# Patient Record
Sex: Female | Born: 1965 | Race: White | Hispanic: No | Smoking: Never smoker
Health system: Southern US, Community
[De-identification: ages and names within clinical notes are randomized; demographics above are authoritative.]

## PROBLEM LIST (undated history)

## (undated) DIAGNOSIS — J309 Allergic rhinitis, unspecified: Secondary | ICD-10-CM

## (undated) DIAGNOSIS — F419 Anxiety disorder, unspecified: Secondary | ICD-10-CM

## (undated) DIAGNOSIS — L501 Idiopathic urticaria: Secondary | ICD-10-CM

## (undated) DIAGNOSIS — G629 Polyneuropathy, unspecified: Secondary | ICD-10-CM

## (undated) DIAGNOSIS — M1990 Unspecified osteoarthritis, unspecified site: Secondary | ICD-10-CM

## (undated) DIAGNOSIS — E282 Polycystic ovarian syndrome: Secondary | ICD-10-CM

## (undated) DIAGNOSIS — IMO0002 Reserved for concepts with insufficient information to code with codable children: Secondary | ICD-10-CM

## (undated) DIAGNOSIS — T753XXA Motion sickness, initial encounter: Secondary | ICD-10-CM

## (undated) DIAGNOSIS — Z9889 Other specified postprocedural states: Secondary | ICD-10-CM

## (undated) DIAGNOSIS — T4145XA Adverse effect of unspecified anesthetic, initial encounter: Secondary | ICD-10-CM

## (undated) DIAGNOSIS — Z8659 Personal history of other mental and behavioral disorders: Secondary | ICD-10-CM

## (undated) DIAGNOSIS — R42 Dizziness and giddiness: Secondary | ICD-10-CM

## (undated) DIAGNOSIS — R600 Localized edema: Secondary | ICD-10-CM

## (undated) DIAGNOSIS — L4050 Arthropathic psoriasis, unspecified: Secondary | ICD-10-CM

## (undated) DIAGNOSIS — Z8489 Family history of other specified conditions: Secondary | ICD-10-CM

## (undated) DIAGNOSIS — M109 Gout, unspecified: Secondary | ICD-10-CM

## (undated) DIAGNOSIS — R011 Cardiac murmur, unspecified: Secondary | ICD-10-CM

## (undated) DIAGNOSIS — L509 Urticaria, unspecified: Secondary | ICD-10-CM

## (undated) DIAGNOSIS — E785 Hyperlipidemia, unspecified: Secondary | ICD-10-CM

## (undated) DIAGNOSIS — D649 Anemia, unspecified: Secondary | ICD-10-CM

## (undated) DIAGNOSIS — K219 Gastro-esophageal reflux disease without esophagitis: Secondary | ICD-10-CM

## (undated) DIAGNOSIS — R609 Edema, unspecified: Secondary | ICD-10-CM

## (undated) DIAGNOSIS — Z87442 Personal history of urinary calculi: Secondary | ICD-10-CM

## (undated) DIAGNOSIS — R112 Nausea with vomiting, unspecified: Secondary | ICD-10-CM

## (undated) DIAGNOSIS — I059 Rheumatic mitral valve disease, unspecified: Secondary | ICD-10-CM

## (undated) DIAGNOSIS — K259 Gastric ulcer, unspecified as acute or chronic, without hemorrhage or perforation: Secondary | ICD-10-CM

## (undated) DIAGNOSIS — G43909 Migraine, unspecified, not intractable, without status migrainosus: Secondary | ICD-10-CM

## (undated) DIAGNOSIS — F329 Major depressive disorder, single episode, unspecified: Secondary | ICD-10-CM

## (undated) DIAGNOSIS — C801 Malignant (primary) neoplasm, unspecified: Secondary | ICD-10-CM

## (undated) DIAGNOSIS — D039 Melanoma in situ, unspecified: Secondary | ICD-10-CM

## (undated) DIAGNOSIS — E063 Autoimmune thyroiditis: Secondary | ICD-10-CM

## (undated) DIAGNOSIS — E039 Hypothyroidism, unspecified: Secondary | ICD-10-CM

## (undated) DIAGNOSIS — E538 Deficiency of other specified B group vitamins: Secondary | ICD-10-CM

## (undated) DIAGNOSIS — I10 Essential (primary) hypertension: Secondary | ICD-10-CM

## (undated) DIAGNOSIS — T8859XA Other complications of anesthesia, initial encounter: Secondary | ICD-10-CM

## (undated) DIAGNOSIS — E079 Disorder of thyroid, unspecified: Secondary | ICD-10-CM

## (undated) DIAGNOSIS — T7840XA Allergy, unspecified, initial encounter: Secondary | ICD-10-CM

## (undated) DIAGNOSIS — F32A Depression, unspecified: Secondary | ICD-10-CM

## (undated) HISTORY — DX: Disorder of thyroid, unspecified: E07.9

## (undated) HISTORY — PX: KNEE ARTHROSCOPY: SHX127

## (undated) HISTORY — DX: Major depressive disorder, single episode, unspecified: F32.9

## (undated) HISTORY — DX: Gastro-esophageal reflux disease without esophagitis: K21.9

## (undated) HISTORY — DX: Polyneuropathy, unspecified: G62.9

## (undated) HISTORY — DX: Hyperlipidemia, unspecified: E78.5

## (undated) HISTORY — DX: Gastric ulcer, unspecified as acute or chronic, without hemorrhage or perforation: K25.9

## (undated) HISTORY — DX: Edema, unspecified: R60.9

## (undated) HISTORY — DX: Essential (primary) hypertension: I10

## (undated) HISTORY — DX: Cardiac murmur, unspecified: R01.1

## (undated) HISTORY — DX: Melanoma in situ, unspecified: D03.9

## (undated) HISTORY — DX: Migraine, unspecified, not intractable, without status migrainosus: G43.909

## (undated) HISTORY — DX: Unspecified osteoarthritis, unspecified site: M19.90

## (undated) HISTORY — DX: Anxiety disorder, unspecified: F41.9

## (undated) HISTORY — DX: Malignant (primary) neoplasm, unspecified: C80.1

## (undated) HISTORY — DX: Allergy, unspecified, initial encounter: T78.40XA

## (undated) HISTORY — DX: Depression, unspecified: F32.A

## (undated) HISTORY — DX: Hypothyroidism, unspecified: E03.9

## (undated) HISTORY — DX: Deficiency of other specified B group vitamins: E53.8

## (undated) HISTORY — DX: Polycystic ovarian syndrome: E28.2

## (undated) HISTORY — PX: ROTATOR CUFF REPAIR: SHX139

## (undated) HISTORY — DX: Personal history of other mental and behavioral disorders: Z86.59

## (undated) HISTORY — DX: Reserved for concepts with insufficient information to code with codable children: IMO0002

---

## 1992-04-01 HISTORY — PX: FOOT SURGERY: SHX648

## 2001-04-01 HISTORY — PX: CHOLECYSTECTOMY: SHX55

## 2003-04-08 ENCOUNTER — Other Ambulatory Visit: Admission: RE | Admit: 2003-04-08 | Discharge: 2003-04-08 | Payer: Self-pay | Admitting: Obstetrics and Gynecology

## 2003-09-02 ENCOUNTER — Other Ambulatory Visit: Admission: RE | Admit: 2003-09-02 | Discharge: 2003-09-02 | Payer: Self-pay | Admitting: Obstetrics and Gynecology

## 2004-08-22 ENCOUNTER — Ambulatory Visit: Payer: Self-pay

## 2005-01-16 ENCOUNTER — Ambulatory Visit: Payer: Self-pay

## 2005-04-01 HISTORY — PX: BREAST BIOPSY: SHX20

## 2006-07-23 ENCOUNTER — Ambulatory Visit: Payer: Self-pay | Admitting: Internal Medicine

## 2006-11-02 ENCOUNTER — Ambulatory Visit: Payer: Self-pay | Admitting: Emergency Medicine

## 2006-11-06 ENCOUNTER — Ambulatory Visit: Payer: Self-pay | Admitting: Obstetrics and Gynecology

## 2006-11-17 ENCOUNTER — Ambulatory Visit: Payer: Self-pay | Admitting: Obstetrics and Gynecology

## 2007-05-12 ENCOUNTER — Ambulatory Visit: Payer: Self-pay | Admitting: General Surgery

## 2007-08-17 ENCOUNTER — Ambulatory Visit: Payer: Self-pay | Admitting: Family Medicine

## 2007-11-19 ENCOUNTER — Ambulatory Visit: Payer: Self-pay | Admitting: Obstetrics and Gynecology

## 2008-04-01 HISTORY — PX: LAPAROSCOPIC GASTRIC BANDING: SHX1100

## 2008-04-14 ENCOUNTER — Ambulatory Visit: Payer: Self-pay | Admitting: General Practice

## 2008-05-20 ENCOUNTER — Ambulatory Visit: Payer: Self-pay | Admitting: Orthopedic Surgery

## 2008-05-20 ENCOUNTER — Ambulatory Visit: Payer: Self-pay | Admitting: Cardiology

## 2008-05-26 ENCOUNTER — Ambulatory Visit: Payer: Self-pay | Admitting: Orthopedic Surgery

## 2008-05-31 ENCOUNTER — Encounter: Payer: Self-pay | Admitting: Orthopedic Surgery

## 2008-08-24 ENCOUNTER — Ambulatory Visit: Payer: Self-pay | Admitting: Family Medicine

## 2008-12-14 ENCOUNTER — Ambulatory Visit: Payer: Self-pay | Admitting: Obstetrics and Gynecology

## 2009-04-01 HISTORY — PX: GASTRIC BYPASS: SHX52

## 2009-10-03 ENCOUNTER — Ambulatory Visit: Payer: Self-pay | Admitting: Nurse Practitioner

## 2010-02-14 ENCOUNTER — Ambulatory Visit: Payer: Self-pay | Admitting: Obstetrics and Gynecology

## 2010-04-19 LAB — BASIC METABOLIC PANEL
Potassium: 3.9 mmol/L (ref 3.4–5.3)
Sodium: 137 mmol/L (ref 137–147)

## 2011-02-27 ENCOUNTER — Ambulatory Visit: Payer: Self-pay | Admitting: Obstetrics and Gynecology

## 2011-03-11 ENCOUNTER — Ambulatory Visit: Payer: Self-pay | Admitting: Obstetrics and Gynecology

## 2011-07-19 DIAGNOSIS — Z91018 Allergy to other foods: Secondary | ICD-10-CM | POA: Insufficient documentation

## 2012-03-03 ENCOUNTER — Ambulatory Visit: Payer: Self-pay | Admitting: Obstetrics and Gynecology

## 2012-07-28 ENCOUNTER — Encounter: Payer: Self-pay | Admitting: Internal Medicine

## 2012-07-31 ENCOUNTER — Ambulatory Visit (INDEPENDENT_AMBULATORY_CARE_PROVIDER_SITE_OTHER): Payer: 59 | Admitting: Internal Medicine

## 2012-07-31 ENCOUNTER — Encounter: Payer: Self-pay | Admitting: Internal Medicine

## 2012-07-31 VITALS — BP 110/70 | HR 95 | Temp 98.5°F | Ht 67.0 in | Wt 219.8 lb

## 2012-07-31 DIAGNOSIS — Z9109 Other allergy status, other than to drugs and biological substances: Secondary | ICD-10-CM

## 2012-07-31 DIAGNOSIS — I10 Essential (primary) hypertension: Secondary | ICD-10-CM

## 2012-07-31 DIAGNOSIS — E790 Hyperuricemia without signs of inflammatory arthritis and tophaceous disease: Secondary | ICD-10-CM

## 2012-07-31 DIAGNOSIS — R7989 Other specified abnormal findings of blood chemistry: Secondary | ICD-10-CM

## 2012-07-31 DIAGNOSIS — E039 Hypothyroidism, unspecified: Secondary | ICD-10-CM

## 2012-07-31 DIAGNOSIS — D039 Melanoma in situ, unspecified: Secondary | ICD-10-CM

## 2012-07-31 DIAGNOSIS — E7800 Pure hypercholesterolemia, unspecified: Secondary | ICD-10-CM

## 2012-07-31 DIAGNOSIS — E282 Polycystic ovarian syndrome: Secondary | ICD-10-CM

## 2012-07-31 DIAGNOSIS — F329 Major depressive disorder, single episode, unspecified: Secondary | ICD-10-CM

## 2012-07-31 DIAGNOSIS — G43909 Migraine, unspecified, not intractable, without status migrainosus: Secondary | ICD-10-CM

## 2012-07-31 DIAGNOSIS — R5381 Other malaise: Secondary | ICD-10-CM

## 2012-07-31 DIAGNOSIS — Z9884 Bariatric surgery status: Secondary | ICD-10-CM

## 2012-07-31 DIAGNOSIS — R5383 Other fatigue: Secondary | ICD-10-CM

## 2012-07-31 DIAGNOSIS — K219 Gastro-esophageal reflux disease without esophagitis: Secondary | ICD-10-CM

## 2012-07-31 DIAGNOSIS — C439 Malignant melanoma of skin, unspecified: Secondary | ICD-10-CM

## 2012-08-02 ENCOUNTER — Encounter: Payer: Self-pay | Admitting: Internal Medicine

## 2012-08-02 DIAGNOSIS — E039 Hypothyroidism, unspecified: Secondary | ICD-10-CM | POA: Insufficient documentation

## 2012-08-02 DIAGNOSIS — E7800 Pure hypercholesterolemia, unspecified: Secondary | ICD-10-CM | POA: Insufficient documentation

## 2012-08-02 DIAGNOSIS — G43909 Migraine, unspecified, not intractable, without status migrainosus: Secondary | ICD-10-CM | POA: Insufficient documentation

## 2012-08-02 DIAGNOSIS — K219 Gastro-esophageal reflux disease without esophagitis: Secondary | ICD-10-CM | POA: Insufficient documentation

## 2012-08-02 DIAGNOSIS — F320 Major depressive disorder, single episode, mild: Secondary | ICD-10-CM | POA: Insufficient documentation

## 2012-08-02 DIAGNOSIS — Z980 Intestinal bypass and anastomosis status: Secondary | ICD-10-CM | POA: Insufficient documentation

## 2012-08-02 DIAGNOSIS — D039 Melanoma in situ, unspecified: Secondary | ICD-10-CM | POA: Insufficient documentation

## 2012-08-02 DIAGNOSIS — Z9109 Other allergy status, other than to drugs and biological substances: Secondary | ICD-10-CM | POA: Insufficient documentation

## 2012-08-02 DIAGNOSIS — E282 Polycystic ovarian syndrome: Secondary | ICD-10-CM | POA: Insufficient documentation

## 2012-08-02 DIAGNOSIS — I10 Essential (primary) hypertension: Secondary | ICD-10-CM | POA: Insufficient documentation

## 2012-08-02 NOTE — Assessment & Plan Note (Signed)
On omeprazole.  

## 2012-08-02 NOTE — Assessment & Plan Note (Signed)
Top weight was 307.  Continue diet adjustment and exercise.  Follow.    

## 2012-08-02 NOTE — Progress Notes (Signed)
Subjective:    Patient ID: Lori Le, female    DOB: May 01, 1965, 47 y.o.   MRN: [id number]  HPI 47 year old female with past history of hypercholesterolemia, hypothyroidism and depression.  She has a documented history of hypertension, but states her blood pressure has been doing well lately.  Is on two fluid pills for "fluid accumulation".  States she has tried to get off of these and has issues with swelling - when off these medications.  She comes in today to follow up on these issues as well as to establish care.  Previously has seen Dr Mickey Farber.  Sees Dr Logan Bores for her GYN exams.  Last pelvic/pap 12/2011.  Previously had an abnormal pap.  Is s/p colposcopy.  States everything checked out fine.  Pap smears since have been normal.  Sees Dr Renae Fickle for her thyroid issues.  Has issues with allergies.  Has seen Dr Jenne Campus.  Takes zyrtec prn.  Has had allergy testing.  She has a history of migraines.  Has visual auras (tunnel vision and bright lights).  Will then notice headache about 20 minutes later.  These are doing better.  Not a significant issue for her now.  She has had gastric bypass.  Initially had lap band.  Had persistent emesis, so band removed and she had bypass.  Top weight was 307 pounds.  She does exercise.  Walks.  Her weight fluctuates.  A history of depression.  Doing well now.  Overall she feels she is doing relatively well.     Past Medical History  Diagnosis Date  . Hypertension   . Hyperlipidemia   . Allergy   . GERD (gastroesophageal reflux disease)   . Arthritis   . Melanoma in situ     left shoulder  . Depression   . History of eating disorder   . Migraine   . Hypothyroidism   . PCOS (polycystic ovarian syndrome)     Outpatient Encounter Prescriptions as of 07/31/2012  Medication Sig Dispense Refill  . furosemide (LASIX) 40 MG tablet Take 40 mg by mouth daily.      [name] levothyroxine (SYNTHROID, LEVOTHROID) 150 MCG tablet Take 150 mcg by mouth daily. Take one a day,  & two on Sunday      . norgestimate-ethinyl estradiol (ORTHO-CYCLEN,SPRINTEC,PREVIFEM) 0.25-35 MG-MCG tablet Take 1 tablet by mouth daily.      [name] omeprazole (PRILOSEC) 20 MG capsule Take 20 mg by mouth daily.      [name] triamterene-hydrochlorothiazide (MAXZIDE-25) 37.5-25 MG per tablet Take 1 tablet by mouth daily.      . Vitamin D, Ergocalciferol, (DRISDOL) 50000 UNITS CAPS Take 50,000 Units by mouth every 30 (thirty) days.       No facility-administered encounter medications on file as of 07/31/2012.    Review of Systems Patient denies any headache, lightheadedness or dizziness now.  Does have a history of migraines as outlined.  No chest pain, tightness or palpitations.  No cardiac symptoms with increased activity or exertion.  No increased shortness of breath, cough or congestion.  Allergy symptoms relatively well controlled currently.   No nausea or vomiting.  Takes omeprazole.  No increased acid reflux reported.  No abdominal pain or cramping.  No bowel change, such as diarrhea, constipation, BRBPR or melana.  No urine change.  No significant depression.        Objective:   Physical Exam Filed Vitals:   07/31/12 1428  BP: 110/70  Pulse: 95  Temp: 98.5 F (36.9 C)  Pulse recheck 68, 23  47 year old female in no acute distress.   HEENT:  Nares- clear.  Oropharynx - without lesions. NECK:  Supple.  Nontender.  No audible bruit.  HEART:  Appears to be regular. LUNGS:  No crackles or wheezing audible.  Respirations even and unlabored.  RADIAL PULSE:  Equal bilaterally.  BREASTS:  Performed by GYN.  ABDOMEN:  Soft, nontender.  Bowel sounds present and normal.  No audible abdominal bruit.  GU:  Performed by Dr Logan Bores.  EXTREMITIES:  No increased edema present.  DP pulses palpable and equal bilaterally.      NEURO:  No focal neuro deficits noted.      Assessment & Plan:  ELEVATED URIC ACID LEVEL.  Noted on recent labs.  She is on triam/hctz.  Never had gout.  Recheck.    HEALTH  MAINTENANCE.  Had her breast, pelvic and pap smear through GYN (Dr Logan Bores).  Last pap 12/11/11.  States was ok.  Had her mammogram 12/13 - ok (per her report).    I spent 45 minutes with this patient and more than 50% of the time was spent in consultation regarding the above.

## 2012-08-02 NOTE — Assessment & Plan Note (Signed)
On synthroid.  Sees Dr Paul.  Follow.   

## 2012-08-02 NOTE — Assessment & Plan Note (Signed)
Low cholesterol diet and exercise.  Check lipid panel with next labs.  

## 2012-08-02 NOTE — Assessment & Plan Note (Signed)
Doing well on no medication.  Follow.  

## 2012-08-02 NOTE — Assessment & Plan Note (Signed)
States her blood pressure has improved since her weight loss.  On Triam/HCTZ and lasix.  Check metabolic panel.  Follow.

## 2012-08-02 NOTE — Assessment & Plan Note (Signed)
Has migraines as outlined.  Not a significant issue for her now.  Follow.   

## 2012-08-02 NOTE — Assessment & Plan Note (Signed)
Followed by Dr Cheree Ditto.  Up to date with skin checks.

## 2012-08-02 NOTE — Assessment & Plan Note (Signed)
Doing well.  Takes zyrtec prn.  Has seen Dr McQueen.   

## 2012-08-02 NOTE — Assessment & Plan Note (Signed)
On Sprintec.  Followed by GYN.

## 2012-08-13 ENCOUNTER — Other Ambulatory Visit (INDEPENDENT_AMBULATORY_CARE_PROVIDER_SITE_OTHER): Payer: 59

## 2012-08-13 DIAGNOSIS — E790 Hyperuricemia without signs of inflammatory arthritis and tophaceous disease: Secondary | ICD-10-CM

## 2012-08-13 DIAGNOSIS — E7800 Pure hypercholesterolemia, unspecified: Secondary | ICD-10-CM

## 2012-08-13 DIAGNOSIS — R7989 Other specified abnormal findings of blood chemistry: Secondary | ICD-10-CM

## 2012-08-13 DIAGNOSIS — R5383 Other fatigue: Secondary | ICD-10-CM

## 2012-08-13 LAB — CBC WITH DIFFERENTIAL/PLATELET
Basophils Relative: 0.5 % (ref 0.0–3.0)
Eosinophils Relative: 1.1 % (ref 0.0–5.0)
HCT: 36.4 % (ref 36.0–46.0)
Lymphs Abs: 2.2 10*3/uL (ref 0.7–4.0)
Monocytes Relative: 6.2 % (ref 3.0–12.0)
Neutrophils Relative %: 63.8 % (ref 43.0–77.0)
Platelets: 235 10*3/uL (ref 150.0–400.0)
RBC: 4.37 Mil/uL (ref 3.87–5.11)
WBC: 7.7 10*3/uL (ref 4.5–10.5)

## 2012-08-13 LAB — COMPREHENSIVE METABOLIC PANEL
Albumin: 3.5 g/dL (ref 3.5–5.2)
CO2: 32 mEq/L (ref 19–32)
Calcium: 8.8 mg/dL (ref 8.4–10.5)
GFR: 114.11 mL/min (ref >60.00)
Glucose, Bld: 83 mg/dL (ref 70–99)
Potassium: 3.9 mEq/L (ref 3.5–5.1)
Sodium: 139 mEq/L (ref 135–145)
Total Protein: 6.4 g/dL (ref 6.0–8.3)

## 2012-08-13 LAB — LIPID PANEL: Cholesterol: 184 mg/dL (ref 0–200)

## 2012-08-16 ENCOUNTER — Other Ambulatory Visit: Payer: Self-pay | Admitting: Internal Medicine

## 2012-08-16 NOTE — Progress Notes (Signed)
Opened in error

## 2012-08-18 ENCOUNTER — Telehealth: Payer: Self-pay | Admitting: Internal Medicine

## 2012-08-18 NOTE — Telephone Encounter (Signed)
Pt informed to increase HCTZ to a whole tablet & that she will need to be evaluated if swelling persist or if she has other sx's

## 2012-08-18 NOTE — Telephone Encounter (Signed)
The note to decrease her triam/hctz is on her lab result note.  I am ok if she increases her triam/hctz back to one whole tablet per day, but if leg swelling - needs eval.  If any acute sx, redness, tenderness, sob - needs eval.  Will also need f/u with me to discuss changing her meds.

## 2012-08-18 NOTE — Telephone Encounter (Signed)
Patient Information:  Caller Name: Mystie  Phone: [phone]  Patient: Lori Le  Gender: Female  DOB: [birth year]  Age: 47 Years  PCP: Dale Duncan  Pregnant: No  Office Follow Up:  Does the office need to follow up with this patient?: Yes  Instructions For The Office: PLEASE CALL PT TODAY AND LET HER KNOW IF SHE SHOULD RESUME 1 WHOLE TRIAM/Hctz A DAY BECAUSE OF SWELLING. COULD NOT VERIFY SWITCH TO LOWER DOSE IN EPIC.  RN Note:  PLEASE CALL PT TODAY AND LET HER KNOW IF SHE SHOULD RESUME 1 WHOLE TRIAM/Hctz A DAY BECAUSE OF SWELLING. COULD NOT VERIFY SWITCH TO LOWER DOSE IN EPIC.  Symptoms  Reason For Call & Symptoms: Pt calling to ask if she can continue whole HCTZ , instead of half that MD changed her to on 08/17/12, because of the excess fluid in lower extremities. No note in Epic of this change. Pt states she had a 6-7 pt wt gain 5/19 pm from that am and pitting edema was "Bad". Much better this am (5/20) after elevating, but wt is still 1 lb more than 5/19 am. Pt denies any symptoms: chest pain, SOB, or pain in extremities.  Reviewed Health History In EMR: Yes  Reviewed Medications In EMR: Yes  Reviewed Allergies In EMR: Yes  Reviewed Surgeries / Procedures: Yes  Date of Onset of Symptoms: 08/17/2012  Treatments Tried: elevated feet  Treatments Tried Worked: Yes OB / GYN:  LMP: 07/19/2012  Guideline(s) Used:  Leg Swelling and Edema  Disposition Per Guideline:   See Today in Office  Reason For Disposition Reached:   Moderate swelling of both ankles (e.g., swelling extends up to the knees) AND new onset or worsening  Advice Given:  Call Back If:  Swelling becomes worse  Swelling becomes red or painful to the touch  Calf pain occurs and becomes constant  You become worse.  Patient Will Follow Care Advice:  YES

## 2012-08-28 ENCOUNTER — Encounter: Payer: Self-pay | Admitting: Internal Medicine

## 2012-08-28 ENCOUNTER — Ambulatory Visit (INDEPENDENT_AMBULATORY_CARE_PROVIDER_SITE_OTHER): Payer: 59 | Admitting: Internal Medicine

## 2012-08-28 VITALS — BP 120/70 | HR 76 | Temp 98.8°F | Ht 67.0 in | Wt 219.2 lb

## 2012-08-28 DIAGNOSIS — D039 Melanoma in situ, unspecified: Secondary | ICD-10-CM

## 2012-08-28 DIAGNOSIS — G43909 Migraine, unspecified, not intractable, without status migrainosus: Secondary | ICD-10-CM

## 2012-08-28 DIAGNOSIS — Z9884 Bariatric surgery status: Secondary | ICD-10-CM

## 2012-08-28 DIAGNOSIS — I10 Essential (primary) hypertension: Secondary | ICD-10-CM

## 2012-08-28 DIAGNOSIS — E039 Hypothyroidism, unspecified: Secondary | ICD-10-CM

## 2012-08-28 DIAGNOSIS — C439 Malignant melanoma of skin, unspecified: Secondary | ICD-10-CM

## 2012-08-28 DIAGNOSIS — F329 Major depressive disorder, single episode, unspecified: Secondary | ICD-10-CM

## 2012-08-28 MED ORDER — FUROSEMIDE 40 MG PO TABS: 40.0000 mg | ORAL_TABLET | Freq: Every day | ORAL | Status: DC

## 2012-08-28 MED ORDER — TRIAMTERENE-HCTZ 37.5-25 MG PO TABS: 1.0000 | ORAL_TABLET | Freq: Every day | ORAL | Status: DC

## 2012-08-28 MED ORDER — OMEPRAZOLE 20 MG PO CPDR: 20.0000 mg | DELAYED_RELEASE_CAPSULE | Freq: Every day | ORAL | Status: DC

## 2012-08-30 ENCOUNTER — Encounter: Payer: Self-pay | Admitting: Internal Medicine

## 2012-08-30 NOTE — Progress Notes (Signed)
Subjective:    Patient ID: Lori Le, female    DOB: Jun 14, 1965, 47 y.o.   MRN: [id number]  HPI 47 year old female with past history of hypercholesterolemia, hypothyroidism and depression.  She has a documented history of hypertension, but states her blood pressure has been doing well lately.  Is on two fluid pills for "fluid accumulation".  States she has tried to get off of these and has issues with swelling - when off these medications.  She comes in today for a scheduled follow up.  Last visit, I tried to cut back her triam/hctz secondary to an elevated uric acid level.  She reported increased swelling only after a few days of being off the medication.  Back on now.  She went to [employer] with [employer] and had a massage for lymphedema.  She states that she noticed decreased swelling and increased urine output after the massage.  Feels better.  Planning to go for another massage today.   Sees Dr Logan Bores for her GYN exams.  Last pelvic/pap 12/2011.  Previously had an abnormal pap.  Is s/p colposcopy.  States everything checked out fine.  Pap smears since have been normal.  Sees Dr Renae Fickle for her thyroid issues.  Has issues with allergies.  Has seen Dr Jenne Campus.  Takes zyrtec prn.  Has had allergy testing.  She has a history of migraines.  Has visual auras (tunnel vision and bright lights).  Will then notice headache about 20 minutes later.  These are doing better.  Not a significant issue for her now.  She has had gastric bypass.  Initially had lap band.  Had persistent emesis, so band removed and she had bypass.  Top weight was 307 pounds.  She does exercise.  Walks.   A history of depression.  Doing well now.  Overall she feels she is doing relatively well.     Past Medical History  Diagnosis Date  . Hypertension   . Hyperlipidemia   . Allergy   . GERD (gastroesophageal reflux disease)   . Arthritis   . Melanoma in situ     left shoulder  . Depression   . History of eating  disorder   . Migraine   . Hypothyroidism   . PCOS (polycystic ovarian syndrome)     Outpatient Encounter Prescriptions as of 08/28/2012  Medication Sig Dispense Refill  . furosemide (LASIX) 40 MG tablet Take 1 tablet (40 mg total) by mouth daily.  90 tablet  1  . levothyroxine (SYNTHROID, LEVOTHROID) 150 MCG tablet Take 150 mcg by mouth daily. Take one a day, & two on Sunday      . norgestimate-ethinyl estradiol (ORTHO-CYCLEN,SPRINTEC,PREVIFEM) 0.25-35 MG-MCG tablet Take 1 tablet by mouth daily.      [name] omeprazole (PRILOSEC) 20 MG capsule Take 1 capsule (20 mg total) by mouth daily.  90 capsule  1  . triamterene-hydrochlorothiazide (MAXZIDE-25) 37.5-25 MG per tablet Take 1 tablet by mouth daily.  90 tablet  1  . Vitamin D, Ergocalciferol, (DRISDOL) 50000 UNITS CAPS Take 50,000 Units by mouth every 30 (thirty) days.      . [DISCONTINUED] furosemide (LASIX) 40 MG tablet Take 40 mg by mouth daily.      . [DISCONTINUED] omeprazole (PRILOSEC) 20 MG capsule Take 20 mg by mouth daily.      . [DISCONTINUED] triamterene-hydrochlorothiazide (MAXZIDE-25) 37.5-25 MG per tablet Take 1 tablet by mouth daily.       No facility-administered encounter medications on file as  of 08/28/2012.    Review of Systems Patient denies any headache, lightheadedness or dizziness now.  Does have a history of migraines as outlined.  No chest pain, tightness or palpitations.  No cardiac symptoms with increased activity or exertion.  No increased shortness of breath, cough or congestion.  Allergy symptoms relatively well controlled currently.   No nausea or vomiting.  Takes omeprazole.  No increased acid reflux reported.  No abdominal pain or cramping.  No bowel change, such as diarrhea, constipation, BRBPR or melana.  No urine change.  No significant depression.  Swelling better now as outlined.        Objective:   Physical Exam  Filed Vitals:   08/28/12 1619  BP: 120/70  Pulse: 76  Temp: 98.8 F (37.1 C)   47 year  old female in no acute distress.   HEENT:  Nares- clear.  Oropharynx - without lesions. NECK:  Supple.  Nontender.  No audible bruit.  HEART:  Appears to be regular. LUNGS:  No crackles or wheezing audible.  Respirations even and unlabored.  RADIAL PULSE:  Equal bilaterally.  ABDOMEN:  Soft, nontender.  Bowel sounds present and normal.  No audible abdominal bruit.  EXTREMITIES:  No increased edema present.  DP pulses palpable and equal bilaterally.          Assessment & Plan:  ELEVATED URIC ACID LEVEL.  Noted on recent labs.  She is on triam/hctz.  Never had gout.  Given that she had significant improvement with the massage, will try to decrease the triam/hctz to 1/2 tablet again.  Follow.  If problems, may need to try and use lasix instead and adjust dosing.     HEALTH MAINTENANCE.  Had her breast, pelvic and pap smear through GYN (Dr Logan Bores).  Last pap 12/11/11.  States was ok.  Had her mammogram 12/13 - ok (per her report).

## 2012-08-30 NOTE — Assessment & Plan Note (Signed)
Top weight was 307.  Continue diet adjustment and exercise.  Follow.

## 2012-08-30 NOTE — Assessment & Plan Note (Signed)
On synthroid.  Sees Dr Paul.  Follow.   

## 2012-08-30 NOTE — Assessment & Plan Note (Signed)
Has migraines as outlined.  Not a significant issue for her now.  Follow.   

## 2012-08-30 NOTE — Assessment & Plan Note (Signed)
States her blood pressure has improved since her weight loss.  On Triam/HCTZ and lasix.  Adjust medication as outlined.  Follow.

## 2012-08-30 NOTE — Assessment & Plan Note (Addendum)
Followed by Dr Cheree Ditto.  Up to date with skin checks.  Will discuss with her at her next visit if cxr,etc warranted.

## 2012-08-30 NOTE — Assessment & Plan Note (Signed)
Doing well on no medication.  Follow.  

## 2012-12-16 ENCOUNTER — Encounter: Payer: Self-pay | Admitting: Adult Health

## 2012-12-16 ENCOUNTER — Ambulatory Visit (INDEPENDENT_AMBULATORY_CARE_PROVIDER_SITE_OTHER): Payer: 59 | Admitting: Adult Health

## 2012-12-16 VITALS — BP 120/78 | HR 88 | Temp 98.2°F | Resp 12 | Ht 67.0 in | Wt 224.0 lb

## 2012-12-16 DIAGNOSIS — J329 Chronic sinusitis, unspecified: Secondary | ICD-10-CM

## 2012-12-16 MED ORDER — PSEUDOEPHEDRINE-GUAIFENESIN ER 60-600 MG PO TB12: 1.0000 | ORAL_TABLET | Freq: Two times a day (BID) | ORAL | Status: DC

## 2012-12-16 MED ORDER — AMOXICILLIN-POT CLAVULANATE 875-125 MG PO TABS: 1.0000 | ORAL_TABLET | Freq: Two times a day (BID) | ORAL | Status: DC

## 2012-12-16 NOTE — Assessment & Plan Note (Signed)
Start Augmentin one tablet twice a day for 10 days. Mucinex D. as directed. RTC if symptoms are not improved within 3-4 days.

## 2012-12-16 NOTE — Patient Instructions (Addendum)
Start Augmenting twice daily for 10 days.  Mucinex D as directed.   Sinusitis is a condition that can cause a stuffy nose, pain in the face, and yellow or green discharge (mucus) from the nose. The sinuses are hollow areas in the bones of the face. They have a thin lining that normally makes a small amount of mucus. When this lining gets infected, it swells and makes extra mucus. This causes symptoms.   Sinusitis can occur when a person gets sick with a cold. The germs causing the cold can also infect the sinuses. Many times, a person feels like his or her cold is getting better. But then he or she gets sinusitis and begins to feel sick again.  What are the symptoms of sinusitis? - Common symptoms of sinusitis include:  Stuffy or blocked nose  Thick yellow or green discharge from the nose  Pain in the teeth  Pain or pressure in the face - This often feels worse when a person bends forward.   People with sinusitis can also have other symptoms that include:  Fever  Cough  Trouble smelling  Ear pressure or fullness  Headache  Bad breath  Feeling tired   Most of the time, symptoms start to improve in 7 to 10 days.  See your doctor or nurse if your symptoms last more than 7 days, or if your symptoms get better at first but then get worse.  Sometimes, sinusitis can lead to serious problems. See your doctor or nurse right away (do not wait 7 days) if you have:  Fever higher than 102.75F (39.2C)  Sudden and severe pain in the face and head  Trouble seeing or seeing double  Trouble thinking clearly  Swelling or redness around 1 or both eyes  Trouble breathing or a stiff neck   Is there anything I can do on my own to feel better? - Yes. To reduce your symptoms, you can: Take an over-the-counter pain reliever to reduce the pain  Rinse your nose and sinuses with salt water a few times a day - Ask your doctor or nurse about the best way to do this.  Use a decongestant nose spray -  These sprays are sold in a pharmacy. But do not use decongestant nose sprays for more than 2 to 3 days in a row. Using them more than 3 days in a row can make symptoms worse.   You should NOT take an antihistamine for sinusitis. Common antihistamines include diphenhydramine (sample brand name: Benadryl), chlorpheniramine (sample brand name: Chlor-Trimeton), loratadine (sample brand name: Claritin), and cetirizine (sample brand name: Zyrtec). They can treat allergies, but not sinus infections, and could increase your discomfort by drying the lining of your nose and sinuses, or making you tired.   Your doctor might also prescribe a steroid nose spray to reduce the swelling in your nose. (Steroid nose sprays do not contain the same steroids that athletes take to build muscle.)  How is sinusitis treated? - Most of the time, sinusitis does not need to be treated with antibiotic medicines. This is because most sinusitis is caused by viruses - not bacteria - and antibiotics do not kill viruses. Many people get over sinus infections without antibiotics.  Some people with sinusitis do need treatment with antibiotics. If your symptoms have not improved after 7 to 10 days, ask your doctor if you should take antibiotics. Your doctor might recommend that you wait 1 more week to see if your symptoms improve. But  if you have symptoms such as a fever or a lot of pain, he or she might prescribe antibiotics. It is important to follow your doctor's instructions about taking your antibiotics.

## 2012-12-16 NOTE — Progress Notes (Signed)
  Subjective:    Patient ID: Lori Le, female    DOB: 1965/05/17, 47 y.o.   MRN: [id number]  HPI  Patient presents with sinus congestion, green drainage, sinus pressure, cough, teeth pain. Denies fever or chills. Her husband has also been sick. She has been taking Mucinex and was also taking a antihistamine. Symptoms are persisting and she feels they are worsening.   Current Outpatient Prescriptions on File Prior to Visit  Medication Sig Dispense Refill  . furosemide (LASIX) 40 MG tablet Take 1 tablet (40 mg total) by mouth daily.  90 tablet  1  . levothyroxine (SYNTHROID, LEVOTHROID) 150 MCG tablet Take 150 mcg by mouth daily. Take one a day, & two on Sunday      . norgestimate-ethinyl estradiol (ORTHO-CYCLEN,SPRINTEC,PREVIFEM) 0.25-35 MG-MCG tablet Take 1 tablet by mouth daily.      [name] omeprazole (PRILOSEC) 20 MG capsule Take 1 capsule (20 mg total) by mouth daily.  90 capsule  1  . triamterene-hydrochlorothiazide (MAXZIDE-25) 37.5-25 MG per tablet Take 1 tablet by mouth daily.  90 tablet  1   No current facility-administered medications on file prior to visit.    Review of Systems  Constitutional: Negative for fever and chills.  HENT: Positive for congestion, rhinorrhea, postnasal drip and sinus pressure.   Respiratory: Negative.   Cardiovascular: Negative.   Gastrointestinal: Negative.       BP 120/78  Pulse 88  Temp(Src) 98.2 F (36.8 C) (Oral)  Resp 12  Ht 5\' 7"  (1.702 m)  Wt 224 lb (101.606 kg)  BMI 35.08 kg/m2  SpO2 98%    Objective:   Physical Exam  Constitutional: She is oriented to person, place, and time.  Appears acutely ill with URI  HENT:  Head: Normocephalic and atraumatic.  Pharyngeal erythema. Notable craters on tonsils without exudate. Nasal mucosa injected.  Cardiovascular: Normal rate, regular rhythm, normal heart sounds and intact distal pulses.  Exam reveals no gallop and no friction rub.   No murmur heard. Pulmonary/Chest: Effort normal and  breath sounds normal.  Neurological: She is alert and oriented to person, place, and time.  Psychiatric: She has a normal mood and affect. Her behavior is normal. Judgment and thought content normal.      Assessment & Plan:

## 2012-12-29 ENCOUNTER — Ambulatory Visit (INDEPENDENT_AMBULATORY_CARE_PROVIDER_SITE_OTHER): Payer: 59 | Admitting: Internal Medicine

## 2012-12-29 ENCOUNTER — Encounter: Payer: Self-pay | Admitting: Internal Medicine

## 2012-12-29 VITALS — BP 108/72 | HR 76 | Temp 98.0°F | Resp 12 | Ht 67.0 in | Wt 225.0 lb

## 2012-12-29 DIAGNOSIS — R5383 Other fatigue: Secondary | ICD-10-CM

## 2012-12-29 DIAGNOSIS — E039 Hypothyroidism, unspecified: Secondary | ICD-10-CM

## 2012-12-29 DIAGNOSIS — Z9884 Bariatric surgery status: Secondary | ICD-10-CM

## 2012-12-29 DIAGNOSIS — E282 Polycystic ovarian syndrome: Secondary | ICD-10-CM

## 2012-12-29 DIAGNOSIS — G609 Hereditary and idiopathic neuropathy, unspecified: Secondary | ICD-10-CM

## 2012-12-29 DIAGNOSIS — I10 Essential (primary) hypertension: Secondary | ICD-10-CM

## 2012-12-29 DIAGNOSIS — E7800 Pure hypercholesterolemia, unspecified: Secondary | ICD-10-CM

## 2012-12-29 DIAGNOSIS — K219 Gastro-esophageal reflux disease without esophagitis: Secondary | ICD-10-CM

## 2012-12-29 DIAGNOSIS — G629 Polyneuropathy, unspecified: Secondary | ICD-10-CM

## 2012-12-29 DIAGNOSIS — R609 Edema, unspecified: Secondary | ICD-10-CM

## 2012-12-29 DIAGNOSIS — R5381 Other malaise: Secondary | ICD-10-CM

## 2012-12-29 DIAGNOSIS — R252 Cramp and spasm: Secondary | ICD-10-CM

## 2012-12-29 DIAGNOSIS — G43909 Migraine, unspecified, not intractable, without status migrainosus: Secondary | ICD-10-CM

## 2012-12-30 ENCOUNTER — Encounter: Payer: Self-pay | Admitting: Internal Medicine

## 2012-12-30 DIAGNOSIS — G629 Polyneuropathy, unspecified: Secondary | ICD-10-CM | POA: Insufficient documentation

## 2012-12-30 DIAGNOSIS — R609 Edema, unspecified: Secondary | ICD-10-CM | POA: Insufficient documentation

## 2012-12-30 DIAGNOSIS — R5383 Other fatigue: Secondary | ICD-10-CM | POA: Insufficient documentation

## 2012-12-30 LAB — COMPREHENSIVE METABOLIC PANEL
Albumin: 3.4 g/dL — ABNORMAL LOW (ref 3.5–5.2)
CO2: 28 mEq/L (ref 19–32)
GFR: 116.15 mL/min (ref >60.00)
Glucose, Bld: 92 mg/dL (ref 70–99)
Potassium: 3.4 mEq/L — ABNORMAL LOW (ref 3.5–5.1)
Sodium: 136 mEq/L (ref 135–145)
Total Bilirubin: 0.5 mg/dL (ref 0.3–1.2)
Total Protein: 6.8 g/dL (ref 6.0–8.3)

## 2012-12-30 LAB — VITAMIN B12: Vitamin B-12: 168 pg/mL — ABNORMAL LOW (ref 211–911)

## 2012-12-30 NOTE — Assessment & Plan Note (Signed)
On omeprazole.  

## 2012-12-30 NOTE — Assessment & Plan Note (Signed)
Check routine labs.  Given the increased fatigue, daytime somnolence and feeling as if she has not slept when she awakens - will schedule an in home sleep study.   Further w/up pending.

## 2012-12-30 NOTE — Progress Notes (Signed)
Subjective:    Patient ID: Lori Le, female    DOB: 10/25/1965, 47 y.o.   MRN: [id number]  HPI 47 year old female with past history of hypercholesterolemia, hypothyroidism and depression.  She has a documented history of hypertension, but states her blood pressure has been doing well lately.  Is on two fluid pills for "fluid accumulation".  States she has tried to get off of these and has issues with swelling - when off these medications.  She comes in today for a scheduled follow up.  I tried to cut back her triam/hctz secondary to an elevated uric acid level.  She reported increased swelling only after a few days of being off the medication.  Back on now.  Sees Dr Logan Bores for her GYN exams.  Last pelvic/pap 12/2011.  Previously had an abnormal pap.  Is s/p colposcopy.  States everything checked out fine.  Pap smears since have been normal.  Sees Dr Renae Fickle for her thyroid issues.  Has issues with allergies.  Has seen Dr Jenne Campus.  Takes zyrtec prn.  Has had allergy testing.  She has a history of migraines.  Has visual auras (tunnel vision and bright lights).  Will then notice headache about 20 minutes later.  These are doing better.  Not a significant issue for her now.  She has had gastric bypass.  Initially had lap band.  Had persistent emesis, so band removed and she had bypass.  Top weight was 307 pounds.  She does exercise.  Walks.   A history of depression.  Doing well now.  She reports decreased energy.  Also reports some daytime somnolence and wakes up feeling like she has not slept.  Describes tingling and cramping in her feet and legs.  Tingling involves the bottom of her feet.  Persistent problem for her.     Past Medical History  Diagnosis Date  . Hypertension   . Hyperlipidemia   . Allergy   . GERD (gastroesophageal reflux disease)   . Arthritis   . Melanoma in situ     left shoulder  . Depression   . History of eating disorder   . Migraine   . Hypothyroidism   . PCOS (polycystic  ovarian syndrome)     Outpatient Encounter Prescriptions as of 12/29/2012  Medication Sig Dispense Refill  . furosemide (LASIX) 40 MG tablet Take 1 tablet (40 mg total) by mouth daily.  90 tablet  1  . levothyroxine (SYNTHROID, LEVOTHROID) 150 MCG tablet Take 150 mcg by mouth daily. Take one a day, & two on Sunday      . norgestimate-ethinyl estradiol (ORTHO-CYCLEN,SPRINTEC,PREVIFEM) 0.25-35 MG-MCG tablet Take 1 tablet by mouth daily.      [name] omeprazole (PRILOSEC) 20 MG capsule Take 1 capsule (20 mg total) by mouth daily.  90 capsule  1  . triamterene-hydrochlorothiazide (MAXZIDE-25) 37.5-25 MG per tablet Take 1 tablet by mouth daily.  90 tablet  1  . [DISCONTINUED] amoxicillin-clavulanate (AUGMENTIN) 875-125 MG per tablet Take 1 tablet by mouth 2 (two) times daily.  20 tablet  0  . [DISCONTINUED] pseudoephedrine-guaifenesin (MUCINEX D) 60-600 MG per tablet Take 1 tablet by mouth every 12 (twelve) hours.  30 tablet  0   No facility-administered encounter medications on file as of 12/29/2012.    Review of Systems Patient denies any headache, lightheadedness or dizziness now.  Does have a history of migraines as outlined. Not a significant issues currently.  Increased fatigue.  Increased daytime somnolence.  No chest pain, tightness  or palpitations.  No cardiac symptoms with increased activity or exertion.  No increased shortness of breath, cough or congestion.  Allergy symptoms relatively well controlled currently.   No nausea or vomiting.  Takes omeprazole.  No increased acid reflux reported.  No abdominal pain or cramping.  No bowel change, such as diarrhea, constipation, BRBPR or melana.  No urine change.  No significant depression.  Continued swelling.  States she notices increased lower extremity swelling if she sits for a long period.  Also, leg and foot swelling - better when she wakes up.      Objective:   Physical Exam  Filed Vitals:   12/29/12 1552  BP: 108/72  Pulse: 76  Temp: 98 F  (36.7 C)  Resp: 12   Blood pressure recheck  19/54 48 year old female in no acute distress.   HEENT:  Nares- clear.  Oropharynx - without lesions. NECK:  Supple.  Nontender.  No audible bruit.  HEART:  Appears to be regular. LUNGS:  No crackles or wheezing audible.  Respirations even and unlabored.  RADIAL PULSE:  Equal bilaterally.  ABDOMEN:  Soft, nontender.  Bowel sounds present and normal.  No audible abdominal bruit.  EXTREMITIES:  No increased edema present.  DP pulses palpable and equal bilaterally.          Assessment & Plan:  ELEVATED URIC ACID LEVEL.  Noted on recent labs.  She is on triam/hctz.  Never had gout.  Tried to decrease triam/hctz.  Did not tolerate.  Follow.     HEALTH MAINTENANCE.  Had her breast, pelvic and pap smear through GYN (Dr Logan Bores).  Last pap 12/11/11.  States was ok.  Had her mammogram 12/13 - ok (per her report).

## 2012-12-30 NOTE — Assessment & Plan Note (Signed)
Blood pressure doing well.  Follow.  

## 2012-12-30 NOTE — Assessment & Plan Note (Signed)
Symptoms and exam consistent with peripheral neuropathy.  Check b12 and cbc.  Follow.  Further w/up pending results.

## 2012-12-30 NOTE — Assessment & Plan Note (Signed)
Top weight was 307.  Continue diet adjustment and exercise.  Follow.  Check B12 and cbc.

## 2012-12-30 NOTE — Assessment & Plan Note (Signed)
Has migraines as outlined.  Not a significant issue for her now.  Follow.   

## 2012-12-30 NOTE — Assessment & Plan Note (Signed)
On Sprintec.  Followed by GYN.

## 2012-12-30 NOTE — Assessment & Plan Note (Signed)
Swelling improved some today.  Describes worsening swelling at times.  Worse at the end of the day.  Discussed support hose.  Will refer back to vascular surgery for evaluation.

## 2012-12-30 NOTE — Assessment & Plan Note (Signed)
On synthroid.  Sees Dr Paul.  Follow.   

## 2012-12-30 NOTE — Assessment & Plan Note (Signed)
Low cholesterol diet and exercise.  Follow lipid panel.   

## 2012-12-31 ENCOUNTER — Other Ambulatory Visit: Payer: Self-pay | Admitting: Internal Medicine

## 2012-12-31 ENCOUNTER — Other Ambulatory Visit: Payer: Self-pay | Admitting: *Deleted

## 2012-12-31 DIAGNOSIS — E876 Hypokalemia: Secondary | ICD-10-CM

## 2012-12-31 MED ORDER — POTASSIUM CHLORIDE ER 10 MEQ PO TBCR: 10.0000 meq | EXTENDED_RELEASE_TABLET | Freq: Every day | ORAL | Status: DC

## 2012-12-31 NOTE — Progress Notes (Signed)
Order placed for f/u potassium.  

## 2013-01-01 ENCOUNTER — Ambulatory Visit (INDEPENDENT_AMBULATORY_CARE_PROVIDER_SITE_OTHER): Payer: 59 | Admitting: *Deleted

## 2013-01-01 DIAGNOSIS — E538 Deficiency of other specified B group vitamins: Secondary | ICD-10-CM

## 2013-01-01 MED ORDER — CYANOCOBALAMIN 1000 MCG/ML IJ SOLN
1000.0000 ug | Freq: Once | INTRAMUSCULAR | Status: AC
  Administered 2013-01-01: 1000 ug via INTRAMUSCULAR

## 2013-01-11 ENCOUNTER — Other Ambulatory Visit (INDEPENDENT_AMBULATORY_CARE_PROVIDER_SITE_OTHER): Payer: 59 | Admitting: *Deleted

## 2013-01-11 ENCOUNTER — Other Ambulatory Visit (INDEPENDENT_AMBULATORY_CARE_PROVIDER_SITE_OTHER): Payer: 59

## 2013-01-11 DIAGNOSIS — E538 Deficiency of other specified B group vitamins: Secondary | ICD-10-CM

## 2013-01-11 DIAGNOSIS — E876 Hypokalemia: Secondary | ICD-10-CM

## 2013-01-11 LAB — POTASSIUM: Potassium: 3.7 mEq/L (ref 3.5–5.1)

## 2013-01-11 MED ORDER — CYANOCOBALAMIN 1000 MCG/ML IJ SOLN
1000.0000 ug | Freq: Once | INTRAMUSCULAR | Status: AC
  Administered 2013-01-11: 1000 ug via INTRAMUSCULAR

## 2013-01-12 ENCOUNTER — Other Ambulatory Visit: Payer: Self-pay | Admitting: Internal Medicine

## 2013-01-12 DIAGNOSIS — E876 Hypokalemia: Secondary | ICD-10-CM

## 2013-01-12 NOTE — Progress Notes (Signed)
Order placed for f/u potassium.  

## 2013-01-13 ENCOUNTER — Other Ambulatory Visit: Payer: Self-pay | Admitting: *Deleted

## 2013-01-20 ENCOUNTER — Other Ambulatory Visit: Payer: Self-pay | Admitting: Oncology

## 2013-01-20 ENCOUNTER — Ambulatory Visit (INDEPENDENT_AMBULATORY_CARE_PROVIDER_SITE_OTHER): Payer: 59 | Admitting: *Deleted

## 2013-01-20 DIAGNOSIS — E538 Deficiency of other specified B group vitamins: Secondary | ICD-10-CM

## 2013-01-20 MED ORDER — CYANOCOBALAMIN 1000 MCG/ML IJ SOLN
1000.0000 ug | Freq: Once | INTRAMUSCULAR | Status: AC
  Administered 2013-01-20: 1000 ug via INTRAMUSCULAR

## 2013-01-27 ENCOUNTER — Ambulatory Visit (INDEPENDENT_AMBULATORY_CARE_PROVIDER_SITE_OTHER): Payer: 59 | Admitting: *Deleted

## 2013-01-27 ENCOUNTER — Other Ambulatory Visit (INDEPENDENT_AMBULATORY_CARE_PROVIDER_SITE_OTHER): Payer: 59

## 2013-01-27 DIAGNOSIS — E876 Hypokalemia: Secondary | ICD-10-CM

## 2013-01-27 DIAGNOSIS — E538 Deficiency of other specified B group vitamins: Secondary | ICD-10-CM

## 2013-01-27 MED ORDER — CYANOCOBALAMIN 1000 MCG/ML IJ SOLN
1000.0000 ug | Freq: Once | INTRAMUSCULAR | Status: AC
  Administered 2013-01-27: 1000 ug via INTRAMUSCULAR

## 2013-01-29 ENCOUNTER — Other Ambulatory Visit: Payer: Self-pay | Admitting: *Deleted

## 2013-01-29 ENCOUNTER — Other Ambulatory Visit: Payer: Self-pay | Admitting: Internal Medicine

## 2013-01-29 DIAGNOSIS — E876 Hypokalemia: Secondary | ICD-10-CM

## 2013-01-29 MED ORDER — POTASSIUM CHLORIDE ER 10 MEQ PO TBCR: 10.0000 meq | EXTENDED_RELEASE_TABLET | Freq: Two times a day (BID) | ORAL | Status: DC

## 2013-01-29 NOTE — Progress Notes (Signed)
Order placed for f/u potassium.  

## 2013-02-11 ENCOUNTER — Other Ambulatory Visit (INDEPENDENT_AMBULATORY_CARE_PROVIDER_SITE_OTHER): Payer: 59

## 2013-02-11 DIAGNOSIS — E876 Hypokalemia: Secondary | ICD-10-CM

## 2013-02-12 LAB — POTASSIUM: Potassium: 3.6 mEq/L (ref 3.5–5.1)

## 2013-02-15 ENCOUNTER — Encounter: Payer: Self-pay | Admitting: *Deleted

## 2013-02-23 ENCOUNTER — Encounter: Payer: Self-pay | Admitting: Internal Medicine

## 2013-03-01 ENCOUNTER — Encounter: Payer: Self-pay | Admitting: Internal Medicine

## 2013-03-01 ENCOUNTER — Ambulatory Visit (INDEPENDENT_AMBULATORY_CARE_PROVIDER_SITE_OTHER): Payer: 59 | Admitting: Internal Medicine

## 2013-03-01 VITALS — BP 120/80 | HR 71 | Temp 98.0°F | Ht 67.0 in | Wt 229.2 lb

## 2013-03-01 DIAGNOSIS — K219 Gastro-esophageal reflux disease without esophagitis: Secondary | ICD-10-CM

## 2013-03-01 DIAGNOSIS — F329 Major depressive disorder, single episode, unspecified: Secondary | ICD-10-CM

## 2013-03-01 DIAGNOSIS — G629 Polyneuropathy, unspecified: Secondary | ICD-10-CM

## 2013-03-01 DIAGNOSIS — G43909 Migraine, unspecified, not intractable, without status migrainosus: Secondary | ICD-10-CM

## 2013-03-01 DIAGNOSIS — G609 Hereditary and idiopathic neuropathy, unspecified: Secondary | ICD-10-CM

## 2013-03-01 DIAGNOSIS — E7800 Pure hypercholesterolemia, unspecified: Secondary | ICD-10-CM

## 2013-03-01 DIAGNOSIS — R5383 Other fatigue: Secondary | ICD-10-CM

## 2013-03-01 DIAGNOSIS — Z9884 Bariatric surgery status: Secondary | ICD-10-CM

## 2013-03-01 DIAGNOSIS — R0981 Nasal congestion: Secondary | ICD-10-CM

## 2013-03-01 DIAGNOSIS — J3489 Other specified disorders of nose and nasal sinuses: Secondary | ICD-10-CM

## 2013-03-01 DIAGNOSIS — E538 Deficiency of other specified B group vitamins: Secondary | ICD-10-CM

## 2013-03-01 DIAGNOSIS — R5381 Other malaise: Secondary | ICD-10-CM

## 2013-03-01 DIAGNOSIS — I10 Essential (primary) hypertension: Secondary | ICD-10-CM

## 2013-03-01 DIAGNOSIS — R609 Edema, unspecified: Secondary | ICD-10-CM

## 2013-03-01 DIAGNOSIS — E039 Hypothyroidism, unspecified: Secondary | ICD-10-CM

## 2013-03-01 MED ORDER — TRIAMTERENE-HCTZ 37.5-25 MG PO TABS: 1.0000 | ORAL_TABLET | Freq: Every day | ORAL | Status: DC

## 2013-03-01 MED ORDER — OMEPRAZOLE 20 MG PO CPDR: 20.0000 mg | DELAYED_RELEASE_CAPSULE | Freq: Every day | ORAL | Status: DC

## 2013-03-01 MED ORDER — FUROSEMIDE 40 MG PO TABS: 40.0000 mg | ORAL_TABLET | Freq: Every day | ORAL | Status: DC

## 2013-03-01 MED ORDER — LEVOTHYROXINE SODIUM 150 MCG PO TABS: 150.0000 ug | ORAL_TABLET | Freq: Every day | ORAL | Status: DC

## 2013-03-01 MED ORDER — CYANOCOBALAMIN 1000 MCG/ML IJ SOLN
1000.0000 ug | Freq: Once | INTRAMUSCULAR | Status: AC
  Administered 2013-03-01: 1000 ug via INTRAMUSCULAR

## 2013-03-01 MED ORDER — POTASSIUM CHLORIDE ER 10 MEQ PO TBCR: 10.0000 meq | EXTENDED_RELEASE_TABLET | Freq: Two times a day (BID) | ORAL | Status: DC

## 2013-03-01 NOTE — Progress Notes (Signed)
Pre-visit discussion using our clinic review tool. No additional management support is needed unless otherwise documented below in the visit note.  

## 2013-03-01 NOTE — Progress Notes (Signed)
Subjective:    Patient ID: Lori Le, female    DOB: 10/18/1965, 47 y.o.   MRN: [id number]  HPI 47 year old female with past history of hypercholesterolemia, hypothyroidism and depression.  She has a documented history of hypertension, but states her blood pressure has been doing well lately.  Is on two fluid pills for "fluid accumulation".  States she has tried to get off of these and has issues with swelling - when off these medications.  She comes in today for a scheduled follow up.  I tried to cut back her triam/hctz secondary to an elevated uric acid level.  She reported increased swelling only after a few days of being off the medication.  Back on now.  Sees Dr Logan Bores for her GYN exams.  Previously had an abnormal pap.  Is s/p colposcopy.  States everything checked out fine. Pap smears since have been normal.  Sees Dr Renae Fickle for her thyroid issues.  Has issues with allergies.  Has seen Dr Jenne Campus.  Takes zyrtec prn.  Has had allergy testing.  She has a history of migraines.  Has visual auras (tunnel vision and bright lights).  Will then notice headache about 20 minutes later.  These are doing better.  Not a significant issue for her now.  She has had gastric bypass.  Initially had lap band.  Had persistent emesis, so band removed and she had bypass.  Top weight was 307 pounds.  She does exercise.  Walks.   A history of depression.  Doing well now.  She reported decreased energy.  See previous note.  Home sleep test negative for sleep apnea.  She was having some tingling in her feet.  B12 low.  Started injections.  Tingling better.      Past Medical History  Diagnosis Date  . Hypertension   . Hyperlipidemia   . Allergy   . GERD (gastroesophageal reflux disease)   . Arthritis   . Melanoma in situ     left shoulder  . Depression   . History of eating disorder   . Migraine   . Hypothyroidism   . PCOS (polycystic ovarian syndrome)     Outpatient Encounter Prescriptions as of 03/01/2013   Medication Sig  . furosemide (LASIX) 40 MG tablet Take 1 tablet (40 mg total) by mouth daily.  [name] levothyroxine (SYNTHROID, LEVOTHROID) 150 MCG tablet Take 150 mcg by mouth daily. Take one a day, & two on Sunday  . norgestimate-ethinyl estradiol (ORTHO-CYCLEN,SPRINTEC,PREVIFEM) 0.25-35 MG-MCG tablet Take 1 tablet by mouth daily.  [name] omeprazole (PRILOSEC) 20 MG capsule Take 1 capsule (20 mg total) by mouth daily.  . potassium chloride (K-DUR) 10 MEQ tablet Take 1 tablet (10 mEq total) by mouth 2 (two) times daily.  [name] triamterene-hydrochlorothiazide (MAXZIDE-25) 37.5-25 MG per tablet Take 1 tablet by mouth daily.  . [EXPIRED] cyanocobalamin ((VITAMIN B-12)) injection 1,000 mcg     Review of Systems Patient denies any headache, lightheadedness or dizziness now.  Does have a history of migraines as outlined. Not a significant issue currently.  Increased fatigue.  Increased daytime somnolence. Difficulty sleeping.  Home sleep test negative for apnea.  No chest pain, tightness or palpitations.  No cardiac symptoms with increased activity or exertion.  No increased shortness of breath, cough or congestion.  Allergy symptoms relatively well controlled currently.   No nausea or vomiting.  Takes omeprazole.  No increased acid reflux reported.  No abdominal pain or cramping.  No bowel change, such as diarrhea, constipation,  BRBPR or melana.  No urine change.  No significant depression.      Objective:   Physical Exam  Filed Vitals:   03/01/13 1600  BP: 120/80  Pulse: 71  Temp: 98 F (49.28 C)   47 year old female in no acute distress.   HEENT:  Nares- clear.  Oropharynx - without lesions. NECK:  Supple.  Nontender.  No audible bruit.  HEART:  Appears to be regular. LUNGS:  No crackles or wheezing audible.  Respirations even and unlabored.  RADIAL PULSE:  Equal bilaterally.  ABDOMEN:  Soft, nontender.  Bowel sounds present and normal.  No audible abdominal bruit.  EXTREMITIES:  No increased edema  present.  DP pulses palpable and equal bilaterally.          Assessment & Plan:  ELEVATED URIC ACID LEVEL.  Noted on recent labs.  She is on triam/hctz.  Never had gout.  Tried to decrease triam/hctz.  Did not tolerate.  Follow.     HEALTH MAINTENANCE.  Had her breast, pelvic and pap smear through GYN (Dr Logan Bores).   States was ok.  Had her mammogram 12/13 - ok (per her report).

## 2013-03-03 ENCOUNTER — Ambulatory Visit: Payer: 59

## 2013-03-04 ENCOUNTER — Encounter: Payer: Self-pay | Admitting: Internal Medicine

## 2013-03-04 NOTE — Assessment & Plan Note (Signed)
Doing well on no medication.  Follow.  

## 2013-03-04 NOTE — Assessment & Plan Note (Signed)
Symptoms and exam last visit were consistent with peripheral neuropathy.  B12 low.  Started on B12 injections.  Better.  Follow.   

## 2013-03-04 NOTE — Assessment & Plan Note (Signed)
Swelling improved some today.  Describes worsening swelling at times.  Worse at the end of the day.  Support hose.

## 2013-03-04 NOTE — Assessment & Plan Note (Signed)
Has migraines as outlined.  Not a significant issue for her now.  Follow.   

## 2013-03-04 NOTE — Assessment & Plan Note (Signed)
Low cholesterol diet and exercise.  Follow lipid panel.   

## 2013-03-04 NOTE — Assessment & Plan Note (Signed)
On omeprazole.  

## 2013-03-04 NOTE — Assessment & Plan Note (Signed)
Blood pressure doing well.  Follow.  

## 2013-03-04 NOTE — Assessment & Plan Note (Signed)
On synthroid.  Sees Dr Paul.  Follow.   

## 2013-03-04 NOTE — Assessment & Plan Note (Signed)
Persistent.   Interrupted sleep.  Home sleep test negative for sleep apnea.  With the persistent symptoms and difficulty breathing, feel she needs ENT evaluation to evaluate for structural abnormalities contributing to her symptoms.

## 2013-03-04 NOTE — Assessment & Plan Note (Signed)
Top weight was 307.  Continue diet adjustment and exercise.  B12 low.  Receiving B12 injections.     

## 2013-03-15 ENCOUNTER — Other Ambulatory Visit (INDEPENDENT_AMBULATORY_CARE_PROVIDER_SITE_OTHER): Payer: 59

## 2013-03-15 DIAGNOSIS — E7800 Pure hypercholesterolemia, unspecified: Secondary | ICD-10-CM

## 2013-03-15 DIAGNOSIS — R609 Edema, unspecified: Secondary | ICD-10-CM

## 2013-03-15 LAB — COMPREHENSIVE METABOLIC PANEL
ALT: 13 U/L (ref 0–35)
Alkaline Phosphatase: 45 U/L (ref 39–117)
BUN: 16 mg/dL (ref 6–23)
Chloride: 99 mEq/L (ref 96–112)
Creatinine, Ser: 0.6 mg/dL (ref 0.4–1.2)
Glucose, Bld: 84 mg/dL (ref 70–99)
Sodium: 138 mEq/L (ref 135–145)
Total Bilirubin: 0.5 mg/dL (ref 0.3–1.2)
Total Protein: 6.7 g/dL (ref 6.0–8.3)

## 2013-03-15 LAB — LIPID PANEL
Cholesterol: 178 mg/dL (ref 0–200)
HDL: 61.7 mg/dL (ref >39.00)
Total CHOL/HDL Ratio: 3
VLDL: 43.4 mg/dL — ABNORMAL HIGH (ref 0.0–40.0)

## 2013-03-16 ENCOUNTER — Encounter: Payer: Self-pay | Admitting: *Deleted

## 2013-03-26 ENCOUNTER — Ambulatory Visit (INDEPENDENT_AMBULATORY_CARE_PROVIDER_SITE_OTHER): Payer: 59 | Admitting: Adult Health

## 2013-03-26 ENCOUNTER — Encounter: Payer: Self-pay | Admitting: Adult Health

## 2013-03-26 VITALS — BP 120/64 | HR 93 | Temp 99.7°F | Resp 12 | Wt 228.0 lb

## 2013-03-26 DIAGNOSIS — J069 Acute upper respiratory infection, unspecified: Secondary | ICD-10-CM

## 2013-03-26 DIAGNOSIS — J029 Acute pharyngitis, unspecified: Secondary | ICD-10-CM

## 2013-03-26 DIAGNOSIS — M2560 Stiffness of unspecified joint, not elsewhere classified: Secondary | ICD-10-CM | POA: Insufficient documentation

## 2013-03-26 LAB — POCT RAPID STREP A (OFFICE): Rapid Strep A Screen: NEGATIVE

## 2013-03-26 MED ORDER — AMOXICILLIN-POT CLAVULANATE 875-125 MG PO TABS: 1.0000 | ORAL_TABLET | Freq: Two times a day (BID) | ORAL | Status: DC

## 2013-03-26 MED ORDER — GUAIFENESIN-CODEINE 100-10 MG/5ML PO SOLN: 5.0000 mL | Freq: Three times a day (TID) | ORAL | Status: DC | PRN

## 2013-03-26 MED ORDER — FLUCONAZOLE 150 MG PO TABS: 150.0000 mg | ORAL_TABLET | Freq: Once | ORAL | Status: DC

## 2013-03-26 NOTE — Patient Instructions (Addendum)
Start your antibiotic today and take until completed even if feeling better. Augmentin twice a day for 10 days. I prescribed Robitussin AC for severe cough. This medication has codeine and will cause sedation. Avoid driving while taking this medication.  A cool mist humidifier will help with sinus symptoms. Also, getting in the bathroom and opening up the hot shower may help soothe symptoms of congestion.  Use Afrin nasal spray for severe congestion. Use only for 3 days. Tylenol or Advil for fever or general discomfort.    Sinusitis is a condition that can cause a stuffy nose, pain in the face, and yellow or green discharge (mucus) from the nose. The sinuses are hollow areas in the bones of the face. They have a thin lining that normally makes a small amount of mucus. When this lining gets infected, it swells and makes extra mucus. This causes symptoms.   Sinusitis can occur when a person gets sick with a cold. The germs causing the cold can also infect the sinuses. Many times, a person feels like his or her cold is getting better. But then he or she gets sinusitis and begins to feel sick again.  What are the symptoms of sinusitis? - Common symptoms of sinusitis include:  Stuffy or blocked nose  Thick yellow or green discharge from the nose  Pain in the teeth  Pain or pressure in the face - This often feels worse when a person bends forward.   People with sinusitis can also have other symptoms that include:  Fever  Cough  Trouble smelling  Ear pressure or fullness  Headache  Bad breath  Feeling tired   Most of the time, symptoms start to improve in 7 to 10 days.  See your doctor or nurse if your symptoms last more than 7 days, or if your symptoms get better at first but then get worse.  Sometimes, sinusitis can lead to serious problems. See your doctor or nurse right away (do not wait 7 days) if you have:  Fever higher than 102.92F (39.2C)  Sudden and severe pain in the face  and head  Trouble seeing or seeing double  Trouble thinking clearly  Swelling or redness around 1 or both eyes  Trouble breathing or a stiff neck   Is there anything I can do on my own to feel better? - Yes. To reduce your symptoms, you can: Take an over-the-counter pain reliever to reduce the pain  Rinse your nose and sinuses with salt water a few times a day - Ask your doctor or nurse about the best way to do this.  Use a decongestant nose spray - These sprays are sold in a pharmacy. But do not use decongestant nose sprays for more than 2 to 3 days in a row. Using them more than 3 days in a row can make symptoms worse.   You should NOT take an antihistamine for sinusitis. Common antihistamines include diphenhydramine (sample brand name: Benadryl), chlorpheniramine (sample brand name: Chlor-Trimeton), loratadine (sample brand name: Claritin), and cetirizine (sample brand name: Zyrtec). They can treat allergies, but not sinus infections, and could increase your discomfort by drying the lining of your nose and sinuses, or making you tired.   Your doctor might also prescribe a steroid nose spray to reduce the swelling in your nose. (Steroid nose sprays do not contain the same steroids that athletes take to build muscle.)  How is sinusitis treated? - Most of the time, sinusitis does not need to be  treated with antibiotic medicines. This is because most sinusitis is caused by viruses - not bacteria - and antibiotics do not kill viruses. Many people get over sinus infections without antibiotics.  Some people with sinusitis do need treatment with antibiotics. If your symptoms have not improved after 7 to 10 days, ask your doctor if you should take antibiotics. Your doctor might recommend that you wait 1 more week to see if your symptoms improve. But if you have symptoms such as a fever or a lot of pain, he or she might prescribe antibiotics. It is important to follow your doctor's instructions about  taking your antibiotics.

## 2013-03-26 NOTE — Progress Notes (Signed)
   Subjective:    Patient ID: Lori Le, female    DOB: [birth year], 47 y.o.   MRN: [id number]  HPI Patient is a pleasant 47 year old female who presents to clinic with upper respiratory infection symptoms. These include sinus congestion, severe nasal stuffiness, yellow drainage, cough and low-grade fever. Symptoms began approximately 2 days ago. She has been taking Mucinex D but presents to clinic because she is "just feeling terrible"  Current Outpatient Prescriptions on File Prior to Visit  Medication Sig Dispense Refill  . furosemide (LASIX) 40 MG tablet Take 1 tablet (40 mg total) by mouth daily.  90 tablet  1  . levothyroxine (SYNTHROID, LEVOTHROID) 150 MCG tablet Take 1 tablet (150 mcg total) by mouth daily. Take one a day, & two on Sunday  102 tablet  1  . norgestimate-ethinyl estradiol (ORTHO-CYCLEN,SPRINTEC,PREVIFEM) 0.25-35 MG-MCG tablet Take 1 tablet by mouth daily.      [name] omeprazole (PRILOSEC) 20 MG capsule Take 1 capsule (20 mg total) by mouth daily.  90 capsule  1  . potassium chloride (K-DUR) 10 MEQ tablet Take 1 tablet (10 mEq total) by mouth 2 (two) times daily.  180 tablet  1  . triamterene-hydrochlorothiazide (MAXZIDE-25) 37.5-25 MG per tablet Take 1 tablet by mouth daily.  90 tablet  1   No current facility-administered medications on file prior to visit.       Review of Systems  Constitutional: Positive for fever and chills.  HENT: Positive for congestion, ear pain, postnasal drip, rhinorrhea, sinus pressure, sneezing and sore throat.   Respiratory: Positive for cough. Negative for shortness of breath and wheezing.   Cardiovascular: Negative.        Objective:   Physical Exam  Constitutional: She is oriented to person, place, and time.  Appears acutely ill  HENT:  Head: Normocephalic and atraumatic.  Right Ear: External ear normal.  Left Ear: External ear normal.  Right tonsil swollen. Pharyngeal erythema.  Cardiovascular: Normal rate and regular rhythm.    Pulmonary/Chest: Effort normal and breath sounds normal. No respiratory distress. She has no wheezes. She has no rales.  Lymphadenopathy:    She has cervical adenopathy.  Neurological: She is alert and oriented to person, place, and time.  Skin: Skin is warm and dry.  Psychiatric: She has a normal mood and affect. Her behavior is normal. Judgment and thought content normal.          Assessment & Plan:

## 2013-03-26 NOTE — Assessment & Plan Note (Signed)
Yellow-greening drainage. Fever. Start Augmentin x 10 days. Strep negative. Robitussin AC for severe cough.

## 2013-03-26 NOTE — Progress Notes (Signed)
Pre visit review using our clinic review tool, if applicable. No additional management support is needed unless otherwise documented below in the visit note. 

## 2013-03-29 ENCOUNTER — Telehealth: Payer: Self-pay | Admitting: Internal Medicine

## 2013-03-29 NOTE — Telephone Encounter (Signed)
Continue medication prescribed. No fever is an improvement. What is she taking for cough?

## 2013-03-29 NOTE — Telephone Encounter (Signed)
Patient left voice mail stating she saw Raquel on Friday and is still no better she no longer has fever however she is still coughing up yellow stuff and still has cough. She would like to know if she should try something different or continue on the medication she was given on Friday.

## 2013-03-30 NOTE — Telephone Encounter (Signed)
Patient may try an OTC cough medication such as Delsym. If she takes the Delsym she should not take the prescription cough medication

## 2013-03-30 NOTE — Telephone Encounter (Signed)
States she still has productive cough with yellow sputum. Taking Guaifenesin-codeine cough syrup given at appointment. Takes it in the evening, works well for an hour. Does not like taking it because it "makes me feel bad". Advised to continue antibiotic as directed.

## 2013-03-30 NOTE — Telephone Encounter (Signed)
Left message, notifying patient. 

## 2013-03-31 ENCOUNTER — Telehealth: Payer: Self-pay | Admitting: *Deleted

## 2013-04-02 NOTE — Telephone Encounter (Signed)
error 

## 2013-04-06 ENCOUNTER — Encounter (INDEPENDENT_AMBULATORY_CARE_PROVIDER_SITE_OTHER): Payer: Self-pay

## 2013-04-06 ENCOUNTER — Ambulatory Visit (INDEPENDENT_AMBULATORY_CARE_PROVIDER_SITE_OTHER): Payer: 59 | Admitting: *Deleted

## 2013-04-06 DIAGNOSIS — E538 Deficiency of other specified B group vitamins: Secondary | ICD-10-CM

## 2013-04-06 MED ORDER — CYANOCOBALAMIN 1000 MCG/ML IJ SOLN
1000.0000 ug | Freq: Once | INTRAMUSCULAR | Status: AC
  Administered 2013-04-06: 1000 ug via INTRAMUSCULAR

## 2013-04-09 ENCOUNTER — Telehealth: Payer: Self-pay | Admitting: Internal Medicine

## 2013-04-09 ENCOUNTER — Other Ambulatory Visit: Payer: Self-pay | Admitting: Adult Health

## 2013-04-09 MED ORDER — AMOXICILLIN-POT CLAVULANATE 875-125 MG PO TABS: 1.0000 | ORAL_TABLET | Freq: Two times a day (BID) | ORAL | Status: DC

## 2013-04-09 NOTE — Telephone Encounter (Signed)
Pt was seen by R. Rey 12/26 for URI.  States she is still blowing yellow and coughing it up, sinus pressure/headache.  Asking for extended antibiotic or to try something different.  Did not want to schedule appt.

## 2013-05-07 ENCOUNTER — Ambulatory Visit: Payer: 59

## 2013-05-10 ENCOUNTER — Ambulatory Visit (INDEPENDENT_AMBULATORY_CARE_PROVIDER_SITE_OTHER): Payer: 59 | Admitting: *Deleted

## 2013-05-10 ENCOUNTER — Encounter (INDEPENDENT_AMBULATORY_CARE_PROVIDER_SITE_OTHER): Payer: Self-pay

## 2013-05-10 DIAGNOSIS — E538 Deficiency of other specified B group vitamins: Secondary | ICD-10-CM

## 2013-05-10 MED ORDER — CYANOCOBALAMIN 1000 MCG/ML IJ SOLN
1000.0000 ug | Freq: Once | INTRAMUSCULAR | Status: AC
  Administered 2013-05-10: 1000 ug via INTRAMUSCULAR

## 2013-05-31 ENCOUNTER — Ambulatory Visit (INDEPENDENT_AMBULATORY_CARE_PROVIDER_SITE_OTHER): Payer: 59 | Admitting: Internal Medicine

## 2013-05-31 ENCOUNTER — Encounter: Payer: Self-pay | Admitting: Internal Medicine

## 2013-05-31 VITALS — BP 120/70 | HR 72 | Temp 98.8°F | Ht 67.0 in | Wt 226.0 lb

## 2013-05-31 DIAGNOSIS — G629 Polyneuropathy, unspecified: Secondary | ICD-10-CM

## 2013-05-31 DIAGNOSIS — D039 Melanoma in situ, unspecified: Secondary | ICD-10-CM

## 2013-05-31 DIAGNOSIS — I10 Essential (primary) hypertension: Secondary | ICD-10-CM

## 2013-05-31 DIAGNOSIS — F3289 Other specified depressive episodes: Secondary | ICD-10-CM

## 2013-05-31 DIAGNOSIS — G43909 Migraine, unspecified, not intractable, without status migrainosus: Secondary | ICD-10-CM

## 2013-05-31 DIAGNOSIS — E7800 Pure hypercholesterolemia, unspecified: Secondary | ICD-10-CM

## 2013-05-31 DIAGNOSIS — K219 Gastro-esophageal reflux disease without esophagitis: Secondary | ICD-10-CM

## 2013-05-31 DIAGNOSIS — F329 Major depressive disorder, single episode, unspecified: Secondary | ICD-10-CM

## 2013-05-31 DIAGNOSIS — F32A Depression, unspecified: Secondary | ICD-10-CM

## 2013-05-31 DIAGNOSIS — E282 Polycystic ovarian syndrome: Secondary | ICD-10-CM

## 2013-05-31 DIAGNOSIS — G609 Hereditary and idiopathic neuropathy, unspecified: Secondary | ICD-10-CM

## 2013-05-31 DIAGNOSIS — R609 Edema, unspecified: Secondary | ICD-10-CM

## 2013-05-31 DIAGNOSIS — C439 Malignant melanoma of skin, unspecified: Secondary | ICD-10-CM

## 2013-05-31 DIAGNOSIS — Z9884 Bariatric surgery status: Secondary | ICD-10-CM

## 2013-05-31 DIAGNOSIS — M2560 Stiffness of unspecified joint, not elsewhere classified: Secondary | ICD-10-CM

## 2013-05-31 DIAGNOSIS — Z9109 Other allergy status, other than to drugs and biological substances: Secondary | ICD-10-CM

## 2013-05-31 DIAGNOSIS — E039 Hypothyroidism, unspecified: Secondary | ICD-10-CM

## 2013-05-31 NOTE — Assessment & Plan Note (Addendum)
Followed by Dr Graham.  Up to date with skin checks.  Will discuss with her at her next visit if cxr,etc warranted.  Last evaluation she had basal cell removed.  Follow.   

## 2013-05-31 NOTE — Assessment & Plan Note (Addendum)
Describes worsening swelling at times.  Worse at the end of the day.  Support hose.  Increased fluid retention with her periods.  Follow.   

## 2013-05-31 NOTE — Assessment & Plan Note (Addendum)
Doing well on no medication.  Follow.  

## 2013-05-31 NOTE — Assessment & Plan Note (Addendum)
Symptoms and exam last visit were consistent with peripheral neuropathy.  B12 low.  Started on B12 injections.  Better.  Follow.

## 2013-05-31 NOTE — Progress Notes (Signed)
Subjective:    Patient ID: Lori Le, female    DOB: Oct 23, 1965, 48 y.o.   MRN: [id number]  HPI 48 year old female with past history of hypercholesterolemia, hypothyroidism and depression.  She has a documented history of hypertension, but states her blood pressure has been doing well lately.  Is on two fluid pills for "fluid accumulation".  States she has tried to get off of these and has issues with swelling - when off these medications.  She comes in today for a scheduled follow up.  I tried to cut back her triam/hctz secondary to an elevated uric acid level.  She reported increased swelling only after a few days of being off the medication.  Back on now.  Sees Dr Amalia Hailey for her GYN exams.  Previously had an abnormal pap.  Is s/p colposcopy.  States everything checked out fine. Pap smears since have been normal.  Sees Dr Eddie Dibbles for her thyroid issues.  Has issues with allergies.  Has seen Dr Tami Ribas.  Takes zyrtec prn.  Has had allergy testing.  She has a history of migraines.  Has visual auras (tunnel vision and bright lights).  Will then notice headache about 20 minutes later.  These are doing better.  Not a significant issue for her now.  She has had gastric bypass.  Initially had lap band.  Had persistent emesis, so band removed and she had bypass.  Top weight was 307 pounds.  She does exercise.  Walks.   A history of depression.  Doing well now.  She reported decreased energy.  See previous note.  Home sleep test negative for sleep apnea.  She was having some tingling in her feet.  B12 low.  Started injections.  Tingling some better.  She does report some stiffness and soreness in her legs and shoulders.  Hurts to reach around to her back.  Feels her stiffness is getting worse.  She also reports that her period is heavier and lasting longer.  Feels she is retaining more fluid.  Had questions about her periods and seeing gyn about her periods.  On ocp's.  Would like to get her off, especially with her  age and history of elevated blood pressure, etc.      Past Medical History  Diagnosis Date  . Hypertension   . Hyperlipidemia   . Allergy   . GERD (gastroesophageal reflux disease)   . Arthritis   . Melanoma in situ     left shoulder  . Depression   . History of eating disorder   . Migraine   . Hypothyroidism   . PCOS (polycystic ovarian syndrome)     Outpatient Encounter Prescriptions as of 05/31/2013  Medication Sig  . furosemide (LASIX) 40 MG tablet Take 1 tablet (40 mg total) by mouth daily.  [name] levothyroxine (SYNTHROID, LEVOTHROID) 150 MCG tablet Take 1 tablet (150 mcg total) by mouth daily. Take one a day, & two on Sunday  . norgestimate-ethinyl estradiol (ORTHO-CYCLEN,SPRINTEC,PREVIFEM) 0.25-35 MG-MCG tablet Take 1 tablet by mouth daily.  [name] omeprazole (PRILOSEC) 20 MG capsule Take 1 capsule (20 mg total) by mouth daily.  . potassium chloride (K-DUR) 10 MEQ tablet Take 1 tablet (10 mEq total) by mouth 2 (two) times daily.  [name] triamterene-hydrochlorothiazide (MAXZIDE-25) 37.5-25 MG per tablet Take 1 tablet by mouth daily.  . [DISCONTINUED] amoxicillin-clavulanate (AUGMENTIN) 875-125 MG per tablet Take 1 tablet by mouth 2 (two) times daily.  . [DISCONTINUED] fluconazole (DIFLUCAN) 150 MG tablet Take 1 tablet (150 mg  total) by mouth once.  . [DISCONTINUED] guaiFENesin-codeine 100-10 MG/5ML syrup Take 5 mLs by mouth 3 (three) times daily as needed.    Review of Systems Patient denies any headache, lightheadedness or dizziness now.  Does have a history of migraines as outlined.  Not a significant issue currently.  Increased fatigue.  Home sleep test negative for apnea.  No chest pain, tightness or palpitations.  No cardiac symptoms with increased activity or exertion.  No increased shortness of breath, cough or congestion.  Allergy symptoms relatively well controlled currently.   No nausea or vomiting.  Takes omeprazole.  No increased acid reflux reported.  No abdominal pain or  cramping.  No bowel change, such as diarrhea, constipation, BRBPR or melana.  No urine change.  No significant depression.  Periods as outlined.  Increased joint and leg pain and discomfort as outlined.        Objective:   Physical Exam  Filed Vitals:   05/31/13 1605  BP: 120/70  Pulse: 72  Temp: 98.8 F (37.1 C)   Blood pressure recheck:  1/33  48 year old female in no acute distress.   HEENT:  Nares- clear.  Oropharynx - without lesions. NECK:  Supple.  Nontender.  No audible bruit.  HEART:  Appears to be regular. LUNGS:  No crackles or wheezing audible.  Respirations even and unlabored.  RADIAL PULSE:  Equal bilaterally.  ABDOMEN:  Soft, nontender.  Bowel sounds present and normal.  No audible abdominal bruit.  EXTREMITIES:  Sine increased edema present. Overall appears to be stable.   DP pulses palpable and equal bilaterally.         Assessment & Plan:  ELEVATED URIC ACID LEVEL.  Noted on recent labs.  She is on triam/hctz.  Never had gout.  Tried to decrease triam/hctz.  Did not tolerate.  Follow.     HEALTH MAINTENANCE.  Had her breast, pelvic and pap smear through GYN (Dr Amalia Hailey).   States was ok.  Had her mammogram 12/13 - ok (per her report).  Need results of 2014 mammogram.

## 2013-06-05 ENCOUNTER — Telehealth: Payer: Self-pay | Admitting: Internal Medicine

## 2013-06-05 ENCOUNTER — Encounter: Payer: Self-pay | Admitting: Internal Medicine

## 2013-06-05 DIAGNOSIS — N926 Irregular menstruation, unspecified: Secondary | ICD-10-CM

## 2013-06-05 NOTE — Assessment & Plan Note (Signed)
On omeprazole.  

## 2013-06-05 NOTE — Assessment & Plan Note (Signed)
Has migraines as outlined.  Not a significant issue for her now.  Follow.   

## 2013-06-05 NOTE — Assessment & Plan Note (Addendum)
Increased stiffness and swelling.  Consider checking rheum panel.  Further w/up pending results.

## 2013-06-05 NOTE — Assessment & Plan Note (Signed)
On synthroid.  Sees Dr Paul.  Follow.   

## 2013-06-05 NOTE — Assessment & Plan Note (Signed)
Doing well.  Takes zyrtec prn.  Has seen Dr Tami Ribas.

## 2013-06-05 NOTE — Assessment & Plan Note (Signed)
Blood pressure doing well.  Follow.  

## 2013-06-05 NOTE — Assessment & Plan Note (Signed)
On Sprintec.  Followed by GYN.  Her gyn is leaving.  Needs a new gyn.  Having increased bleeding.  Increased fluid retention.  On ocp's.  Would like to get her off the ocp's given her age, hypertension, etc.  Will refer to gyn for further evaluation and treatment recs.

## 2013-06-05 NOTE — Telephone Encounter (Signed)
Pt to let me know what gyn she prefers.

## 2013-06-05 NOTE — Assessment & Plan Note (Signed)
Low cholesterol diet and exercise.  Follow lipid panel.   

## 2013-06-05 NOTE — Assessment & Plan Note (Signed)
Top weight was 307.  Continue diet adjustment and exercise.  B12 low.  Receiving B12 injections.

## 2013-06-07 NOTE — Addendum Note (Signed)
Addended by: Alisa Graff on: 06/07/2013 12:20 AM   Modules accepted: Orders

## 2013-06-07 NOTE — Telephone Encounter (Signed)
Order placed for gyn referral.  

## 2013-06-10 ENCOUNTER — Ambulatory Visit (INDEPENDENT_AMBULATORY_CARE_PROVIDER_SITE_OTHER): Payer: 59 | Admitting: *Deleted

## 2013-06-10 DIAGNOSIS — E538 Deficiency of other specified B group vitamins: Secondary | ICD-10-CM

## 2013-06-10 MED ORDER — CYANOCOBALAMIN 1000 MCG/ML IJ SOLN
1000.0000 ug | Freq: Once | INTRAMUSCULAR | Status: AC
  Administered 2013-06-10: 1000 ug via INTRAMUSCULAR

## 2013-08-03 ENCOUNTER — Ambulatory Visit: Payer: 59 | Admitting: Internal Medicine

## 2013-08-03 ENCOUNTER — Encounter: Payer: Self-pay | Admitting: Internal Medicine

## 2013-08-03 ENCOUNTER — Ambulatory Visit (INDEPENDENT_AMBULATORY_CARE_PROVIDER_SITE_OTHER): Payer: 59 | Admitting: Internal Medicine

## 2013-08-03 VITALS — BP 110/70 | HR 82 | Temp 97.9°F | Ht 67.0 in | Wt 225.2 lb

## 2013-08-03 DIAGNOSIS — K219 Gastro-esophageal reflux disease without esophagitis: Secondary | ICD-10-CM

## 2013-08-03 DIAGNOSIS — F329 Major depressive disorder, single episode, unspecified: Secondary | ICD-10-CM

## 2013-08-03 DIAGNOSIS — R609 Edema, unspecified: Secondary | ICD-10-CM

## 2013-08-03 DIAGNOSIS — M2560 Stiffness of unspecified joint, not elsewhere classified: Secondary | ICD-10-CM

## 2013-08-03 DIAGNOSIS — E7800 Pure hypercholesterolemia, unspecified: Secondary | ICD-10-CM

## 2013-08-03 DIAGNOSIS — G609 Hereditary and idiopathic neuropathy, unspecified: Secondary | ICD-10-CM

## 2013-08-03 DIAGNOSIS — F3289 Other specified depressive episodes: Secondary | ICD-10-CM

## 2013-08-03 DIAGNOSIS — E538 Deficiency of other specified B group vitamins: Secondary | ICD-10-CM

## 2013-08-03 DIAGNOSIS — E039 Hypothyroidism, unspecified: Secondary | ICD-10-CM

## 2013-08-03 DIAGNOSIS — G629 Polyneuropathy, unspecified: Secondary | ICD-10-CM

## 2013-08-03 DIAGNOSIS — F32A Depression, unspecified: Secondary | ICD-10-CM

## 2013-08-03 DIAGNOSIS — I10 Essential (primary) hypertension: Secondary | ICD-10-CM

## 2013-08-03 DIAGNOSIS — Z9109 Other allergy status, other than to drugs and biological substances: Secondary | ICD-10-CM

## 2013-08-03 DIAGNOSIS — Z9884 Bariatric surgery status: Secondary | ICD-10-CM

## 2013-08-03 DIAGNOSIS — G43909 Migraine, unspecified, not intractable, without status migrainosus: Secondary | ICD-10-CM

## 2013-08-03 MED ORDER — CYANOCOBALAMIN 1000 MCG/ML IJ SOLN
1000.0000 ug | Freq: Once | INTRAMUSCULAR | Status: AC
  Administered 2013-08-03: 1000 ug via INTRAMUSCULAR

## 2013-08-04 ENCOUNTER — Encounter: Payer: Self-pay | Admitting: Internal Medicine

## 2013-08-04 NOTE — Assessment & Plan Note (Signed)
Doing well.  Takes zyrtec prn.  Has seen Dr Tami Ribas.

## 2013-08-04 NOTE — Assessment & Plan Note (Signed)
Appears to be better.  Recent ESR wnl.  CRP slightly elevated.  Recheck.  RF and ANA negative.  Follow.

## 2013-08-04 NOTE — Assessment & Plan Note (Signed)
On omeprazole.  

## 2013-08-04 NOTE — Assessment & Plan Note (Signed)
Doing well on no medication.  Follow.  

## 2013-08-04 NOTE — Assessment & Plan Note (Signed)
Top weight was 307.  Continue diet adjustment and exercise.  B12 low.  Receiving B12 injections.

## 2013-08-04 NOTE — Progress Notes (Signed)
Subjective:    Patient ID: Lori Le, female    DOB: [birth year], 48 y.o.   MRN: [id number]  HPI 48 year old female with past history of hypercholesterolemia, hypothyroidism and depression.  She has a documented history of hypertension, but states her blood pressure has been doing well lately.  Is on two fluid pills for "fluid accumulation".  States she has tried to get off of these and has issues with swelling - when off these medications.  She comes in today for a scheduled follow up.  I tried to cut back her triam/hctz secondary to an elevated uric acid level.  She reported increased swelling only after a few days of being off the medication.  Back on now.  Has been seeing Dr Amalia Hailey for her GYN exams.  Previously had an abnormal pap.  Is s/p colposcopy.  States everything checked out fine.  Pap smears since have been normal.  Sees Dr Eddie Dibbles for her thyroid issues.  Has issues with allergies.  Has seen Dr Tami Ribas. Takes zyrtec prn.  Has had allergy testing.  She has a history of migraines.  Has visual auras (tunnel vision and bright lights).  Will then notice headache about 20 minutes later.  These are doing better.  Not a significant issue for her now.  She has had gastric bypass.  Initially had lap band.  Had persistent emesis, so band removed and she had bypass.  Top weight was 307 pounds.  She does exercise.  Walks.   A history of depression.  Doing well now.  She reported decreased energy.  See previous note.  Home sleep test negative for sleep apnea.  Overall appears to be doing better.  Still some joint discomfort, but this appears to be better as well.  Off ocp's.  Say gyn.  Had IUD now.  Doing well.  Has f/u with Dr Marcelline Mates 08/26/13.       Past Medical History  Diagnosis Date  . Hypertension   . Hyperlipidemia   . Allergy   . GERD (gastroesophageal reflux disease)   . Arthritis   . Melanoma in situ     left shoulder  . Depression   . History of eating disorder   . Migraine   .  Hypothyroidism   . PCOS (polycystic ovarian syndrome)     Outpatient Encounter Prescriptions as of 08/03/2013  Medication Sig  . furosemide (LASIX) 40 MG tablet Take 1 tablet (40 mg total) by mouth daily.  [name] levothyroxine (SYNTHROID, LEVOTHROID) 150 MCG tablet Take 1 tablet (150 mcg total) by mouth daily. Take one a day, & two on Sunday  . omeprazole (PRILOSEC) 20 MG capsule Take 1 capsule (20 mg total) by mouth daily.  . potassium chloride (K-DUR) 10 MEQ tablet Take 1 tablet (10 mEq total) by mouth 2 (two) times daily.  [name] triamterene-hydrochlorothiazide (MAXZIDE-25) 37.5-25 MG per tablet Take 1 tablet by mouth daily.  . [DISCONTINUED] norgestimate-ethinyl estradiol (ORTHO-CYCLEN,SPRINTEC,PREVIFEM) 0.25-35 MG-MCG tablet Take 1 tablet by mouth daily.  . [EXPIRED] cyanocobalamin ((VITAMIN B-12)) injection 1,000 mcg     Review of Systems Patient denies any headache, lightheadedness or dizziness now.  Does have a history of migraines as outlined.  Not a significant issue currently.   No chest pain, tightness or palpitations.  No cardiac symptoms with increased activity or exertion.  No increased shortness of breath, cough or congestion.  Allergy symptoms relatively well controlled currently.   No nausea or vomiting.  Takes omeprazole.  No increased acid reflux  reported.  No abdominal pain or cramping.  No bowel change, such as diarrhea, constipation, BRBPR or melana.  No urine change.  No significant depression.  Has an IUD in place now.  Off ocp's.  Still some joint flares.         Objective:   Physical Exam  Filed Vitals:   08/03/13 0816  BP: 110/70  Pulse: 82  Temp: 97.9 F (47.51 C)   48 year old female in no acute distress.   HEENT:  Nares- clear.  Oropharynx - without lesions. NECK:  Supple.  Nontender.  No audible bruit.  HEART:  Appears to be regular. LUNGS:  No crackles or wheezing audible.  Respirations even and unlabored.  RADIAL PULSE:  Equal bilaterally.  ABDOMEN:  Soft,  nontender.  Bowel sounds present and normal.  No audible abdominal bruit.  EXTREMITIES:  Sine increased edema present. Overall appears to be stable.   DP pulses palpable and equal bilaterally.         Assessment & Plan:  ELEVATED URIC ACID LEVEL.  Noted on recent labs.  She is on triam/hctz.  Never had gout.  Tried to decrease triam/hctz.  Did not tolerate.  Follow.     HEALTH MAINTENANCE.  Had her breast, pelvic and pap smear through GYN (Dr Amalia Hailey).   States was ok.  Had her mammogram 12/13 - ok (per her report).  Need results of 2014 mammogram.

## 2013-08-04 NOTE — Assessment & Plan Note (Signed)
Low cholesterol diet and exercise.  Follow lipid panel.   

## 2013-08-04 NOTE — Assessment & Plan Note (Signed)
Has migraines as outlined.  Not a significant issue for her now.  Follow.   

## 2013-08-04 NOTE — Assessment & Plan Note (Signed)
Symptoms and exam last visit were consistent with peripheral neuropathy.  B12 low.  Started on B12 injections.  Better.  Follow.

## 2013-08-04 NOTE — Assessment & Plan Note (Signed)
Describes worsening swelling at times.  Worse at the end of the day.  Support hose.  Increased fluid retention with her periods.  Follow.   

## 2013-08-04 NOTE — Assessment & Plan Note (Signed)
On synthroid.  Sees Dr Paul.  Follow.   

## 2013-08-04 NOTE — Assessment & Plan Note (Signed)
Blood pressure doing well.  Follow.  

## 2013-08-10 DIAGNOSIS — M23329 Other meniscus derangements, posterior horn of medial meniscus, unspecified knee: Secondary | ICD-10-CM | POA: Insufficient documentation

## 2013-08-10 DIAGNOSIS — E038 Other specified hypothyroidism: Secondary | ICD-10-CM | POA: Insufficient documentation

## 2013-08-10 DIAGNOSIS — I059 Rheumatic mitral valve disease, unspecified: Secondary | ICD-10-CM | POA: Insufficient documentation

## 2013-08-10 DIAGNOSIS — N920 Excessive and frequent menstruation with regular cycle: Secondary | ICD-10-CM | POA: Insufficient documentation

## 2013-08-11 ENCOUNTER — Telehealth: Payer: Self-pay | Admitting: Internal Medicine

## 2013-08-11 DIAGNOSIS — D649 Anemia, unspecified: Secondary | ICD-10-CM

## 2013-08-11 MED ORDER — LEVOTHYROXINE SODIUM 137 MCG PO TABS: 137.0000 ug | ORAL_TABLET | Freq: Every day | ORAL | Status: DC

## 2013-08-11 NOTE — Telephone Encounter (Signed)
Pt notified of labs via my chart.  Will change tsh to 153mcg q day.  Recheck tsh in 4-6 weeks.  Recheck cbc, rbc folate, b12 and iron studies within one week.

## 2013-08-13 NOTE — Telephone Encounter (Signed)
Called pt and left message, notified of results. Lab slips left up front for pickup.

## 2013-08-30 ENCOUNTER — Encounter: Payer: Self-pay | Admitting: Internal Medicine

## 2013-08-31 ENCOUNTER — Encounter: Payer: Self-pay | Admitting: Internal Medicine

## 2013-08-31 NOTE — Telephone Encounter (Signed)
I am ok with her having her labs drawn when she comes in for B12.  I have placed the order.  Do you need to put her on the schedule.  This will be a non fasting lab.

## 2013-08-31 NOTE — Addendum Note (Signed)
Addended by: Alisa Graff on: 08/31/2013 01:45 PM   Modules accepted: Orders

## 2013-08-31 NOTE — Telephone Encounter (Signed)
Lab scheduled °

## 2013-09-06 ENCOUNTER — Other Ambulatory Visit (INDEPENDENT_AMBULATORY_CARE_PROVIDER_SITE_OTHER): Payer: 59

## 2013-09-06 ENCOUNTER — Ambulatory Visit (INDEPENDENT_AMBULATORY_CARE_PROVIDER_SITE_OTHER): Payer: 59 | Admitting: *Deleted

## 2013-09-06 DIAGNOSIS — D649 Anemia, unspecified: Secondary | ICD-10-CM

## 2013-09-06 DIAGNOSIS — R609 Edema, unspecified: Secondary | ICD-10-CM

## 2013-09-06 DIAGNOSIS — G629 Polyneuropathy, unspecified: Secondary | ICD-10-CM

## 2013-09-06 DIAGNOSIS — R5383 Other fatigue: Secondary | ICD-10-CM

## 2013-09-06 DIAGNOSIS — E538 Deficiency of other specified B group vitamins: Secondary | ICD-10-CM

## 2013-09-06 DIAGNOSIS — R252 Cramp and spasm: Secondary | ICD-10-CM

## 2013-09-06 MED ORDER — CYANOCOBALAMIN 1000 MCG/ML IJ SOLN
1000.0000 ug | Freq: Once | INTRAMUSCULAR | Status: AC
  Administered 2013-09-06: 1000 ug via INTRAMUSCULAR

## 2013-09-07 ENCOUNTER — Encounter: Payer: Self-pay | Admitting: Internal Medicine

## 2013-09-07 DIAGNOSIS — D649 Anemia, unspecified: Secondary | ICD-10-CM

## 2013-09-07 LAB — CBC WITH DIFFERENTIAL/PLATELET
Basophils Absolute: 0.1 10*3/uL (ref 0.0–0.1)
Basophils Relative: 0.9 % (ref 0.0–3.0)
Eosinophils Absolute: 0.1 10*3/uL (ref 0.0–0.7)
Eosinophils Relative: 1.4 % (ref 0.0–5.0)
HEMATOCRIT: 33.5 % — AB (ref 36.0–46.0)
HEMOGLOBIN: 10.7 g/dL — AB (ref 12.0–15.0)
LYMPHS PCT: 28.6 % (ref 12.0–46.0)
Lymphs Abs: 2.5 10*3/uL (ref 0.7–4.0)
MCHC: 31.9 g/dL (ref 30.0–36.0)
MCV: 76.4 fl — ABNORMAL LOW (ref 78.0–100.0)
MONOS PCT: 6.5 % (ref 3.0–12.0)
Monocytes Absolute: 0.6 10*3/uL (ref 0.1–1.0)
NEUTROS ABS: 5.4 10*3/uL (ref 1.4–7.7)
Neutrophils Relative %: 62.6 % (ref 43.0–77.0)
Platelets: 303 10*3/uL (ref 150.0–400.0)
RBC: 4.38 Mil/uL (ref 3.87–5.11)
RDW: 15.3 % (ref 11.5–15.5)
WBC: 8.7 10*3/uL (ref 4.0–10.5)

## 2013-09-07 LAB — IBC PANEL
Iron: 21 ug/dL — ABNORMAL LOW (ref 42–145)
Saturation Ratios: 4.6 % — ABNORMAL LOW (ref 20.0–50.0)
Transferrin: 325.6 mg/dL (ref 212.0–360.0)

## 2013-09-07 LAB — FERRITIN: FERRITIN: 5.2 ng/mL — AB (ref 10.0–291.0)

## 2013-09-07 LAB — FOLATE RBC: RBC Folate: 496 ng/mL (ref >280)

## 2013-09-09 NOTE — Telephone Encounter (Signed)
Unread mychart message mailed to patient 

## 2013-09-10 NOTE — Addendum Note (Signed)
Addended by: Alisa Graff on: 09/10/2013 10:31 AM   Modules accepted: Orders

## 2013-09-10 NOTE — Telephone Encounter (Signed)
Order placed for f/u labs.  

## 2013-09-13 ENCOUNTER — Encounter: Payer: Self-pay | Admitting: *Deleted

## 2013-09-16 ENCOUNTER — Other Ambulatory Visit (INDEPENDENT_AMBULATORY_CARE_PROVIDER_SITE_OTHER): Payer: 59

## 2013-09-16 DIAGNOSIS — D649 Anemia, unspecified: Secondary | ICD-10-CM

## 2013-09-17 LAB — CBC WITH DIFFERENTIAL/PLATELET
Basophils Absolute: 0.5 10*3/uL — ABNORMAL HIGH (ref 0.0–0.1)
Basophils Relative: 6.4 % — ABNORMAL HIGH (ref 0.0–3.0)
EOS ABS: 0.1 10*3/uL (ref 0.0–0.7)
Eosinophils Relative: 1.7 % (ref 0.0–5.0)
HCT: 33.5 % — ABNORMAL LOW (ref 36.0–46.0)
Hemoglobin: 10.6 g/dL — ABNORMAL LOW (ref 12.0–15.0)
Lymphocytes Relative: 33.2 % (ref 12.0–46.0)
Lymphs Abs: 2.8 10*3/uL (ref 0.7–4.0)
MCHC: 31.8 g/dL (ref 30.0–36.0)
MCV: 77.4 fl — AB (ref 78.0–100.0)
MONO ABS: 0.6 10*3/uL (ref 0.1–1.0)
Monocytes Relative: 7.3 % (ref 3.0–12.0)
NEUTROS ABS: 4.4 10*3/uL (ref 1.4–7.7)
Neutrophils Relative %: 51.4 % (ref 43.0–77.0)
Platelets: 301 10*3/uL (ref 150.0–400.0)
RBC: 4.33 Mil/uL (ref 3.87–5.11)
RDW: 15 % (ref 11.5–15.5)
WBC: 8.5 10*3/uL (ref 4.0–10.5)

## 2013-09-17 LAB — FERRITIN: Ferritin: 12.5 ng/mL (ref 10.0–291.0)

## 2013-09-19 ENCOUNTER — Encounter: Payer: Self-pay | Admitting: Internal Medicine

## 2013-09-19 ENCOUNTER — Other Ambulatory Visit: Payer: Self-pay | Admitting: Internal Medicine

## 2013-09-19 DIAGNOSIS — D509 Iron deficiency anemia, unspecified: Secondary | ICD-10-CM

## 2013-09-19 NOTE — Progress Notes (Signed)
Order placed for f/u cbc and ferritin.   

## 2013-09-20 ENCOUNTER — Encounter: Payer: Self-pay | Admitting: *Deleted

## 2013-09-20 ENCOUNTER — Other Ambulatory Visit: Payer: Self-pay | Admitting: Internal Medicine

## 2013-09-23 NOTE — Telephone Encounter (Signed)
Unread mychart message mailed to patient 

## 2013-09-27 ENCOUNTER — Telehealth: Payer: Self-pay | Admitting: Internal Medicine

## 2013-09-27 NOTE — Telephone Encounter (Signed)
I do agree with evaluation before treating to confirm diagnosis.  Agree with going ahead and being seen.

## 2013-09-27 NOTE — Telephone Encounter (Signed)
Patient going to UC at [facility] for evaluation.

## 2013-09-27 NOTE — Telephone Encounter (Signed)
Patient Information:  Caller Name: Kahlee  Phone: [phone]  Patient: Lori Le, Lori Le  Gender: Female  DOB: [birth year]  Age: 48 Years  PCP: Einar Pheasant  Pregnant: No  Office Follow Up:  Does the office need to follow up with this patient?: Yes  Instructions For The Office: OOT on vacation  RN Note:  She is on vacation in [place].   [place] [facility] [place]  Just wanted to check to see if you want her seen? or willing to call in [facility] to the [facility]?  Thanks  Symptoms  Reason For Call & Symptoms: Patient calling, has had high uric acid levels for years but never dx with gout.  Had a dull ache in her left great toe 6/26 and then progressed to pain/redness with swelling on the toe.  She has a limp when she walks.  Reviewed Health History In EMR: Yes  Reviewed Medications In EMR: Yes  Reviewed Allergies In EMR: Yes  Reviewed Surgeries / Procedures: Yes  Date of Onset of Symptoms: 09/24/2013 OB / GYN:  LMP: 09/13/2013  Guideline(s) Used:  Foot Pain  Disposition Per Guideline:   See Today in Office  Reason For Disposition Reached:   Severe pain (e.g., excruciating, unable to do any normal activities)  Advice Given:  N/A  RN Overrode Recommendation:  Patient Requests Prescription  She is OOT on vacation

## 2013-09-27 NOTE — Telephone Encounter (Signed)
Please advise 

## 2013-10-07 ENCOUNTER — Ambulatory Visit (INDEPENDENT_AMBULATORY_CARE_PROVIDER_SITE_OTHER): Payer: 59 | Admitting: *Deleted

## 2013-10-07 DIAGNOSIS — E538 Deficiency of other specified B group vitamins: Secondary | ICD-10-CM

## 2013-10-07 MED ORDER — CYANOCOBALAMIN 1000 MCG/ML IJ SOLN
1000.0000 ug | Freq: Once | INTRAMUSCULAR | Status: AC
  Administered 2013-10-07: 1000 ug via INTRAMUSCULAR

## 2013-10-12 ENCOUNTER — Other Ambulatory Visit: Payer: 59

## 2013-10-13 ENCOUNTER — Other Ambulatory Visit (INDEPENDENT_AMBULATORY_CARE_PROVIDER_SITE_OTHER): Payer: 59

## 2013-10-13 DIAGNOSIS — D509 Iron deficiency anemia, unspecified: Secondary | ICD-10-CM

## 2013-10-14 LAB — CBC WITH DIFFERENTIAL/PLATELET
BASOS PCT: 0.6 % (ref 0.0–3.0)
Basophils Absolute: 0.1 10*3/uL (ref 0.0–0.1)
EOS ABS: 0.1 10*3/uL (ref 0.0–0.7)
EOS PCT: 1 % (ref 0.0–5.0)
HCT: 40.1 % (ref 36.0–46.0)
Hemoglobin: 12.7 g/dL (ref 12.0–15.0)
LYMPHS PCT: 24.1 % (ref 12.0–46.0)
Lymphs Abs: 2.2 10*3/uL (ref 0.7–4.0)
MCHC: 31.7 g/dL (ref 30.0–36.0)
MCV: 83.3 fl (ref 78.0–100.0)
Monocytes Absolute: 0.5 10*3/uL (ref 0.1–1.0)
Monocytes Relative: 5.8 % (ref 3.0–12.0)
NEUTROS PCT: 68.5 % (ref 43.0–77.0)
Neutro Abs: 6.4 10*3/uL (ref 1.4–7.7)
PLATELETS: 246 10*3/uL (ref 150.0–400.0)
RBC: 4.81 Mil/uL (ref 3.87–5.11)
RDW: 20.8 % — ABNORMAL HIGH (ref 11.5–15.5)
WBC: 9.3 10*3/uL (ref 4.0–10.5)

## 2013-10-14 LAB — FERRITIN: FERRITIN: 20.3 ng/mL (ref 10.0–291.0)

## 2013-10-15 ENCOUNTER — Encounter: Payer: Self-pay | Admitting: Internal Medicine

## 2013-10-18 NOTE — Telephone Encounter (Signed)
Mailed unread message to pt  

## 2013-11-04 ENCOUNTER — Encounter: Payer: Self-pay | Admitting: Internal Medicine

## 2013-11-04 ENCOUNTER — Ambulatory Visit (INDEPENDENT_AMBULATORY_CARE_PROVIDER_SITE_OTHER): Payer: 59 | Admitting: Internal Medicine

## 2013-11-04 VITALS — BP 110/70 | HR 77 | Temp 98.2°F | Ht 67.0 in | Wt 224.2 lb

## 2013-11-04 DIAGNOSIS — E7800 Pure hypercholesterolemia, unspecified: Secondary | ICD-10-CM

## 2013-11-04 DIAGNOSIS — D039 Melanoma in situ, unspecified: Secondary | ICD-10-CM

## 2013-11-04 DIAGNOSIS — C439 Malignant melanoma of skin, unspecified: Secondary | ICD-10-CM

## 2013-11-04 DIAGNOSIS — E039 Hypothyroidism, unspecified: Secondary | ICD-10-CM

## 2013-11-04 DIAGNOSIS — F32A Depression, unspecified: Secondary | ICD-10-CM

## 2013-11-04 DIAGNOSIS — G609 Hereditary and idiopathic neuropathy, unspecified: Secondary | ICD-10-CM

## 2013-11-04 DIAGNOSIS — R609 Edema, unspecified: Secondary | ICD-10-CM

## 2013-11-04 DIAGNOSIS — G629 Polyneuropathy, unspecified: Secondary | ICD-10-CM

## 2013-11-04 DIAGNOSIS — I10 Essential (primary) hypertension: Secondary | ICD-10-CM

## 2013-11-04 DIAGNOSIS — Z9884 Bariatric surgery status: Secondary | ICD-10-CM

## 2013-11-04 DIAGNOSIS — K219 Gastro-esophageal reflux disease without esophagitis: Secondary | ICD-10-CM

## 2013-11-04 DIAGNOSIS — G43809 Other migraine, not intractable, without status migrainosus: Secondary | ICD-10-CM

## 2013-11-04 DIAGNOSIS — F329 Major depressive disorder, single episode, unspecified: Secondary | ICD-10-CM

## 2013-11-04 DIAGNOSIS — F3289 Other specified depressive episodes: Secondary | ICD-10-CM

## 2013-11-04 DIAGNOSIS — D649 Anemia, unspecified: Secondary | ICD-10-CM

## 2013-11-04 DIAGNOSIS — Z9109 Other allergy status, other than to drugs and biological substances: Secondary | ICD-10-CM

## 2013-11-04 MED ORDER — FUROSEMIDE 40 MG PO TABS: 40.0000 mg | ORAL_TABLET | Freq: Every day | ORAL | Status: DC

## 2013-11-04 MED ORDER — TRIAMTERENE-HCTZ 37.5-25 MG PO TABS: 1.0000 | ORAL_TABLET | Freq: Every day | ORAL | Status: DC

## 2013-11-04 NOTE — Progress Notes (Signed)
Pre visit review using our clinic review tool, if applicable. No additional management support is needed unless otherwise documented below in the visit note. 

## 2013-11-07 ENCOUNTER — Encounter: Payer: Self-pay | Admitting: Internal Medicine

## 2013-11-07 NOTE — Assessment & Plan Note (Signed)
Doing well on no medication.  Follow.  Some increased stress as outlined.  Feels she is handling things relatively well.  Does not desire any intervention at this time.  Follow.

## 2013-11-07 NOTE — Assessment & Plan Note (Signed)
Describes worsening swelling at times.  Worse at the end of the day.  Support hose.  Increased fluid retention with her periods.  Follow.

## 2013-11-07 NOTE — Assessment & Plan Note (Signed)
On omeprazole.  

## 2013-11-07 NOTE — Assessment & Plan Note (Signed)
Followed by Dr Phillip Heal.  Up to date with skin checks.  Will discuss with her at her next visit if cxr,etc warranted.  Last evaluation she had basal cell removed.  Follow.

## 2013-11-07 NOTE — Assessment & Plan Note (Signed)
B12 low.  Started on B12 injections.  Better.  Follow.

## 2013-11-07 NOTE — Assessment & Plan Note (Signed)
Low cholesterol diet and exercise.  Follow lipid panel.   

## 2013-11-07 NOTE — Assessment & Plan Note (Signed)
Doing well.  Takes zyrtec prn.  Has seen Dr Tami Ribas.  Taking zyrtec daily lately.  Drying her out.  Will hold temporarily.  Saline nasal flushes.  Follow closely.  Notify me if pain persists.

## 2013-11-07 NOTE — Progress Notes (Signed)
Subjective:    Patient ID: Lori Le, female    DOB: 1966/02/20, 48 y.o.   MRN: [id number]  HPI 48 year old female with past history of hypercholesterolemia, hypothyroidism and depression.  She has a documented history of hypertension, but states her blood pressure has been doing well lately.  Is on two fluid pills for "fluid accumulation".  States she has tried to get off of these and has issues with swelling - when off these medications.  She comes in today for a scheduled follow up.  I tried to cut back her triam/hctz secondary to an elevated uric acid level.  She reported increased swelling only after a few days of being off the medication.  Back on now. Had a recent gout attack.  Treated with prednisone.  Better.  We again discussed her medications.  She wants to remain on her current regimen. Has been seeing Dr Amalia Hailey for her GYN exams.  Previously had an abnormal pap.  Is s/p colposcopy.  States everything checked out fine.  Pap smears since have been normal.  Sees Dr Eddie Dibbles for her thyroid issues.  Has issues with allergies.  Has seen Dr Tami Ribas. Takes zyrtec.  Has been taking the zyrtec daily lately.  Feels it has dried her out too much.  Nose hurts.  Denies any injury or trauma.  Has had allergy testing.  She has a history of migraines.  Has visual auras (tunnel vision and bright lights).  Will then notice headache about 20 minutes later.  These are doing better.  Not a significant issue for her now.  She has had gastric bypass.  Initially had lap band.  Had persistent emesis, so band removed and she had bypass.  Top weight was 307 pounds.  She does exercise.  Walks.  Is frustrated because her weight has stabilized.  We discussed diet and exercise today.   Has a history of depression.  Doing relatively well now.  Still with increased stress at work and now with increased stress at home (in her relationship).  Feels she is handling things relatively well.  She reported decreased energy.  See previous  note.  Home sleep test negative for sleep apnea.  Overall appears to be doing better.  hgb has improved.  On iron.  Has IUD now.  Doing well.  Sees Dr Marcelline Mates 08/26/13.       Past Medical History  Diagnosis Date  . Hypertension   . Hyperlipidemia   . Allergy   . GERD (gastroesophageal reflux disease)   . Arthritis   . Melanoma in situ     left shoulder  . Depression   . History of eating disorder   . Migraine   . Hypothyroidism   . PCOS (polycystic ovarian syndrome)     Outpatient Encounter Prescriptions as of 11/04/2013  Medication Sig  . furosemide (LASIX) 40 MG tablet Take 1 tablet (40 mg total) by mouth daily.  [name] levothyroxine (SYNTHROID) 137 MCG tablet Take 1 tablet (137 mcg total) by mouth daily before breakfast.  . omeprazole (PRILOSEC) 20 MG capsule Take 1 capsule (20 mg total) by mouth daily.  . potassium chloride (K-DUR) 10 MEQ tablet Take 1 tablet (10 mEq total) by mouth 2 (two) times daily.  [name] triamterene-hydrochlorothiazide (MAXZIDE-25) 37.5-25 MG per tablet Take 1 tablet by mouth daily.  . [DISCONTINUED] furosemide (LASIX) 40 MG tablet TAKE ONE TABLET BY MOUTH ONCE DAILY  . [DISCONTINUED] triamterene-hydrochlorothiazide (MAXZIDE-25) 37.5-25 MG per tablet Take 1 tablet by mouth daily.  Review of Systems Patient denies any headache, lightheadedness or dizziness now.  Does have a history of migraines as outlined.  Not a significant issue currently.   No chest pain, tightness or palpitations.  No cardiac symptoms with increased activity or exertion.  No increased shortness of breath, cough or congestion.  Allergy symptoms have been relatively well controlled.  Increased dryness now.  Nose hurts as outlined.   No nausea or vomiting.  Takes omeprazole.  No increased acid reflux reported.  No abdominal pain or cramping.  No bowel change, such as diarrhea, constipation, BRBPR or melana.  No urine change.  No significant depression.  Has an IUD in place now.  Off ocp's.  Increased  stress.  Recent gout flare.       Objective:   Physical Exam  Filed Vitals:   11/04/13 1613  BP: 110/70  Pulse: 77  Temp: 98.2 F (36.8 C)   Blood pressure recheck:  52/31  48 year old female in no acute distress.   HEENT:  Nares- clear.  Oropharynx - without lesions. NECK:  Supple.  Nontender.  No audible bruit.  HEART:  Appears to be regular. LUNGS:  No crackles or wheezing audible.  Respirations even and unlabored.  RADIAL PULSE:  Equal bilaterally.  ABDOMEN:  Soft, nontender.  Bowel sounds present and normal.  No audible abdominal bruit.  EXTREMITIES:  Sine increased edema present. Overall appears to be stable.   DP pulses palpable and equal bilaterally.         Assessment & Plan:  ELEVATED URIC ACID LEVEL.  Noted on recent labs.  She is on triam/hctz.  Had gout flare recently.  Treated with prednisone.  Resolved.  Tried to decrease triam/hctz.  Did not tolerate.  Follow.  Does not want to adjust her medication.   HEALTH MAINTENANCE.  Had her breast, pelvic and pap smear through GYN (Dr Amalia Hailey).   States was ok.  Had her mammogram 12/13 - ok (per her report).  Still need results of 2014 mammogram.

## 2013-11-07 NOTE — Assessment & Plan Note (Signed)
Top weight was 307.  Continue diet adjustment and exercise.  B12 low.  Receiving B12 injections.   Discussed diet and exercise today.  Gave her a copy of Dr Intel.  She will start working on this as well.  Exercise.  Follow.

## 2013-11-07 NOTE — Assessment & Plan Note (Signed)
Blood pressure doing well.  Follow.  

## 2013-11-07 NOTE — Assessment & Plan Note (Signed)
Has migraines as outlined.  Not a significant issue for her now.  Follow.

## 2013-11-07 NOTE — Assessment & Plan Note (Signed)
On synthroid.  Sees Dr Eddie Dibbles.  Follow.

## 2013-11-09 ENCOUNTER — Ambulatory Visit (INDEPENDENT_AMBULATORY_CARE_PROVIDER_SITE_OTHER): Payer: 59 | Admitting: *Deleted

## 2013-11-09 DIAGNOSIS — E538 Deficiency of other specified B group vitamins: Secondary | ICD-10-CM

## 2013-11-09 MED ORDER — CYANOCOBALAMIN 1000 MCG/ML IJ SOLN
1000.0000 ug | Freq: Once | INTRAMUSCULAR | Status: AC
  Administered 2013-11-09: 1000 ug via INTRAMUSCULAR

## 2013-12-09 ENCOUNTER — Encounter: Payer: Self-pay | Admitting: Internal Medicine

## 2013-12-09 ENCOUNTER — Ambulatory Visit (INDEPENDENT_AMBULATORY_CARE_PROVIDER_SITE_OTHER): Payer: 59 | Admitting: Internal Medicine

## 2013-12-09 VITALS — BP 120/80 | HR 96 | Temp 98.4°F | Ht 67.0 in | Wt 221.5 lb

## 2013-12-09 DIAGNOSIS — E039 Hypothyroidism, unspecified: Secondary | ICD-10-CM

## 2013-12-09 DIAGNOSIS — F3289 Other specified depressive episodes: Secondary | ICD-10-CM

## 2013-12-09 DIAGNOSIS — Z9884 Bariatric surgery status: Secondary | ICD-10-CM

## 2013-12-09 DIAGNOSIS — I10 Essential (primary) hypertension: Secondary | ICD-10-CM

## 2013-12-09 DIAGNOSIS — F329 Major depressive disorder, single episode, unspecified: Secondary | ICD-10-CM

## 2013-12-09 DIAGNOSIS — E7800 Pure hypercholesterolemia, unspecified: Secondary | ICD-10-CM

## 2013-12-09 DIAGNOSIS — F32A Depression, unspecified: Secondary | ICD-10-CM

## 2013-12-09 DIAGNOSIS — R609 Edema, unspecified: Secondary | ICD-10-CM

## 2013-12-09 MED ORDER — BUPROPION HCL ER (XL) 150 MG PO TB24: 150.0000 mg | ORAL_TABLET | Freq: Every day | ORAL | Status: DC

## 2013-12-09 NOTE — Progress Notes (Signed)
Pre visit review using our clinic review tool, if applicable. No additional management support is needed unless otherwise documented below in the visit note. 

## 2013-12-09 NOTE — Progress Notes (Signed)
Subjective:    Patient ID: Lori Le, female    DOB: 1965/09/10, 48 y.o.   MRN: [id number]  HPI 48 year old female with past history of hypercholesterolemia, hypothyroidism and depression.  She has a documented history of hypertension, but states her blood pressure has been doing well lately.  Is on two fluid pills for "fluid accumulation".  Has tried to get off of these and has issues with swelling - when off these medications.  She comes in today to discuss her weight and appeal for her work.  Her insurance premiums will be higher if her BMI is not under 30.  She has had problems with her weight. She has had gastric bypass.  Initially had lap band.  Had persistent emesis, so band removed and she had bypass.  Top weight was 307 pounds. She has lost (from her top weight) almost 85 pounds. She does exercise.  Walks. She has joined a gym.  Planning to attend an exercise program.  We discussed diet adjustment.  Is agreeable to referral to [facility].  Has a history of depression.  Still with increased stress at work and now with increased stress at home (in her relationship).  Feels she needs something to help.  Discussed medication options.  She would like to try wellbutrin.  Home sleep test negative for sleep apnea.  On iron.  Has IUD now.  Doing well.         Past Medical History  Diagnosis Date  . Hypertension   . Hyperlipidemia   . Allergy   . GERD (gastroesophageal reflux disease)   . Arthritis   . Melanoma in situ     left shoulder  . Depression   . History of eating disorder   . Migraine   . Hypothyroidism   . PCOS (polycystic ovarian syndrome)     Outpatient Encounter Prescriptions as of 12/09/2013  Medication Sig  . furosemide (LASIX) 40 MG tablet Take 1 tablet (40 mg total) by mouth daily.  [name] levothyroxine (SYNTHROID) 137 MCG tablet Take 1 tablet (137 mcg total) by mouth daily before breakfast.  . omeprazole (PRILOSEC) 20 MG capsule Take 1 capsule (20 mg total) by mouth daily.   . potassium chloride (K-DUR) 10 MEQ tablet Take 1 tablet (10 mEq total) by mouth 2 (two) times daily.  . predniSONE (DELTASONE) 10 MG tablet Take 10 mg by mouth. Take a 3 day taper once a month  . triamterene-hydrochlorothiazide (MAXZIDE-25) 37.5-25 MG per tablet Take 1 tablet by mouth daily.    Review of Systems Patient denies any headache, lightheadedness or dizziness now.  Does have a history of migraines as outlined.  Not a significant issue currently.   No chest pain, tightness or palpitations.  No cardiac symptoms with increased activity or exertion.  No increased shortness of breath, cough or congestion.  Allergy symptoms have been relatively well controlled.  No nausea or vomiting.  Takes omeprazole.  No increased acid reflux reported.  No abdominal pain or cramping.  No bowel change, such as diarrhea, constipation, BRBPR or melana.  No urine change.   Has an IUD in place now.  Off ocp's.  Increased stress.  She feels she needs to be on something to help.  Discussed weight loss.  She has joined a gym.  Agreeable to [facility] referral.        Objective:   Physical Exam  Filed Vitals:   12/09/13 1402  BP: 120/80  Pulse: 96  Temp: 98.4 F (36.9 C)  48 year old female in no acute distress.   HEENT:  Nares- clear.  Oropharynx - without lesions. NECK:  Supple.  Nontender.  No audible bruit.  HEART:  Appears to be regular. LUNGS:  No crackles or wheezing audible.  Respirations even and unlabored.  RADIAL PULSE:  Equal bilaterally.  ABDOMEN:  Soft, nontender.  Bowel sounds present and normal.  No audible abdominal bruit.  EXTREMITIES:  No increased edema present. Overall appears to be stable.   DP pulses palpable and equal bilaterally.         Assessment & Plan:  ELEVATED URIC ACID LEVEL.  Noted on recent labs.  She is on triam/hctz.  Had gout flare recently.  Treated with prednisone.  Resolved.  Tried to decrease triam/hctz.  Did not tolerate.  Follow.  Does not want to adjust her  medication.   HEALTH MAINTENANCE.  Had her breast, pelvic and pap smear through GYN (Dr Amalia Hailey).   States was ok.  Had her mammogram 12/13 - ok (per her report).  Still need results of 2014 mammogram.

## 2013-12-10 ENCOUNTER — Ambulatory Visit (INDEPENDENT_AMBULATORY_CARE_PROVIDER_SITE_OTHER): Payer: 59 | Admitting: *Deleted

## 2013-12-10 DIAGNOSIS — E538 Deficiency of other specified B group vitamins: Secondary | ICD-10-CM

## 2013-12-10 MED ORDER — CYANOCOBALAMIN 1000 MCG/ML IJ SOLN
1000.0000 ug | Freq: Once | INTRAMUSCULAR | Status: AC
  Administered 2013-12-10: 1000 ug via INTRAMUSCULAR

## 2013-12-13 ENCOUNTER — Encounter: Payer: Self-pay | Admitting: Internal Medicine

## 2013-12-13 NOTE — Assessment & Plan Note (Signed)
Low cholesterol diet and exercise.  Follow lipid panel.   

## 2013-12-13 NOTE — Assessment & Plan Note (Signed)
Describes worsening swelling at times.  Worse at the end of the day.  Support hose.  Follow.  Appears to be stable.

## 2013-12-13 NOTE — Assessment & Plan Note (Signed)
Blood pressure doing well.  Follow.  

## 2013-12-13 NOTE — Assessment & Plan Note (Signed)
Increased stress as outlined.  Increased issues at home.  She would like to start medication.  Start wellbutrin XL 150mg  q day.  Follow closely.  No suicidal ideations.  Will notify me if desires counseling or psych referral.

## 2013-12-13 NOTE — Assessment & Plan Note (Signed)
Top weight was 307.  Continue diet adjustment and exercise.  B12 low.  Receiving B12 injections.   Discussed diet and exercise again today.  She has been given a copy of Dr Intel.  She joined a gym.  Agreed to referral to [facility].

## 2013-12-13 NOTE — Assessment & Plan Note (Signed)
On synthroid.  Sees Dr Eddie Dibbles.  Follow.

## 2013-12-16 ENCOUNTER — Other Ambulatory Visit: Payer: 59

## 2013-12-20 ENCOUNTER — Other Ambulatory Visit (INDEPENDENT_AMBULATORY_CARE_PROVIDER_SITE_OTHER): Payer: 59

## 2013-12-20 DIAGNOSIS — D649 Anemia, unspecified: Secondary | ICD-10-CM

## 2013-12-20 DIAGNOSIS — E039 Hypothyroidism, unspecified: Secondary | ICD-10-CM

## 2013-12-20 DIAGNOSIS — E7800 Pure hypercholesterolemia, unspecified: Secondary | ICD-10-CM

## 2013-12-20 DIAGNOSIS — R609 Edema, unspecified: Secondary | ICD-10-CM

## 2013-12-20 DIAGNOSIS — I10 Essential (primary) hypertension: Secondary | ICD-10-CM

## 2013-12-20 LAB — COMPREHENSIVE METABOLIC PANEL
ALBUMIN: 3.8 g/dL (ref 3.5–5.2)
ALT: 17 U/L (ref 0–35)
AST: 11 U/L (ref 0–37)
Alkaline Phosphatase: 45 U/L (ref 39–117)
BUN: 18 mg/dL (ref 6–23)
CALCIUM: 9.1 mg/dL (ref 8.4–10.5)
CHLORIDE: 102 meq/L (ref 96–112)
CO2: 30 meq/L (ref 19–32)
Creatinine, Ser: 0.7 mg/dL (ref 0.4–1.2)
GFR: 96.55 mL/min (ref >60.00)
Glucose, Bld: 100 mg/dL — ABNORMAL HIGH (ref 70–99)
POTASSIUM: 3.5 meq/L (ref 3.5–5.1)
Sodium: 140 mEq/L (ref 135–145)
Total Bilirubin: 0.8 mg/dL (ref 0.2–1.2)
Total Protein: 6.8 g/dL (ref 6.0–8.3)

## 2013-12-20 LAB — CBC WITH DIFFERENTIAL/PLATELET
BASOS ABS: 0 10*3/uL (ref 0.0–0.1)
Basophils Relative: 0.4 % (ref 0.0–3.0)
Eosinophils Absolute: 0.1 10*3/uL (ref 0.0–0.7)
Eosinophils Relative: 2 % (ref 0.0–5.0)
HCT: 42.3 % (ref 36.0–46.0)
Hemoglobin: 14.1 g/dL (ref 12.0–15.0)
LYMPHS PCT: 26.5 % (ref 12.0–46.0)
Lymphs Abs: 1.9 10*3/uL (ref 0.7–4.0)
MCHC: 33.3 g/dL (ref 30.0–36.0)
MCV: 89.1 fl (ref 78.0–100.0)
MONOS PCT: 6.3 % (ref 3.0–12.0)
Monocytes Absolute: 0.4 10*3/uL (ref 0.1–1.0)
Neutro Abs: 4.5 10*3/uL (ref 1.4–7.7)
Neutrophils Relative %: 64.8 % (ref 43.0–77.0)
Platelets: 219 10*3/uL (ref 150.0–400.0)
RBC: 4.75 Mil/uL (ref 3.87–5.11)
RDW: 14 % (ref 11.5–15.5)
WBC: 7 10*3/uL (ref 4.0–10.5)

## 2013-12-20 LAB — LIPID PANEL
Cholesterol: 181 mg/dL (ref 0–200)
HDL: 43.1 mg/dL (ref >39.00)
LDL Cholesterol: 100 mg/dL — ABNORMAL HIGH (ref 0–99)
NONHDL: 137.9
TRIGLYCERIDES: 191 mg/dL — AB (ref 0.0–149.0)
Total CHOL/HDL Ratio: 4
VLDL: 38.2 mg/dL (ref 0.0–40.0)

## 2013-12-21 LAB — FERRITIN: Ferritin: 30.6 ng/mL (ref 10.0–291.0)

## 2013-12-21 LAB — TSH: TSH: 0.8 u[IU]/mL (ref 0.35–4.50)

## 2013-12-22 ENCOUNTER — Encounter: Payer: Self-pay | Admitting: Internal Medicine

## 2013-12-22 ENCOUNTER — Ambulatory Visit: Payer: Self-pay | Admitting: Internal Medicine

## 2013-12-30 ENCOUNTER — Ambulatory Visit: Payer: Self-pay | Admitting: Internal Medicine

## 2014-01-11 ENCOUNTER — Ambulatory Visit: Payer: 59

## 2014-01-18 ENCOUNTER — Ambulatory Visit: Payer: 59

## 2014-01-20 ENCOUNTER — Ambulatory Visit (INDEPENDENT_AMBULATORY_CARE_PROVIDER_SITE_OTHER): Payer: 59

## 2014-01-20 DIAGNOSIS — E538 Deficiency of other specified B group vitamins: Secondary | ICD-10-CM

## 2014-01-20 MED ORDER — CYANOCOBALAMIN 1000 MCG/ML IJ SOLN: 1000.0000 ug | Freq: Once | INTRAMUSCULAR | Status: DC

## 2014-01-20 MED ORDER — CYANOCOBALAMIN 1000 MCG/ML IJ SOLN
1000.0000 ug | Freq: Once | INTRAMUSCULAR | Status: AC
  Administered 2014-01-20: 1000 ug via INTRAMUSCULAR

## 2014-01-30 ENCOUNTER — Ambulatory Visit: Payer: Self-pay | Admitting: Internal Medicine

## 2014-02-07 ENCOUNTER — Other Ambulatory Visit: Payer: Self-pay | Admitting: *Deleted

## 2014-02-07 MED ORDER — TRIAMTERENE-HCTZ 37.5-25 MG PO TABS: 1.0000 | ORAL_TABLET | Freq: Every day | ORAL | Status: DC

## 2014-02-07 MED ORDER — BUPROPION HCL ER (XL) 150 MG PO TB24: 150.0000 mg | ORAL_TABLET | Freq: Every day | ORAL | Status: DC

## 2014-02-07 MED ORDER — LEVOTHYROXINE SODIUM 137 MCG PO TABS: 137.0000 ug | ORAL_TABLET | Freq: Every day | ORAL | Status: DC

## 2014-02-08 ENCOUNTER — Encounter: Payer: Self-pay | Admitting: Internal Medicine

## 2014-02-09 ENCOUNTER — Telehealth: Payer: Self-pay | Admitting: Internal Medicine

## 2014-02-09 NOTE — Telephone Encounter (Signed)
Dr. Nicki Reaper can this patient have a tetanus vaccine with her B-12 on 11.24.15. Please advise.

## 2014-02-09 NOTE — Telephone Encounter (Signed)
Yes.  If she is due, ok.  It may be cheaper for her to get at drug store.  I don't know.

## 2014-02-09 NOTE — Telephone Encounter (Signed)
Please advise 

## 2014-02-22 ENCOUNTER — Ambulatory Visit (INDEPENDENT_AMBULATORY_CARE_PROVIDER_SITE_OTHER): Payer: 59

## 2014-02-22 DIAGNOSIS — E538 Deficiency of other specified B group vitamins: Secondary | ICD-10-CM

## 2014-02-22 DIAGNOSIS — Z23 Encounter for immunization: Secondary | ICD-10-CM

## 2014-02-22 MED ORDER — CYANOCOBALAMIN 1000 MCG/ML IJ SOLN
1000.0000 ug | Freq: Once | INTRAMUSCULAR | Status: AC
  Administered 2014-02-22: 1000 ug via INTRAMUSCULAR

## 2014-03-07 ENCOUNTER — Ambulatory Visit: Payer: 59 | Admitting: Internal Medicine

## 2014-03-10 ENCOUNTER — Ambulatory Visit (INDEPENDENT_AMBULATORY_CARE_PROVIDER_SITE_OTHER): Payer: 59 | Admitting: Internal Medicine

## 2014-03-10 ENCOUNTER — Encounter: Payer: Self-pay | Admitting: Internal Medicine

## 2014-03-10 VITALS — BP 112/70 | HR 80 | Temp 98.5°F | Ht 67.0 in | Wt 221.5 lb

## 2014-03-10 DIAGNOSIS — M109 Gout, unspecified: Secondary | ICD-10-CM

## 2014-03-10 DIAGNOSIS — I10 Essential (primary) hypertension: Secondary | ICD-10-CM

## 2014-03-10 DIAGNOSIS — E039 Hypothyroidism, unspecified: Secondary | ICD-10-CM

## 2014-03-10 DIAGNOSIS — M10072 Idiopathic gout, left ankle and foot: Secondary | ICD-10-CM

## 2014-03-10 DIAGNOSIS — F32A Depression, unspecified: Secondary | ICD-10-CM

## 2014-03-10 DIAGNOSIS — E7800 Pure hypercholesterolemia, unspecified: Secondary | ICD-10-CM

## 2014-03-10 DIAGNOSIS — M10079 Idiopathic gout, unspecified ankle and foot: Secondary | ICD-10-CM

## 2014-03-10 DIAGNOSIS — K219 Gastro-esophageal reflux disease without esophagitis: Secondary | ICD-10-CM

## 2014-03-10 DIAGNOSIS — E669 Obesity, unspecified: Secondary | ICD-10-CM

## 2014-03-10 DIAGNOSIS — F329 Major depressive disorder, single episode, unspecified: Secondary | ICD-10-CM

## 2014-03-10 DIAGNOSIS — Z9884 Bariatric surgery status: Secondary | ICD-10-CM

## 2014-03-10 NOTE — Progress Notes (Signed)
Pre visit review using our clinic review tool, if applicable. No additional management support is needed unless otherwise documented below in the visit note. 

## 2014-03-11 LAB — TSH: TSH: 1.23 u[IU]/mL (ref 0.35–4.50)

## 2014-03-11 LAB — URIC ACID: URIC ACID, SERUM: 10.4 mg/dL — AB (ref 2.4–7.0)

## 2014-03-12 ENCOUNTER — Encounter: Payer: Self-pay | Admitting: Internal Medicine

## 2014-03-12 MED ORDER — PREDNISONE 10 MG PO TABS: ORAL_TABLET | ORAL | Status: DC

## 2014-03-14 ENCOUNTER — Other Ambulatory Visit: Payer: Self-pay | Admitting: *Deleted

## 2014-03-14 MED ORDER — PREDNISONE 10 MG PO TABS
ORAL_TABLET | ORAL | Status: DC
Start: 2014-03-14 — End: 2014-06-09

## 2014-03-17 MED ORDER — ALLOPURINOL 300 MG PO TABS: 300.0000 mg | ORAL_TABLET | Freq: Every day | ORAL | Status: DC

## 2014-03-17 NOTE — Telephone Encounter (Signed)
rx sent in for allopurinol 300mg  q day #30 with 2 refills.

## 2014-03-20 ENCOUNTER — Encounter: Payer: Self-pay | Admitting: Internal Medicine

## 2014-03-20 DIAGNOSIS — Z6837 Body mass index (BMI) 37.0-37.9, adult: Secondary | ICD-10-CM | POA: Insufficient documentation

## 2014-03-20 DIAGNOSIS — M109 Gout, unspecified: Secondary | ICD-10-CM | POA: Insufficient documentation

## 2014-03-20 NOTE — Progress Notes (Signed)
Subjective:    Patient ID: Lori Le, female    DOB: 08/04/1965, 48 y.o.   MRN: [id number]  HPI 48 year old female with past history of hypercholesterolemia, hypothyroidism and depression.  She has a documented history of hypertension, but states her blood pressure has been doing well lately.  Is on two fluid pills for "fluid accumulation".  Has tried to get off of these and has issues with swelling - when off these medications.  She comes in today for a scheduled follow up.   She has had problems with her weight. She has had gastric bypass.  Initially had lap band.  Had persistent emesis, so band removed and she had bypass.  Top weight was 307 pounds. She has lost (from her top weight) almost 85 pounds. She does exercise.  Walks. She has joined a gym.   We have discussed diet adjustment.   Has a history of depression.  Still with increased stress at work and now with increased stress at home (in her relationship).  See last note for details.  Was started on wellbutrin.  Home sleep test negative for sleep apnea.  On iron.  Has IUD now.  She reports a recent gout flare.  Involves her great toe.  She started on prednisone.  Is better.  Unable to get her off her hctz (fluid pills).  We discussed treatment with allopurinol.  She is concerned about her thyroid.  States her hair is falling out.  Voice different at times.        Past Medical History  Diagnosis Date  . Hypertension   . Hyperlipidemia   . Allergy   . GERD (gastroesophageal reflux disease)   . Arthritis   . Melanoma in situ     left shoulder  . Depression   . History of eating disorder   . Migraine   . Hypothyroidism   . PCOS (polycystic ovarian syndrome)     Outpatient Encounter Prescriptions as of 03/10/2014  Medication Sig  . buPROPion (WELLBUTRIN XL) 150 MG 24 hr tablet Take 1 tablet (150 mg total) by mouth daily.  . furosemide (LASIX) 40 MG tablet Take 1 tablet (40 mg total) by mouth daily.  [name] levothyroxine (SYNTHROID)  137 MCG tablet Take 1 tablet (137 mcg total) by mouth daily before breakfast.  . omeprazole (PRILOSEC) 20 MG capsule Take 1 capsule (20 mg total) by mouth daily.  . potassium chloride (K-DUR) 10 MEQ tablet Take 1 tablet (10 mEq total) by mouth 2 (two) times daily.  [name] triamterene-hydrochlorothiazide (MAXZIDE-25) 37.5-25 MG per tablet Take 1 tablet by mouth daily.  . [DISCONTINUED] predniSONE (DELTASONE) 10 MG tablet Take 10 mg by mouth. Take a 3 day taper once a month  . [DISCONTINUED] predniSONE (DELTASONE) 10 MG tablet Take 6 tablets x 1 day and then decrease by one tablet per day until down to zero mg.    Review of Systems Patient denies any headache, lightheadedness or dizziness. Does have a history of migraines as outlined.  Not a significant issue currently.   No chest pain, tightness or palpitations.  No cardiac symptoms with increased activity or exertion.  No increased shortness of breath, cough or congestion.  Allergy symptoms have been relatively well controlled.  No nausea or vomiting.  Takes omeprazole.  No increased acid reflux reported.  No abdominal pain or cramping.  No bowel change, such as diarrhea, constipation, BRBPR or melana.  No urine change.   Has an IUD in place now.  Off ocp's.  Increased stress.  On wellbutrin.  Overall doing relatively well.   Discussed weight loss.  She has joined a gym.  Recent gout flare as outlined.        Objective:   Physical Exam  Filed Vitals:   03/10/14 1533  BP: 112/70  Pulse: 80  Temp: 98.5 F (36.9 C)   Blood pressure recheck:  23/74  48 year old female in no acute distress.   HEENT:  Nares- clear.  Oropharynx - without lesions. NECK:  Supple.  Nontender.  No audible bruit.  HEART:  Appears to be regular. LUNGS:  No crackles or wheezing audible.  Respirations even and unlabored.  RADIAL PULSE:  Equal bilaterally.  ABDOMEN:  Soft, nontender.  Bowel sounds present and normal.  No audible abdominal bruit.  EXTREMITIES:  No  increased edema present. Overall appears to be stable.   DP pulses palpable and equal bilaterally.          Assessment & Plan:  1. Hypothyroidism, unspecified hypothyroidism type Concern over level.  - TSH  2. Acute gout of left foot, unspecified cause See below.   - Uric acid  3. Obesity (BMI 30-39.9) Diet and exercise.    4. Essential hypertension, benign Blood pressure doing well.    5. Gastroesophageal reflux disease, esophagitis presence not specified Controlled on omeprazole.    6. Depression On wellbutrin.  Stable.  Follow.    7. Hypercholesterolemia Low cholesterol diet and exercise.  Follow.  Lab Results  Component Value Date   CHOL 181 12/20/2013   HDL 43.10 12/20/2013   LDLCALC 100* 12/20/2013   LDLDIRECT 95.1 03/15/2013   TRIG 191.0* 12/20/2013   CHOLHDL 4 12/20/2013    8. H/O gastric bypass Has lost approximately 85 pounds.  Follow.    9. Gout of foot, unspecified cause, unspecified chronicity, unspecified laterality On prednisone.  See below.  Check uric acid level.  Desires not to change her fluid pills.    ELEVATED URIC ACID LEVEL.  Noted on recent labs.  She is on triam/hctz.  Had gout flare recently.  Treated with prednisone. Resolved.  Tried to decrease triam/hctz.  Did not tolerate.  Now with another flare.  Discussed changing bp meds.  She declines.  On prednisone now.  Better.  Recheck uric acid.  Consider allopurinol after flare.    LEFT LEG LESION.  Dermatology.  She will schedule.    HEALTH MAINTENANCE.  Had her breast, pelvic and pap smear through GYN (Dr Amalia Hailey).   States was ok.  States up to date with her mammogram.  Need results.

## 2014-03-29 ENCOUNTER — Ambulatory Visit: Payer: 59

## 2014-04-04 ENCOUNTER — Ambulatory Visit (INDEPENDENT_AMBULATORY_CARE_PROVIDER_SITE_OTHER): Payer: 59 | Admitting: Internal Medicine

## 2014-04-04 ENCOUNTER — Encounter: Payer: Self-pay | Admitting: Internal Medicine

## 2014-04-04 VITALS — BP 110/72 | HR 71 | Temp 97.9°F | Wt 228.0 lb

## 2014-04-04 DIAGNOSIS — J029 Acute pharyngitis, unspecified: Secondary | ICD-10-CM

## 2014-04-04 LAB — POCT RAPID STREP A (OFFICE): Rapid Strep A Screen: NEGATIVE

## 2014-04-04 LAB — MONONUCLEOSIS SCREEN: Mono Screen: NEGATIVE

## 2014-04-04 MED ORDER — AMOXICILLIN 875 MG PO TABS: 875.0000 mg | ORAL_TABLET | Freq: Two times a day (BID) | ORAL | Status: DC

## 2014-04-04 NOTE — Addendum Note (Signed)
Addended by: Lurlean Nanny on: 04/04/2014 09:39 AM   Modules accepted: Orders

## 2014-04-04 NOTE — Patient Instructions (Signed)

## 2014-04-04 NOTE — Progress Notes (Signed)
Pre visit review using our clinic review tool, if applicable. No additional management support is needed unless otherwise documented below in the visit note. 

## 2014-04-04 NOTE — Progress Notes (Signed)
Subjective:    Patient ID: Lori Le, female    DOB: May 11, 1965, 49 y.o.   MRN: [id number]  HPI  Pt presents to the clinic today with c/o a sore throat. She noticed this 1 week ago. She reports she also noted an abscess on the left side of her throat. Pus has drained from the area. She has had some associated left ear pain and fever as well. She has pain with swallowing with eating or drinking She has tried salt water gargles without much relief. She has not had sick contacts that she is aware of. She does have seasonal allergies but denies breathing problems. She does report history of Mono 12 years ago.  Review of Systems      Past Medical History  Diagnosis Date  . Hypertension   . Hyperlipidemia   . Allergy   . GERD (gastroesophageal reflux disease)   . Arthritis   . Melanoma in situ     left shoulder  . Depression   . History of eating disorder   . Migraine   . Hypothyroidism   . PCOS (polycystic ovarian syndrome)     Current Outpatient Prescriptions  Medication Sig Dispense Refill  . allopurinol (ZYLOPRIM) 300 MG tablet Take 1 tablet (300 mg total) by mouth daily. 30 tablet 2  . buPROPion (WELLBUTRIN XL) 150 MG 24 hr tablet Take 1 tablet (150 mg total) by mouth daily. 30 tablet 2  . furosemide (LASIX) 40 MG tablet Take 1 tablet (40 mg total) by mouth daily. 90 tablet 1  . levothyroxine (SYNTHROID) 137 MCG tablet Take 1 tablet (137 mcg total) by mouth daily before breakfast. 30 tablet 11  . omeprazole (PRILOSEC) 20 MG capsule Take 1 capsule (20 mg total) by mouth daily. 90 capsule 1  . potassium chloride (K-DUR) 10 MEQ tablet Take 1 tablet (10 mEq total) by mouth 2 (two) times daily. 180 tablet 1  . predniSONE (DELTASONE) 10 MG tablet Take 6 tablets x 1 day and then decrease by one tablet per day until down to zero mg. 21 tablet 0  . triamterene-hydrochlorothiazide (MAXZIDE-25) 37.5-25 MG per tablet Take 1 tablet by mouth daily. 90 tablet 1   No current  facility-administered medications for this visit.    Allergies  Allergen Reactions  . Influenza Vaccines Hives  . Neosporin + Pain Relief Max St [Neomy-Bacit-Polymyx-Pramoxine] Rash    Family History  Problem Relation Age of Onset  . Arthritis Mother   . Hyperlipidemia Mother   . Hypertension Mother   . Diabetes Mother   . Arthritis Father   . Hyperlipidemia Father   . Hypertension Father   . Mental illness Father   . Diabetes Father   . Arthritis Maternal Grandmother   . Cancer Maternal Grandmother     breast cancer  . Hyperlipidemia Maternal Grandmother   . Hypertension Maternal Grandmother   . Arthritis Maternal Grandfather   . Hyperlipidemia Maternal Grandfather   . Hypertension Maternal Grandfather   . Heart disease Maternal Grandfather     heart attack  . Breast cancer      maternal and paternal grandmother    History   Social History  . Marital Status: Married    Spouse Name: N/A    Number of Children: N/A  . Years of Education: N/A   Occupational History  . Not on file.   Social History Main Topics  . Smoking status: Never Smoker   . Smokeless tobacco: Never Used  . Alcohol  Use: 0.0 oz/week    0 Not specified per week  . Drug Use: No  . Sexual Activity: Not on file   Other Topics Concern  . Not on file   Social History Narrative     Constitutional: Pt reports fever and malaise. Denies fatigue, headache or abrupt weight changes.  HEENT: Pt reports sore throat. Denies eye pain, eye redness, ear pain, ringing in the ears, wax buildup, runny nose or nasal congestion. Respiratory: Denies difficulty breathing, shortness of breath, cough or sputum production.    No other specific complaints in a complete review of systems (except as listed in HPI above).  Objective:   Physical Exam  BP 110/72 mmHg  Pulse 71  Temp(Src) 97.9 F (36.6 C) (Oral)  Wt 228 lb (103.42 kg)  SpO2 99% Wt Readings from Last 3 Encounters:  04/04/14 228 lb (103.42 kg)    03/10/14 221 lb 8 oz (100.472 kg)  12/09/13 221 lb 8 oz (100.472 kg)    General: Appears her stated age, in NAD. Skin: Warm, dry and intact. No rashes, lesions or ulcerations noted. HEENT: Head: normal shape and size; Eyes: sclera white, no icterus, conjunctiva pink; Ears: Tm's pink but intact, normal light reflex, + effusion on the left; Nose: mucosa pink and moist, septum midline; Throat/Mouth: Teeth present, mucosa erythematous and moist, white patchy exudate noted on bilateral tonsillar pillars, no lesions or ulcerations noted.  Neck: No adenopathy noted.  Cardiovascular: Normal rate and rhythm. S1,S2 noted.  No murmur, rubs or gallops noted. Pulmonary/Chest: Normal effort and positive vesicular breath sounds. No respiratory distress. No wheezes, rales or ronchi noted.   BMET    Component Value Date/Time   NA 140 12/20/2013 0801   NA 137 04/19/2010   K 3.5 12/20/2013 0801   CL 102 12/20/2013 0801   CO2 30 12/20/2013 0801   GLUCOSE 100* 12/20/2013 0801   BUN 18 12/20/2013 0801   BUN 17 04/19/2010   CREATININE 0.7 12/20/2013 0801   CREATININE 0.7 04/19/2010   CALCIUM 9.1 12/20/2013 0801    Lipid Panel     Component Value Date/Time   CHOL 181 12/20/2013 0801   TRIG 191.0* 12/20/2013 0801   HDL 43.10 12/20/2013 0801   CHOLHDL 4 12/20/2013 0801   VLDL 38.2 12/20/2013 0801   LDLCALC 100* 12/20/2013 0801    CBC    Component Value Date/Time   WBC 7.0 12/20/2013 0801   RBC 4.75 12/20/2013 0801   HGB 14.1 12/20/2013 0801   HCT 42.3 12/20/2013 0801   PLT 219.0 12/20/2013 0801   MCV 89.1 12/20/2013 0801   MCHC 33.3 12/20/2013 0801   RDW 14.0 12/20/2013 0801   LYMPHSABS 1.9 12/20/2013 0801   MONOABS 0.4 12/20/2013 0801   EOSABS 0.1 12/20/2013 0801   BASOSABS 0.0 12/20/2013 0801    Hgb A1C No results found for: HGBA1C       Assessment & Plan:   Sore throat:  I did not see any evidence of a peritonsillar abscess RST: negative Given duration of symptoms, will  treat with Amoxil 875 mg BID x 10 days Will check mono spot as well Ibuprofen for pain Work note provided  RTC as needed or if symptoms persist or worsen

## 2014-04-05 ENCOUNTER — Ambulatory Visit: Payer: 59

## 2014-04-12 ENCOUNTER — Ambulatory Visit (INDEPENDENT_AMBULATORY_CARE_PROVIDER_SITE_OTHER): Payer: 59 | Admitting: *Deleted

## 2014-04-12 DIAGNOSIS — E538 Deficiency of other specified B group vitamins: Secondary | ICD-10-CM

## 2014-04-12 MED ORDER — CYANOCOBALAMIN 1000 MCG/ML IJ SOLN
1000.0000 ug | Freq: Once | INTRAMUSCULAR | Status: AC
  Administered 2014-04-12: 1000 ug via INTRAMUSCULAR

## 2014-04-20 ENCOUNTER — Encounter: Payer: Self-pay | Admitting: Nurse Practitioner

## 2014-04-20 ENCOUNTER — Ambulatory Visit (INDEPENDENT_AMBULATORY_CARE_PROVIDER_SITE_OTHER): Payer: 59 | Admitting: Nurse Practitioner

## 2014-04-20 VITALS — BP 108/64 | HR 89 | Temp 98.2°F | Resp 12 | Ht 67.5 in | Wt 223.8 lb

## 2014-04-20 DIAGNOSIS — M10072 Idiopathic gout, left ankle and foot: Secondary | ICD-10-CM

## 2014-04-20 DIAGNOSIS — M109 Gout, unspecified: Secondary | ICD-10-CM

## 2014-04-20 MED ORDER — METHYLPREDNISOLONE (PAK) 4 MG PO TABS: ORAL_TABLET | ORAL | Status: DC

## 2014-04-20 NOTE — Progress Notes (Signed)
Subjective:    Patient ID: Lori Le, female    DOB: 03/15/1966, 49 y.o.   MRN: [id number]  HPI  Lori Le is a 49 yo female with a CC of left foot gout flare up.   1) Pt has had a swollen left great toe with redness, tenderness, and pain x 1 day. Worse today more so than yesterday.   Pt is on allopurinol daily and is still having flare ups. She has a history of gastric bypass and can not use NSAIDs. The pt states she has not previously been made aware of foods that exacerbate gout.    Review of Systems  Constitutional: Negative for fever, chills, diaphoresis and fatigue.  Respiratory: Negative for chest tightness, shortness of breath and wheezing.   Cardiovascular: Negative for chest pain, palpitations and leg swelling.  Gastrointestinal: Negative for nausea, vomiting and diarrhea.  Musculoskeletal: Positive for joint swelling and gait problem. Negative for myalgias, back pain and arthralgias.       Left great toe is very swollen and painful   Skin: Positive for color change. Negative for rash.       Erythema of great left toe   Neurological: Negative for dizziness, weakness, numbness and headaches.  Psychiatric/Behavioral: The patient is not nervous/anxious.    Past Medical History  Diagnosis Date  . Hypertension   . Hyperlipidemia   . Allergy   . GERD (gastroesophageal reflux disease)   . Arthritis   . Melanoma in situ     left shoulder  . Depression   . History of eating disorder   . Migraine   . Hypothyroidism   . PCOS (polycystic ovarian syndrome)     History   Social History  . Marital Status: Married    Spouse Name: N/A    Number of Children: N/A  . Years of Education: N/A   Occupational History  . Not on file.   Social History Main Topics  . Smoking status: Never Smoker   . Smokeless tobacco: Never Used  . Alcohol Use: 0.0 oz/week    0 Not specified per week  . Drug Use: No  . Sexual Activity: Not on file   Other Topics Concern  . Not on  file   Social History Narrative    Past Surgical History  Procedure Laterality Date  . Cholecystectomy  2003  . Foot surgery  1994  . Gastric bypass  2011  . Laparoscopic gastric banding  2010    Family History  Problem Relation Age of Onset  . Arthritis Mother   . Hyperlipidemia Mother   . Hypertension Mother   . Diabetes Mother   . Arthritis Father   . Hyperlipidemia Father   . Hypertension Father   . Mental illness Father   . Diabetes Father   . Arthritis Maternal Grandmother   . Cancer Maternal Grandmother     breast cancer  . Hyperlipidemia Maternal Grandmother   . Hypertension Maternal Grandmother   . Arthritis Maternal Grandfather   . Hyperlipidemia Maternal Grandfather   . Hypertension Maternal Grandfather   . Heart disease Maternal Grandfather     heart attack  . Breast cancer      maternal and paternal grandmother    Allergies  Allergen Reactions  . Influenza Vaccines Hives  . Neosporin + Pain Relief Max St [Neomy-Bacit-Polymyx-Pramoxine] Rash    Current Outpatient Prescriptions on File Prior to Visit  Medication Sig Dispense Refill  . allopurinol (ZYLOPRIM) 300 MG tablet Take 1  tablet (300 mg total) by mouth daily. 30 tablet 2  . buPROPion (WELLBUTRIN XL) 150 MG 24 hr tablet Take 1 tablet (150 mg total) by mouth daily. 30 tablet 2  . furosemide (LASIX) 40 MG tablet Take 1 tablet (40 mg total) by mouth daily. 90 tablet 1  . levothyroxine (SYNTHROID) 137 MCG tablet Take 1 tablet (137 mcg total) by mouth daily before breakfast. 30 tablet 11  . omeprazole (PRILOSEC) 20 MG capsule Take 1 capsule (20 mg total) by mouth daily. 90 capsule 1  . potassium chloride (K-DUR) 10 MEQ tablet Take 1 tablet (10 mEq total) by mouth 2 (two) times daily. 180 tablet 1  . triamterene-hydrochlorothiazide (MAXZIDE-25) 37.5-25 MG per tablet Take 1 tablet by mouth daily. 90 tablet 1  . predniSONE (DELTASONE) 10 MG tablet Take 6 tablets x 1 day and then decrease by one tablet per  day until down to zero mg. (Patient not taking: Reported on 04/20/2014) 21 tablet 0   No current facility-administered medications on file prior to visit.      Objective:   Physical Exam  Constitutional: She is oriented to person, place, and time. She appears well-developed and well-nourished. No distress.  BP 108/64 mmHg  Pulse 89  Temp(Src) 98.2 F (36.8 C) (Oral)  Resp 12  Ht 5' 7.5" (1.715 m)  Wt 223 lb 12.8 oz (101.515 kg)  BMI 34.51 kg/m2  SpO2 97%  LMP  (Approximate)   HENT:  Head: Normocephalic and atraumatic.  Right Ear: External ear normal.  Left Ear: External ear normal.  Cardiovascular: Normal rate, regular rhythm, normal heart sounds and intact distal pulses.  Exam reveals no gallop and no friction rub.   No murmur heard. Pulmonary/Chest: Effort normal and breath sounds normal. No respiratory distress. She has no wheezes. She has no rales. She exhibits no tenderness.  Musculoskeletal: She exhibits edema and tenderness.       Left foot: There is decreased range of motion, tenderness, bony tenderness and swelling. There is no crepitus, no deformity and no laceration.       Feet:  Neurological: She is alert and oriented to person, place, and time. No cranial nerve deficit. She exhibits normal muscle tone. Coordination normal.  Skin: Skin is warm and dry. No rash noted. She is not diaphoretic.  Psychiatric: She has a normal mood and affect. Her behavior is normal. Judgment and thought content normal.      Assessment & Plan:

## 2014-04-20 NOTE — Progress Notes (Signed)
Pre visit review using our clinic review tool, if applicable. No additional management support is needed unless otherwise documented below in the visit note. 

## 2014-04-20 NOTE — Patient Instructions (Signed)

## 2014-04-23 NOTE — Assessment & Plan Note (Signed)
Worsening. Pt having acute flare up. Continue allopurinol. Add a prednisone taper to help with immediate pain/swelling. Pt given handout on gout and foods to avoid. If pt continues to have flare ups could switch to another urate-lowering medication. NSAIDs are contraindicated due to s/p gastric bypass. Unsure if colchicine is contraindicated, but pt has had good luck in past with prednisone taper. FU prn worsening/failure to improve.

## 2014-05-14 ENCOUNTER — Other Ambulatory Visit: Payer: Self-pay | Admitting: Internal Medicine

## 2014-05-16 ENCOUNTER — Telehealth: Payer: Self-pay

## 2014-05-16 NOTE — Telephone Encounter (Signed)
Please advise 

## 2014-05-16 NOTE — Telephone Encounter (Signed)
I can see her tomorrow at 12:00 for this problem.

## 2014-05-16 NOTE — Telephone Encounter (Signed)
The patient called stating she was having pain due to gout and is hoping a medication can be called in.  Callback - [employer] in [place]

## 2014-05-17 ENCOUNTER — Encounter: Payer: Self-pay | Admitting: Nurse Practitioner

## 2014-05-17 ENCOUNTER — Ambulatory Visit (INDEPENDENT_AMBULATORY_CARE_PROVIDER_SITE_OTHER): Payer: 59 | Admitting: Nurse Practitioner

## 2014-05-17 ENCOUNTER — Ambulatory Visit (INDEPENDENT_AMBULATORY_CARE_PROVIDER_SITE_OTHER): Payer: 59

## 2014-05-17 VITALS — BP 128/78 | HR 74 | Temp 98.3°F | Resp 14 | Ht 67.0 in | Wt 231.4 lb

## 2014-05-17 DIAGNOSIS — E538 Deficiency of other specified B group vitamins: Secondary | ICD-10-CM

## 2014-05-17 DIAGNOSIS — Z79899 Other long term (current) drug therapy: Secondary | ICD-10-CM

## 2014-05-17 DIAGNOSIS — M109 Gout, unspecified: Secondary | ICD-10-CM

## 2014-05-17 DIAGNOSIS — M10072 Idiopathic gout, left ankle and foot: Secondary | ICD-10-CM

## 2014-05-17 LAB — CBC WITH DIFFERENTIAL/PLATELET
BASOS PCT: 0.4 % (ref 0.0–3.0)
Basophils Absolute: 0 10*3/uL (ref 0.0–0.1)
EOS PCT: 1.6 % (ref 0.0–5.0)
Eosinophils Absolute: 0.1 10*3/uL (ref 0.0–0.7)
HCT: 40.9 % (ref 36.0–46.0)
Hemoglobin: 13.6 g/dL (ref 12.0–15.0)
LYMPHS ABS: 1.7 10*3/uL (ref 0.7–4.0)
Lymphocytes Relative: 19 % (ref 12.0–46.0)
MCHC: 33.3 g/dL (ref 30.0–36.0)
MCV: 89.3 fl (ref 78.0–100.0)
MONO ABS: 0.6 10*3/uL (ref 0.1–1.0)
MONOS PCT: 6.1 % (ref 3.0–12.0)
NEUTROS ABS: 6.7 10*3/uL (ref 1.4–7.7)
NEUTROS PCT: 72.9 % (ref 43.0–77.0)
PLATELETS: 265 10*3/uL (ref 150.0–400.0)
RBC: 4.58 Mil/uL (ref 3.87–5.11)
RDW: 13.4 % (ref 11.5–15.5)
WBC: 9.2 10*3/uL (ref 4.0–10.5)

## 2014-05-17 LAB — COMPREHENSIVE METABOLIC PANEL
ALBUMIN: 3.9 g/dL (ref 3.5–5.2)
ALT: 14 U/L (ref 0–35)
AST: 9 U/L (ref 0–37)
Alkaline Phosphatase: 53 U/L (ref 39–117)
BUN: 14 mg/dL (ref 6–23)
CALCIUM: 9.2 mg/dL (ref 8.4–10.5)
CO2: 33 mEq/L — ABNORMAL HIGH (ref 19–32)
Chloride: 99 mEq/L (ref 96–112)
Creatinine, Ser: 0.72 mg/dL (ref 0.40–1.20)
GFR: 91.76 mL/min (ref >60.00)
Glucose, Bld: 81 mg/dL (ref 70–99)
POTASSIUM: 3.8 meq/L (ref 3.5–5.1)
SODIUM: 138 meq/L (ref 135–145)
Total Bilirubin: 0.4 mg/dL (ref 0.2–1.2)
Total Protein: 6.5 g/dL (ref 6.0–8.3)

## 2014-05-17 MED ORDER — CYANOCOBALAMIN 1000 MCG/ML IJ SOLN
1000.0000 ug | Freq: Once | INTRAMUSCULAR | Status: AC
  Administered 2014-05-17: 1000 ug via INTRAMUSCULAR

## 2014-05-17 MED ORDER — COLCHICINE 0.6 MG PO TABS: 0.6000 mg | ORAL_TABLET | Freq: Two times a day (BID) | ORAL | Status: DC

## 2014-05-17 NOTE — Progress Notes (Signed)
Pre visit review using our clinic review tool, if applicable. No additional management support is needed unless otherwise documented below in the visit note. 

## 2014-05-17 NOTE — Assessment & Plan Note (Signed)
Worsening. After speaking with Dr. Nicki Reaper we will obtain CMET and CBC w/ diff. Also, trying Colchicine 0.6 mg twice daily (1 tab in am and 1 tab in pm) x 15 days. Asked pt to call if she gets diarrhea or other side effects. Pt still taking HCTZ and lasix daily for fluid retention. She is to follow up in April with Dr. Nicki Reaper and we may change meds after CMET and CBC w/ diff results come back. Will follow.

## 2014-05-17 NOTE — Progress Notes (Signed)
Subjective:    Patient ID: Lori Le, female    DOB: Feb 22, 1966, 49 y.o.   MRN: [id number]  HPI  Lori Le is a 49 yo female with a CC of gout of left big toe, fluid retention and mouth ulcers x 3 days. She will also receive her B12 injection since she is here (originally scheduled for 4 pm Nurse visit).   1) Gout- big flare of left toe, spasming feeling, painful, sensitive, swelling. Taking allopurinol 300 mg tablet once daily  Prednisone did not help well last time.  Not sleeping due to pain Drinks occasional beer this weekend, had clams and scallops 1 week or more ago, and no asparagus recently.   2) Fluid retention and mouth ulcers- gained several lbs in just a week   On Lasix and HCTZ- urinated mostly in the morning and then less during the day. Not eating anything different.  Sore spots on tongue and one on the left inner cheek side- all popped up on Saturday.    Wt Readings from Last 3 Encounters:  05/17/14 231 lb 6.4 oz (104.962 kg)  04/20/14 223 lb 12.8 oz (101.515 kg)  04/04/14 228 lb (103.42 kg)     Review of Systems  Constitutional: Positive for unexpected weight change. Negative for fever, chills, diaphoresis and fatigue.       Fluid retention  HENT: Positive for mouth sores.   Respiratory: Negative for chest tightness, shortness of breath and wheezing.   Cardiovascular: Negative for chest pain, palpitations and leg swelling.  Gastrointestinal: Negative for nausea, vomiting and diarrhea.  Musculoskeletal: Positive for arthralgias.       Left distal metatarsal medial side is very red, left foot swollen.   Skin: Negative for rash.  Neurological: Negative for dizziness, weakness, numbness and headaches.  Psychiatric/Behavioral: The patient is not nervous/anxious.    Past Medical History  Diagnosis Date  . Hypertension   . Hyperlipidemia   . Allergy   . GERD (gastroesophageal reflux disease)   . Arthritis   . Melanoma in situ     left shoulder  .  Depression   . History of eating disorder   . Migraine   . Hypothyroidism   . PCOS (polycystic ovarian syndrome)     History   Social History  . Marital Status: Married    Spouse Name: N/A  . Number of Children: N/A  . Years of Education: N/A   Occupational History  . Not on file.   Social History Main Topics  . Smoking status: Never Smoker   . Smokeless tobacco: Never Used  . Alcohol Use: 0.0 oz/week    0 Standard drinks or equivalent per week  . Drug Use: No  . Sexual Activity: Not on file   Other Topics Concern  . Not on file   Social History Narrative    Past Surgical History  Procedure Laterality Date  . Cholecystectomy  2003  . Foot surgery  1994  . Gastric bypass  2011  . Laparoscopic gastric banding  2010    Family History  Problem Relation Age of Onset  . Arthritis Mother   . Hyperlipidemia Mother   . Hypertension Mother   . Diabetes Mother   . Arthritis Father   . Hyperlipidemia Father   . Hypertension Father   . Mental illness Father   . Diabetes Father   . Arthritis Maternal Grandmother   . Cancer Maternal Grandmother     breast cancer  . Hyperlipidemia Maternal Grandmother   .  Hypertension Maternal Grandmother   . Arthritis Maternal Grandfather   . Hyperlipidemia Maternal Grandfather   . Hypertension Maternal Grandfather   . Heart disease Maternal Grandfather     heart attack  . Breast cancer      maternal and paternal grandmother    Allergies  Allergen Reactions  . Influenza Vaccines Hives  . Neosporin + Pain Relief Max St [Neomy-Bacit-Polymyx-Pramoxine] Rash    Current Outpatient Prescriptions on File Prior to Visit  Medication Sig Dispense Refill  . allopurinol (ZYLOPRIM) 300 MG tablet Take 1 tablet (300 mg total) by mouth daily. 30 tablet 2  . buPROPion (WELLBUTRIN XL) 150 MG 24 hr tablet TAKE ONE TABLET BY MOUTH ONE TIME DAILY  30 tablet 1  . furosemide (LASIX) 40 MG tablet Take 1 tablet (40 mg total) by mouth daily. 90  tablet 1  . levothyroxine (SYNTHROID) 137 MCG tablet Take 1 tablet (137 mcg total) by mouth daily before breakfast. 30 tablet 11  . methylPREDNIsolone (MEDROL DOSPACK) 4 MG tablet follow package directions 21 tablet 0  . omeprazole (PRILOSEC) 20 MG capsule Take 1 capsule (20 mg total) by mouth daily. 90 capsule 1  . potassium chloride (K-DUR) 10 MEQ tablet Take 1 tablet (10 mEq total) by mouth 2 (two) times daily. 180 tablet 1  . predniSONE (DELTASONE) 10 MG tablet Take 6 tablets x 1 day and then decrease by one tablet per day until down to zero mg. 21 tablet 0  . triamterene-hydrochlorothiazide (MAXZIDE-25) 37.5-25 MG per tablet Take 1 tablet by mouth daily. 90 tablet 1   No current facility-administered medications on file prior to visit.      Objective:   Physical Exam  Constitutional: She is oriented to person, place, and time. She appears well-developed and well-nourished. No distress.  BP 128/78 mmHg  Pulse 74  Temp(Src) 98.3 F (36.8 C) (Oral)  Resp 14  Ht 5\' 7"  (1.702 m)  Wt 231 lb 6.4 oz (104.962 kg)  BMI 36.23 kg/m2  SpO2 98%  LMP  (Approximate)   HENT:  Head: Normocephalic and atraumatic.  Right Ear: External ear normal.  Left Ear: External ear normal.  Eyes: Right eye exhibits no discharge. Left eye exhibits no discharge. No scleral icterus.  Cardiovascular: Normal rate, regular rhythm, normal heart sounds and intact distal pulses.  Exam reveals no gallop and no friction rub.   No murmur heard. Pulmonary/Chest: Effort normal and breath sounds normal. No respiratory distress. She has no wheezes. She has no rales. She exhibits no tenderness.  Musculoskeletal: Normal range of motion. She exhibits edema and tenderness.  Left distal metatarsal swollen, red, tender, other toes and left foot are swollen also.   Neurological: She is alert and oriented to person, place, and time. No cranial nerve deficit. She exhibits normal muscle tone. Coordination normal.  Skin: Skin is warm  and dry. No rash noted. She is not diaphoretic. There is erythema.  Of foot  Psychiatric: She has a normal mood and affect. Her behavior is normal. Judgment and thought content normal.       Assessment & Plan:

## 2014-05-17 NOTE — Patient Instructions (Addendum)
Try the Colchicine 1 tablet in the morning and 1 tablet in the evening for 15 days.   Call us if you have any problems with it.   Visit the lab before leaving today.   Follow up with Dr. Nicki Reaper in April.

## 2014-05-18 ENCOUNTER — Other Ambulatory Visit: Payer: Self-pay | Admitting: Nurse Practitioner

## 2014-05-18 ENCOUNTER — Encounter: Payer: Self-pay | Admitting: Nurse Practitioner

## 2014-05-18 MED ORDER — MAGIC MOUTHWASH W/LIDOCAINE: 5.0000 mL | Freq: Three times a day (TID) | ORAL | Status: DC

## 2014-06-04 ENCOUNTER — Other Ambulatory Visit: Payer: Self-pay | Admitting: Internal Medicine

## 2014-06-09 ENCOUNTER — Ambulatory Visit (INDEPENDENT_AMBULATORY_CARE_PROVIDER_SITE_OTHER): Payer: 59 | Admitting: Nurse Practitioner

## 2014-06-09 ENCOUNTER — Encounter: Payer: Self-pay | Admitting: Nurse Practitioner

## 2014-06-09 VITALS — BP 130/72 | HR 97 | Temp 98.3°F | Resp 16 | Ht 67.0 in | Wt 230.0 lb

## 2014-06-09 DIAGNOSIS — M10072 Idiopathic gout, left ankle and foot: Secondary | ICD-10-CM

## 2014-06-09 DIAGNOSIS — M109 Gout, unspecified: Secondary | ICD-10-CM

## 2014-06-09 MED ORDER — PREDNISONE 10 MG PO TABS: ORAL_TABLET | ORAL | Status: DC

## 2014-06-09 NOTE — Patient Instructions (Signed)
Try the prednisone. We have put in a urgent referral to Dr. Jefm Bryant for further evaluation and treatment.

## 2014-06-09 NOTE — Progress Notes (Signed)
   Subjective:    Patient ID: Lori Le, female    DOB: 1965/07/18, 49 y.o.   MRN: [id number]  HPI  Lori Le is a 49 yo female with a CC of gout flare.   1) Tried changing her diet to being Vegetarian until dinner time.   Vitamin C and cherry juice- black cherry concentrate   Started this a few months again  No changes, Colchicine was slow to help.   Review of Systems  Constitutional: Negative for fever, chills, diaphoresis and fatigue.  Respiratory: Negative for chest tightness, shortness of breath and wheezing.   Cardiovascular: Negative for chest pain, palpitations and leg swelling.  Gastrointestinal: Negative for nausea, vomiting and diarrhea.  Musculoskeletal: Positive for arthralgias and gait problem.       Left foot gout, walking with crutch today  Skin: Negative for rash.  Neurological: Negative for dizziness, weakness, numbness and headaches.  Psychiatric/Behavioral: The patient is not nervous/anxious.        Objective:   Physical Exam  Constitutional: She is oriented to person, place, and time. She appears well-developed and well-nourished. No distress.  BP 130/72 mmHg  Pulse 97  Temp(Src) 98.3 F (36.8 C) (Oral)  Resp 16  Ht 5\' 7"  (1.702 m)  Wt 230 lb (104.327 kg)  BMI 36.01 kg/m2  SpO2 99%  LMP 05/19/2014   HENT:  Head: Normocephalic and atraumatic.  Right Ear: External ear normal.  Left Ear: External ear normal.  Cardiovascular: Normal rate, regular rhythm, normal heart sounds and intact distal pulses.  Exam reveals no gallop and no friction rub.   No murmur heard. Pulmonary/Chest: Effort normal and breath sounds normal. No respiratory distress. She has no wheezes. She has no rales. She exhibits no tenderness.  Musculoskeletal: She exhibits edema and tenderness.  Left foot erythema and pain Ambulating with a crutch  Neurological: She is alert and oriented to person, place, and time. No cranial nerve deficit. She exhibits normal muscle tone.  Coordination normal.  Skin: Skin is warm and dry. No rash noted. She is not diaphoretic.  Psychiatric: She has a normal mood and affect. Her behavior is normal. Judgment and thought content normal.      Assessment & Plan:

## 2014-06-09 NOTE — Assessment & Plan Note (Signed)
Prednisone taper sent to pharmacy and urgent referral to Dr. Jefm Bryant. Will follow.

## 2014-06-09 NOTE — Progress Notes (Signed)
Pre visit review using our clinic review tool, if applicable. No additional management support is needed unless otherwise documented below in the visit note. 

## 2014-06-10 ENCOUNTER — Encounter: Payer: Self-pay | Admitting: Nurse Practitioner

## 2014-06-12 ENCOUNTER — Other Ambulatory Visit: Payer: Self-pay | Admitting: Internal Medicine

## 2014-06-13 ENCOUNTER — Other Ambulatory Visit: Payer: Self-pay | Admitting: Nurse Practitioner

## 2014-06-13 DIAGNOSIS — M1A0720 Idiopathic chronic gout, left ankle and foot, without tophus (tophi): Secondary | ICD-10-CM

## 2014-06-13 DIAGNOSIS — M109 Gout, unspecified: Secondary | ICD-10-CM

## 2014-06-13 MED ORDER — ALLOPURINOL 100 MG PO TABS: 100.0000 mg | ORAL_TABLET | Freq: Every day | ORAL | Status: DC

## 2014-06-16 ENCOUNTER — Ambulatory Visit (INDEPENDENT_AMBULATORY_CARE_PROVIDER_SITE_OTHER): Payer: 59 | Admitting: *Deleted

## 2014-06-16 ENCOUNTER — Other Ambulatory Visit (INDEPENDENT_AMBULATORY_CARE_PROVIDER_SITE_OTHER): Payer: 59

## 2014-06-16 DIAGNOSIS — E538 Deficiency of other specified B group vitamins: Secondary | ICD-10-CM

## 2014-06-16 DIAGNOSIS — M1A0720 Idiopathic chronic gout, left ankle and foot, without tophus (tophi): Secondary | ICD-10-CM

## 2014-06-16 MED ORDER — CYANOCOBALAMIN 1000 MCG/ML IJ SOLN
1000.0000 ug | Freq: Once | INTRAMUSCULAR | Status: AC
  Administered 2014-06-16: 1000 ug via INTRAMUSCULAR

## 2014-06-17 LAB — URIC ACID: URIC ACID, SERUM: 5.1 mg/dL (ref 2.4–7.0)

## 2014-06-21 ENCOUNTER — Encounter: Payer: Self-pay | Admitting: Nurse Practitioner

## 2014-06-23 ENCOUNTER — Other Ambulatory Visit: Payer: Self-pay | Admitting: Nurse Practitioner

## 2014-06-23 ENCOUNTER — Encounter: Payer: Self-pay | Admitting: Nurse Practitioner

## 2014-06-23 MED ORDER — PREDNISONE 10 MG PO TABS: ORAL_TABLET | ORAL | Status: DC

## 2014-07-14 ENCOUNTER — Ambulatory Visit (INDEPENDENT_AMBULATORY_CARE_PROVIDER_SITE_OTHER): Payer: 59 | Admitting: Internal Medicine

## 2014-07-14 ENCOUNTER — Encounter: Payer: Self-pay | Admitting: Internal Medicine

## 2014-07-14 VITALS — BP 112/72 | HR 73 | Temp 97.8°F | Ht 67.0 in | Wt 231.5 lb

## 2014-07-14 DIAGNOSIS — K219 Gastro-esophageal reflux disease without esophagitis: Secondary | ICD-10-CM

## 2014-07-14 DIAGNOSIS — E039 Hypothyroidism, unspecified: Secondary | ICD-10-CM

## 2014-07-14 DIAGNOSIS — M109 Gout, unspecified: Secondary | ICD-10-CM

## 2014-07-14 DIAGNOSIS — I10 Essential (primary) hypertension: Secondary | ICD-10-CM

## 2014-07-14 DIAGNOSIS — F329 Major depressive disorder, single episode, unspecified: Secondary | ICD-10-CM | POA: Diagnosis not present

## 2014-07-14 DIAGNOSIS — R609 Edema, unspecified: Secondary | ICD-10-CM

## 2014-07-14 DIAGNOSIS — M10072 Idiopathic gout, left ankle and foot: Secondary | ICD-10-CM

## 2014-07-14 DIAGNOSIS — F32A Depression, unspecified: Secondary | ICD-10-CM

## 2014-07-14 DIAGNOSIS — Z9884 Bariatric surgery status: Secondary | ICD-10-CM

## 2014-07-14 NOTE — Progress Notes (Signed)
Pre visit review using our clinic review tool, if applicable. No additional management support is needed unless otherwise documented below in the visit note. 

## 2014-07-18 ENCOUNTER — Encounter: Payer: Self-pay | Admitting: Internal Medicine

## 2014-07-18 NOTE — Assessment & Plan Note (Signed)
Persistent intermittent flares.  Due to f/u in [place] as outlined.

## 2014-07-18 NOTE — Assessment & Plan Note (Signed)
On thyroid replacement.  Follow tsh.  

## 2014-07-18 NOTE — Progress Notes (Signed)
Patient ID: Lori Le, female   DOB: 05-13-1965, 49 y.o.   MRN: [id number]   Subjective:    Patient ID: Lori Le, female    DOB: Jul 13, 1965, 49 y.o.   MRN: [id number]  HPI  Patient here for a scheduled follow up.  Reports decreased energy.  Does not feel rested when awakens.  Discussed a f/u sleep study.  Had some home testing recently.  Only mild changes.  Declines any further testing.  Feels retaining fluid.  We tried getting her off HCTZ.  Had increased fluid retention.  Unable to stop.  Recent problems with increased gout flares.  Better now.  She has an appoint in [place] with "gout clinic".  Breathing stable.  Trying to watch her diet.     Past Medical History  Diagnosis Date  . Hypertension   . Hyperlipidemia   . Allergy   . GERD (gastroesophageal reflux disease)   . Arthritis   . Melanoma in situ     left shoulder  . Depression   . History of eating disorder   . Migraine   . Hypothyroidism   . PCOS (polycystic ovarian syndrome)     Current Outpatient Prescriptions on File Prior to Visit  Medication Sig Dispense Refill  . allopurinol (ZYLOPRIM) 100 MG tablet Take 1 tablet (100 mg total) by mouth daily. 30 tablet 0  . allopurinol (ZYLOPRIM) 300 MG tablet TAKE ONE TABLET BY MOUTH ONE TIME DAILY  30 tablet 5  . Alum & Mag Hydroxide-Simeth (MAGIC MOUTHWASH W/LIDOCAINE) SOLN Take 5 mLs by mouth 3 (three) times daily. 115 mL 0  . buPROPion (WELLBUTRIN XL) 150 MG 24 hr tablet TAKE ONE TABLET BY MOUTH ONE TIME DAILY  30 tablet 1  . colchicine 0.6 MG tablet Take 1 tablet (0.6 mg total) by mouth 2 (two) times daily. 30 tablet 0  . furosemide (LASIX) 40 MG tablet TAKE ONE TABLET BY MOUTH ONE TIME DAILY  90 tablet 2  . levothyroxine (SYNTHROID) 137 MCG tablet Take 1 tablet (137 mcg total) by mouth daily before breakfast. 30 tablet 11  . omeprazole (PRILOSEC) 20 MG capsule Take 1 capsule (20 mg total) by mouth daily. 90 capsule 1  . potassium chloride (K-DUR) 10 MEQ tablet Take  1 tablet (10 mEq total) by mouth 2 (two) times daily. 180 tablet 1  . predniSONE (DELTASONE) 10 MG tablet Take 6 tablets x 1 day and then decrease by one tablet per day until down to zero mg. 21 tablet 0  . triamterene-hydrochlorothiazide (MAXZIDE-25) 37.5-25 MG per tablet Take 1 tablet by mouth daily. 90 tablet 1   No current facility-administered medications on file prior to visit.    Review of Systems  Constitutional: Negative for appetite change and unexpected weight change.  HENT: Negative for congestion and sinus pressure.   Respiratory: Negative for cough, chest tightness and shortness of breath.   Cardiovascular: Positive for leg swelling (reports increased swelling. ). Negative for chest pain and palpitations.  Gastrointestinal: Negative for nausea, vomiting, abdominal pain and diarrhea.  Genitourinary: Negative for dysuria and difficulty urinating.  Neurological: Negative for dizziness, light-headedness and headaches.  Psychiatric/Behavioral: Negative for dysphoric mood and agitation.       Objective:     Blood pressure recheck:  114/72  Physical Exam  Constitutional: She appears well-developed and well-nourished. No distress.  HENT:  Nose: Nose normal.  Mouth/Throat: Oropharynx is clear and moist.  Neck: Neck supple. No thyromegaly present.  Cardiovascular: Normal rate and regular  rhythm.   Pulmonary/Chest: Breath sounds normal. No respiratory distress. She has no wheezes.  Abdominal: Soft. Bowel sounds are normal. There is no tenderness.  Musculoskeletal: She exhibits no edema or tenderness.  Lymphadenopathy:    She has no cervical adenopathy.  Skin: No rash noted. No erythema.    BP 112/72 mmHg  Pulse 73  Temp(Src) 97.8 F (36.6 C) (Oral)  Ht 5\' 7"  (1.702 m)  Wt 231 lb 8 oz (105.008 kg)  BMI 36.25 kg/m2  SpO2 97% Wt Readings from Last 3 Encounters:  07/14/14 231 lb 8 oz (105.008 kg)  06/09/14 230 lb (104.327 kg)  05/17/14 231 lb 6.4 oz (104.962 kg)      Lab Results  Component Value Date   WBC 9.2 05/17/2014   HGB 13.6 05/17/2014   HCT 40.9 05/17/2014   PLT 265.0 05/17/2014   GLUCOSE 81 05/17/2014   CHOL 181 12/20/2013   TRIG 191.0* 12/20/2013   HDL 43.10 12/20/2013   LDLDIRECT 95.1 03/15/2013   LDLCALC 100* 12/20/2013   ALT 14 05/17/2014   AST 9 05/17/2014   NA 138 05/17/2014   K 3.8 05/17/2014   CL 99 05/17/2014   CREATININE 0.72 05/17/2014   BUN 14 05/17/2014   CO2 33* 05/17/2014   TSH 1.23 03/10/2014       Assessment & Plan:   Problem List Items Addressed This Visit    Depression    On wellbutrin.  Overall appears to be stable.  Still with the increased stress.  Follow.       Edema    Unable to stop the hctz.  Increased fluid retention when stopped.        Essential hypertension, benign - Primary    Blood pressure as outlined.  Doing well.  Follow pressures.  Follow metabolic panel.       Relevant Orders   Lipid panel   Comprehensive metabolic panel   GERD (gastroesophageal reflux disease)    Controlled on prilosec.  Follow.        Gout    Persistent intermittent flares.  Due to f/u in [place] as outlined.        H/O gastric bypass    Stable.  Follow.        Hypothyroidism    On thyroid replacement.  Follow tsh.           Einar Pheasant, MD

## 2014-07-18 NOTE — Assessment & Plan Note (Signed)
Blood pressure as outlined.  Doing well.  Follow pressures.  Follow metabolic panel.  

## 2014-07-18 NOTE — Assessment & Plan Note (Signed)
Unable to stop the hctz.  Increased fluid retention when stopped.

## 2014-07-18 NOTE — Assessment & Plan Note (Signed)
Controlled on prilosec.  Follow.  

## 2014-07-18 NOTE — Assessment & Plan Note (Signed)
Stable.  Follow.   

## 2014-07-18 NOTE — Assessment & Plan Note (Signed)
On wellbutrin.  Overall appears to be stable.  Still with the increased stress.  Follow.

## 2014-07-19 ENCOUNTER — Ambulatory Visit (INDEPENDENT_AMBULATORY_CARE_PROVIDER_SITE_OTHER): Payer: 59 | Admitting: *Deleted

## 2014-07-19 DIAGNOSIS — E538 Deficiency of other specified B group vitamins: Secondary | ICD-10-CM

## 2014-07-19 MED ORDER — CYANOCOBALAMIN 1000 MCG/ML IJ SOLN
1000.0000 ug | Freq: Once | INTRAMUSCULAR | Status: AC
  Administered 2014-07-19: 1000 ug via INTRAMUSCULAR

## 2014-07-25 ENCOUNTER — Encounter: Payer: Self-pay | Admitting: Nurse Practitioner

## 2014-07-25 ENCOUNTER — Ambulatory Visit (INDEPENDENT_AMBULATORY_CARE_PROVIDER_SITE_OTHER): Payer: 59 | Admitting: Nurse Practitioner

## 2014-07-25 VITALS — BP 112/66 | HR 75 | Temp 98.1°F | Resp 14 | Ht 67.0 in | Wt 230.8 lb

## 2014-07-25 DIAGNOSIS — J302 Other seasonal allergic rhinitis: Secondary | ICD-10-CM | POA: Diagnosis not present

## 2014-07-25 DIAGNOSIS — B379 Candidiasis, unspecified: Secondary | ICD-10-CM

## 2014-07-25 MED ORDER — FLUCONAZOLE 150 MG PO TABS: 150.0000 mg | ORAL_TABLET | Freq: Once | ORAL | Status: DC

## 2014-07-25 MED ORDER — TRIAMCINOLONE ACETONIDE 55 MCG/ACT NA AERO: 2.0000 | INHALATION_SPRAY | Freq: Every day | NASAL | Status: DC

## 2014-07-25 NOTE — Patient Instructions (Signed)
Minimize touching your right ear.   Nasacort 2 sprays in each nostril daily.   Zyrtec!   Call us if worse or not improving.

## 2014-07-25 NOTE — Progress Notes (Signed)
Pre visit review using our clinic review tool, if applicable. No additional management support is needed unless otherwise documented below in the visit note. 

## 2014-07-25 NOTE — Progress Notes (Signed)
   Subjective:    Patient ID: Lori Le, female    DOB: 1965-10-10, 49 y.o.   MRN: [id number]  HPI  Ms. [name] is a 49 yo with a CC of right ear pain, sore throat, and yeast infection.   1) Ear pain x 2 days, began after a shower, right side and sore throat x 1 week. Washed ear with peroxide, ear kit from pharmacy, and alcohol, hurts when swallowing.  Zyrtec- Saturday last dose   2) Thick white discharge and itching x 2 days.  Not tried anything; same symptoms as with previous   Review of Systems  Constitutional: Positive for fatigue. Negative for fever, chills and diaphoresis.  HENT: Positive for ear pain, hearing loss, postnasal drip, rhinorrhea, sneezing and sore throat. Negative for congestion, ear discharge and sinus pressure.        Popped on right, felt stopped up for a while  Eyes: Negative for visual disturbance.  Respiratory: Negative for chest tightness, shortness of breath and wheezing.   Cardiovascular: Negative for chest pain, palpitations and leg swelling.  Gastrointestinal: Negative for nausea, vomiting and diarrhea.  Genitourinary: Positive for vaginal discharge.      Objective:   Physical Exam  Constitutional: She is oriented to person, place, and time. She appears well-developed and well-nourished. No distress.  BP 112/66 mmHg  Pulse 75  Temp(Src) 98.1 F (36.7 C) (Oral)  Resp 14  Ht $R'5\' 7"'Ee$  (1.702 m)  Wt 230 lb 12.8 oz (104.69 kg)  BMI 36.14 kg/m2  SpO2 99%   HENT:  Head: Normocephalic and atraumatic.  Right Ear: External ear normal.  Left Ear: External ear normal.  Mouth/Throat: No oropharyngeal exudate.  TM's clear bilaterally  Eyes: EOM are normal. Pupils are equal, round, and reactive to light. Right eye exhibits no discharge. Left eye exhibits no discharge. No scleral icterus.  Cardiovascular: Normal rate, regular rhythm, normal heart sounds and intact distal pulses.  Exam reveals no gallop and no friction rub.   No murmur  heard. Pulmonary/Chest: Effort normal and breath sounds normal. No respiratory distress. She has no wheezes. She has no rales. She exhibits no tenderness.  Genitourinary:  Deferred  Neurological: She is alert and oriented to person, place, and time. No cranial nerve deficit. She exhibits normal muscle tone. Coordination normal.  Skin: Skin is warm and dry. No rash noted. She is not diaphoretic.  Psychiatric: She has a normal mood and affect. Her behavior is normal. Judgment and thought content normal.      Assessment & Plan:

## 2014-07-31 DIAGNOSIS — B379 Candidiasis, unspecified: Secondary | ICD-10-CM | POA: Insufficient documentation

## 2014-07-31 DIAGNOSIS — J309 Allergic rhinitis, unspecified: Secondary | ICD-10-CM | POA: Insufficient documentation

## 2014-07-31 NOTE — Assessment & Plan Note (Signed)
Diflucan sent to pharmacy. RTC for eval if not improved

## 2014-07-31 NOTE — Assessment & Plan Note (Signed)
Uncontrolled. Zyrtec and nasacort. FU prn worsening/failure to improve.

## 2014-08-10 ENCOUNTER — Other Ambulatory Visit: Payer: Self-pay | Admitting: Orthopedic Surgery

## 2014-08-10 DIAGNOSIS — M25562 Pain in left knee: Secondary | ICD-10-CM

## 2014-08-12 ENCOUNTER — Ambulatory Visit
Admission: RE | Admit: 2014-08-12 | Discharge: 2014-08-12 | Disposition: A | Discharge status: Home / Self Care | Payer: 59 | Source: Ambulatory Visit | Attending: Orthopedic Surgery | Admitting: Orthopedic Surgery

## 2014-08-12 DIAGNOSIS — M1712 Unilateral primary osteoarthritis, left knee: Secondary | ICD-10-CM | POA: Diagnosis not present

## 2014-08-12 DIAGNOSIS — M25462 Effusion, left knee: Secondary | ICD-10-CM | POA: Insufficient documentation

## 2014-08-12 DIAGNOSIS — M25562 Pain in left knee: Secondary | ICD-10-CM

## 2014-08-23 ENCOUNTER — Ambulatory Visit: Payer: 59

## 2014-08-24 ENCOUNTER — Encounter: Payer: Self-pay | Admitting: Nurse Practitioner

## 2014-08-24 ENCOUNTER — Other Ambulatory Visit: Payer: Self-pay | Admitting: Nurse Practitioner

## 2014-08-24 MED ORDER — PREDNISONE 10 MG PO TABS: ORAL_TABLET | ORAL | Status: DC

## 2014-08-24 NOTE — Telephone Encounter (Signed)
Patient telephoned again for her meds.

## 2014-08-25 ENCOUNTER — Ambulatory Visit (INDEPENDENT_AMBULATORY_CARE_PROVIDER_SITE_OTHER): Payer: 59 | Admitting: *Deleted

## 2014-08-25 DIAGNOSIS — E538 Deficiency of other specified B group vitamins: Secondary | ICD-10-CM

## 2014-08-25 MED ORDER — CYANOCOBALAMIN 1000 MCG/ML IJ SOLN
1000.0000 ug | Freq: Once | INTRAMUSCULAR | Status: AC
  Administered 2014-08-25: 1000 ug via INTRAMUSCULAR

## 2014-09-14 ENCOUNTER — Encounter
Admission: RE | Admit: 2014-09-14 | Discharge: 2014-09-14 | Disposition: A | Discharge status: Home / Self Care | Payer: 59 | Source: Ambulatory Visit | Attending: Orthopedic Surgery | Admitting: Orthopedic Surgery

## 2014-09-14 ENCOUNTER — Other Ambulatory Visit: Payer: Self-pay

## 2014-09-14 DIAGNOSIS — Z01812 Encounter for preprocedural laboratory examination: Secondary | ICD-10-CM | POA: Diagnosis present

## 2014-09-14 DIAGNOSIS — M1612 Unilateral primary osteoarthritis, left hip: Secondary | ICD-10-CM | POA: Diagnosis not present

## 2014-09-14 DIAGNOSIS — Z01810 Encounter for preprocedural cardiovascular examination: Secondary | ICD-10-CM | POA: Diagnosis present

## 2014-09-14 DIAGNOSIS — R609 Edema, unspecified: Secondary | ICD-10-CM | POA: Diagnosis not present

## 2014-09-14 HISTORY — DX: Gout, unspecified: M10.9

## 2014-09-14 HISTORY — DX: Other complications of anesthesia, initial encounter: T88.59XA

## 2014-09-14 HISTORY — DX: Localized edema: R60.0

## 2014-09-14 HISTORY — DX: Adverse effect of unspecified anesthetic, initial encounter: T41.45XA

## 2014-09-14 LAB — BASIC METABOLIC PANEL
Anion gap: 10 (ref 5–15)
BUN: 20 mg/dL (ref 6–20)
CHLORIDE: 98 mmol/L — AB (ref 101–111)
CO2: 32 mmol/L (ref 22–32)
Calcium: 9 mg/dL (ref 8.9–10.3)
Creatinine, Ser: 0.67 mg/dL (ref 0.44–1.00)
GFR calc Af Amer: >60 mL/min (ref >60)
GFR calc non Af Amer: >60 mL/min (ref >60)
GLUCOSE: 75 mg/dL (ref 65–99)
Potassium: 2.9 mmol/L — CL (ref 3.5–5.1)
SODIUM: 140 mmol/L (ref 135–145)

## 2014-09-14 LAB — URINALYSIS COMPLETE WITH MICROSCOPIC (ARMC ONLY)
BILIRUBIN URINE: NEGATIVE
Glucose, UA: NEGATIVE mg/dL
Hgb urine dipstick: NEGATIVE
Ketones, ur: NEGATIVE mg/dL
Leukocytes, UA: NEGATIVE
Nitrite: NEGATIVE
PH: 7 (ref 5.0–8.0)
Protein, ur: NEGATIVE mg/dL
Specific Gravity, Urine: 1.003 — ABNORMAL LOW (ref 1.005–1.030)
WBC, UA: NONE SEEN WBC/hpf (ref 0–5)

## 2014-09-14 LAB — SURGICAL PCR SCREEN
MRSA, PCR: NEGATIVE
Staphylococcus aureus: NEGATIVE

## 2014-09-14 LAB — CBC
HEMATOCRIT: 42.5 % (ref 35.0–47.0)
Hemoglobin: 13.7 g/dL (ref 12.0–16.0)
MCH: 28.9 pg (ref 26.0–34.0)
MCHC: 32.2 g/dL (ref 32.0–36.0)
MCV: 89.7 fL (ref 80.0–100.0)
Platelets: 276 10*3/uL (ref 150–440)
RBC: 4.74 MIL/uL (ref 3.80–5.20)
RDW: 12.6 % (ref 11.5–14.5)
WBC: 7.5 10*3/uL (ref 3.6–11.0)

## 2014-09-14 LAB — TYPE AND SCREEN
ABO/RH(D): B POS
Antibody Screen: NEGATIVE

## 2014-09-14 LAB — PROTIME-INR
INR: 0.91
PROTHROMBIN TIME: 12.5 s (ref 11.4–15.0)

## 2014-09-14 LAB — APTT: aPTT: 28 seconds (ref 24–36)

## 2014-09-14 LAB — ABO/RH: ABO/RH(D): B POS

## 2014-09-14 LAB — SEDIMENTATION RATE: SED RATE: 6 mm/h (ref 0–20)

## 2014-09-14 NOTE — Patient Instructions (Signed)
  Your procedure is scheduled on: 09/27/14 Tues Report to Day Surgery. To find out your arrival time please call [phone] between 1PM - 3PM on 09/26/14 Monday.  Remember: Instructions that are not followed completely may result in serious medical risk, up to and including death, or upon the discretion of your surgeon and anesthesiologist your surgery may need to be rescheduled.    ___x_ 1. Do not eat food or drink liquids after midnight. No gum chewing or hard candies.     ___x_ 2. No Alcohol for 24 hours before or after surgery.   ____ 3. Bring all medications with you on the day of surgery if instructed.    __x__ 4. Notify your doctor if there is any change in your medical condition     (cold, fever, infections).     Do not wear jewelry, make-up, hairpins, clips or nail polish.  Do not wear lotions, powders, or perfumes. You may wear deodorant.  Do not shave 48 hours prior to surgery. Men may shave face and neck.  Do not bring valuables to the hospital.    [facility] is not responsible for any belongings or valuables.               Contacts, dentures or bridgework may not be worn into surgery.  Leave your suitcase in the car. After surgery it may be brought to your room.  For patients admitted to the hospital, discharge time is determined by your                treatment team.   Patients discharged the day of surgery will not be allowed to drive home.   Please read over the following fact sheets that you were given:   MRSA Information   ____ Take these medicines the morning of surgery with A SIP OF WATER:    1.omeprazole  2. levothyroxine  3.   4.  5.  6.  ____ Fleet Enema (as directed)   __x__ Use CHG Soap as directed  ____ Use inhalers on the day of surgery  ____ Stop metformin 2 days prior to surgery    ____ Take 1/2 of usual insulin dose the night before surgery and none on the morning of surgery.   ____ Stop Coumadin/Plavix/aspirin on   ____ Stop  Anti-inflammatories on    ____ Stop supplements until after surgery.    ____ Bring C-Pap to the hospital.

## 2014-09-14 NOTE — OR Nursing (Signed)
Patient potassium level 2.9 [facility] with Dr Rudene Christians called and faxed result to office.  Will recheck potassium level on day of surgery.

## 2014-09-22 ENCOUNTER — Telehealth: Payer: Self-pay | Admitting: Obstetrics and Gynecology

## 2014-09-22 ENCOUNTER — Ambulatory Visit (HOSPITAL_COMMUNITY): Payer: 59 | Admitting: Psychiatry

## 2014-09-22 DIAGNOSIS — N912 Amenorrhea, unspecified: Secondary | ICD-10-CM

## 2014-09-22 NOTE — Telephone Encounter (Signed)
Pt called and she is having total hip surgery followed by a knee replacement on next Tuesday, she has not started her period like she is suppose to, and she thinks it may have something to do with stressing about the surgery, she does have an IUD, she wanted to know if there is something she can do to make it come on now or hold off so that she does not have to worry about her period coming on when she has the surgery. Pt would like a call back with advice.

## 2014-09-22 NOTE — Telephone Encounter (Signed)
Please let patient know that she has 2 options: 1) Provera tablets x 5 days, which will bring on her period within the next 7 days, or 2) progesterone OCP (i.e. Ortho Micronor) that she could take x 1 month which could potentially postpone menses, however she may still have some irregular spotting or bleeding.  Unfortunately nothing is going to make it come on within the next day or 2 outside of decreasing stress levels.

## 2014-09-23 ENCOUNTER — Other Ambulatory Visit: Payer: Self-pay

## 2014-09-23 MED ORDER — MEDROXYPROGESTERONE ACETATE 10 MG PO TABS: 10.0000 mg | ORAL_TABLET | Freq: Every day | ORAL | Status: DC

## 2014-09-23 NOTE — Telephone Encounter (Signed)
Called pt and left message informing her of the information below, and to cal back with which ever option she chooses to proceed with.

## 2014-09-23 NOTE — Telephone Encounter (Signed)
Pt calls back and states she would like to do provera trial to initiate period. Will send in RX for Provera to [facility] [place].

## 2014-09-27 ENCOUNTER — Inpatient Hospital Stay: Payer: 59

## 2014-09-27 ENCOUNTER — Inpatient Hospital Stay: Payer: 59 | Admitting: Anesthesiology

## 2014-09-27 ENCOUNTER — Ambulatory Visit: Payer: 59

## 2014-09-27 ENCOUNTER — Encounter
Admission: RE | Disposition: A | Discharge status: Skilled Nursing Facility | Payer: Self-pay | Source: Ambulatory Visit | Attending: Orthopedic Surgery

## 2014-09-27 ENCOUNTER — Encounter: Payer: Self-pay | Admitting: *Deleted

## 2014-09-27 ENCOUNTER — Inpatient Hospital Stay
Admission: RE | Admit: 2014-09-27 | Discharge: 2014-09-30 | DRG: 470 | Disposition: A | Discharge status: Skilled Nursing Facility | Payer: 59 | Source: Ambulatory Visit | Attending: Orthopedic Surgery | Admitting: Orthopedic Surgery

## 2014-09-27 DIAGNOSIS — M1612 Unilateral primary osteoarthritis, left hip: Secondary | ICD-10-CM | POA: Diagnosis present

## 2014-09-27 DIAGNOSIS — I10 Essential (primary) hypertension: Secondary | ICD-10-CM | POA: Diagnosis present

## 2014-09-27 DIAGNOSIS — E669 Obesity, unspecified: Secondary | ICD-10-CM | POA: Diagnosis present

## 2014-09-27 DIAGNOSIS — G8918 Other acute postprocedural pain: Secondary | ICD-10-CM

## 2014-09-27 DIAGNOSIS — E039 Hypothyroidism, unspecified: Secondary | ICD-10-CM | POA: Diagnosis present

## 2014-09-27 DIAGNOSIS — Z85820 Personal history of malignant melanoma of skin: Secondary | ICD-10-CM | POA: Diagnosis not present

## 2014-09-27 DIAGNOSIS — E785 Hyperlipidemia, unspecified: Secondary | ICD-10-CM | POA: Diagnosis present

## 2014-09-27 DIAGNOSIS — Z6839 Body mass index (BMI) 39.0-39.9, adult: Secondary | ICD-10-CM

## 2014-09-27 DIAGNOSIS — M1990 Unspecified osteoarthritis, unspecified site: Secondary | ICD-10-CM

## 2014-09-27 DIAGNOSIS — K219 Gastro-esophageal reflux disease without esophagitis: Secondary | ICD-10-CM | POA: Diagnosis present

## 2014-09-27 HISTORY — PX: TOTAL HIP ARTHROPLASTY: SHX124

## 2014-09-27 HISTORY — PX: JOINT REPLACEMENT: SHX530

## 2014-09-27 LAB — CBC
HEMATOCRIT: 33.2 % — AB (ref 35.0–47.0)
Hemoglobin: 10.8 g/dL — ABNORMAL LOW (ref 12.0–16.0)
MCH: 29.2 pg (ref 26.0–34.0)
MCHC: 32.4 g/dL (ref 32.0–36.0)
MCV: 90.3 fL (ref 80.0–100.0)
Platelets: 199 10*3/uL (ref 150–440)
RBC: 3.68 MIL/uL — ABNORMAL LOW (ref 3.80–5.20)
RDW: 12.8 % (ref 11.5–14.5)
WBC: 12.8 10*3/uL — ABNORMAL HIGH (ref 3.6–11.0)

## 2014-09-27 LAB — CREATININE, SERUM
Creatinine, Ser: 0.67 mg/dL (ref 0.44–1.00)
GFR calc Af Amer: >60 mL/min (ref >60)
GFR calc non Af Amer: >60 mL/min (ref >60)

## 2014-09-27 LAB — POCT PREGNANCY, URINE: Preg Test, Ur: NEGATIVE

## 2014-09-27 LAB — POTASSIUM: POTASSIUM: 4 mmol/L

## 2014-09-27 SURGERY — ARTHROPLASTY, HIP, TOTAL, ANTERIOR APPROACH
Anesthesia: Spinal | Site: Hip | Laterality: Left | Wound class: Clean

## 2014-09-27 MED ORDER — CEFAZOLIN SODIUM-DEXTROSE 2-3 GM-% IV SOLR
2.0000 g | Freq: Four times a day (QID) | INTRAVENOUS | Status: AC
  Administered 2014-09-27 (×2): 2 g via INTRAVENOUS
  Filled 2014-09-27 (×2): qty 50

## 2014-09-27 MED ORDER — CEFAZOLIN SODIUM-DEXTROSE 2-3 GM-% IV SOLR
2.0000 g | Freq: Once | INTRAVENOUS | Status: AC
  Administered 2014-09-27: 2 g via INTRAVENOUS

## 2014-09-27 MED ORDER — MIDAZOLAM HCL 5 MG/5ML IJ SOLN
INTRAMUSCULAR | Status: DC | PRN
  Administered 2014-09-27: 2 mg via INTRAVENOUS
  Administered 2014-09-27: 1 mg via INTRAVENOUS

## 2014-09-27 MED ORDER — POTASSIUM 99 MG PO TABS: 1.0000 | ORAL_TABLET | Freq: Every day | ORAL | Status: DC | PRN

## 2014-09-27 MED ORDER — METHOCARBAMOL 500 MG PO TABS
500.0000 mg | ORAL_TABLET | Freq: Four times a day (QID) | ORAL | Status: DC | PRN
  Administered 2014-09-27 – 2014-09-30 (×6): 500 mg via ORAL
  Filled 2014-09-27 (×6): qty 1

## 2014-09-27 MED ORDER — MEDROXYPROGESTERONE ACETATE 10 MG PO TABS
10.0000 mg | ORAL_TABLET | Freq: Every day | ORAL | Status: DC
  Administered 2014-09-28: 10 mg via ORAL
  Filled 2014-09-27 (×4): qty 1

## 2014-09-27 MED ORDER — ALLOPURINOL 100 MG PO TABS: 100.0000 mg | ORAL_TABLET | Freq: Every day | ORAL | Status: DC

## 2014-09-27 MED ORDER — OXYCODONE HCL 5 MG PO TABS
5.0000 mg | ORAL_TABLET | ORAL | Status: DC | PRN
  Administered 2014-09-27 – 2014-09-28 (×4): 5 mg via ORAL
  Administered 2014-09-28: 10 mg via ORAL
  Administered 2014-09-28: 5 mg via ORAL
  Administered 2014-09-28: 10 mg via ORAL
  Administered 2014-09-28: 5 mg via ORAL
  Administered 2014-09-29 – 2014-09-30 (×5): 10 mg via ORAL
  Administered 2014-09-30: 5 mg via ORAL
  Filled 2014-09-27: qty 1
  Filled 2014-09-27: qty 2
  Filled 2014-09-27 (×5): qty 1
  Filled 2014-09-27 (×2): qty 2
  Filled 2014-09-27: qty 1
  Filled 2014-09-27 (×4): qty 2
  Filled 2014-09-27: qty 1

## 2014-09-27 MED ORDER — ACETAMINOPHEN 325 MG PO TABS
650.0000 mg | ORAL_TABLET | Freq: Four times a day (QID) | ORAL | Status: DC | PRN
  Administered 2014-09-29: 650 mg via ORAL
  Filled 2014-09-27: qty 2

## 2014-09-27 MED ORDER — KETAMINE HCL 50 MG/ML IJ SOLN
INTRAMUSCULAR | Status: DC | PRN
  Administered 2014-09-27: 1 mg via INTRAMUSCULAR
  Administered 2014-09-27 (×2): 2 mg via INTRAMUSCULAR

## 2014-09-27 MED ORDER — GENTAMICIN SULFATE 40 MG/ML IJ SOLN
INTRAMUSCULAR | Status: AC
  Filled 2014-09-27: qty 2

## 2014-09-27 MED ORDER — POTASSIUM CHLORIDE ER 10 MEQ PO TBCR
10.0000 meq | EXTENDED_RELEASE_TABLET | Freq: Two times a day (BID) | ORAL | Status: DC
  Administered 2014-09-27: 10 meq via ORAL
  Filled 2014-09-27 (×5): qty 1

## 2014-09-27 MED ORDER — ZOLPIDEM TARTRATE 5 MG PO TABS: 5.0000 mg | ORAL_TABLET | Freq: Every evening | ORAL | Status: DC | PRN

## 2014-09-27 MED ORDER — TRANEXAMIC ACID 1000 MG/10ML IV SOLN
1000.0000 mg | Freq: Once | INTRAVENOUS | Status: AC
  Administered 2014-09-27: 1000 mg via INTRAVENOUS
  Filled 2014-09-27: qty 10

## 2014-09-27 MED ORDER — MORPHINE SULFATE 2 MG/ML IJ SOLN: 2.0000 mg | INTRAMUSCULAR | Status: DC | PRN

## 2014-09-27 MED ORDER — PROPOFOL 10 MG/ML IV BOLUS
INTRAVENOUS | Status: DC | PRN
  Administered 2014-09-27: 10 mg via INTRAVENOUS
  Administered 2014-09-27 (×2): 20 mg via INTRAVENOUS

## 2014-09-27 MED ORDER — TRIAMTERENE-HCTZ 37.5-25 MG PO TABS
1.0000 | ORAL_TABLET | Freq: Every day | ORAL | Status: DC
  Administered 2014-09-27 – 2014-09-30 (×4): 1 via ORAL
  Filled 2014-09-27 (×4): qty 1

## 2014-09-27 MED ORDER — FENTANYL CITRATE (PF) 100 MCG/2ML IJ SOLN: 25.0000 ug | INTRAMUSCULAR | Status: DC | PRN

## 2014-09-27 MED ORDER — LEVOTHYROXINE SODIUM 25 MCG PO TABS
137.0000 ug | ORAL_TABLET | Freq: Every day | ORAL | Status: DC
  Administered 2014-09-28 – 2014-09-30 (×3): 137 ug via ORAL
  Filled 2014-09-27 (×3): qty 1

## 2014-09-27 MED ORDER — DOCUSATE SODIUM 100 MG PO CAPS
100.0000 mg | ORAL_CAPSULE | Freq: Two times a day (BID) | ORAL | Status: DC
  Administered 2014-09-27 – 2014-09-30 (×6): 100 mg via ORAL
  Filled 2014-09-27 (×6): qty 1

## 2014-09-27 MED ORDER — PROPOFOL INFUSION 10 MG/ML OPTIME
INTRAVENOUS | Status: DC | PRN
  Administered 2014-09-27: 50 ug/kg/min via INTRAVENOUS

## 2014-09-27 MED ORDER — ONDANSETRON HCL 4 MG/2ML IJ SOLN: 4.0000 mg | Freq: Once | INTRAMUSCULAR | Status: DC | PRN

## 2014-09-27 MED ORDER — COLCHICINE 0.6 MG PO TABS: 0.6000 mg | ORAL_TABLET | Freq: Two times a day (BID) | ORAL | Status: DC

## 2014-09-27 MED ORDER — ACETAMINOPHEN 500 MG PO TABS
1000.0000 mg | ORAL_TABLET | Freq: Four times a day (QID) | ORAL | Status: AC
  Administered 2014-09-27 – 2014-09-28 (×4): 1000 mg via ORAL
  Filled 2014-09-27 (×4): qty 2

## 2014-09-27 MED ORDER — ALUM & MAG HYDROXIDE-SIMETH 200-200-20 MG/5ML PO SUSP: 30.0000 mL | ORAL | Status: DC | PRN

## 2014-09-27 MED ORDER — LACTATED RINGERS IV SOLN
INTRAVENOUS | Status: DC
  Administered 2014-09-27: 09:00:00 via INTRAVENOUS
  Administered 2014-09-27: 50 mL/h via INTRAVENOUS

## 2014-09-27 MED ORDER — ONDANSETRON HCL 4 MG/2ML IJ SOLN
INTRAMUSCULAR | Status: DC | PRN
  Administered 2014-09-27: 4 mg via INTRAVENOUS

## 2014-09-27 MED ORDER — BUPIVACAINE-EPINEPHRINE (PF) 0.25% -1:200000 IJ SOLN
INTRAMUSCULAR | Status: AC
  Filled 2014-09-27: qty 30

## 2014-09-27 MED ORDER — CEFAZOLIN SODIUM-DEXTROSE 2-3 GM-% IV SOLR
INTRAVENOUS | Status: AC
Start: 2014-09-27 — End: 2014-09-27
  Filled 2014-09-27: qty 50

## 2014-09-27 MED ORDER — SODIUM CHLORIDE 0.9 % IV SOLN
INTRAVENOUS | Status: DC
  Administered 2014-09-27 – 2014-09-28 (×3): via INTRAVENOUS

## 2014-09-27 MED ORDER — FENTANYL CITRATE (PF) 100 MCG/2ML IJ SOLN
INTRAMUSCULAR | Status: DC | PRN
  Administered 2014-09-27: 50 ug via INTRAVENOUS

## 2014-09-27 MED ORDER — LORATADINE 10 MG PO TABS
10.0000 mg | ORAL_TABLET | Freq: Every day | ORAL | Status: DC
  Administered 2014-09-28: 10 mg via ORAL
  Filled 2014-09-27 (×3): qty 1

## 2014-09-27 MED ORDER — BISACODYL 10 MG RE SUPP
10.0000 mg | Freq: Every day | RECTAL | Status: DC | PRN
  Administered 2014-09-30: 10 mg via RECTAL
  Filled 2014-09-27: qty 1

## 2014-09-27 MED ORDER — FLUTICASONE PROPIONATE 50 MCG/ACT NA SUSP: 2.0000 | Freq: Every day | NASAL | Status: DC

## 2014-09-27 MED ORDER — DIPHENHYDRAMINE HCL 12.5 MG/5ML PO ELIX: 12.5000 mg | ORAL_SOLUTION | ORAL | Status: DC | PRN

## 2014-09-27 MED ORDER — BUPROPION HCL ER (XL) 150 MG PO TB24: 150.0000 mg | ORAL_TABLET | Freq: Every day | ORAL | Status: DC

## 2014-09-27 MED ORDER — SODIUM CHLORIDE 0.9 % IV SOLN
250.0000 mg | INTRAVENOUS | Status: DC | PRN
  Administered 2014-09-27: 5 ug/kg/min via INTRAVENOUS

## 2014-09-27 MED ORDER — METHOCARBAMOL 1000 MG/10ML IJ SOLN
500.0000 mg | Freq: Four times a day (QID) | INTRAVENOUS | Status: DC | PRN
  Filled 2014-09-27: qty 5

## 2014-09-27 MED ORDER — FUROSEMIDE 40 MG PO TABS
40.0000 mg | ORAL_TABLET | Freq: Every day | ORAL | Status: DC
  Administered 2014-09-27 – 2014-09-30 (×4): 40 mg via ORAL
  Filled 2014-09-27 (×4): qty 1

## 2014-09-27 MED ORDER — ENOXAPARIN SODIUM 40 MG/0.4ML ~~LOC~~ SOLN
40.0000 mg | SUBCUTANEOUS | Status: DC
  Administered 2014-09-28 – 2014-09-30 (×3): 40 mg via SUBCUTANEOUS
  Filled 2014-09-27 (×3): qty 0.4

## 2014-09-27 MED ORDER — PHENOL 1.4 % MT LIQD: 1.0000 | OROMUCOSAL | Status: DC | PRN

## 2014-09-27 MED ORDER — ACETAMINOPHEN 650 MG RE SUPP: 650.0000 mg | Freq: Four times a day (QID) | RECTAL | Status: DC | PRN

## 2014-09-27 MED ORDER — MENTHOL 3 MG MT LOZG: 1.0000 | LOZENGE | OROMUCOSAL | Status: DC | PRN

## 2014-09-27 MED ORDER — PANTOPRAZOLE SODIUM 40 MG PO TBEC
40.0000 mg | DELAYED_RELEASE_TABLET | Freq: Every day | ORAL | Status: DC
  Administered 2014-09-28 – 2014-09-30 (×3): 40 mg via ORAL
  Filled 2014-09-27 (×3): qty 1

## 2014-09-27 MED ORDER — BUPIVACAINE HCL (PF) 0.5 % IJ SOLN
INTRAMUSCULAR | Status: DC | PRN
  Administered 2014-09-27: 3 mL

## 2014-09-27 MED ORDER — MAGNESIUM CITRATE PO SOLN: 1.0000 | Freq: Once | ORAL | Status: AC | PRN

## 2014-09-27 MED ORDER — ONDANSETRON HCL 4 MG PO TABS
4.0000 mg | ORAL_TABLET | Freq: Four times a day (QID) | ORAL | Status: DC | PRN
  Administered 2014-09-28 – 2014-09-30 (×5): 4 mg via ORAL
  Filled 2014-09-27 (×5): qty 1

## 2014-09-27 MED ORDER — MAGNESIUM HYDROXIDE 400 MG/5ML PO SUSP
30.0000 mL | Freq: Every day | ORAL | Status: DC | PRN
  Administered 2014-09-29: 30 mL via ORAL
  Filled 2014-09-27: qty 30

## 2014-09-27 MED ORDER — GENTAMICIN SULFATE 40 MG/ML IJ SOLN
INTRAMUSCULAR | Status: DC | PRN
Start: 2014-09-27 — End: 2014-09-27
  Administered 2014-09-27: 2 mL

## 2014-09-27 MED ORDER — ONDANSETRON HCL 4 MG/2ML IJ SOLN
4.0000 mg | Freq: Four times a day (QID) | INTRAMUSCULAR | Status: DC | PRN
  Administered 2014-09-27 – 2014-09-28 (×3): 4 mg via INTRAVENOUS
  Filled 2014-09-27 (×4): qty 2

## 2014-09-27 MED ORDER — HYDROMORPHONE HCL 1 MG/ML IJ SOLN: 0.2500 mg | INTRAMUSCULAR | Status: DC | PRN

## 2014-09-27 SURGICAL SUPPLY — 44 items
BLADE SAW 1/2 (BLADE) ×3 IMPLANT
BNDG COHESIVE 6X5 TAN STRL LF (GAUZE/BANDAGES/DRESSINGS) ×6 IMPLANT
CANISTER SUCT 1200ML W/VALVE (MISCELLANEOUS) ×3 IMPLANT
CAPT HIP TOTAL 3 ×2 IMPLANT
CATH FOL LEG HOLDER (MISCELLANEOUS) ×3 IMPLANT
CATH TRAY 16F METER LATEX (MISCELLANEOUS) ×3 IMPLANT
CHLORAPREP W/TINT 26ML (MISCELLANEOUS) ×3 IMPLANT
DRAPE C-ARM XRAY 36X54 (DRAPES) ×3 IMPLANT
DRAPE INCISE IOBAN 66X60 STRL (DRAPES) IMPLANT
DRAPE POUCH INSTRU U-SHP 10X18 (DRAPES) ×3 IMPLANT
DRAPE SHEET LG 3/4 BI-LAMINATE (DRAPES) ×9 IMPLANT
DRAPE TABLE BACK 80X90 (DRAPES) ×3 IMPLANT
ELECT BLADE 6.5 EXT (BLADE) ×3 IMPLANT
GAUZE SPONGE 4X4 12PLY STRL (GAUZE/BANDAGES/DRESSINGS) ×3 IMPLANT
GLOVE BIOGEL PI IND STRL 9 (GLOVE) ×1 IMPLANT
GLOVE BIOGEL PI INDICATOR 9 (GLOVE) ×2
GLOVE SURG ORTHO 9.0 STRL STRW (GLOVE) ×3 IMPLANT
GOWN SPECIALTY ULTRA XL (MISCELLANEOUS) ×3 IMPLANT
GOWN STRL REUS W/ TWL LRG LVL3 (GOWN DISPOSABLE) ×1 IMPLANT
GOWN STRL REUS W/TWL LRG LVL3 (GOWN DISPOSABLE) ×3
HEMOVAC 400CC 10FR (MISCELLANEOUS) ×3 IMPLANT
HOOD PEEL AWAY FACE SHEILD DIS (HOOD) ×3 IMPLANT
MAT BLUE FLOOR 46X72 FLO (MISCELLANEOUS) ×3 IMPLANT
NDL SAFETY 18GX1.5 (NEEDLE) ×3 IMPLANT
NDL SPNL 18GX3.5 QUINCKE PK (NEEDLE) ×1 IMPLANT
NEEDLE SPNL 18GX3.5 QUINCKE PK (NEEDLE) ×3 IMPLANT
NS IRRIG 1000ML POUR BTL (IV SOLUTION) ×3 IMPLANT
PACK HIP COMPR (MISCELLANEOUS) ×3 IMPLANT
SOL PREP PVP 2OZ (MISCELLANEOUS) ×3
SOLUTION PREP PVP 2OZ (MISCELLANEOUS) ×1 IMPLANT
STAPLER SKIN PROX 35W (STAPLE) ×3 IMPLANT
STRAP SAFETY BODY (MISCELLANEOUS) ×3 IMPLANT
SUT DVC 2 QUILL PDO  T11 36X36 (SUTURE) ×2
SUT DVC 2 QUILL PDO T11 36X36 (SUTURE) ×1 IMPLANT
SUT DVC QUILL MONODERM 30X30 (SUTURE) ×3 IMPLANT
SUT ETHIBOND NAB CT1 #1 30IN (SUTURE) ×3 IMPLANT
SUT SILK 0 (SUTURE) ×3
SUT SILK 0 30XBRD TIE 6 (SUTURE) ×1 IMPLANT
SUT VIC AB 1 CT1 36 (SUTURE) ×3 IMPLANT
SYR 20CC LL (SYRINGE) ×3 IMPLANT
SYR 30ML LL (SYRINGE) ×3 IMPLANT
TAPE MICROFOAM 4IN (TAPE) ×3 IMPLANT
TUBE KAMVAC SUCTION (TUBING) ×3 IMPLANT
WATER STERILE IRR 1000ML POUR (IV SOLUTION) ×3 IMPLANT

## 2014-09-27 NOTE — Care Management Note (Addendum)
Case Management Note  Patient Details  Name: RAFEEF LAU MRN: [id number] Date of Birth: [birth year]  Subjective/Objective:                  Met with patient and her husband to discuss discharge planning. Patient just received from PACU. She agrees to SNF or home health pending PT evaluation tomorrow. She has a rolling walker that she will borrow; her husband will bring into the hospital for evaluation. If she is able to go home she wishes to go to her moms house in [address] [place]. She will provide address to her mothers. She uses [facility] [phone]. She has no preference for home health agency.  09/27/14 0835: Met again with patient and she would like to use Lovenox at discharge. Lovenox 71m #14 called in to [facility] [place] for price.  Address to her mom's shared with [facility]: [id number] [place] [id number]   Action/Plan: RNCM will continue to follow. [facility]  Expected Discharge Date:                  Expected Discharge Plan:     In-House Referral:     Discharge planning Services  CM Consult  Post Acute Care Choice:    Choice offered to:  Patient, Spouse  DME Arranged:    DME Agency:     HH Arr[place]:    [place]     Status of Service:     Medicare Important Message Given:    Date Medicare IM Given:    Medicare IM give by:    Date Additional Medicare IM Given:    Additional Medicare Important Message give by:     If discussed [place], dates discussed:    Additional Comments: 09/28/14: PT is recommending SNF at discharge. RNCM to cancel Lovenox if patient goes to SNF and update [facility] on SNF bed.  09/30/14 0837: [facility]   AMarshell Garfinkel RN 09/27/2014, 1:26 PM

## 2014-09-27 NOTE — Anesthesia Preprocedure Evaluation (Signed)
Anesthesia Evaluation  Patient identified by MRN, date of birth, ID band Patient awake    Reviewed: Allergy & Precautions, NPO status , Patient's Chart, lab work & pertinent test results  History of Anesthesia Complications (+) PONV  Airway Mallampati: II  TM Distance: >3 FB Neck ROM: Full    Dental no notable dental hx.    Pulmonary neg pulmonary ROS,  breath sounds clear to auscultation  Pulmonary exam normal       Cardiovascular Exercise Tolerance: Good hypertension, Pt. on medications Normal cardiovascular examRhythm:Regular Rate:Normal     Neuro/Psych  Headaches, Depression    GI/Hepatic Neg liver ROS, GERD-  Medicated and Controlled,  Endo/Other  Hypothyroidism   Renal/GU negative Renal ROS  negative genitourinary   Musculoskeletal  (+) Arthritis -, Osteoarthritis,    Abdominal   Peds negative pediatric ROS (+)  Hematology negative hematology ROS (+)   Anesthesia Other Findings   Reproductive/Obstetrics negative OB ROS                             Anesthesia Physical Anesthesia Plan  ASA: III  Anesthesia Plan: Spinal   Post-op Pain Management:    Induction: Intravenous  Airway Management Planned: Nasal Cannula  Additional Equipment:   Intra-op Plan:   Post-operative Plan:   Informed Consent: I have reviewed the patients History and Physical, chart, labs and discussed the procedure including the risks, benefits and alternatives for the proposed anesthesia with the patient or authorized representative who has indicated his/her understanding and acceptance.     Plan Discussed with: CRNA and Surgeon  Anesthesia Plan Comments:         Anesthesia Quick Evaluation

## 2014-09-27 NOTE — Progress Notes (Signed)
s/p left hip replacement, left hip foam dressing dry/clean/intact- with hemovac drain, maintain hip precautions, pain meds given as needed (see MAR). Tolerated clear liq diet well. Foley patent- draining clear yellow urine.  IV site left forearm intact- NS infusing 100 ml/hr.

## 2014-09-27 NOTE — Anesthesia Procedure Notes (Signed)
Spinal Patient location during procedure: OR Start time: 09/27/2014 7:16 AM End time: 09/27/2014 7:18 AM Staffing Anesthesiologist: Lorane Gell Performed by: anesthesiologist  Preanesthetic Checklist Completed: patient identified, site marked, surgical consent, pre-op evaluation, timeout performed, IV checked, risks and benefits discussed and monitors and equipment checked Spinal Block Patient position: sitting Prep: ChloraPrep Patient monitoring: heart rate, continuous pulse ox and blood pressure Approach: midline Location: L3-4 Injection technique: single-shot Needle Needle type: Quincke  Needle gauge: 25 G Needle length: 9 cm Assessment Sensory level: T8 Additional Notes Pt tolerated procedure well.  No immediate complications

## 2014-09-27 NOTE — Progress Notes (Signed)
Dr.Menz here to see patient- discussing how the surgery went with patient.

## 2014-09-27 NOTE — Op Note (Signed)
09/27/2014  9:39 AM  PATIENT:  Lori Le  49 y.o. female  PRE-OPERATIVE DIAGNOSIS:  OSTEOARTHRITIS  POST-OPERATIVE DIAGNOSIS:  OSTEOARTHRITIS  PROCEDURE:  Procedure(s): TOTAL HIP ARTHROPLASTY ANTERIOR APPROACH (Left)  SURGEON: Laurene Footman, MD  ASSISTANTS: None  ANESTHESIA:   spinal  EBL:  Total I/O In: 1200 [I.V.:1200] Out: 850 [Urine:600; Blood:250]  BLOOD ADMINISTERED:none  DRAINS: (2) Hemovact drain(s) in the Subcutaneous layer with  Suction Open   LOCAL MEDICATIONS USED:  MARCAINE     SPECIMEN:  Source of Specimen:  Femoral head  DISPOSITION OF SPECIMEN:  PATHOLOGY  COUNTS:  YES  TOURNIQUET:  * No tourniquets in log *  IMPLANTS: Medacta Mpact 56 mm DM cup with liner, 4 AMIS stem And M 28 mm head  DICTATION: .Dragon Dictation  The patient was brought to the operating room and after spinal anesthesia was obtained the patient  was placed on the operative table with the ipsilateral foot into the Medacta attachment, contralateral leg on a well-padded table. C-arm was brought in and preop template x-ray taken. After prepping and draping in usual sterile fashion appropriate patient identification and timeout procedures were completed. Anterior approach to the hip was obtained and centered over the greater trochanter and TFL muscle. The subcutaneous tissue was incised hemostasis being achieved by electrocautery. TFL fascia was incised and the muscle retracted laterally deep retractor placed. The lateral femoral circumflex vessels were identified and ligated. The anterior capsule was exposed and a capsulotomy performed. The neck was identified and a femoral neck cut carried out with a saw. The head was removed without difficulty and showed sclerotic femoral head and acetabulum. Reaming was carried out to 56  mm and a 56 mm cup trial gave appropriate tightness to the acetabular component a 56  cup was impacted into position. The leg was then externally rotated and  ischiofemoral and patellofemoral releases carried out. The femur was sequentially broached to a size 4, size 4 stem and S 28 trials were placed and the final components chosen. The 4  stem was inserted along with a MM  28 mm head and 56  mm liner. The hip was reduced and was stable the wound was thoroughly irrigated with a dilute Betadine solution. The deep fascia view. Using a heavy Quill after infiltration of 30 cc of quarter percent Sensorcaine with epinephrine. Subcutaneous drains were then inserted to a Quill to close the skin with skin staples Xeroform 4 x 4's ABDs and tape patient was sent to recovery in stable condition.   PLAN OF CARE: Admit to inpatient

## 2014-09-27 NOTE — H&P (Signed)
Reviewed paper H+P, will be scanned into chart. No changes noted.  

## 2014-09-27 NOTE — Transfer of Care (Signed)
Immediate Anesthesia Transfer of Care Note  Patient: Lori Le  Procedure(s) Performed: Procedure(s): TOTAL HIP ARTHROPLASTY ANTERIOR APPROACH (Left)  Patient Location: PACU  Anesthesia Type:Spinal  Level of Consciousness: awake, alert , oriented and patient cooperative  Airway & Oxygen Therapy: Patient Spontanous Breathing and Patient connected to nasal cannula oxygen  Post-op Assessment: Report given to RN and Post -op Vital signs reviewed and stable  Post vital signs: Reviewed and stable  Last Vitals:  Filed Vitals:   09/27/14 0938  BP: 111/63  Pulse: 74  Temp: 36.2 C  Resp: 16    Complications: No apparent anesthesia complications

## 2014-09-28 LAB — BASIC METABOLIC PANEL
Anion gap: 5 (ref 5–15)
BUN: 8 mg/dL (ref 6–20)
CO2: 30 mmol/L (ref 22–32)
Calcium: 7.9 mg/dL — ABNORMAL LOW (ref 8.9–10.3)
Chloride: 103 mmol/L (ref 101–111)
Creatinine, Ser: 0.6 mg/dL (ref 0.44–1.00)
GFR calc Af Amer: >60 mL/min (ref >60)
GFR calc non Af Amer: >60 mL/min (ref >60)
GLUCOSE: 95 mg/dL (ref 65–99)
Potassium: 3.3 mmol/L — ABNORMAL LOW (ref 3.5–5.1)
Sodium: 138 mmol/L (ref 135–145)

## 2014-09-28 MED ORDER — POTASSIUM CHLORIDE 20 MEQ PO PACK
20.0000 meq | PACK | Freq: Two times a day (BID) | ORAL | Status: AC
  Administered 2014-09-28 (×2): 20 meq via ORAL
  Filled 2014-09-28 (×2): qty 1

## 2014-09-28 NOTE — Clinical Social Work Note (Signed)
Clinical Social Work Assessment  Patient Details  Name: Lori Le MRN: [id number] Date of Birth: Mar 21, 1966  Date of referral:  09/28/14               Reason for consult:  Facility Placement                Permission sought to share information with:  Chartered certified accountant granted to share information::  Yes, Verbal Permission Granted  Name::      [place]::   [place]  Relationship::     Contact Information:     Housing/Transportation Living arrangements for the past 2 months:  [place] of Information:  Patient Patient Interpreter Needed:  None Criminal Activity/Legal Involvement Pertinent to Current Situation/Hospitalization:  No - Comment as needed Significant Relationships:  Spouse Lives with:  Spouse Do you feel safe going back to the place where you live?  Yes Need for family participation in patient care:  No (Coment)  Care giving concerns:  Patient lives with her husband in [place].    Social Worker assessment / plan: Holiday representative (CSW) met with patient to discuss SNF. CSW introduced self and explained role of CSW department. Patient was alert and oriented. Patient was sitting up in the bed and was pleasant throughout assessment. Patient reported that she lives in [place] with her husband Lori Le and works in [place]. CSW explained that PT is recommending SNF. Per PT patient was debilated by pain today and only walked 6 feet this afternoon. CSW made Lori Le aware of above. Patient is agreeable to SNF search in [facility] and prefers [facility] CSW encourgaed patient to choose a second SNF choice in case Lori Le was full.   CSW presented bed offers to patient. Patient reported that she would review offers with her family and give CSW choice tomorrow. CSW will continue to follow and assist as needed.   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   Patient/Family's Understanding of and Emotional Response to Diagnosis, Current Treatment, and Prognosis: Patient was pleasant and sitting up in the bed. Patient thanked CSW for visit and assisting with placement.   Emotional Assessment Appearance:  Appears stated age Attitude/Demeanor/Rapport:    Affect (typically observed):  Pleasant, Accepting Orientation:  Oriented to Self, Oriented to Place, Oriented to  Time, Oriented to Situation Alcohol / Substance use:  Not Applicable Psych involvement (Current and /or in the community):  No (Comment)  Discharge Needs  Concerns to be addressed:  Discharge Planning Concerns Readmission within the last 30 days:  No Current discharge risk:  None Barriers to Discharge:  Continued Medical Work up   Lori Freshwater, LCSW 09/28/2014, 3:31 PM

## 2014-09-28 NOTE — Evaluation (Signed)
Occupational Therapy Evaluation Patient Details Name: KRISTEL DURKEE MRN: [id number] DOB: 04/28/1965 Today's Date: 09/28/2014    History of Present Illness This patient is a 49 year old female who came to [facility] for a L THR anterior approach.   Clinical Impression   This patient is a 49 year old female who came to [facility] for a L total hip replacement (anterior approach) .  Patient lives in a one story home with 2 steps to enter.  She had been independent with ADL and functional mobility. She now requires moderate assistance for lower body dressing practicing Donned/doffed socks and pants to knees (drain still in place).  She would benefit from Occupational Therapy for ADL/functional mobility training while .staying within hip precautoins (anterior approach)       Follow Up Recommendations       Equipment Recommendations       Recommendations for Other Services       Precautions / Restrictions Precautions Precautions: Anterior Hip Restrictions Weight Bearing Restrictions: Yes LLE Weight Bearing: Weight bearing as tolerated      Mobility Bed Mobility          Transfers            Balance                                ADL                                         General ADL Comments: Patient had been independent prior but now needs moderate assist to donn/doff socks and practice pants to knees using hip kit.     Vision     Perception     Praxis      Pertinent Vitals/Pain      Hand Dominance     Extremity/Trunk Assessment Upper Extremity Assessment Upper Extremity Assessment: Overall WFL for tasks assessed         Communication Communication Communication: No difficulties   Cognition Arousal/Alertness: Awake/alert Behavior During Therapy: WFL for tasks assessed/performed Overall Cognitive Status: Within Functional Limits for tasks assessed                     General Comments        Exercises       Shoulder Instructions      Home Living Family/patient expects to be discharged to:: Private residence Living Arrangements: Parent (If patient is dishcarged from hospital to home she will stay with her parents) Available Help at Discharge: Family Type of Home: House Home Access: Stairs to enter Technical brewer of Steps: 2 Entrance Stairs-Rails: None Home Layout: One level     Bathroom Shower/Tub: Chief Strategy Officer:  (Has a Secondary school teacher)          Prior Functioning/Environment Level of Independence: Independent        Comments:     OT Diagnosis: Generalized weakness   OT Problem List: Decreased knowledge of use of DME or AE;Decreased knowledge of precautions   OT Treatment/Interventions:      OT Goals(Current goals can be found in the care plan section) Acute Rehab OT Goals Patient Stated Goal: To have less pain. rehab if needed OT Goal Formulation: With patient Time For Goal Achievement: 10/12/14  OT Frequency:     Barriers to D/C:            Co-evaluation              End of Session Equipment Utilized During Treatment:  (Hip kit)  Activity Tolerance: Patient limited by pain Patient left: in chair;with chair alarm set;with call bell/phone within reach   Time: 1155-1216 OT Time Calculation (min): 21 min Charges:  OT General Charges $OT Visit: 1 Procedure OT Evaluation $Initial OT Evaluation Tier I: 1 Procedure OT Treatments $Self Care/Home Management : 8-22 mins G-Codes:    Myrene Galas, MS/OTR/L  09/28/2014, 1:02 PM

## 2014-09-28 NOTE — Anesthesia Post-op Follow-up Note (Signed)
  Anesthesia Pain Follow-up Note  Patient: Lori Le  Day #: 1  Date of Follow-up: 09/28/2014 Time: 7:31 AM  Last Vitals:  Filed Vitals:   09/28/14 0455  BP: 123/57  Pulse: 76  Temp: 36.6 C  Resp: 18    Level of Consciousness: alert  Pain: mild   Side Effects:None  Catheter Site Exam:  Plan: D/C from anesthesia care  Blima Singer

## 2014-09-28 NOTE — Evaluation (Signed)
Physical Therapy Evaluation Patient Details Name: Lori Le MRN: [id number] DOB: 1965/05/17 Today's Date: 09/28/2014   History of Present Illness  Pt is a 49 y.o. female s/p L THA anterior approach 09/27/14.  Clinical Impression  Currently pt demonstrates impairments with L hip and L knee pain, L LE strength, balance, and limitations with functional mobility.  Prior to admission, pt was independent without AD.  Pt lives with her husband but reports that he works full time and she will go to her parents home when she is ready to discharge to home.  Currently pt is mod assist supine to sit, mod to max assist to stand with RW, and mod assist to ambulate a few feet with RW bed to recliner.  Pt required multiple repetitions to perform bed mobility and sit to/from stand transfers with RW d/t c/o significant L hip and L knee pain and also requiring significant extra time for mobility d/t this pain.  Pt reporting her L knee also hurting post surgery (nursing aware and reports MD Rudene Christians also aware).  Pt would benefit from skilled PT to address above noted impairments and functional limitations.  Recommend pt discharge to STR when medically appropriate.     Follow Up Recommendations SNF    Equipment Recommendations   (pt has borrowed RW; need to assess for appropriate fit)    Recommendations for Other Services       Precautions / Restrictions Precautions Precautions: Anterior Hip Restrictions Weight Bearing Restrictions: Yes LLE Weight Bearing: Weight bearing as tolerated      Mobility  Bed Mobility Overal bed mobility: Needs Assistance Bed Mobility: Supine to Sit     Supine to sit: Mod assist;HOB elevated     General bed mobility comments: assist for trunk, L LE, and assist scooting to edge of bed; increased time to perform and multiple trials required to get OOB d/t significant L hip and L knee pain  Transfers Overall transfer level: Needs assistance Equipment used: Rolling walker  (2 wheeled) Transfers: Sit to/from Stand Sit to Stand: Mod assist;Max assist         General transfer comment: pt required vc's for hand and feet placement; multiple trials required to stand d/t significant L hip and L knee pain  Ambulation/Gait Ambulation/Gait assistance: Min assist;Mod assist Ambulation Distance (Feet): 3 Feet Assistive device: Rolling walker (2 wheeled)       General Gait Details: antalgic, decreased stance time L LE, decreased cadence d/t L hip and knee pain; pt required vc's for stepping technique and to increase UE support through [employer]    Modified Rankin (Stroke Patients Only)       Balance Overall balance assessment: Needs assistance Sitting-balance support: No upper extremity supported Sitting balance-Leahy Scale: Fair (d/t L hip pain)     Standing balance support: Bilateral upper extremity supported Standing balance-Leahy Scale: Fair                               Pertinent Vitals/Pain Pain Assessment: 0-10 Pain Score: 10-Worst pain ever (2/10 beginning of session; 12/10 with mobility; 5/10 end of session) Pain Location: L hip and L knee Pain Descriptors / Indicators: Cramping;Sharp;Shooting;Spasm Pain Intervention(s): Limited activity within patient's tolerance;Monitored during session;Repositioned (Nursing notified regarding pt's pain)  HR 76 bpm and O2 99% on room air end of session.    Home Living Family/patient  expects to be discharged to:: Private residence Living Arrangements: Parent (pt plans to discharge to her parents home) Available Help at Discharge: Family (pt's parents (pt's husband will be working)) Type of Home: House Home Access: Stairs to enter Entrance Stairs-Rails: None Technical brewer of Steps: 2 Home Layout: One level Home Equipment: None      Prior Function Level of Independence: Independent               Hand Dominance         Extremity/Trunk Assessment   Upper Extremity Assessment: Overall WFL for tasks assessed           Lower Extremity Assessment: LLE deficits/detail         Communication   Communication: No difficulties  Cognition Arousal/Alertness: Awake/alert Behavior During Therapy: WFL for tasks assessed/performed Overall Cognitive Status: Within Functional Limits for tasks assessed                      General Comments General comments (skin integrity, edema, etc.): L>R knee valgus  Nursing cleared pt for participation in physical therapy but noting pt with nausea this AM.  Pt reports pain medications give her nausea issues.    Exercises  Treatment:  Performed x10 B LE semi-supine therapeutic exercise:  Quad sets with 3 second holds (AROM R; AROM L); hip Adduction isometrics x3 seconds (pillow between thighs) (AROM B LE's); ankle pumps (AROM B LE's); gluteal sets x3 second holds (AROM B); heelslides (AROM R; AAROM L); SAQ's (AROM R; AAROM L); hip abduction/adduction (AROM R; AAROM L); and SLR (AROM R; AAROM L).  Pt required vc's and tactile cues for correct technique with ex's.  Extra time to perform d/t L hip and L knee pain.      Assessment/Plan    PT Assessment Patient needs continued PT services  PT Diagnosis Difficulty walking;Acute pain   PT Problem List Decreased strength;Decreased activity tolerance;Decreased balance;Decreased mobility;Decreased knowledge of use of DME;Decreased knowledge of precautions;Pain  PT Treatment Interventions DME instruction;Gait training;Stair training;Functional mobility training;Therapeutic activities;Therapeutic exercise;Balance training;Patient/family education   PT Goals (Current goals can be found in the Care Plan section) Acute Rehab PT Goals Patient Stated Goal: To have less pain PT Goal Formulation: With patient Time For Goal Achievement: 10/12/14 Potential to Achieve Goals: Good    Frequency BID   Barriers to discharge Other  (comment) (Level of assist)      Co-evaluation               End of Session Equipment Utilized During Treatment: Gait belt Activity Tolerance: Patient limited by pain Patient left: in chair;with call bell/phone within reach;with chair alarm set;with SCD's reapplied (B heels elevated via pillow) Nurse Communication: Mobility status (Pt's pain (plan to give pain medications with nausea meds later this AM))         Time: 8185-6314 PT Time Calculation (min) (ACUTE ONLY): 60 min   Charges:   PT Evaluation $Initial PT Evaluation Tier I: 1 Procedure PT Treatments $Therapeutic Exercise: 8-22 mins $Therapeutic Activity: 8-22 mins   PT G CodesLeitha Bleak 2014-10-11, 10:21 AM [name], [place]

## 2014-09-28 NOTE — Anesthesia Postprocedure Evaluation (Cosign Needed)
  Anesthesia Post-op Note  Patient: Lori Le  Procedure(s) Performed: Procedure(s): TOTAL HIP ARTHROPLASTY ANTERIOR APPROACH (Left)  Anesthesia type:Spinal  Patient location: Floor  Post pain: Pain level controlled  Post assessment: Post-op Vital signs reviewed, Patient's Cardiovascular Status Stable, Respiratory Function Stable, Patent Airway and No signs of Nausea or vomiting  Post vital signs: Reviewed and stable  Last Vitals:  Filed Vitals:   09/28/14 0455  BP: 123/57  Pulse: 76  Temp: 36.6 C  Resp: 18    Level of consciousness: awake, alert  and patient cooperative  Complications: No apparent anesthesia complications

## 2014-09-28 NOTE — Progress Notes (Signed)
   Subjective: 1 Day Post-Op Procedure(s) (LRB): TOTAL HIP ARTHROPLASTY ANTERIOR APPROACH (Left) Patient reports pain as mild.   Patient is well, and has had no acute complaints or problems We will start therapy today.  Plan is to go Home after hospital stay.  Objective: Vital signs in last 24 hours: Temp:  [97.2 F (36.2 C)-99.3 F (37.4 C)] 98.7 F (37.1 C) (06/29 0758) Pulse Rate:  [61-84] 80 (06/29 0758) Resp:  [12-18] 16 (06/29 0758) BP: (104-136)/(45-81) 120/50 mmHg (06/29 0758) SpO2:  [95 %-100 %] 100 % (06/29 0758) FiO2 (%):  [21 %] 21 % (06/28 1113) Weight:  [117.799 kg (259 lb 11.2 oz)] 117.799 kg (259 lb 11.2 oz) (06/28 1532)  Intake/Output from previous day: 06/28 0701 - 06/29 0700 In: 3721.7 [P.O.:720; I.V.:3001.7] Out: 4158 [Urine:4550; Drains:290; Blood:250] Intake/Output this shift: Total I/O In: 120 [P.O.:120] Out: -    Recent Labs  09/27/14 1149  HGB 10.8*    Recent Labs  09/27/14 1149  WBC 12.8*  RBC 3.68*  HCT 33.2*  PLT 199    Recent Labs  09/27/14 09/27/14 1149 09/28/14 0433  NA  --   --  138  K 4.0  --  3.3*  CL  --   --  103  CO2  --   --  30  BUN  --   --  8  CREATININE  --  0.67 0.60  GLUCOSE  --   --  95  CALCIUM  --   --  7.9*   No results for input(s): LABPT, INR in the last 72 hours.  EXAM General - Patient is Alert, Appropriate and Oriented Extremity - Neurovascular intact Sensation intact distally Intact pulses distally Dorsiflexion/Plantar flexion intact Dressing - dressing C/D/I and hemovac intact Motor Function - intact, moving foot and toes well on exam.   Past Medical History  Diagnosis Date  . Hypertension   . Hyperlipidemia   . Allergy   . GERD (gastroesophageal reflux disease)   . Arthritis   . Melanoma in situ     left shoulder  . Depression   . History of eating disorder   . Migraine   . Hypothyroidism   . PCOS (polycystic ovarian syndrome)   . Gout   . Lower extremity edema   .  Complication of anesthesia     nausea, slow to wake up    Assessment/Plan:   1 Day Post-Op Procedure(s) (LRB): TOTAL HIP ARTHROPLASTY ANTERIOR APPROACH (Left) Active Problems:   Primary localized osteoarthritis of left hip hypokalemia Estimated body mass index is 39.5 kg/(m^2) as calculated from the following:   Height as of this encounter: 5\' 8"  (1.727 m).   Weight as of this encounter: 117.799 kg (259 lb 11.2 oz). Advance diet Up with therapy  klor con 20 meq bid x 2 doses  DVT Prophylaxis - Lovenox, Foot Pumps and TED hose Weight-Bearing as tolerated to left leg D/C O2 and Pulse OX and try on Room Air  T. Rachelle Hora, PA-C [place] 09/28/2014, 8:13 AM

## 2014-09-28 NOTE — Progress Notes (Signed)
Physical Therapy Treatment Patient Details Name: Lori Le MRN: [id number] DOB: 1965-09-29 Today's Date: 09/28/2014    History of Present Illness This patient is a 49 year old female who came to [facility] for a L THR anterior approach.    PT Comments    Pt continues to be significantly limited with mobility due to significant L hip and L knee pain with movement/mobility.  Pt requiring increased time, rest breaks, and multiple attempts for mobility d/t this pain.  Ambulation very antalgic and limited to 6 feet with RW d/t c/o significant L hip and L knee pain (nursing notified).  Pt does not appear safe to discharge home.  Recommend pt discharge to STR when medically appropriate.  Follow Up Recommendations  SNF     Equipment Recommendations   (pt has borrowed RW; need to assess for appropriate fit)    Recommendations for Other Services       Precautions / Restrictions Precautions Precautions: Anterior Hip;Fall Precaution Booklet Issued: Yes (comment) Restrictions Weight Bearing Restrictions: Yes LLE Weight Bearing: Weight bearing as tolerated    Mobility  Bed Mobility Overal bed mobility: Needs Assistance Bed Mobility: Sit to Supine       Sit to supine: Min assist (assist for L LE)   General bed mobility comments: increased time to perform d/t L hip and knee pain; vc's required for technique  Transfers Overall transfer level: Needs assistance Equipment used: Rolling walker (2 wheeled) Transfers: Sit to/from [employer] Sit to Stand: Mod assist;Max assist Stand pivot transfers: Min assist;Mod assist (transfer recliner to commode)       General transfer comment: pt required vc's for hand and feet placement; multiple trials required to stand d/t significant L hip and L knee pain  Ambulation/Gait Ambulation/Gait assistance: Min assist;Mod assist Ambulation Distance (Feet): 6 Feet Assistive device: Rolling walker (2 wheeled)       General Gait  Details: antalgic, decreased stance time L LE, decreased cadence d/t L hip and knee pain; pt required vc's for stepping technique and to increase UE support through [employer]    Modified Rankin (Stroke Patients Only)       Balance                                    Cognition Arousal/Alertness: Awake/alert Behavior During Therapy: WFL for tasks assessed/performed Overall Cognitive Status: Within Functional Limits for tasks assessed                      Exercises      General Comments  Pt agreeable to PT session.      Pertinent Vitals/Pain Pain Assessment: 0-10 Pain Score: 10-Worst pain ever (5/10 end of session (nursing notified)) Pain Location: L hip and L knee Pain Descriptors / Indicators: Cramping;Sharp;Shooting;Spasm Pain Intervention(s): Limited activity within patient's tolerance;Monitored during session;Repositioned;Patient requesting pain meds-RN notified  Vitals stable and WFL throughout treatment session.     Home Living                      Prior Function            PT Goals (current goals can now be found in the care plan section) Acute Rehab PT Goals Patient Stated Goal: To have less pain PT Goal Formulation: With patient Time  For Goal Achievement: 10/12/14 Potential to Achieve Goals: Good Progress towards PT goals: Progressing toward goals    Frequency  BID    PT Plan Current plan remains appropriate    Co-evaluation             End of Session Equipment Utilized During Treatment: Gait belt Activity Tolerance: Patient limited by pain Patient left: in bed;with call bell/phone within reach;with bed alarm set;with SCD's reapplied (B heels elevated via pillows)     Time: 1332-1410 PT Time Calculation (min) (ACUTE ONLY): 38 min  Charges:  $Gait Training: 8-22 mins $Therapeutic Activity: 23-37 mins                    G CodesLeitha Bleak October 13, 2014, 4:56  PM [name], [place]

## 2014-09-28 NOTE — Clinical Social Work Placement (Signed)
   CLINICAL SOCIAL WORK PLACEMENT  NOTE  Date:  09/28/2014  Patient Details  Name: Lori Le MRN: [id number] Date of Birth: 1966/03/29  Clinical Social Work is seeking post-discharge placement for this patient at the [place] level of care (*CSW will initial, date and re-position this form in  chart as items are completed):  Yes   Patient/family provided with [facility] Work Department's list of facilities offering this level of care within the geographic area requested by the patient (or if unable, by the patient's family).  Yes   Patient/family informed of their freedom to choose among providers that offer the needed level of care, that participate in Medicare, Medicaid or managed care program needed by the patient, have an available bed and are willing to accept the patient.  Yes   Patient/family informed of [facility]'s ownership interest in [facility] and [facility] as well as of the fact that they are under no obligation to receive care at these facilities.  PASRR submitted to EDS on 09/28/14     PASRR number received on 09/28/14     Existing PASRR number confirmed on       FL2 transmitted to all facilities in geographic area requested by pt/family on 09/28/14     FL2 transmitted to all facilities within larger geographic area on       Patient informed that his/her managed care company has contracts with or will negotiate with certain facilities, including the following:            Patient/family informed of bed offers received.  Patient chooses bed at       Physician recommends and patient chooses bed at      Patient to be transferred to   on  .  Patient to be transferred to facility by       Patient family notified on   of transfer.  Name of family member notified:        PHYSICIAN Please sign FL2     Additional Comment:    _______________________________________________ Loralyn Freshwater, LCSW 09/28/2014,  3:29 PM

## 2014-09-29 LAB — BASIC METABOLIC PANEL
Anion gap: 12 (ref 5–15)
BUN: 7 mg/dL (ref 6–20)
CALCIUM: 7.9 mg/dL — AB (ref 8.9–10.3)
CHLORIDE: 96 mmol/L — AB (ref 101–111)
CO2: 30 mmol/L (ref 22–32)
CREATININE: 0.72 mg/dL (ref 0.44–1.00)
GFR calc Af Amer: >60 mL/min (ref >60)
GFR calc non Af Amer: >60 mL/min (ref >60)
Glucose, Bld: 176 mg/dL — ABNORMAL HIGH (ref 65–99)
POTASSIUM: 2.9 mmol/L — AB (ref 3.5–5.1)
Sodium: 138 mmol/L (ref 135–145)

## 2014-09-29 LAB — SURGICAL PATHOLOGY

## 2014-09-29 MED ORDER — POTASSIUM CHLORIDE CRYS ER 20 MEQ PO TBCR: 20.0000 meq | EXTENDED_RELEASE_TABLET | Freq: Four times a day (QID) | ORAL | Status: DC

## 2014-09-29 MED ORDER — POTASSIUM CHLORIDE CRYS ER 20 MEQ PO TBCR
20.0000 meq | EXTENDED_RELEASE_TABLET | Freq: Four times a day (QID) | ORAL | Status: AC
  Administered 2014-09-29 (×4): 20 meq via ORAL
  Filled 2014-09-29 (×4): qty 1

## 2014-09-29 NOTE — Progress Notes (Signed)
Physical Therapy Treatment Patient Details Name: Lori Le MRN: [id number] DOB: 1965/10/08 Today's Date: 09/29/2014    History of Present Illness This patient is a 49 year old female who came to [facility] for a L THR anterior approach.    PT Comments    Mobility deferred this afternoon d/t pt reporting increased soreness/pain after ambulating this morning and also feeling woozy and nauseas from recent pain medications.  Pt agreeable to LE ex's reclined in recliner and tolerated fairly well with assist for L LE and pacing.  Continue to recommend pt discharge to STR when medically appropriate.  Follow Up Recommendations  SNF     Equipment Recommendations       Recommendations for Other Services       Precautions / Restrictions Precautions Precautions: Anterior Hip;Fall Restrictions Weight Bearing Restrictions: Yes LLE Weight Bearing: Weight bearing as tolerated    Mobility  Bed Mobility               General bed mobility comments: Pt reporting being sore from this morning's PT session and also getting woozy/nauseas from recent pain medication and requesting ex's in the recliner (so mobility deferred).  Transfers                    Ambulation/Gait                 Stairs            Wheelchair Mobility    Modified Rankin (Stroke Patients Only)       Balance                                    Cognition Arousal/Alertness: Awake/alert Behavior During Therapy: WFL for tasks assessed/performed Overall Cognitive Status: Within Functional Limits for tasks assessed                      Exercises   Performed semi-supine (reclined in recliner) B LE therapeutic exercise x 20 reps:  Ankle pumps (AROM B LE's); quad sets x3 second holds (AROM B LE's); glute squeezes x3 second holds (AROM B); hip aDduction isometrics (pillow between pt's knees) x3 second holds; SAQ's (AROM R; AAROM L); heelslides (AROM R; AAROM L), hip  abd/adduction (AROM R; AAROM L), and SLR (AROM R; AAROM L).  Pt required vc's and tactile cues for correct technique with exercises.     General Comments  Nursing cleared pt for participation in PT and pt agreeable to ex's reclined in recliner.      Pertinent Vitals/Pain Pain Assessment: 0-10 Pain Score: 6  Pain Location: L hip and L knee Pain Descriptors / Indicators: Sharp;Shooting;Sore Pain Intervention(s): Limited activity within patient's tolerance;Monitored during session;Premedicated before session;Repositioned  Vitals stable and WFL throughout treatment session.     Home Living                      Prior Function            PT Goals (current goals can now be found in the care plan section) Acute Rehab PT Goals Patient Stated Goal: To have less pain PT Goal Formulation: With patient Time For Goal Achievement: 10/12/14 Potential to Achieve Goals: Good Progress towards PT goals: Progressing toward goals    Frequency  BID    PT Plan Current plan remains appropriate    Co-evaluation  End of Session   Activity Tolerance: Patient limited by pain;Patient limited by fatigue Patient left: in chair;with call bell/phone within reach;with chair alarm set;with SCD's reapplied (B heels elevated via pillows)     Time: 1455-1530 PT Time Calculation (min) (ACUTE ONLY): 35 min  Charges:  $Therapeutic Exercise: 23-37 mins                    G CodesLeitha Bleak 10-18-14, 4:46 PM [name], [place]

## 2014-09-29 NOTE — Progress Notes (Signed)
Lab called with a critical value of potasium 2.9. Dr Rudene Christians notified, and ordered 20 meq QID. Recheck potasium in the am.

## 2014-09-29 NOTE — Progress Notes (Signed)
Physical Therapy Treatment Patient Details Name: Lori Le MRN: [id number] DOB: March 19, 1966 Today's Date: 09/29/2014    History of Present Illness This patient is a 49 year old female who came to [facility] for a L THR anterior approach.    PT Comments    Pt slowly progressing with functional mobility d/t c/o significant L hip and L knee pain with activity and ambulated 16 feet with RW today.  Pt with L knee flexed and on L forefoot in order to decrease L knee pain with ambulation.  Pt reporting taking pain medications pre-therapy but was nauseas post therapy requiring emesis bag and cool wash-cloth to forehead (nursing notified immediately).  Continue to recommend pt discharge to STR when medically appropriate.  Follow Up Recommendations  SNF     Equipment Recommendations       Recommendations for Other Services       Precautions / Restrictions Precautions Precautions: Anterior Hip;Fall Restrictions Weight Bearing Restrictions: Yes LLE Weight Bearing: Weight bearing as tolerated    Mobility  Bed Mobility               General bed mobility comments: Deferred d/t pt already sitting up in bedside chair  Transfers Overall transfer level: Needs assistance Equipment used: Rolling walker (2 wheeled) Transfers: Sit to/from Stand Sit to Stand: Mod assist         General transfer comment: pt required vc's for hand and feet placement; increased effort and c/o pain d/t L hip and knee pain  Ambulation/Gait Ambulation/Gait assistance: Min guard;Min assist Ambulation Distance (Feet): 16 Feet Assistive device: Rolling walker (2 wheeled)       General Gait Details: antalgic; pt requiring assist to advance L LE for first 10 feet (then pt able to perform on own with increased effort); significant decreased cadence d/t L hip and knee pain and required multiple standing rest breaks; initial vc's required for stepping technique and to increase UE support through [employer] Mobility    Modified Rankin (Stroke Patients Only)       Balance                                    Cognition Arousal/Alertness: Awake/alert Behavior During Therapy: WFL for tasks assessed/performed Overall Cognitive Status: Within Functional Limits for tasks assessed                      Exercises   Performed sitting exercises x 10 reps B LE's:  LAQ's (AROM R; AAROM L); marching/hip flexion (AROM R; AAROM L).  Pt required vc's and tactile cues for correct technique with exercises.     General Comments  Nursing cleared pt for participation in physical therapy and pt agreeable to PT session.  After PT session, nursing notified immediately regarding pt's nausea and nursing reporting she just received phone call regarding pt's critical potassium of 2.9 and was going in to check on pt.      Pertinent Vitals/Pain Pain Assessment: 0-10 Pain Score: 10-Worst pain ever (4/10 end of session) Pain Location: L hip and L knee Pain Descriptors / Indicators: Sharp;Shooting Pain Intervention(s): Limited activity within patient's tolerance;Monitored during session;Premedicated before session;Repositioned  HR 83-89 bpm and O2 >91% on room air during session.    Home Living  Prior Function            PT Goals (current goals can now be found in the care plan section) Acute Rehab PT Goals Patient Stated Goal: To have less pain PT Goal Formulation: With patient Time For Goal Achievement: 10/12/14 Potential to Achieve Goals: Good Progress towards PT goals: Progressing toward goals    Frequency  BID    PT Plan Current plan remains appropriate    Co-evaluation             End of Session Equipment Utilized During Treatment: Gait belt Activity Tolerance: Patient limited by pain Patient left: in chair;with call bell/phone within reach;with chair alarm set;with family/visitor present;with SCD's reapplied  (B heels elevated via pillows; pt's husband in room)     Time: 0131-4388 PT Time Calculation (min) (ACUTE ONLY): 33 min  Charges:  $Gait Training: 8-22 mins $Therapeutic Exercise: 8-22 mins                    G CodesLeitha Le 2014/10/19, 12:13 PM Lori Le, Lori Le

## 2014-09-29 NOTE — Progress Notes (Signed)
OT Cancellation Note  Patient Details Name: Lori Le MRN: [id number] DOB: 05-16-1965   Cancelled Treatment:     Attempted Occupational Therapy however, patient's potassium is 2.9. Physical Therapy did only light exercise in recliner. Will hold Occupational Therapy   Myrene Galas, MS/OTR/L  09/29/2014, 3:38 PM

## 2014-09-29 NOTE — Progress Notes (Signed)
  Subjective: 2 Days Post-Op Procedure(s) (LRB): TOTAL HIP ARTHROPLASTY ANTERIOR APPROACH (Left) Patient reports pain as mild.   Patient is well, but has had some minor complaints of pain and cramping. Plan is to go Skilled nursing facility after hospital stay. Negative for chest pain and shortness of breath Fever: no Gastrointestinal:Negative for nausea and vomiting  Objective: Vital signs in last 24 hours: Temp:  [98 F (36.7 C)-99.2 F (37.3 C)] 98 F (36.7 C) (06/30 0441) Pulse Rate:  [72-94] 94 (06/30 0441) Resp:  [16-18] 18 (06/30 0026) BP: (117-136)/(43-69) 128/54 mmHg (06/30 0441) SpO2:  [98 %-100 %] 98 % (06/30 0441)  Intake/Output from previous day:  Intake/Output Summary (Last 24 hours) at 09/29/14 0718 Last data filed at 09/29/14 0529  Gross per 24 hour  Intake 1598.33 ml  Output   5675 ml  Net -4076.67 ml    Intake/Output this shift:    Labs:  Recent Labs  09/27/14 1149  HGB 10.8*    Recent Labs  09/27/14 1149  WBC 12.8*  RBC 3.68*  HCT 33.2*  PLT 199    Recent Labs  09/27/14 09/27/14 1149 09/28/14 0433  NA  --   --  138  K 4.0  --  3.3*  CL  --   --  103  CO2  --   --  30  BUN  --   --  8  CREATININE  --  0.67 0.60  GLUCOSE  --   --  95  CALCIUM  --   --  7.9*   No results for input(s): LABPT, INR in the last 72 hours.   EXAM General - Patient is Alert, Appropriate and Oriented Extremity - Neurologically intact ABD soft Neurovascular intact Sensation intact distally Dorsiflexion/Plantar flexion intact Incision: scant drainage Dressing/Incision - blood tinged drainage.  Dressing changed this AM to [place]. Motor Function - intact, moving foot and toes well on exam.  Past Medical History  Diagnosis Date  . Hypertension   . Hyperlipidemia   . Allergy   . GERD (gastroesophageal reflux disease)   . Arthritis   . Melanoma in situ     left shoulder  . Depression   . History of eating disorder   . Migraine   .  Hypothyroidism   . PCOS (polycystic ovarian syndrome)   . Gout   . Lower extremity edema   . Complication of anesthesia     nausea, slow to wake up    Assessment/Plan: 2 Days Post-Op Procedure(s) (LRB): TOTAL HIP ARTHROPLASTY ANTERIOR APPROACH (Left) Active Problems:   Primary localized osteoarthritis of left hip  Estimated body mass index is 39.5 kg/(m^2) as calculated from the following:   Height as of this encounter: 5\' 8"  (1.727 m).   Weight as of this encounter: 117.799 kg (259 lb 11.2 oz). Advance diet Up with therapy Plan for discharge tomorrow  Continues to have pain with PT, was only able to walk 6 steps yesterday with multiple brakes.  PT recommends SNF at this point in time. Foley was D/C on POD 1 Hemovac D/C POD2 and dressing changed today. She has not had a BM yet.  K+ 3.3 yesterday.  Given Klor-con, will re-check BMP today.  DVT Prophylaxis - Lovenox, Foot Pumps and TED hose Weight-Bearing as tolerated to left leg  J. Cameron Proud, PA-C [facility] 09/29/2014, 7:18 AM

## 2014-09-29 NOTE — Progress Notes (Signed)
Clinical Education officer, museum (CSW) received call back late yesterday from patient. Patient chose [facility] CSW contacted [facility] admissions coordinator at [place] and made her aware of above. PT is continuing to recommend SNF today. Per PT patient walked 16 feet. Plan is for patient to D/C to [facility] Friday. CSW will continue to follow and assist as needed.   Blima Rich, [place] [phone]

## 2014-09-30 ENCOUNTER — Other Ambulatory Visit: Payer: Self-pay

## 2014-09-30 LAB — POTASSIUM: POTASSIUM: 3.6 mmol/L (ref 3.5–5.1)

## 2014-09-30 MED ORDER — ENOXAPARIN SODIUM 40 MG/0.4ML ~~LOC~~ SOLN: 40.0000 mg | SUBCUTANEOUS | Status: DC

## 2014-09-30 MED ORDER — METHOCARBAMOL 500 MG PO TABS: 500.0000 mg | ORAL_TABLET | Freq: Four times a day (QID) | ORAL | Status: DC

## 2014-09-30 MED ORDER — OXYCODONE HCL 5 MG PO TABS: ORAL_TABLET | ORAL | Status: DC

## 2014-09-30 MED ORDER — OXYCODONE HCL 5 MG PO TABS: 5.0000 mg | ORAL_TABLET | ORAL | Status: DC | PRN

## 2014-09-30 NOTE — Clinical Social Work Placement (Signed)
   CLINICAL SOCIAL WORK PLACEMENT  NOTE  Date:  09/30/2014  Patient Details  Name: Lori Le MRN: [id number] Date of Birth: Dec 09, 1965  Clinical Social Work is seeking post-discharge placement for this patient at the [place] level of care (*CSW will initial, date and re-position this form in  chart as items are completed):  Yes   Patient/family provided with [place] Work Department's list of facilities offering this level of care within the geographic area requested by the patient (or if unable, by the patient's family).  Yes   Patient/family informed of their freedom to choose among providers that offer the needed level of care, that participate in Medicare, Medicaid or managed care program needed by the patient, have an available bed and are willing to accept the patient.  Yes   Patient/family informed of [facility]'s ownership interest in [facility] and [facility], as well as of the fact that they are under no obligation to receive care at these facilities.  PASRR submitted to EDS on 09/28/14     PASRR number received on 09/28/14     Existing PASRR number confirmed on       FL2 transmitted to all facilities in geographic area requested by pt/family on 09/28/14     FL2 transmitted to all facilities within larger geographic area on       Patient informed that his/her managed care company has contracts with or will negotiate with certain facilities, including the following:        Yes   Patient/family informed of bed offers received.  Patient chooses bed at  [facility] )     Physician recommends and patient chooses bed at      Patient to be transferred to  [facility] ) on 09/30/14.  Patient to be transferred to facility by  [facility] )     Patient family notified on 09/30/14 of transfer.  Name of family member notified:   (Patient's husband is at bedside. )     PHYSICIAN       Additional Comment:     _______________________________________________ Loralyn Freshwater, LCSW 09/30/2014, 10:23 AM

## 2014-09-30 NOTE — Progress Notes (Signed)
Physical Therapy Treatment Patient Details Name: Lori Le MRN: [id number] DOB: Nov 04, 1965 Today's Date: 09/30/2014    History of Present Illness This patient is a 49 year old female who came to [facility] for a L THR anterior approach.    PT Comments    Pt progressing with functional mobility today and was a min to mod assist to stand with RW and ambulated 35 feet with RW CGA (limited distance d/t L hip/knee pain and feeling "woozy" from pain medication).  Plan to discharge to STR today.  Follow Up Recommendations  SNF     Equipment Recommendations       Recommendations for Other Services       Precautions / Restrictions Precautions Precautions: Anterior Hip;Fall Restrictions Weight Bearing Restrictions: Yes LLE Weight Bearing: Weight bearing as tolerated    Mobility  Bed Mobility Overal bed mobility: Needs Assistance Bed Mobility: Supine to Sit     Supine to sit: Min assist;HOB elevated     General bed mobility comments: assist for L LE; increased time to perform d/t L LE pain  Transfers Overall transfer level: Needs assistance Equipment used: Rolling walker (2 wheeled) Transfers: Sit to/from Stand Sit to Stand: Min assist;Mod assist         General transfer comment: pt required vc's for hand and feet placement; increased effort and c/o pain d/t L hip and knee pain  Ambulation/Gait Ambulation/Gait assistance: Min guard Ambulation Distance (Feet): 35 Feet Assistive device: Rolling walker (2 wheeled)       General Gait Details: antalgic; pt able to advance L LE on own (although short stepping distance and with increased effort); significant decreased cadence d/t L hip and knee pain and required multiple standing rest breaks; L knee flexed and pt WB'ing on L forefoot to decreased L knee pain   Stairs            Wheelchair Mobility    Modified Rankin (Stroke Patients Only)       Balance                                     Cognition Arousal/Alertness: Awake/alert Behavior During Therapy: WFL for tasks assessed/performed Overall Cognitive Status: Within Functional Limits for tasks assessed                      Exercises      General Comments  Nursing cleared pt for participation in PT and pre-medicated pt for therapy session.  Pt agreeable to PT session.      Pertinent Vitals/Pain Pain Assessment: 0-10 Pain Score: 6  Pain Location: L hip and L knee Pain Descriptors / Indicators: Sharp;Shooting;Sore Pain Intervention(s): Limited activity within patient's tolerance;Monitored during session;Premedicated before session;Repositioned (Nursing aide came to give ice pack to pt)  HR 83 bpm at rest but increased to 117 bpm after ambulation (HR decreased back to 80's with sitting rest break). O2 on room air >97% during session.    Home Living                      Prior Function            PT Goals (current goals can now be found in the care plan section) Acute Rehab PT Goals Patient Stated Goal: To have less pain PT Goal Formulation: With patient Time For Goal Achievement: 10/12/14 Potential to Achieve Goals: Good Progress  towards PT goals: Progressing toward goals    Frequency  BID    PT Plan Current plan remains appropriate    Co-evaluation             End of Session Equipment Utilized During Treatment: Gait belt Activity Tolerance: Patient limited by pain Patient left: in chair;with call bell/phone within reach;with chair alarm set;with family/visitor present;with SCD's reapplied (B heels elevated via pillows)     Time: 4888-9169 PT Time Calculation (min) (ACUTE ONLY): 25 min  Charges:  $Gait Training: 8-22 mins $Therapeutic Activity: 8-22 mins                    G CodesLeitha Bleak Oct 14, 2014, 11:37 AM [name], [place]

## 2014-09-30 NOTE — Progress Notes (Signed)
   Subjective: 3 Days Post-Op Procedure(s) (LRB): TOTAL HIP ARTHROPLASTY ANTERIOR APPROACH (Left) Patient reports pain as mild.   Patient is well, but has had some minor complaints of muscle spasms left anterior thigh We will start therapy today.  Plan is to go Rehab after hospital stay.  Objective: Vital signs in last 24 hours: Temp:  [97.9 F (36.6 C)-99.1 F (37.3 C)] 97.9 F (36.6 C) (07/01 0743) Pulse Rate:  [73-86] 77 (07/01 0743) Resp:  [16-18] 17 (07/01 0743) BP: (116-127)/(49-62) 127/55 mmHg (07/01 0743) SpO2:  [99 %-100 %] 99 % (07/01 0743)  Intake/Output from previous day: 06/30 0701 - 07/01 0700 In: 720 [P.O.:720] Out: 2600 [Urine:2600] Intake/Output this shift:     Recent Labs  09/27/14 1149  HGB 10.8*    Recent Labs  09/27/14 1149  WBC 12.8*  RBC 3.68*  HCT 33.2*  PLT 199    Recent Labs  09/28/14 0433 09/29/14 0915 09/30/14 0617  NA 138 138  --   K 3.3* 2.9* 3.6  CL 103 96*  --   CO2 30 30  --   BUN 8 7  --   CREATININE 0.60 0.72  --   GLUCOSE 95 176*  --   CALCIUM 7.9* 7.9*  --    No results for input(s): LABPT, INR in the last 72 hours.  EXAM General - Patient is Alert, Appropriate and Oriented Extremity - Neurologically intact Neurovascular intact Sensation intact distally Intact pulses distally Dorsiflexion/Plantar flexion intact No cellulitis present Dressing - dressing C/D/I and scant drainage Motor Function - intact, moving foot and toes well on exam.   Past Medical History  Diagnosis Date  . Hypertension   . Hyperlipidemia   . Allergy   . GERD (gastroesophageal reflux disease)   . Arthritis   . Melanoma in situ     left shoulder  . Depression   . History of eating disorder   . Migraine   . Hypothyroidism   . PCOS (polycystic ovarian syndrome)   . Gout   . Lower extremity edema   . Complication of anesthesia     nausea, slow to wake up    Assessment/Plan:   3 Days Post-Op Procedure(s) (LRB): TOTAL HIP  ARTHROPLASTY ANTERIOR APPROACH (Left) Active Problems:   Primary localized osteoarthritis of left hip  Estimated body mass index is 39.5 kg/(m^2) as calculated from the following:   Height as of this encounter: 5\' 8"  (1.727 m).   Weight as of this encounter: 117.799 kg (259 lb 11.2 oz). Up with therapy Discharge to SNF  Follow up with [facility] ortho in 2 weeks  DVT Prophylaxis - Lovenox, TEDS, Foota pumps Weight-Bearing as tolerated to left leg D/C O2 and Pulse OX and try on Room Air  T. Rachelle Hora, PA-C [place] 09/30/2014, 7:57 AM

## 2014-09-30 NOTE — Telephone Encounter (Signed)
New patient at [facility] request hard script for patient. Fax back to [employer] [phone]

## 2014-09-30 NOTE — Discharge Summary (Signed)
Physician Discharge Summary  Patient ID: Lori Le MRN: [id number] DOB/AGE: 49-Feb-1967 49 y.o.  Admit date: 09/27/2014 Discharge date: 09/30/2014  Admission Diagnoses:  OSTEOARTHRITIS left hip   Discharge Diagnoses: Patient Active Problem List   Diagnosis Date Noted  . Primary localized osteoarthritis of left hip 09/27/2014  . Yeast infection 07/31/2014  . Allergic rhinitis 07/31/2014  . Obesity (BMI 30-39.9) 03/20/2014  . Gout 03/20/2014  . Joint stiffness 03/26/2013  . Fatigue 12/30/2012  . Edema 12/30/2012  . Essential hypertension, benign 08/02/2012  . Melanoma in situ 08/02/2012  . Depression 08/02/2012  . Environmental allergies 08/02/2012  . Hypercholesterolemia 08/02/2012  . Migraines 08/02/2012  . Hypothyroidism 08/02/2012  . PCOS (polycystic ovarian syndrome) 08/02/2012  . GERD (gastroesophageal reflux disease) 08/02/2012  . H/O gastric bypass 08/02/2012    Past Medical History  Diagnosis Date  . Hypertension   . Hyperlipidemia   . Allergy   . GERD (gastroesophageal reflux disease)   . Arthritis   . Melanoma in situ     left shoulder  . Depression   . History of eating disorder   . Migraine   . Hypothyroidism   . PCOS (polycystic ovarian syndrome)   . Gout   . Lower extremity edema   . Complication of anesthesia     nausea, slow to wake up     Transfusion: none   Consultants (if any):  none  Discharged Condition: Improved  Hospital Course: Lori Le is an 49 y.o. female who was admitted 09/27/2014 with a diagnosis of left hip OA and went to the operating room on 09/27/2014 and underwent the above named procedures.    Surgeries: Procedure(s): TOTAL HIP ARTHROPLASTY ANTERIOR APPROACH on 09/27/2014 Patient tolerated the surgery well. Taken to PACU where she was stabilized and then transferred to the orthopedic floor.  Started on Lovenox 30 q 12 hrs. Foot pumps applied bilaterally at 80 mm. Heels elevated on bed with rolled towels. No  evidence of DVT. Negative Homan. Physical therapy started on day #1 for gait training and transfer. OT started day #1 for ADL and assisted devices.  Patient's foley was d/c on day #1. Patient's IV and hemovac was d/c on day #2.  On post op day #3 patient was stable and ready for discharge to SNF.  Implants: Medacta Mpact 56 mm DM cup with liner, 4 AMIS stem And M 28 mm head  She was given perioperative antibiotics:  Anti-infectives    Start     Dose/Rate Route Frequency Ordered Stop   09/27/14 1115  ceFAZolin (ANCEF) IVPB 2 g/50 mL premix     2 g 100 mL/hr over 30 Minutes Intravenous Every 6 hours 09/27/14 1112 09/27/14 1803   09/27/14 0802  gentamicin (GARAMYCIN) 80 mg in sodium chloride irrigation 0.9 % 500 mL irrigation  Status:  Discontinued       As needed 09/27/14 0802 09/27/14 0935   09/27/14 0615  ceFAZolin (ANCEF) IVPB 2 g/50 mL premix     2 g 100 mL/hr over 30 Minutes Intravenous  Once 09/27/14 0605 09/27/14 0751   09/27/14 0559  ceFAZolin (ANCEF) 2-3 GM-% IVPB SOLR    Comments:  Veda Canning: cabinet override      09/27/14 0559 09/27/14 1759    .  She was given sequential compression devices, early ambulation, and lovenox for DVT prophylaxis.  She benefited maximally from the hospital stay and there were no complications.    Recent vital signs:  Filed Vitals:  09/30/14 0743  BP: 127/55  Pulse: 77  Temp: 97.9 F (36.6 C)  Resp: 17    Recent laboratory studies:  Lab Results  Component Value Date   HGB 10.8* 09/27/2014   HGB 13.7 09/14/2014   HGB 13.6 05/17/2014   Lab Results  Component Value Date   WBC 12.8* 09/27/2014   PLT 199 09/27/2014   Lab Results  Component Value Date   INR 0.91 09/14/2014   Lab Results  Component Value Date   NA 138 09/29/2014   K 3.6 09/30/2014   CL 96* 09/29/2014   CO2 30 09/29/2014   BUN 7 09/29/2014   CREATININE 0.72 09/29/2014   GLUCOSE 176* 09/29/2014    Discharge Medications:     Medication List     TAKE these medications        allopurinol 300 MG tablet  Commonly known as:  ZYLOPRIM  TAKE ONE TABLET BY MOUTH ONE TIME DAILY     cetirizine 10 MG tablet  Commonly known as:  ZYRTEC  Take 10 mg by mouth daily.     colchicine 0.6 MG tablet  Take 1 tablet (0.6 mg total) by mouth 2 (two) times daily.     enoxaparin 40 MG/0.4ML injection  Commonly known as:  LOVENOX  Inject 0.4 mLs (40 mg total) into the skin daily.     furosemide 40 MG tablet  Commonly known as:  LASIX  TAKE ONE TABLET BY MOUTH ONE TIME DAILY     levothyroxine 137 MCG tablet  Commonly known as:  SYNTHROID  Take 1 tablet (137 mcg total) by mouth daily before breakfast.     medroxyPROGESTERone 10 MG tablet  Commonly known as:  PROVERA  Take 1 tablet (10 mg total) by mouth daily.     methocarbamol 500 MG tablet  Commonly known as:  ROBAXIN  Take 1 tablet (500 mg total) by mouth every 6 (six) hours.     omeprazole 20 MG capsule  Commonly known as:  PRILOSEC  Take 1 capsule (20 mg total) by mouth daily.     oxyCODONE 5 MG immediate release tablet  Commonly known as:  Oxy IR/ROXICODONE  Take 1-2 tablets (5-10 mg total) by mouth every 3 (three) hours as needed for breakthrough pain.     Potassium 99 MG Tabs  Take 1 tablet by mouth daily. This is OTC medication.     triamcinolone 55 MCG/ACT Aero nasal inhaler  Commonly known as:  NASACORT  Place 2 sprays into the nose daily.     triamterene-hydrochlorothiazide 37.5-25 MG per tablet  Commonly known as:  MAXZIDE-25  Take 1 tablet by mouth daily.        Diagnostic Studies: Dg Hip Operative Unilat With Pelvis Left  09/27/2014   CLINICAL DATA:  Left hip arthritis.  EXAM: OPERATIVE left HIP (WITH PELVIS IF PERFORMED) 1 VIEWS  TECHNIQUE: Fluoroscopic spot image(s) were submitted for interpretation post-operatively.  FLUOROSCOPY TIME:  Fluoroscopy Time:  28 seconds.  Number of Acquired Images:  1.  COMPARISON:  None.  FINDINGS: The patient is status post left  total hip arthroplasty. The visualized portions of the acetabular and femoral components appear to be well situated. No dislocation is noted.  IMPRESSION: Status post left total hip arthroplasty.   Electronically Signed   By: Marijo Conception, M.D.   On: 09/27/2014 09:31   Dg Hip Unilat With Pelvis 2-3 Views Left  09/27/2014   CLINICAL DATA:  Status post left hip replacement  EXAM:  LEFT HIP (WITH PELVIS) 2-3 VIEWS  COMPARISON:  None.  FINDINGS: A left hip arthroplasty is noted in satisfactory position. No acute bony or soft tissue abnormality is seen.  IMPRESSION: Status post left hip arthroplasty.   Electronically Signed   By: Inez Catalina M.D.   On: 09/27/2014 10:32    Disposition: Discharge to SNF facility today. Patient medically stable.        Follow-up Information    Follow up with MENZ,MICHAEL, MD In 2 weeks.   Specialty:  Orthopedic Surgery   Why:  For staple removal and skin check   Contact information:   [place] [address] [phone]        Signed: Feliberto Gottron 09/30/2014, 8:14 AM

## 2014-09-30 NOTE — Progress Notes (Signed)
Patient is medically stable for D/C to [facility] today. Per [facility] admissions coordinator at [facility] patient is going to room 702. RN will call report at [facility] and arrange EMS for transport. Clinical Education officer, museum (CSW) prepared D/C packet and sent D/C Summary to [facility] [employer] [facility] via carefinder. [facility] employee came to bedside and completed paper work with patient. Patient's husband is at bedside and aware of above. Please reconsult if future social work needs arise. CSW signing off.   Blima Rich, [place] [phone]

## 2014-09-30 NOTE — Discharge Instructions (Signed)

## 2014-10-04 ENCOUNTER — Other Ambulatory Visit: Payer: Self-pay

## 2014-10-04 MED ORDER — OXYCODONE HCL 5 MG PO TABS: ORAL_TABLET | ORAL | Status: DC

## 2014-10-04 NOTE — Telephone Encounter (Signed)
Rx faxed to [facility] @ [phone], phone number [phone]  

## 2014-10-06 ENCOUNTER — Non-Acute Institutional Stay (SKILLED_NURSING_FACILITY): Payer: 59 | Admitting: Internal Medicine

## 2014-10-06 DIAGNOSIS — J302 Other seasonal allergic rhinitis: Secondary | ICD-10-CM | POA: Diagnosis not present

## 2014-10-06 DIAGNOSIS — D62 Acute posthemorrhagic anemia: Secondary | ICD-10-CM

## 2014-10-06 DIAGNOSIS — I10 Essential (primary) hypertension: Secondary | ICD-10-CM

## 2014-10-06 DIAGNOSIS — K219 Gastro-esophageal reflux disease without esophagitis: Secondary | ICD-10-CM | POA: Diagnosis not present

## 2014-10-06 DIAGNOSIS — D72829 Elevated white blood cell count, unspecified: Secondary | ICD-10-CM

## 2014-10-06 DIAGNOSIS — E039 Hypothyroidism, unspecified: Secondary | ICD-10-CM | POA: Diagnosis not present

## 2014-10-06 DIAGNOSIS — M1A9XX0 Chronic gout, unspecified, without tophus (tophi): Secondary | ICD-10-CM | POA: Diagnosis not present

## 2014-10-06 DIAGNOSIS — M1612 Unilateral primary osteoarthritis, left hip: Secondary | ICD-10-CM

## 2014-10-06 DIAGNOSIS — K5900 Constipation, unspecified: Secondary | ICD-10-CM

## 2014-10-07 NOTE — Progress Notes (Signed)
Patient ID: Lori Le, female   DOB: 11-03-1965, 49 y.o.   MRN: [id number]      Facility: [facility]    PCP: Einar Pheasant, MD  Code Status: full code  Allergies  Allergen Reactions  . Influenza Vaccines Hives  . Lactose Intolerance (Gi) Nausea And Vomiting    Can have yogurt Can not have milk and ice cream    . Neosporin + Pain Relief Max St [Neomy-Bacit-Polymyx-Pramoxine] Rash    Chief Complaint  Patient presents with  . New Admit To SNF     HPI:  49 y.o. year old patient is here for short term rehabilitation post hospital admission from 09/27/14-09/30/14 with left hip OA. She underwent total hip arthroplasty anterior approach. She is seen in her room today. Her pain is under control with current regimen but she feels it is strong for her and would like to step down on pain pills. She does not want to take oxyIR as it is making her nauseous and dizzy at times. She has been straining with her bowel movement. Has been working with therapy team.  Review of Systems:  Constitutional: Negative for fever, chills, diaphoresis.  HENT: Negative for headache, congestion, nasal discharge Eyes: Negative for eye pain, blurred vision, double vision and discharge.  Respiratory: Negative for cough, shortness of breath and wheezing.   Cardiovascular: Negative for chest pain, palpitations, leg swelling.  Gastrointestinal: Negative for heartburn, nausea, vomiting, abdominal pain. Last bowel movement today with straining Genitourinary: Negative for dysuria  Musculoskeletal: Negative for back pain, falls Skin: Negative for itching, sores and rash.  Neurological: Negative for tingling, focal weakness Psychiatric/Behavioral: Negative for depression  Past Medical History  Diagnosis Date  . Hypertension   . Hyperlipidemia   . Allergy   . GERD (gastroesophageal reflux disease)   . Arthritis   . Melanoma in situ     left shoulder  . Depression   . History of  eating disorder   . Migraine   . Hypothyroidism   . PCOS (polycystic ovarian syndrome)   . Gout   . Lower extremity edema   . Complication of anesthesia     nausea, slow to wake up   Past Surgical History  Procedure Laterality Date  . Cholecystectomy  2003  . Foot surgery  1994  . Gastric bypass  2011  . Laparoscopic gastric banding  2010  . Knee arthroscopy Right   . Total hip arthroplasty Left 09/27/2014    Procedure: TOTAL HIP ARTHROPLASTY ANTERIOR APPROACH;  Surgeon: Hessie Knows, MD;  Location: [facility] ORS;  Service: Orthopedics;  Laterality: Left;   Social History:   reports that she has never smoked. She has never used smokeless tobacco. She reports that she drinks alcohol. She reports that she does not use illicit drugs.  Family History  Problem Relation Age of Onset  . Arthritis Mother   . Hyperlipidemia Mother   . Hypertension Mother   . Diabetes Mother   . Arthritis Father   . Hyperlipidemia Father   . Hypertension Father   . Mental illness Father   . Diabetes Father   . Arthritis Maternal Grandmother   . Cancer Maternal Grandmother     breast cancer  . Hyperlipidemia Maternal Grandmother   . Hypertension Maternal Grandmother   . Arthritis Maternal Grandfather   . Hyperlipidemia Maternal Grandfather   . Hypertension Maternal Grandfather   . Heart disease Maternal Grandfather     heart attack  . Breast cancer  maternal and paternal grandmother    Medications: Patient's Medications  New Prescriptions   No medications on file  Previous Medications   ALLOPURINOL (ZYLOPRIM) 300 MG TABLET    TAKE ONE TABLET BY MOUTH ONE TIME DAILY    CETIRIZINE (ZYRTEC) 10 MG TABLET    Take 10 mg by mouth daily.   COLCHICINE 0.6 MG TABLET    Take 1 tablet (0.6 mg total) by mouth 2 (two) times daily.   ENOXAPARIN (LOVENOX) 40 MG/0.4ML INJECTION    Inject 0.4 mLs (40 mg total) into the skin daily.   FUROSEMIDE (LASIX) 40 MG TABLET    TAKE ONE TABLET BY MOUTH ONE TIME DAILY      LEVOTHYROXINE (SYNTHROID) 137 MCG TABLET    Take 1 tablet (137 mcg total) by mouth daily before breakfast.   MEDROXYPROGESTERONE (PROVERA) 10 MG TABLET    Take 1 tablet (10 mg total) by mouth daily.   METHOCARBAMOL (ROBAXIN) 500 MG TABLET    Take 1 tablet (500 mg total) by mouth every 6 (six) hours.   OMEPRAZOLE (PRILOSEC) 20 MG CAPSULE    Take 1 capsule (20 mg total) by mouth daily.   OXYCODONE (OXY IR/ROXICODONE) 5 MG IMMEDIATE RELEASE TABLET    Take one tablet by mouth every 3 hours as needed for moderate pain, take 2 tablets by mouth every 3 hours for severe pain   POTASSIUM 99 MG TABS    Take 1 tablet by mouth daily. This is OTC medication.   TRIAMCINOLONE (NASACORT) 55 MCG/ACT AERO NASAL INHALER    Place 2 sprays into the nose daily.   TRIAMTERENE-HYDROCHLOROTHIAZIDE (MAXZIDE-25) 37.5-25 MG PER TABLET    Take 1 tablet by mouth daily.  Modified Medications   No medications on file  Discontinued Medications   No medications on file     Physical Exam: Filed Vitals:   10/06/14 1653  BP: 124/60  Pulse: 54  Temp: 97.6 F (36.4 C)  Resp: 18  SpO2: 95%    General-adult female, in no acute distress Head- normocephalic, atraumatic Throat- moist mucus membrane Cardiovascular- normal s1,s2, no murmurs, palpable dorsalis pedis and radial pulses, trace leg edema Respiratory- bilateral clear to auscultation, no wheeze, no rhonchi, no crackles, no use of accessory muscles Abdomen- bowel sounds present, soft, non tender Musculoskeletal- able to move all 4 extremities, limited left leg range of motion  Neurological- no focal deficit Skin- warm and dry, left hip surgical incision with honeycomb dressing Psychiatry- normal mood and affect    Labs reviewed: Basic Metabolic Panel:  Recent Labs  09/14/14 0845  09/27/14 1149 09/28/14 0433 09/29/14 0915 09/30/14 0617  NA 140  --   --  138 138  --   K 2.9*  < >  --  3.3* 2.9* 3.6  CL 98*  --   --  103 96*  --   CO2 32  --   --  30  30  --   GLUCOSE 75  --   --  95 176*  --   BUN 20  --   --  8 7  --   CREATININE 0.67  --  0.67 0.60 0.72  --   CALCIUM 9.0  --   --  7.9* 7.9*  --   < > = values in this interval not displayed. Liver Function Tests:  Recent Labs  12/20/13 0801 05/17/14 1127  AST 11 9  ALT 17 14  ALKPHOS 45 53  BILITOT 0.8 0.4  PROT 6.8 6.5  ALBUMIN 3.8 3.9   No results for input(s): LIPASE, AMYLASE in the last 8760 hours. No results for input(s): AMMONIA in the last 8760 hours. CBC:  Recent Labs  10/13/13 1552 12/20/13 0801 05/17/14 1127 09/14/14 0845 09/27/14 1149  WBC 9.3 7.0 9.2 7.5 12.8*  NEUTROABS 6.4 4.5 6.7  --   --   HGB 12.7 14.1 13.6 13.7 10.8*  HCT 40.1 42.3 40.9 42.5 33.2*  MCV 83.3 89.1 89.3 89.7 90.3  PLT 246.0 219.0 265.0 276 199    Assessment/Plan  Left hip OA S/p left total hip arthroplasty. Has follow up with orthopedics. Will have her work with physical therapy and occupational therapy team to help with gait training and muscle strengthening exercises.fall precautions. Skin care. Encourage to be out of bed. Continue lovenox for dvt prophylaxis. Continue robaxin 500 mg qid for muscle spasm. D/c oxyIR. Start tramadol 50 mg 1-2 tab q4h prn for pain and reassess  Constipation On miralax daily, add colace 100 mg bid as stool softner. Hydration encouraged  Leukocytosis No signs of infection, monitor clinically, possible stress response in setting of surgery  Acute blood loss anemia Post op, monitor h&h  HTN Stable bp, continue maxzide 37.5-25 daily, lasix 40 mg daily and potassium supplement and monitor bp.  gerd Symptoms stable, continue prilosec 20 mg daily  Allergic rhinitis Stable on zyrtec 10 mg daily  Gout No recent flare up continue allopurinol 300 mg qd with colchicine 0.6 mg bid and monitor  Hypothyroidism Continue levothyroxine 137 mcg daily   Goals of care: short term rehabilitation   Family/ staff Communication: reviewed care plan with  patient and nursing supervisor    Blanchie Serve, MD  [facility] [phone] (Monday-Friday 8 am - 5 pm) [phone] (afterhours)

## 2014-10-12 ENCOUNTER — Non-Acute Institutional Stay (SKILLED_NURSING_FACILITY): Payer: 59 | Admitting: Nurse Practitioner

## 2014-10-12 ENCOUNTER — Encounter: Payer: Self-pay | Admitting: Nurse Practitioner

## 2014-10-12 DIAGNOSIS — K219 Gastro-esophageal reflux disease without esophagitis: Secondary | ICD-10-CM

## 2014-10-12 DIAGNOSIS — K5900 Constipation, unspecified: Secondary | ICD-10-CM

## 2014-10-12 DIAGNOSIS — M1612 Unilateral primary osteoarthritis, left hip: Secondary | ICD-10-CM | POA: Diagnosis not present

## 2014-10-12 DIAGNOSIS — D62 Acute posthemorrhagic anemia: Secondary | ICD-10-CM

## 2014-10-12 DIAGNOSIS — I10 Essential (primary) hypertension: Secondary | ICD-10-CM

## 2014-10-12 DIAGNOSIS — E876 Hypokalemia: Secondary | ICD-10-CM

## 2014-10-12 NOTE — Progress Notes (Signed)
Patient ID: Lori Le, female   DOB: 1965-10-10, 49 y.o.   MRN: [id number]    Nursing Home Location:  [place] of Service: SNF (31)  PCP: Einar Pheasant, MD  Allergies  Allergen Reactions  . Influenza Vaccines Hives  . Lactose Intolerance (Gi) Nausea And Vomiting    Can have yogurt Can not have milk and ice cream    . Neosporin + Pain Relief Max St [Neomy-Bacit-Polymyx-Pramoxine] Rash    Chief Complaint  Patient presents with  . Discharge Note    Discharge from SNF    HPI:  Patient is a 49 y.o. female seen today at [facility] for discharge home. [facility] at [facility] for short term rehabilitation post hospital admission from 09/27/14-09/30/14 with left hip OA. She underwent total hip arthroplasty anterior approach. Pain is well controlled with tramadol. Had appt with ortho today and staples were removed. Reports she is ahead of schedule. Patient currently doing well with therapy, now stable to discharge home with home health.  Review of Systems:  Review of Systems  Constitutional: Negative for activity change, appetite change, fatigue and unexpected weight change.  HENT: Negative for congestion and hearing loss.   Eyes: Negative.   Respiratory: Negative for cough and shortness of breath.   Cardiovascular: Negative for chest pain, palpitations and leg swelling.  Gastrointestinal: Positive for constipation (plans to take PRN tonight, had BM today). Negative for abdominal pain and diarrhea.  Genitourinary: Negative for dysuria and difficulty urinating.  Musculoskeletal: Negative for myalgias and arthralgias.  Skin: Negative for color change and wound.  Neurological: Negative for dizziness and weakness.  Psychiatric/Behavioral: Negative for behavioral problems, confusion and agitation.    Past Medical History  Diagnosis Date  . Hypertension   . Hyperlipidemia   . Allergy   . GERD (gastroesophageal reflux disease)   .  Arthritis   . Melanoma in situ     left shoulder  . Depression   . History of eating disorder   . Migraine   . Hypothyroidism   . PCOS (polycystic ovarian syndrome)   . Gout   . Lower extremity edema   . Complication of anesthesia     nausea, slow to wake up   Past Surgical History  Procedure Laterality Date  . Cholecystectomy  2003  . Foot surgery  1994  . Gastric bypass  2011  . Laparoscopic gastric banding  2010  . Knee arthroscopy Right   . Total hip arthroplasty Left 09/27/2014    Procedure: TOTAL HIP ARTHROPLASTY ANTERIOR APPROACH;  Surgeon: Hessie Knows, MD;  Location: [facility] ORS;  Service: Orthopedics;  Laterality: Left;   Social History:   reports that she has never smoked. She has never used smokeless tobacco. She reports that she drinks alcohol. She reports that she does not use illicit drugs.  Family History  Problem Relation Age of Onset  . Arthritis Mother   . Hyperlipidemia Mother   . Hypertension Mother   . Diabetes Mother   . Arthritis Father   . Hyperlipidemia Father   . Hypertension Father   . Mental illness Father   . Diabetes Father   . Arthritis Maternal Grandmother   . Cancer Maternal Grandmother     breast cancer  . Hyperlipidemia Maternal Grandmother   . Hypertension Maternal Grandmother   . Arthritis Maternal Grandfather   . Hyperlipidemia Maternal Grandfather   . Hypertension Maternal Grandfather   . Heart disease Maternal Grandfather  heart attack  . Breast cancer      maternal and paternal grandmother    Medications: Patient's Medications  New Prescriptions   No medications on file  Previous Medications   ALLOPURINOL (ZYLOPRIM) 300 MG TABLET    TAKE ONE TABLET BY MOUTH ONE TIME DAILY    CETIRIZINE (ZYRTEC) 10 MG TABLET    Take 10 mg by mouth daily.   COLCHICINE 0.6 MG TABLET    Take 1 tablet (0.6 mg total) by mouth 2 (two) times daily.   DOCUSATE SODIUM (COLACE) 100 MG CAPSULE    Take 100 mg by mouth 2 (two) times daily.    ENOXAPARIN (LOVENOX) 40 MG/0.4ML INJECTION    Inject 0.4 mLs (40 mg total) into the skin daily.   FUROSEMIDE (LASIX) 40 MG TABLET    TAKE ONE TABLET BY MOUTH ONE TIME DAILY    LEVOTHYROXINE (SYNTHROID) 137 MCG TABLET    Take 1 tablet (137 mcg total) by mouth daily before breakfast.   MEDROXYPROGESTERONE (PROVERA) 10 MG TABLET    Take 1 tablet (10 mg total) by mouth daily.   METHOCARBAMOL (ROBAXIN) 500 MG TABLET    Take 1 tablet (500 mg total) by mouth every 6 (six) hours.   OMEPRAZOLE (PRILOSEC) 20 MG CAPSULE    Take 1 capsule (20 mg total) by mouth daily.   POLYETHYLENE GLYCOL (MIRALAX / GLYCOLAX) PACKET    Take 17 g by mouth as needed.   POTASSIUM 99 MG TABS    Take 1 tablet by mouth daily. This is OTC medication.   PROMETHAZINE (PHENERGAN) 25 MG TABLET    Take 25 mg by mouth every 6 (six) hours as needed for nausea or vomiting.   TRAMADOL (ULTRAM) 50 MG TABLET    Take 50 mg by mouth every 4 (four) hours as needed (For pain 6-10/10).   TRIAMCINOLONE (NASACORT) 55 MCG/ACT AERO NASAL INHALER    Place 2 sprays into the nose daily.   TRIAMTERENE-HYDROCHLOROTHIAZIDE (MAXZIDE-25) 37.5-25 MG PER TABLET    Take 1 tablet by mouth daily.  Modified Medications   No medications on file  Discontinued Medications   OXYCODONE (OXY IR/ROXICODONE) 5 MG IMMEDIATE RELEASE TABLET    Take one tablet by mouth every 3 hours as needed for moderate pain, take 2 tablets by mouth every 3 hours for severe pain     Physical Exam: Filed Vitals:   10/12/14 1003  BP: 109/65  Pulse: 73  Temp: 98.1 F (36.7 C)  TempSrc: Oral  Resp: 19  SpO2: 99%    Physical Exam  Constitutional: She is oriented to person, place, and time. She appears well-developed and well-nourished. No distress.  HENT:  Head: Normocephalic and atraumatic.  Mouth/Throat: Oropharynx is clear and moist. No oropharyngeal exudate.  Eyes: Conjunctivae are normal. Pupils are equal, round, and reactive to light.  Neck: Normal range of motion. Neck  supple.  Cardiovascular: Normal rate, regular rhythm and normal heart sounds.   Pulmonary/Chest: Effort normal and breath sounds normal.  Abdominal: Soft. Bowel sounds are normal.  Musculoskeletal: She exhibits no edema or tenderness.  Neurological: She is alert and oriented to person, place, and time.  Skin: Skin is warm and dry. She is not diaphoretic.  Well healing surgical  incision   Psychiatric: She has a normal mood and affect.    Labs reviewed: Basic Metabolic Panel:  Recent Labs  09/14/14 0845  09/27/14 1149 09/28/14 0433 09/29/14 0915 09/30/14 0617  NA 140  --   --  138 138  --   K 2.9*  < >  --  3.3* 2.9* 3.6  CL 98*  --   --  103 96*  --   CO2 32  --   --  30 30  --   GLUCOSE 75  --   --  95 176*  --   BUN 20  --   --  8 7  --   CREATININE 0.67  --  0.67 0.60 0.72  --   CALCIUM 9.0  --   --  7.9* 7.9*  --   < > = values in this interval not displayed. Liver Function Tests:  Recent Labs  12/20/13 0801 05/17/14 1127  AST 11 9  ALT 17 14  ALKPHOS 45 53  BILITOT 0.8 0.4  PROT 6.8 6.5  ALBUMIN 3.8 3.9   No results for input(s): LIPASE, AMYLASE in the last 8760 hours. No results for input(s): AMMONIA in the last 8760 hours. CBC:  Recent Labs  10/13/13 1552 12/20/13 0801 05/17/14 1127 09/14/14 0845 09/27/14 1149  WBC 9.3 7.0 9.2 7.5 12.8*  NEUTROABS 6.4 4.5 6.7  --   --   HGB 12.7 14.1 13.6 13.7 10.8*  HCT 40.1 42.3 40.9 42.5 33.2*  MCV 83.3 89.1 89.3 89.7 90.3  PLT 246.0 219.0 265.0 276 199   TSH:  Recent Labs  12/20/13 0801 03/10/14 1628  TSH 0.80 1.23   A1C: No results found for: HGBA1C Lipid Panel:  Recent Labs  12/20/13 0801  CHOL 181  HDL 43.10  LDLCALC 100*  TRIG 191.0*  CHOLHDL 4    Assessment/Plan 1. Primary osteoarthritis of left hip S/p left total hip arthroplasty. Continue lovenox for dvt prophylaxis per ortho Continue robaxin 500 mg qid for muscle spasm and tramadol 50 mg 1-2 tab q4h prn for pain   2. Acute  blood loss anemia After total hip, will need follow up pt PCP  3. Essential hypertension, benign Stable at this time, cont , continue maxzide 37.5-25 daily, lasix 40 mg daily and potassium supplement  4. Constipation, unspecified constipation type Had BM today but not regularly, using colace regularly but miralax PRN, will take miralax tonight   5. Gastroesophageal reflux disease, esophagitis presence not specified Stable, to cont Prilosec 20 mg daily  6. Hypokalemia Noted to be low in hospital, OTC potassium supplement added  7. Gout Continue allopurinol 300 mg qd with colchicine 0.6 mg bid   8. Hypothyroidism Continue levothyroxine 137 mcg daily  pt is stable for discharge-will need PT/OT/ per home health. No DME needed. Rx written.  will need to follow up with PCP within 2 weeks.    Carlos American. Harle Battiest  [facility] [phone] 8 am - 5 pm) [phone] (after hours)

## 2014-10-25 ENCOUNTER — Ambulatory Visit (HOSPITAL_COMMUNITY): Payer: 59 | Admitting: Psychiatry

## 2014-11-16 ENCOUNTER — Other Ambulatory Visit (INDEPENDENT_AMBULATORY_CARE_PROVIDER_SITE_OTHER): Payer: 59

## 2014-11-16 ENCOUNTER — Other Ambulatory Visit: Payer: Self-pay | Admitting: Internal Medicine

## 2014-11-16 DIAGNOSIS — I10 Essential (primary) hypertension: Secondary | ICD-10-CM

## 2014-11-16 DIAGNOSIS — D696 Thrombocytopenia, unspecified: Secondary | ICD-10-CM

## 2014-11-16 LAB — COMPREHENSIVE METABOLIC PANEL
ALK PHOS: 58 U/L (ref 39–117)
ALT: 11 U/L (ref 0–35)
AST: 11 U/L (ref 0–37)
Albumin: 3.9 g/dL (ref 3.5–5.2)
BUN: 17 mg/dL (ref 6–23)
CHLORIDE: 99 meq/L (ref 96–112)
CO2: 30 mEq/L (ref 19–32)
Calcium: 9.2 mg/dL (ref 8.4–10.5)
Creatinine, Ser: 0.63 mg/dL (ref 0.40–1.20)
GFR: 106.83 mL/min (ref >60.00)
GLUCOSE: 105 mg/dL — AB (ref 70–99)
Potassium: 3.4 mEq/L — ABNORMAL LOW (ref 3.5–5.1)
SODIUM: 139 meq/L (ref 135–145)
TOTAL PROTEIN: 6.5 g/dL (ref 6.0–8.3)
Total Bilirubin: 0.4 mg/dL (ref 0.2–1.2)

## 2014-11-16 LAB — LDL CHOLESTEROL, DIRECT: Direct LDL: 121 mg/dL

## 2014-11-16 LAB — LIPID PANEL
Cholesterol: 191 mg/dL (ref 0–200)
HDL: 48.8 mg/dL (ref >39.00)
NonHDL: 142.15
Total CHOL/HDL Ratio: 4
Triglycerides: 223 mg/dL — ABNORMAL HIGH (ref 0.0–149.0)
VLDL: 44.6 mg/dL — ABNORMAL HIGH (ref 0.0–40.0)

## 2014-11-16 NOTE — Progress Notes (Signed)
Order placed for f/u platelet count.  

## 2014-11-17 ENCOUNTER — Ambulatory Visit (INDEPENDENT_AMBULATORY_CARE_PROVIDER_SITE_OTHER): Payer: 59 | Admitting: Internal Medicine

## 2014-11-17 ENCOUNTER — Encounter
Admission: RE | Admit: 2014-11-17 | Discharge: 2014-11-17 | Disposition: A | Discharge status: Home / Self Care | Payer: 59 | Source: Ambulatory Visit | Attending: Orthopedic Surgery | Admitting: Orthopedic Surgery

## 2014-11-17 ENCOUNTER — Encounter: Payer: Self-pay | Admitting: Internal Medicine

## 2014-11-17 VITALS — BP 127/76 | HR 91 | Temp 98.0°F | Ht 67.0 in | Wt 220.4 lb

## 2014-11-17 DIAGNOSIS — E538 Deficiency of other specified B group vitamins: Secondary | ICD-10-CM | POA: Diagnosis not present

## 2014-11-17 DIAGNOSIS — M109 Gout, unspecified: Secondary | ICD-10-CM

## 2014-11-17 DIAGNOSIS — D649 Anemia, unspecified: Secondary | ICD-10-CM

## 2014-11-17 DIAGNOSIS — E7800 Pure hypercholesterolemia, unspecified: Secondary | ICD-10-CM

## 2014-11-17 DIAGNOSIS — E669 Obesity, unspecified: Secondary | ICD-10-CM

## 2014-11-17 DIAGNOSIS — Z9884 Bariatric surgery status: Secondary | ICD-10-CM

## 2014-11-17 DIAGNOSIS — I10 Essential (primary) hypertension: Secondary | ICD-10-CM

## 2014-11-17 DIAGNOSIS — Z01818 Encounter for other preprocedural examination: Secondary | ICD-10-CM | POA: Diagnosis not present

## 2014-11-17 DIAGNOSIS — M10072 Idiopathic gout, left ankle and foot: Secondary | ICD-10-CM

## 2014-11-17 DIAGNOSIS — M1612 Unilateral primary osteoarthritis, left hip: Secondary | ICD-10-CM

## 2014-11-17 DIAGNOSIS — F32A Depression, unspecified: Secondary | ICD-10-CM

## 2014-11-17 DIAGNOSIS — K219 Gastro-esophageal reflux disease without esophagitis: Secondary | ICD-10-CM | POA: Diagnosis not present

## 2014-11-17 DIAGNOSIS — F329 Major depressive disorder, single episode, unspecified: Secondary | ICD-10-CM

## 2014-11-17 DIAGNOSIS — E039 Hypothyroidism, unspecified: Secondary | ICD-10-CM

## 2014-11-17 HISTORY — DX: Other specified postprocedural states: R11.2

## 2014-11-17 HISTORY — DX: Other specified postprocedural states: Z98.890

## 2014-11-17 LAB — URINALYSIS COMPLETE WITH MICROSCOPIC (ARMC ONLY)
Bilirubin Urine: NEGATIVE
GLUCOSE, UA: NEGATIVE mg/dL
Hgb urine dipstick: NEGATIVE
Ketones, ur: NEGATIVE mg/dL
LEUKOCYTES UA: NEGATIVE
NITRITE: NEGATIVE
Protein, ur: NEGATIVE mg/dL
SPECIFIC GRAVITY, URINE: 1.008 (ref 1.005–1.030)
pH: 6 (ref 5.0–8.0)

## 2014-11-17 LAB — PROTIME-INR
INR: 1.02
PROTHROMBIN TIME: 13.6 s (ref 11.4–15.0)

## 2014-11-17 LAB — CBC
HCT: 37.3 % (ref 35.0–47.0)
HEMOGLOBIN: 11.9 g/dL — AB (ref 12.0–16.0)
MCH: 26.6 pg (ref 26.0–34.0)
MCHC: 31.8 g/dL — ABNORMAL LOW (ref 32.0–36.0)
MCV: 83.6 fL (ref 80.0–100.0)
Platelets: 300 10*3/uL (ref 150–440)
RBC: 4.46 MIL/uL (ref 3.80–5.20)
RDW: 13.3 % (ref 11.5–14.5)
WBC: 9.3 10*3/uL (ref 3.6–11.0)

## 2014-11-17 LAB — TYPE AND SCREEN
ABO/RH(D): B POS
Antibody Screen: NEGATIVE

## 2014-11-17 LAB — BASIC METABOLIC PANEL
Anion gap: 11 (ref 5–15)
BUN: 19 mg/dL (ref 6–20)
CHLORIDE: 96 mmol/L — AB (ref 101–111)
CO2: 31 mmol/L (ref 22–32)
CREATININE: 0.7 mg/dL (ref 0.44–1.00)
Calcium: 9 mg/dL (ref 8.9–10.3)
GFR calc non Af Amer: >60 mL/min (ref >60)
Glucose, Bld: 110 mg/dL — ABNORMAL HIGH (ref 65–99)
Potassium: 3.4 mmol/L — ABNORMAL LOW (ref 3.5–5.1)
Sodium: 138 mmol/L (ref 135–145)

## 2014-11-17 LAB — SURGICAL PCR SCREEN
MRSA, PCR: NEGATIVE
Staphylococcus aureus: NEGATIVE

## 2014-11-17 LAB — SEDIMENTATION RATE: SED RATE: 15 mm/h (ref 0–20)

## 2014-11-17 LAB — APTT: aPTT: 26 seconds (ref 24–36)

## 2014-11-17 MED ORDER — CYANOCOBALAMIN 1000 MCG/ML IJ SOLN
1000.0000 ug | Freq: Once | INTRAMUSCULAR | Status: AC
  Administered 2014-11-17: 1000 ug via INTRAMUSCULAR

## 2014-11-17 MED ORDER — POTASSIUM CHLORIDE ER 10 MEQ PO TBCR: EXTENDED_RELEASE_TABLET | ORAL | Status: DC

## 2014-11-17 NOTE — Progress Notes (Signed)
Pre-visit discussion using our clinic review tool. No additional management support is needed unless otherwise documented below in the visit note.  

## 2014-11-17 NOTE — Progress Notes (Signed)
Patient ID: Lori Le, female   DOB: 26-Jun-1965, 49 y.o.   MRN: [id number]   Subjective:    Patient ID: Lori Le, female    DOB: 1966-01-11, 49 y.o.   MRN: [id number]  HPI  Patient here for a scheduled follow up.  Recently had hip surgery.  Doing well.  Getting along well.  Seeing ortho.  Planning for knee surgery soon.  She is staying with her parents.  Decreased stress while staying with them.  She is away from some of her other family issues.  This has helped.  Planning to go to the beach with them for a month.  No cardiac symptoms with increased activity or exertion.  No sob.  Eating and drinking well.  Watching her diet.  Has lost weight.  Feels better.  Bowels stable.     Past Medical History  Diagnosis Date  . Hypertension   . Hyperlipidemia   . Allergy   . GERD (gastroesophageal reflux disease)   . Arthritis   . Melanoma in situ     left shoulder  . Depression   . History of eating disorder   . Migraine   . Hypothyroidism   . PCOS (polycystic ovarian syndrome)   . Gout   . Lower extremity edema   . Complication of anesthesia     nausea, slow to wake up  . PONV (postoperative nausea and vomiting)    Past Surgical History  Procedure Laterality Date  . Cholecystectomy  2003  . Foot surgery  1994  . Gastric bypass  2011  . Laparoscopic gastric banding  2010  . Knee arthroscopy Right   . Total hip arthroplasty Left 09/27/2014    Procedure: TOTAL HIP ARTHROPLASTY ANTERIOR APPROACH;  Surgeon: Hessie Knows, MD;  Location: [facility] ORS;  Service: Orthopedics;  Laterality: Left;   Family History  Problem Relation Age of Onset  . Arthritis Mother   . Hyperlipidemia Mother   . Hypertension Mother   . Diabetes Mother   . Arthritis Father   . Hyperlipidemia Father   . Hypertension Father   . Mental illness Father   . Diabetes Father   . Arthritis Maternal Grandmother   . Cancer Maternal Grandmother     breast cancer  . Hyperlipidemia Maternal Grandmother   .  Hypertension Maternal Grandmother   . Arthritis Maternal Grandfather   . Hyperlipidemia Maternal Grandfather   . Hypertension Maternal Grandfather   . Heart disease Maternal Grandfather     heart attack  . Breast cancer      maternal and paternal grandmother   Social History   Social History  . Marital Status: Married    Spouse Name: N/A  . Number of Children: N/A  . Years of Education: N/A   Social History Main Topics  . Smoking status: Never Smoker   . Smokeless tobacco: Never Used  . Alcohol Use: 0.0 - 0.6 oz/week    0 Standard drinks or equivalent, 0-1 Glasses of wine per week     Comment: occ.  . Drug Use: No  . Sexual Activity: Not Asked   Other Topics Concern  . None   Social History Narrative    Outpatient Encounter Prescriptions as of 11/17/2014  Medication Sig  . colchicine 0.6 MG tablet Take 1 tablet (0.6 mg total) by mouth 2 (two) times daily.  . furosemide (LASIX) 40 MG tablet TAKE ONE TABLET BY MOUTH ONE TIME DAILY   . levothyroxine (SYNTHROID) 137 MCG tablet Take 1  tablet (137 mcg total) by mouth daily before breakfast.  . medroxyPROGESTERone (PROVERA) 10 MG tablet Take 1 tablet (10 mg total) by mouth daily.  [name] omeprazole (PRILOSEC) 20 MG capsule Take 1 capsule (20 mg total) by mouth daily. (Patient taking differently: Take 20 mg by mouth daily. This is OTC medication.)  . polyethylene glycol (MIRALAX / GLYCOLAX) packet Take 17 g by mouth as needed.  . triamterene-hydrochlorothiazide (MAXZIDE-25) 37.5-25 MG per tablet Take 1 tablet by mouth daily.  . potassium chloride (K-DUR) 10 MEQ tablet Take one tablet bid for two days and then one q day.  . [EXPIRED] cyanocobalamin ((VITAMIN B-12)) injection 1,000 mcg    No facility-administered encounter medications on file as of 11/17/2014.    Review of Systems  Constitutional: Negative for appetite change and unexpected weight change.  HENT: Negative for congestion and sinus pressure.   Respiratory: Negative for  cough, chest tightness and shortness of breath.   Cardiovascular: Negative for chest pain, palpitations and leg swelling.  Gastrointestinal: Negative for nausea, vomiting, abdominal pain and diarrhea.  Genitourinary: Negative for dysuria and difficulty urinating.  Musculoskeletal:       S/p surgery and doing well.  Decreased pain.    Skin: Negative for color change and rash.  Neurological: Negative for dizziness and light-headedness.  Psychiatric/Behavioral: Negative for dysphoric mood and agitation.       Stress is better.        Objective:    Physical Exam  Constitutional: She appears well-developed and well-nourished. No distress.  HENT:  Nose: Nose normal.  Mouth/Throat: Oropharynx is clear and moist.  Eyes: Conjunctivae are normal. Right eye exhibits no discharge. Left eye exhibits no discharge.  Neck: Neck supple. No thyromegaly present.  Cardiovascular: Normal rate and regular rhythm.   Pulmonary/Chest: Breath sounds normal. No respiratory distress. She has no wheezes.  Abdominal: Soft. Bowel sounds are normal. There is no tenderness.  Musculoskeletal: She exhibits no edema or tenderness.  Lymphadenopathy:    She has no cervical adenopathy.  Skin: No rash noted. No erythema.  Psychiatric: She has a normal mood and affect. Her behavior is normal.    BP 127/76 mmHg  Pulse 91  Temp(Src) 98 F (36.7 C) (Oral)  Ht 5\' 7"  (1.702 m)  Wt 220 lb 6 oz (99.961 kg)  BMI 34.51 kg/m2  SpO2 97% Wt Readings from Last 3 Encounters:  11/17/14 220 lb 6 oz (99.961 kg)  11/17/14 217 lb (98.431 kg)  09/27/14 259 lb 11.2 oz (117.799 kg)     Lab Results  Component Value Date   WBC 9.3 11/17/2014   HGB 11.9* 11/17/2014   HCT 37.3 11/17/2014   PLT 300 11/17/2014   GLUCOSE 110* 11/17/2014   CHOL 191 11/16/2014   TRIG 223.0* 11/16/2014   HDL 48.80 11/16/2014   LDLDIRECT 121.0 11/16/2014   LDLCALC 100* 12/20/2013   ALT 11 11/16/2014   AST 11 11/16/2014   NA 138 11/17/2014   K  3.4* 11/17/2014   CL 96* 11/17/2014   CREATININE 0.70 11/17/2014   BUN 19 11/17/2014   CO2 31 11/17/2014   TSH 1.23 03/10/2014   INR 1.02 11/17/2014       Assessment & Plan:   Problem List Items Addressed This Visit    Anemia    Recheck cbc and ferritin.        Relevant Medications   cyanocobalamin ((VITAMIN B-12)) injection 1,000 mcg (Completed)   Other Relevant Orders   CBC with Differential/Platelet  Ferritin   Depression    On wellbutrin.  Appears to be doing better.  Less stress currently - staying with her parents.  Follow.        Essential hypertension, benign    Blood pressure under good control.  Continue same medication regimen.  Follow pressures.  Follow metabolic panel.        Relevant Orders   Basic metabolic panel   GERD (gastroesophageal reflux disease)    Controlled on prilosec.  Follow.       Gout    Persistent intermittent flares.  Following in [place].        H/O gastric bypass    Has lost weight.  Check cbc and ferritin.        Hypercholesterolemia    Low cholesterol diet and exercise.  Follow lipid panel.        Hypothyroidism    On thyroid replacement.  Follow tsh.        Obesity (BMI 30-39.9)    Diet and exercise.  Follow.        Primary localized osteoarthritis of left hip    S/p hip surgery.  Doing well.  Continue f/u with ortho.         Other Visit Diagnoses    B12 deficiency    -  Primary    Relevant Medications    cyanocobalamin ((VITAMIN B-12)) injection 1,000 mcg (Completed)        Einar Pheasant, MD

## 2014-11-17 NOTE — Patient Instructions (Signed)
  Your procedure is scheduled on:  December 01 2014 Report to [place] Entrance To find out your arrival time please call [phone] between 1PM - 3PM on November 30 2014  Remember: Instructions that are not followed completely may result in serious medical risk, up to and including death, or upon the discretion of your surgeon and anesthesiologist your surgery may need to be rescheduled.    __x__ 1. Do not eat food or drink liquids after midnight. No gum chewing or hard candies.     __x__ 2. No Alcohol for 24 hours before or after surgery.   ____ 3. Bring all medications with you on the day of surgery if instructed.    _ x___ 4. Notify your doctor if there is any change in your medical condition     (cold, fever, infections).     Do not wear jewelry, make-up, hairpins, clips or nail polish.  Do not wear lotions, powders, or perfumes. You may wear deodorant.  Do not shave 48 hours prior to surgery. Men may shave face and neck.  Do not bring valuables to the hospital.    [name] is not responsible for any belongings or valuables.               Contacts, dentures or bridgework may not be worn into surgery.  Leave your suitcase in the car. After surgery it may be brought to your room.  For patients admitted to the hospital, discharge time is determined by your                treatment team.   Patients discharged the day of surgery will not be allowed to drive home.   Please read over the following fact sheets that you were given:      __x__ Take these medicines the morning of surgery with A SIP OF WATER:    1. Omeprazole night before surgery and morning of surgery  2.   3.   4.  5.  6.  ____ Fleet Enema (as directed)   _x___ Use CHG Soap as directed  ____ Use inhalers on the day of surgery  ____ Stop metformin 2 days prior to surgery    ____ Take 1/2 of usual insulin dose the night before surgery and none on the morning of surgery.   ____ Stop  Coumadin/Plavix/aspirin on  _x___ Stop Anti-inflammatories on   ____ Stop supplements until after surgery.    ____ Bring C-Pap to the hospital.

## 2014-11-22 ENCOUNTER — Encounter: Payer: Self-pay | Admitting: Internal Medicine

## 2014-11-22 DIAGNOSIS — D649 Anemia, unspecified: Secondary | ICD-10-CM | POA: Insufficient documentation

## 2014-11-26 ENCOUNTER — Encounter: Payer: Self-pay | Admitting: Internal Medicine

## 2014-11-26 NOTE — Assessment & Plan Note (Signed)
On thyroid replacement.  Follow tsh.  

## 2014-11-26 NOTE — Assessment & Plan Note (Signed)
Low cholesterol diet and exercise.  Follow lipid panel.   

## 2014-11-26 NOTE — Assessment & Plan Note (Signed)
Recheck cbc and ferritin.   

## 2014-11-26 NOTE — Assessment & Plan Note (Signed)
Has lost weight.  Check cbc and ferritin.

## 2014-11-26 NOTE — Assessment & Plan Note (Signed)
Blood pressure under good control.  Continue same medication regimen.  Follow pressures.  Follow metabolic panel.   

## 2014-11-26 NOTE — Assessment & Plan Note (Signed)
S/p hip surgery.  Doing well.  Continue f/u with ortho.

## 2014-11-26 NOTE — Assessment & Plan Note (Signed)
Diet and exercise.  Follow.  

## 2014-11-26 NOTE — Assessment & Plan Note (Signed)
On wellbutrin.  Appears to be doing better.  Less stress currently - staying with her parents.  Follow.

## 2014-11-26 NOTE — Assessment & Plan Note (Signed)
Persistent intermittent flares.  Following in [place].

## 2014-11-26 NOTE — Assessment & Plan Note (Signed)
Controlled on prilosec.  Follow.  

## 2014-11-28 ENCOUNTER — Other Ambulatory Visit (INDEPENDENT_AMBULATORY_CARE_PROVIDER_SITE_OTHER): Payer: 59

## 2014-11-28 DIAGNOSIS — I10 Essential (primary) hypertension: Secondary | ICD-10-CM | POA: Diagnosis not present

## 2014-11-28 DIAGNOSIS — D649 Anemia, unspecified: Secondary | ICD-10-CM

## 2014-11-28 DIAGNOSIS — D696 Thrombocytopenia, unspecified: Secondary | ICD-10-CM | POA: Diagnosis not present

## 2014-11-28 LAB — CBC WITH DIFFERENTIAL/PLATELET
Basophils Absolute: 0 10*3/uL (ref 0.0–0.1)
Basophils Relative: 0.5 % (ref 0.0–3.0)
EOS PCT: 2.7 % (ref 0.0–5.0)
Eosinophils Absolute: 0.2 10*3/uL (ref 0.0–0.7)
HEMATOCRIT: 35 % — AB (ref 36.0–46.0)
HEMOGLOBIN: 11.2 g/dL — AB (ref 12.0–15.0)
LYMPHS PCT: 26.3 % (ref 12.0–46.0)
Lymphs Abs: 1.9 10*3/uL (ref 0.7–4.0)
MCHC: 32.1 g/dL (ref 30.0–36.0)
MCV: 82.3 fl (ref 78.0–100.0)
MONO ABS: 0.5 10*3/uL (ref 0.1–1.0)
Monocytes Relative: 6.6 % (ref 3.0–12.0)
Neutro Abs: 4.6 10*3/uL (ref 1.4–7.7)
Neutrophils Relative %: 63.9 % (ref 43.0–77.0)
Platelets: 312 10*3/uL (ref 150.0–400.0)
RBC: 4.25 Mil/uL (ref 3.87–5.11)
RDW: 13.3 % (ref 11.5–15.5)
WBC: 7.3 10*3/uL (ref 4.0–10.5)

## 2014-11-28 LAB — BASIC METABOLIC PANEL
BUN: 11 mg/dL (ref 6–23)
CHLORIDE: 99 meq/L (ref 96–112)
CO2: 29 meq/L (ref 19–32)
CREATININE: 0.7 mg/dL (ref 0.40–1.20)
Calcium: 9 mg/dL (ref 8.4–10.5)
GFR: 94.58 mL/min (ref >60.00)
GLUCOSE: 101 mg/dL — AB (ref 70–99)
Potassium: 3.3 mEq/L — ABNORMAL LOW (ref 3.5–5.1)
SODIUM: 139 meq/L (ref 135–145)

## 2014-11-28 LAB — FERRITIN: FERRITIN: 7 ng/mL — AB (ref 10.0–291.0)

## 2014-11-29 ENCOUNTER — Encounter: Payer: Self-pay | Admitting: Internal Medicine

## 2014-11-29 ENCOUNTER — Other Ambulatory Visit: Payer: Self-pay | Admitting: Internal Medicine

## 2014-11-29 DIAGNOSIS — E876 Hypokalemia: Secondary | ICD-10-CM

## 2014-11-29 LAB — PLATELET COUNT: Platelets: 355 10*3/uL (ref 150–400)

## 2014-11-29 NOTE — Progress Notes (Signed)
Order placed for f/u potassium.  

## 2014-12-01 ENCOUNTER — Inpatient Hospital Stay: Payer: 59 | Admitting: Anesthesiology

## 2014-12-01 ENCOUNTER — Inpatient Hospital Stay
Admission: AD | Admit: 2014-12-01 | Discharge: 2014-12-04 | DRG: 470 | Disposition: A | Discharge status: Home Health | Payer: 59 | Source: Ambulatory Visit | Attending: Orthopedic Surgery | Admitting: Orthopedic Surgery

## 2014-12-01 ENCOUNTER — Encounter: Payer: Self-pay | Admitting: *Deleted

## 2014-12-01 ENCOUNTER — Inpatient Hospital Stay: Payer: 59

## 2014-12-01 ENCOUNTER — Encounter
Admission: AD | Disposition: A | Discharge status: Home Health | Payer: Self-pay | Source: Ambulatory Visit | Attending: Orthopedic Surgery

## 2014-12-01 DIAGNOSIS — M1712 Unilateral primary osteoarthritis, left knee: Secondary | ICD-10-CM | POA: Diagnosis present

## 2014-12-01 DIAGNOSIS — D62 Acute posthemorrhagic anemia: Secondary | ICD-10-CM | POA: Diagnosis not present

## 2014-12-01 DIAGNOSIS — G8918 Other acute postprocedural pain: Secondary | ICD-10-CM

## 2014-12-01 DIAGNOSIS — M1710 Unilateral primary osteoarthritis, unspecified knee: Secondary | ICD-10-CM | POA: Diagnosis present

## 2014-12-01 HISTORY — PX: JOINT REPLACEMENT: SHX530

## 2014-12-01 HISTORY — PX: TOTAL KNEE ARTHROPLASTY: SHX125

## 2014-12-01 LAB — CREATININE, SERUM: Creatinine, Ser: 0.63 mg/dL (ref 0.44–1.00)

## 2014-12-01 LAB — POTASSIUM: POTASSIUM: 4 mmol/L (ref 3.5–5.1)

## 2014-12-01 LAB — CBC
HEMATOCRIT: 32.6 % — AB (ref 35.0–47.0)
HEMOGLOBIN: 10.1 g/dL — AB (ref 12.0–16.0)
MCH: 25.8 pg — ABNORMAL LOW (ref 26.0–34.0)
MCHC: 31 g/dL — ABNORMAL LOW (ref 32.0–36.0)
MCV: 83.1 fL (ref 80.0–100.0)
Platelets: 223 10*3/uL (ref 150–440)
RBC: 3.93 MIL/uL (ref 3.80–5.20)
RDW: 13.5 % (ref 11.5–14.5)
WBC: 8.2 10*3/uL (ref 3.6–11.0)

## 2014-12-01 LAB — POCT PREGNANCY, URINE: Preg Test, Ur: NEGATIVE

## 2014-12-01 SURGERY — ARTHROPLASTY, KNEE, TOTAL
Anesthesia: Spinal | Laterality: Left

## 2014-12-01 MED ORDER — OXYCODONE HCL 5 MG PO TABS
5.0000 mg | ORAL_TABLET | ORAL | Status: DC | PRN
  Administered 2014-12-01 (×2): 5 mg via ORAL
  Administered 2014-12-01 – 2014-12-02 (×2): 10 mg via ORAL
  Administered 2014-12-02: 5 mg via ORAL
  Administered 2014-12-02 (×2): 10 mg via ORAL
  Administered 2014-12-02 (×2): 5 mg via ORAL
  Administered 2014-12-03 (×2): 10 mg via ORAL
  Administered 2014-12-03 – 2014-12-04 (×2): 5 mg via ORAL
  Filled 2014-12-01 (×4): qty 2
  Filled 2014-12-01 (×3): qty 1
  Filled 2014-12-01: qty 2
  Filled 2014-12-01 (×2): qty 1
  Filled 2014-12-01 (×2): qty 2
  Filled 2014-12-01: qty 1

## 2014-12-01 MED ORDER — METOCLOPRAMIDE HCL 5 MG/ML IJ SOLN
5.0000 mg | Freq: Three times a day (TID) | INTRAMUSCULAR | Status: DC | PRN
  Administered 2014-12-02: 10 mg via INTRAVENOUS
  Filled 2014-12-01: qty 2

## 2014-12-01 MED ORDER — BUPIVACAINE LIPOSOME 1.3 % IJ SUSP
INTRAMUSCULAR | Status: AC
  Filled 2014-12-01: qty 20

## 2014-12-01 MED ORDER — ONDANSETRON HCL 4 MG/2ML IJ SOLN
4.0000 mg | Freq: Four times a day (QID) | INTRAMUSCULAR | Status: DC | PRN
  Filled 2014-12-01: qty 2

## 2014-12-01 MED ORDER — FUROSEMIDE 40 MG PO TABS
40.0000 mg | ORAL_TABLET | Freq: Every day | ORAL | Status: DC
  Administered 2014-12-01 – 2014-12-04 (×4): 40 mg via ORAL
  Filled 2014-12-01 (×4): qty 1

## 2014-12-01 MED ORDER — BUPIVACAINE HCL (PF) 0.5 % IJ SOLN
INTRAMUSCULAR | Status: DC | PRN
  Administered 2014-12-01: 3 mL

## 2014-12-01 MED ORDER — POTASSIUM CHLORIDE ER 10 MEQ PO TBCR
10.0000 meq | EXTENDED_RELEASE_TABLET | Freq: Two times a day (BID) | ORAL | Status: DC
Start: 2014-12-01 — End: 2014-12-04
  Administered 2014-12-01 – 2014-12-04 (×6): 10 meq via ORAL
  Filled 2014-12-01 (×14): qty 1

## 2014-12-01 MED ORDER — CEFAZOLIN SODIUM-DEXTROSE 2-3 GM-% IV SOLR
2.0000 g | Freq: Four times a day (QID) | INTRAVENOUS | Status: AC
  Administered 2014-12-01 (×3): 2 g via INTRAVENOUS
  Filled 2014-12-01 (×3): qty 50

## 2014-12-01 MED ORDER — SODIUM CHLORIDE 0.9 % IV SOLN
INTRAVENOUS | Status: DC
  Administered 2014-12-01 – 2014-12-02 (×2): via INTRAVENOUS

## 2014-12-01 MED ORDER — NEOMYCIN-POLYMYXIN B GU 40-200000 IR SOLN
Status: AC
  Filled 2014-12-01: qty 20

## 2014-12-01 MED ORDER — SODIUM CHLORIDE 0.9 % IJ SOLN
INTRAMUSCULAR | Status: AC
  Filled 2014-12-01: qty 50

## 2014-12-01 MED ORDER — METHOCARBAMOL 1000 MG/10ML IJ SOLN
500.0000 mg | Freq: Four times a day (QID) | INTRAVENOUS | Status: DC | PRN
  Filled 2014-12-01: qty 5

## 2014-12-01 MED ORDER — ONDANSETRON HCL 4 MG PO TABS
4.0000 mg | ORAL_TABLET | Freq: Four times a day (QID) | ORAL | Status: DC | PRN
  Administered 2014-12-01 – 2014-12-04 (×10): 4 mg via ORAL
  Filled 2014-12-01 (×10): qty 1

## 2014-12-01 MED ORDER — FENTANYL CITRATE (PF) 100 MCG/2ML IJ SOLN: 25.0000 ug | INTRAMUSCULAR | Status: DC | PRN

## 2014-12-01 MED ORDER — LACTATED RINGERS IV SOLN
INTRAVENOUS | Status: DC
  Administered 2014-12-01: 07:00:00 via INTRAVENOUS

## 2014-12-01 MED ORDER — METOCLOPRAMIDE HCL 5 MG PO TABS
5.0000 mg | ORAL_TABLET | Freq: Three times a day (TID) | ORAL | Status: DC | PRN
  Administered 2014-12-01: 10 mg via ORAL
  Filled 2014-12-01: qty 2

## 2014-12-01 MED ORDER — ENOXAPARIN SODIUM 30 MG/0.3ML ~~LOC~~ SOLN
30.0000 mg | Freq: Two times a day (BID) | SUBCUTANEOUS | Status: DC
  Administered 2014-12-02 – 2014-12-04 (×5): 30 mg via SUBCUTANEOUS
  Filled 2014-12-01 (×5): qty 0.3

## 2014-12-01 MED ORDER — LEVOTHYROXINE SODIUM 137 MCG PO TABS
137.0000 ug | ORAL_TABLET | Freq: Every day | ORAL | Status: DC
  Administered 2014-12-02 – 2014-12-04 (×2): 137 ug via ORAL
  Filled 2014-12-01 (×2): qty 1

## 2014-12-01 MED ORDER — MENTHOL 3 MG MT LOZG: 1.0000 | LOZENGE | OROMUCOSAL | Status: DC | PRN

## 2014-12-01 MED ORDER — GENTAMICIN SULFATE 40 MG/ML IJ SOLN
INTRAMUSCULAR | Status: AC
  Filled 2014-12-01: qty 8

## 2014-12-01 MED ORDER — PROPOFOL INFUSION 10 MG/ML OPTIME
INTRAVENOUS | Status: DC | PRN
  Administered 2014-12-01: 50 ug/kg/min via INTRAVENOUS

## 2014-12-01 MED ORDER — TRIAMTERENE-HCTZ 37.5-25 MG PO TABS
1.0000 | ORAL_TABLET | Freq: Every day | ORAL | Status: DC
Start: 2014-12-01 — End: 2014-12-04
  Administered 2014-12-01 – 2014-12-04 (×4): 1 via ORAL
  Filled 2014-12-01 (×4): qty 1

## 2014-12-01 MED ORDER — PROPOFOL 10 MG/ML IV BOLUS
INTRAVENOUS | Status: DC | PRN
  Administered 2014-12-01: 30 mg via INTRAVENOUS

## 2014-12-01 MED ORDER — TRANEXAMIC ACID 1000 MG/10ML IV SOLN
1000.0000 mg | INTRAVENOUS | Status: AC
  Administered 2014-12-01: 1000 mg via INTRAVENOUS
  Filled 2014-12-01: qty 10

## 2014-12-01 MED ORDER — CEFAZOLIN SODIUM-DEXTROSE 2-3 GM-% IV SOLR: 2.0000 g | Freq: Once | INTRAVENOUS | Status: DC

## 2014-12-01 MED ORDER — SENNA 8.6 MG PO TABS
1.0000 | ORAL_TABLET | Freq: Two times a day (BID) | ORAL | Status: DC
  Administered 2014-12-01 – 2014-12-04 (×6): 8.6 mg via ORAL
  Filled 2014-12-01 (×6): qty 1

## 2014-12-01 MED ORDER — MAGNESIUM CITRATE PO SOLN
1.0000 | Freq: Once | ORAL | Status: DC | PRN
  Filled 2014-12-01 (×2): qty 296

## 2014-12-01 MED ORDER — ACETAMINOPHEN 650 MG RE SUPP: 650.0000 mg | Freq: Four times a day (QID) | RECTAL | Status: DC | PRN

## 2014-12-01 MED ORDER — MIDAZOLAM HCL 5 MG/5ML IJ SOLN
INTRAMUSCULAR | Status: DC | PRN
  Administered 2014-12-01: 2 mg via INTRAVENOUS

## 2014-12-01 MED ORDER — MORPHINE SULFATE (PF) 10 MG/ML IV SOLN
INTRAVENOUS | Status: AC
  Filled 2014-12-01: qty 1

## 2014-12-01 MED ORDER — ACETAMINOPHEN 325 MG PO TABS: 650.0000 mg | ORAL_TABLET | Freq: Four times a day (QID) | ORAL | Status: DC | PRN

## 2014-12-01 MED ORDER — CEFAZOLIN SODIUM-DEXTROSE 2-3 GM-% IV SOLR
INTRAVENOUS | Status: AC
  Administered 2014-12-01: 2 g via INTRAVENOUS
  Filled 2014-12-01: qty 50

## 2014-12-01 MED ORDER — MORPHINE SULFATE (PF) 2 MG/ML IV SOLN
2.0000 mg | INTRAVENOUS | Status: DC | PRN
  Administered 2014-12-01: 2 mg via INTRAVENOUS
  Filled 2014-12-01: qty 1

## 2014-12-01 MED ORDER — BUPIVACAINE-EPINEPHRINE (PF) 0.25% -1:200000 IJ SOLN
INTRAMUSCULAR | Status: AC
  Filled 2014-12-01: qty 30

## 2014-12-01 MED ORDER — ONDANSETRON HCL 4 MG/2ML IJ SOLN: 4.0000 mg | Freq: Once | INTRAMUSCULAR | Status: DC | PRN

## 2014-12-01 MED ORDER — PHENOL 1.4 % MT LIQD: 1.0000 | OROMUCOSAL | Status: DC | PRN

## 2014-12-01 MED ORDER — ZOLPIDEM TARTRATE 5 MG PO TABS: 5.0000 mg | ORAL_TABLET | Freq: Every evening | ORAL | Status: DC | PRN

## 2014-12-01 MED ORDER — POLYETHYLENE GLYCOL 3350 17 G PO PACK
17.0000 g | PACK | ORAL | Status: DC | PRN
  Administered 2014-12-03 – 2014-12-04 (×2): 17 g via ORAL
  Filled 2014-12-01 (×2): qty 1

## 2014-12-01 MED ORDER — METHOCARBAMOL 500 MG PO TABS
750.0000 mg | ORAL_TABLET | Freq: Four times a day (QID) | ORAL | Status: DC | PRN
  Administered 2014-12-01 – 2014-12-04 (×8): 750 mg via ORAL
  Filled 2014-12-01: qty 2
  Filled 2014-12-01: qty 1
  Filled 2014-12-01: qty 2
  Filled 2014-12-01: qty 1
  Filled 2014-12-01 (×3): qty 2
  Filled 2014-12-01 (×4): qty 1

## 2014-12-01 SURGICAL SUPPLY — 54 items
BANDAGE ELASTIC 6 CLIP ST LF (GAUZE/BANDAGES/DRESSINGS) ×3 IMPLANT
BLADE SAW 1 (BLADE) ×3 IMPLANT
BLOCK CUTTING TIBIAL 4 LT CT (MISCELLANEOUS) IMPLANT
BLOCK CUTTING TIBIAL 4 MED (MISCELLANEOUS) IMPLANT
CANISTER SUCT 1200ML W/VALVE (MISCELLANEOUS) ×3 IMPLANT
CANISTER SUCT 3000ML (MISCELLANEOUS) ×6 IMPLANT
CAPT KNEE TOTAL 3 ×2 IMPLANT
CATH FOL LEG HOLDER (MISCELLANEOUS) ×3 IMPLANT
CATH TRAY METER 16FR LF (MISCELLANEOUS) ×3 IMPLANT
CEMENT HV SMART SET (Cement) ×6 IMPLANT
CHLORAPREP W/TINT 26ML (MISCELLANEOUS) ×6 IMPLANT
COOLER POLAR GLACIER W/PUMP (MISCELLANEOUS) ×3 IMPLANT
DRAPE INCISE IOBAN 66X45 STRL (DRAPES) ×6 IMPLANT
DRAPE SHEET LG 3/4 BI-LAMINATE (DRAPES) ×6 IMPLANT
ELECT CAUTERY BLADE 6.4 (BLADE) ×3 IMPLANT
GAUZE PETRO XEROFOAM 1X8 (MISCELLANEOUS) ×3 IMPLANT
GAUZE SPONGE 4X4 12PLY STRL (GAUZE/BANDAGES/DRESSINGS) ×3 IMPLANT
GLOVE BIOGEL PI IND STRL 9 (GLOVE) ×1 IMPLANT
GLOVE BIOGEL PI INDICATOR 9 (GLOVE) ×2
GLOVE SURG ORTHO 9.0 STRL STRW (GLOVE) ×3 IMPLANT
GOWN SPECIALTY ULTRA XL (MISCELLANEOUS) ×3 IMPLANT
GOWN STRL REUS W/ TWL LRG LVL3 (GOWN DISPOSABLE) ×2 IMPLANT
GOWN STRL REUS W/TWL LRG LVL3 (GOWN DISPOSABLE) ×6
HANDPIECE SUCTION TUBG SURGILV (MISCELLANEOUS) ×3 IMPLANT
HOOD PEEL AWAY FACE SHEILD DIS (HOOD) ×6 IMPLANT
IMMBOLIZER KNEE 19 BLUE UNIV (SOFTGOODS) ×3 IMPLANT
IV SET EXTENSION 6 LL TADAPT (SET/KITS/TRAYS/PACK) ×3 IMPLANT
KNEE MEDACTA TIBIAL/FEMORAL BL (Knees) ×2 IMPLANT
KNIFE SCULPS 14X20 (INSTRUMENTS) ×3 IMPLANT
NDL SAFETY 18GX1.5 (NEEDLE) ×3 IMPLANT
NDL SPNL 18GX3.5 QUINCKE PK (NEEDLE) ×1 IMPLANT
NDL SPNL 20GX3.5 QUINCKE YW (NEEDLE) ×1 IMPLANT
NEEDLE SPNL 18GX3.5 QUINCKE PK (NEEDLE) ×3 IMPLANT
NEEDLE SPNL 20GX3.5 QUINCKE YW (NEEDLE) ×3 IMPLANT
NS IRRIG 1000ML POUR BTL (IV SOLUTION) ×3 IMPLANT
PACK TOTAL KNEE (MISCELLANEOUS) ×3 IMPLANT
PAD GROUND ADULT SPLIT (MISCELLANEOUS) ×3 IMPLANT
PAD WRAPON POLAR KNEE (MISCELLANEOUS) ×1 IMPLANT
SOL .9 NS 3000ML IRR  AL (IV SOLUTION) ×2
SOL .9 NS 3000ML IRR AL (IV SOLUTION) ×1
SOL .9 NS 3000ML IRR UROMATIC (IV SOLUTION) ×1 IMPLANT
STAPLER SKIN PROX 35W (STAPLE) ×3 IMPLANT
STEM EXTENSION 11MMX30MM (Stem) ×2 IMPLANT
STRAP SAFETY BODY (MISCELLANEOUS) ×3 IMPLANT
SUCTION FRAZIER TIP 10 FR DISP (SUCTIONS) ×3 IMPLANT
SUT DVC 2 QUILL PDO  T11 36X36 (SUTURE) ×2
SUT DVC 2 QUILL PDO T11 36X36 (SUTURE) ×1 IMPLANT
SUT DVC QUILL MONODERM 30X30 (SUTURE) ×3 IMPLANT
SUT ETHIBOND NAB CT1 #1 30IN (SUTURE) ×3 IMPLANT
SYR 20CC LL (SYRINGE) ×3 IMPLANT
SYR 50ML LL SCALE MARK (SYRINGE) ×3 IMPLANT
TOWER CARTRIDGE SMART MIX (DISPOSABLE) ×3 IMPLANT
WATER STERILE IRR 1000ML POUR (IV SOLUTION) ×3 IMPLANT
WRAPON POLAR PAD KNEE (MISCELLANEOUS) ×3

## 2014-12-01 NOTE — H&P (Signed)
Reviewed paper H+P, will be scanned into chart. No changes noted.  

## 2014-12-01 NOTE — Progress Notes (Addendum)
ALERT . NO EMESIS. MEDICATED WITH ZOFRAN AND REGLAN WITH RELIEF. PAIN MEDS MAKE PT NAUSEATED. SPINAL RESOLVING TO RLE ,BUT WORN OFF OPERATIVE LEG HRS AGO. MINIMAL PAIN . MEDICATED  WITH OXYCODONE WITH RELIEF. PER DOCTOR MENZ ON ROUNDS. HE WANTS PT TO BE ON KCL SINCE ON DIURETICS. WORN BONE FOAM PILLOW FOR 2 HRS THEN REQUESTED PILLOW BE REMOVED D/T PAIN

## 2014-12-01 NOTE — Progress Notes (Signed)
Pt requesting robaxin which worked well for spasms last surgery. Dr Rudene Christians notified . md orders robaxin 750mg  po/iv q6hr prn

## 2014-12-01 NOTE — Evaluation (Signed)
Physical Therapy Evaluation Patient Details Name: Lori Le MRN: [id number] DOB: 04/08/1965 Today's Date: 12/01/2014   History of Present Illness  PT is a 49 yo female who was admitted to [facility] s/p L TKA on 12/01/14  Clinical Impression  Pt presents with hx of gout, melanoma, arthritis, GERD, hyperlipidemia, hypothyroidism, and PCOS. Examination reveals that pt performs bed mobility with min assist, transfers with mod assist +2, and ambulation was deferred today secondary to strength and sensation deficits still present from surgery today. She is showing good motivation to participate in therapy, as well as good support at home. She still has strength, ROM, and mobility deficits that will benefit from skilled PT in order for her to eventually return home safely. Pt has agreed with the recommendation to go home with HHPT. She has had recent surgery here on her left hip, and understands the orthopedic surgery rehabilitation process fairly well.     Follow Up Recommendations Home health PT    Equipment Recommendations  None recommended by PT    Recommendations for Other Services       Precautions / Restrictions Precautions Precautions: Knee;Fall Restrictions Weight Bearing Restrictions: Yes Other Position/Activity Restrictions: WBAT      Mobility  Bed Mobility Overal bed mobility: Needs Assistance Bed Mobility: Supine to Sit;Sit to Supine     Supine to sit: Min assist Sit to supine: Min assist   General bed mobility comments: Pt shows good upper body strength to perform bed mobility with use of hand rails and pushing with her hands. She requires assist for management of LEs at this time.   Transfers Overall transfer level: Needs assistance Equipment used: Rolling walker (2 wheeled) Transfers: Sit to/from Stand Sit to Stand: Mod assist;+2 physical assistance         General transfer comment: Pt transfers with good use of hands and strong push-off, however she does  struggle a bit to get fully into standing and needs assist to get up and cues for standing up straight and drawing her hips in under her. Pt performed 10 SLRs with fair strength on LLE, so the KI was not donned, but rather the therapist blocked the anterior knee of the patient for safety during transfer. No buckling observed during transfer. Pt stable in standing.   Ambulation/Gait Ambulation/Gait assistance:  (Not appropriate this date)              Stairs            Wheelchair Mobility    Modified Rankin (Stroke Patients Only)       Balance Overall balance assessment: No apparent balance deficits (not formally assessed)                                           Pertinent Vitals/Pain Pain Assessment: 0-10 Pain Score: 5  Pain Location: L knee Pain Intervention(s): Limited activity within patient's tolerance;Monitored during session;Ice applied;Premedicated before session    [place] expects to be discharged to:: Private residence Living Arrangements: Spouse/significant other Available Help at Discharge: Family;Available 24 hours/day Type of Home: House Home Access: Stairs to enter Entrance Stairs-Rails: None Entrance Stairs-Number of Steps: 2 Home Layout: One level Home Equipment: Walker - 2 wheels      Prior Function Level of Independence: Independent         Comments: Pt was ambulating and performing ADLs independently.  Hand Dominance        Extremity/Trunk Assessment   Upper Extremity Assessment: Overall WFL for tasks assessed           Lower Extremity Assessment: RLE deficits/detail;LLE deficits/detail RLE Deficits / Details: Pt demonstrating MMT of 3+/5 grossly LLE Deficits / Details: Demonstrating MMT of 3/5, weaker proximally     Communication   Communication: No difficulties  Cognition Arousal/Alertness: Awake/alert Behavior During Therapy: WFL for tasks assessed/performed Overall Cognitive  Status: Within Functional Limits for tasks assessed                      General Comments      Exercises Total Joint Exercises Goniometric ROM: L knee AAROM 9 - 82 degrees Other Exercises Other Exercises: Pt performed bilater therex x 10 reps with min assist for facilitation of movement. Exercises performed: SLR, hip abd/add, ankle pumps, quad sets, glute sets, and LAQ (through minimal range_      Assessment/Plan    PT Assessment Patient needs continued PT services  PT Diagnosis Difficulty walking;Abnormality of gait;Acute pain   PT Problem List Decreased strength;Decreased range of motion;Decreased activity tolerance;Decreased mobility;Pain  PT Treatment Interventions DME instruction;Gait training;Stair training;Functional mobility training;Therapeutic activities;Therapeutic exercise;Balance training;Neuromuscular re-education;Patient/family education;Wheelchair mobility training;Modalities;Manual techniques   PT Goals (Current goals can be found in the Care Plan section) Acute Rehab PT Goals Patient Stated Goal: to return home PT Goal Formulation: With patient Time For Goal Achievement: 12/15/14 Potential to Achieve Goals: Good    Frequency BID   Barriers to discharge        Co-evaluation               End of Session Equipment Utilized During Treatment: Gait belt Activity Tolerance: Patient tolerated treatment well Patient left: in bed;with bed alarm set;with SCD's reapplied;with call bell/phone within reach Nurse Communication: Mobility status         Time: 6256-3893 PT Time Calculation (min) (ACUTE ONLY): 28 min   Charges:         PT G CodesJanyth Contes 13-Dec-2014, 5:10 PM  Janyth Contes, SPT. [phone]

## 2014-12-01 NOTE — Transfer of Care (Signed)
Immediate Anesthesia Transfer of Care Note  Patient: Lori Le  Procedure(s) Performed: Procedure(s): TOTAL KNEE ARTHROPLASTY (Left)  Patient Location: PACU  Anesthesia Type:Regional and Spinal  Level of Consciousness: awake, alert  and oriented  Airway & Oxygen Therapy: Patient Spontanous Breathing and Patient connected to nasal cannula oxygen  Post-op Assessment: Report given to RN and Post -op Vital signs reviewed and stable  Post vital signs: Reviewed and stable  Last Vitals:  Filed Vitals:   12/01/14 0946  BP: 107/56  Pulse: 70  Temp: 35.8 C  Resp: 14    Complications: No apparent anesthesia complications

## 2014-12-01 NOTE — Anesthesia Preprocedure Evaluation (Signed)
Anesthesia Evaluation  Patient identified by MRN, date of birth, ID band Patient awake    Reviewed: Allergy & Precautions, NPO status , Patient's Chart, lab work & pertinent test results  History of Anesthesia Complications (+) PONV  Airway Mallampati: II  TM Distance: >3 FB Neck ROM: Full    Dental  (+) Caps, Chipped   Pulmonary neg pulmonary ROS,  breath sounds clear to auscultation  Pulmonary exam normal       Cardiovascular hypertension, Normal cardiovascular exam    Neuro/Psych  Headaches,    GI/Hepatic GERD-  Medicated and Controlled,  Endo/Other  Hypothyroidism   Renal/GU      Musculoskeletal  (+) Arthritis -, Osteoarthritis,    Abdominal Normal abdominal exam  (+)   Peds negative pediatric ROS (+)  Hematology  (+) anemia ,   Anesthesia Other Findings   Reproductive/Obstetrics                             Anesthesia Physical Anesthesia Plan  ASA: II  Anesthesia Plan: Spinal   Post-op Pain Management:    Induction: Intravenous  Airway Management Planned: Nasal Cannula  Additional Equipment:   Intra-op Plan:   Post-operative Plan:   Informed Consent:   Dental advisory given  Plan Discussed with: CRNA and Surgeon  Anesthesia Plan Comments:         Anesthesia Quick Evaluation

## 2014-12-01 NOTE — Progress Notes (Signed)
   12/01/14 2110  Clinical Encounter Type  Visited With Patient not available  Visit Type Spiritual support  Referral From Nurse  Consult/Referral To [place] reported to unit when she saw order in system. SPoke with the nurse who stated that patient had already had the knee replaced earlier and that was an incomplete order that had not been processed. She was unsure if the patient wanted to see a chaplain and that patient had been given her medications. I informed the nurse that I would check in with her during morning rounds to see if she would like a visit. Chaplain Adriona Kaney A. Roselynne Lortz Ext. [phone]

## 2014-12-01 NOTE — Op Note (Signed)
12/01/2014  9:44 AM  PATIENT:  Lori Le  49 y.o. female  PRE-OPERATIVE DIAGNOSIS:  Osteoarthritis left knee  POST-OPERATIVE DIAGNOSIS:  same   PROCEDURE:  Procedure(s): TOTAL KNEE ARTHROPLASTY (Left)  SURGEON: Laurene Footman, MD  ASSISTANTS: Rachelle Hora [facility]  ANESTHESIA:   spinal  EBL:  Total I/O In: -  Out: 325 [Urine:300; Blood:25]  BLOOD ADMINISTERED:none  DRAINS: none   LOCAL MEDICATIONS USED:  MARCAINE    and OTHER morphine Toradol and Exparel  SPECIMEN:  Source of Specimen:  Cut ends of bone  DISPOSITION OF SPECIMEN:  PATHOLOGY  COUNTS:  YES  TOURNIQUET: 88 minutes at 300 mmHg  IMPLANTS: Medacta GMK sphere left 5 femur, 4 tibia with 10 mm insert and 3 patella short stem on the tibia  DICTATION: .Dragon Dictation patient brought the operating room and after adequate spinal anesthesia was obtained left leg prepped and draped in sterile fashion was turned by the upper thigh. After patient identification and timeout procedures were completed tourniquet was raised to 300 mmHg. A midline skin incision was made followed by medial parapatellar arthrotomy. Proximal tibia and distal femur were exposed to allow for application the Medacta cutting guide after anterior cruciate ligament PCL and fat pad were removed approximately tibia cut carried out followed by the distal femoral cut. The 4-in-1 cutting guide was applied to the distal femur and the anterior posterior and chamfer cuts performed. The notch cut was then made and for the trochlear groove. Trial components were then placed after excision of residual menisci posteriorly with a 4 tibia baseplate preparing it with a proximal reaming for short stem and keel punch placed through the trial and 5 left femur placed 10 mm insert gave good stability through range of motion. The distal femoral drill holes were made after trials had been placed followed by the trochlear groove cut. Patella was cut using freehand technique there  is extensive wear throughout the knee particularly the patella and the lateral compartment. The patella sized to a size 3 after drill holes were made this point the bony surfaces were thoroughly irrigated and dried and the knee was infiltrated with a combination of morphine Toradol and Sensorcaine with epinephrine along with a separate injection of dilute Exparel. The bone was dried and the components cemented in place tibia first followed by the insert with set screw followed by femur knee is held in extension as the cement set and the patella button was clamped into place after the cement was set excess cement was removed and the knee was stable to range of motion with good patellofemoral tracking. The wound was closed with Ethibond to repair the medial aspect of the patella, followed by a heavy Quill for the capsule.2-0 Quill suture for the subcutaneous tissue followed by staples. Xeroform 4 x 4's ABDs and web roll and Ace wrap were applied along Polar Care  PLAN OF CARE: Admit to inpatient   PATIENT DISPOSITION:  PACU - hemodynamically stable.

## 2014-12-02 LAB — BASIC METABOLIC PANEL
Anion gap: 7 (ref 5–15)
BUN: 9 mg/dL (ref 6–20)
CO2: 29 mmol/L (ref 22–32)
CREATININE: 0.54 mg/dL (ref 0.44–1.00)
Calcium: 8 mg/dL — ABNORMAL LOW (ref 8.9–10.3)
Chloride: 101 mmol/L (ref 101–111)
GFR calc Af Amer: >60 mL/min (ref >60)
GLUCOSE: 108 mg/dL — AB (ref 65–99)
Potassium: 3.7 mmol/L (ref 3.5–5.1)
SODIUM: 137 mmol/L (ref 135–145)

## 2014-12-02 LAB — CBC
HCT: 30.1 % — ABNORMAL LOW (ref 35.0–47.0)
Hemoglobin: 9.4 g/dL — ABNORMAL LOW (ref 12.0–16.0)
MCH: 25.6 pg — AB (ref 26.0–34.0)
MCHC: 31.2 g/dL — AB (ref 32.0–36.0)
MCV: 82.2 fL (ref 80.0–100.0)
PLATELETS: 212 10*3/uL (ref 150–440)
RBC: 3.66 MIL/uL — ABNORMAL LOW (ref 3.80–5.20)
RDW: 13.3 % (ref 11.5–14.5)
WBC: 7.7 10*3/uL (ref 3.6–11.0)

## 2014-12-02 NOTE — Progress Notes (Signed)
OT Cancellation Note  Patient Details Name: Lori Le MRN: [id number] DOB: 10/28/1965   Cancelled Treatment:    Reason Eval/Treat Not Completed: Other (comment) (Patient declined secondary to nausea.)  Sharon Mt 12/02/2014, 9:55 AM

## 2014-12-02 NOTE — Progress Notes (Signed)
Physical Therapy Treatment Patient Details Name: Lori Le MRN: [id number] DOB: 01/24/1966 Today's Date: 12/02/2014    History of Present Illness PT is a 49 yo female who was admitted to [facility] s/p L TKA on 12/01/14    PT Comments    Pt making progress this morning with better strength/sensation and ambulating without any buckling. Pt somewhat limited in session by persistent cramping in L quad area just above her wrap. Seems to resolve with time, but mobility flares it up. Pt is very motivated to participate in therapy, and very pleasant. She has strength, ROM, and gait deficits that will benefit from skilled PT in order for her to return home safely.   Follow Up Recommendations  Home health PT;Supervision/Assistance - 24 hour     Equipment Recommendations  None recommended by PT    Recommendations for Other Services       Precautions / Restrictions Precautions Precautions: Knee;Fall Restrictions Weight Bearing Restrictions: Yes Other Position/Activity Restrictions: WBAT    Mobility  Bed Mobility Overal bed mobility: Needs Assistance Bed Mobility: Supine to Sit;Sit to Supine     Supine to sit: Min assist Sit to supine: Min assist   General bed mobility comments:  (Cramping slowing pt's movements. Requires breaks.)  Transfers Overall transfer level: Needs assistance Equipment used: Rolling walker (2 wheeled) Transfers: Sit to/from Stand Sit to Stand: Min assist         General transfer comment: Pt transfering with better strength today and requiring less assistance. Pt shows good functional strength in RLE to help get her into standing. No unsteadiness or LOB noted. No buckling in LLE  Ambulation/Gait Ambulation/Gait assistance: Min guard Ambulation Distance (Feet): 5 Feet Assistive device: Rolling walker (2 wheeled) Gait Pattern/deviations: Step-to pattern;Decreased step length - right;Decreased step length - left;Decreased stride length;Shuffle Gait  velocity: decreased   General Gait Details: Pt needs cues for sequencing of RW with gait as well as cues for step length to work towards eventual reciprocal pattern. No buckling or LOB with ambulation   Stairs            Wheelchair Mobility    Modified Rankin (Stroke Patients Only)       Balance Overall balance assessment: No apparent balance deficits (not formally assessed)                                  Cognition Arousal/Alertness: Awake/alert Behavior During Therapy: WFL for tasks assessed/performed Overall Cognitive Status: Within Functional Limits for tasks assessed                      Exercises Total Joint Exercises Goniometric ROM: L knee AAROM: 7 - 88 degrees  Other Exercises Other Exercises: Pt performed bilater therex x 10 reps with min assist for facilitation of movement. Exercises performed: SLR, hip abd/add, ankle pumps, quad sets, glute sets, and LAQ (through minimal range_    General Comments        Pertinent Vitals/Pain Pain Assessment: 0-10 Pain Score: 4  Pain Location: Cramping L thigh Pain Intervention(s): Limited activity within patient's tolerance;Monitored during session;Premedicated before session;Ice applied    Home Living                      Prior Function            PT Goals (current goals can now be found in the care plan section)  Acute Rehab PT Goals Patient Stated Goal: to walk to the recliner PT Goal Formulation: With patient Time For Goal Achievement: 12/15/14 Potential to Achieve Goals: Good Progress towards PT goals: Progressing toward goals    Frequency  BID    PT Plan Current plan remains appropriate    Co-evaluation             End of Session Equipment Utilized During Treatment: Gait belt Activity Tolerance: Patient tolerated treatment well Patient left: in chair;with call bell/phone within reach;with chair alarm set;with nursing/sitter in room;with SCD's reapplied;with  family/visitor present     Time: 1505-6979 PT Time Calculation (min) (ACUTE ONLY): 27 min  Charges:                       G CodesJanyth Contes 12/22/2014, 11:44 AM  Janyth Contes, SPT. [phone]

## 2014-12-02 NOTE — Progress Notes (Signed)
Pt declines removal of foley deferring removal until after physical therapy this am

## 2014-12-02 NOTE — Anesthesia Postprocedure Evaluation (Signed)
  Anesthesia Post-op Note  Patient: Lori Le  Procedure(s) Performed: Procedure(s): TOTAL KNEE ARTHROPLASTY (Left)  Anesthesia type:Spinal  Patient location: Ward Room # 138  Post pain: Pain level controlled  Post assessment: Post-op Vital signs reviewed, Patient's Cardiovascular Status Stable, Respiratory Function Stable, Patent Airway and No signs of Nausea or vomiting  Post vital signs: Reviewed and stable  Last Vitals:  Filed Vitals:   12/02/14 0739  BP: 101/31  Pulse: 72  Temp: 36.7 C  Resp: 18    Level of consciousness: awake, alert  and patient cooperative  Complications: No apparent anesthesia complications

## 2014-12-02 NOTE — Progress Notes (Signed)
Physical Therapy Treatment Patient Details Name: Lori Le MRN: [id number] DOB: 02-10-1966 Today's Date: 12/02/2014    History of Present Illness PT is a 49 yo female who was admitted to [facility] s/p L TKA on 12/01/14    PT Comments    Pt able to progress ambulation distance to 30 feet with RW CGA.  Pt did initially c/o L thigh "cramping" getting OOB but did not have any more complaints of cramping rest of session.  Anticipate with continued progress, pt will be able to discharge home with support of family and HHPT.   Follow Up Recommendations  Home health PT;Supervision/Assistance - 24 hour     Equipment Recommendations       Recommendations for Other Services       Precautions / Restrictions Precautions Precautions: Knee;Fall Restrictions Weight Bearing Restrictions: Yes Other Position/Activity Restrictions: WBAT L LE    Mobility  Bed Mobility Overal bed mobility: Needs Assistance Bed Mobility: Supine to Sit;Sit to Supine     Supine to sit: Min assist Sit to supine: Min assist   General bed mobility comments: assist for L LE; increased time to sit on edge of bed d/t L thigh cramping  Transfers Overall transfer level: Needs assistance Equipment used: Rolling walker (2 wheeled) Transfers: Sit to/from Stand Sit to Stand: Min assist            Ambulation/Gait Ambulation/Gait assistance: Min guard Ambulation Distance (Feet): 30 Feet Assistive device: Rolling walker (2 wheeled) Gait Pattern/deviations: Step-to pattern;Decreased step length - right;Decreased stance time - left Gait velocity: decreased   General Gait Details: pt required vc's for increasing UE support through RW in order to increase stance time on L LE   Stairs            Wheelchair Mobility    Modified Rankin (Stroke Patients Only)       Balance                                    Cognition Arousal/Alertness: Awake/alert Behavior During Therapy: WFL for tasks  assessed/performed Overall Cognitive Status: Within Functional Limits for tasks assessed                      Exercises   Performed sitting exercises x 10 reps B LE's:  Heel/toe raises (AROM R; AROM L); LAQ's (AROM R; AAROM L); marching/hip flexion (AROM R; AAROM L).  Pt required vc's and tactile cues for correct technique with exercises.     General Comments  Pt agreeable to PT session.      Pertinent Vitals/Pain Pain Assessment: 0-10 Pain Score: 6  Pain Location: L knee Pain Descriptors / Indicators: Aching;Sore;Tender Pain Intervention(s): Limited activity within patient's tolerance;Monitored during session;Premedicated before session;Repositioned;Ice applied  Vitals stable and WFL throughout treatment session.    Home Living                      Prior Function            PT Goals (current goals can now be found in the care plan section) Acute Rehab PT Goals Patient Stated Goal: to walk further PT Goal Formulation: With patient Time For Goal Achievement: 12/15/14 Potential to Achieve Goals: Good Progress towards PT goals: Progressing toward goals    Frequency  BID    PT Plan Current plan remains appropriate    Co-evaluation  End of Session Equipment Utilized During Treatment: Gait belt Activity Tolerance: Patient limited by pain Patient left: in bed;with call bell/phone within reach;with bed alarm set;with SCD's reapplied (pt placed in Zero Degree Knee (bone foam); polar care applied)     Time: 9987-2158 PT Time Calculation (min) (ACUTE ONLY): 28 min  Charges:  $Therapeutic Exercise: 8-22 mins $Therapeutic Activity: 8-22 mins                    G CodesLeitha Bleak Dec 14, 2014, 4:40 PM [name], [place]

## 2014-12-02 NOTE — Care Management Note (Addendum)
Case Management Note  Patient Details  Name: Lori Le MRN: [id number] Date of Birth: [birth year]  Subjective/Objective:                  Met with patient and her husband to discuss discharge planning. She plans to return home with her husband followed by [facility]. She has a rolling walker available to use. She uses [facility] [phone].   Action/Plan: Lovenox 60m #14 [facility] to [facility]. [facility] RNCM will continue to follow.   Expected Discharge Date:                  Expected Discharge Plan:     In-House Referral:     Discharge planning Services  CM Consult  Post Acute Care Choice:  Home Health Choice offered to:  Patient, Spouse  DME Arranged:  N/A DME Agency:     HH Arranged:  PT HH Agency:     Status of Service:  In process, will continue to follow  Medicare Important Message Given:    Date Medicare IM Given:    Medicare IM give by:    Date Additional Medicare IM Given:    Additional Medicare Important Message give by:     If [place], dates discussed:    Additional Comments: Lovenox $10.  AMarshell Garfinkel RN 12/02/2014, 9:37 AM

## 2014-12-02 NOTE — Progress Notes (Signed)
Clinical Social Worker (CSW) received SNF consult. PT is recommending home health. RN Case Manager aware of above. Please reconsult if future social work needs arise. CSW signing off.   Pattiann Solanki Morgan, LCSWA [phone] 

## 2014-12-02 NOTE — Progress Notes (Signed)
   Subjective: 1 Day Post-Op Procedure(s) (LRB): TOTAL KNEE ARTHROPLASTY (Left) Patient reports pain as mild.   Patient is well, and has had no acute complaints or problems.  We will start therapy today.  Plan is to go Home after hospital stay.  Objective: Vital signs in last 24 hours: Temp:  [96.4 F (35.8 C)-98.8 F (37.1 C)] 98.1 F (36.7 C) (09/02 0739) Pulse Rate:  [55-85] 72 (09/02 0739) Resp:  [10-18] 18 (09/02 0739) BP: (101-119)/(31-67) 101/31 mmHg (09/02 0739) SpO2:  [99 %-100 %] 100 % (09/02 0739) FiO2 (%):  [21 %] 21 % (09/01 1118) Weight:  [107.004 kg (235 lb 14.4 oz)] 107.004 kg (235 lb 14.4 oz) (09/01 1427)  Intake/Output from previous day: 09/01 0701 - 09/02 0700 In: 2758.8 [P.O.:300; I.V.:2358.8; IV Piggyback:100] Out: 2725 [Urine:2700; Blood:25] Intake/Output this shift:     Recent Labs  12/01/14 1216 12/02/14 0428  HGB 10.1* 9.4*    Recent Labs  12/01/14 1216 12/02/14 0428  WBC 8.2 7.7  RBC 3.93 3.66*  HCT 32.6* 30.1*  PLT 223 212    Recent Labs  12/01/14 0654 12/01/14 1216 12/02/14 0428  NA  --   --  137  K 4.0  --  3.7  CL  --   --  101  CO2  --   --  29  BUN  --   --  9  CREATININE  --  0.63 0.54  GLUCOSE  --   --  108*  CALCIUM  --   --  8.0*   No results for input(s): LABPT, INR in the last 72 hours.  EXAM General - Patient is Alert, Appropriate and Oriented Extremity - Neurovascular intact Sensation intact distally Intact pulses distally Dorsiflexion/Plantar flexion intact Dressing - dressing C/D/I and no drainage Motor Function - intact, moving foot and toes well on exam.   Past Medical History  Diagnosis Date  . Hyperlipidemia   . Allergy   . GERD (gastroesophageal reflux disease)   . Arthritis   . Melanoma in situ     left shoulder  . History of eating disorder   . Migraine   . Hypothyroidism   . PCOS (polycystic ovarian syndrome)   . Gout   . Lower extremity edema   . Complication of anesthesia    nausea, slow to wake up  . PONV (postoperative nausea and vomiting)     Assessment/Plan:   1 Day Post-Op Procedure(s) (LRB): TOTAL KNEE ARTHROPLASTY (Left) Active Problems:   Primary osteoarthritis of knee   Acute post op blood loss anemia      Estimated body mass index is 36.38 kg/(m^2) as calculated from the following:   Height as of this encounter: 5' 7.5" (1.715 m).   Weight as of this encounter: 107.004 kg (235 lb 14.4 oz). Advance diet Up with therapy  Needs BM Recheck labs in the am  DVT Prophylaxis - Lovenox, Foot Pumps and TED hose Weight-Bearing as tolerated to left leg D/C O2 and Pulse OX and try on Room Air  T. Rachelle Hora, PA-C [place] 12/02/2014, 7:47 AM

## 2014-12-02 NOTE — Progress Notes (Signed)
   12/02/14 0955  Clinical Encounter Type  Visited With Patient;Health care provider  Visit Type Follow-up  Consult/Referral To Chaplain  Spiritual Encounters  Spiritual Needs Emotional  Stress Factors  Patient Stress Factors Health changes  Chaplain rounded in unit and offered a compassionate presence. Patient asked to be kept in prayer. Chaplain Paul Torpey A. Trenise Turay Ext. [phone]

## 2014-12-03 LAB — CBC
HCT: 28.8 % — ABNORMAL LOW (ref 35.0–47.0)
Hemoglobin: 9.4 g/dL — ABNORMAL LOW (ref 12.0–16.0)
MCH: 26.5 pg (ref 26.0–34.0)
MCHC: 32.6 g/dL (ref 32.0–36.0)
MCV: 81.3 fL (ref 80.0–100.0)
PLATELETS: 199 10*3/uL (ref 150–440)
RBC: 3.54 MIL/uL — AB (ref 3.80–5.20)
RDW: 13.6 % (ref 11.5–14.5)
WBC: 7.1 10*3/uL (ref 3.6–11.0)

## 2014-12-03 MED ORDER — ONDANSETRON HCL 4 MG PO TABS: 4.0000 mg | ORAL_TABLET | Freq: Four times a day (QID) | ORAL | Status: DC | PRN

## 2014-12-03 MED ORDER — OXYCODONE HCL 5 MG PO TABS: 5.0000 mg | ORAL_TABLET | ORAL | Status: DC | PRN

## 2014-12-03 MED ORDER — ENOXAPARIN SODIUM 40 MG/0.4ML ~~LOC~~ SOLN: 40.0000 mg | Freq: Two times a day (BID) | SUBCUTANEOUS | Status: DC

## 2014-12-03 NOTE — Progress Notes (Signed)
OT Cancellation Note  Patient Details Name: BREDA BOND MRN: [id number] DOB: 04-04-1965   Cancelled Treatment:    Reason Eval/Treat Not Completed: Other (comment) (Declined dressing stating she has does this before)    Valente David M 12/03/2014, 11:12 AM

## 2014-12-03 NOTE — Discharge Summary (Signed)
Physician Discharge Summary  Subjective: 2 Days Post-Op Procedure(s) (LRB): TOTAL KNEE ARTHROPLASTY (Left) Patient reports pain as mild.   Patient seen in rounds with Dr. Roland Rack. Patient is well, and has had no acute complaints or problems Patient is ready to go home tomorrow with home health physical therapy.  Physician Discharge Summary  Patient ID: Lori Le MRN: [id number] DOB/AGE: 11/17/1965 49 y.o.  Admit date: 12/01/2014 Discharge date: 12/03/2014  Admission Diagnoses:  Discharge Diagnoses:  Active Problems:   Primary osteoarthritis of knee   Discharged Condition: fair  Hospital Course: The patient is status post left total knee replacement done on 12/01/2014. She has done well since surgery. The patient had some nausea on postoperative day 1 but has been improving. He is ambulating with physical therapy initially in the room, and progressed up to ambulating more comfortably. The patient was safe for discharge and was ready to go home on postoperative day 3.  Treatments: surgery:  PROCEDURE: Procedure(s): TOTAL KNEE ARTHROPLASTY (Left)  SURGEON: Laurene Footman, MD  ASSISTANTS: Rachelle Hora [place]  ANESTHESIA: spinal  EBL: Total I/O In: -  Out: 325 [Urine:300; Blood:25]  BLOOD ADMINISTERED:none  DRAINS: none   LOCAL MEDICATIONS USED: MARCAINE and OTHER morphine Toradol and Exparel  SPECIMEN: Source of Specimen: Cut ends of bone  DISPOSITION OF SPECIMEN: PATHOLOGY  COUNTS: YES  TOURNIQUET: 88 minutes at 300 mmHg  IMPLANTS: Medacta GMK sphere left 5 femur, 4 tibia with 10 mm insert and 3 patella short stem on the tibia  Discharge Exam: Blood pressure 133/63, pulse 74, temperature 98.2 F (36.8 C), temperature source Oral, resp. rate 18, height 5' 7.5" (1.715 m), weight 107.004 kg (235 lb 14.4 oz), SpO2 100 %.   Disposition: 03-Skilled Nursing Facility     Medication List    TAKE these medications        colchicine 0.6 MG tablet   Take 1 tablet (0.6 mg total) by mouth 2 (two) times daily.     enoxaparin 40 MG/0.4ML injection  Commonly known as:  LOVENOX  Inject 0.4 mLs (40 mg total) into the skin every 12 (twelve) hours.     furosemide 40 MG tablet  Commonly known as:  LASIX  TAKE ONE TABLET BY MOUTH ONE TIME DAILY     levothyroxine 137 MCG tablet  Commonly known as:  SYNTHROID  Take 1 tablet (137 mcg total) by mouth daily before breakfast.     medroxyPROGESTERone 10 MG tablet  Commonly known as:  PROVERA  Take 1 tablet (10 mg total) by mouth daily.     omeprazole 20 MG capsule  Commonly known as:  PRILOSEC  Take 1 capsule (20 mg total) by mouth daily.     ondansetron 4 MG tablet  Commonly known as:  ZOFRAN  Take 1 tablet (4 mg total) by mouth every 6 (six) hours as needed for nausea.     oxyCODONE 5 MG immediate release tablet  Commonly known as:  Oxy IR/ROXICODONE  Take 1-2 tablets (5-10 mg total) by mouth every 4 (four) hours as needed for breakthrough pain.     polyethylene glycol packet  Commonly known as:  MIRALAX / GLYCOLAX  Take 17 g by mouth as needed.     potassium chloride 10 MEQ tablet  Commonly known as:  K-DUR  Take one tablet bid for two days and then one q day.     triamterene-hydrochlorothiazide 37.5-25 MG per tablet  Commonly known as:  MAXZIDE-25  Take 1 tablet by mouth  daily.         Signed: Shamera Yarberry 12/03/2014, 6:52 AM   Objective: Vital signs in last 24 hours: Temp:  [98.1 F (36.7 C)-98.6 F (37 C)] 98.2 F (36.8 C) (09/03 0405) Pulse Rate:  [72-89] 74 (09/03 0405) Resp:  [18] 18 (09/03 0405) BP: (101-138)/(31-64) 133/63 mmHg (09/03 0405) SpO2:  [100 %] 100 % (09/03 0405)  Intake/Output from previous day:  Intake/Output Summary (Last 24 hours) at 12/03/14 0652 Last data filed at 12/03/14 0014  Gross per 24 hour  Intake   1785 ml  Output   3450 ml  Net  -1665 ml    Intake/Output this shift:    Labs:  Recent Labs  12/01/14 1216 12/02/14 0428  12/03/14 0323  HGB 10.1* 9.4* 9.4*    Recent Labs  12/02/14 0428 12/03/14 0323  WBC 7.7 7.1  RBC 3.66* 3.54*  HCT 30.1* 28.8*  PLT 212 199    Recent Labs  12/01/14 0654 12/01/14 1216 12/02/14 0428  NA  --   --  137  K 4.0  --  3.7  CL  --   --  101  CO2  --   --  29  BUN  --   --  9  CREATININE  --  0.63 0.54  GLUCOSE  --   --  108*  CALCIUM  --   --  8.0*   No results for input(s): LABPT, INR in the last 72 hours.  EXAM: General - Patient is Alert and Oriented Extremity - Dorsiflexion/Plantar flexion intact No cellulitis present Compartment soft Incision - clean, dry, no drainage Motor Function -  the patient was bending her knee with mild discomfort but is ambulating well with physical therapy. Plantarflexion and dorsiflexion were intact.  Assessment/Plan: 2 Days Post-Op Procedure(s) (LRB): TOTAL KNEE ARTHROPLASTY (Left) Procedure(s) (LRB): TOTAL KNEE ARTHROPLASTY (Left) Past Medical History  Diagnosis Date  . Hyperlipidemia   . Allergy   . GERD (gastroesophageal reflux disease)   . Arthritis   . Melanoma in situ     left shoulder  . History of eating disorder   . Migraine   . Hypothyroidism   . PCOS (polycystic ovarian syndrome)   . Gout   . Lower extremity edema   . Complication of anesthesia     nausea, slow to wake up  . PONV (postoperative nausea and vomiting)    Active Problems:   Primary osteoarthritis of knee  Estimated body mass index is 36.38 kg/(m^2) as calculated from the following:   Height as of this encounter: 5' 7.5" (1.715 m).   Weight as of this encounter: 107.004 kg (235 lb 14.4 oz). Plan for discharge tomorrow Diet - Regular diet Follow up - in 2 weeks Activity - WBAT Disposition - Home Condition Upon Discharge - Good DVT Prophylaxis - Lovenox and TED hose  Reche Dixon, PA-C Orthopaedic Surgery 12/03/2014, 6:52 AM

## 2014-12-03 NOTE — Discharge Instructions (Signed)

## 2014-12-03 NOTE — Progress Notes (Signed)
  Subjective: 2 Days Post-Op Procedure(s) (LRB): TOTAL KNEE ARTHROPLASTY (Left) Patient reports pain as mild.   Patient seen in rounds with Dr. Roland Rack. Patient is well, and has had no acute complaints or problems Plan is to go Home after hospital stay. Negative for chest pain and shortness of breath Fever: no Gastrointestinal: Negative for nausea and vomiting  Objective: Vital signs in last 24 hours: Temp:  [98.1 F (36.7 C)-98.6 F (37 C)] 98.2 F (36.8 C) (09/03 0405) Pulse Rate:  [72-89] 74 (09/03 0405) Resp:  [18] 18 (09/03 0405) BP: (101-138)/(31-64) 133/63 mmHg (09/03 0405) SpO2:  [100 %] 100 % (09/03 0405)  Intake/Output from previous day:  Intake/Output Summary (Last 24 hours) at 12/03/14 0646 Last data filed at 12/03/14 0014  Gross per 24 hour  Intake   1785 ml  Output   3450 ml  Net  -1665 ml    Intake/Output this shift:    Labs:  Recent Labs  12/01/14 1216 12/02/14 0428 12/03/14 0323  HGB 10.1* 9.4* 9.4*    Recent Labs  12/02/14 0428 12/03/14 0323  WBC 7.7 7.1  RBC 3.66* 3.54*  HCT 30.1* 28.8*  PLT 212 199    Recent Labs  12/01/14 0654 12/01/14 1216 12/02/14 0428  NA  --   --  137  K 4.0  --  3.7  CL  --   --  101  CO2  --   --  29  BUN  --   --  9  CREATININE  --  0.63 0.54  GLUCOSE  --   --  108*  CALCIUM  --   --  8.0*   No results for input(s): LABPT, INR in the last 72 hours.   EXAM General - Patient is Alert and Oriented Extremity - Neurovascular intact Dorsiflexion/Plantar flexion intact No cellulitis present Compartment soft Dressing/Incision - clean, dry, no drainage, new honeycomb dressing applied today. Motor Function - intact, moving foot and toes well on exam. The patient ambulated 30 feet with physical therapy.  Past Medical History  Diagnosis Date  . Hyperlipidemia   . Allergy   . GERD (gastroesophageal reflux disease)   . Arthritis   . Melanoma in situ     left shoulder  . History of eating disorder   .  Migraine   . Hypothyroidism   . PCOS (polycystic ovarian syndrome)   . Gout   . Lower extremity edema   . Complication of anesthesia     nausea, slow to wake up  . PONV (postoperative nausea and vomiting)     Assessment/Plan: 2 Days Post-Op Procedure(s) (LRB): TOTAL KNEE ARTHROPLASTY (Left) Active Problems:   Primary osteoarthritis of knee  Estimated body mass index is 36.38 kg/(m^2) as calculated from the following:   Height as of this encounter: 5' 7.5" (1.715 m).   Weight as of this encounter: 107.004 kg (235 lb 14.4 oz). Advance diet Plan for discharge tomorrow  DVT Prophylaxis - Lovenox, Foot Pumps and TED hose Weight-Bearing as tolerated to left leg  Reche Dixon, PA-C Orthopaedic Surgery 12/03/2014, 6:46 AM

## 2014-12-03 NOTE — Anesthesia Postprocedure Evaluation (Deleted)
  Anesthesia Post-op Note  Patient: Lori Le  Procedure(s) Performed: Procedure(s): TOTAL KNEE ARTHROPLASTY (Left)  Anesthesia type:Spinal  Patient location: PACU  Post pain: Pain level controlled  Post assessment: Post-op Vital signs reviewed, Patient's Cardiovascular Status Stable, Respiratory Function Stable, Patent Airway and No signs of Nausea or vomiting  Post vital signs: Reviewed and stable  Last Vitals:  Filed Vitals:   12/03/14 0729  BP: 132/62  Pulse: 82  Temp: 36.5 C  Resp:     Level of consciousness: awake, alert  and patient cooperative  Complications: No apparent anesthesia complications

## 2014-12-03 NOTE — Progress Notes (Signed)
Physical Therapy Treatment Patient Details Name: Lori Le MRN: [id number] DOB: 1965/06/02 Today's Date: 12/03/2014    History of Present Illness PT is a 49 yo female who was admitted to [facility] s/p L TKA on 12/01/14    PT Comments    Pt was able to stand without help and to get up steps with reminders about sequence, making good progress.  Pt has some issues with confidence but overall can manage mobility with vc's only.  Plan is still for her to go home as she is independent enough with her husband helping her.  Follow Up Recommendations  Home health PT;Supervision/Assistance - 24 hour     Equipment Recommendations  None recommended by PT    Recommendations for Other Services       Precautions / Restrictions Precautions Precautions: Knee;Fall Precaution Booklet Issued: No Restrictions Weight Bearing Restrictions: Yes Other Position/Activity Restrictions: WBAT L LE    Mobility  Bed Mobility Overal bed mobility: Needs Assistance Bed Mobility: Sit to Supine       Sit to supine: Supervision   General bed mobility comments: Pt used belt to lift LLE off bed and onto bed  Transfers Overall transfer level: Needs assistance Equipment used: Rolling walker (2 wheeled)   Sit to Stand: Min guard Stand pivot transfers: Min guard       General transfer comment: reminding about hand placement  Ambulation/Gait Ambulation/Gait assistance: Supervision;Min guard Ambulation Distance (Feet): 80 Feet Assistive device: Rolling walker (2 wheeled) Gait Pattern/deviations: Step-to pattern;Shuffle;Antalgic;Trunk flexed Gait velocity: decreased Gait velocity interpretation: Below normal speed for age/gender General Gait Details: reminders for posture and to bring RLE past LLE   Stairs Stairs: Yes Stairs assistance: Min guard Stair Management: One rail Left;Two rails;Step to pattern;Forwards;Backwards Number of Stairs: 8 General stair comments: cues given for sequence but pt  did have another joint surgery and does know the routine  Wheelchair Mobility    Modified Rankin (Stroke Patients Only)       Balance     Sitting balance-Leahy Scale: Good       Standing balance-Leahy Scale: Fair                      Cognition Arousal/Alertness: Awake/alert Behavior During Therapy: Anxious (about stair climbing) Overall Cognitive Status: Within Functional Limits for tasks assessed                      Exercises Other Exercises Other Exercises: BLE exercises    General Comments        Pertinent Vitals/Pain Pain Assessment: Faces Pain Score: 4  Faces Pain Scale: Hurts little more Pain Location: L knee Pain Descriptors / Indicators: Operative site guarding Pain Intervention(s): Premedicated before session;Monitored during session;Ice applied    Home Living                      Prior Function            PT Goals (current goals can now be found in the care plan section) Acute Rehab PT Goals Patient Stated Goal: to decide about SNF  Progress towards PT goals: Progressing toward goals    Frequency  BID    PT Plan Current plan remains appropriate    Co-evaluation             End of Session Equipment Utilized During Treatment: Gait belt Activity Tolerance: Patient limited by pain Patient left: with call bell/phone within reach;in bed;with bed  alarm set;with family/visitor present     Time: 4920-1007 PT Time Calculation (min) (ACUTE ONLY): 42 min  Charges:  $Gait Training: 8-22 mins $Therapeutic Activity: 23-37 mins                    G Codes:      Ramond Dial 2015-01-01, 3:26 PM   Mee Hives, PT MS Acute Rehab Dept. Number: [facility] [id number] and [place] [phone]

## 2014-12-03 NOTE — Progress Notes (Signed)
Physical Therapy Treatment Patient Details Name: Lori Le MRN: [id number] DOB: [birth year] Today's Date: 12/03/2014    History of Present Illness PT is a 49 yo female who was admitted to [facility] s/p L TKA on 12/01/14    PT Comments    Measured ROM to L knee pre and post exercise were both reduced from numbers for last 2 days.  Pt is not reporting excessive pain but is very tight with flexion, have no reason to have stepped back on Measured ROM to  L Knee.  Will continue rehab at [place] status, with pt needing to flex more, have her sitting up with legs down in chair.  Follow Up Recommendations  Home health PT;Supervision/Assistance - 24 hour     Equipment Recommendations  None recommended by PT    Recommendations for Other Services       Precautions / Restrictions Precautions Precautions: Knee;Fall Precaution Booklet Issued: No Restrictions Weight Bearing Restrictions: Yes Other Position/Activity Restrictions: WBAT L LE    Mobility  Bed Mobility Overal bed mobility: Needs Assistance Bed Mobility: Supine to Sit     Supine to sit: Min assist     General bed mobility comments: Pt struggling with picking up LLE and PT mainly assisted there  Transfers Overall transfer level: Needs assistance Equipment used: Rolling walker (2 wheeled) Transfers: Sit to/from [facility] Sit to Stand: Min guard;Min assist Stand pivot transfers: Min guard       General transfer comment: reminding about hand placement  Ambulation/Gait Ambulation/Gait assistance: Min guard Ambulation Distance (Feet): 60 Feet Assistive device: Rolling walker (2 wheeled) Gait Pattern/deviations: Step-through pattern;Trunk flexed;Wide base of support;Step-to pattern Gait velocity: decreased Gait velocity interpretation: Below normal speed for age/gender General Gait Details: reminders for posture and to bring RLE past LLE   Stairs            Wheelchair Mobility    Modified  Rankin (Stroke Patients Only)       Balance Overall balance assessment: Needs assistance Sitting-balance support: Feet supported Sitting balance-Leahy Scale: Good     Standing balance support: Bilateral upper extremity supported Standing balance-Leahy Scale: Fair                      Cognition Arousal/Alertness: Awake/alert Behavior During Therapy: WFL for tasks assessed/performed Overall Cognitive Status: Within Functional Limits for tasks assessed                      Exercises Total Joint Exercises Ankle Circles/Pumps: AROM;Both;10 reps Quad Sets: AROM;Both;15 reps Gluteal Sets: AROM;Both;15 reps Heel Slides: AROM;AAROM;Both;10 reps Hip ABduction/ADduction: AROM;AAROM;Both;15 reps Goniometric ROM: L knee flexion 63 deg wtih OP Other Exercises Other Exercises: BLE exercises    General Comments General comments (skin integrity, edema, etc.): edema in B LE's with LLE worse, has need to replenish ice in her cooling system      Pertinent Vitals/Pain Pain Assessment: 0-10 Pain Score: 5  Pain Location: L knee, sensitive L hip Pain Descriptors / Indicators: Aching Pain Intervention(s): Limited activity within patient's tolerance;Premedicated before session    Home Living                      Prior Function            PT Goals (current goals can now be found in the care plan section) Acute Rehab PT Goals Patient Stated Goal: to be able to walk without pain Progress towards PT goals: Progressing  toward goals    Frequency  BID    PT Plan Current plan remains appropriate    Co-evaluation             End of Session Equipment Utilized During Treatment: Gait belt Activity Tolerance: Patient limited by pain Patient left: in chair;with call bell/phone within reach     Time: 4935-5217 PT Time Calculation (min) (ACUTE ONLY): 35 min  Charges:  $Gait Training: 8-22 mins $Therapeutic Exercise: 8-22 mins                    G Codes:       Lori Le December 17, 2014, 10:32 AM   Lori Le, PT MS Acute Rehab Dept. Number: [facility] [id number] and [place] [phone]

## 2014-12-04 NOTE — Care Management Note (Signed)
Case Management Note  Patient Details  Name: Lori Le MRN: [id number] Date of Birth: Jan 18, 1966  Subjective/Objective:      Referral faxed to [facility] requesting home health PT. Ms [name] already has a rolling walker at home.              Action/Plan:   Expected Discharge Date:                  Expected Discharge Plan:     In-House Referral:     Discharge planning Services  CM Consult  Post Acute Care Choice:  Home Health Choice offered to:  Patient, Spouse  DME Arranged:  N/A DME Agency:     HH Arranged:  PT HH Agency:     Status of Service:  In process, will continue to follow  Medicare Important Message Given:    Date Medicare IM Given:    Medicare IM give by:    Date Additional Medicare IM Given:    Additional Medicare Important Message give by:     If discussed at [place] of Stay Meetings, dates discussed:    Additional Comments:  Dietrich Ke A, RN 12/04/2014, 9:12 AM

## 2014-12-04 NOTE — Progress Notes (Signed)
Dr Roland Rack said that it is okay for the pt to discharge before having a BM

## 2014-12-04 NOTE — Progress Notes (Signed)
Physical Therapy Treatment Patient Details Name: Lori Le MRN: [id number] DOB: Jan 31, 1966 Today's Date: 12/04/2014    History of Present Illness PT is a 49 yo female who was admitted to [facility] s/p L TKA on 12/01/14    PT Comments    Pt was able to walk with more confidence and more stability today after going through there ex to restore flexion in knee and to prep the mobility.  Her performance is home level quality and talked about deficits that are normal after having both a hip and now a knee surgery.  Her plan is to continue on with HHPT and then hopefully outpatient.  Follow Up Recommendations  Home health PT     Equipment Recommendations  None recommended by PT    Recommendations for Other Services       Precautions / Restrictions Precautions Precautions: Knee;Fall Precaution Booklet Issued: No Restrictions Weight Bearing Restrictions: Yes Other Position/Activity Restrictions: WBAT L LE    Mobility  Bed Mobility               General bed mobility comments: up when PT entered  Transfers Overall transfer level: Modified independent Equipment used: Rolling walker (2 wheeled) Transfers: Sit to/from [employer] transfer comment: asked pt to demo a safe transition which she can   Ambulation/Gait Ambulation/Gait assistance: Supervision (for safety) Ambulation Distance (Feet): 100 Feet Assistive device: Rolling walker (2 wheeled) Gait Pattern/deviations: Step-to pattern;Trunk flexed;Narrow base of support Gait velocity: decreased Gait velocity interpretation: Below normal speed for age/gender General Gait Details: reminders for posture and to bring RLE past LLE   Stairs            Wheelchair Mobility    Modified Rankin (Stroke Patients Only)       Balance Overall balance assessment: Modified Independent Sitting-balance support: Feet supported Sitting balance-Leahy Scale: Good       Standing  balance-Leahy Scale: Fair Standing balance comment: more stable on LLE                    Cognition Arousal/Alertness: Awake/alert Behavior During Therapy: WFL for tasks assessed/performed Overall Cognitive Status: Within Functional Limits for tasks assessed                      Exercises Total Joint Exercises Ankle Circles/Pumps: AROM;Both;5 reps (holding stretches) Quad Sets: AROM;Both;15 reps Heel Slides: AAROM;Left;10 reps Hip ABduction/ADduction: AROM;Both;10 reps Long Arc Quad: AROM;Left;10 reps Goniometric ROM: 80 deg flexion but functionally using 65-70    General Comments        Pertinent Vitals/Pain Pain Assessment: 0-10 Pain Score: 3  Pain Location: L knee Pain Descriptors / Indicators: Operative site guarding Pain Intervention(s): Premedicated before session    Home Living                      Prior Function            PT Goals (current goals can now be found in the care plan section) Acute Rehab PT Goals Patient Stated Goal: to get home today Progress towards PT goals: Progressing toward goals    Frequency  BID    PT Plan Current plan remains appropriate    Co-evaluation             End of Session Equipment Utilized During Treatment: Gait belt Activity Tolerance: Patient limited by pain Patient left: with call bell/phone  within reach;in chair     Time: 0939-1006 PT Time Calculation (min) (ACUTE ONLY): 27 min  Charges:  $Gait Training: 8-22 mins $Therapeutic Exercise: 8-22 mins                    G Codes:      Ramond Dial 2014/12/20, 10:42 AM   Mee Hives, PT MS Acute Rehab Dept. Number: [facility] [id number] and [place] [phone]

## 2014-12-04 NOTE — Progress Notes (Signed)
  Subjective: 3 Days Post-Op Procedure(s) (LRB): TOTAL KNEE ARTHROPLASTY (Left) Patient reports pain as mild.   Patient seen in rounds with Dr. Roland Rack. Patient is well, and has had no acute complaints or problems Plan is to go Home after hospital stay. Negative for chest pain and shortness of breath Fever: no Gastrointestinal: Negative for nausea and vomiting  Objective: Vital signs in last 24 hours: Temp:  [97.7 F (36.5 C)-98.5 F (36.9 C)] 98.1 F (36.7 C) (09/04 0444) Pulse Rate:  [80-89] 80 (09/04 0444) Resp:  [16-20] 20 (09/04 0444) BP: (123-133)/(57-83) 133/57 mmHg (09/04 0444) SpO2:  [100 %] 100 % (09/04 0444)  Intake/Output from previous day:  Intake/Output Summary (Last 24 hours) at 12/04/14 0623 Last data filed at 12/03/14 1800  Gross per 24 hour  Intake    480 ml  Output   1200 ml  Net   -720 ml    Intake/Output this shift:    Labs:  Recent Labs  12/01/14 1216 12/02/14 0428 12/03/14 0323  HGB 10.1* 9.4* 9.4*    Recent Labs  12/02/14 0428 12/03/14 0323  WBC 7.7 7.1  RBC 3.66* 3.54*  HCT 30.1* 28.8*  PLT 212 199    Recent Labs  12/01/14 0654 12/01/14 1216 12/02/14 0428  NA  --   --  137  K 4.0  --  3.7  CL  --   --  101  CO2  --   --  29  BUN  --   --  9  CREATININE  --  0.63 0.54  GLUCOSE  --   --  108*  CALCIUM  --   --  8.0*   No results for input(s): LABPT, INR in the last 72 hours.   EXAM General - Patient is Alert and Oriented Extremity - Neurovascular intact Dorsiflexion/Plantar flexion intact No cellulitis present Compartment soft Dressing/Incision - clean, dry, no drainage Motor Function - intact, moving foot and toes well on exam. The patient ambulated 80 feet and doing stairs.  Past Medical History  Diagnosis Date  . Hyperlipidemia   . Allergy   . GERD (gastroesophageal reflux disease)   . Arthritis   . Melanoma in situ     left shoulder  . History of eating disorder   . Migraine   . Hypothyroidism   . PCOS  (polycystic ovarian syndrome)   . Gout   . Lower extremity edema   . Complication of anesthesia     nausea, slow to wake up  . PONV (postoperative nausea and vomiting)     Assessment/Plan: 3 Days Post-Op Procedure(s) (LRB): TOTAL KNEE ARTHROPLASTY (Left) Active Problems:   Primary osteoarthritis of knee  Estimated body mass index is 36.38 kg/(m^2) as calculated from the following:   Height as of this encounter: 5' 7.5" (1.715 m).   Weight as of this encounter: 107.004 kg (235 lb 14.4 oz). Discharge home with home health  DVT Prophylaxis - Lovenox, Foot Pumps and TED hose Weight-Bearing as tolerated to left leg  Reche Dixon, PA-C Orthopaedic Surgery 12/04/2014, 6:23 AM

## 2014-12-06 LAB — SURGICAL PATHOLOGY

## 2014-12-20 ENCOUNTER — Other Ambulatory Visit: Payer: Self-pay

## 2014-12-28 ENCOUNTER — Encounter: Payer: Self-pay | Admitting: Obstetrics and Gynecology

## 2015-01-06 ENCOUNTER — Encounter: Payer: Self-pay | Admitting: Nurse Practitioner

## 2015-01-06 ENCOUNTER — Ambulatory Visit (INDEPENDENT_AMBULATORY_CARE_PROVIDER_SITE_OTHER): Payer: 59 | Admitting: Nurse Practitioner

## 2015-01-06 VITALS — BP 128/86 | HR 78 | Temp 98.0°F | Resp 14 | Ht 67.0 in | Wt 208.0 lb

## 2015-01-06 DIAGNOSIS — E538 Deficiency of other specified B group vitamins: Secondary | ICD-10-CM | POA: Diagnosis not present

## 2015-01-06 DIAGNOSIS — E669 Obesity, unspecified: Secondary | ICD-10-CM

## 2015-01-06 MED ORDER — CYANOCOBALAMIN 1000 MCG/ML IJ SOLN
1000.0000 ug | Freq: Once | INTRAMUSCULAR | Status: AC
  Administered 2015-01-06: 1000 ug via INTRAMUSCULAR

## 2015-01-06 NOTE — Patient Instructions (Signed)
You received your B12 injection today.   Keep up the good work with recovery!

## 2015-01-06 NOTE — Assessment & Plan Note (Signed)
Patient has a history of gastric bypass. Patient is status post multiple joint replacements since June. Patient is losing weight. Patient has a decreased appetite due to pain medications, some nausea, and the pain. Patient reports eating small meals and ginger ale. She has participated in multiple programs at work for healthy weight loss. Suggested her being at 29 BMI which would be approximately 195 pounds and this would mean 13 more pounds to lose for her. She is agreeable to this plan paperwork was filled out for her employer's insurance and handed back to the patient.

## 2015-01-06 NOTE — Assessment & Plan Note (Signed)
B12 injection given today  Lab Results  Component Value Date   VITAMINB12 168* 12/29/2012

## 2015-01-06 NOTE — Progress Notes (Signed)
Patient ID: Lori Le, female    DOB: 1965/04/03  Age: 49 y.o. MRN: [id number]  CC: Form Completion   HPI Lori Le presents for Obesity and B12 injection.   1) PT- booked through 3 weeks for 3 x a week.   Pt has completed Healthy hearts and Healthy weight programs through work. She is losing weight due to less of an appetite, but is eating adequately throughout the day. She reports the pain medications are giving her some nausea.   Patient has a form through her employer's insurance that she needs filled out today requesting alternatives for not meeting her BMI goal.  History Lori Le has a past medical history of Hyperlipidemia; Allergy; GERD (gastroesophageal reflux disease); Arthritis; Melanoma in situ; History of eating disorder; Migraine; Hypothyroidism; PCOS (polycystic ovarian syndrome); Gout; Lower extremity edema; Complication of anesthesia; and PONV (postoperative nausea and vomiting).   She has past surgical history that includes Cholecystectomy (2003); Foot surgery (1994); Gastric bypass (2011); Laparoscopic gastric banding (2010); Knee arthroscopy (Right); Total hip arthroplasty (Left, 09/27/2014); and Total knee arthroplasty (Left, 12/01/2014).   Her family history includes Arthritis in her father, maternal grandfather, maternal grandmother, and mother; Breast cancer in an other family member; Cancer in her maternal grandmother; Diabetes in her father and mother; Heart disease in her maternal grandfather; Hyperlipidemia in her father, maternal grandfather, maternal grandmother, and mother; Hypertension in her father, maternal grandfather, maternal grandmother, and mother; Mental illness in her father.She reports that she has never smoked. She has never used smokeless tobacco. She reports that she drinks alcohol. She reports that she does not use illicit drugs.  Outpatient Prescriptions Prior to Visit  Medication Sig Dispense Refill  . colchicine 0.6 MG tablet Take 1 tablet  (0.6 mg total) by mouth 2 (two) times daily. 30 tablet 0  . enoxaparin (LOVENOX) 40 MG/0.4ML injection Inject 0.4 mLs (40 mg total) into the skin every 12 (twelve) hours. 14 Syringe 0  . furosemide (LASIX) 40 MG tablet TAKE ONE TABLET BY MOUTH ONE TIME DAILY  90 tablet 2  . levothyroxine (SYNTHROID) 137 MCG tablet Take 1 tablet (137 mcg total) by mouth daily before breakfast. 30 tablet 11  . medroxyPROGESTERone (PROVERA) 10 MG tablet Take 1 tablet (10 mg total) by mouth daily. 5 tablet 0  . omeprazole (PRILOSEC) 20 MG capsule Take 1 capsule (20 mg total) by mouth daily. (Patient taking differently: Take 20 mg by mouth daily. This is OTC medication.) 90 capsule 1  . ondansetron (ZOFRAN) 4 MG tablet Take 1 tablet (4 mg total) by mouth every 6 (six) hours as needed for nausea. 20 tablet 0  . oxyCODONE (OXY IR/ROXICODONE) 5 MG immediate release tablet Take 1-2 tablets (5-10 mg total) by mouth every 4 (four) hours as needed for breakthrough pain. 40 tablet 0  . polyethylene glycol (MIRALAX / GLYCOLAX) packet Take 17 g by mouth as needed.    . potassium chloride (K-DUR) 10 MEQ tablet Take one tablet bid for two days and then one q day. 30 tablet 0  . triamterene-hydrochlorothiazide (MAXZIDE-25) 37.5-25 MG per tablet Take 1 tablet by mouth daily. 90 tablet 1   No facility-administered medications prior to visit.    ROS Review of Systems  Constitutional: Positive for appetite change. Negative for fever, chills, diaphoresis, activity change, fatigue and unexpected weight change.       Decreased appetite  Musculoskeletal: Positive for myalgias and arthralgias.       Recent left knee and left hip replacements  Psychiatric/Behavioral: The patient is not nervous/anxious.     Objective:  BP 128/86 mmHg  Pulse 78  Temp(Src) 98 F (36.7 C)  Resp 14  Ht 5\' 7"  (1.702 m)  Wt 208 lb (94.348 kg)  BMI 32.57 kg/m2  SpO2 98%  Physical Exam  Constitutional: She is oriented to person, place, and time. She  appears well-developed and well-nourished. No distress.  HENT:  Head: Normocephalic and atraumatic.  Right Ear: External ear normal.  Left Ear: External ear normal.  Abdominal: Soft.  Obese  Neurological: She is alert and oriented to person, place, and time. No cranial nerve deficit. She exhibits normal muscle tone. Coordination normal.  Skin: Skin is warm and dry. No rash noted. She is not diaphoretic.  Psychiatric: She has a normal mood and affect. Her behavior is normal. Judgment and thought content normal.   Assessment & Plan:   Lori Le was seen today for form completion.  Diagnoses and all orders for this visit:  B12 deficiency -     cyanocobalamin ((VITAMIN B-12)) injection 1,000 mcg; Inject 1 mL (1,000 mcg total) into the muscle once.  Obesity (BMI 30-39.9)   I am having Ms. Lori Le maintain her omeprazole, levothyroxine, triamterene-hydrochlorothiazide, colchicine, furosemide, medroxyPROGESTERone, polyethylene glycol, potassium chloride, enoxaparin, oxyCODONE, and ondansetron. We administered cyanocobalamin.  Meds ordered this encounter  Medications  . cyanocobalamin ((VITAMIN B-12)) injection 1,000 mcg    Sig:      Follow-up: Return if symptoms worsen or fail to improve.

## 2015-01-10 ENCOUNTER — Other Ambulatory Visit (INDEPENDENT_AMBULATORY_CARE_PROVIDER_SITE_OTHER): Payer: 59

## 2015-01-10 DIAGNOSIS — E876 Hypokalemia: Secondary | ICD-10-CM

## 2015-01-10 LAB — POTASSIUM: POTASSIUM: 3.2 meq/L — AB (ref 3.5–5.1)

## 2015-01-11 ENCOUNTER — Other Ambulatory Visit: Payer: Self-pay | Admitting: Internal Medicine

## 2015-01-11 DIAGNOSIS — E876 Hypokalemia: Secondary | ICD-10-CM

## 2015-01-11 NOTE — Progress Notes (Signed)
Order placed for f/u potassium.  

## 2015-01-12 ENCOUNTER — Encounter: Payer: Self-pay | Admitting: *Deleted

## 2015-01-13 ENCOUNTER — Other Ambulatory Visit: Payer: Self-pay | Admitting: Internal Medicine

## 2015-01-23 ENCOUNTER — Other Ambulatory Visit (INDEPENDENT_AMBULATORY_CARE_PROVIDER_SITE_OTHER): Payer: 59

## 2015-01-23 DIAGNOSIS — E876 Hypokalemia: Secondary | ICD-10-CM

## 2015-01-23 LAB — POTASSIUM: Potassium: 3.5 mEq/L (ref 3.5–5.1)

## 2015-01-24 ENCOUNTER — Other Ambulatory Visit: Payer: Self-pay | Admitting: Internal Medicine

## 2015-01-24 ENCOUNTER — Encounter: Payer: Self-pay | Admitting: *Deleted

## 2015-01-24 DIAGNOSIS — D649 Anemia, unspecified: Secondary | ICD-10-CM

## 2015-01-24 DIAGNOSIS — E876 Hypokalemia: Secondary | ICD-10-CM

## 2015-01-24 NOTE — Progress Notes (Signed)
Order placed for f/u labs.  

## 2015-01-30 ENCOUNTER — Other Ambulatory Visit: Payer: Self-pay | Admitting: Internal Medicine

## 2015-01-31 ENCOUNTER — Other Ambulatory Visit: Payer: Self-pay

## 2015-02-01 ENCOUNTER — Other Ambulatory Visit: Payer: Self-pay | Admitting: Internal Medicine

## 2015-02-02 ENCOUNTER — Other Ambulatory Visit (INDEPENDENT_AMBULATORY_CARE_PROVIDER_SITE_OTHER): Payer: 59

## 2015-02-02 DIAGNOSIS — D649 Anemia, unspecified: Secondary | ICD-10-CM | POA: Diagnosis not present

## 2015-02-02 DIAGNOSIS — E876 Hypokalemia: Secondary | ICD-10-CM

## 2015-02-02 LAB — BASIC METABOLIC PANEL
BUN: 15 mg/dL (ref 6–23)
CHLORIDE: 99 meq/L (ref 96–112)
CO2: 32 meq/L (ref 19–32)
Calcium: 9.6 mg/dL (ref 8.4–10.5)
Creatinine, Ser: 0.74 mg/dL (ref 0.40–1.20)
GFR: 88.64 mL/min (ref >60.00)
Glucose, Bld: 98 mg/dL (ref 70–99)
POTASSIUM: 4.1 meq/L (ref 3.5–5.1)
SODIUM: 140 meq/L (ref 135–145)

## 2015-02-02 LAB — HEPATIC FUNCTION PANEL
ALBUMIN: 4.1 g/dL (ref 3.5–5.2)
ALK PHOS: 53 U/L (ref 39–117)
ALT: 7 U/L (ref 0–35)
AST: 6 U/L (ref 0–37)
BILIRUBIN DIRECT: 0.1 mg/dL (ref 0.0–0.3)
TOTAL PROTEIN: 6.9 g/dL (ref 6.0–8.3)
Total Bilirubin: 0.4 mg/dL (ref 0.2–1.2)

## 2015-02-02 LAB — CBC WITH DIFFERENTIAL/PLATELET
Basophils Absolute: 0 10*3/uL (ref 0.0–0.1)
Basophils Relative: 0.4 % (ref 0.0–3.0)
EOS PCT: 0.5 % (ref 0.0–5.0)
Eosinophils Absolute: 0.1 10*3/uL (ref 0.0–0.7)
HEMATOCRIT: 34 % — AB (ref 36.0–46.0)
HEMOGLOBIN: 10.7 g/dL — AB (ref 12.0–15.0)
LYMPHS ABS: 1.6 10*3/uL (ref 0.7–4.0)
LYMPHS PCT: 14.6 % (ref 12.0–46.0)
MCHC: 31.6 g/dL (ref 30.0–36.0)
MCV: 77.7 fl — AB (ref 78.0–100.0)
MONOS PCT: 7.5 % (ref 3.0–12.0)
Monocytes Absolute: 0.8 10*3/uL (ref 0.1–1.0)
Neutro Abs: 8.7 10*3/uL — ABNORMAL HIGH (ref 1.4–7.7)
Neutrophils Relative %: 77 % (ref 43.0–77.0)
Platelets: 357 10*3/uL (ref 150.0–400.0)
RBC: 4.38 Mil/uL (ref 3.87–5.11)
RDW: 16.2 % — ABNORMAL HIGH (ref 11.5–15.5)
WBC: 11.3 10*3/uL — AB (ref 4.0–10.5)

## 2015-02-02 LAB — IBC PANEL
Iron: 22 ug/dL — ABNORMAL LOW (ref 42–145)
SATURATION RATIOS: 4.8 % — AB (ref 20.0–50.0)
TRANSFERRIN: 329 mg/dL (ref 212.0–360.0)

## 2015-02-02 LAB — FERRITIN: Ferritin: 11.2 ng/mL (ref 10.0–291.0)

## 2015-02-08 ENCOUNTER — Other Ambulatory Visit: Payer: Self-pay | Admitting: Internal Medicine

## 2015-02-13 ENCOUNTER — Other Ambulatory Visit: Payer: Self-pay | Admitting: *Deleted

## 2015-02-14 ENCOUNTER — Other Ambulatory Visit: Payer: Self-pay | Admitting: Internal Medicine

## 2015-02-14 MED ORDER — INTEGRA 62.5-62.5-40-3 MG PO CAPS: 1.0000 | ORAL_CAPSULE | Freq: Every day | ORAL | Status: DC

## 2015-02-14 NOTE — Progress Notes (Signed)
rx sent in for integra #[id number] with 2 refills.

## 2015-02-17 ENCOUNTER — Encounter: Payer: Self-pay | Admitting: Internal Medicine

## 2015-02-17 ENCOUNTER — Ambulatory Visit (INDEPENDENT_AMBULATORY_CARE_PROVIDER_SITE_OTHER): Payer: 59 | Admitting: Internal Medicine

## 2015-02-17 VITALS — BP 120/80 | HR 84 | Temp 98.3°F | Resp 18 | Ht 67.0 in | Wt 211.0 lb

## 2015-02-17 DIAGNOSIS — E669 Obesity, unspecified: Secondary | ICD-10-CM

## 2015-02-17 DIAGNOSIS — E039 Hypothyroidism, unspecified: Secondary | ICD-10-CM | POA: Diagnosis not present

## 2015-02-17 DIAGNOSIS — E7800 Pure hypercholesterolemia, unspecified: Secondary | ICD-10-CM

## 2015-02-17 DIAGNOSIS — R609 Edema, unspecified: Secondary | ICD-10-CM

## 2015-02-17 DIAGNOSIS — D649 Anemia, unspecified: Secondary | ICD-10-CM

## 2015-02-17 DIAGNOSIS — E538 Deficiency of other specified B group vitamins: Secondary | ICD-10-CM | POA: Diagnosis not present

## 2015-02-17 DIAGNOSIS — K219 Gastro-esophageal reflux disease without esophagitis: Secondary | ICD-10-CM | POA: Diagnosis not present

## 2015-02-17 DIAGNOSIS — F32A Depression, unspecified: Secondary | ICD-10-CM

## 2015-02-17 DIAGNOSIS — I10 Essential (primary) hypertension: Secondary | ICD-10-CM | POA: Diagnosis not present

## 2015-02-17 DIAGNOSIS — F329 Major depressive disorder, single episode, unspecified: Secondary | ICD-10-CM

## 2015-02-17 MED ORDER — CYANOCOBALAMIN 1000 MCG/ML IJ SOLN
1000.0000 ug | Freq: Once | INTRAMUSCULAR | Status: AC
  Administered 2015-02-17: 1000 ug via INTRAMUSCULAR

## 2015-02-17 NOTE — Progress Notes (Signed)
Patient ID: Lori Le, female   DOB: 1966-01-12, 49 y.o.   MRN: [id number]   Subjective:    Patient ID: Lori Le, female    DOB: 09-Aug-1965, 49 y.o.   MRN: [id number]  HPI  Patient with past history of hypercholesterolemia, allergies, GERD, hypothyroidism and is s/p recent TKR.  She is still going to physical therapy.  Still having trouble.  Cannot increased her rom.  It is as if she is starting over with every PT session.  She is going to wear a brace to try and increase her rom.  Taking relafen bid.  Not upsetting her stomach.  No cardiac symptoms with increased activity or exertion.  She is doing water aerobics and working out in [employer] - to try to increase her muscle strength.  Breathing stable.  Still with increased stress.  Discussed with her today.  Her therapist left.  Discussed a new therapist.  Bowels stable.     Past Medical History  Diagnosis Date  . Hyperlipidemia   . Allergy   . GERD (gastroesophageal reflux disease)   . Arthritis   . Melanoma in situ ([place])     left shoulder  . History of eating disorder   . Migraine   . Hypothyroidism   . PCOS (polycystic ovarian syndrome)   . Gout   . Lower extremity edema   . Complication of anesthesia     nausea, slow to wake up  . PONV (postoperative nausea and vomiting)    Past Surgical History  Procedure Laterality Date  . Cholecystectomy  2003  . Foot surgery  1994  . Gastric bypass  2011  . Laparoscopic gastric banding  2010  . Knee arthroscopy Right   . 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 Total knee arthroplasty Left 12/01/2014    Procedure: TOTAL KNEE ARTHROPLASTY;  Surgeon: Hessie Knows, MD;  Location: [facility] ORS;  Service: Orthopedics;  Laterality: Left;   Family History  Problem Relation Age of Onset  . Arthritis Mother   . Hyperlipidemia Mother   .  Hypertension Mother   . Diabetes Mother   . Arthritis Father   . Hyperlipidemia Father   . Hypertension Father   . Mental illness Father   . Diabetes Father   . Arthritis Maternal Grandmother   . Cancer Maternal Grandmother     breast cancer  . Hyperlipidemia Maternal Grandmother   . Hypertension Maternal Grandmother   . Arthritis Maternal Grandfather   . Hyperlipidemia Maternal Grandfather   . Hypertension Maternal Grandfather   . Heart disease Maternal Grandfather     heart attack  . Breast cancer      maternal and paternal grandmother   Social History   Social History  . Marital Status: Married    Spouse Name: N/A  . Number of Children: N/A  . Years of Education: N/A   Social History Main Topics  . Smoking status: Never Smoker   . Smokeless tobacco: Never Used  . Alcohol Use: 0.0 - 0.6 oz/week    0-1 Glasses of wine, 0 Standard drinks or equivalent per week     Comment: occ.  . Drug Use: No  . Sexual Activity: Not Asked   Other Topics Concern  . None   Social History Narrative    Outpatient Encounter Prescriptions as of 02/17/2015  Medication Sig  .  colchicine 0.6 MG tablet Take 1 tablet (0.6 mg total) by mouth 2 (two) times daily. (Patient taking differently: Take 0.6 mg by mouth 2 (two) times daily as needed. )  . Fe Fum-FePoly-Vit C-Vit B3 (INTEGRA) 62.5-62.5-40-3 MG CAPS Take 1 capsule by mouth daily.  . furosemide (LASIX) 40 MG tablet TAKE 1 TABLET BY MOUTH EVERY DAY  . KLOR-CON 10 10 MEQ tablet TAKE ONE TABLET TWICE A DAY FOR TWO DAYS AND THEN ONE TAB EVERY DAY.  [name] levothyroxine (SYNTHROID, LEVOTHROID) 137 MCG tablet TAKE ONE TABLET BY MOUTH ONCE DAILY BEFORE BREAKFAST  . omeprazole (PRILOSEC) 20 MG capsule Take 1 capsule (20 mg total) by mouth daily. (Patient taking differently: Take 20 mg by mouth daily. This is OTC medication.)  . triamterene-hydrochlorothiazide (MAXZIDE-25) 37.5-25 MG tablet TAKE 1 TABLET BY MOUTH EVERY DAY  . [DISCONTINUED] enoxaparin  (LOVENOX) 40 MG/0.4ML injection Inject 0.4 mLs (40 mg total) into the skin every 12 (twelve) hours.  . [DISCONTINUED] medroxyPROGESTERone (PROVERA) 10 MG tablet Take 1 tablet (10 mg total) by mouth daily.  . [DISCONTINUED] ondansetron (ZOFRAN) 4 MG tablet Take 1 tablet (4 mg total) by mouth every 6 (six) hours as needed for nausea.  . [DISCONTINUED] oxyCODONE (OXY IR/ROXICODONE) 5 MG immediate release tablet Take 1-2 tablets (5-10 mg total) by mouth every 4 (four) hours as needed for breakthrough pain.  . [DISCONTINUED] polyethylene glycol (MIRALAX / GLYCOLAX) packet Take 17 g by mouth as needed.  . [EXPIRED] cyanocobalamin ((VITAMIN B-12)) injection 1,000 mcg    No facility-administered encounter medications on file as of 02/17/2015.    Review of Systems  Constitutional:       Has adjusted her diet.  Is exercising as outlined.  Lost weight.   HENT: Negative for congestion and sinus pressure.   Eyes: Negative for pain and discharge.  Respiratory: Negative for cough, chest tightness and shortness of breath.   Cardiovascular: Negative for chest pain and palpitations.       Leg swelling has improved.   Gastrointestinal: Negative for nausea, vomiting and abdominal pain.  Genitourinary: Negative for dysuria and difficulty urinating.  Musculoskeletal:       Persistent knee pain as outlined.  Limited rom.    Skin: Negative for color change and rash.  Neurological: Negative for dizziness, light-headedness and headaches.  Psychiatric/Behavioral: Negative for dysphoric mood and agitation.       Increased stress as outlined.         Objective:     Blood pressure rechecked by me:  118/72  Physical Exam  Constitutional: She appears well-developed and well-nourished. No distress.  HENT:  Nose: Nose normal.  Mouth/Throat: Oropharynx is clear and moist.  Eyes: Conjunctivae are normal. Right eye exhibits no discharge. Left eye exhibits no discharge.  Neck: Neck supple. No thyromegaly present.    Cardiovascular: Normal rate and regular rhythm.   Pulmonary/Chest: Breath sounds normal. No respiratory distress. She has no wheezes.  Abdominal: Soft. Bowel sounds are normal. There is no tenderness.  Musculoskeletal: She exhibits no edema or tenderness.  Well healed incision site -  knee  Lymphadenopathy:    She has no cervical adenopathy.  Skin: No rash noted. No erythema.  Psychiatric: She has a normal mood and affect. Her behavior is normal.    BP 120/80 mmHg  Pulse 84  Temp(Src) 98.3 F (36.8 C) (Oral)  Resp 18  Ht 5\' 7"  (1.702 m)  Wt 211 lb (95.709 kg)  BMI 33.04 kg/m2  SpO2 97% Wt Readings  from Last 3 Encounters:  02/17/15 211 lb (95.709 kg)  01/06/15 208 lb (94.348 kg)  12/01/14 235 lb 14.4 oz (107.004 kg)     Lab Results  Component Value Date   WBC 11.3* 02/02/2015   HGB 10.7* 02/02/2015   HCT 34.0* 02/02/2015   PLT 357.0 02/02/2015   GLUCOSE 98 02/02/2015   CHOL 191 11/16/2014   TRIG 223.0* 11/16/2014   HDL 48.80 11/16/2014   LDLDIRECT 121.0 11/16/2014   LDLCALC 100* 12/20/2013   ALT 7 02/02/2015   AST 6 02/02/2015   NA 140 02/02/2015   K 4.1 02/02/2015   CL 99 02/02/2015   CREATININE 0.74 02/02/2015   BUN 15 02/02/2015   CO2 32 02/02/2015   TSH 1.23 03/10/2014   INR 1.02 11/17/2014       Assessment & Plan:   Problem List Items Addressed This Visit    Anemia    Recent iron decreased.  Just started integra.  Follow cbc and ferritin.       Relevant Medications   cyanocobalamin ((VITAMIN B-12)) injection 1,000 mcg (Completed)   Other Relevant Orders   CBC with Differential/Platelet   Ferritin   B12 deficiency - Primary    Continue B12 injections.        Relevant Medications   cyanocobalamin ((VITAMIN B-12)) injection 1,000 mcg (Completed)   Depression    Discussed with her today.  Still with increased stress.  She feels she is handling things relatively well.  Her therapist left the country.  Will give her names of new therapist.         Relevant Orders   TSH   Edema    Swelling has improved.  Follow.        Essential hypertension, benign    Blood pressure under good control.  Continue same medication regimen.  Follow pressures.  Follow metabolic panel.        Relevant Orders   Basic metabolic panel   GERD (gastroesophageal reflux disease)    Controlled.  On prilosec.  Follow.       Hypercholesterolemia    Low cholesterol diet and exercise.  Follow lipid panel.       Relevant Orders   Lipid panel   Hepatic function panel   Hypothyroidism    On thyroid replacement.  Follow tsh.       Obesity (BMI 30-39.9)    Has adjusted her diet.  Is exercising.  Losing weight.  Follow.            Einar Pheasant, MD

## 2015-02-17 NOTE — Progress Notes (Signed)
Pre-visit discussion using our clinic review tool. No additional management support is needed unless otherwise documented below in the visit note.  

## 2015-02-19 ENCOUNTER — Encounter: Payer: Self-pay | Admitting: Internal Medicine

## 2015-02-19 NOTE — Assessment & Plan Note (Signed)
Recent iron decreased.  Just started integra.  Follow cbc and ferritin.

## 2015-02-19 NOTE — Assessment & Plan Note (Signed)
Discussed with her today.  Still with increased stress.  She feels she is handling things relatively well.  Her therapist left the country.  Will give her names of new therapist.

## 2015-02-19 NOTE — Assessment & Plan Note (Signed)
On thyroid replacement.  Follow tsh.  

## 2015-02-19 NOTE — Assessment & Plan Note (Signed)
Blood pressure under good control.  Continue same medication regimen.  Follow pressures.  Follow metabolic panel.   

## 2015-02-19 NOTE — Assessment & Plan Note (Signed)
Continue B12 injections.   

## 2015-02-19 NOTE — Assessment & Plan Note (Signed)
Low cholesterol diet and exercise.  Follow lipid panel.   

## 2015-02-19 NOTE — Assessment & Plan Note (Signed)
Has adjusted her diet.  Is exercising.  Losing weight.  Follow.

## 2015-02-19 NOTE — Assessment & Plan Note (Signed)
Swelling has improved.  Follow.

## 2015-02-19 NOTE — Assessment & Plan Note (Signed)
Controlled.  On prilosec.  Follow.

## 2015-02-21 ENCOUNTER — Encounter: Payer: Self-pay | Admitting: Obstetrics and Gynecology

## 2015-02-22 ENCOUNTER — Encounter: Payer: Self-pay | Admitting: Internal Medicine

## 2015-02-23 ENCOUNTER — Other Ambulatory Visit: Payer: Self-pay | Admitting: Internal Medicine

## 2015-02-23 DIAGNOSIS — M2550 Pain in unspecified joint: Secondary | ICD-10-CM

## 2015-02-23 NOTE — Progress Notes (Signed)
Order placed for f/u labs.  

## 2015-02-27 ENCOUNTER — Other Ambulatory Visit: Payer: Self-pay | Admitting: *Deleted

## 2015-02-28 ENCOUNTER — Other Ambulatory Visit (INDEPENDENT_AMBULATORY_CARE_PROVIDER_SITE_OTHER): Payer: 59

## 2015-02-28 DIAGNOSIS — F329 Major depressive disorder, single episode, unspecified: Secondary | ICD-10-CM

## 2015-02-28 DIAGNOSIS — E7800 Pure hypercholesterolemia, unspecified: Secondary | ICD-10-CM

## 2015-02-28 DIAGNOSIS — D649 Anemia, unspecified: Secondary | ICD-10-CM

## 2015-02-28 DIAGNOSIS — M2550 Pain in unspecified joint: Secondary | ICD-10-CM

## 2015-02-28 DIAGNOSIS — I10 Essential (primary) hypertension: Secondary | ICD-10-CM

## 2015-02-28 DIAGNOSIS — F32A Depression, unspecified: Secondary | ICD-10-CM

## 2015-02-28 LAB — BASIC METABOLIC PANEL
BUN: 17 mg/dL (ref 6–23)
CALCIUM: 9.6 mg/dL (ref 8.4–10.5)
CHLORIDE: 94 meq/L — AB (ref 96–112)
CO2: 29 meq/L (ref 19–32)
CREATININE: 0.72 mg/dL (ref 0.40–1.20)
GFR: 91.46 mL/min (ref >60.00)
GLUCOSE: 164 mg/dL — AB (ref 70–99)
Potassium: 3.5 mEq/L (ref 3.5–5.1)
Sodium: 135 mEq/L (ref 135–145)

## 2015-02-28 LAB — CBC WITH DIFFERENTIAL/PLATELET
BASOS ABS: 0 10*3/uL (ref 0.0–0.1)
BASOS PCT: 0.2 % (ref 0.0–3.0)
EOS PCT: 0 % (ref 0.0–5.0)
Eosinophils Absolute: 0 10*3/uL (ref 0.0–0.7)
HEMATOCRIT: 37.2 % (ref 36.0–46.0)
HEMOGLOBIN: 11.8 g/dL — AB (ref 12.0–15.0)
Lymphocytes Relative: 7.3 % — ABNORMAL LOW (ref 12.0–46.0)
Lymphs Abs: 0.6 10*3/uL — ABNORMAL LOW (ref 0.7–4.0)
MCHC: 31.8 g/dL (ref 30.0–36.0)
MCV: 79.5 fl (ref 78.0–100.0)
Monocytes Absolute: 0.1 10*3/uL (ref 0.1–1.0)
Monocytes Relative: 1.1 % — ABNORMAL LOW (ref 3.0–12.0)
NEUTROS ABS: 7.3 10*3/uL (ref 1.4–7.7)
Neutrophils Relative %: 91.4 % — ABNORMAL HIGH (ref 43.0–77.0)
PLATELETS: 333 10*3/uL (ref 150.0–400.0)
RBC: 4.67 Mil/uL (ref 3.87–5.11)
RDW: 18.6 % — ABNORMAL HIGH (ref 11.5–15.5)
WBC: 8 10*3/uL (ref 4.0–10.5)

## 2015-02-28 LAB — TSH: TSH: 1.4 u[IU]/mL (ref 0.35–4.50)

## 2015-02-28 LAB — HEPATIC FUNCTION PANEL
ALBUMIN: 4.3 g/dL (ref 3.5–5.2)
ALT: 9 U/L (ref 0–35)
AST: 6 U/L (ref 0–37)
Alkaline Phosphatase: 59 U/L (ref 39–117)
BILIRUBIN DIRECT: 0 mg/dL (ref 0.0–0.3)
TOTAL PROTEIN: 6.9 g/dL (ref 6.0–8.3)
Total Bilirubin: 0.3 mg/dL (ref 0.2–1.2)

## 2015-02-28 LAB — C-REACTIVE PROTEIN: CRP: 0.2 mg/dL — ABNORMAL LOW (ref 0.5–20.0)

## 2015-02-28 LAB — RHEUMATOID FACTOR: Rhuematoid fact SerPl-aCnc: <10 IU/mL (ref <=14)

## 2015-02-28 LAB — SEDIMENTATION RATE: SED RATE: 18 mm/h (ref 0–22)

## 2015-02-28 LAB — FERRITIN: Ferritin: 20 ng/mL (ref 10.0–291.0)

## 2015-03-01 LAB — ANA: Anti Nuclear Antibody(ANA): NEGATIVE

## 2015-03-03 ENCOUNTER — Encounter: Payer: Self-pay | Admitting: Internal Medicine

## 2015-03-06 NOTE — Telephone Encounter (Signed)
Unread mychart message mailed to patient 

## 2015-03-16 ENCOUNTER — Encounter
Admission: RE | Admit: 2015-03-16 | Discharge: 2015-03-16 | Disposition: A | Discharge status: Home / Self Care | Payer: 59 | Source: Ambulatory Visit | Attending: Orthopedic Surgery | Admitting: Orthopedic Surgery

## 2015-03-16 NOTE — Patient Instructions (Signed)
  Your procedure is scheduled on: Thursday 03/23/2015 Report to Day Surgery. 2ND FLOOR MEDICAL MALL ENTRANCE To find out your arrival time please call [phone] between 1PM - 3PM on Wednesday 03/22/2015.  Remember: Instructions that are not followed completely may result in serious medical risk, up to and including death, or upon the discretion of your surgeon and anesthesiologist your surgery may need to be rescheduled.    __X__ 1. Do not eat food or drink liquids after midnight. No gum chewing or hard candies.     __X__ 2. No Alcohol for 24 hours before or after surgery.   ____ 3. Bring all medications with you on the day of surgery if instructed.    __X__ 4. Notify your doctor if there is any change in your medical condition     (cold, fever, infections).     Do not wear jewelry, make-up, hairpins, clips or nail polish.  Do not wear lotions, powders, or perfumes. You may wear deodorant.  Do not shave 48 hours prior to surgery. Men may shave face and neck.  Do not bring valuables to the hospital.    [facility] is not responsible for any belongings or valuables.               Contacts, dentures or bridgework may not be worn into surgery.  Leave your suitcase in the car. After surgery it may be brought to your room.  For patients admitted to the hospital, discharge time is determined by your                treatment team.   Patients discharged the day of surgery will not be allowed to drive home.   Please read over the following fact sheets that you were given:   Surgical Site Infection Prevention   __X__ Take these medicines the morning of surgery with A SIP OF WATER:    1. LEVOTHYROXINE  2. OMEPRAZOLE AT BEDTIME AND THE MORNING OF SURGERY  3.   4.  5.  6.  ____ Fleet Enema (as directed)   __X__ Use CHG Soap as directed  ____ Use inhalers on the day of surgery  ____ Stop metformin 2 days prior to surgery    ____ Take 1/2 of usual insulin dose the night before  surgery and none on the morning of surgery.   ____ Stop Coumadin/Plavix/aspirin on   ____ Stop Anti-inflammatories on    ____ Stop supplements until after surgery.    ____ Bring C-Pap to the hospital.

## 2015-03-23 ENCOUNTER — Encounter
Admission: RE | Disposition: A | Discharge status: Home / Self Care | Payer: Self-pay | Source: Ambulatory Visit | Attending: Orthopedic Surgery

## 2015-03-23 ENCOUNTER — Ambulatory Visit: Payer: 59 | Admitting: Anesthesiology

## 2015-03-23 ENCOUNTER — Ambulatory Visit
Admission: RE | Admit: 2015-03-23 | Discharge: 2015-03-23 | Disposition: A | Discharge status: Home / Self Care | Payer: 59 | Source: Ambulatory Visit | Attending: Orthopedic Surgery | Admitting: Orthopedic Surgery

## 2015-03-23 ENCOUNTER — Other Ambulatory Visit: Payer: Self-pay

## 2015-03-23 DIAGNOSIS — Z833 Family history of diabetes mellitus: Secondary | ICD-10-CM | POA: Insufficient documentation

## 2015-03-23 DIAGNOSIS — Z9884 Bariatric surgery status: Secondary | ICD-10-CM | POA: Insufficient documentation

## 2015-03-23 DIAGNOSIS — Z881 Allergy status to other antibiotic agents status: Secondary | ICD-10-CM | POA: Diagnosis not present

## 2015-03-23 DIAGNOSIS — F329 Major depressive disorder, single episode, unspecified: Secondary | ICD-10-CM | POA: Insufficient documentation

## 2015-03-23 DIAGNOSIS — E739 Lactose intolerance, unspecified: Secondary | ICD-10-CM | POA: Insufficient documentation

## 2015-03-23 DIAGNOSIS — I059 Rheumatic mitral valve disease, unspecified: Secondary | ICD-10-CM | POA: Diagnosis not present

## 2015-03-23 DIAGNOSIS — L501 Idiopathic urticaria: Secondary | ICD-10-CM | POA: Insufficient documentation

## 2015-03-23 DIAGNOSIS — Z8249 Family history of ischemic heart disease and other diseases of the circulatory system: Secondary | ICD-10-CM | POA: Diagnosis not present

## 2015-03-23 DIAGNOSIS — Z887 Allergy status to serum and vaccine status: Secondary | ICD-10-CM | POA: Diagnosis not present

## 2015-03-23 DIAGNOSIS — T8489XA Other specified complication of internal orthopedic prosthetic devices, implants and grafts, initial encounter: Secondary | ICD-10-CM | POA: Diagnosis present

## 2015-03-23 DIAGNOSIS — Y939 Activity, unspecified: Secondary | ICD-10-CM | POA: Diagnosis not present

## 2015-03-23 DIAGNOSIS — M65852 Other synovitis and tenosynovitis, left thigh: Secondary | ICD-10-CM | POA: Diagnosis not present

## 2015-03-23 DIAGNOSIS — I10 Essential (primary) hypertension: Secondary | ICD-10-CM | POA: Diagnosis not present

## 2015-03-23 DIAGNOSIS — K219 Gastro-esophageal reflux disease without esophagitis: Secondary | ICD-10-CM | POA: Diagnosis not present

## 2015-03-23 DIAGNOSIS — J309 Allergic rhinitis, unspecified: Secondary | ICD-10-CM | POA: Insufficient documentation

## 2015-03-23 DIAGNOSIS — E039 Hypothyroidism, unspecified: Secondary | ICD-10-CM | POA: Insufficient documentation

## 2015-03-23 DIAGNOSIS — X58XXXA Exposure to other specified factors, initial encounter: Secondary | ICD-10-CM | POA: Diagnosis not present

## 2015-03-23 DIAGNOSIS — Z96652 Presence of left artificial knee joint: Secondary | ICD-10-CM | POA: Insufficient documentation

## 2015-03-23 DIAGNOSIS — Z9049 Acquired absence of other specified parts of digestive tract: Secondary | ICD-10-CM | POA: Insufficient documentation

## 2015-03-23 DIAGNOSIS — D649 Anemia, unspecified: Secondary | ICD-10-CM | POA: Diagnosis not present

## 2015-03-23 DIAGNOSIS — Z8349 Family history of other endocrine, nutritional and metabolic diseases: Secondary | ICD-10-CM | POA: Insufficient documentation

## 2015-03-23 DIAGNOSIS — M1990 Unspecified osteoarthritis, unspecified site: Secondary | ICD-10-CM | POA: Insufficient documentation

## 2015-03-23 DIAGNOSIS — Z79899 Other long term (current) drug therapy: Secondary | ICD-10-CM | POA: Insufficient documentation

## 2015-03-23 HISTORY — PX: KNEE ARTHROSCOPY: SHX127

## 2015-03-23 LAB — POCT PREGNANCY, URINE: Preg Test, Ur: NEGATIVE

## 2015-03-23 SURGERY — ARTHROSCOPY, KNEE
Anesthesia: General | Laterality: Left

## 2015-03-23 MED ORDER — FENTANYL CITRATE (PF) 100 MCG/2ML IJ SOLN: 25.0000 ug | INTRAMUSCULAR | Status: DC | PRN

## 2015-03-23 MED ORDER — ONDANSETRON HCL 4 MG/2ML IJ SOLN: 4.0000 mg | Freq: Four times a day (QID) | INTRAMUSCULAR | Status: DC | PRN

## 2015-03-23 MED ORDER — ONDANSETRON HCL 4 MG PO TABS: 4.0000 mg | ORAL_TABLET | Freq: Four times a day (QID) | ORAL | Status: DC | PRN

## 2015-03-23 MED ORDER — FENTANYL CITRATE (PF) 100 MCG/2ML IJ SOLN
INTRAMUSCULAR | Status: DC | PRN
  Administered 2015-03-23 (×3): 50 ug via INTRAVENOUS

## 2015-03-23 MED ORDER — SCOPOLAMINE 1 MG/3DAYS TD PT72
MEDICATED_PATCH | TRANSDERMAL | Status: AC
  Administered 2015-03-23: 1.5 mg via TRANSDERMAL
  Filled 2015-03-23: qty 1

## 2015-03-23 MED ORDER — DEXAMETHASONE SODIUM PHOSPHATE 10 MG/ML IJ SOLN
INTRAMUSCULAR | Status: DC | PRN
  Administered 2015-03-23: 10 mg via INTRAVENOUS

## 2015-03-23 MED ORDER — OXYCODONE-ACETAMINOPHEN 5-325 MG PO TABS: 1.0000 | ORAL_TABLET | ORAL | Status: DC | PRN

## 2015-03-23 MED ORDER — GLYCOPYRROLATE 0.2 MG/ML IJ SOLN
INTRAMUSCULAR | Status: DC | PRN
  Administered 2015-03-23: 0.2 mg via INTRAVENOUS

## 2015-03-23 MED ORDER — LIDOCAINE HCL (CARDIAC) 20 MG/ML IV SOLN
INTRAVENOUS | Status: DC | PRN
  Administered 2015-03-23: 100 mg via INTRAVENOUS

## 2015-03-23 MED ORDER — SCOPOLAMINE 1 MG/3DAYS TD PT72
1.0000 | MEDICATED_PATCH | Freq: Once | TRANSDERMAL | Status: DC
  Administered 2015-03-23: 1.5 mg via TRANSDERMAL

## 2015-03-23 MED ORDER — LACTATED RINGERS IR SOLN
Status: DC | PRN
  Administered 2015-03-23: 9000 mL

## 2015-03-23 MED ORDER — CEFAZOLIN SODIUM 1-5 GM-% IV SOLN
INTRAVENOUS | Status: DC | PRN
  Administered 2015-03-23: 2 g via INTRAVENOUS

## 2015-03-23 MED ORDER — ONDANSETRON HCL 4 MG/2ML IJ SOLN: 4.0000 mg | Freq: Once | INTRAMUSCULAR | Status: DC | PRN

## 2015-03-23 MED ORDER — CEFAZOLIN SODIUM-DEXTROSE 2-3 GM-% IV SOLR
INTRAVENOUS | Status: AC
  Filled 2015-03-23: qty 50

## 2015-03-23 MED ORDER — SODIUM CHLORIDE 0.9 % IV SOLN: INTRAVENOUS | Status: DC

## 2015-03-23 MED ORDER — MIDAZOLAM HCL 5 MG/5ML IJ SOLN
INTRAMUSCULAR | Status: DC | PRN
  Administered 2015-03-23: 2 mg via INTRAVENOUS

## 2015-03-23 MED ORDER — BUPIVACAINE-EPINEPHRINE (PF) 0.5% -1:200000 IJ SOLN
INTRAMUSCULAR | Status: AC
  Filled 2015-03-23: qty 30

## 2015-03-23 MED ORDER — ONDANSETRON HCL 4 MG/2ML IJ SOLN
INTRAMUSCULAR | Status: DC | PRN
  Administered 2015-03-23: 4 mg via INTRAVENOUS

## 2015-03-23 MED ORDER — CEFAZOLIN SODIUM-DEXTROSE 2-3 GM-% IV SOLR: 2.0000 g | Freq: Once | INTRAVENOUS | Status: DC

## 2015-03-23 MED ORDER — PROPOFOL 10 MG/ML IV BOLUS
INTRAVENOUS | Status: DC | PRN
  Administered 2015-03-23: 200 mg via INTRAVENOUS

## 2015-03-23 MED ORDER — LACTATED RINGERS IV SOLN
INTRAVENOUS | Status: DC
  Administered 2015-03-23 (×2): via INTRAVENOUS

## 2015-03-23 MED ORDER — METOCLOPRAMIDE HCL 10 MG PO TABS: 5.0000 mg | ORAL_TABLET | Freq: Three times a day (TID) | ORAL | Status: DC | PRN

## 2015-03-23 MED ORDER — METOCLOPRAMIDE HCL 5 MG/ML IJ SOLN: 5.0000 mg | Freq: Three times a day (TID) | INTRAMUSCULAR | Status: DC | PRN

## 2015-03-23 SURGICAL SUPPLY — 32 items
BANDAGE ACE 4X5 VEL STRL LF (GAUZE/BANDAGES/DRESSINGS) ×3 IMPLANT
BANDAGE ELASTIC 4 LF NS (GAUZE/BANDAGES/DRESSINGS) ×3 IMPLANT
BLADE FULL RADIUS 3.5 (BLADE) ×1 IMPLANT
BLADE INCISOR PLUS 4.5 (BLADE) ×3 IMPLANT
BLADE SHAVER 4.5 DBL SERAT CV (CUTTER) ×1 IMPLANT
BLADE SHAVER 4.5X7 STR FR (MISCELLANEOUS) ×1 IMPLANT
BNDG CMPR MED 5X4 ELC HKLP NS (GAUZE/BANDAGES/DRESSINGS) ×1
CHLORAPREP W/TINT 26ML (MISCELLANEOUS) ×3 IMPLANT
CUTTER AGGRESSIVE+ 3.5 (CUTTER) ×1 IMPLANT
GAUZE PETRO XEROFOAM 1X8 (MISCELLANEOUS) ×3 IMPLANT
GAUZE SPONGE 4X4 12PLY STRL (GAUZE/BANDAGES/DRESSINGS) ×3 IMPLANT
GLOVE BIO SURGEON STRL SZ7 (GLOVE) ×2 IMPLANT
GLOVE BIOGEL PI IND STRL 7.5 (GLOVE) IMPLANT
GLOVE BIOGEL PI IND STRL 9 (GLOVE) ×1 IMPLANT
GLOVE BIOGEL PI INDICATOR 7.5 (GLOVE) ×2
GLOVE BIOGEL PI INDICATOR 9 (GLOVE) ×2
GLOVE SURG ORTHO 9.0 STRL STRW (GLOVE) ×3 IMPLANT
GOWN SPECIALTY ULTRA XL (MISCELLANEOUS) ×3 IMPLANT
GOWN STRL REUS W/ TWL LRG LVL3 (GOWN DISPOSABLE) ×1 IMPLANT
GOWN STRL REUS W/TWL LRG LVL3 (GOWN DISPOSABLE) ×3
IV LACTATED RINGER IRRG 3000ML (IV SOLUTION) ×6
IV LR IRRIG 3000ML ARTHROMATIC (IV SOLUTION) ×4 IMPLANT
KIT RM TURNOVER STRD PROC AR (KITS) ×3 IMPLANT
MANIFOLD NEPTUNE II (INSTRUMENTS) ×3 IMPLANT
PACK ARTHROSCOPY KNEE (MISCELLANEOUS) ×3 IMPLANT
SET TUBE SUCT SHAVER OUTFL 24K (TUBING) ×3 IMPLANT
SET TUBE TIP INTRA-ARTICULAR (MISCELLANEOUS) ×3 IMPLANT
SUT ETHILON 4-0 (SUTURE) ×3
SUT ETHILON 4-0 FS2 18XMFL BLK (SUTURE) ×1
SUTURE ETHLN 4-0 FS2 18XMF BLK (SUTURE) ×1 IMPLANT
TUBING ARTHRO INFLOW-ONLY STRL (TUBING) ×3 IMPLANT
WAND HAND CNTRL MULTIVAC 50 (MISCELLANEOUS) ×3 IMPLANT

## 2015-03-23 NOTE — H&P (Signed)
Reviewed paper H+P, will be scanned into chart. No changes noted.  

## 2015-03-23 NOTE — Op Note (Signed)
03/23/2015  8:17 AM  PATIENT:  Lori Le  49 y.o. female  PRE-OPERATIVE DIAGNOSIS:  synovitis left total knee  POST-OPERATIVE DIAGNOSIS:  synovitis left total knee  PROCEDURE:  Procedure(s): ARTHROSCOPY KNEE, partial synovectomy (Left)  SURGEON: Laurene Footman, MD  ASSISTANTS: None  ANESTHESIA:   general  EBL:  Total I/O In: 500 [I.V.:500] Out: 5 [Blood:5]  BLOOD ADMINISTERED:none  DRAINS: none   LOCAL MEDICATIONS USED:  NONE  SPECIMEN:  No Specimen  DISPOSITION OF SPECIMEN:  N/A  COUNTS:  YES  TOURNIQUET:   Total Tourniquet Time Documented: Thigh (Left) - 20 minutes Total: Thigh (Left) - 20 minutes   IMPLANTS: None  DICTATION: .Dragon Dictation patient brought the operating room and after adequate general anesthesia was obtained the left leg was placed in arthroscopic leg holder with tourniquet applied. after prepping draping the sterile fashion appropriate patient identification and timeout procedures were completed. Inferolateral portal was made and the arthroscope was introduced there was extensive synovitis present within the suprapatellar pouch and on further inspection there was synovium encroaching on the joint space medially and especially laterally. After initial inspection shaver was introduced and some synovium resected superiorly medially and laterally care being taken to avoid damage to the polyethylene. The patella also had synovium surrounding it that might have been a cause of pain and shaver followed by a secure wand were used to remove flaps of synovium. After excising the synovium that appeared to be a problem, the knee was irrigated until clear. Argentation was withdrawn and the wounds were closed with simple interrupted 4-0 nylon. Xeroform 4 x 4 web roll and Ace wrap applied  PLAN OF CARE: Discharge to home after PACU  PATIENT DISPOSITION:  PACU - hemodynamically stable.

## 2015-03-23 NOTE — Anesthesia Postprocedure Evaluation (Signed)
Anesthesia Post Note  Patient: Lori Le  Procedure(s) Performed: Procedure(s) (LRB): ARTHROSCOPY KNEE, partial synovectomy (Left)  Patient location during evaluation: PACU Anesthesia Type: General Level of consciousness: awake Pain management: satisfactory to patient Vital Signs Assessment: post-procedure vital signs reviewed and stable Respiratory status: nonlabored ventilation Cardiovascular status: stable Anesthetic complications: no    Last Vitals:  Filed Vitals:   03/23/15 0830 03/23/15 0833  BP: 130/85   Pulse: 85 71  Temp:    Resp: 16 17    Last Pain: There were no vitals filed for this visit.               VAN STAVEREN,Symone Cornman

## 2015-03-23 NOTE — Transfer of Care (Signed)
Immediate Anesthesia Transfer of Care Note  Patient: Lori Le  Procedure(s) Performed: Procedure(s): ARTHROSCOPY KNEE, partial synovectomy (Left)  Patient Location: PACU  Anesthesia Type:General  Level of Consciousness: awake, alert  and oriented  Airway & Oxygen Therapy: Patient Spontanous Breathing and Patient connected to face mask oxygen  Post-op Assessment: Report given to RN and Post -op Vital signs reviewed and stable  Post vital signs: Reviewed and stable  Last Vitals:  Filed Vitals:   03/23/15 0610  BP: 153/61  Pulse: 71  Temp: 36.1 C  Resp: 16    Complications: No apparent anesthesia complications

## 2015-03-23 NOTE — Anesthesia Preprocedure Evaluation (Signed)
Anesthesia Evaluation  Patient identified by MRN, date of birth, ID band Patient awake    Reviewed: Allergy & Precautions, NPO status , Patient's Chart, lab work & pertinent test results  History of Anesthesia Complications (+) PONV  Airway Mallampati: II       Dental no notable dental hx.    Pulmonary    Pulmonary exam normal        Cardiovascular hypertension, Pt. on medications  Rhythm:Regular Rate:Normal     Neuro/Psych Depression    GI/Hepatic Neg liver ROS, GERD  ,  Endo/Other  Hypothyroidism   Renal/GU negative Renal ROS     Musculoskeletal   Abdominal Normal abdominal exam  (+)   Peds  Hematology  (+) anemia ,   Anesthesia Other Findings   Reproductive/Obstetrics                             Anesthesia Physical Anesthesia Plan  ASA: II  Anesthesia Plan: General   Post-op Pain Management:    Induction: Intravenous  Airway Management Planned: LMA  Additional Equipment:   Intra-op Plan:   Post-operative Plan: Extubation in OR  Informed Consent: I have reviewed the patients History and Physical, chart, labs and discussed the procedure including the risks, benefits and alternatives for the proposed anesthesia with the patient or authorized representative who has indicated his/her understanding and acceptance.     Plan Discussed with: CRNA  Anesthesia Plan Comments:         Anesthesia Quick Evaluation

## 2015-03-23 NOTE — Discharge Instructions (Addendum)
Work on range of motion with physical therapy, remove bandage and rewrap Ace wrap if the bandage slides down the leg. Medicine as directed.    General Anesthesia, Adult, Care After Refer to this sheet in the next few weeks. These instructions provide you with information on caring for yourself after your procedure. Your health care provider may also give you more specific instructions. Your treatment has been planned according to current medical practices, but problems sometimes occur. Call your health care provider if you have any problems or questions after your procedure. WHAT TO EXPECT AFTER THE PROCEDURE After the procedure, it is typical to experience:  Sleepiness.  Nausea and vomiting. HOME CARE INSTRUCTIONS  For the first 24 hours after general anesthesia:  Have a responsible person with you.  Do not drive a car. If you are alone, do not take public transportation.  Do not drink alcohol.  Do not take medicine that has not been prescribed by your health care provider.  Do not sign important papers or make important decisions.  You may resume a normal diet and activities as directed by your health care provider.  Change bandages (dressings) as directed.  If you have questions or problems that seem related to general anesthesia, call the hospital and ask for the anesthetist or anesthesiologist on call. SEEK MEDICAL CARE IF:  You have nausea and vomiting that continue the day after anesthesia.  You develop a rash. SEEK IMMEDIATE MEDICAL CARE IF:   You have difficulty breathing.  You have chest pain.  You have any allergic problems.   This information is not intended to replace advice given to you by your health care provider. Make sure you discuss any questions you have with your health care provider.   Document Released: 06/24/2000 Document Revised: 04/08/2014 Document Reviewed: 07/17/2011 Elsevier Interactive Patient Education [employer].

## 2015-04-05 ENCOUNTER — Telehealth: Payer: Self-pay | Admitting: Internal Medicine

## 2015-04-05 NOTE — Telephone Encounter (Signed)
Patient has labs completed per EPIC at the end of November, please advise and complete as needed.

## 2015-04-05 NOTE — Telephone Encounter (Signed)
Pt called wanting to know if she can get a [facility] req with the lab work that needs to be done so she can go to a [facility]? Pt will p/u form please call when it's ready. Thank You1

## 2015-04-05 NOTE — Telephone Encounter (Signed)
[facility] order signed and placed in your box.  Thanks

## 2015-04-06 NOTE — Telephone Encounter (Signed)
Rx placed up front up front for pick up

## 2015-04-07 ENCOUNTER — Other Ambulatory Visit: Payer: Self-pay | Admitting: Internal Medicine

## 2015-04-07 ENCOUNTER — Other Ambulatory Visit: Payer: Self-pay

## 2015-04-08 LAB — LIPID PANEL WITH LDL/HDL RATIO
CHOLESTEROL TOTAL: 214 mg/dL — AB (ref 100–199)
HDL: 52 mg/dL (ref >39)
LDL Calculated: 125 mg/dL — ABNORMAL HIGH (ref 0–99)
LDl/HDL Ratio: 2.4 ratio units (ref 0.0–3.2)
TRIGLYCERIDES: 186 mg/dL — AB (ref 0–149)
VLDL CHOLESTEROL CAL: 37 mg/dL (ref 5–40)

## 2015-04-08 LAB — HEPATIC FUNCTION PANEL
ALK PHOS: 61 IU/L (ref 39–117)
ALT: 11 IU/L (ref 0–32)
AST: 9 IU/L (ref 0–40)
Albumin: 4.2 g/dL (ref 3.5–5.5)
BILIRUBIN TOTAL: 0.4 mg/dL (ref 0.0–1.2)
BILIRUBIN, DIRECT: 0.11 mg/dL (ref 0.00–0.40)
Total Protein: 6.6 g/dL (ref 6.0–8.5)

## 2015-04-08 LAB — CBC WITH DIFFERENTIAL/PLATELET
BASOS: 1 %
Basophils Absolute: 0.1 10*3/uL (ref 0.0–0.2)
EOS (ABSOLUTE): 0.2 10*3/uL (ref 0.0–0.4)
EOS: 3 %
HEMATOCRIT: 40.4 % (ref 34.0–46.6)
Hemoglobin: 13.1 g/dL (ref 11.1–15.9)
IMMATURE GRANS (ABS): 0 10*3/uL (ref 0.0–0.1)
IMMATURE GRANULOCYTES: 0 %
LYMPHS: 30 %
Lymphocytes Absolute: 1.8 10*3/uL (ref 0.7–3.1)
MCH: 27.4 pg (ref 26.6–33.0)
MCHC: 32.4 g/dL (ref 31.5–35.7)
MCV: 85 fL (ref 79–97)
MONOS ABS: 0.4 10*3/uL (ref 0.1–0.9)
Monocytes: 7 %
NEUTROS PCT: 59 %
Neutrophils Absolute: 3.7 10*3/uL (ref 1.4–7.0)
PLATELETS: 300 10*3/uL (ref 150–379)
RBC: 4.78 x10E6/uL (ref 3.77–5.28)
RDW: 16.9 % — AB (ref 12.3–15.4)
WBC: 6.1 10*3/uL (ref 3.4–10.8)

## 2015-04-08 LAB — BASIC METABOLIC PANEL
BUN/Creatinine Ratio: 32 — ABNORMAL HIGH (ref 9–23)
BUN: 20 mg/dL (ref 6–24)
CALCIUM: 9.3 mg/dL (ref 8.7–10.2)
CHLORIDE: 95 mmol/L — AB (ref 96–106)
CO2: 27 mmol/L (ref 18–29)
Creatinine, Ser: 0.63 mg/dL (ref 0.57–1.00)
GFR calc non Af Amer: 106 mL/min/{1.73_m2} (ref >59)
GFR, EST AFRICAN AMERICAN: 122 mL/min/{1.73_m2} (ref >59)
Glucose: 91 mg/dL (ref 65–99)
POTASSIUM: 3.8 mmol/L (ref 3.5–5.2)
Sodium: 140 mmol/L (ref 134–144)

## 2015-04-08 LAB — HGB A1C W/O EAG: Hgb A1c MFr Bld: 5.4 % (ref 4.8–5.6)

## 2015-04-08 LAB — FERRITIN: Ferritin: 42 ng/mL (ref 15–150)

## 2015-04-09 ENCOUNTER — Encounter: Payer: Self-pay | Admitting: Internal Medicine

## 2015-04-24 DIAGNOSIS — M1710 Unilateral primary osteoarthritis, unspecified knee: Secondary | ICD-10-CM | POA: Insufficient documentation

## 2015-04-24 DIAGNOSIS — M179 Osteoarthritis of knee, unspecified: Secondary | ICD-10-CM | POA: Insufficient documentation

## 2015-04-24 DIAGNOSIS — M75100 Unspecified rotator cuff tear or rupture of unspecified shoulder, not specified as traumatic: Secondary | ICD-10-CM | POA: Insufficient documentation

## 2015-04-24 DIAGNOSIS — M169 Osteoarthritis of hip, unspecified: Secondary | ICD-10-CM | POA: Insufficient documentation

## 2015-04-25 ENCOUNTER — Ambulatory Visit (INDEPENDENT_AMBULATORY_CARE_PROVIDER_SITE_OTHER): Payer: 59 | Admitting: Obstetrics and Gynecology

## 2015-04-25 ENCOUNTER — Encounter: Payer: Self-pay | Admitting: Obstetrics and Gynecology

## 2015-04-25 VITALS — BP 109/69 | HR 74 | Ht 67.0 in | Wt 209.0 lb

## 2015-04-25 DIAGNOSIS — Z01419 Encounter for gynecological examination (general) (routine) without abnormal findings: Secondary | ICD-10-CM | POA: Diagnosis not present

## 2015-04-25 DIAGNOSIS — Z1239 Encounter for other screening for malignant neoplasm of breast: Secondary | ICD-10-CM | POA: Diagnosis not present

## 2015-04-25 NOTE — Progress Notes (Signed)
ANNUAL PREVENTATIVE CARE GYN  ENCOUNTER NOTE  Subjective:       Lori Le is a 50 y.o. P0 female here for a routine annual gynecologic exam.  Current complaints: none.  Does report recent left knee surgery x 2.  The patient is not currently sexually active.  The patient does wear seatbelts.  She denies history of tattoos or blood transfusions.     Gynecologic History Patient's last menstrual period was 04/04/2015. Contraception: IUD (Paraguard) Last Pap: 12/2013. Results were: normal.  Denies h/o abnormal pap smears.  Last mammogram: 03/2014. Results were: normal Denies h/o STIs.   Obstetric History OB History  No data available    Past Medical History  Diagnosis Date  . Hyperlipidemia   . Allergy   . GERD (gastroesophageal reflux disease)   . Arthritis   . Melanoma in situ ([place])     left shoulder  . History of eating disorder   . Migraine   . Hypothyroidism   . PCOS (polycystic ovarian syndrome)   . Gout   . Lower extremity edema   . Complication of anesthesia     nausea, slow to wake up  . PONV (postoperative nausea and vomiting)     Past Surgical History  Procedure Laterality Date  . Cholecystectomy  2003  . Foot surgery  1994  . Gastric bypass  2011  . Laparoscopic gastric banding  2010  . Knee arthroscopy Right   . 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 Total knee arthroplasty Left 12/01/2014    Procedure: TOTAL KNEE ARTHROPLASTY;  Surgeon: Hessie Knows, MD;  Location: [facility];  Service: Orthopedics;  Laterality: Left;  . Knee arthroscopy Left 03/23/2015    Procedure: ARTHROSCOPY KNEE, partial synovectomy;  Surgeon: Hessie Knows, MD;  Location: [facility];  Service: Orthopedics;  Laterality: Left;    Current Outpatient Prescriptions on File Prior to Visit  Medication Sig Dispense Refill  . Fe  Fum-FePoly-Vit C-Vit B3 (INTEGRA) 62.5-62.5-40-3 MG CAPS Take 1 capsule by mouth daily. 30 capsule 2  . furosemide (LASIX) 40 MG tablet TAKE 1 TABLET BY MOUTH EVERY DAY 90 tablet 1  . KLOR-CON 10 10 MEQ tablet TAKE ONE TABLET TWICE A DAY FOR TWO DAYS AND THEN ONE TAB EVERY DAY. 30 tablet 0  . levothyroxine (SYNTHROID, LEVOTHROID) 137 MCG tablet TAKE ONE TABLET BY MOUTH ONCE DAILY BEFORE BREAKFAST 30 tablet 9  . omeprazole (PRILOSEC) 20 MG capsule Take 1 capsule (20 mg total) by mouth daily. (Patient taking differently: Take 20 mg by mouth daily. This is OTC medication.) 90 capsule 1  . triamterene-hydrochlorothiazide (MAXZIDE-25) 37.5-25 MG tablet TAKE 1 TABLET BY MOUTH EVERY DAY 90 tablet 1  . colchicine 0.6 MG tablet Take 1 tablet (0.6 mg total) by mouth 2 (two) times daily. (Patient not taking: Reported on 03/23/2015) 30 tablet 0   No current facility-administered medications on file prior to visit.    Allergies  Allergen Reactions  . Influenza Vaccines Hives  . Lactose Nausea And Vomiting    Can have yogurt Can not have milk and ice cream   . Lactose Intolerance (Gi) Nausea And Vomiting    Can have yogurt Can not have milk and ice cream    . Bacitracin-Neomycin-Polymyxin Rash  . Neosporin + Pain Relief Max St [Neomy-Bacit-Polymyx-Pramoxine] Rash    Social History  Social History  . Marital Status: Married    Spouse Name: N/A  . Number of Children: N/A  . Years of Education: N/A   Occupational History  . Not on file.   Social History Main Topics  . Smoking status: Never Smoker   . Smokeless tobacco: Never Used  . Alcohol Use: 0.0 - 0.6 oz/week    0-1 Glasses of wine, 0 Standard drinks or equivalent per week     Comment: occ.  . Drug Use: No  . Sexual Activity: No     Comment: Paragard   Other Topics Concern  . Not on file   Social History Narrative    Family History  Problem Relation Age of Onset  . Arthritis Mother   . Hyperlipidemia Mother   .  Hypertension Mother   . Diabetes Mother   . Arthritis Father   . Hyperlipidemia Father   . Hypertension Father   . Mental illness Father   . Diabetes Father   . Arthritis Maternal Grandmother   . Cancer Maternal Grandmother     breast cancer  . Hyperlipidemia Maternal Grandmother   . Hypertension Maternal Grandmother   . Arthritis Maternal Grandfather   . Hyperlipidemia Maternal Grandfather   . Hypertension Maternal Grandfather   . Heart disease Maternal Grandfather     heart attack  . Breast cancer      maternal and paternal grandmother    The following portions of the patient's history were reviewed and updated as appropriate: allergies, current medications, past family history, past medical history, past social history, past surgical history and problem list.  Review of Systems ROS Review of Systems - General ROS: negative for - chills, fatigue, fever, hot flashes, night sweats, weight gain or weight loss Psychological ROS: negative for - anxiety, decreased libido, depression, mood swings, physical abuse or sexual abuse Ophthalmic ROS: negative for - blurry vision, eye pain or loss of vision ENT ROS: negative for - headaches, hearing change, visual changes or vocal changes Allergy and Immunology ROS: negative for - hives, itchy/watery eyes or seasonal allergies Hematological and Lymphatic ROS: negative for - bleeding problems, bruising, swollen lymph nodes or weight loss Endocrine ROS: negative for - galactorrhea, hair pattern changes, hot flashes, malaise/lethargy, mood swings, palpitations, polydipsia/polyuria, skin changes, temperature intolerance or unexpected weight changes Breast ROS: negative for - new or changing breast lumps or nipple discharge Respiratory ROS: negative for - cough or shortness of breath Cardiovascular ROS: negative for - chest pain, irregular heartbeat, palpitations or shortness of breath Gastrointestinal ROS: no abdominal pain, change in bowel habits,  or black or bloody stools Genito-Urinary [id number] for prolonged menses (previously 7-10 days, now sometimes 10-14 days); no dysuria, trouble voiding, or hematuria Musculoskeletal ROS: negative for - joint pain or joint stiffness Neurological ROS: negative for - bowel and bladder control changes Dermatological ROS: negative for rash and skin lesion changes   Objective:   BP 109/69 mmHg  Pulse 74  Ht 5\' 7"  (1.702 m)  Wt 209 lb (94.802 kg)  BMI 32.73 kg/m2  LMP 04/04/2015 CONSTITUTIONAL: Well-developed, well-nourished female in no acute distress.  PSYCHIATRIC: Normal mood and affect. Normal behavior. Normal judgment and thought content. [place]: Alert and oriented to person, place, and time. Normal muscle tone coordination. No cranial nerve deficit noted. HENT:  Normocephalic, atraumatic, External right and left ear normal. Oropharynx is clear and moist EYES: Conjunctivae and EOM are normal. Pupils are equal, round, and reactive to light. No scleral icterus.  NECK: Normal  range of motion, supple, no masses.  Normal thyroid.  SKIN: Skin is warm and dry. No rash noted. Not diaphoretic. No erythema. No pallor. CARDIOVASCULAR: Normal heart rate noted, regular rhythm, no murmur. RESPIRATORY: Clear to auscultation bilaterally. Effort and breath sounds normal, no problems with respiration noted. BREASTS: Symmetric in size. No masses, skin changes, nipple drainage, or lymphadenopathy. ABDOMEN: Soft, normal bowel sounds, no distention noted.  No tenderness, rebound or guarding.  BLADDER: Normal PELVIC:  Bladder: no bladder distension noted  Vulva: normal appearing vulva with no masses, tenderness or lesions  Vagina: normal appearing vagina with normal color and discharge, no lesions  Cervix: normal appearing cervix without discharge or lesions; IUD strings visible 3 cm from cervical os  Uterus: uterus is normal size, shape, consistency and nontender  Adnexa: normal adnexa in size, nontender  and no masses  Rectal: normal rectal, no masses MUSCULOSKELETAL: Normal range of motion. No tenderness.  No cyanosis, clubbing, or edema.  2+ distal pulses. LYMPHATIC: No Axillary, Supraclavicular, or Inguinal Adenopathy.   Labs:  Lab Results  Component Value Date   WBC 6.1 04/07/2015   HGB 13.1 04/07/2015   HCT 40.4 04/07/2015   MCV 85 04/07/2015   PLT 300 04/07/2015   Lab Results  Component Value Date   CREATININE 0.63 04/07/2015   BUN 20 04/07/2015   NA 140 04/07/2015   K 3.8 04/07/2015   CL 95* 04/07/2015   CO2 27 04/07/2015   Lab Results  Component Value Date   ALT 11 04/07/2015   AST 9 04/07/2015   ALKPHOS 61 04/07/2015   BILITOT 0.4 04/07/2015    Lab Results  Component Value Date   CHOL 214* 04/07/2015   HDL 52 04/07/2015   LDLCALC 125* 04/07/2015   LDLDIRECT 121.0 11/16/2014   TRIG 186* 04/07/2015   CHOLHDL 4 11/16/2014   Lab Results  Component Value Date   TSH 1.40 02/28/2015     Assessment:   Annual gynecologic examination 50 y.o.  Prolonged menses Obesity 1  Plan:  Pap: Not needed.  Patient is up to date Mammogram: Ordered  Stool Guaiac Testing:  Not Indicated Labs: not done.  Has had recent bloodwork.   Contraception: IUD Pharmacist, hospital) Routine preventative health maintenance measures emphasized: Exercise/Diet/Weight control, Alcohol/Substance use risks and Stress Management Prolonged menses - unsure if related to perimenopausal status or IUD.  Patient notes that symptoms are not bothersome.  Will continue to monitor.  To f/u if bleeding becomes heavier or even more prolonged.  Return to [place], MD

## 2015-04-25 NOTE — Patient Instructions (Signed)
Preventive Care for Adults, Female A healthy lifestyle and preventive care can promote health and wellness. Preventive health guidelines for women include the following key practices.  A routine yearly physical is a good way to check with your health care provider about your health and preventive screening. It is a chance to share any concerns and updates on your health and to receive a thorough exam.  Visit your dentist for a routine exam and preventive care every 6 months. Brush your teeth twice a day and floss once a day. Good oral hygiene prevents tooth decay and gum disease.  The frequency of eye exams is based on your age, health, family medical history, use of contact lenses, and other factors. Follow your health care provider's recommendations for frequency of eye exams.  Eat a healthy diet. Foods like vegetables, fruits, whole grains, low-fat dairy products, and lean protein foods contain the nutrients you need without too many calories. Decrease your intake of foods high in solid fats, added sugars, and salt. Eat the right amount of calories for you.Get information about a proper diet from your health care provider, if necessary.  Regular physical exercise is one of the most important things you can do for your health. Most adults should get at least 150 minutes of moderate-intensity exercise (any activity that increases your heart rate and causes you to sweat) each week. In addition, most adults need muscle-strengthening exercises on 2 or more days a week.  Maintain a healthy weight. The body mass index (BMI) is a screening tool to identify possible weight problems. It provides an estimate of body fat based on height and weight. Your health care provider can find your BMI and can help you achieve or maintain a healthy weight.For adults 20 years and older:  A BMI below 18.5 is considered underweight.  A BMI of 18.5 to 24.9 is normal.  A BMI of 25 to 29.9 is considered  overweight.  A BMI of 30 and above is considered obese.  Maintain normal blood lipids and cholesterol levels by exercising and minimizing your intake of saturated fat. Eat a balanced diet with plenty of fruit and vegetables. Blood tests for lipids and cholesterol should begin at age 64 and be repeated every 5 years. If your lipid or cholesterol levels are high, you are over 50, or you are at high risk for heart disease, you may need your cholesterol levels checked more frequently.Ongoing high lipid and cholesterol levels should be treated with medicines if diet and exercise are not working.  If you smoke, find out from your health care provider how to quit. If you do not use tobacco, do not start.  Lung cancer screening is recommended for adults aged 52-80 years who are at high risk for developing lung cancer because of a history of smoking. A yearly low-dose CT scan of the lungs is recommended for people who have at least a 30-pack-year history of smoking and are a current smoker or have quit within the past 15 years. A pack year of smoking is smoking an average of 1 pack of cigarettes a day for 1 year (for example: 1 pack a day for 30 years or 2 packs a day for 15 years). Yearly screening should continue until the smoker has stopped smoking for at least 15 years. Yearly screening should be stopped for people who develop a health problem that would prevent them from having lung cancer treatment.  If you are pregnant, do not drink alcohol. If you are  breastfeeding, be very cautious about drinking alcohol. If you are not pregnant and choose to drink alcohol, do not have more than 1 drink per day. One drink is considered to be 12 ounces (355 mL) of beer, 5 ounces (148 mL) of wine, or 1.5 ounces (44 mL) of liquor.  Avoid use of street drugs. Do not share needles with anyone. Ask for help if you need support or instructions about stopping the use of drugs.  High blood pressure causes heart disease and  increases the risk of stroke. Your blood pressure should be checked at least every 1 to 2 years. Ongoing high blood pressure should be treated with medicines if weight loss and exercise do not work.  If you are 25-78 years old, ask your health care provider if you should take aspirin to prevent strokes.  Diabetes screening is done by taking a blood sample to check your blood glucose level after you have not eaten for a certain period of time (fasting). If you are not overweight and you do not have risk factors for diabetes, you should be screened once every 3 years starting at age 86. If you are overweight or obese and you are 3-87 years of age, you should be screened for diabetes every year as part of your cardiovascular risk assessment.  Breast cancer screening is essential preventive care for women. You should practice "breast self-awareness." This means understanding the normal appearance and feel of your breasts and may include breast self-examination. Any changes detected, no matter how small, should be reported to a health care provider. Women in their 66s and 30s should have a clinical breast exam (CBE) by a health care provider as part of a regular health exam every 1 to 3 years. After age 43, women should have a CBE every year. Starting at age 37, women should consider having a mammogram (breast X-ray test) every year. Women who have a family history of breast cancer should talk to their health care provider about genetic screening. Women at a high risk of breast cancer should talk to their health care providers about having an MRI and a mammogram every year.  Breast cancer gene (BRCA)-related cancer risk assessment is recommended for women who have family members with BRCA-related cancers. BRCA-related cancers include breast, ovarian, tubal, and peritoneal cancers. Having family members with these cancers may be associated with an increased risk for harmful changes (mutations) in the breast  cancer genes BRCA1 and BRCA2. Results of the assessment will determine the need for genetic counseling and BRCA1 and BRCA2 testing.  Your health care provider may recommend that you be screened regularly for cancer of the pelvic organs (ovaries, uterus, and vagina). This screening involves a pelvic examination, including checking for microscopic changes to the surface of your cervix (Pap test). You may be encouraged to have this screening done every 3 years, beginning at age 78.  For women ages 79-65, health care providers may recommend pelvic exams and Pap testing every 3 years, or they may recommend the Pap and pelvic exam, combined with testing for human papilloma virus (HPV), every 5 years. Some types of HPV increase your risk of cervical cancer. Testing for HPV may also be done on women of any age with unclear Pap test results.  Other health care providers may not recommend any screening for nonpregnant women who are considered low risk for pelvic cancer and who do not have symptoms. Ask your health care provider if a screening pelvic exam is right for  you.  If you have had past treatment for cervical cancer or a condition that could lead to cancer, you need Pap tests and screening for cancer for at least 20 years after your treatment. If Pap tests have been discontinued, your risk factors (such as having a new sexual partner) need to be reassessed to determine if screening should resume. Some women have medical problems that increase the chance of getting cervical cancer. In these cases, your health care provider may recommend more frequent screening and Pap tests.  Colorectal cancer can be detected and often prevented. Most routine colorectal cancer screening begins at the age of 50 years and continues through age 75 years. However, your health care provider may recommend screening at an earlier age if you have risk factors for colon cancer. On a yearly basis, your health care provider may provide  home test kits to check for hidden blood in the stool. Use of a small camera at the end of a tube, to directly examine the colon (sigmoidoscopy or colonoscopy), can detect the earliest forms of colorectal cancer. Talk to your health care provider about this at age 50, when routine screening begins. Direct exam of the colon should be repeated every 5-10 years through age 75 years, unless early forms of precancerous polyps or small growths are found.  People who are at an increased risk for hepatitis B should be screened for this virus. You are considered at high risk for hepatitis B if:  You were born in a country where hepatitis B occurs often. Talk with your health care provider about which countries are considered high risk.  Your parents were born in a high-risk country and you have not received a shot to protect against hepatitis B (hepatitis B vaccine).  You have HIV or AIDS.  You use needles to inject street drugs.  You live with, or have sex with, someone who has hepatitis B.  You get hemodialysis treatment.  You take certain medicines for conditions like cancer, organ transplantation, and autoimmune conditions.  Hepatitis C blood testing is recommended for all people born from 1945 through 1965 and any individual with known risks for hepatitis C.  Practice safe sex. Use condoms and avoid high-risk sexual practices to reduce the spread of sexually transmitted infections (STIs). STIs include gonorrhea, chlamydia, syphilis, trichomonas, herpes, HPV, and human immunodeficiency virus (HIV). Herpes, HIV, and HPV are viral illnesses that have no cure. They can result in disability, cancer, and death.  You should be screened for sexually transmitted illnesses (STIs) including gonorrhea and chlamydia if:  You are sexually active and are younger than 24 years.  You are older than 24 years and your health care provider tells you that you are at risk for this type of infection.  Your sexual  activity has changed since you were last screened and you are at an increased risk for chlamydia or gonorrhea. Ask your health care provider if you are at risk.  If you are at risk of being infected with HIV, it is recommended that you take a prescription medicine daily to prevent HIV infection. This is called preexposure prophylaxis (PrEP). You are considered at risk if:  You are sexually active and do not regularly use condoms or know the HIV status of your partner(s).  You take drugs by injection.  You are sexually active with a partner who has HIV.  Talk with your health care provider about whether you are at high risk of being infected with HIV. If   you choose to begin PrEP, you should first be tested for HIV. You should then be tested every 3 months for as long as you are taking PrEP.  Osteoporosis is a disease in which the bones lose minerals and strength with aging. This can result in serious bone fractures or breaks. The risk of osteoporosis can be identified using a bone density scan. Women ages 1 years and over and women at risk for fractures or osteoporosis should discuss screening with their health care providers. Ask your health care provider whether you should take a calcium supplement or vitamin D to reduce the rate of osteoporosis.  Menopause can be associated with physical symptoms and risks. Hormone replacement therapy is available to decrease symptoms and risks. You should talk to your health care provider about whether hormone replacement therapy is right for you.  Use sunscreen. Apply sunscreen liberally and repeatedly throughout the day. You should seek shade when your shadow is shorter than you. Protect yourself by wearing long sleeves, pants, a wide-brimmed hat, and sunglasses year round, whenever you are outdoors.  Once a month, do a whole body skin exam, using a mirror to look at the skin on your back. Tell your health care provider of new moles, moles that have irregular  borders, moles that are larger than a pencil eraser, or moles that have changed in shape or color.  Stay current with required vaccines (immunizations).  Influenza vaccine. All adults should be immunized every year.  Tetanus, diphtheria, and acellular pertussis (Td, Tdap) vaccine. Pregnant women should receive 1 dose of Tdap vaccine during each pregnancy. The dose should be obtained regardless of the length of time since the last dose. Immunization is preferred during the 27th-36th week of gestation. An adult who has not previously received Tdap or who does not know her vaccine status should receive 1 dose of Tdap. This initial dose should be followed by tetanus and diphtheria toxoids (Td) booster doses every 10 years. Adults with an unknown or incomplete history of completing a 3-dose immunization series with Td-containing vaccines should begin or complete a primary immunization series including a Tdap dose. Adults should receive a Td booster every 10 years.  Varicella vaccine. An adult without evidence of immunity to varicella should receive 2 doses or a second dose if she has previously received 1 dose. Pregnant females who do not have evidence of immunity should receive the first dose after pregnancy. This first dose should be obtained before leaving the health care facility. The second dose should be obtained 4-8 weeks after the first dose.  Human papillomavirus (HPV) vaccine. Females aged 13-26 years who have not received the vaccine previously should obtain the 3-dose series. The vaccine is not recommended for use in pregnant females. However, pregnancy testing is not needed before receiving a dose. If a female is found to be pregnant after receiving a dose, no treatment is needed. In that case, the remaining doses should be delayed until after the pregnancy. Immunization is recommended for any person with an immunocompromised condition through the age of 24 years if she did not get any or all doses  earlier. During the 3-dose series, the second dose should be obtained 4-8 weeks after the first dose. The third dose should be obtained 24 weeks after the first dose and 16 weeks after the second dose.  Zoster vaccine. One dose is recommended for adults aged 97 years or older unless certain conditions are present.  Measles, mumps, and rubella (MMR) vaccine. Adults born  before 1957 generally are considered immune to measles and mumps. Adults born in [birth year] or later should have 1 or more doses of MMR vaccine unless there is a contraindication to the vaccine or there is laboratory evidence of immunity to each of the three diseases. A routine second dose of MMR vaccine should be obtained at least 28 days after the first dose for students attending postsecondary schools, health care workers, or international travelers. People who received inactivated measles vaccine or an unknown type of measles vaccine during 1963-1967 should receive 2 doses of MMR vaccine. People who received inactivated mumps vaccine or an unknown type of mumps vaccine before 1979 and are at high risk for mumps infection should consider immunization with 2 doses of MMR vaccine. For females of childbearing age, rubella immunity should be determined. If there is no evidence of immunity, females who are not pregnant should be vaccinated. If there is no evidence of immunity, females who are pregnant should delay immunization until after pregnancy. Unvaccinated health care workers born before 60 who lack laboratory evidence of measles, mumps, or rubella immunity or laboratory confirmation of disease should consider measles and mumps immunization with 2 doses of MMR vaccine or rubella immunization with 1 dose of MMR vaccine.  Pneumococcal 13-valent conjugate (PCV13) vaccine. When indicated, a person who is uncertain of his immunization history and has no record of immunization should receive the PCV13 vaccine. All adults 61 years of age and older  should receive this vaccine. An adult aged 92 years or older who has certain medical conditions and has not been previously immunized should receive 1 dose of PCV13 vaccine. This PCV13 should be followed with a dose of pneumococcal polysaccharide (PPSV23) vaccine. Adults who are at high risk for pneumococcal disease should obtain the PPSV23 vaccine at least 8 weeks after the dose of PCV13 vaccine. Adults older than 50 years of age who have normal immune system function should obtain the PPSV23 vaccine dose at least 1 year after the dose of PCV13 vaccine.  Pneumococcal polysaccharide (PPSV23) vaccine. When PCV13 is also indicated, PCV13 should be obtained first. All adults aged 2 years and older should be immunized. An adult younger than age 30 years who has certain medical conditions should be immunized. Any person who resides in a nursing home or long-term care facility should be immunized. An adult smoker should be immunized. People with an immunocompromised condition and certain other conditions should receive both PCV13 and PPSV23 vaccines. People with human immunodeficiency virus (HIV) infection should be immunized as soon as possible after diagnosis. Immunization during chemotherapy or radiation therapy should be avoided. Routine use of PPSV23 vaccine is not recommended for American Indians, [place] Natives, or people younger than 65 years unless there are medical conditions that require PPSV23 vaccine. When indicated, people who have unknown immunization and have no record of immunization should receive PPSV23 vaccine. One-time revaccination 5 years after the first dose of PPSV23 is recommended for people aged 19-64 years who have chronic kidney failure, nephrotic syndrome, asplenia, or immunocompromised conditions. People who received 1-2 doses of PPSV23 before age 44 years should receive another dose of PPSV23 vaccine at age 83 years or later if at least 5 years have passed since the previous dose. Doses  of PPSV23 are not needed for people immunized with PPSV23 at or after age 20 years.  Meningococcal vaccine. Adults with asplenia or persistent complement component deficiencies should receive 2 doses of quadrivalent meningococcal conjugate (MenACWY-D) vaccine. The doses should be obtained  at least 2 months apart. Microbiologists working with certain meningococcal bacteria, [place] recruits, people at risk during an outbreak, and people who travel to or live in countries with a high rate of meningitis should be immunized. A first-year college student up through age 28 years who is living in a residence hall should receive a dose if she did not receive a dose on or after her 16th birthday. Adults who have certain high-risk conditions should receive one or more doses of vaccine.  Hepatitis A vaccine. Adults who wish to be protected from this disease, have certain high-risk conditions, work with hepatitis A-infected animals, work in hepatitis A research labs, or travel to or work in countries with a high rate of hepatitis A should be immunized. Adults who were previously unvaccinated and who anticipate close contact with an international adoptee during the first 60 days after arrival in the [place] from a country with a high rate of hepatitis A should be immunized.  Hepatitis B vaccine. Adults who wish to be protected from this disease, have certain high-risk conditions, may be exposed to blood or other infectious body fluids, are household contacts or sex partners of hepatitis B positive people, are clients or workers in certain care facilities, or travel to or work in countries with a high rate of hepatitis B should be immunized.  Haemophilus influenzae type b (Hib) vaccine. A previously unvaccinated person with asplenia or sickle cell disease or having a scheduled splenectomy should receive 1 dose of Hib vaccine. Regardless of previous immunization, a recipient of a hematopoietic stem cell transplant  should receive a 3-dose series 6-12 months after her successful transplant. Hib vaccine is not recommended for adults with HIV infection. Preventive Services / Frequency Ages 71 to 87 years  Blood pressure check.** / Every 3-5 years.  Lipid and cholesterol check.** / Every 5 years beginning at age 1.  Clinical breast exam.** / Every 3 years for women in their 3s and 31s.  BRCA-related cancer risk assessment.** / For women who have family members with a BRCA-related cancer (breast, ovarian, tubal, or peritoneal cancers).  Pap test.** / Every 2 years from ages 50 through 86. Every 3 years starting at age 87 through age 7 or 75 with a history of 3 consecutive normal Pap tests.  HPV screening.** / Every 3 years from ages 59 through ages 35 to 6 with a history of 3 consecutive normal Pap tests.  Hepatitis C blood test.** / For any individual with known risks for hepatitis C.  Skin self-exam. / Monthly.  Influenza vaccine. / Every year.  Tetanus, diphtheria, and acellular pertussis (Tdap, Td) vaccine.** / Consult your health care provider. Pregnant women should receive 1 dose of Tdap vaccine during each pregnancy. 1 dose of Td every 10 years.  Varicella vaccine.** / Consult your health care provider. Pregnant females who do not have evidence of immunity should receive the first dose after pregnancy.  HPV vaccine. / 3 doses over 6 months, if 72 and younger. The vaccine is not recommended for use in pregnant females. However, pregnancy testing is not needed before receiving a dose.  Measles, mumps, rubella (MMR) vaccine.** / You need at least 1 dose of MMR if you were born in 1957 or later. You may also need a 2nd dose. For females of childbearing age, rubella immunity should be determined. If there is no evidence of immunity, females who are not pregnant should be vaccinated. If there is no evidence of immunity, females who are  pregnant should delay immunization until after  pregnancy.  Pneumococcal 13-valent conjugate (PCV13) vaccine.** / Consult your health care provider.  Pneumococcal polysaccharide (PPSV23) vaccine.** / 1 to 2 doses if you smoke cigarettes or if you have certain conditions.  Meningococcal vaccine.** / 1 dose if you are age 87 to 44 years and a Market researcher living in a residence hall, or have one of several medical conditions, you need to get vaccinated against meningococcal disease. You may also need additional booster doses.  Hepatitis A vaccine.** / Consult your health care provider.  Hepatitis B vaccine.** / Consult your health care provider.  Haemophilus influenzae type b (Hib) vaccine.** / Consult your health care provider. Ages 86 to 38 years  Blood pressure check.** / Every year.  Lipid and cholesterol check.** / Every 5 years beginning at age 49 years.  Lung cancer screening. / Every year if you are aged 71-80 years and have a 30-pack-year history of smoking and currently smoke or have quit within the past 15 years. Yearly screening is stopped once you have quit smoking for at least 15 years or develop a health problem that would prevent you from having lung cancer treatment.  Clinical breast exam.** / Every year after age 51 years.  BRCA-related cancer risk assessment.** / For women who have family members with a BRCA-related cancer (breast, ovarian, tubal, or peritoneal cancers).  Mammogram.** / Every year beginning at age 18 years and continuing for as long as you are in good health. Consult with your health care provider.  Pap test.** / Every 3 years starting at age 63 years through age 37 or 57 years with a history of 3 consecutive normal Pap tests.  HPV screening.** / Every 3 years from ages 41 years through ages 76 to 23 years with a history of 3 consecutive normal Pap tests.  Fecal occult blood test (FOBT) of stool. / Every year beginning at age 36 years and continuing until age 51 years. You may not need  to do this test if you get a colonoscopy every 10 years.  Flexible sigmoidoscopy or colonoscopy.** / Every 5 years for a flexible sigmoidoscopy or every 10 years for a colonoscopy beginning at age 36 years and continuing until age 35 years.  Hepatitis C blood test.** / For all people born from [birth year] through 1965 and any individual with known risks for hepatitis C.  Skin self-exam. / Monthly.  Influenza vaccine. / Every year.  Tetanus, diphtheria, and acellular pertussis (Tdap/Td) vaccine.** / Consult your health care provider. Pregnant women should receive 1 dose of Tdap vaccine during each pregnancy. 1 dose of Td every 10 years.  Varicella vaccine.** / Consult your health care provider. Pregnant females who do not have evidence of immunity should receive the first dose after pregnancy.  Zoster vaccine.** / 1 dose for adults aged 73 years or older.  Measles, mumps, rubella (MMR) vaccine.** / You need at least 1 dose of MMR if you were born in 1957 or later. You may also need a second dose. For females of childbearing age, rubella immunity should be determined. If there is no evidence of immunity, females who are not pregnant should be vaccinated. If there is no evidence of immunity, females who are pregnant should delay immunization until after pregnancy.  Pneumococcal 13-valent conjugate (PCV13) vaccine.** / Consult your health care provider.  Pneumococcal polysaccharide (PPSV23) vaccine.** / 1 to 2 doses if you smoke cigarettes or if you have certain conditions.  Meningococcal vaccine.** /  Consult your health care provider.  Hepatitis A vaccine.** / Consult your health care provider.  Hepatitis B vaccine.** / Consult your health care provider.  Haemophilus influenzae type b (Hib) vaccine.** / Consult your health care provider. Ages 80 years and over  Blood pressure check.** / Every year.  Lipid and cholesterol check.** / Every 5 years beginning at age 62 years.  Lung cancer  screening. / Every year if you are aged 32-80 years and have a 30-pack-year history of smoking and currently smoke or have quit within the past 15 years. Yearly screening is stopped once you have quit smoking for at least 15 years or develop a health problem that would prevent you from having lung cancer treatment.  Clinical breast exam.** / Every year after age 61 years.  BRCA-related cancer risk assessment.** / For women who have family members with a BRCA-related cancer (breast, ovarian, tubal, or peritoneal cancers).  Mammogram.** / Every year beginning at age 39 years and continuing for as long as you are in good health. Consult with your health care provider.  Pap test.** / Every 3 years starting at age 85 years through age 74 or 72 years with 3 consecutive normal Pap tests. Testing can be stopped between 65 and 70 years with 3 consecutive normal Pap tests and no abnormal Pap or HPV tests in the past 10 years.  HPV screening.** / Every 3 years from ages 55 years through ages 67 or 77 years with a history of 3 consecutive normal Pap tests. Testing can be stopped between 65 and 70 years with 3 consecutive normal Pap tests and no abnormal Pap or HPV tests in the past 10 years.  Fecal occult blood test (FOBT) of stool. / Every year beginning at age 81 years and continuing until age 22 years. You may not need to do this test if you get a colonoscopy every 10 years.  Flexible sigmoidoscopy or colonoscopy.** / Every 5 years for a flexible sigmoidoscopy or every 10 years for a colonoscopy beginning at age 67 years and continuing until age 22 years.  Hepatitis C blood test.** / For all people born from [birth year] through 1965 and any individual with known risks for hepatitis C.  Osteoporosis screening.** / A one-time screening for women ages 8 years and over and women at risk for fractures or osteoporosis.  Skin self-exam. / Monthly.  Influenza vaccine. / Every year.  Tetanus, diphtheria, and  acellular pertussis (Tdap/Td) vaccine.** / 1 dose of Td every 10 years.  Varicella vaccine.** / Consult your health care provider.  Zoster vaccine.** / 1 dose for adults aged 56 years or older.  Pneumococcal 13-valent conjugate (PCV13) vaccine.** / Consult your health care provider.  Pneumococcal polysaccharide (PPSV23) vaccine.** / 1 dose for all adults aged 15 years and older.  Meningococcal vaccine.** / Consult your health care provider.  Hepatitis A vaccine.** / Consult your health care provider.  Hepatitis B vaccine.** / Consult your health care provider.  Haemophilus influenzae type b (Hib) vaccine.** / Consult your health care provider. ** Family history and personal history of risk and conditions may change your health care provider's recommendations.   This information is not intended to replace advice given to you by your health care provider. Make sure you discuss any questions you have with your health care provider.   Document Released: 05/14/2001 Document Revised: 04/08/2014 Document Reviewed: 08/13/2010 Elsevier Interactive Patient Education [employer].

## 2015-05-01 ENCOUNTER — Other Ambulatory Visit: Payer: Self-pay | Admitting: Orthopedic Surgery

## 2015-05-01 DIAGNOSIS — M75122 Complete rotator cuff tear or rupture of left shoulder, not specified as traumatic: Secondary | ICD-10-CM

## 2015-05-16 ENCOUNTER — Other Ambulatory Visit: Payer: Self-pay | Admitting: Internal Medicine

## 2015-05-19 ENCOUNTER — Ambulatory Visit
Admission: RE | Admit: 2015-05-19 | Discharge: 2015-05-19 | Disposition: A | Discharge status: Home / Self Care | Payer: 59 | Source: Ambulatory Visit | Attending: Orthopedic Surgery | Admitting: Orthopedic Surgery

## 2015-05-19 DIAGNOSIS — M75122 Complete rotator cuff tear or rupture of left shoulder, not specified as traumatic: Secondary | ICD-10-CM | POA: Insufficient documentation

## 2015-05-19 DIAGNOSIS — M7552 Bursitis of left shoulder: Secondary | ICD-10-CM | POA: Diagnosis not present

## 2015-05-19 DIAGNOSIS — M19012 Primary osteoarthritis, left shoulder: Secondary | ICD-10-CM | POA: Diagnosis not present

## 2015-05-25 DIAGNOSIS — M75121 Complete rotator cuff tear or rupture of right shoulder, not specified as traumatic: Secondary | ICD-10-CM | POA: Insufficient documentation

## 2015-06-12 ENCOUNTER — Encounter
Admission: RE | Admit: 2015-06-12 | Discharge: 2015-06-12 | Disposition: A | Discharge status: Home / Self Care | Payer: 59 | Source: Ambulatory Visit | Attending: Surgery | Admitting: Surgery

## 2015-06-12 DIAGNOSIS — Z01812 Encounter for preprocedural laboratory examination: Secondary | ICD-10-CM | POA: Insufficient documentation

## 2015-06-12 LAB — POTASSIUM: Potassium: 3.6 mmol/L (ref 3.5–5.1)

## 2015-06-12 NOTE — Patient Instructions (Signed)
  Your procedure is scheduled on: Tuesday 06/20/15 Report to Day Surgery. 2ND FLOOR MEDICAL MALL ENTRANCE To find out your arrival time please call [phone] between 1PM - 3PM on Monday 06/19/15.  Remember: Instructions that are not followed completely may result in serious medical risk, up to and including death, or upon the discretion of your surgeon and anesthesiologist your surgery may need to be rescheduled.    __X__ 1. Do not eat food or drink liquids after midnight. No gum chewing or hard candies.     __X__ 2. No Alcohol for 24 hours before or after surgery.   ____ 3. Bring all medications with you on the day of surgery if instructed.    __X__ 4. Notify your doctor if there is any change in your medical condition     (cold, fever, infections).     Do not wear jewelry, make-up, hairpins, clips or nail polish.  Do not wear lotions, powders, or perfumes.   Do not shave 48 hours prior to surgery. Men may shave face and neck.  Do not bring valuables to the hospital.    [facility] is not responsible for any belongings or valuables.               Contacts, dentures or bridgework may not be worn into surgery.  Leave your suitcase in the car. After surgery it may be brought to your room.  For patients admitted to the hospital, discharge time is determined by your                treatment team.   Patients discharged the day of surgery will not be allowed to drive home.   Please read over the following fact sheets that you were given:   Surgical Site Infection Prevention   __X__ Take these medicines the morning of surgery with A SIP OF WATER:    1. LEVOTHYROXINE  2. OMEPRAZOLE AT BEDTIME  NIGHT BEFORE AND AGAIN MORNING OF SURGERY  3.   4.  5.  6.  ____ Fleet Enema (as directed)   __X__ Use CHG Soap as directed  ____ Use inhalers on the day of surgery  ____ Stop metformin 2 days prior to surgery    ____ Take 1/2 of usual insulin dose the night before surgery and none on  the morning of surgery.   ____ Stop Coumadin/Plavix/aspirin on   ____ Stop Anti-inflammatories on    ____ Stop supplements until after surgery.    ____ Bring C-Pap to the hospital.

## 2015-06-19 ENCOUNTER — Ambulatory Visit (INDEPENDENT_AMBULATORY_CARE_PROVIDER_SITE_OTHER): Payer: 59 | Admitting: Internal Medicine

## 2015-06-19 ENCOUNTER — Encounter: Payer: Self-pay | Admitting: Internal Medicine

## 2015-06-19 VITALS — BP 120/70 | HR 74 | Temp 98.1°F | Resp 18 | Ht 67.0 in | Wt 214.1 lb

## 2015-06-19 DIAGNOSIS — I10 Essential (primary) hypertension: Secondary | ICD-10-CM | POA: Diagnosis not present

## 2015-06-19 DIAGNOSIS — D649 Anemia, unspecified: Secondary | ICD-10-CM

## 2015-06-19 DIAGNOSIS — E669 Obesity, unspecified: Secondary | ICD-10-CM

## 2015-06-19 DIAGNOSIS — M1711 Unilateral primary osteoarthritis, right knee: Secondary | ICD-10-CM

## 2015-06-19 DIAGNOSIS — E039 Hypothyroidism, unspecified: Secondary | ICD-10-CM

## 2015-06-19 DIAGNOSIS — E7800 Pure hypercholesterolemia, unspecified: Secondary | ICD-10-CM

## 2015-06-19 DIAGNOSIS — R609 Edema, unspecified: Secondary | ICD-10-CM

## 2015-06-19 DIAGNOSIS — K219 Gastro-esophageal reflux disease without esophagitis: Secondary | ICD-10-CM | POA: Diagnosis not present

## 2015-06-19 DIAGNOSIS — F32A Depression, unspecified: Secondary | ICD-10-CM

## 2015-06-19 DIAGNOSIS — M75102 Unspecified rotator cuff tear or rupture of left shoulder, not specified as traumatic: Secondary | ICD-10-CM

## 2015-06-19 DIAGNOSIS — F329 Major depressive disorder, single episode, unspecified: Secondary | ICD-10-CM

## 2015-06-19 DIAGNOSIS — E538 Deficiency of other specified B group vitamins: Secondary | ICD-10-CM

## 2015-06-19 DIAGNOSIS — Z9889 Other specified postprocedural states: Secondary | ICD-10-CM

## 2015-06-19 DIAGNOSIS — Z9884 Bariatric surgery status: Secondary | ICD-10-CM

## 2015-06-19 NOTE — Progress Notes (Signed)
Patient ID: [name], female   DOB: [birth year], 50 y.o.   MRN: [id number]   Subjective:    Patient ID: Lori Le, female    DOB: December 25, 1965, 50 y.o.   MRN: [id number]  HPI  Patient here for her physical exam.  She actual saw Dr Marcelline Mates 04/2014.  Had her breast exam, pelvic exam then.  Here to f/u on her current medical issues.  She was seen by rheumatology.  Did not feel she had seronegative arthritis.  Per pt, was questioning marfan's syndrome.  Has appt with geneticist.  She has been having problems with her left shoulder - left rotator cuff.  Planning for shoulder surgery tomorrow.  Seeing Dr Roland Rack.  She also has been having some increased right knee pain.  Found to have OA.  Was seen by ortho last week.  S/p injection.  Recommended f/u in two weeks if no improvement.  If walks pigeon toed - better.  Still with increased stress with her work and family issues.  Overall she feels she is handling things relatively well.  Does not feel needs any further intervention.  She denies any chest pain or tightness.  No sob.  No acid reflux.  No abdominal pain or cramping.  Some minimal constipation.     Past Medical History  Diagnosis Date  . Hyperlipidemia   . Allergy   . GERD (gastroesophageal reflux disease)   . Arthritis   . Melanoma in situ ([place])     left shoulder  . History of eating disorder   . Migraine   . Hypothyroidism   . PCOS (polycystic ovarian syndrome)   . Gout   . Lower extremity edema   . Complication of anesthesia     nausea, slow to wake up  . PONV (postoperative nausea and vomiting)    Past Surgical History  Procedure Laterality Date  . Cholecystectomy  2003  . Foot surgery  1994  . Gastric bypass  2011  . Laparoscopic gastric banding  2010  . Knee arthroscopy Right   . 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  Total knee arthroplasty Left 12/01/2014    Procedure: TOTAL KNEE ARTHROPLASTY;  Surgeon: Hessie Knows, MD;  Location: [facility] ORS;  Service: Orthopedics;  Laterality: Left;  . Knee arthroscopy Left 03/23/2015    Procedure: ARTHROSCOPY KNEE, partial synovectomy;  Surgeon: Hessie Knows, MD;  Location: [facility] ORS;  Service: Orthopedics;  Laterality: Left;   Family History  Problem Relation Age of Onset  . Arthritis Mother   . Hyperlipidemia Mother   . Hypertension Mother   . Diabetes Mother   . Arthritis Father   . Hyperlipidemia Father   . Hypertension Father   . Mental illness Father   . Diabetes Father   . Arthritis Maternal Grandmother   . Cancer Maternal Grandmother     breast cancer  . Hyperlipidemia Maternal Grandmother   . Hypertension Maternal Grandmother   . Arthritis Maternal Grandfather   . Hyperlipidemia Maternal Grandfather   . Hypertension Maternal Grandfather   . Heart disease Maternal Grandfather     heart attack  . Breast cancer      maternal and paternal grandmother   Social History   Social History  . Marital Status: Married    Spouse Name: N/A  . Number of Children: N/A  . Years of Education:  N/A   Social History Main Topics  . Smoking status: Never Smoker   . Smokeless tobacco: Never Used  . Alcohol Use: 0.0 - 0.6 oz/week    0-1 Glasses of wine, 0 Standard drinks or equivalent per week     Comment: occ.  . Drug Use: No  . Sexual Activity: No     Comment: [place]   Other Topics Concern  . None   Social History Narrative    Outpatient Encounter Prescriptions as of 06/19/2015  Medication Sig  . amoxicillin (AMOXIL) 500 MG capsule TAKE 4 CAPSULES BY MOUTH ONE HOUR BEFORE SCHEDULED PROCEDURE  . Dermatological Products, Misc. (NUVAIL) SOLN Reported on 06/12/2015  . Emollient (CERAVE) CREA   . Fe Fum-FePoly-Vit C-Vit B3 (INTEGRA) 62.5-62.5-40-3 MG CAPS TAKE 1 CAPSULE BY MOUTH DAILY.  . furosemide (LASIX) 40 MG tablet TAKE 1 TABLET BY MOUTH EVERY DAY  .  KLOR-CON 10 10 MEQ tablet TAKE ONE TABLET TWICE A DAY FOR TWO DAYS AND THEN ONE TAB EVERY DAY.  [name] levothyroxine (SYNTHROID, LEVOTHROID) 137 MCG tablet TAKE ONE TABLET BY MOUTH ONCE DAILY BEFORE BREAKFAST  . nabumetone (RELAFEN) 750 MG tablet Take by mouth. Reported on 06/12/2015  . omeprazole (PRILOSEC) 20 MG capsule Take 1 capsule (20 mg total) by mouth daily. (Patient taking differently: Take 20 mg by mouth daily. This is OTC medication.)  . triamcinolone (KENALOG) 0.025 % cream Reported on 06/12/2015  . triamterene-hydrochlorothiazide (MAXZIDE-25) 37.5-25 MG tablet TAKE 1 TABLET BY MOUTH EVERY DAY  . [DISCONTINUED] colchicine 0.6 MG tablet Take 1 tablet (0.6 mg total) by mouth 2 (two) times daily. (Patient not taking: Reported on 03/23/2015)   No facility-administered encounter medications on file as of 06/19/2015.    Review of Systems  Constitutional: Negative for appetite change and unexpected weight change.  HENT: Negative for congestion and sinus pressure.   Eyes: Negative for pain and visual disturbance.  Respiratory: Negative for cough, chest tightness and shortness of breath.   Cardiovascular: Negative for chest pain and palpitations.       Has had some leg swelling.  Better now.    Gastrointestinal: Negative for nausea, vomiting, abdominal pain and diarrhea.       Minimal constipation.   Genitourinary: Negative for dysuria and difficulty urinating.  Musculoskeletal:       Increased pain - left shoulder.  Limited rom.  Increased pain - right knee.    Skin: Negative for color change and rash.  Neurological: Negative for dizziness, light-headedness and headaches.  Hematological: Negative for adenopathy. Does not bruise/bleed easily.  Psychiatric/Behavioral: Negative for dysphoric mood and agitation.       Objective:    Physical Exam  Constitutional: She appears well-developed and well-nourished. No distress.  HENT:  Nose: Nose normal.  Mouth/Throat: Oropharynx is clear and  moist.  Eyes: Conjunctivae are normal. Right eye exhibits no discharge. Left eye exhibits no discharge.  Neck: Neck supple. No thyromegaly present.  Cardiovascular: Normal rate and regular rhythm.   Pulmonary/Chest: Breath sounds normal. No respiratory distress. She has no wheezes.  Abdominal: Soft. Bowel sounds are normal. There is no tenderness.  Musculoskeletal: She exhibits no edema or tenderness.  Lymphadenopathy:    She has no cervical adenopathy.  Skin: No rash noted. No erythema.  Psychiatric: She has a normal mood and affect. Her behavior is normal.    BP 120/70 mmHg  Pulse 74  Temp(Src) 98.1 F (36.7 C) (Oral)  Resp 18  Ht 5\' 7"  (1.702 m)  Wt  214 lb 2 oz (97.126 kg)  BMI 33.53 kg/m2  SpO2 98%  LMP 05/29/2015 (Approximate) Wt Readings from Last 3 Encounters:  06/19/15 214 lb 2 oz (97.126 kg)  06/12/15 215 lb (97.523 kg)  04/25/15 209 lb (94.802 kg)     Lab Results  Component Value Date   WBC 6.1 04/07/2015   HGB 11.8* 02/28/2015   HCT 40.4 04/07/2015   PLT 300 04/07/2015   GLUCOSE 91 04/07/2015   CHOL 214* 04/07/2015   TRIG 186* 04/07/2015   HDL 52 04/07/2015   LDLDIRECT 121.0 11/16/2014   LDLCALC 125* 04/07/2015   ALT 11 04/07/2015   AST 9 04/07/2015   NA 140 04/07/2015   K 3.6 06/12/2015   CL 95* 04/07/2015   CREATININE 0.63 04/07/2015   BUN 20 04/07/2015   CO2 27 04/07/2015   TSH 1.40 02/28/2015   INR 1.02 11/17/2014   HGBA1C 5.4 04/07/2015       Assessment & Plan:   Problem List Items Addressed This Visit    Anemia - Primary    Check cbc and ferritin today.  On iron.        Relevant Orders   CBC with Differential/Platelet   Ferritin   B12 deficiency    Continue b12 injections.        Depression    Discussed with her today.  Overall feels she is handling things relatively well.  Follow.  Desires no further intervention.        Edema    Had noticed some increased lower extremity swelling.  No sob.  Swelling better now.  Follow.         Essential hypertension, benign    Blood pressure under good control.  Continue same medication regimen.  Follow pressures.  Follow metabolic panel.  Recent potassium check wnl.        GERD (gastroesophageal reflux disease)    No upper symptoms.  On omeprazole.        H/O gastric bypass    Check cbc and ferritin prior to surgery.        Hypercholesterolemia    Low cholesterol diet and exercise.  Follow lipid panel.        Hypothyroidism    On thyroid replacement.  Follow tsh.        Obesity (BMI 30-39.9)    Watches what she eats.  Unable to exercise.  Planing to ride bike.        Primary osteoarthritis of knee    Saw ortho last week.  S/p injection.  Continue f/u with ortho.        Rotator cuff syndrome    Has persistent problems with left rotator cuff.  Seeing Dr Roland Rack.  Planning for arthroscopy tomorrow.  No chest pain.  No sob.  Just had surgery a few months ago.  No problems with surgery.  Will need close intra op and post op monitoring of her heart rate and blood pressure to avoid extremes.  Recent potassium wnl.            Einar Pheasant, MD

## 2015-06-19 NOTE — Progress Notes (Signed)
Pre-visit discussion using our clinic review tool. No additional management support is needed unless otherwise documented below in the visit note.  

## 2015-06-20 ENCOUNTER — Encounter: Payer: Self-pay | Admitting: Internal Medicine

## 2015-06-20 ENCOUNTER — Ambulatory Visit
Admission: RE | Admit: 2015-06-20 | Discharge: 2015-06-20 | Disposition: A | Discharge status: Home / Self Care | Payer: 59 | Source: Ambulatory Visit | Attending: Surgery | Admitting: Surgery

## 2015-06-20 ENCOUNTER — Encounter: Payer: Self-pay | Admitting: *Deleted

## 2015-06-20 ENCOUNTER — Encounter
Admission: RE | Disposition: A | Discharge status: Home / Self Care | Payer: Self-pay | Source: Ambulatory Visit | Attending: Surgery

## 2015-06-20 ENCOUNTER — Ambulatory Visit: Payer: 59 | Admitting: Anesthesiology

## 2015-06-20 DIAGNOSIS — Z881 Allergy status to other antibiotic agents status: Secondary | ICD-10-CM | POA: Diagnosis not present

## 2015-06-20 DIAGNOSIS — Z8349 Family history of other endocrine, nutritional and metabolic diseases: Secondary | ICD-10-CM | POA: Diagnosis not present

## 2015-06-20 DIAGNOSIS — Z8249 Family history of ischemic heart disease and other diseases of the circulatory system: Secondary | ICD-10-CM | POA: Diagnosis not present

## 2015-06-20 DIAGNOSIS — Z887 Allergy status to serum and vaccine status: Secondary | ICD-10-CM | POA: Insufficient documentation

## 2015-06-20 DIAGNOSIS — Z9884 Bariatric surgery status: Secondary | ICD-10-CM | POA: Diagnosis not present

## 2015-06-20 DIAGNOSIS — L501 Idiopathic urticaria: Secondary | ICD-10-CM | POA: Insufficient documentation

## 2015-06-20 DIAGNOSIS — D649 Anemia, unspecified: Secondary | ICD-10-CM | POA: Insufficient documentation

## 2015-06-20 DIAGNOSIS — Z96652 Presence of left artificial knee joint: Secondary | ICD-10-CM | POA: Diagnosis not present

## 2015-06-20 DIAGNOSIS — M1990 Unspecified osteoarthritis, unspecified site: Secondary | ICD-10-CM | POA: Diagnosis not present

## 2015-06-20 DIAGNOSIS — E739 Lactose intolerance, unspecified: Secondary | ICD-10-CM | POA: Diagnosis not present

## 2015-06-20 DIAGNOSIS — Z833 Family history of diabetes mellitus: Secondary | ICD-10-CM | POA: Insufficient documentation

## 2015-06-20 DIAGNOSIS — I10 Essential (primary) hypertension: Secondary | ICD-10-CM | POA: Insufficient documentation

## 2015-06-20 DIAGNOSIS — M75102 Unspecified rotator cuff tear or rupture of left shoulder, not specified as traumatic: Secondary | ICD-10-CM | POA: Diagnosis present

## 2015-06-20 DIAGNOSIS — E282 Polycystic ovarian syndrome: Secondary | ICD-10-CM | POA: Insufficient documentation

## 2015-06-20 DIAGNOSIS — Z96642 Presence of left artificial hip joint: Secondary | ICD-10-CM | POA: Insufficient documentation

## 2015-06-20 DIAGNOSIS — I059 Rheumatic mitral valve disease, unspecified: Secondary | ICD-10-CM | POA: Insufficient documentation

## 2015-06-20 DIAGNOSIS — Z79899 Other long term (current) drug therapy: Secondary | ICD-10-CM | POA: Insufficient documentation

## 2015-06-20 DIAGNOSIS — Z91018 Allergy to other foods: Secondary | ICD-10-CM | POA: Insufficient documentation

## 2015-06-20 DIAGNOSIS — E039 Hypothyroidism, unspecified: Secondary | ICD-10-CM | POA: Insufficient documentation

## 2015-06-20 HISTORY — PX: SHOULDER ARTHROSCOPY: SHX128

## 2015-06-20 LAB — POCT PREGNANCY, URINE: Preg Test, Ur: NEGATIVE

## 2015-06-20 LAB — CBC WITH DIFFERENTIAL/PLATELET
BASOS PCT: 0.4 % (ref 0.0–3.0)
Basophils Absolute: 0 10*3/uL (ref 0.0–0.1)
Eosinophils Absolute: 0.2 10*3/uL (ref 0.0–0.7)
Eosinophils Relative: 2.1 % (ref 0.0–5.0)
HEMATOCRIT: 40.5 % (ref 36.0–46.0)
Hemoglobin: 13.6 g/dL (ref 12.0–15.0)
LYMPHS PCT: 31.7 % (ref 12.0–46.0)
Lymphs Abs: 3.1 10*3/uL (ref 0.7–4.0)
MCHC: 33.5 g/dL (ref 30.0–36.0)
MCV: 89.2 fl (ref 78.0–100.0)
MONOS PCT: 7.2 % (ref 3.0–12.0)
Monocytes Absolute: 0.7 10*3/uL (ref 0.1–1.0)
NEUTROS ABS: 5.6 10*3/uL (ref 1.4–7.7)
Neutrophils Relative %: 58.6 % (ref 43.0–77.0)
PLATELETS: 273 10*3/uL (ref 150.0–400.0)
RBC: 4.54 Mil/uL (ref 3.87–5.11)
RDW: 13.3 % (ref 11.5–15.5)
WBC: 9.6 10*3/uL (ref 4.0–10.5)

## 2015-06-20 LAB — FERRITIN: Ferritin: 26.4 ng/mL (ref 10.0–291.0)

## 2015-06-20 SURGERY — ARTHROSCOPY, SHOULDER
Anesthesia: General | Site: Shoulder | Laterality: Left | Wound class: Clean

## 2015-06-20 MED ORDER — MIDAZOLAM HCL 2 MG/2ML IJ SOLN
INTRAMUSCULAR | Status: DC | PRN
  Administered 2015-06-20: 2 mg via INTRAVENOUS

## 2015-06-20 MED ORDER — PHENYLEPHRINE HCL 10 MG/ML IJ SOLN
INTRAMUSCULAR | Status: DC | PRN
  Administered 2015-06-20 (×2): 100 ug via INTRAVENOUS

## 2015-06-20 MED ORDER — ONDANSETRON 4 MG PO TBDP
4.0000 mg | ORAL_TABLET | Freq: Three times a day (TID) | ORAL | Status: DC | PRN
Start: 2015-06-20 — End: 2015-11-28

## 2015-06-20 MED ORDER — DEXAMETHASONE SODIUM PHOSPHATE 10 MG/ML IJ SOLN
INTRAMUSCULAR | Status: DC | PRN
  Administered 2015-06-20: 4 mg via INTRAVENOUS

## 2015-06-20 MED ORDER — METOCLOPRAMIDE HCL 10 MG PO TABS: 5.0000 mg | ORAL_TABLET | Freq: Three times a day (TID) | ORAL | Status: DC | PRN

## 2015-06-20 MED ORDER — SCOPOLAMINE 1 MG/3DAYS TD PT72
MEDICATED_PATCH | TRANSDERMAL | Status: AC
  Administered 2015-06-20: 1.5 mg via TRANSDERMAL
  Filled 2015-06-20: qty 1

## 2015-06-20 MED ORDER — NEOSTIGMINE METHYLSULFATE 10 MG/10ML IV SOLN
INTRAVENOUS | Status: DC | PRN
  Administered 2015-06-20: 4 mg via INTRAVENOUS

## 2015-06-20 MED ORDER — ONDANSETRON HCL 4 MG/2ML IJ SOLN
INTRAMUSCULAR | Status: DC | PRN
  Administered 2015-06-20: 4 mg via INTRAVENOUS

## 2015-06-20 MED ORDER — LIDOCAINE HCL (CARDIAC) 20 MG/ML IV SOLN
INTRAVENOUS | Status: DC | PRN
  Administered 2015-06-20: 100 mg via INTRAVENOUS

## 2015-06-20 MED ORDER — FENTANYL CITRATE (PF) 100 MCG/2ML IJ SOLN
25.0000 ug | INTRAMUSCULAR | Status: DC | PRN
  Administered 2015-06-20 (×4): 25 ug via INTRAVENOUS

## 2015-06-20 MED ORDER — FENTANYL CITRATE (PF) 100 MCG/2ML IJ SOLN
INTRAMUSCULAR | Status: AC
  Administered 2015-06-20 (×2): 50 ug via INTRAVENOUS
  Filled 2015-06-20: qty 2

## 2015-06-20 MED ORDER — CEFAZOLIN SODIUM-DEXTROSE 2-3 GM-% IV SOLR
INTRAVENOUS | Status: AC
  Filled 2015-06-20: qty 50

## 2015-06-20 MED ORDER — MIDAZOLAM HCL 5 MG/5ML IJ SOLN
1.0000 mg | Freq: Once | INTRAMUSCULAR | Status: AC
  Administered 2015-06-20 (×2): 1 mg via INTRAVENOUS

## 2015-06-20 MED ORDER — POTASSIUM CHLORIDE IN NACL 20-0.9 MEQ/L-% IV SOLN
INTRAVENOUS | Status: DC
  Filled 2015-06-20: qty 1000

## 2015-06-20 MED ORDER — OXYCODONE HCL 5 MG PO TABS: 5.0000 mg | ORAL_TABLET | ORAL | Status: DC | PRN

## 2015-06-20 MED ORDER — EPINEPHRINE HCL 1 MG/ML IJ SOLN
INTRAMUSCULAR | Status: DC | PRN
  Administered 2015-06-20: 2 mg

## 2015-06-20 MED ORDER — OXYCODONE HCL 5 MG PO TABS
ORAL_TABLET | ORAL | Status: AC
  Filled 2015-06-20: qty 1

## 2015-06-20 MED ORDER — HYDROMORPHONE HCL 1 MG/ML IJ SOLN
INTRAMUSCULAR | Status: AC
  Administered 2015-06-20: 0.25 mg via INTRAVENOUS
  Filled 2015-06-20: qty 1

## 2015-06-20 MED ORDER — LACTATED RINGERS IV SOLN
INTRAVENOUS | Status: DC
  Administered 2015-06-20 (×2): via INTRAVENOUS

## 2015-06-20 MED ORDER — ROCURONIUM BROMIDE 100 MG/10ML IV SOLN
INTRAVENOUS | Status: DC | PRN
  Administered 2015-06-20: 5 mg via INTRAVENOUS
  Administered 2015-06-20: 30 mg via INTRAVENOUS

## 2015-06-20 MED ORDER — HYDROMORPHONE HCL 1 MG/ML IJ SOLN
0.2500 mg | INTRAMUSCULAR | Status: DC | PRN
  Administered 2015-06-20: 0.5 mg via INTRAVENOUS
  Administered 2015-06-20 (×2): 0.25 mg via INTRAVENOUS

## 2015-06-20 MED ORDER — ONDANSETRON HCL 4 MG/2ML IJ SOLN: 4.0000 mg | Freq: Four times a day (QID) | INTRAMUSCULAR | Status: DC | PRN

## 2015-06-20 MED ORDER — BUPIVACAINE-EPINEPHRINE (PF) 0.5% -1:200000 IJ SOLN
INTRAMUSCULAR | Status: AC
  Filled 2015-06-20: qty 30

## 2015-06-20 MED ORDER — SCOPOLAMINE 1 MG/3DAYS TD PT72
1.0000 | MEDICATED_PATCH | TRANSDERMAL | Status: DC
  Administered 2015-06-20: 1.5 mg via TRANSDERMAL

## 2015-06-20 MED ORDER — EPHEDRINE SULFATE 50 MG/ML IJ SOLN
INTRAMUSCULAR | Status: DC | PRN
  Administered 2015-06-20: 5 mg via INTRAVENOUS
  Administered 2015-06-20: 10 mg via INTRAVENOUS

## 2015-06-20 MED ORDER — FENTANYL CITRATE (PF) 100 MCG/2ML IJ SOLN
50.0000 ug | Freq: Once | INTRAMUSCULAR | Status: AC
  Administered 2015-06-20: 50 ug via INTRAVENOUS

## 2015-06-20 MED ORDER — ONDANSETRON HCL 4 MG/2ML IJ SOLN: 4.0000 mg | Freq: Once | INTRAMUSCULAR | Status: DC | PRN

## 2015-06-20 MED ORDER — GLYCOPYRROLATE 0.2 MG/ML IJ SOLN
INTRAMUSCULAR | Status: DC | PRN
  Administered 2015-06-20: 0.6 mg via INTRAVENOUS

## 2015-06-20 MED ORDER — ONDANSETRON HCL 4 MG PO TABS: 4.0000 mg | ORAL_TABLET | Freq: Four times a day (QID) | ORAL | Status: DC | PRN

## 2015-06-20 MED ORDER — OXYCODONE HCL 5 MG PO TABS
5.0000 mg | ORAL_TABLET | ORAL | Status: DC | PRN
  Administered 2015-06-20: 5 mg via ORAL

## 2015-06-20 MED ORDER — MIDAZOLAM HCL 5 MG/5ML IJ SOLN
INTRAMUSCULAR | Status: AC
  Administered 2015-06-20: 1 mg via INTRAVENOUS
  Filled 2015-06-20: qty 5

## 2015-06-20 MED ORDER — FENTANYL CITRATE (PF) 100 MCG/2ML IJ SOLN
INTRAMUSCULAR | Status: AC
  Administered 2015-06-20: 25 ug via INTRAVENOUS
  Filled 2015-06-20: qty 2

## 2015-06-20 MED ORDER — PROPOFOL 10 MG/ML IV BOLUS
INTRAVENOUS | Status: DC | PRN
  Administered 2015-06-20: 150 mg via INTRAVENOUS

## 2015-06-20 MED ORDER — BUPIVACAINE-EPINEPHRINE (PF) 0.5% -1:200000 IJ SOLN
INTRAMUSCULAR | Status: DC | PRN
  Administered 2015-06-20: 20 mL

## 2015-06-20 MED ORDER — SODIUM CHLORIDE 0.9 % IV SOLN
10000.0000 ug | INTRAVENOUS | Status: DC | PRN
  Administered 2015-06-20: 25 ug/min via INTRAVENOUS

## 2015-06-20 MED ORDER — METOCLOPRAMIDE HCL 5 MG/ML IJ SOLN: 5.0000 mg | Freq: Three times a day (TID) | INTRAMUSCULAR | Status: DC | PRN

## 2015-06-20 MED ORDER — MIDAZOLAM HCL 5 MG/5ML IJ SOLN: 1.0000 mg | Freq: Once | INTRAMUSCULAR | Status: DC

## 2015-06-20 MED ORDER — CEFAZOLIN SODIUM-DEXTROSE 2-3 GM-% IV SOLR
2.0000 g | Freq: Once | INTRAVENOUS | Status: AC
  Administered 2015-06-20: 2 g via INTRAVENOUS

## 2015-06-20 MED ORDER — EPINEPHRINE HCL 1 MG/ML IJ SOLN
INTRAMUSCULAR | Status: AC
  Filled 2015-06-20: qty 2

## 2015-06-20 SURGICAL SUPPLY — 61 items
ANCH SUT 2 2.9 2 LD TPR NDL (Anchor) ×5 IMPLANT
ANCH SUT KNTLS STRL SHLDR SYS (Anchor) ×2 IMPLANT
ANCHOR JUGGERKNOT WTAP NDL 2.9 (Anchor) ×14 IMPLANT
ANCHOR SUT QUATTRO KNTLS 4.5 (Anchor) ×4 IMPLANT
BIT DRILL JUGRKNT W/NDL BIT2.9 (DRILL) ×2 IMPLANT
BLADE FULL RADIUS 3.5 (BLADE) ×3 IMPLANT
BLADE SHAVER 4.5X7 STR FR (MISCELLANEOUS) ×3 IMPLANT
BUR ACROMIONIZER 4.0 (BURR) ×1 IMPLANT
BUR BR 5.5 WIDE MOUTH (BURR) ×1 IMPLANT
CANNULA 8.5X75 THRED (CANNULA) ×3 IMPLANT
CANNULA SHAVER 8MMX76MM (CANNULA) ×1 IMPLANT
CHLORAPREP W/TINT 26ML (MISCELLANEOUS) ×6 IMPLANT
CONEXA 3X3 (Orthopedic Implant) ×2 IMPLANT
DRAPE IMP U-DRAPE 54X76 (DRAPES) ×6 IMPLANT
DRAPE SURG 17X11 SM STRL (DRAPES) ×3 IMPLANT
DRILL JUGGERKNOT W/NDL BIT 2.9 (DRILL) ×3
DRSG OPSITE POSTOP 4X8 (GAUZE/BANDAGES/DRESSINGS) ×3 IMPLANT
ELECT REM PT RETURN 9FT ADLT (ELECTROSURGICAL) ×3
ELECTRODE REM PT RTRN 9FT ADLT (ELECTROSURGICAL) ×1 IMPLANT
GAUZE PETRO XEROFOAM 1X8 (MISCELLANEOUS) ×3 IMPLANT
GAUZE SPONGE 4X4 12PLY STRL (GAUZE/BANDAGES/DRESSINGS) ×3 IMPLANT
GLOVE BIO SURGEON STRL SZ7.5 (GLOVE) ×6 IMPLANT
GLOVE BIO SURGEON STRL SZ8 (GLOVE) ×6 IMPLANT
GLOVE BIOGEL PI IND STRL 8 (GLOVE) ×1 IMPLANT
GLOVE BIOGEL PI INDICATOR 8 (GLOVE) ×2
GLOVE INDICATOR 8.0 STRL GRN (GLOVE) ×3 IMPLANT
GOWN STRL REUS W/ TWL LRG LVL3 (GOWN DISPOSABLE) ×2 IMPLANT
GOWN STRL REUS W/ TWL XL LVL3 (GOWN DISPOSABLE) ×1 IMPLANT
GOWN STRL REUS W/TWL LRG LVL3 (GOWN DISPOSABLE) ×6
GOWN STRL REUS W/TWL XL LVL3 (GOWN DISPOSABLE) ×3
GRASPER SUT 15 45D LOW PRO (SUTURE) ×2 IMPLANT
IV LACTATED RINGER IRRG 3000ML (IV SOLUTION) ×6
IV LR IRRIG 3000ML ARTHROMATIC (IV SOLUTION) ×2 IMPLANT
MANIFOLD NEPTUNE II (INSTRUMENTS) ×3 IMPLANT
MASK FACE SPIDER DISP (MASK) ×3 IMPLANT
MAT BLUE FLOOR 46X72 FLO (MISCELLANEOUS) ×3 IMPLANT
NDL MAYO 6 CRC TAPER PT (NEEDLE) ×1 IMPLANT
NDL MAYO CATGUT SZ 2 (NEEDLE) ×1 IMPLANT
NDL MAYO CATGUT SZ4 (NEEDLE) IMPLANT
NDL MAYO CATGUT SZ4 TCR NDL (NEEDLE) ×1 IMPLANT
NDL REVERSE CUT 1/2 CRC (NEEDLE) ×1 IMPLANT
NEEDLE HYPO 22GX1.5 SAFETY (NEEDLE) ×3 IMPLANT
NEEDLE MAYO 6 CRC TAPER PT (NEEDLE) IMPLANT
NEEDLE MAYO CATGUT SZ 1.5 (NEEDLE)
NEEDLE MAYO CATGUT SZ 2 (NEEDLE) IMPLANT
NEEDLE REVERSE CUT 1/2 CRC (NEEDLE) IMPLANT
PACK ARTHROSCOPY SHOULDER (MISCELLANEOUS) ×3 IMPLANT
PASSER SUT CAPTURE FIRST (SUTURE) IMPLANT
SLING ARM LRG DEEP (SOFTGOODS) ×3 IMPLANT
SLING ULTRA II LG (MISCELLANEOUS) ×3 IMPLANT
STAPLER SKIN PROX 35W (STAPLE) ×3 IMPLANT
STRAP SAFETY BODY (MISCELLANEOUS) ×3 IMPLANT
SUT ETHIBOND 0 MO6 C/R (SUTURE) ×3 IMPLANT
SUT PROLENE 4 0 PS 2 18 (SUTURE) ×3 IMPLANT
SUT VIC AB 2-0 CT1 27 (SUTURE) ×6
SUT VIC AB 2-0 CT1 TAPERPNT 27 (SUTURE) ×2 IMPLANT
TAPE MICROFOAM 4IN (TAPE) ×3 IMPLANT
TUBING ARTHRO INFLOW-ONLY STRL (TUBING) ×3 IMPLANT
TUBING CONNECTING 10 (TUBING) ×2 IMPLANT
TUBING CONNECTING 10' (TUBING) ×1
WAND HAND CNTRL MULTIVAC 90 (MISCELLANEOUS) ×3 IMPLANT

## 2015-06-20 NOTE — Assessment & Plan Note (Signed)
Had noticed some increased lower extremity swelling.  No sob.  Swelling better now.  Follow.

## 2015-06-20 NOTE — Assessment & Plan Note (Signed)
Has persistent problems with left rotator cuff.  Seeing Dr Roland Rack.  Planning for arthroscopy tomorrow.  No chest pain.  No sob.  Just had surgery a few months ago.  No problems with surgery.  Will need close intra op and post op monitoring of her heart rate and blood pressure to avoid extremes.  Recent potassium wnl.

## 2015-06-20 NOTE — Assessment & Plan Note (Signed)
Watches what she eats.  Unable to exercise.  Planing to ride bike.

## 2015-06-20 NOTE — Assessment & Plan Note (Signed)
On thyroid replacement.  Follow tsh.  

## 2015-06-20 NOTE — Assessment & Plan Note (Signed)
No upper symptoms.  On omeprazole.  

## 2015-06-20 NOTE — Discharge Instructions (Addendum)
Keep dressing dry and intact.  May change dressing on post-op day #4 (Saturday) and sponge bathe, but do not remove the shoulder immobilizer.  Cover staples with Band-Aids after drying off. Apply ice frequently to shoulder. Keep shoulder immobilizer on at all times. Do not start physical therapy at least until after first postoperative appointment. Follow-up in 10-14 days or as scheduled.  AMBULATORY SURGERY  DISCHARGE INSTRUCTIONS   1) The drugs that you were given will stay in your system until tomorrow so for the next 24 hours you should not:  A) Drive an automobile B) Make any legal decisions C) Drink any alcoholic beverage   2) You may resume regular meals tomorrow.  Today it is better to start with liquids and gradually work up to solid foods.  You may eat anything you prefer, but it is better to start with liquids, then soup and crackers, and gradually work up to solid foods.   3) Please notify your doctor immediately if you have any unusual bleeding, trouble breathing, redness and pain at the surgery site, drainage, fever, or pain not relieved by medication.    4) Additional Instructions:  Please contact your physician with any problems or Same Day Surgery at [phone], Monday through Friday 6 am to 4 pm, or [place] at [facility] number at [phone].

## 2015-06-20 NOTE — Assessment & Plan Note (Signed)
Saw ortho last week.  S/p injection.  Continue f/u with ortho.

## 2015-06-20 NOTE — Assessment & Plan Note (Signed)
Blood pressure under good control.  Continue same medication regimen.  Follow pressures.  Follow metabolic panel.  Recent potassium check wnl.

## 2015-06-20 NOTE — Assessment & Plan Note (Signed)
Check cbc and ferritin today.  On iron.

## 2015-06-20 NOTE — H&P (Signed)
Paper H&P to be scanned into permanent record. H&P reviewed. No changes. 

## 2015-06-20 NOTE — Assessment & Plan Note (Signed)
Discussed with her today.  Overall feels she is handling things relatively well.  Follow.  Desires no further intervention.

## 2015-06-20 NOTE — Assessment & Plan Note (Signed)
Continue b12 injections.  

## 2015-06-20 NOTE — Anesthesia Preprocedure Evaluation (Addendum)
Anesthesia Evaluation  Patient identified by MRN, date of birth, ID band Patient awake    Reviewed: Allergy & Precautions, NPO status , Patient's Chart, lab work & pertinent test results  History of Anesthesia Complications (+) PONV and history of anesthetic complications  Airway Mallampati: II  TM Distance: >3 FB Neck ROM: Full    Dental no notable dental hx.    Pulmonary neg pulmonary ROS,    Pulmonary exam normal        Cardiovascular hypertension, Pt. on medications Normal cardiovascular exam     Neuro/Psych  Headaches, Depression    GI/Hepatic GERD  Medicated and Controlled,  Endo/Other  Hypothyroidism   Renal/GU    Polycystic ovaries    Musculoskeletal  (+) Arthritis , Osteoarthritis,    Abdominal Normal abdominal exam  (+)   Peds negative pediatric ROS (+)  Hematology  (+) anemia ,   Anesthesia Other Findings   Reproductive/Obstetrics                            Anesthesia Physical Anesthesia Plan  ASA: II  Anesthesia Plan: General   Post-op Pain Management:  Regional for Post-op pain   Induction: Intravenous  Airway Management Planned: Oral ETT  Additional Equipment:   Intra-op Plan:   Post-operative Plan: Extubation in OR  Informed Consent: I have reviewed the patients History and Physical, chart, labs and discussed the procedure including the risks, benefits and alternatives for the proposed anesthesia with the patient or authorized representative who has indicated his/her understanding and acceptance.   Dental advisory given  Plan Discussed with: CRNA  Anesthesia Plan Comments: (Talked about risks of interscalene block for post op pain relief.   Patient understands risks and wishes to proceed with the block as requested by Dr. Roland Rack)       Anesthesia Quick Evaluation

## 2015-06-20 NOTE — Op Note (Signed)
06/20/2015  12:50 PM  Patient:   Lori Le  Pre-Op Diagnosis:   Massive rotator cuff tear, left shoulder.  Postoperative diagnosis: Massive rotator cuff tear with labral fraying, left shoulder.  Procedure: Limited arthroscopic debridement, arthroscopic subacromial decompression, mini-open repair of massive rotator cuff tear with allograft, and mini-open biceps tenodesis, left shoulder.  Anesthesia: General endotracheal with interscalene block placed preoperatively by the anesthesiologist.  Surgeon:   Pascal Lux, MD  Assistant:   None  Findings: As above. There was a large full-thickness tear of the rotator cuff involving both the supraspinatus and infraspinatus tendons with retraction. The subscapularis and teres minor tendons both appeared to be in excellent condition. There was some fraying of the labrum without detachment from the glenoid. The biceps tendon was in satisfactory condition, as were the articular surfaces of both the glenoid and humerus.  Complications: None  Fluids:   1000 cc  Estimated blood loss: 20 cc  Tourniquet time: None  Drains: None  Closure: Staples   Brief clinical note: The patient is a 50 year old female with a several year history of left shoulder pain. The patient's symptoms have progressed despite medications, activity modification, etc. The patient's history and examination are consistent with impingement/tendinopathy with a large rotator cuff tear. These findings were confirmed by MRI scan. The patient presents at this time for definitive management of these shoulder symptoms.  Procedure: The patient underwent placement of an interscalene block by the anesthesiologist in the preoperative holding area before she was brought into the operating room and lain in the supine position. The patient then underwent general endotracheal intubation and anesthesia before being repositioned in the beach chair position using the  beach chair positioner. The left shoulder and upper extremity were prepped with ChloraPrep solution before being draped sterilely. Preoperative antibiotics were administered. A timeout was performed to confirm the proper surgical site before the expected portal sites and incision site were injected with 0.5% Sensorcaine with epinephrine. A posterior portal was created and the glenohumeral joint thoroughly inspected with the findings as described above. An anterior portal was created using an outside-in technique. The labrum and rotator cuff were further probed, again confirming the above-noted findings. The areas of labral fraying, margins of the rotator cuff tear, and areas of synovitis anteriorly were debrided back to stable margins using the full-radius resector. The ArthroCare wand was inserted and used to obtain hemostasis as well as to "anneal" the labrum superiorly and anteriorly. The instruments were removed from the joint after suctioning the excess fluid.  The camera was repositioned through the posterior portal into the subacromial space. A separate lateral portal was created using an outside-in technique. The 3.5 mm full-radius resector was introduced and used to perform a subtotal bursectomy. The ArthroCare wand was then inserted and used to remove the periosteal tissue off the undersurface of the anterior third of the acromion as well as to recess the coracoacromial ligament from its attachment along the anterior and lateral margins of the acromion. The 4.0 mm acromionizing bur was introduced and used to complete the decompression by removing the undersurface of the anterior third of the acromion. The full radius resector was reintroduced to remove any residual bony debris before the ArthroCare wand was reintroduced to obtain hemostasis. The instruments were then removed from the subacromial space after suctioning the excess fluid.  An approximately 5-6 cm incision was made over the anterolateral  aspect of the shoulder beginning at the anterolateral corner of the acromion and extending distally  in line with the bicipital groove. This incision was carried down through the subcutaneous tissues to expose the deltoid fascia. The raphae between the anterior and middle thirds was identified and this plane developed to provide access into the subacromial space. Additional bursal tissues were debrided sharply using Metzenbaum scissors. The rotator cuff tear was readily identified. The margins were debrided sharply with a #15 blade and the exposed greater tuberosity roughened with a rongeur. The tear was repaired using three Biomet 2.9 mm JuggerKnot anchors. Despite extensive mobilization, there was still a small defect laterally near the supraspinatus/supraspinatus junction. Therefore, it was elected to proceed with placing a Conexa allograft patch to cover this area. The graft was shaped and numerous sutures placed through the rotator cuff and passed through the graft before the graft was tied securely to the repaired margin of the rotator cuff. A fourth Biomet 2.9 JuggerKnot anchor was placed at the midportion the graft, approximately 1 cm lateral to the articular margin. Both sets of sutures were passed through this graft and brought back laterally through two [facility] anchors to reinforce this repair laterally. An apparent watertight closure was obtained which did not appear to be too tight as the arm was able to rest at the patient's side.  The bicipital groove was identified by palpation and opened for 1-1.5 cm. The biceps tendon stump was retrieved through this defect. The floor of the bicipital groove was roughened with a curet before another Biomet 2.9 mm JuggerKnot anchor was inserted. Both sets of sutures were passed through the biceps tendon and tied securely to effect the tenodesis. The bicipital sheath was reapproximated using two #0 Ethibond interrupted sutures, incorporating the biceps  tendon to further reinforce the tenodesis.  The wound was copiously irrigated with sterile saline solution before the deltoid raphae was reapproximated using 2-0 Vicryl interrupted sutures. The subcutaneous tissues were closed in two layers using 2-0 Vicryl interrupted sutures before the skin was closed using staples. The portal sites also were closed using staples. A sterile bulky dressing was applied to the shoulder before the arm was placed into a shoulder immobilizer. The patient was then awakened, extubated, and returned to the recovery room in satisfactory condition after tolerating the procedure well.

## 2015-06-20 NOTE — Anesthesia Procedure Notes (Addendum)
Anesthesia Regional Block:  Interscalene brachial plexus block  Pre-Anesthetic Checklist: ,, timeout performed, Correct Patient, Correct Site, Correct Laterality, Correct Procedure, Correct Position, site marked, Risks and benefits discussed,  Surgical consent,  Pre-op evaluation,  At surgeon's request and post-op pain management  Laterality: Left  Prep: Maximum Sterile Barrier Precautions used and chloraprep       Needles:   Needle Type: Stimiplex      Needle Gauge: 22 and 22 G    Additional Needles:  Procedures: ultrasound guided (picture in chart) and nerve stimulator Interscalene brachial plexus block  Nerve Stimulator or Paresthesia:  Response: 0.4 mA,   Additional Responses:   Narrative:  Start time: 06/20/2015 9:35 AM End time: 06/20/2015 9:50 AM Injection made incrementally with aspirations every 5 mL. Anesthesiologist: Alvin Critchley  Additional Notes: Time out called.  A roll was placed under the left back to optimize the position.  The left neck was prepped and draped in sterile fashion.  A sterile prepped probe was used to identify the brachial plexus at the C6 level.  A skin wheal was made lateral to this position with 1% lidocaine plain.  The 22 G stimuplex needle was advanced to the C5-C6 nerve roots with a stimulus obtained down to 0.4 mAmps before fade.  Incremental injection of 30 cc of a mix of 0.2% Ropivacaine and 0.5% Ropivacaine plain was given with no pain on injection and easy injectability.  Patient only felt pressure....good spread around the nerves.  A picture was not obtained.  Patient tolerated the procedure well   Procedure Name: Intubation Performed by: Lance Muss Pre-anesthesia Checklist: Emergency Drugs available, Patient identified, Suction available, Patient being monitored and Timeout performed Patient Re-evaluated:Patient Re-evaluated prior to inductionOxygen Delivery Method: Circle system utilized Preoxygenation: Pre-oxygenation with 100%  oxygen Intubation Type: IV induction Ventilation: Mask ventilation without difficulty Laryngoscope Size: Mac and 3 Grade View: Grade I Tube type: Oral Tube size: 7.5 mm Number of attempts: 1 Placement Confirmation: ETT inserted through vocal cords under direct vision,  positive ETCO2 and breath sounds checked- equal and bilateral Secured at: 22 cm Tube secured with: Tape Dental Injury: Teeth and Oropharynx as per pre-operative assessment

## 2015-06-20 NOTE — Anesthesia Postprocedure Evaluation (Signed)
Anesthesia Post Note  Patient: Lori Le  Procedure(s) Performed: Procedure(s) (LRB): ARTHROSCOPY SHOULDER, REPAIR OF MASSIVE ROTATOR CUFF TEAR, TENODESIS, DECOMPRESSION DEBRIDEMENT (Left)  Patient location during evaluation: PACU Anesthesia Type: General Level of consciousness: awake and alert and oriented Pain management: pain level controlled Vital Signs Assessment: post-procedure vital signs reviewed and stable Respiratory status: spontaneous breathing and respiratory function stable Cardiovascular status: blood pressure returned to baseline Anesthetic complications: no Comments: Patient had interscalene block with good spread and good stim by stimuplex needle , but had some discomfort in RR that decreased with pain meds.  Patient had extensive surgery and retained sensory/motor to operative extremity in PACU    Last Vitals:  Filed Vitals:   06/20/15 1412 06/20/15 1520  BP: 125/59 120/60  Pulse: 72 70  Temp: 36.7 C   Resp: 16 16    Last Pain:  Filed Vitals:   06/20/15 1521  PainSc: 3                  Khyrin Trevathan

## 2015-06-20 NOTE — Assessment & Plan Note (Signed)
Low cholesterol diet and exercise.  Follow lipid panel.   

## 2015-06-20 NOTE — Transfer of Care (Signed)
Immediate Anesthesia Transfer of Care Note  Patient: Lori Le  Procedure(s) Performed: Procedure(s): ARTHROSCOPY SHOULDER, REPAIR OF MASSIVE ROTATOR CUFF TEAR, TENODESIS, DECOMPRESSION DEBRIDEMENT (Left)  Patient Location: PACU  Anesthesia Type:General  Level of Consciousness: awake, alert  and oriented  Airway & Oxygen Therapy: Patient Spontanous Breathing and Patient connected to face mask oxygen  Post-op Assessment: Report given to RN and Post -op Vital signs reviewed and stable  Post vital signs: Reviewed and stable  Last Vitals:  Filed Vitals:   06/20/15 0831 06/20/15 1300  BP: 132/62 142/69  Pulse: 64 80  Temp: 36.8 C   Resp: 18 13    Complications: No apparent anesthesia complications

## 2015-06-20 NOTE — Assessment & Plan Note (Signed)
Check cbc and ferritin prior to surgery.

## 2015-06-21 ENCOUNTER — Encounter: Payer: Self-pay | Admitting: Surgery

## 2015-06-28 ENCOUNTER — Other Ambulatory Visit: Payer: Self-pay | Admitting: Internal Medicine

## 2015-07-17 ENCOUNTER — Encounter: Payer: Self-pay | Admitting: Family Medicine

## 2015-07-17 ENCOUNTER — Ambulatory Visit (INDEPENDENT_AMBULATORY_CARE_PROVIDER_SITE_OTHER): Payer: 59 | Admitting: Family Medicine

## 2015-07-17 VITALS — BP 114/80 | HR 83 | Temp 98.1°F | Ht 67.0 in | Wt 208.0 lb

## 2015-07-17 DIAGNOSIS — H6980 Other specified disorders of Eustachian tube, unspecified ear: Secondary | ICD-10-CM | POA: Diagnosis not present

## 2015-07-17 DIAGNOSIS — H6990 Unspecified Eustachian tube disorder, unspecified ear: Secondary | ICD-10-CM | POA: Insufficient documentation

## 2015-07-17 NOTE — Assessment & Plan Note (Signed)
New problem. Likely exacerbated by allergic rhinitis. Advised OTC Sudafed, Flonase, Zyrtec.

## 2015-07-17 NOTE — Patient Instructions (Signed)
Use flonase and zyrtec daily.  You can use sudafed as well.  Call if you worsen or fail to improve.  Take care  Dr. Lacinda Axon

## 2015-07-17 NOTE — Progress Notes (Signed)
   Subjective:  Patient ID: Lori Le, female    DOB: [birth year]  Age: 50 y.o. MRN: [id number]  CC: Ear pain  HPI:  50 year old female presents with the above complaint.  Ear pain  Patient reports bilateral ear discomfort, right greater left.  She states that her ears are "stopped up".  He states that she has been experiencing a lot of popping.  No associated fevers or chills.  She does note some runny nose and sneezing.  She's been using Zyrtec and Afrin nasal spray without resolution.  Possible allergy component exacerbating symptoms.  Social Hx   Social History   Social History  . Marital Status: Married    Spouse Name: N/A  . Number of Children: N/A  . Years of Education: N/A   Social History Main Topics  . Smoking status: Never Smoker   . Smokeless tobacco: Never Used  . Alcohol Use: 0.0 - 0.6 oz/week    0-1 Glasses of wine, 0 Standard drinks or equivalent per week     Comment: occ.  . Drug Use: No  . Sexual Activity: No     Comment: Paragard   Other Topics Concern  . None   Social History Narrative   Review of Systems  Constitutional: Negative.   HENT: Positive for ear pain, rhinorrhea and sneezing.    Objective:  BP 114/80 mmHg  Pulse 83  Temp(Src) 98.1 F (36.7 C) (Oral)  Ht 5\' 7"  (1.702 m)  Wt 208 lb (94.348 kg)  BMI 32.57 kg/m2  SpO2 96%  BP/Weight 07/17/2015 06/19/2015 0000000  Systolic BP 99991111 [id number]  Diastolic BP 80 70 43  Wt. (Lbs) 208 214.13 215  BMI 32.57 33.53 33.16   Physical Exam  Constitutional: She is oriented to person, place, and time. She appears well-developed. No distress.  HENT:  Head: Normocephalic and atraumatic.  Mouth/Throat: Oropharynx is clear and moist.  Normal TM's bilaterally.   Neck: Neck supple.  Cardiovascular: Normal rate and regular rhythm.   Pulmonary/Chest: Effort normal and breath sounds normal.  Lymphadenopathy:    She has no cervical adenopathy.  Neurological: She is alert and oriented  to person, place, and time.  Vitals reviewed.  Lab Results  Component Value Date   WBC 9.6 06/19/2015   HGB 13.6 06/19/2015   HCT 40.5 06/19/2015   PLT 273.0 06/19/2015   GLUCOSE 91 04/07/2015   CHOL 214* 04/07/2015   TRIG 186* 04/07/2015   HDL 52 04/07/2015   LDLDIRECT 121.0 11/16/2014   LDLCALC 125* 04/07/2015   ALT 11 04/07/2015   AST 9 04/07/2015   NA 140 04/07/2015   K 3.6 06/12/2015   CL 95* 04/07/2015   CREATININE 0.63 04/07/2015   BUN 20 04/07/2015   CO2 27 04/07/2015   TSH 1.40 02/28/2015   INR 1.02 11/17/2014   HGBA1C 5.4 04/07/2015    Assessment & Plan:   Problem List Items Addressed This Visit    Eustachian tube dysfunction - Primary    New problem. Likely exacerbated by allergic rhinitis. Advised OTC Sudafed, Flonase, Zyrtec.        Follow-up: PRN  Craig

## 2015-08-11 ENCOUNTER — Other Ambulatory Visit: Payer: Self-pay | Admitting: Internal Medicine

## 2015-08-11 DIAGNOSIS — Z760 Encounter for issue of repeat prescription: Secondary | ICD-10-CM

## 2015-08-15 ENCOUNTER — Other Ambulatory Visit: Payer: Self-pay

## 2015-08-15 MED ORDER — INTEGRA 62.5-62.5-40-3 MG PO CAPS: 1.0000 | ORAL_CAPSULE | Freq: Every day | ORAL | Status: DC

## 2015-08-16 MED ORDER — INTEGRA 62.5-62.5-40-3 MG PO CAPS: 1.0000 | ORAL_CAPSULE | Freq: Every day | ORAL | Status: DC

## 2015-08-16 NOTE — Addendum Note (Signed)
Addended by: Bevelyn Ngo on: 08/16/2015 11:06 AM   Modules accepted: Orders

## 2015-09-11 ENCOUNTER — Other Ambulatory Visit: Payer: Self-pay | Admitting: *Deleted

## 2015-09-11 MED ORDER — LEVOTHYROXINE SODIUM 137 MCG PO TABS: ORAL_TABLET | ORAL | Status: DC

## 2015-09-25 ENCOUNTER — Ambulatory Visit (INDEPENDENT_AMBULATORY_CARE_PROVIDER_SITE_OTHER): Payer: 59 | Admitting: Internal Medicine

## 2015-09-25 ENCOUNTER — Encounter: Payer: Self-pay | Admitting: Internal Medicine

## 2015-09-25 VITALS — BP 130/70 | HR 103 | Temp 98.5°F | Resp 18 | Ht 67.0 in | Wt 208.4 lb

## 2015-09-25 DIAGNOSIS — F329 Major depressive disorder, single episode, unspecified: Secondary | ICD-10-CM | POA: Diagnosis not present

## 2015-09-25 DIAGNOSIS — E7800 Pure hypercholesterolemia, unspecified: Secondary | ICD-10-CM

## 2015-09-25 DIAGNOSIS — E538 Deficiency of other specified B group vitamins: Secondary | ICD-10-CM

## 2015-09-25 DIAGNOSIS — Z9884 Bariatric surgery status: Secondary | ICD-10-CM

## 2015-09-25 DIAGNOSIS — E039 Hypothyroidism, unspecified: Secondary | ICD-10-CM

## 2015-09-25 DIAGNOSIS — D039 Melanoma in situ, unspecified: Secondary | ICD-10-CM | POA: Diagnosis not present

## 2015-09-25 DIAGNOSIS — I10 Essential (primary) hypertension: Secondary | ICD-10-CM

## 2015-09-25 DIAGNOSIS — R609 Edema, unspecified: Secondary | ICD-10-CM

## 2015-09-25 DIAGNOSIS — Z9889 Other specified postprocedural states: Secondary | ICD-10-CM

## 2015-09-25 DIAGNOSIS — E669 Obesity, unspecified: Secondary | ICD-10-CM

## 2015-09-25 DIAGNOSIS — F32A Depression, unspecified: Secondary | ICD-10-CM

## 2015-09-25 DIAGNOSIS — K219 Gastro-esophageal reflux disease without esophagitis: Secondary | ICD-10-CM

## 2015-09-25 MED ORDER — CYANOCOBALAMIN 1000 MCG/ML IJ SOLN
1000.0000 ug | Freq: Once | INTRAMUSCULAR | Status: AC
  Administered 2015-09-25: 1000 ug via INTRAMUSCULAR

## 2015-09-25 NOTE — Progress Notes (Signed)
Pre-visit discussion using our clinic review tool. No additional management support is needed unless otherwise documented below in the visit note.  

## 2015-09-25 NOTE — Progress Notes (Signed)
Patient ID: Lori Le, female   DOB: 1965/04/24, 50 y.o.   MRN: [id number]   Subjective:    Patient ID: Lori Le, female    DOB: [birth year], 50 y.o.   MRN: [id number]  HPI  Patient here for a scheduled follow up.  She is s/p left shoulder surgery.  Limited activity.  Followed by ortho.  She is still under increased stress.  She is seeing a Social worker.  Overall she feels she is coping with things relatively well.  No chest pain.  No sob.  No acid reflux.  No abdominal pain or cramping.  Bowels stable.  Still with some knee issues and swelling.     Past Medical History  Diagnosis Date  . Hyperlipidemia   . Allergy   . GERD (gastroesophageal reflux disease)   . Arthritis   . 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 History of eating disorder   . Migraine   . Hypothyroidism   . PCOS (polycystic ovarian syndrome)   . Gout   . Lower extremity edema   . Complication of anesthesia     nausea, slow to wake up  . PONV (postoperative nausea and vomiting)    Past Surgical History  Procedure Laterality Date  . Cholecystectomy  2003  . Foot surgery  1994  . Gastric bypass  2011  . Laparoscopic gastric banding  2010  . Knee arthroscopy Right   . 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 Total knee arthroplasty Left 12/01/2014    Procedure: TOTAL KNEE ARTHROPLASTY;  Surgeon: Hessie Knows, MD;  Location: [facility] ORS;  Service: Orthopedics;  Laterality: Left;  . Knee arthroscopy Left 03/23/2015    Procedure: ARTHROSCOPY KNEE, partial synovectomy;  Surgeon: Hessie Knows, MD;  Location: [facility] ORS;  Service: Orthopedics;  Laterality: Left;  . Shoulder arthroscopy Left 06/20/2015    Procedure: ARTHROSCOPY SHOULDER, REPAIR OF MASSIVE ROTATOR CUFF TEAR, TENODESIS, DECOMPRESSION DEBRIDEMENT;  Surgeon: Corky Mull, MD;  Location: [facility] ORS;  Service:  Orthopedics;  Laterality: Left;   Family History  Problem Relation Age of Onset  . Arthritis Mother   . Hyperlipidemia Mother   . Hypertension Mother   . Diabetes Mother   . Arthritis Father   . Hyperlipidemia Father   . Hypertension Father   . Mental illness Father   . Diabetes Father   . Arthritis Maternal Grandmother   . Cancer Maternal Grandmother     breast cancer  . Hyperlipidemia Maternal Grandmother   . Hypertension Maternal Grandmother   . Arthritis Maternal Grandfather   . Hyperlipidemia Maternal Grandfather   . Hypertension Maternal Grandfather   . Heart disease Maternal Grandfather     heart attack  . Breast cancer      maternal and paternal grandmother   Social History   Social History  . Marital Status: Married    Spouse Name: N/A  . Number of Children: N/A  . Years of Education: N/A   Social History Main Topics  . Smoking status: Never Smoker   . Smokeless tobacco: Never Used  . Alcohol Use: 0.0 - 0.6 oz/week    0-1 Glasses of wine, 0 Standard drinks or equivalent per week     Comment: occ.  . Drug Use: No  . Sexual Activity: No     Comment: Paragard  Other Topics Concern  . None   Social History Narrative    Outpatient Encounter Prescriptions as of 09/25/2015  Medication Sig  . Dermatological Products, Misc. (NUVAIL) SOLN Reported on 06/12/2015  . Emollient (CERAVE) CREA   . Fe Fum-FePoly-Vit C-Vit B3 (INTEGRA) 62.5-62.5-40-3 MG CAPS Take 1 capsule by mouth daily.  . furosemide (LASIX) 40 MG tablet TAKE 1 TABLET BY MOUTH EVERY DAY  . KLOR-CON 10 10 MEQ tablet TAKE ONE TABLET TWICE A DAY FOR TWO DAYS AND THEN ONE TAB EVERY DAY.  [name] levothyroxine (SYNTHROID, LEVOTHROID) 137 MCG tablet TAKE ONE TABLET BY MOUTH ONCE DAILY BEFORE BREAKFAST  . nabumetone (RELAFEN) 750 MG tablet Take by mouth. Reported on 06/12/2015  . omeprazole (PRILOSEC) 20 MG capsule Take 1 capsule (20 mg total) by mouth daily. (Patient taking differently: Take 20 mg by mouth  daily. This is OTC medication.)  . ondansetron (ZOFRAN ODT) 4 MG disintegrating tablet Take 1-2 tablets (4-8 mg total) by mouth every 8 (eight) hours as needed for nausea or vomiting.  [name] oxyCODONE (ROXICODONE) 5 MG immediate release tablet Take 1-2 tablets (5-10 mg total) by mouth every 4 (four) hours as needed for severe pain.  [name] triamcinolone (KENALOG) 0.025 % cream Reported on 06/12/2015  . triamterene-hydrochlorothiazide (MAXZIDE-25) 37.5-25 MG tablet TAKE 1 TABLET BY MOUTH EVERY DAY  . [EXPIRED] cyanocobalamin ((VITAMIN B-12)) injection 1,000 mcg    No facility-administered encounter medications on file as of 09/25/2015.    Review of Systems  Constitutional: Negative for appetite change and unexpected weight change.  HENT: Negative for congestion and sinus pressure.   Respiratory: Negative for cough, chest tightness and shortness of breath.   Cardiovascular: Negative for chest pain, palpitations and leg swelling.  Gastrointestinal: Negative for nausea, vomiting, abdominal pain and diarrhea.  Genitourinary: Negative for dysuria and difficulty urinating.  Musculoskeletal: Negative for back pain.       Left shoulder pain.  S/p surgery.    Skin: Negative for color change and rash.  Neurological: Negative for dizziness, light-headedness and headaches.  Psychiatric/Behavioral: Negative for dysphoric mood and agitation.       Objective:     Blood pressure rechecked by me:  112/68  Physical Exam  Constitutional: She appears well-developed and well-nourished. No distress.  HENT:  Nose: Nose normal.  Mouth/Throat: Oropharynx is clear and moist.  Neck: Neck supple. No thyromegaly present.  Cardiovascular: Normal rate and regular rhythm.   Pulmonary/Chest: Breath sounds normal. No respiratory distress. She has no wheezes.  Abdominal: Soft. Bowel sounds are normal. There is no tenderness.  Musculoskeletal: She exhibits no tenderness.  No increased edema.  Improved.   Lymphadenopathy:     She has no cervical adenopathy.  Skin: No rash noted. No erythema.  Psychiatric: She has a normal mood and affect. Her behavior is normal.    BP 130/70 mmHg  Pulse 103  Temp(Src) 98.5 F (36.9 C) (Oral)  Resp 18  Ht 5\' 7"  (1.702 m)  Wt 208 lb 6 oz (94.518 kg)  BMI 32.63 kg/m2  SpO2 97% Wt Readings from Last 3 Encounters:  09/25/15 208 lb 6 oz (94.518 kg)  07/17/15 208 lb (94.348 kg)  06/20/15 214 lb (97.07 kg)     Lab Results  Component Value Date   WBC 9.6 06/19/2015   HGB 13.6 06/19/2015   HCT 40.5 06/19/2015   PLT 273.0 06/19/2015   GLUCOSE 91 04/07/2015   CHOL 214* 04/07/2015   TRIG 186* 04/07/2015   HDL 52 04/07/2015  LDLDIRECT 121.0 11/16/2014   LDLCALC 125* 04/07/2015   ALT 11 04/07/2015   AST 9 04/07/2015   NA 140 04/07/2015   K 3.6 06/12/2015   CL 95* 04/07/2015   CREATININE 0.63 04/07/2015   BUN 20 04/07/2015   CO2 27 04/07/2015   TSH 1.40 02/28/2015   INR 1.02 11/17/2014   HGBA1C 5.4 04/07/2015       Assessment & Plan:   Problem List Items Addressed This Visit    B12 deficiency    Continue B12 injections.        Relevant Medications   cyanocobalamin ((VITAMIN B-12)) injection 1,000 mcg (Completed)   Depression    She feels she is handling things relatively well.  Seeing a Social worker.  Stable.  Follow.       Edema    Better.  Follow.       Essential hypertension, benign - Primary    Blood pressure under good control.  Continue same medication regimen.  Follow pressures.  Follow metabolic panel.        Relevant Orders   Basic metabolic panel   GERD (gastroesophageal reflux disease)    Controlled on prilosec.       H/O gastric bypass    Follow cbc.  Continue diet adjustment and exercise as tolerated.        Relevant Orders   CBC with Differential/Platelet   Ferritin   Hypercholesterolemia    Low cholesterol diet and exercise.  Follow lipid panel.        Relevant Orders   Lipid panel   Hepatic function panel    Hypothyroidism    On thyroid replacement.  Follow tsh.       Melanoma in situ [facility])    Followed by dermatology.       Obesity (BMI 30-39.9)    Diet and exercise.  Follow.           Einar Pheasant, MD

## 2015-10-01 ENCOUNTER — Encounter: Payer: Self-pay | Admitting: Internal Medicine

## 2015-10-01 NOTE — Assessment & Plan Note (Signed)
Continue B12 injections.   

## 2015-10-01 NOTE — Assessment & Plan Note (Signed)
Diet and exercise.  Follow.  

## 2015-10-01 NOTE — Assessment & Plan Note (Signed)
Blood pressure under good control.  Continue same medication regimen.  Follow pressures.  Follow metabolic panel.   

## 2015-10-01 NOTE — Assessment & Plan Note (Signed)
Low cholesterol diet and exercise.  Follow lipid panel.   

## 2015-10-01 NOTE — Assessment & Plan Note (Signed)
She feels she is handling things relatively well.  Seeing a Social worker.  Stable.  Follow.

## 2015-10-01 NOTE — Assessment & Plan Note (Signed)
Followed by dermatology

## 2015-10-01 NOTE — Assessment & Plan Note (Signed)
Controlled on prilosec.   

## 2015-10-01 NOTE — Assessment & Plan Note (Signed)
Follow cbc.  Continue diet adjustment and exercise as tolerated.

## 2015-10-01 NOTE — Assessment & Plan Note (Signed)
Better.  Follow.  

## 2015-10-01 NOTE — Assessment & Plan Note (Signed)
On thyroid replacement.  Follow tsh.  

## 2015-10-02 ENCOUNTER — Encounter: Payer: Self-pay | Admitting: Internal Medicine

## 2015-10-02 MED ORDER — BUPROPION HCL ER (XL) 150 MG PO TB24: 150.0000 mg | ORAL_TABLET | Freq: Every day | ORAL | Status: DC

## 2015-10-02 NOTE — Telephone Encounter (Signed)
Spoke to pt.  She is doing ok, just feels she needs a little something to level things out.  wellbutrin XL 150mg  #30 with 2 refills sent in to pharmacy.  Will schedule f/u appt in 8 weeks.  She will call if problems.

## 2015-11-01 ENCOUNTER — Encounter: Payer: Self-pay | Admitting: Internal Medicine

## 2015-11-28 ENCOUNTER — Encounter: Payer: Self-pay | Admitting: Internal Medicine

## 2015-11-28 ENCOUNTER — Ambulatory Visit (INDEPENDENT_AMBULATORY_CARE_PROVIDER_SITE_OTHER): Payer: 59 | Admitting: Internal Medicine

## 2015-11-28 ENCOUNTER — Telehealth: Payer: Self-pay

## 2015-11-28 VITALS — BP 110/74 | HR 72 | Temp 98.2°F | Resp 16 | Wt 208.0 lb

## 2015-11-28 DIAGNOSIS — E7800 Pure hypercholesterolemia, unspecified: Secondary | ICD-10-CM | POA: Diagnosis not present

## 2015-11-28 DIAGNOSIS — F32A Depression, unspecified: Secondary | ICD-10-CM

## 2015-11-28 DIAGNOSIS — K920 Hematemesis: Secondary | ICD-10-CM | POA: Diagnosis not present

## 2015-11-28 DIAGNOSIS — R609 Edema, unspecified: Secondary | ICD-10-CM

## 2015-11-28 DIAGNOSIS — Z9884 Bariatric surgery status: Secondary | ICD-10-CM

## 2015-11-28 DIAGNOSIS — G43809 Other migraine, not intractable, without status migrainosus: Secondary | ICD-10-CM

## 2015-11-28 DIAGNOSIS — Z9889 Other specified postprocedural states: Secondary | ICD-10-CM | POA: Diagnosis not present

## 2015-11-28 DIAGNOSIS — F329 Major depressive disorder, single episode, unspecified: Secondary | ICD-10-CM

## 2015-11-28 DIAGNOSIS — I10 Essential (primary) hypertension: Secondary | ICD-10-CM | POA: Diagnosis not present

## 2015-11-28 DIAGNOSIS — E538 Deficiency of other specified B group vitamins: Secondary | ICD-10-CM

## 2015-11-28 DIAGNOSIS — D649 Anemia, unspecified: Secondary | ICD-10-CM

## 2015-11-28 DIAGNOSIS — E039 Hypothyroidism, unspecified: Secondary | ICD-10-CM

## 2015-11-28 LAB — CBC WITH DIFFERENTIAL/PLATELET
Basophils Absolute: 0 10*3/uL (ref 0.0–0.1)
Basophils Relative: 0.5 % (ref 0.0–3.0)
EOS PCT: 1.4 % (ref 0.0–5.0)
Eosinophils Absolute: 0.1 10*3/uL (ref 0.0–0.7)
HCT: 28.6 % — ABNORMAL LOW (ref 36.0–46.0)
HEMOGLOBIN: 9.6 g/dL — AB (ref 12.0–15.0)
Lymphocytes Relative: 21.8 % (ref 12.0–46.0)
Lymphs Abs: 2 10*3/uL (ref 0.7–4.0)
MCHC: 33.6 g/dL (ref 30.0–36.0)
MCV: 88.8 fl (ref 78.0–100.0)
MONO ABS: 0.4 10*3/uL (ref 0.1–1.0)
MONOS PCT: 4.5 % (ref 3.0–12.0)
Neutro Abs: 6.5 10*3/uL (ref 1.4–7.7)
Neutrophils Relative %: 71.8 % (ref 43.0–77.0)
Platelets: 323 10*3/uL (ref 150.0–400.0)
RBC: 3.22 Mil/uL — AB (ref 3.87–5.11)
RDW: 13.6 % (ref 11.5–15.5)
WBC: 9.1 10*3/uL (ref 4.0–10.5)

## 2015-11-28 LAB — BASIC METABOLIC PANEL
BUN: 21 mg/dL (ref 6–23)
CALCIUM: 9 mg/dL (ref 8.4–10.5)
CO2: 36 meq/L — AB (ref 19–32)
CREATININE: 0.74 mg/dL (ref 0.40–1.20)
Chloride: 92 mEq/L — ABNORMAL LOW (ref 96–112)
GFR: 88.34 mL/min (ref >60.00)
GLUCOSE: 108 mg/dL — AB (ref 70–99)
Potassium: 2.7 mEq/L — CL (ref 3.5–5.1)
Sodium: 137 mEq/L (ref 135–145)

## 2015-11-28 LAB — HEPATIC FUNCTION PANEL
ALBUMIN: 4.1 g/dL (ref 3.5–5.2)
ALK PHOS: 49 U/L (ref 39–117)
ALT: 15 U/L (ref 0–35)
AST: 11 U/L (ref 0–37)
BILIRUBIN DIRECT: 0 mg/dL (ref 0.0–0.3)
Total Bilirubin: 0.4 mg/dL (ref 0.2–1.2)
Total Protein: 6.8 g/dL (ref 6.0–8.3)

## 2015-11-28 LAB — FERRITIN: FERRITIN: 15.6 ng/mL (ref 10.0–291.0)

## 2015-11-28 MED ORDER — BUPROPION HCL ER (XL) 300 MG PO TB24: 300.0000 mg | ORAL_TABLET | Freq: Every day | ORAL | 2 refills | Status: DC

## 2015-11-28 MED ORDER — POTASSIUM CHLORIDE ER 10 MEQ PO TBCR: EXTENDED_RELEASE_TABLET | ORAL | 1 refills | Status: DC

## 2015-11-28 NOTE — Progress Notes (Signed)
Patient ID: Lori Le, female   DOB: 14-Nov-1965, 50 y.o.   MRN: [id number]   Subjective:    Patient ID: Lori Le, female    DOB: [birth year], 50 y.o.   MRN: [id number]  HPI  Patient here for a scheduled follow up.  Last visit, we discussed her increased stress.  See note.  Was started on wellbutrin 150mg  q day.  States she cannot tell a big difference on this medication.  Still with increased stress.  She has talked with her husband about separation.  Discussed with her today.  She also reports that over the weekend (4 days ago).  She woke up and felt sweaty.  Did not feel well.  Started vomiting.  Had what she described as dark/black emesis.  On further questioning, states looked like coffee ground emesis.  Had approximately four episodes.  Lasted a few hours.  Has not had any vomiting since.  Felt weak after.  Has started eating.  Energy starting to improved.  No bowel change.  Took maalox.  Stool dark.  No BRB noted.  No diarrhea now.  Feels she is having a little more swelling in her lower extremities.  Some minimal light headedness when stands.  When gets anxious, notices some chest pressure.  Denies any chest pain or symptoms with increased activity or exertion.     Past Medical History:  Diagnosis Date  . Allergy   . Arthritis   . Complication of anesthesia    nausea, slow to wake up  . GERD (gastroesophageal reflux disease)   . Gout   . History of eating disorder   . Hyperlipidemia   . Hypothyroidism   . Lower extremity edema   . Melanoma in situ ([place])    left shoulder  . Migraine   . PCOS (polycystic ovarian syndrome)   . PONV (postoperative nausea and vomiting)    Past Surgical History:  Procedure Laterality Date  . CHOLECYSTECTOMY  2003  . FOOT SURGERY  1994  . GASTRIC BYPASS  2011  . KNEE ARTHROSCOPY Right   . KNEE ARTHROSCOPY Left 03/23/2015   Procedure: ARTHROSCOPY KNEE, partial synovectomy;  Surgeon: Hessie Knows, MD;  Location: [facility] ORS;  Service:  Orthopedics;  Laterality: Left;  . LAPAROSCOPIC GASTRIC BANDING  2010  . SHOULDER ARTHROSCOPY Left 06/20/2015   Procedure: ARTHROSCOPY SHOULDER, REPAIR OF MASSIVE ROTATOR CUFF TEAR, TENODESIS, DECOMPRESSION DEBRIDEMENT;  Surgeon: Corky Mull, MD;  Location: [facility] ORS;  Service: Orthopedics;  Laterality: Left;  . TOTAL HIP ARTHROPLASTY Left 09/27/2014   Procedure: TOTAL HIP ARTHROPLASTY ANTERIOR APPROACH;  Surgeon: Hessie Knows, MD;  Location: [facility] ORS;  Service: Orthopedics;  Laterality: Left;  . TOTAL KNEE ARTHROPLASTY Left 12/01/2014   Procedure: TOTAL KNEE ARTHROPLASTY;  Surgeon: Hessie Knows, MD;  Location: [facility] ORS;  Service: Orthopedics;  Laterality: Left;   Family History  Problem Relation Age of Onset  . Arthritis Mother   . Hyperlipidemia Mother   . Hypertension Mother   . Diabetes Mother   . Arthritis Father   . Hyperlipidemia Father   . Hypertension Father   . Mental illness Father   . Diabetes Father   . Arthritis Maternal Grandmother   . Cancer Maternal Grandmother     breast cancer  . Hyperlipidemia Maternal Grandmother   . Hypertension Maternal Grandmother   . Arthritis Maternal Grandfather   . Hyperlipidemia Maternal Grandfather   . Hypertension Maternal Grandfather   . Heart disease Maternal Grandfather     heart attack  .  Breast cancer      maternal and paternal grandmother   Social History   Social History  . Marital status: Married    Spouse name: N/A  . Number of children: N/A  . Years of education: N/A   Social History Main Topics  . Smoking status: Never Smoker  . Smokeless tobacco: Never Used  . Alcohol use 0.0 - 0.6 oz/week     Comment: occ.  . Drug use: No  . Sexual activity: No     Comment: [place]   Other Topics Concern  . None   Social History Narrative  . None    Outpatient Encounter Prescriptions as of 11/28/2015  Medication Sig  . furosemide (LASIX) 40 MG tablet TAKE 1 TABLET BY MOUTH EVERY DAY  . levothyroxine (SYNTHROID,  LEVOTHROID) 137 MCG tablet TAKE ONE TABLET BY MOUTH ONCE DAILY BEFORE BREAKFAST  . omeprazole (PRILOSEC) 20 MG capsule Take 1 capsule (20 mg total) by mouth daily. (Patient taking differently: Take 20 mg by mouth daily. This is OTC medication.)  . triamterene-hydrochlorothiazide (MAXZIDE-25) 37.5-25 MG tablet TAKE 1 TABLET BY MOUTH EVERY DAY  . [DISCONTINUED] buPROPion (WELLBUTRIN XL) 150 MG 24 hr tablet Take 1 tablet (150 mg total) by mouth daily.  . [DISCONTINUED] nabumetone (RELAFEN) 750 MG tablet Take by mouth. Reported on 06/12/2015  . buPROPion (WELLBUTRIN XL) 300 MG 24 hr tablet Take 1 tablet (300 mg total) by mouth daily.  . potassium chloride (KLOR-CON 10) 10 MEQ tablet Take one tablet bid for three days and then q day  . [DISCONTINUED] Dermatological Products, Misc. (NUVAIL) SOLN Reported on 06/12/2015  . [DISCONTINUED] Emollient (CERAVE) CREA   . [DISCONTINUED] Fe Fum-FePoly-Vit C-Vit B3 (INTEGRA) 62.5-62.5-40-3 MG CAPS Take 1 capsule by mouth daily.  . [DISCONTINUED] KLOR-CON 10 10 MEQ tablet TAKE ONE TABLET TWICE A DAY FOR TWO DAYS AND THEN ONE TAB EVERY DAY. (Patient not taking: Reported on 11/28/2015)  . [DISCONTINUED] ondansetron (ZOFRAN ODT) 4 MG disintegrating tablet Take 1-2 tablets (4-8 mg total) by mouth every 8 (eight) hours as needed for nausea or vomiting.  . [DISCONTINUED] oxyCODONE (ROXICODONE) 5 MG immediate release tablet Take 1-2 tablets (5-10 mg total) by mouth every 4 (four) hours as needed for severe pain.  . [DISCONTINUED] triamcinolone (KENALOG) 0.025 % cream Reported on 06/12/2015   No facility-administered encounter medications on file as of 11/28/2015.     Review of Systems  Constitutional: Positive for appetite change and fatigue.  HENT: Negative for congestion and sinus pressure.   Respiratory: Positive for chest tightness. Negative for cough and wheezing.   Cardiovascular: Positive for leg swelling. Negative for palpitations.  Gastrointestinal: Positive for  nausea and vomiting. Negative for abdominal pain.       Dark stool and dark emesis.   Genitourinary: Negative for difficulty urinating and dysuria.  Musculoskeletal:       Shoulder doing better.  S/p surgery.    Skin: Negative for color change and rash.  Neurological: Positive for light-headedness and headaches.  Psychiatric/Behavioral: Negative for agitation.       Increased stress as outlined.         Objective:    Physical Exam  Constitutional: She appears well-developed and well-nourished. No distress.  HENT:  Nose: Nose normal.  Mouth/Throat: Oropharynx is clear and moist.  Neck: Neck supple. No thyromegaly present.  Cardiovascular: Normal rate and regular rhythm.   Pulmonary/Chest: Breath sounds normal. No respiratory distress. She has no wheezes.  Abdominal: Soft. Bowel sounds are normal.  There is no tenderness.  Genitourinary:  Genitourinary Comments: Rectal exam - heme negative.   Musculoskeletal: She exhibits no tenderness.  Some pedal and ankle edema.    Lymphadenopathy:    She has no cervical adenopathy.  Skin: No rash noted. No erythema.  Psychiatric: She has a normal mood and affect. Her behavior is normal.    BP 110/74 (BP Location: Left Arm, Patient Position: Sitting, Cuff Size: Large)   Pulse 72   Temp 98.2 F (36.8 C) (Oral)   Resp 16   Wt 208 lb (94.3 kg)   LMP 11/20/2015   BMI 32.58 kg/m  Wt Readings from Last 3 Encounters:  11/28/15 208 lb (94.3 kg)  09/25/15 208 lb 6 oz (94.5 kg)  07/17/15 208 lb (94.3 kg)     Lab Results  Component Value Date   WBC 9.1 11/28/2015   HGB 9.6 (L) 11/28/2015   HCT 28.6 (L) 11/28/2015   PLT 323.0 11/28/2015   GLUCOSE 108 (H) 11/28/2015   CHOL 214 (H) 04/07/2015   TRIG 186 (H) 04/07/2015   HDL 52 04/07/2015   LDLDIRECT 121.0 11/16/2014   LDLCALC 125 (H) 04/07/2015   ALT 15 11/28/2015   AST 11 11/28/2015   NA 137 11/28/2015   K 2.7 (LL) 11/28/2015   CL 92 (L) 11/28/2015   CREATININE 0.74 11/28/2015    BUN 21 11/28/2015   CO2 36 (H) 11/28/2015   TSH 1.40 02/28/2015   INR 1.02 11/17/2014   HGBA1C 5.4 04/07/2015       Assessment & Plan:   Problem List Items Addressed This Visit    Anemia    Has a history of anemia, but last hgb 13.  Recheck cbc today, especially given black emesis, etc.  Follow.        B12 deficiency    Continue b12 injections.       Coffee ground emesis - Primary    Describes the dark vomiting as outlined.  Coffee ground emesis.  Has stopped now.  No vomiting in the last few days.  Discussed ER evaluation.  She wants to hold.  No vomiting now.  Avoid antiinflammatories.  Increase prilosec to bid.  Check cbc.  Refer to GI.  Heme negative on exam.  Any change or worsening symptoms, needs to be evaluated immediately.        Relevant Orders   Ambulatory referral to Gastroenterology   Depression    Discussed with her today.  Increased stress related to her family issues.  Increase wellbutrin to 300mg  q day.  Follow.        Relevant Medications   buPROPion (WELLBUTRIN XL) 300 MG 24 hr tablet   Edema    She is on lasix and triam/hctz.  She wanted to increase.  Hold on changing medicaation.  Exam as outlined.  Compression hose.  Follow.        Essential hypertension, benign    Blood pressure as outlined.  Continue same medication regimen.  Follow pressures.  Check metabolic panel.        H/O gastric bypass    Check cbc today.        Hypercholesterolemia    Low cholesterol diet and exercise.  Follow lipid panel.        Hypothyroidism    On thyroid replacement.  Follow tsh.       Migraines    Has a history of migraines.  With the recent episode of vomiting, noticed headache then.  Follow.  Avoid  antiinflammatories.  Can use tylenol.        Relevant Medications   buPROPion (WELLBUTRIN XL) 300 MG 24 hr tablet    Other Visit Diagnoses   None.      Einar Pheasant, MD

## 2015-11-28 NOTE — Telephone Encounter (Signed)
[facility] called with critical result on patient. Patient's potassium came back at 2.7.

## 2015-11-29 ENCOUNTER — Other Ambulatory Visit (INDEPENDENT_AMBULATORY_CARE_PROVIDER_SITE_OTHER): Payer: 59

## 2015-11-29 ENCOUNTER — Other Ambulatory Visit: Payer: Self-pay | Admitting: Internal Medicine

## 2015-11-29 ENCOUNTER — Encounter: Payer: Self-pay | Admitting: Internal Medicine

## 2015-11-29 DIAGNOSIS — D649 Anemia, unspecified: Secondary | ICD-10-CM | POA: Diagnosis not present

## 2015-11-29 DIAGNOSIS — E876 Hypokalemia: Secondary | ICD-10-CM

## 2015-11-29 DIAGNOSIS — K920 Hematemesis: Secondary | ICD-10-CM | POA: Insufficient documentation

## 2015-11-29 LAB — IBC PANEL
IRON: 17 ug/dL — AB (ref 42–145)
Saturation Ratios: 3.8 % — ABNORMAL LOW (ref 20.0–50.0)
TRANSFERRIN: 316 mg/dL (ref 212.0–360.0)

## 2015-11-29 LAB — VITAMIN B12: VITAMIN B 12: 367 pg/mL (ref 211–911)

## 2015-11-29 NOTE — Assessment & Plan Note (Addendum)
She is on lasix and triam/hctz.  She wanted to increase.  Hold on changing medicaation.  Exam as outlined.  Compression hose.  Follow.

## 2015-11-29 NOTE — Telephone Encounter (Signed)
I called and scheduled pt for repeat potassium on 12/05/15. Would you like a straight potassium or BMET ordered?

## 2015-11-29 NOTE — Assessment & Plan Note (Signed)
Check cbc today 

## 2015-11-29 NOTE — Assessment & Plan Note (Signed)
Has a history of migraines.  With the recent episode of vomiting, noticed headache then.  Follow.  Avoid antiinflammatories.  Can use tylenol.

## 2015-11-29 NOTE — Assessment & Plan Note (Signed)
Blood pressure as outlined.  Continue same medication regimen.  Follow pressures.  Check metabolic panel.

## 2015-11-29 NOTE — Assessment & Plan Note (Signed)
Describes the dark vomiting as outlined.  Coffee ground emesis.  Has stopped now.  No vomiting in the last few days.  Discussed ER evaluation.  She wants to hold.  No vomiting now.  Avoid antiinflammatories.  Increase prilosec to bid.  Check cbc.  Refer to GI.  Heme negative on exam.  Any change or worsening symptoms, needs to be evaluated immediately.

## 2015-11-29 NOTE — Assessment & Plan Note (Signed)
Continue b12 injections.  

## 2015-11-29 NOTE — Telephone Encounter (Signed)
I have already ordered f/u cbc and potassium.

## 2015-11-29 NOTE — Assessment & Plan Note (Signed)
Has a history of anemia, but last hgb 13.  Recheck cbc today, especially given black emesis, etc.  Follow.

## 2015-11-29 NOTE — Assessment & Plan Note (Signed)
On thyroid replacement.  Follow tsh.  

## 2015-11-29 NOTE — Telephone Encounter (Signed)
Pt notified 11/28/15 of potassium and hgb (and other lab results).  Potassium supplements called in.  Pt instructed on directions for taking potassium.  Needs a f/u potassium lab on Monday.  Please schedule.  Thanks

## 2015-11-29 NOTE — Assessment & Plan Note (Signed)
Low cholesterol diet and exercise.  Follow lipid panel.   

## 2015-11-29 NOTE — Assessment & Plan Note (Signed)
Discussed with her today.  Increased stress related to her family issues.  Increase wellbutrin to 300mg  q day.  Follow.

## 2015-12-01 ENCOUNTER — Encounter: Payer: Self-pay | Admitting: *Deleted

## 2015-12-05 ENCOUNTER — Ambulatory Visit
Admission: RE | Admit: 2015-12-05 | Discharge: 2015-12-05 | Disposition: A | Discharge status: Home / Self Care | Payer: 59 | Source: Ambulatory Visit | Attending: Gastroenterology | Admitting: Gastroenterology

## 2015-12-05 ENCOUNTER — Ambulatory Visit: Payer: 59 | Admitting: Anesthesiology

## 2015-12-05 ENCOUNTER — Other Ambulatory Visit: Payer: Self-pay

## 2015-12-05 ENCOUNTER — Encounter: Payer: Self-pay | Admitting: *Deleted

## 2015-12-05 ENCOUNTER — Encounter
Admission: RE | Disposition: A | Discharge status: Home / Self Care | Payer: Self-pay | Source: Ambulatory Visit | Attending: Gastroenterology

## 2015-12-05 DIAGNOSIS — I10 Essential (primary) hypertension: Secondary | ICD-10-CM | POA: Insufficient documentation

## 2015-12-05 DIAGNOSIS — E039 Hypothyroidism, unspecified: Secondary | ICD-10-CM | POA: Diagnosis not present

## 2015-12-05 DIAGNOSIS — K219 Gastro-esophageal reflux disease without esophagitis: Secondary | ICD-10-CM | POA: Insufficient documentation

## 2015-12-05 DIAGNOSIS — Z6832 Body mass index (BMI) 32.0-32.9, adult: Secondary | ICD-10-CM | POA: Diagnosis not present

## 2015-12-05 DIAGNOSIS — K921 Melena: Secondary | ICD-10-CM | POA: Insufficient documentation

## 2015-12-05 DIAGNOSIS — K920 Hematemesis: Secondary | ICD-10-CM | POA: Insufficient documentation

## 2015-12-05 DIAGNOSIS — E669 Obesity, unspecified: Secondary | ICD-10-CM | POA: Insufficient documentation

## 2015-12-05 DIAGNOSIS — K228 Other specified diseases of esophagus: Secondary | ICD-10-CM | POA: Diagnosis not present

## 2015-12-05 DIAGNOSIS — M1990 Unspecified osteoarthritis, unspecified site: Secondary | ICD-10-CM | POA: Diagnosis not present

## 2015-12-05 DIAGNOSIS — Z79899 Other long term (current) drug therapy: Secondary | ICD-10-CM | POA: Diagnosis not present

## 2015-12-05 DIAGNOSIS — Z9884 Bariatric surgery status: Secondary | ICD-10-CM | POA: Diagnosis not present

## 2015-12-05 HISTORY — DX: Idiopathic urticaria: L50.1

## 2015-12-05 HISTORY — DX: Rheumatic mitral valve disease, unspecified: I05.9

## 2015-12-05 HISTORY — DX: Autoimmune thyroiditis: E06.3

## 2015-12-05 HISTORY — DX: Allergic rhinitis, unspecified: J30.9

## 2015-12-05 HISTORY — PX: ESOPHAGOGASTRODUODENOSCOPY (EGD) WITH PROPOFOL: SHX5813

## 2015-12-05 HISTORY — DX: Urticaria, unspecified: L50.9

## 2015-12-05 HISTORY — DX: Anemia, unspecified: D64.9

## 2015-12-05 LAB — POCT PREGNANCY, URINE: Preg Test, Ur: NEGATIVE

## 2015-12-05 SURGERY — ESOPHAGOGASTRODUODENOSCOPY (EGD) WITH PROPOFOL
Anesthesia: General

## 2015-12-05 MED ORDER — SODIUM CHLORIDE 0.9 % IV SOLN: INTRAVENOUS | Status: DC

## 2015-12-05 MED ORDER — PROPOFOL 10 MG/ML IV BOLUS
INTRAVENOUS | Status: DC | PRN
  Administered 2015-12-05: 90 mg via INTRAVENOUS

## 2015-12-05 MED ORDER — PHENYLEPHRINE HCL 10 MG/ML IJ SOLN
INTRAMUSCULAR | Status: DC | PRN
  Administered 2015-12-05: 100 ug via INTRAVENOUS

## 2015-12-05 MED ORDER — FENTANYL CITRATE (PF) 100 MCG/2ML IJ SOLN
INTRAMUSCULAR | Status: DC | PRN
Start: 2015-12-05 — End: 2015-12-05
  Administered 2015-12-05: 50 ug via INTRAVENOUS

## 2015-12-05 MED ORDER — PROPOFOL 500 MG/50ML IV EMUL
INTRAVENOUS | Status: DC | PRN
  Administered 2015-12-05: 120 ug/kg/min via INTRAVENOUS

## 2015-12-05 MED ORDER — SODIUM CHLORIDE 0.9 % IV SOLN
2.0000 g | Freq: Four times a day (QID) | INTRAVENOUS | Status: DC
  Administered 2015-12-05: 2 g via INTRAVENOUS
  Filled 2015-12-05: qty 2000

## 2015-12-05 MED ORDER — SODIUM CHLORIDE 0.9 % IV SOLN
INTRAVENOUS | Status: DC
Start: 2015-12-05 — End: 2015-12-05
  Administered 2015-12-05: 15:00:00 via INTRAVENOUS

## 2015-12-05 NOTE — Anesthesia Postprocedure Evaluation (Signed)
Anesthesia Post Note  Patient: TAYLI PLACZEK  Procedure(s) Performed: Procedure(s) (LRB): ESOPHAGOGASTRODUODENOSCOPY (EGD) WITH PROPOFOL (N/A)  Patient location during evaluation: Endoscopy Anesthesia Type: General Level of consciousness: awake and alert Pain management: pain level controlled Vital Signs Assessment: post-procedure vital signs reviewed and stable Respiratory status: spontaneous breathing and respiratory function stable Cardiovascular status: stable Anesthetic complications: no    Last Vitals:  Vitals:   12/05/15 1600 12/05/15 1610  BP: (!) 107/55 (!) 109/58  Pulse: 74 76  Resp: 16 14  Temp:      Last Pain:  Vitals:   12/05/15 1543  TempSrc: Tympanic                 KEPHART,WILLIAM K

## 2015-12-05 NOTE — Op Note (Signed)
[facility] Gastroenterology Patient Name: [name] Procedure Date: 12/05/2015 3:13 PM MRN: [id number] Account #: [id number] Date of Birth: Dec 28, 1965 Admit Type: Outpatient Age: 50 Room: [facility] ENDO ROOM 3 Gender: Female Note Status: Finalized Procedure:            Upper GI endoscopy Indications:          Coffee-ground emesis, Hematemesis, Melena Providers:            Lollie Sails, MD Referring MD:         Einar Pheasant, MD (Referring MD) Medicines:            Monitored Anesthesia Care Complications:        No immediate complications. Procedure:            Pre-Anesthesia Assessment:                       - ASA Grade Assessment: III - A patient with severe                        systemic disease.                       After obtaining informed consent, the endoscope was                        passed under direct vision. Throughout the procedure,                        the patient's blood pressure, pulse, and oxygen                        saturations were monitored continuously. The Endoscope                        was introduced through the mouth, and advanced to the                        afferent and efferent jejunal loops. The upper GI                        endoscopy was accomplished without difficulty. The                        patient tolerated the procedure well. Findings:      The Z-line was variable.      The exam of the esophagus was otherwise normal.      Evidence of a gastric bypass was found. A gastric pouch with a 8.5 cm       length from the GE junction to the gastrojejunal anastomosis was found.       The gastrojejunal anastomosis was characterized by edema and erythema.       There are 3 ulcers with white eschar in the base, 2 post anastomotic,       one anastomotic, all near the opening of the efferent limb. I did not       pass into the efferent limb due to the proximity of the ulceration,       however no other lesions were noted  inside of the limp; on close       insp-ection. . The jejunal opening semed small however was noted to open  more widely with motility. There is no active bleeding throughout. Impression:           - Z-line variable.                       - Gastric bypass with a pouch 8.5 cm in length.                        Gastrojejunal anastomosis characterized by edema and                        erythema.                       - No specimens collected. Recommendation:       - Discharge patient to home.                       - Use Protonix (pantoprazole) 40 mg PO BID for 1 month.                       - Use Protonix (pantoprazole) 40 mg PO daily daily.                       - Use sucralfate tablets 1 gram PO QID for 4 weeks.                       - Consider repeat EGD in 7 weeks.                       - Perform an H. pylori stool antigen (HpSA) test at                        appointment to be scheduled. Procedure Code(s):    --- Professional ---                       [phone], Esophagogastroduodenoscopy, flexible, transoral;                        diagnostic, including collection of specimen(s) by                        brushing or washing, when performed (separate procedure) Diagnosis Code(s):    --- Professional ---                       K22.8, Other specified diseases of esophagus                       Z98.84, Bariatric surgery status                       K92.0, Hematemesis                       K92.1, Melena (includes Hematochezia) CPT copyright 2016 American Medical Association. All rights reserved. The codes documented in this report are preliminary and upon coder review may  be revised to meet current compliance requirements. Lollie Sails, MD 12/05/2015 3:48:10 PM This report has been signed electronically. Number of Addenda: 0 Note Initiated On: 12/05/2015 3:13 PM      [facility]

## 2015-12-05 NOTE — H&P (Signed)
Outpatient short stay form Pre-procedure 12/05/2015 3:17 PM Lollie Sails MD  Primary Physician: Dr. Einar Pheasant  Reason for visit:  Hematemesis/coffee-ground emesis  History of present illness:  Patient is a 50 year old female who is sent the outpatient clinic record of black stool and some coffee-ground emesis. This occurred about a week ago    Current Facility-Administered Medications:  .  0.9 %  sodium chloride infusion, , Intravenous, Continuous, Lollie Sails, MD .  0.9 %  sodium chloride infusion, , Intravenous, Continuous, Lollie Sails, MD .  ampicillin (OMNIPEN) 2 g in sodium chloride 0.9 % 50 mL IVPB, 2 g, Intravenous, Q6H, Lollie Sails, MD, 2 g at 12/05/15 1356  Prescriptions Prior to Admission  Medication Sig Dispense Refill Last Dose  . buPROPion (WELLBUTRIN XL) 300 MG 24 hr tablet Take 1 tablet (300 mg total) by mouth daily. 30 tablet 2 Past Week at Unknown time  . furosemide (LASIX) 40 MG tablet TAKE 1 TABLET BY MOUTH EVERY DAY 90 tablet 3 12/04/2015 at Unknown time  . levothyroxine (SYNTHROID, LEVOTHROID) 137 MCG tablet TAKE ONE TABLET BY MOUTH ONCE DAILY BEFORE BREAKFAST 90 tablet 0 12/04/2015 at Unknown time  . omeprazole (PRILOSEC) 20 MG capsule Take 1 capsule (20 mg total) by mouth daily. (Patient taking differently: Take 20 mg by mouth daily. This is OTC medication.) 90 capsule 1 12/04/2015 at Unknown time  . potassium chloride (KLOR-CON 10) 10 MEQ tablet Take one tablet bid for three days and then q day 30 tablet 1 12/04/2015 at Unknown time  . triamterene-hydrochlorothiazide (MAXZIDE-25) 37.5-25 MG tablet TAKE 1 TABLET BY MOUTH EVERY DAY 90 tablet 1 12/04/2015 at Unknown time  . pantoprazole (PROTONIX) 40 MG tablet Take 40 mg by mouth 2 (two) times daily before a meal.   Not Taking at Unknown time  . triamcinolone (KENALOG) 0.025 % cream Apply 1 application topically 2 (two) times daily.   Completed Course at Unknown time     Allergies  Allergen  Reactions  . Influenza Vaccines Hives  . Lactose Nausea And Vomiting    Can have yogurt Can not have milk and ice cream   . Lactose Intolerance (Gi) Nausea And Vomiting    Can have yogurt Can not have milk and ice cream    . Bacitracin-Neomycin-Polymyxin Rash  . Neosporin + Pain Relief Max St [Neomy-Bacit-Polymyx-Pramoxine] Rash     Past Medical History:  Diagnosis Date  . Allergic rhinitis due to allergen   . Allergy   . Anemia   . Arthritis   . Chronic idiopathic urticaria   . Complication of anesthesia    nausea, slow to wake up  . GERD (gastroesophageal reflux disease)   . Gout   . Hashimoto's thyroiditis   . History of eating disorder   . Hives   . Hyperlipidemia   . Hypertension   . Hypothyroidism   . Lower extremity edema   . Melanoma in situ ([place])    left shoulder  . Migraine   . Mitral valve disorder   . PCOS (polycystic ovarian syndrome)   . PONV (postoperative nausea and vomiting)     Review of systems:      Physical Exam    Heart and lungs: Regular rate and rhythm without rub or gallop lungs are bilaterally clear    HEENT: Normocephalic atraumatic eyes are anicteric    Other:     Pertinant exam for procedure: Soft nontender nondistended bowel sounds positive normoactive.    Planned  proceedures: EGD and indicated procedures. I have discussed the risks benefits and complications of procedures to include not limited to bleeding, infection, perforation and the risk of sedation and the patient wishes to proceed.    Lollie Sails, MD Gastroenterology 12/05/2015  3:17 PM

## 2015-12-05 NOTE — Transfer of Care (Signed)
Immediate Anesthesia Transfer of Care Note  Patient: Lori Le  Procedure(s) Performed: Procedure(s): ESOPHAGOGASTRODUODENOSCOPY (EGD) WITH PROPOFOL (N/A)  Patient Location: PACU and Endoscopy Unit  Anesthesia Type:General  Level of Consciousness: awake, alert  and oriented  Airway & Oxygen Therapy: Patient Spontanous Breathing and Patient connected to nasal cannula oxygen  Post-op Assessment: Report given to RN and Post -op Vital signs reviewed and stable  Post vital signs: Reviewed and stable  Last Vitals:  Vitals:   12/05/15 1541 12/05/15 1543  BP:  (!) 99/47  Pulse: 83 86  Resp: 20 18  Temp: 36.1 C 36.3 C    Last Pain:  Vitals:   12/05/15 1543  TempSrc: Tympanic         Complications: No apparent anesthesia complications

## 2015-12-05 NOTE — Anesthesia Preprocedure Evaluation (Signed)
Anesthesia Evaluation  Patient identified by MRN, date of birth, ID band Patient awake    Reviewed: Allergy & Precautions, NPO status , Patient's Chart, lab work & pertinent test results  History of Anesthesia Complications (+) PONV  Airway Mallampati: II  TM Distance: >3 FB Neck ROM: Full    Dental no notable dental hx.    Pulmonary neg pulmonary ROS, neg sleep apnea, neg COPD,    breath sounds clear to auscultation- rhonchi (-) wheezing      Cardiovascular hypertension, Pt. on medications (-) CAD and (-) Past MI  Rhythm:Regular Rate:Normal - Systolic murmurs and - Diastolic murmurs    Neuro/Psych  Headaches, Depression    GI/Hepatic Neg liver ROS, GERD  ,Hx of gastric bypass   Endo/Other  neg diabetesHypothyroidism   Renal/GU negative Renal ROS     Musculoskeletal  (+) Arthritis , Osteoarthritis,    Abdominal (+) + obese,   Peds  Hematology  (+) anemia ,   Anesthesia Other Findings Past Medical History: No date: Allergic rhinitis due to allergen No date: Allergy No date: Anemia No date: Arthritis No date: Chronic idiopathic urticaria No date: Complication of anesthesia     Comment: nausea, slow to wake up No date: GERD (gastroesophageal reflux disease) No date: Gout No date: Hashimoto's thyroiditis No date: History of eating disorder No date: Hives No date: Hyperlipidemia No date: Hypertension No date: Hypothyroidism No date: Lower extremity edema No date: Melanoma in situ ([place])     Comment: left shoulder No date: Migraine No date: Mitral valve disorder No date: PCOS (polycystic ovarian syndrome) No date: PONV (postoperative nausea and vomiting)   Reproductive/Obstetrics                             Anesthesia Physical Anesthesia Plan  ASA: II  Anesthesia Plan: General   Post-op Pain Management:    Induction: Intravenous  Airway Management Planned: Natural  Airway  Additional Equipment:   Intra-op Plan:   Post-operative Plan:   Informed Consent: I have reviewed the patients History and Physical, chart, labs and discussed the procedure including the risks, benefits and alternatives for the proposed anesthesia with the patient or authorized representative who has indicated his/her understanding and acceptance.   Dental advisory given  Plan Discussed with: CRNA and Anesthesiologist  Anesthesia Plan Comments:         Anesthesia Quick Evaluation

## 2015-12-06 ENCOUNTER — Ambulatory Visit (INDEPENDENT_AMBULATORY_CARE_PROVIDER_SITE_OTHER): Payer: 59

## 2015-12-06 ENCOUNTER — Other Ambulatory Visit (INDEPENDENT_AMBULATORY_CARE_PROVIDER_SITE_OTHER): Payer: 59

## 2015-12-06 ENCOUNTER — Encounter: Payer: Self-pay | Admitting: Gastroenterology

## 2015-12-06 DIAGNOSIS — E876 Hypokalemia: Secondary | ICD-10-CM | POA: Diagnosis not present

## 2015-12-06 DIAGNOSIS — E538 Deficiency of other specified B group vitamins: Secondary | ICD-10-CM

## 2015-12-06 DIAGNOSIS — D649 Anemia, unspecified: Secondary | ICD-10-CM | POA: Diagnosis not present

## 2015-12-06 MED ORDER — CYANOCOBALAMIN 1000 MCG/ML IJ SOLN
1000.0000 ug | Freq: Once | INTRAMUSCULAR | Status: AC
  Administered 2015-12-06: 1000 ug via INTRAMUSCULAR

## 2015-12-06 NOTE — Progress Notes (Addendum)
Patient came in for a B12 injection.  Received in right deltoid.  Patient tolerated well.    Agree with above.   Dr Nicki Reaper

## 2015-12-07 LAB — POTASSIUM: POTASSIUM: 3.6 meq/L (ref 3.5–5.1)

## 2015-12-07 LAB — CBC WITH DIFFERENTIAL/PLATELET
Basophils Absolute: 0 10*3/uL (ref 0.0–0.1)
Basophils Relative: 0.3 % (ref 0.0–3.0)
EOS PCT: 1.9 % (ref 0.0–5.0)
Eosinophils Absolute: 0.1 10*3/uL (ref 0.0–0.7)
LYMPHS ABS: 2.1 10*3/uL (ref 0.7–4.0)
LYMPHS PCT: 29.4 % (ref 12.0–46.0)
MCHC: 33.4 g/dL (ref 30.0–36.0)
MCV: 85.9 fl (ref 78.0–100.0)
MONOS PCT: 4.9 % (ref 3.0–12.0)
Monocytes Absolute: 0.4 10*3/uL (ref 0.1–1.0)
Neutro Abs: 4.6 10*3/uL (ref 1.4–7.7)
Neutrophils Relative %: 63.5 % (ref 43.0–77.0)
Platelets: 386 10*3/uL (ref 150.0–400.0)
RBC: 2.98 Mil/uL — AB (ref 3.87–5.11)
RDW: 13.2 % (ref 11.5–15.5)
WBC: 7.3 10*3/uL (ref 4.0–10.5)

## 2015-12-12 ENCOUNTER — Other Ambulatory Visit: Payer: Self-pay | Admitting: Internal Medicine

## 2015-12-12 DIAGNOSIS — D649 Anemia, unspecified: Secondary | ICD-10-CM

## 2015-12-12 DIAGNOSIS — E876 Hypokalemia: Secondary | ICD-10-CM

## 2015-12-24 ENCOUNTER — Other Ambulatory Visit: Payer: Self-pay | Admitting: Internal Medicine

## 2015-12-27 ENCOUNTER — Other Ambulatory Visit: Payer: Self-pay

## 2016-01-09 ENCOUNTER — Ambulatory Visit: Payer: Self-pay | Admitting: Internal Medicine

## 2016-01-21 ENCOUNTER — Other Ambulatory Visit: Payer: Self-pay | Admitting: Internal Medicine

## 2016-01-21 DIAGNOSIS — Z760 Encounter for issue of repeat prescription: Secondary | ICD-10-CM

## 2016-01-23 ENCOUNTER — Other Ambulatory Visit (INDEPENDENT_AMBULATORY_CARE_PROVIDER_SITE_OTHER): Payer: 59

## 2016-01-23 DIAGNOSIS — D649 Anemia, unspecified: Secondary | ICD-10-CM

## 2016-01-23 DIAGNOSIS — E876 Hypokalemia: Secondary | ICD-10-CM | POA: Diagnosis not present

## 2016-01-23 DIAGNOSIS — E7800 Pure hypercholesterolemia, unspecified: Secondary | ICD-10-CM

## 2016-01-23 LAB — LIPID PANEL
Cholesterol: 185 mg/dL (ref 0–200)
HDL: 57.8 mg/dL (ref >39.00)
LDL Cholesterol: 93 mg/dL (ref 0–99)
NONHDL: 127.67
TRIGLYCERIDES: 173 mg/dL — AB (ref 0.0–149.0)
Total CHOL/HDL Ratio: 3
VLDL: 34.6 mg/dL (ref 0.0–40.0)

## 2016-01-23 LAB — BASIC METABOLIC PANEL
BUN: 14 mg/dL (ref 6–23)
CALCIUM: 9.1 mg/dL (ref 8.4–10.5)
CHLORIDE: 100 meq/L (ref 96–112)
CO2: 30 meq/L (ref 19–32)
CREATININE: 0.71 mg/dL (ref 0.40–1.20)
GFR: 92.61 mL/min (ref >60.00)
GLUCOSE: 92 mg/dL (ref 70–99)
Potassium: 3.3 mEq/L — ABNORMAL LOW (ref 3.5–5.1)
Sodium: 141 mEq/L (ref 135–145)

## 2016-01-23 LAB — HEMOGLOBIN: HEMOGLOBIN: 9.9 g/dL — AB (ref 12.0–15.0)

## 2016-01-23 LAB — FERRITIN: Ferritin: 5.7 ng/mL — ABNORMAL LOW (ref 10.0–291.0)

## 2016-01-24 ENCOUNTER — Other Ambulatory Visit: Payer: Self-pay | Admitting: Internal Medicine

## 2016-01-26 ENCOUNTER — Encounter: Payer: Self-pay | Admitting: Internal Medicine

## 2016-01-26 ENCOUNTER — Ambulatory Visit (INDEPENDENT_AMBULATORY_CARE_PROVIDER_SITE_OTHER): Payer: 59 | Admitting: Internal Medicine

## 2016-01-26 DIAGNOSIS — E669 Obesity, unspecified: Secondary | ICD-10-CM

## 2016-01-26 DIAGNOSIS — M75102 Unspecified rotator cuff tear or rupture of left shoulder, not specified as traumatic: Secondary | ICD-10-CM | POA: Diagnosis not present

## 2016-01-26 DIAGNOSIS — E039 Hypothyroidism, unspecified: Secondary | ICD-10-CM

## 2016-01-26 DIAGNOSIS — E538 Deficiency of other specified B group vitamins: Secondary | ICD-10-CM

## 2016-01-26 DIAGNOSIS — F329 Major depressive disorder, single episode, unspecified: Secondary | ICD-10-CM

## 2016-01-26 DIAGNOSIS — E7800 Pure hypercholesterolemia, unspecified: Secondary | ICD-10-CM

## 2016-01-26 DIAGNOSIS — F32A Depression, unspecified: Secondary | ICD-10-CM

## 2016-01-26 DIAGNOSIS — I10 Essential (primary) hypertension: Secondary | ICD-10-CM

## 2016-01-26 DIAGNOSIS — D649 Anemia, unspecified: Secondary | ICD-10-CM

## 2016-01-26 NOTE — Progress Notes (Signed)
Pre visit review using our clinic review tool, if applicable. No additional management support is needed unless otherwise documented below in the visit note. 

## 2016-01-26 NOTE — Progress Notes (Signed)
Patient ID: Lori Le, female   DOB: May 09, 1965, 50 y.o.   MRN: [id number]   Subjective:    Patient ID: Lori Le, female    DOB: 06-08-1965, 50 y.o.   MRN: [id number]  HPI  Patient here for a scheduled follow up.  She previously had hematemesis.  See previous note.  She saw GI.  On protonix and carafate.  Still having some spasms.  No abdominal pain or cramping.  Bowels stable.  Was previously started on iron.  Did not tolerate.  No chest pain. No sob.  Due f/u EGD 01/2016.  Last hgb improved.     Past Medical History:  Diagnosis Date  . Allergic rhinitis due to allergen   . Allergy   . Anemia   . Arthritis   . Chronic idiopathic urticaria   . Complication of anesthesia    nausea, slow to wake up  . GERD (gastroesophageal reflux disease)   . Gout   . Hashimoto's thyroiditis   . History of eating disorder   . Hives   . Hyperlipidemia   . Hypertension   . Hypothyroidism   . Lower extremity edema   . Melanoma in situ ([place])    left shoulder  . Migraine   . Mitral valve disorder   . PCOS (polycystic ovarian syndrome)   . PONV (postoperative nausea and vomiting)    Past Surgical History:  Procedure Laterality Date  . CHOLECYSTECTOMY  2003  . ESOPHAGOGASTRODUODENOSCOPY (EGD) WITH PROPOFOL N/A 12/05/2015   Procedure: ESOPHAGOGASTRODUODENOSCOPY (EGD) WITH PROPOFOL;  Surgeon: Lollie Sails, MD;  Location: [facility] ENDOSCOPY;  Service: Endoscopy;  Laterality: N/A;  . FOOT SURGERY  1994  . GASTRIC BYPASS  2011  . JOINT REPLACEMENT Left 09/27/2014   HIP  . JOINT REPLACEMENT Left 12/01/2014   KNEE  . KNEE ARTHROSCOPY Right   . KNEE ARTHROSCOPY Left 03/23/2015   Procedure: ARTHROSCOPY KNEE, partial synovectomy;  Surgeon: Hessie Knows, MD;  Location: [facility] ORS;  Service: Orthopedics;  Laterality: Left;  . LAPAROSCOPIC GASTRIC BANDING  2010  . SHOULDER ARTHROSCOPY Left 06/20/2015   Procedure: ARTHROSCOPY SHOULDER, REPAIR OF MASSIVE ROTATOR CUFF TEAR, TENODESIS, DECOMPRESSION  DEBRIDEMENT;  Surgeon: Corky Mull, MD;  Location: [facility] ORS;  Service: Orthopedics;  Laterality: Left;  . TOTAL HIP ARTHROPLASTY Left 09/27/2014   Procedure: TOTAL HIP ARTHROPLASTY ANTERIOR APPROACH;  Surgeon: Hessie Knows, MD;  Location: [facility] ORS;  Service: Orthopedics;  Laterality: Left;  . TOTAL KNEE ARTHROPLASTY Left 12/01/2014   Procedure: TOTAL KNEE ARTHROPLASTY;  Surgeon: Hessie Knows, MD;  Location: [facility] ORS;  Service: Orthopedics;  Laterality: Left;   Family History  Problem Relation Age of Onset  . Arthritis Mother   . Hyperlipidemia Mother   . Hypertension Mother   . Diabetes Mother   . Arthritis Father   . Hyperlipidemia Father   . Hypertension Father   . Mental illness Father   . Diabetes Father   . Arthritis Maternal Grandmother   . Cancer Maternal Grandmother     breast cancer  . Hyperlipidemia Maternal Grandmother   . Hypertension Maternal Grandmother   . Arthritis Maternal Grandfather   . Hyperlipidemia Maternal Grandfather   . Hypertension Maternal Grandfather   . Heart disease Maternal Grandfather     heart attack  . Breast cancer      maternal and paternal grandmother   Social History   Social History  . Marital status: Married    Spouse name: N/A  . Number of children: N/A  .  Years of education: N/A   Social History Main Topics  . Smoking status: Never Smoker  . Smokeless tobacco: Never Used  . Alcohol use 0.0 - 0.6 oz/week     Comment: occ.  . Drug use: No  . Sexual activity: No     Comment: [place]   Other Topics Concern  . None   Social History Narrative  . None    Outpatient Encounter Prescriptions as of 01/26/2016  Medication Sig  . buPROPion (WELLBUTRIN XL) 300 MG 24 hr tablet Take 1 tablet (300 mg total) by mouth daily.  . furosemide (LASIX) 40 MG tablet TAKE 1 TABLET BY MOUTH EVERY DAY  . KLOR-CON 10 10 MEQ tablet TAKE ONE TABLET TWICE FOR THREE DAYS AND THEN DAILY  . levothyroxine (SYNTHROID, LEVOTHROID) 137 MCG tablet TAKE ONE  TABLET BY MOUTH ONCE DAILY BEFORE BREAKFAST  . levothyroxine (SYNTHROID, LEVOTHROID) 137 MCG tablet TAKE ONE TABLET BY MOUTH ONCE DAILY BEFORE BREAKFAST  . omeprazole (PRILOSEC) 20 MG capsule Take 1 capsule (20 mg total) by mouth daily. (Patient taking differently: Take 20 mg by mouth daily. This is OTC medication.)  . pantoprazole (PROTONIX) 40 MG tablet Take 40 mg by mouth 2 (two) times daily before a meal.  . triamcinolone (KENALOG) 0.025 % cream Apply 1 application topically 2 (two) times daily.  [name] triamterene-hydrochlorothiazide (MAXZIDE-25) 37.5-25 MG tablet TAKE 1 TABLET BY MOUTH EVERY DAY   No facility-administered encounter medications on file as of 01/26/2016.     Review of Systems  Constitutional: Negative for appetite change and unexpected weight change.  HENT: Negative for congestion and sinus pressure.   Respiratory: Negative for cough, chest tightness and shortness of breath.   Cardiovascular: Negative for chest pain, palpitations and leg swelling.  Gastrointestinal: Negative for abdominal pain, diarrhea, nausea and vomiting.  Genitourinary: Negative for difficulty urinating and dysuria.  Musculoskeletal:       Shoulder doing better.  Limited rom.  Discharged from physical therapy.    Skin: Negative for color change and rash.  Neurological: Negative for dizziness, light-headedness and headaches.  Psychiatric/Behavioral: Negative for agitation and dysphoric mood.       Increased stress with her separation.  Discussed with her today.        Objective:    Physical Exam  Constitutional: She appears well-developed and well-nourished. No distress.  HENT:  Nose: Nose normal.  Mouth/Throat: Oropharynx is clear and moist.  Neck: Neck supple. No thyromegaly present.  Cardiovascular: Normal rate and regular rhythm.   Pulmonary/Chest: Breath sounds normal. No respiratory distress. She has no wheezes.  Abdominal: Soft. Bowel sounds are normal. There is no tenderness.    Musculoskeletal: She exhibits no edema or tenderness.  Lymphadenopathy:    She has no cervical adenopathy.  Skin: No rash noted. No erythema.  Psychiatric: She has a normal mood and affect. Her behavior is normal.    BP 126/82   Pulse 83   Temp 98.6 F (37 C) (Oral)   Ht 5\' 8"  (1.727 m)   Wt 209 lb (94.8 kg)   LMP 01/22/2016   SpO2 97%   BMI 31.78 kg/m  Wt Readings from Last 3 Encounters:  01/26/16 209 lb (94.8 kg)  12/05/15 207 lb (93.9 kg)  11/28/15 208 lb (94.3 kg)     Lab Results  Component Value Date   WBC 7.3 12/06/2015   HGB 9.9 (L) 01/23/2016   HCT 25.6 Repeated and verified X2. (L) 12/06/2015   PLT 386.0 12/06/2015  GLUCOSE 92 01/23/2016   CHOL 185 01/23/2016   TRIG 173.0 (H) 01/23/2016   HDL 57.80 01/23/2016   LDLDIRECT 121.0 11/16/2014   LDLCALC 93 01/23/2016   ALT 15 11/28/2015   AST 11 11/28/2015   NA 141 01/23/2016   K 3.3 (L) 01/23/2016   CL 100 01/23/2016   CREATININE 0.71 01/23/2016   BUN 14 01/23/2016   CO2 30 01/23/2016   TSH 1.40 02/28/2015   INR 1.02 11/17/2014   HGBA1C 5.4 04/07/2015       Assessment & Plan:   Problem List Items Addressed This Visit    Anemia    Recently had EGD.  Ulcers present. On protonix and carafate.  Planning for f/u EGD in 01/2016.  Unable to tolerate iron.  Last hgb improved.  Follow cbc.        Relevant Orders   CBC with Differential/Platelet   Ferritin   B12 deficiency    Continue B12 injections.        Depression    On wellbutrin.  Appears to be handling things well.  Follow.        Essential hypertension, benign    Blood pressure under good control.  Continue same medication regimen.  Follow pressures.  Follow metabolic panel.        Hypercholesterolemia    Low cholesterol diet and exercise.  Follow  Lipid panel.        Hypothyroidism    On thyroid replacement. Follow tsh.        Obesity (BMI 30-39.9)    Continue diet and exercise.  Follow.        Rotator cuff syndrome    S/p  surgery.  Released from PT.  Doing better.  Follow.         Other Visit Diagnoses   None.      Einar Pheasant, MD '

## 2016-01-28 ENCOUNTER — Encounter: Payer: Self-pay | Admitting: Internal Medicine

## 2016-01-28 NOTE — Assessment & Plan Note (Signed)
Continue B12 injections.   

## 2016-01-28 NOTE — Assessment & Plan Note (Signed)
On wellbutrin.  Appears to be handling things well.  Follow.   

## 2016-01-28 NOTE — Assessment & Plan Note (Signed)
On thyroid replacement.  Follow tsh.  

## 2016-01-28 NOTE — Assessment & Plan Note (Signed)
Recently had EGD.  Ulcers present. On protonix and carafate.  Planning for f/u EGD in 01/2016.  Unable to tolerate iron.  Last hgb improved.  Follow cbc.

## 2016-01-28 NOTE — Assessment & Plan Note (Signed)
Low cholesterol diet and exercise.  Follow  Lipid panel.   

## 2016-01-28 NOTE — Assessment & Plan Note (Signed)
Continue diet and exercise.  Follow.  

## 2016-01-28 NOTE — Assessment & Plan Note (Signed)
Blood pressure under good control.  Continue same medication regimen.  Follow pressures.  Follow metabolic panel.   

## 2016-01-28 NOTE — Assessment & Plan Note (Signed)
S/p surgery.  Released from PT.  Doing better.  Follow.

## 2016-02-05 ENCOUNTER — Encounter: Payer: Self-pay | Admitting: *Deleted

## 2016-02-06 ENCOUNTER — Ambulatory Visit: Payer: 59 | Admitting: Anesthesiology

## 2016-02-06 ENCOUNTER — Encounter: Payer: Self-pay | Admitting: *Deleted

## 2016-02-06 ENCOUNTER — Ambulatory Visit
Admission: RE | Admit: 2016-02-06 | Discharge: 2016-02-06 | Disposition: A | Discharge status: Home / Self Care | Payer: 59 | Source: Ambulatory Visit | Attending: Gastroenterology | Admitting: Gastroenterology

## 2016-02-06 ENCOUNTER — Encounter
Admission: RE | Disposition: A | Discharge status: Home / Self Care | Payer: Self-pay | Source: Ambulatory Visit | Attending: Gastroenterology

## 2016-02-06 DIAGNOSIS — K254 Chronic or unspecified gastric ulcer with hemorrhage: Secondary | ICD-10-CM | POA: Diagnosis present

## 2016-02-06 DIAGNOSIS — Z8719 Personal history of other diseases of the digestive system: Secondary | ICD-10-CM | POA: Insufficient documentation

## 2016-02-06 DIAGNOSIS — Z9884 Bariatric surgery status: Secondary | ICD-10-CM | POA: Diagnosis not present

## 2016-02-06 HISTORY — PX: ESOPHAGOGASTRODUODENOSCOPY (EGD) WITH PROPOFOL: SHX5813

## 2016-02-06 LAB — HM COLONOSCOPY

## 2016-02-06 SURGERY — ESOPHAGOGASTRODUODENOSCOPY (EGD) WITH PROPOFOL
Anesthesia: General

## 2016-02-06 MED ORDER — PROPOFOL 10 MG/ML IV BOLUS
INTRAVENOUS | Status: DC | PRN
  Administered 2016-02-06: 100 mg via INTRAVENOUS

## 2016-02-06 MED ORDER — MIDAZOLAM HCL 5 MG/5ML IJ SOLN
INTRAMUSCULAR | Status: DC | PRN
  Administered 2016-02-06: 1 mg via INTRAVENOUS

## 2016-02-06 MED ORDER — PROPOFOL 500 MG/50ML IV EMUL
INTRAVENOUS | Status: DC | PRN
  Administered 2016-02-06: 140 ug/kg/min via INTRAVENOUS

## 2016-02-06 MED ORDER — SODIUM CHLORIDE 0.9 % IV SOLN: INTRAVENOUS | Status: DC

## 2016-02-06 MED ORDER — LIDOCAINE 2% (20 MG/ML) 5 ML SYRINGE
INTRAMUSCULAR | Status: DC | PRN
  Administered 2016-02-06: 40 mg via INTRAVENOUS

## 2016-02-06 MED ORDER — SODIUM CHLORIDE 0.9 % IV SOLN
INTRAVENOUS | Status: DC
  Administered 2016-02-06: 1000 mL via INTRAVENOUS

## 2016-02-06 MED ORDER — FENTANYL CITRATE (PF) 100 MCG/2ML IJ SOLN
INTRAMUSCULAR | Status: DC | PRN
  Administered 2016-02-06: 50 ug via INTRAVENOUS

## 2016-02-06 MED ORDER — SODIUM CHLORIDE 0.9 % IV SOLN
2.0000 g | Freq: Once | INTRAVENOUS | Status: AC
  Administered 2016-02-06: 2 g via INTRAVENOUS
  Filled 2016-02-06: qty 2000

## 2016-02-06 NOTE — Transfer of Care (Signed)
Immediate Anesthesia Transfer of Care Note  Patient: Lori Le  Procedure(s) Performed: Procedure(s): ESOPHAGOGASTRODUODENOSCOPY (EGD) WITH PROPOFOL (N/A)  Patient Location: PACU and Endoscopy Unit  Anesthesia Type:General  Level of Consciousness: sedated  Airway & Oxygen Therapy: Patient Spontanous Breathing and Patient connected to nasal cannula oxygen  Post-op Assessment: Report given to RN and Post -op Vital signs reviewed and stable  Post vital signs: Reviewed and stable  Last Vitals:  Vitals:   02/06/16 0830  BP: (!) 123/58  Pulse: 69  Resp: 16  Temp: 36.3 C    Last Pain:  Vitals:   02/06/16 0830  TempSrc: Tympanic         Complications: No apparent anesthesia complications

## 2016-02-06 NOTE — H&P (Signed)
Outpatient short stay form Pre-procedure 02/06/2016 9:11 AM Lollie Sails MD  Primary Physician: Dr. Einar Pheasant  Reason for visit:  EGD  History of present illness:  Patient is a 50 year old female presenting today for repeat EGD. She had an EGD done on 12/05/2015 due to off the ground emesis and hematemesis as well as melena. At that time she was found to have 2 post anastomotic ulcers as well as a single gastric ulcer/anastomotic in the setting of her previous history of Roux-en-Y gastric bypass. She has done well since then. She has minimal epigastric symptoms. She's had no repeat of hematemesis or black stools. She continues on a single dose of Carafate a day as well as Protonix once daily.    Current Facility-Administered Medications:  .  0.9 %  sodium chloride infusion, , Intravenous, Continuous, Lollie Sails, MD, Last Rate: 20 mL/hr at 02/06/16 0843, 1,000 mL at 02/06/16 0843 .  0.9 %  sodium chloride infusion, , Intravenous, Continuous, Lollie Sails, MD .  ampicillin (OMNIPEN) 2 g in sodium chloride 0.9 % 50 mL IVPB, 2 g, Intravenous, Once, Lollie Sails, MD, 2 g at 02/06/16 [id number]  Prescriptions Prior to Admission  Medication Sig Dispense Refill Last Dose  . buPROPion (WELLBUTRIN XL) 300 MG 24 hr tablet Take 1 tablet (300 mg total) by mouth daily. 30 tablet 2 02/05/2016 at Unknown time  . furosemide (LASIX) 40 MG tablet TAKE 1 TABLET BY MOUTH EVERY DAY 90 tablet 3 02/05/2016 at Unknown time  . KLOR-CON 10 10 MEQ tablet TAKE ONE TABLET TWICE FOR THREE DAYS AND THEN DAILY 30 tablet 1 02/05/2016 at Unknown time  . levothyroxine (SYNTHROID, LEVOTHROID) 137 MCG tablet TAKE ONE TABLET BY MOUTH ONCE DAILY BEFORE BREAKFAST 90 tablet 0 02/05/2016 at Unknown time  . levothyroxine (SYNTHROID, LEVOTHROID) 137 MCG tablet TAKE ONE TABLET BY MOUTH ONCE DAILY BEFORE BREAKFAST 30 tablet 9 02/05/2016 at Unknown time  . omeprazole (PRILOSEC) 20 MG capsule Take 1 capsule (20 mg total) by  mouth daily. (Patient taking differently: Take 20 mg by mouth daily. This is OTC medication.) 90 capsule 1 02/05/2016 at Unknown time  . pantoprazole (PROTONIX) 40 MG tablet Take 40 mg by mouth 2 (two) times daily before a meal.   02/05/2016 at Unknown time  . triamcinolone (KENALOG) 0.025 % cream Apply 1 application topically 2 (two) times daily.   02/05/2016 at Unknown time  . triamterene-hydrochlorothiazide (MAXZIDE-25) 37.5-25 MG tablet TAKE 1 TABLET BY MOUTH EVERY DAY 90 tablet 1 02/05/2016 at Unknown time     Allergies  Allergen Reactions  . Influenza Vaccines Hives  . Lactose Nausea And Vomiting    Can have yogurt Can not have milk and ice cream   . Lactose Intolerance (Gi) Nausea And Vomiting    Can have yogurt Can not have milk and ice cream    . Bacitracin-Neomycin-Polymyxin Rash  . Neosporin + Pain Relief Max St [Neomy-Bacit-Polymyx-Pramoxine] Rash     Past Medical History:  Diagnosis Date  . Allergic rhinitis due to allergen   . Allergy   . Anemia   . Arthritis   . Chronic idiopathic urticaria   . Complication of anesthesia    nausea, slow to wake up  . GERD (gastroesophageal reflux disease)   . Gout   . Hashimoto's thyroiditis   . History of eating disorder   . Hives   . Hyperlipidemia   . Hypertension   . Hypothyroidism   . Lower extremity edema   .  Melanoma in situ ([place])    left shoulder  . Migraine   . Mitral valve disorder   . PCOS (polycystic ovarian syndrome)   . PONV (postoperative nausea and vomiting)     Review of systems:      Physical Exam    Heart and lungs: Regular rate and rhythm without rub or gallop, lungs are bilaterally clear.    HEENT: Normocephalic atraumatic eyes are anicteric    Other:     Pertinant exam for procedure: Soft nontender nondistended bowel sounds positive normoactive.    Planned proceedures: EGD and indicated procedures. Outpatient short stay form Pre-procedure 02/06/2016 9:13 AM Lollie Sails MD

## 2016-02-06 NOTE — Op Note (Signed)
[facility] Patient Name: Lori Le Procedure Date: 02/06/2016 9:13 AM MRN: [id number] Account #: [id number] Date of Birth: 06-23-1965 Admit Type: Outpatient Age: 50 Room: [facility] ENDO ROOM 3 Gender: Female Note Status: Finalized Procedure:            Upper GI endoscopy Indications:          Follow-up of gastric ulcer with hemorrhage, Follow-up                        of gastrojejunal ulcer with hemorrhage Providers:            Lollie Sails, MD Referring MD:         Einar Pheasant, MD (Referring MD) Medicines:            Monitored Anesthesia Care Complications:        No immediate complications. Procedure:            Pre-Anesthesia Assessment:                       - ASA Grade Assessment: III - A patient with severe                        systemic disease.                       After obtaining informed consent, the endoscope was                        passed under direct vision. Throughout the procedure,                        the patient's blood pressure, pulse, and oxygen                        saturations were monitored continuously. The Endoscope                        was introduced through the mouth, and advanced to the                        efferent jejunal loop. The upper GI endoscopy was                        accomplished without difficulty. The patient tolerated                        the procedure well. Findings:      The Z-line was variable and was found 40 cm from the incisors. Biopsies       were taken with a cold forceps for histology.      Evidence of a Roux-en-Y gastrojejunostomy was found. The gastrojejunal       anastomosis was characterized by healthy appearing mucosa. This was       traversed. The pouch-to-jejunum limb was characterized by healthy       appearing mucosa and a single erosion, haling with minimal mucosal       defect. The previously noted ulcers were healed. Tthere is the       appearance of a minimal  erythema on the tips of some of the gastric       folds and biopsies were  taken for histology. Biopsy also gtaken of the       anastomosis at a site of mucosal heaping, no other mucosal defect noted.       I was unable to retroflex, however peripheral examinatiton witht       multiple passes was normal. Impression:           - Z-line variable, 40 cm from the incisors. Biopsied.                       - Roux-en-Y gastrojejunostomy with gastrojejunal                        anastomosis characterized by healthy appearing mucosa.                        Healed anastomotiuc ulcer and post anastomosis ulcers. Recommendation:       - Continue present medications. Procedure Code(s):    --- Professional ---                       [phone], Esophagogastroduodenoscopy, flexible, transoral;                        with biopsy, single or multiple CPT copyright 2016 American Medical Association. All rights reserved. The codes documented in this report are preliminary and upon coder review may  be revised to meet current compliance requirements. Lollie Sails, MD 02/06/2016 9:41:11 AM This report has been signed electronically. Number of Addenda: 0 Note Initiated On: 02/06/2016 9:13 AM      [facility]

## 2016-02-06 NOTE — Anesthesia Postprocedure Evaluation (Signed)
Anesthesia Post Note  Patient: Lori Le  Procedure(s) Performed: Procedure(s) (LRB): ESOPHAGOGASTRODUODENOSCOPY (EGD) WITH PROPOFOL (N/A)  Patient location during evaluation: PACU Anesthesia Type: General Level of consciousness: awake Pain management: pain level controlled Respiratory status: spontaneous breathing Cardiovascular status: stable Anesthetic complications: no    Last Vitals:  Vitals:   02/06/16 0943 02/06/16 0953  BP: (!) 112/54 101/79  Pulse: 65   Resp: 18   Temp: 36.5 C     Last Pain:  Vitals:   02/06/16 0943  TempSrc: Tympanic                 VAN STAVEREN,Dhanvin Szeto

## 2016-02-06 NOTE — Anesthesia Preprocedure Evaluation (Signed)
Anesthesia Evaluation  Patient identified by MRN, date of birth, ID band Patient awake    History of Anesthesia Complications (+) PONV  Airway Mallampati: I       Dental  (+) Teeth Intact   Pulmonary neg pulmonary ROS,    breath sounds clear to auscultation       Cardiovascular Exercise Tolerance: Good hypertension, Pt. on medications  Rhythm:Regular Rate:Normal     Neuro/Psych  Headaches, Depression    GI/Hepatic Neg liver ROS, GERD  Medicated,  Endo/Other  Hypothyroidism   Renal/GU negative Renal ROS     Musculoskeletal   Abdominal Normal abdominal exam  (+)   Peds negative pediatric ROS (+)  Hematology  (+) anemia ,   Anesthesia Other Findings   Reproductive/Obstetrics                             Anesthesia Physical Anesthesia Plan  ASA: II  Anesthesia Plan: General   Post-op Pain Management:    Induction: Intravenous  Airway Management Planned: Natural Airway and Nasal Cannula  Additional Equipment:   Intra-op Plan:   Post-operative Plan:   Informed Consent: I have reviewed the patients History and Physical, chart, labs and discussed the procedure including the risks, benefits and alternatives for the proposed anesthesia with the patient or authorized representative who has indicated his/her understanding and acceptance.     Plan Discussed with: CRNA  Anesthesia Plan Comments:         Anesthesia Quick Evaluation

## 2016-02-07 ENCOUNTER — Encounter: Payer: Self-pay | Admitting: Gastroenterology

## 2016-02-07 LAB — SURGICAL PATHOLOGY

## 2016-02-16 ENCOUNTER — Other Ambulatory Visit (INDEPENDENT_AMBULATORY_CARE_PROVIDER_SITE_OTHER): Payer: 59

## 2016-02-16 DIAGNOSIS — D649 Anemia, unspecified: Secondary | ICD-10-CM

## 2016-02-16 LAB — CBC WITH DIFFERENTIAL/PLATELET
BASOS PCT: 1 %
Basophils Absolute: 70 cells/uL (ref 0–200)
EOS ABS: 70 {cells}/uL (ref 15–500)
Eosinophils Relative: 1 %
HEMATOCRIT: 30.4 % — AB (ref 35.0–45.0)
Hemoglobin: 9.1 g/dL — ABNORMAL LOW (ref 11.7–15.5)
LYMPHS PCT: 24 %
Lymphs Abs: 1680 cells/uL (ref 850–3900)
MCH: 22.4 pg — ABNORMAL LOW (ref 27.0–33.0)
MCHC: 29.9 g/dL — ABNORMAL LOW (ref 32.0–36.0)
MCV: 74.9 fL — ABNORMAL LOW (ref 80.0–100.0)
MONO ABS: 560 {cells}/uL (ref 200–950)
MPV: 10.7 fL (ref 7.5–12.5)
Monocytes Relative: 8 %
NEUTROS ABS: 4620 {cells}/uL (ref 1500–7800)
Neutrophils Relative %: 66 %
PLATELETS: 389 10*3/uL (ref 140–400)
RBC: 4.06 MIL/uL (ref 3.80–5.10)
RDW: 14.8 % (ref 11.0–15.0)
WBC: 7 10*3/uL (ref 3.8–10.8)

## 2016-02-16 LAB — FERRITIN: Ferritin: 5 ng/mL — ABNORMAL LOW (ref 10–232)

## 2016-02-16 NOTE — Addendum Note (Signed)
Addended by: Frutoso Chase A on: 02/16/2016 03:41 PM   Modules accepted: Orders

## 2016-02-18 ENCOUNTER — Encounter: Payer: Self-pay | Admitting: Internal Medicine

## 2016-02-18 DIAGNOSIS — D649 Anemia, unspecified: Secondary | ICD-10-CM

## 2016-02-19 NOTE — Telephone Encounter (Signed)
Order placed for hematology referral.  

## 2016-02-23 ENCOUNTER — Other Ambulatory Visit: Payer: Self-pay | Admitting: Internal Medicine

## 2016-02-27 ENCOUNTER — Encounter: Payer: Self-pay | Admitting: Internal Medicine

## 2016-03-08 ENCOUNTER — Encounter: Payer: Self-pay | Admitting: Hematology and Oncology

## 2016-03-08 ENCOUNTER — Inpatient Hospital Stay: Payer: 59 | Attending: Hematology and Oncology | Admitting: Hematology and Oncology

## 2016-03-08 ENCOUNTER — Other Ambulatory Visit: Payer: Self-pay | Admitting: Hematology and Oncology

## 2016-03-08 ENCOUNTER — Ambulatory Visit: Payer: Self-pay

## 2016-03-08 VITALS — BP 127/76 | HR 81 | Temp 98.9°F | Resp 18 | Ht 67.5 in | Wt 211.0 lb

## 2016-03-08 DIAGNOSIS — Z803 Family history of malignant neoplasm of breast: Secondary | ICD-10-CM

## 2016-03-08 DIAGNOSIS — R011 Cardiac murmur, unspecified: Secondary | ICD-10-CM

## 2016-03-08 DIAGNOSIS — K219 Gastro-esophageal reflux disease without esophagitis: Secondary | ICD-10-CM | POA: Diagnosis not present

## 2016-03-08 DIAGNOSIS — Z79899 Other long term (current) drug therapy: Secondary | ICD-10-CM

## 2016-03-08 DIAGNOSIS — Z9884 Bariatric surgery status: Secondary | ICD-10-CM

## 2016-03-08 DIAGNOSIS — Z85828 Personal history of other malignant neoplasm of skin: Secondary | ICD-10-CM | POA: Diagnosis not present

## 2016-03-08 DIAGNOSIS — M109 Gout, unspecified: Secondary | ICD-10-CM

## 2016-03-08 DIAGNOSIS — E785 Hyperlipidemia, unspecified: Secondary | ICD-10-CM | POA: Diagnosis not present

## 2016-03-08 DIAGNOSIS — Z8711 Personal history of peptic ulcer disease: Secondary | ICD-10-CM

## 2016-03-08 DIAGNOSIS — E039 Hypothyroidism, unspecified: Secondary | ICD-10-CM

## 2016-03-08 DIAGNOSIS — E063 Autoimmune thyroiditis: Secondary | ICD-10-CM | POA: Diagnosis not present

## 2016-03-08 DIAGNOSIS — F329 Major depressive disorder, single episode, unspecified: Secondary | ICD-10-CM | POA: Diagnosis not present

## 2016-03-08 DIAGNOSIS — N920 Excessive and frequent menstruation with regular cycle: Secondary | ICD-10-CM

## 2016-03-08 DIAGNOSIS — M129 Arthropathy, unspecified: Secondary | ICD-10-CM | POA: Diagnosis not present

## 2016-03-08 DIAGNOSIS — R609 Edema, unspecified: Secondary | ICD-10-CM

## 2016-03-08 DIAGNOSIS — I10 Essential (primary) hypertension: Secondary | ICD-10-CM

## 2016-03-08 DIAGNOSIS — R5383 Other fatigue: Secondary | ICD-10-CM | POA: Diagnosis not present

## 2016-03-08 DIAGNOSIS — D509 Iron deficiency anemia, unspecified: Secondary | ICD-10-CM

## 2016-03-08 DIAGNOSIS — D500 Iron deficiency anemia secondary to blood loss (chronic): Secondary | ICD-10-CM

## 2016-03-08 DIAGNOSIS — E538 Deficiency of other specified B group vitamins: Secondary | ICD-10-CM

## 2016-03-08 LAB — FERRITIN: Ferritin: 5 ng/mL — ABNORMAL LOW (ref 11–307)

## 2016-03-08 LAB — CBC WITH DIFFERENTIAL/PLATELET
Basophils Absolute: 0.1 K/uL (ref 0–0.1)
Basophils Relative: 2 %
Eosinophils Absolute: 0.1 K/uL (ref 0–0.7)
Eosinophils Relative: 1 %
HCT: 30.1 % — ABNORMAL LOW (ref 35.0–47.0)
Hemoglobin: 9.1 g/dL — ABNORMAL LOW (ref 12.0–16.0)
Lymphocytes Relative: 18 %
Lymphs Abs: 1.5 K/uL (ref 1.0–3.6)
MCH: 21.5 pg — ABNORMAL LOW (ref 26.0–34.0)
MCHC: 30.4 g/dL — ABNORMAL LOW (ref 32.0–36.0)
MCV: 70.7 fL — ABNORMAL LOW (ref 80.0–100.0)
Monocytes Absolute: 0.7 K/uL (ref 0.2–0.9)
Monocytes Relative: 8 %
Neutro Abs: 5.8 K/uL (ref 1.4–6.5)
Neutrophils Relative %: 71 %
Platelets: 331 K/uL (ref 150–440)
RBC: 4.26 MIL/uL (ref 3.80–5.20)
RDW: 15.8 % — ABNORMAL HIGH (ref 11.5–14.5)
WBC: 8.2 K/uL (ref 3.6–11.0)

## 2016-03-08 LAB — PROTIME-INR
INR: 0.99
Prothrombin Time: 13.1 seconds (ref 11.4–15.2)

## 2016-03-08 LAB — PLATELET FUNCTION ASSAY: Collagen / Epinephrine: 109 seconds (ref 0–193)

## 2016-03-08 LAB — APTT: aPTT: 28 seconds (ref 24–36)

## 2016-03-08 NOTE — Progress Notes (Signed)
Patient here today as new evaluation regarding anemia.  Referred by Dr. Einar Pheasant.

## 2016-03-08 NOTE — Progress Notes (Addendum)
[facility] day:  03/08/2016  Chief Complaint: Lori Le is a 50 y.o. female with a history of gastric bypass surgery who is referred in consultation by Dr. Einar Pheasant for anemia.  HPI:  She underwent gastric bypass surgery in 2011.  She states that she eats meat almost daily.  She eats chicken several times/week.  She denies any ice pica.  She does not tolerate oral iron secondary to severe constipation.  She uses Miralax and Colace.  Oral iron has typically not helped increase her iron stores.  She notes a history of a bleeding ulcer in 10/2015.  She states that she had hematemesis x 1 and coffee ground emesis x 3.  Hemoglobin decreased from 13.6 to 9.6.  EGD on 12/05/2015 by Dr. Gustavo Lah revealed edema and erythema of the gastrojejunal anastomosis.  She was treated with Carafate and Protonix.  Repeat EGD on 02/06/2016 revealed minimal erythema on the tips of some gastric folds.  She describes very heavy menses, lasting 7-10 days.  Some days she uses 2 tampons at a time (super plus).  She bruises easily.  She denies any aspirin or ibuprofen use.  She denies any excess bleeding with surgeries.  She denies any family history of excess bruising or bleeding.  Labs on 02/18/2016 revealed a hematocrit of 30.4, hemoglobin 9.1, MCV 74.9, platelets 389,000, WBC 7000 with an ANC of 4620.  Ferritin was 5.0.  Symptomatically, her energy level has been low for several weeks.  She has to sit and rest after walking up steps.  She has never had a colonoscopy.  Her maternal and paternal grandmother had breast cancer.  She is unaware of her last mammogram.   Past Medical History:  Diagnosis Date  . Allergic rhinitis due to allergen   . Allergy   . Anemia   . Arthritis   . Chronic idiopathic urticaria   . Complication of anesthesia    nausea, slow to wake up  . Depression   . GERD (gastroesophageal reflux disease)   . Gout   . Gout   . Hashimoto's  thyroiditis   . Heart murmur   . History of eating disorder   . Hives   . Hyperlipidemia   . Hypertension   . Hypothyroidism   . Lower extremity edema   . Melanoma in situ ([place])    left shoulder  . Migraine   . Mitral valve disorder   . PCOS (polycystic ovarian syndrome)   . PONV (postoperative nausea and vomiting)   . Thyroid disease   . Ulcer [facility])     Past Surgical History:  Procedure Laterality Date  . CHOLECYSTECTOMY  2003  . ESOPHAGOGASTRODUODENOSCOPY (EGD) WITH PROPOFOL N/A 12/05/2015   Procedure: ESOPHAGOGASTRODUODENOSCOPY (EGD) WITH PROPOFOL;  Surgeon: Lollie Sails, MD;  Location: [facility] ENDOSCOPY;  Service: Endoscopy;  Laterality: N/A;  . ESOPHAGOGASTRODUODENOSCOPY (EGD) WITH PROPOFOL N/A 02/06/2016   Procedure: ESOPHAGOGASTRODUODENOSCOPY (EGD) WITH PROPOFOL;  Surgeon: Lollie Sails, MD;  Location: [facility] ENDOSCOPY;  Service: Endoscopy;  Laterality: N/A;  . FOOT SURGERY  1994  . GASTRIC BYPASS  2011  . JOINT REPLACEMENT Left 09/27/2014   HIP  . JOINT REPLACEMENT Left 12/01/2014   KNEE  . KNEE ARTHROSCOPY Right   . KNEE ARTHROSCOPY Left 03/23/2015   Procedure: ARTHROSCOPY KNEE, partial synovectomy;  Surgeon: Hessie Knows, MD;  Location: [facility] ORS;  Service: Orthopedics;  Laterality: Left;  . LAPAROSCOPIC GASTRIC BANDING  2010  . SHOULDER ARTHROSCOPY Left  06/20/2015   Procedure: ARTHROSCOPY SHOULDER, REPAIR OF MASSIVE ROTATOR CUFF TEAR, TENODESIS, DECOMPRESSION DEBRIDEMENT;  Surgeon: Corky Mull, MD;  Location: [facility] ORS;  Service: Orthopedics;  Laterality: Left;  . TOTAL HIP ARTHROPLASTY Left 09/27/2014   Procedure: TOTAL HIP ARTHROPLASTY ANTERIOR APPROACH;  Surgeon: Hessie Knows, MD;  Location: [facility] ORS;  Service: Orthopedics;  Laterality: Left;  . TOTAL KNEE ARTHROPLASTY Left 12/01/2014   Procedure: TOTAL KNEE ARTHROPLASTY;  Surgeon: Hessie Knows, MD;  Location: [facility] ORS;  Service: Orthopedics;  Laterality: Left;    Family History  Problem Relation Age of Onset  .  Arthritis Mother   . Hyperlipidemia Mother   . Hypertension Mother   . Diabetes Mother   . Arthritis Father   . Hyperlipidemia Father   . Hypertension Father   . Mental illness Father   . Diabetes Father   . Arthritis Maternal Grandmother   . Cancer Maternal Grandmother     breast cancer  . Hyperlipidemia Maternal Grandmother   . Hypertension Maternal Grandmother   . Arthritis Maternal Grandfather   . Hyperlipidemia Maternal Grandfather   . Hypertension Maternal Grandfather   . Heart disease Maternal Grandfather     heart attack  . Breast cancer      maternal and paternal grandmother    Social History:  reports that she has never smoked. She has never used smokeless tobacco. She reports that she drinks alcohol. She reports that she does not use drugs.  The patient lives in [place].  The patient is alone today.  Allergies:  Allergies  Allergen Reactions  . Influenza Vaccines Hives  . Lactose Nausea And Vomiting    Can have yogurt Can not have milk and ice cream   . Lactose Intolerance (Gi) Nausea And Vomiting    Can have yogurt Can not have milk and ice cream    . Bacitracin-Neomycin-Polymyxin Rash  . Neosporin + Pain Relief Max St [Neomy-Bacit-Polymyx-Pramoxine] Rash    Current Medications: Current Outpatient Prescriptions  Medication Sig Dispense Refill  . buPROPion (WELLBUTRIN XL) 300 MG 24 hr tablet TAKE 1 TABLET (300 MG TOTAL) BY MOUTH DAILY. 30 tablet 2  . levothyroxine (SYNTHROID, LEVOTHROID) 137 MCG tablet TAKE ONE TABLET BY MOUTH ONCE DAILY BEFORE BREAKFAST 90 tablet 0  . pantoprazole (PROTONIX) 40 MG tablet Take 40 mg by mouth 2 (two) times daily before a meal.    . furosemide (LASIX) 40 MG tablet Take 1 tablet (40 mg total) by mouth daily. 90 tablet 1  . KLOR-CON 10 10 MEQ tablet TAKE ONE TABLET TWICE FOR THREE DAYS AND THEN DAILY 30 tablet 2  . sucralfate (CARAFATE) 1 g tablet     . triamcinolone cream (KENALOG) 0.1 % Apply 1 application topically 2 (two)  times daily. 30 g 0  . triamterene-hydrochlorothiazide (MAXZIDE-25) 37.5-25 MG tablet Take 1 tablet by mouth daily. 90 tablet 1   No current facility-administered medications for this visit.     Review of Systems:  GENERAL:  Energy level low for several weeks.  No fevers, sweats or weight loss. PERFORMANCE STATUS (ECOG):  1 HEENT:  Runny nose.  Sinus infection 2 weeks ago.  No visual changes, sore throat, mouth sores or tenderness. Lungs: Shortness of breath with exertion.  No cough.  No hemoptysis. Cardiac:  No chest pain, palpitations, orthopnea, or PND. GI:  Constipation (see HPI).  No nausea, vomiting, diarrhea, melena or hematochezia. GU:  Heavy menses (see HPI).  No urgency, frequency, dysuria, or hematuria. Musculoskeletal:  No back  pain.  Oddly shaped joints.  .  No muscle tenderness. Extremities:  No pain or swelling. Skin:  Bruises easily.  No rashes or skin changes. Neuro:  Sinus headache.  No numbness or weakness, balance or coordination issues. Endocrine:  No diabetes.  Thyroid disease on Synthroid.  No hot flashes or night sweats. Psych:  No mood changes, depression or anxiety. Pain:  No focal pain. Review of systems:  All other systems reviewed and found to be negative.  Physical Exam: Blood pressure 127/76, pulse 81, temperature 98.9 F (37.2 C), temperature source Tympanic, resp. rate 18, height 5' 7.5" (1.715 m), weight 210 lb 15.7 oz (95.7 kg). GENERAL:  Well developed, well nourished, woman sitting comfortably in the exam room in no acute distress. MENTAL STATUS:  Alert and oriented to person, place and time. HEAD:  Short brown hair.  Normocephalic, atraumatic, face symmetric, no Cushingoid features. EYES:  Blue eyes.  Pupils equal round and reactive to light and accomodation.  No conjunctivitis or scleral icterus. ENT:  Oropharynx clear without lesion.  Tongue normal. Mucous membranes moist.  RESPIRATORY:  Clear to auscultation without rales, wheezes or  rhonchi. CARDIOVASCULAR:  Regular rate and rhythm without murmur, rub or gallop. ABDOMEN:  Soft, non-tender, with active bowel sounds, and no appreciable hepatosplenomegaly.  No masses. SKIN:  No rashes, ulcers or lesions. EXTREMITIES: No edema, no skin discoloration or tenderness.  No palpable cords. LYMPH NODES: No palpable cervical, supraclavicular, axillary or inguinal adenopathy  NEUROLOGICAL: Unremarkable. PSYCH:  Appropriate.   Appointment on 03/08/2016  Component Date Value Ref Range Status  . WBC 03/08/2016 8.2  3.6 - 11.0 K/uL Final  . RBC 03/08/2016 4.26  3.80 - 5.20 MIL/uL Final  . Hemoglobin 03/08/2016 9.1* 12.0 - 16.0 g/dL Final  . HCT 03/08/2016 30.1* 35.0 - 47.0 % Final  . MCV 03/08/2016 70.7* 80.0 - 100.0 fL Final  . MCH 03/08/2016 21.5* 26.0 - 34.0 pg Final  . MCHC 03/08/2016 30.4* 32.0 - 36.0 g/dL Final  . RDW 03/08/2016 15.8* 11.5 - 14.5 % Final  . Platelets 03/08/2016 331  150 - 440 K/uL Final  . Neutrophils Relative % 03/08/2016 71  % Final  . Neutro Abs 03/08/2016 5.8  1.4 - 6.5 K/uL Final  . Lymphocytes Relative 03/08/2016 18  % Final  . Lymphs Abs 03/08/2016 1.5  1.0 - 3.6 K/uL Final  . Monocytes Relative 03/08/2016 8  % Final  . Monocytes Absolute 03/08/2016 0.7  0.2 - 0.9 K/uL Final  . Eosinophils Relative 03/08/2016 1  % Final  . Eosinophils Absolute 03/08/2016 0.1  0 - 0.7 K/uL Final  . Basophils Relative 03/08/2016 2  % Final  . Basophils Absolute 03/08/2016 0.1  0 - 0.1 K/uL Final  . Ferritin 03/08/2016 5* 11 - 307 ng/mL Final  . aPTT 03/08/2016 28  24 - 36 seconds Final  . Prothrombin Time 03/08/2016 13.1  11.4 - 15.2 seconds Final  . INR 03/08/2016 0.99   Final  . PFA Interpretation 03/08/2016          Final   Comment: Platelet function is normal. If patient history/physical examination give strong indication of a bleeding disorder repeat testing for confirmation.        Results of the test should always be interpreted in conjunction with  the patient's medical history, clinical presentation and medication history. Patients with Hematocrit values <35.0% or Platelet counts <150,000/uL may result in values above the Laboratory established reference range.   . Collagen /  Epinephrine 03/08/2016 109  0 - 193 seconds Final    Assessment:  Lori Le is a 50 y.o. female s/p gastric bypass surgery (2011) with iron deficiency anemia. She has a history of bleeding ulcer in 10/2015.  She has heavy menses.  She bruises easily.  EGD on 12/05/2015 revealed edema and erythema of the gastrojejunal anastomosis. She was treated with Carafate and Protonix.  Repeat EGD on 02/06/2016 revealed minimal erythema on the tips of some gastric folds.  She has never had a colonoscopy.  Diet is good.  She denies any ice pica.  She is intolerant of oral iron secondary to severe constipation.   Labs on 02/18/2016 revealed a hematocrit of 30.4, hemoglobin 9.1, MCV 74.9, platelets 389,000, WBC 7000 with an ANC of 4620.  Ferritin was 5.0.  She has received B12.  She has a family history of breast cancer.  She is unaware of her last mammogram.  Symptomatically, her energy level has been low for several weeks.  She has to sit and rest after walking up steps.  Exam is unremarkable.  Plan: 1.  Discuss diagnosis of iron deficiency anemia.  She can not absorb oral iron well secondary to gastric bypass surgery.  She has a history of ulcer.  She has chronic blood loss secondary to heavy menses.  Discuss checking von Willebrand panel with next heavy menses.  Discuss oral versus IV iron.  Discuss potential reaction to IV iron. 2.  Labs today: CBC with diff, ferritin, PT, PTT, platelet function assay. 3.  Patient to call with heavy menses for work-up (von Willebrand panel). 4.  Preauth Venofer. 5.  RTC weekly x 4 for Venofer.  Goal ferritin is 100. 6.  RTC in 6 weeks for MD assessment, labs (CBC with diff, ferritin, iron studies- day before) and +/-  Venofer   Lequita Asal, MD  03/08/2016

## 2016-03-15 ENCOUNTER — Inpatient Hospital Stay: Payer: 59

## 2016-03-15 VITALS — BP 108/71 | HR 72 | Temp 97.3°F | Resp 20

## 2016-03-15 DIAGNOSIS — D500 Iron deficiency anemia secondary to blood loss (chronic): Secondary | ICD-10-CM | POA: Diagnosis not present

## 2016-03-15 MED ORDER — SODIUM CHLORIDE 0.9 % IV SOLN
Freq: Once | INTRAVENOUS | Status: AC
  Administered 2016-03-15: 14:00:00 via INTRAVENOUS
  Filled 2016-03-15: qty 1000

## 2016-03-15 MED ORDER — SODIUM CHLORIDE 0.9 % IV SOLN: 200.0000 mg | Freq: Once | INTRAVENOUS | Status: DC

## 2016-03-15 MED ORDER — IRON SUCROSE 20 MG/ML IV SOLN
200.0000 mg | Freq: Once | INTRAVENOUS | Status: AC
  Administered 2016-03-15: 200 mg via INTRAVENOUS
  Filled 2016-03-15: qty 10

## 2016-03-21 ENCOUNTER — Other Ambulatory Visit: Payer: Self-pay | Admitting: Internal Medicine

## 2016-03-22 ENCOUNTER — Inpatient Hospital Stay: Payer: 59

## 2016-03-22 VITALS — BP 127/76 | HR 89 | Temp 97.4°F | Resp 20

## 2016-03-22 DIAGNOSIS — D500 Iron deficiency anemia secondary to blood loss (chronic): Secondary | ICD-10-CM

## 2016-03-22 MED ORDER — SODIUM CHLORIDE 0.9 % IV SOLN
Freq: Once | INTRAVENOUS | Status: AC
  Administered 2016-03-22: 10:00:00 via INTRAVENOUS
  Filled 2016-03-22: qty 1000

## 2016-03-22 MED ORDER — IRON SUCROSE 20 MG/ML IV SOLN
200.0000 mg | Freq: Once | INTRAVENOUS | Status: AC
  Administered 2016-03-22: 200 mg via INTRAVENOUS
  Filled 2016-03-22: qty 10

## 2016-03-29 ENCOUNTER — Inpatient Hospital Stay: Payer: 59

## 2016-03-29 VITALS — BP 126/83 | HR 81 | Temp 96.1°F | Resp 18

## 2016-03-29 DIAGNOSIS — D500 Iron deficiency anemia secondary to blood loss (chronic): Secondary | ICD-10-CM | POA: Diagnosis not present

## 2016-03-29 MED ORDER — SODIUM CHLORIDE 0.9 % IV SOLN: 200.0000 mg | Freq: Once | INTRAVENOUS | Status: DC

## 2016-03-29 MED ORDER — IRON SUCROSE 20 MG/ML IV SOLN
200.0000 mg | Freq: Once | INTRAVENOUS | Status: AC
  Administered 2016-03-29: 200 mg via INTRAVENOUS
  Filled 2016-03-29: qty 10

## 2016-03-29 MED ORDER — SODIUM CHLORIDE 0.9 % IV SOLN
Freq: Once | INTRAVENOUS | Status: AC
  Administered 2016-03-29: 14:00:00 via INTRAVENOUS
  Filled 2016-03-29: qty 1000

## 2016-04-05 ENCOUNTER — Encounter: Payer: Self-pay | Admitting: Internal Medicine

## 2016-04-05 ENCOUNTER — Inpatient Hospital Stay: Payer: 59 | Attending: Hematology and Oncology

## 2016-04-05 ENCOUNTER — Ambulatory Visit (INDEPENDENT_AMBULATORY_CARE_PROVIDER_SITE_OTHER): Payer: 59 | Admitting: Internal Medicine

## 2016-04-05 VITALS — BP 111/69 | HR 78 | Temp 97.8°F | Resp 19

## 2016-04-05 DIAGNOSIS — M7989 Other specified soft tissue disorders: Secondary | ICD-10-CM | POA: Insufficient documentation

## 2016-04-05 DIAGNOSIS — I10 Essential (primary) hypertension: Secondary | ICD-10-CM | POA: Diagnosis not present

## 2016-04-05 DIAGNOSIS — Z760 Encounter for issue of repeat prescription: Secondary | ICD-10-CM | POA: Diagnosis not present

## 2016-04-05 DIAGNOSIS — Z85828 Personal history of other malignant neoplasm of skin: Secondary | ICD-10-CM | POA: Diagnosis not present

## 2016-04-05 DIAGNOSIS — K219 Gastro-esophageal reflux disease without esophagitis: Secondary | ICD-10-CM | POA: Diagnosis not present

## 2016-04-05 DIAGNOSIS — E282 Polycystic ovarian syndrome: Secondary | ICD-10-CM

## 2016-04-05 DIAGNOSIS — F32A Depression, unspecified: Secondary | ICD-10-CM

## 2016-04-05 DIAGNOSIS — Z9884 Bariatric surgery status: Secondary | ICD-10-CM

## 2016-04-05 DIAGNOSIS — D500 Iron deficiency anemia secondary to blood loss (chronic): Secondary | ICD-10-CM

## 2016-04-05 DIAGNOSIS — E039 Hypothyroidism, unspecified: Secondary | ICD-10-CM | POA: Diagnosis not present

## 2016-04-05 DIAGNOSIS — Z8711 Personal history of peptic ulcer disease: Secondary | ICD-10-CM | POA: Diagnosis not present

## 2016-04-05 DIAGNOSIS — R609 Edema, unspecified: Secondary | ICD-10-CM

## 2016-04-05 DIAGNOSIS — E785 Hyperlipidemia, unspecified: Secondary | ICD-10-CM | POA: Diagnosis not present

## 2016-04-05 DIAGNOSIS — F329 Major depressive disorder, single episode, unspecified: Secondary | ICD-10-CM | POA: Insufficient documentation

## 2016-04-05 DIAGNOSIS — R011 Cardiac murmur, unspecified: Secondary | ICD-10-CM | POA: Diagnosis not present

## 2016-04-05 DIAGNOSIS — N920 Excessive and frequent menstruation with regular cycle: Secondary | ICD-10-CM | POA: Insufficient documentation

## 2016-04-05 DIAGNOSIS — Z803 Family history of malignant neoplasm of breast: Secondary | ICD-10-CM | POA: Insufficient documentation

## 2016-04-05 DIAGNOSIS — E063 Autoimmune thyroiditis: Secondary | ICD-10-CM | POA: Insufficient documentation

## 2016-04-05 DIAGNOSIS — E669 Obesity, unspecified: Secondary | ICD-10-CM

## 2016-04-05 DIAGNOSIS — M109 Gout, unspecified: Secondary | ICD-10-CM | POA: Diagnosis not present

## 2016-04-05 DIAGNOSIS — Z79899 Other long term (current) drug therapy: Secondary | ICD-10-CM | POA: Insufficient documentation

## 2016-04-05 DIAGNOSIS — G43809 Other migraine, not intractable, without status migrainosus: Secondary | ICD-10-CM | POA: Diagnosis not present

## 2016-04-05 DIAGNOSIS — M129 Arthropathy, unspecified: Secondary | ICD-10-CM | POA: Insufficient documentation

## 2016-04-05 DIAGNOSIS — Z9889 Other specified postprocedural states: Secondary | ICD-10-CM

## 2016-04-05 DIAGNOSIS — E7800 Pure hypercholesterolemia, unspecified: Secondary | ICD-10-CM

## 2016-04-05 DIAGNOSIS — Z9049 Acquired absence of other specified parts of digestive tract: Secondary | ICD-10-CM | POA: Insufficient documentation

## 2016-04-05 DIAGNOSIS — D039 Melanoma in situ, unspecified: Secondary | ICD-10-CM

## 2016-04-05 MED ORDER — TRIAMCINOLONE ACETONIDE 0.1 % EX CREA: 1.0000 "application " | TOPICAL_CREAM | Freq: Two times a day (BID) | CUTANEOUS | 0 refills | Status: DC

## 2016-04-05 MED ORDER — SODIUM CHLORIDE 0.9 % IV SOLN: 200.0000 mg | Freq: Once | INTRAVENOUS | Status: DC

## 2016-04-05 MED ORDER — FUROSEMIDE 40 MG PO TABS: 40.0000 mg | ORAL_TABLET | Freq: Every day | ORAL | 1 refills | Status: DC

## 2016-04-05 MED ORDER — IRON SUCROSE 20 MG/ML IV SOLN
200.0000 mg | Freq: Once | INTRAVENOUS | Status: AC
  Administered 2016-04-05: 200 mg via INTRAVENOUS
  Filled 2016-04-05: qty 10

## 2016-04-05 MED ORDER — TRIAMTERENE-HCTZ 37.5-25 MG PO TABS: 1.0000 | ORAL_TABLET | Freq: Every day | ORAL | 1 refills | Status: DC

## 2016-04-05 MED ORDER — SODIUM CHLORIDE 0.9 % IV SOLN
Freq: Once | INTRAVENOUS | Status: AC
  Administered 2016-04-05: 14:00:00 via INTRAVENOUS
  Filled 2016-04-05: qty 1000

## 2016-04-05 NOTE — Progress Notes (Signed)
Patient ID: Lori Le, female   DOB: [birth year], 51 y.o.   MRN: [id number]   Subjective:    Patient ID: Lori Le, female    DOB: 1965/12/13, 51 y.o.   MRN: [id number]  HPI  Patient here for a scheduled follow up.  She is doing better.  Feels better.  Followed by hematology.  Receiving iron infusions.  Due f/u cbc 04/18/16.  Had EGD 02/06/16.  z line variable.  Healed ulcers.  Currently doing well on current medications.  No acid reflux.  No abdominal pain or cramping.  Bowels stable.  Stress is better.  Her relationship stress is better.  She has moved out.     Past Medical History:  Diagnosis Date  . Allergic rhinitis due to allergen   . Allergy   . Anemia   . Arthritis   . Chronic idiopathic urticaria   . Complication of anesthesia    nausea, slow to wake up  . Depression   . GERD (gastroesophageal reflux disease)   . Gout   . Gout   . Hashimoto's thyroiditis   . Heart murmur   . History of eating disorder   . Hives   . Hyperlipidemia   . Hypertension   . Hypothyroidism   . Lower extremity edema   . Melanoma in situ ([place])    left shoulder  . Migraine   . Mitral valve disorder   . PCOS (polycystic ovarian syndrome)   . PONV (postoperative nausea and vomiting)   . Thyroid disease   . Ulcer [facility])    Past Surgical History:  Procedure Laterality Date  . CHOLECYSTECTOMY  2003  . ESOPHAGOGASTRODUODENOSCOPY (EGD) WITH PROPOFOL N/A 12/05/2015   Procedure: ESOPHAGOGASTRODUODENOSCOPY (EGD) WITH PROPOFOL;  Surgeon: Lollie Sails, MD;  Location: [facility] ENDOSCOPY;  Service: Endoscopy;  Laterality: N/A;  . ESOPHAGOGASTRODUODENOSCOPY (EGD) WITH PROPOFOL N/A 02/06/2016   Procedure: ESOPHAGOGASTRODUODENOSCOPY (EGD) WITH PROPOFOL;  Surgeon: Lollie Sails, MD;  Location: [facility] ENDOSCOPY;  Service: Endoscopy;  Laterality: N/A;  . FOOT SURGERY  1994  . GASTRIC BYPASS  2011  . JOINT REPLACEMENT Left 09/27/2014   HIP  . JOINT REPLACEMENT Left 12/01/2014   KNEE  . KNEE  ARTHROSCOPY Right   . KNEE ARTHROSCOPY Left 03/23/2015   Procedure: ARTHROSCOPY KNEE, partial synovectomy;  Surgeon: Hessie Knows, MD;  Location: [facility] ORS;  Service: Orthopedics;  Laterality: Left;  . LAPAROSCOPIC GASTRIC BANDING  2010  . SHOULDER ARTHROSCOPY Left 06/20/2015   Procedure: ARTHROSCOPY SHOULDER, REPAIR OF MASSIVE ROTATOR CUFF TEAR, TENODESIS, DECOMPRESSION DEBRIDEMENT;  Surgeon: Corky Mull, MD;  Location: [facility] ORS;  Service: Orthopedics;  Laterality: Left;  . TOTAL HIP ARTHROPLASTY Left 09/27/2014   Procedure: TOTAL HIP ARTHROPLASTY ANTERIOR APPROACH;  Surgeon: Hessie Knows, MD;  Location: [facility] ORS;  Service: Orthopedics;  Laterality: Left;  . TOTAL KNEE ARTHROPLASTY Left 12/01/2014   Procedure: TOTAL KNEE ARTHROPLASTY;  Surgeon: Hessie Knows, MD;  Location: [facility] ORS;  Service: Orthopedics;  Laterality: Left;   Family History  Problem Relation Age of Onset  . Arthritis Mother   . Hyperlipidemia Mother   . Hypertension Mother   . Diabetes Mother   . Arthritis Father   . Hyperlipidemia Father   . Hypertension Father   . Mental illness Father   . Diabetes Father   . Arthritis Maternal Grandmother   . Cancer Maternal Grandmother     breast cancer  . Hyperlipidemia Maternal Grandmother   . Hypertension Maternal Grandmother   . Arthritis Maternal  Grandfather   . Hyperlipidemia Maternal Grandfather   . Hypertension Maternal Grandfather   . Heart disease Maternal Grandfather     heart attack  . Breast cancer      maternal and paternal grandmother   Social History   Social History  . Marital status: Married    Spouse name: N/A  . Number of children: N/A  . Years of education: N/A   Social History Main Topics  . Smoking status: Never Smoker  . Smokeless tobacco: Never Used  . Alcohol use 0.0 - 0.6 oz/week     Comment: occ.  . Drug use: No  . Sexual activity: No     Comment: [place]   Other Topics Concern  . None   Social History Narrative  . None     Outpatient Encounter Prescriptions as of 04/05/2016  Medication Sig  . buPROPion (WELLBUTRIN XL) 300 MG 24 hr tablet TAKE 1 TABLET (300 MG TOTAL) BY MOUTH DAILY.  . furosemide (LASIX) 40 MG tablet Take 1 tablet (40 mg total) by mouth daily.  [name] KLOR-CON 10 10 MEQ tablet TAKE ONE TABLET TWICE FOR THREE DAYS AND THEN DAILY  . levothyroxine (SYNTHROID, LEVOTHROID) 137 MCG tablet TAKE ONE TABLET BY MOUTH ONCE DAILY BEFORE BREAKFAST  . pantoprazole (PROTONIX) 40 MG tablet Take 40 mg by mouth 2 (two) times daily before a meal.  . sucralfate (CARAFATE) 1 g tablet   . triamterene-hydrochlorothiazide (MAXZIDE-25) 37.5-25 MG tablet Take 1 tablet by mouth daily.  . [DISCONTINUED] furosemide (LASIX) 40 MG tablet TAKE 1 TABLET BY MOUTH EVERY DAY  . [DISCONTINUED] triamterene-hydrochlorothiazide (MAXZIDE-25) 37.5-25 MG tablet TAKE 1 TABLET BY MOUTH EVERY DAY  . triamcinolone cream (KENALOG) 0.1 % Apply 1 application topically 2 (two) times daily.   No facility-administered encounter medications on file as of 04/05/2016.     Review of Systems  Constitutional: Negative for appetite change and unexpected weight change.  HENT: Negative for congestion and sinus pressure.   Respiratory: Negative for cough, chest tightness and shortness of breath.   Cardiovascular: Negative for chest pain, palpitations and leg swelling.  Gastrointestinal: Negative for abdominal pain, diarrhea, nausea and vomiting.  Genitourinary: Negative for difficulty urinating and dysuria.  Musculoskeletal: Negative for joint swelling and myalgias.  Skin: Negative for color change and rash.  Neurological: Negative for dizziness, light-headedness and headaches.  Psychiatric/Behavioral: Negative for agitation and dysphoric mood.       Objective:    Physical Exam  Constitutional: She appears well-developed and well-nourished. No distress.  HENT:  Nose: Nose normal.  Mouth/Throat: Oropharynx is clear and moist.  Neck: Neck supple. No  thyromegaly present.  Cardiovascular: Normal rate and regular rhythm.   Pulmonary/Chest: Breath sounds normal. No respiratory distress. She has no wheezes.  Abdominal: Soft. Bowel sounds are normal. There is no tenderness.  Musculoskeletal: She exhibits no edema or tenderness.  Lymphadenopathy:    She has no cervical adenopathy.  Skin: No rash noted. No erythema.  Psychiatric: She has a normal mood and affect. Her behavior is normal.    BP 112/68   Pulse 87   Temp 98.3 F (36.8 C) (Oral)   Ht 5\' 8"  (1.727 m)   Wt 206 lb 12.8 oz (93.8 kg)   LMP 04/05/2016   SpO2 95%   BMI 31.44 kg/m  Wt Readings from Last 3 Encounters:  04/05/16 206 lb 12.8 oz (93.8 kg)  03/08/16 210 lb 15.7 oz (95.7 kg)  02/06/16 205 lb (93 kg)  Lab Results  Component Value Date   WBC 8.2 03/08/2016   HGB 9.1 (L) 03/08/2016   HCT 30.1 (L) 03/08/2016   PLT 331 03/08/2016   GLUCOSE 92 01/23/2016   CHOL 185 01/23/2016   TRIG 173.0 (H) 01/23/2016   HDL 57.80 01/23/2016   LDLDIRECT 121.0 11/16/2014   LDLCALC 93 01/23/2016   ALT 15 11/28/2015   AST 11 11/28/2015   NA 141 01/23/2016   K 3.3 (L) 01/23/2016   CL 100 01/23/2016   CREATININE 0.71 01/23/2016   BUN 14 01/23/2016   CO2 30 01/23/2016   TSH 1.40 02/28/2015   INR 0.99 03/08/2016   HGBA1C 5.4 04/07/2015       Assessment & Plan:   Problem List Items Addressed This Visit    Anemia    Receiving iron infusions.  Seeing hematology.  Follow cbc.  Due f/u 04/18/16.       Depression    On wellbutrin.  Doing better.  Follow.        Edema    Swelling better.  Follow.        Essential hypertension, benign    Blood pressure under good control.  Continue same medication regimen.  Follow pressures.  Follow metabolic panel.        Relevant Medications   triamterene-hydrochlorothiazide (MAXZIDE-25) 37.5-25 MG tablet   furosemide (LASIX) 40 MG tablet   GERD (gastroesophageal reflux disease)    Continue current regimen.  Currently  asymptomatic.  Follow.       H/O gastric bypass    Receiving IV infusions.  Follow cbc.        Hypercholesterolemia    Low cholesterol diet and exercise.  Follow lipid panel.        Relevant Medications   triamterene-hydrochlorothiazide (MAXZIDE-25) 37.5-25 MG tablet   furosemide (LASIX) 40 MG tablet   Hypothyroidism    On thyroid replacement.  Follow tsh.       Melanoma in situ [facility])    Followed by dermatology.       Migraines    Stable.       Relevant Medications   triamterene-hydrochlorothiazide (MAXZIDE-25) 37.5-25 MG tablet   furosemide (LASIX) 40 MG tablet   Obesity (BMI 30-39.9)    Diet and exercise.  Follow.       PCOS (polycystic ovarian syndrome)    Followed by gyn.  Stable.         Other Visit Diagnoses    Medication refill       Relevant Medications   triamterene-hydrochlorothiazide (MAXZIDE-25) 37.5-25 MG tablet       Einar Pheasant, MD

## 2016-04-05 NOTE — Progress Notes (Signed)
Pre visit review using our clinic review tool, if applicable. No additional management support is needed unless otherwise documented below in the visit note. 

## 2016-04-07 ENCOUNTER — Encounter: Payer: Self-pay | Admitting: Internal Medicine

## 2016-04-07 NOTE — Assessment & Plan Note (Signed)
Receiving IV infusions.  Follow cbc.

## 2016-04-07 NOTE — Assessment & Plan Note (Signed)
Receiving iron infusions.  Seeing hematology.  Follow cbc.  Due f/u 04/18/16.

## 2016-04-07 NOTE — Assessment & Plan Note (Signed)
Swelling better.  Follow.   

## 2016-04-07 NOTE — Assessment & Plan Note (Signed)
Stable

## 2016-04-07 NOTE — Assessment & Plan Note (Signed)
On wellbutrin.  Doing better.  Follow.

## 2016-04-07 NOTE — Assessment & Plan Note (Signed)
Blood pressure under good control.  Continue same medication regimen.  Follow pressures.  Follow metabolic panel.   

## 2016-04-07 NOTE — Assessment & Plan Note (Signed)
Continue current regimen.  Currently asymptomatic.  Follow.

## 2016-04-07 NOTE — Assessment & Plan Note (Signed)
Followed by gyn. Stable.   

## 2016-04-07 NOTE — Assessment & Plan Note (Signed)
On thyroid replacement.  Follow tsh.  

## 2016-04-07 NOTE — Assessment & Plan Note (Signed)
Followed by dermatology

## 2016-04-07 NOTE — Assessment & Plan Note (Signed)
Diet and exercise.  Follow.  

## 2016-04-07 NOTE — Assessment & Plan Note (Signed)
Low cholesterol diet and exercise.  Follow lipid panel.   

## 2016-04-08 ENCOUNTER — Encounter: Payer: Self-pay | Admitting: Family

## 2016-04-08 ENCOUNTER — Ambulatory Visit (INDEPENDENT_AMBULATORY_CARE_PROVIDER_SITE_OTHER): Payer: 59 | Admitting: Family

## 2016-04-08 DIAGNOSIS — S61412A Laceration without foreign body of left hand, initial encounter: Secondary | ICD-10-CM | POA: Diagnosis not present

## 2016-04-08 DIAGNOSIS — S61419A Laceration without foreign body of unspecified hand, initial encounter: Secondary | ICD-10-CM | POA: Insufficient documentation

## 2016-04-08 NOTE — Progress Notes (Signed)
Subjective:    Patient ID: Lori Le, female    DOB: Jun 01, 1965, 51 y.o.   MRN: [id number]  Chief Complaint  Patient presents with  . Laceration    cut herself with a knife yesterday in left hand    HPI:  Lori Le is a 51 y.o. female who  has a past medical history of Allergic rhinitis due to allergen; Allergy; Anemia; Arthritis; Chronic idiopathic urticaria; Complication of anesthesia; Depression; GERD (gastroesophageal reflux disease); Gout; Gout; Hashimoto's thyroiditis; Heart murmur; History of eating disorder; Hives; Hyperlipidemia; Hypertension; Hypothyroidism; Lower extremity edema; Melanoma in situ ([place]); Migraine; Mitral valve disorder; PCOS (polycystic ovarian syndrome); PONV (postoperative nausea and vomiting); Thyroid disease; and Ulcer ([place]). and presents today for an acute office visit.   This is a new problem. Associated symptom of a laceration located on her left hand occurred 24 hours ago when she accidentally cut herself with a knife. Has had some difficulty controlling the bleeding. Modifying factors include keeping it clean and dressing with steri-strips and a band-aid. Does have some achiness of her fourth and fifth fingers. No numbness or tingling. Severity is enough that she has decreased grip strength.     Allergies  Allergen Reactions  . Influenza Vaccines Hives  . Lactose Nausea And Vomiting    Can have yogurt Can not have milk and ice cream   . Lactose Intolerance (Gi) Nausea And Vomiting    Can have yogurt Can not have milk and ice cream    . Bacitracin-Neomycin-Polymyxin Rash  . Neosporin + Pain Relief Max St [Neomy-Bacit-Polymyx-Pramoxine] Rash      Outpatient Medications Prior to Visit  Medication Sig Dispense Refill  . buPROPion (WELLBUTRIN XL) 300 MG 24 hr tablet TAKE 1 TABLET (300 MG TOTAL) BY MOUTH DAILY. 30 tablet 2  . furosemide (LASIX) 40 MG tablet Take 1 tablet (40 mg total) by mouth daily. 90 tablet 1  . KLOR-CON 10 10 MEQ  tablet TAKE ONE TABLET TWICE FOR THREE DAYS AND THEN DAILY 30 tablet 2  . levothyroxine (SYNTHROID, LEVOTHROID) 137 MCG tablet TAKE ONE TABLET BY MOUTH ONCE DAILY BEFORE BREAKFAST 90 tablet 0  . pantoprazole (PROTONIX) 40 MG tablet Take 40 mg by mouth 2 (two) times daily before a meal.    . sucralfate (CARAFATE) 1 g tablet     . triamcinolone cream (KENALOG) 0.1 % Apply 1 application topically 2 (two) times daily. 30 g 0  . triamterene-hydrochlorothiazide (MAXZIDE-25) 37.5-25 MG tablet Take 1 tablet by mouth daily. 90 tablet 1   No facility-administered medications prior to visit.      Review of Systems  Constitutional: Negative for chills and fever.  Skin: Positive for wound.  Neurological: Positive for weakness. Negative for numbness.      Objective:    BP 118/74 (BP Location: Left Arm, Patient Position: Sitting, Cuff Size: Large)   Pulse 83   Temp 97.6 F (36.4 C) (Oral)   Resp 16   Ht 5\' 8"  (1.727 m)   Wt 207 lb 12.8 oz (94.3 kg)   LMP 04/05/2016   SpO2 98%   BMI 31.60 kg/m  Nursing note and vital signs reviewed.  Physical Exam  Constitutional: She is oriented to person, place, and time. She appears well-developed and well-nourished. No distress.  Cardiovascular: Normal rate, regular rhythm, normal heart sounds and intact distal pulses.   Pulmonary/Chest: Effort normal and breath sounds normal.  Neurological: She is alert and oriented to person, place, and time.  Skin:  Skin is warm and dry.  Approximately 1.5 cm curvilinear laceration noted on the palm of the left hand. Pulses and capillary refill are intact and appropriate. AROM is normal with mild discomfort. No evidence of infection.   Psychiatric: She has a normal mood and affect. Her behavior is normal. Judgment and thought content normal.       Assessment & Plan:   Problem List Items Addressed This Visit      Other   Laceration of hand    Mild curvilinear laceration of left hand with no foreign body and no  evidence of infection. Tetanus is up to date. Wound approximated with Steri-strips and bandage applied. Clean with soap and water in the next 48 hours. Follow up as needed or for signs of infection.           I am having Ms. Obriant maintain her levothyroxine, pantoprazole, buPROPion, sucralfate, KLOR-CON 10, triamterene-hydrochlorothiazide, furosemide, and triamcinolone cream.   Follow-up: Return if symptoms worsen or fail to improve.  Mauricio Po, FNP

## 2016-04-08 NOTE — Patient Instructions (Signed)
Thank you for choosing [facility].  SUMMARY AND INSTRUCTIONS:  Keep the bandage in place for the next 24-48 hours and keep clean and dry.   May replace strips as needed.  Ice as needed for discomfort.   Move fingers throughout the day.  Clean with soap and water after about 48 hours.  For bleeding apply direct pressure and elevate your hand as needed.   Follow up:  If your symptoms worsen or fail to improve, please contact our office for further instruction, or in case of emergency go directly to the emergency room at the closest medical facility.

## 2016-04-08 NOTE — Assessment & Plan Note (Signed)
Mild curvilinear laceration of left hand with no foreign body and no evidence of infection. Tetanus is up to date. Wound approximated with Steri-strips and bandage applied. Clean with soap and water in the next 48 hours. Follow up as needed or for signs of infection.

## 2016-04-12 ENCOUNTER — Inpatient Hospital Stay: Payer: 59

## 2016-04-16 NOTE — Progress Notes (Deleted)
ANNUAL PREVENTATIVE CARE GYNECOLOGY  ENCOUNTER NOTE  Subjective:       Lori Le is a 51 y.o. No obstetric history on file. female here for a routine annual gynecologic exam. The patient {is/is not/has never been:13135} sexually active. The patient {is/is not:13135} taking hormone replacement therapy. {post-men bleed:13152::"Patient denies post-menopausal vaginal bleeding."} The patient wears seatbelts: {yes/no:311178}. The patient participates in regular exercise: {yes/no/not asked:9010}. Has the patient ever been transfused or tattooed?: {yes/no/not asked:9010}. The patient reports that there {is/is not:9024} domestic violence in her life. Current complaints: 1.  ***    Gynecologic History Patient's last menstrual period was 04/05/2016. Contraception: IUD Last Pap: ***. Results were: {norm/abn:16337} Last mammogram: ***. Results were: {norm/abn:16337} Last Colonoscopy:  Last Dexa Scan:    Obstetric History OB History  No data available    Past Medical History:  Diagnosis Date  . Allergic rhinitis due to allergen   . Allergy   . Anemia   . Arthritis   . Chronic idiopathic urticaria   . Complication of anesthesia    nausea, slow to wake up  . Depression   . GERD (gastroesophageal reflux disease)   . Gout   . Gout   . Hashimoto's thyroiditis   . Heart murmur   . History of eating disorder   . Hives   . Hyperlipidemia   . Hypertension   . Hypothyroidism   . Lower extremity edema   . Melanoma in situ ([place])    left shoulder  . Migraine   . Mitral valve disorder   . PCOS (polycystic ovarian syndrome)   . PONV (postoperative nausea and vomiting)   . Thyroid disease   . Ulcer ([place])     Family History  Problem Relation Age of Onset  . Arthritis Mother   . Hyperlipidemia Mother   . Hypertension Mother   . Diabetes Mother   . Arthritis Father   . Hyperlipidemia Father   . Hypertension Father   . Mental illness Father   . Diabetes Father   . Arthritis  Maternal Grandmother   . Cancer Maternal Grandmother     breast cancer  . Hyperlipidemia Maternal Grandmother   . Hypertension Maternal Grandmother   . Arthritis Maternal Grandfather   . Hyperlipidemia Maternal Grandfather   . Hypertension Maternal Grandfather   . Heart disease Maternal Grandfather     heart attack  . Breast cancer      maternal and paternal grandmother    Past Surgical History:  Procedure Laterality Date  . CHOLECYSTECTOMY  2003  . ESOPHAGOGASTRODUODENOSCOPY (EGD) WITH PROPOFOL N/A 12/05/2015   Procedure: ESOPHAGOGASTRODUODENOSCOPY (EGD) WITH PROPOFOL;  Surgeon: Lollie Sails, MD;  Location: [facility] ENDOSCOPY;  Service: Endoscopy;  Laterality: N/A;  . ESOPHAGOGASTRODUODENOSCOPY (EGD) WITH PROPOFOL N/A 02/06/2016   Procedure: ESOPHAGOGASTRODUODENOSCOPY (EGD) WITH PROPOFOL;  Surgeon: Lollie Sails, MD;  Location: [facility] ENDOSCOPY;  Service: Endoscopy;  Laterality: N/A;  . FOOT SURGERY  1994  . GASTRIC BYPASS  2011  . JOINT REPLACEMENT Left 09/27/2014   HIP  . JOINT REPLACEMENT Left 12/01/2014   KNEE  . KNEE ARTHROSCOPY Right   . KNEE ARTHROSCOPY Left 03/23/2015   Procedure: ARTHROSCOPY KNEE, partial synovectomy;  Surgeon: Hessie Knows, MD;  Location: [facility] ORS;  Service: Orthopedics;  Laterality: Left;  . LAPAROSCOPIC GASTRIC BANDING  2010  . SHOULDER ARTHROSCOPY Left 06/20/2015   Procedure: ARTHROSCOPY SHOULDER, REPAIR OF MASSIVE ROTATOR CUFF TEAR, TENODESIS, DECOMPRESSION DEBRIDEMENT;  Surgeon: Corky Mull, MD;  Location: [facility] ORS;  Service: Orthopedics;  Laterality: Left;  . TOTAL HIP ARTHROPLASTY Left 09/27/2014   Procedure: TOTAL HIP ARTHROPLASTY ANTERIOR APPROACH;  Surgeon: Hessie Knows, MD;  Location: [facility] ORS;  Service: Orthopedics;  Laterality: Left;  . TOTAL KNEE ARTHROPLASTY Left 12/01/2014   Procedure: TOTAL KNEE ARTHROPLASTY;  Surgeon: Hessie Knows, MD;  Location: [facility] ORS;  Service: Orthopedics;  Laterality: Left;    Social History   Social History    . Marital status: Married    Spouse name: N/A  . Number of children: N/A  . Years of education: N/A   Occupational History  . Not on file.   Social History Main Topics  . Smoking status: Never Smoker  . Smokeless tobacco: Never Used  . Alcohol use 0.0 - 0.6 oz/week     Comment: occ.  . Drug use: No  . Sexual activity: No     Comment: [place]   Other Topics Concern  . Not on file   Social History Narrative  . No narrative on file    Current Outpatient Prescriptions on File Prior to Visit  Medication Sig Dispense Refill  . buPROPion (WELLBUTRIN XL) 300 MG 24 hr tablet TAKE 1 TABLET (300 MG TOTAL) BY MOUTH DAILY. 30 tablet 2  . furosemide (LASIX) 40 MG tablet Take 1 tablet (40 mg total) by mouth daily. 90 tablet 1  . KLOR-CON 10 10 MEQ tablet TAKE ONE TABLET TWICE FOR THREE DAYS AND THEN DAILY 30 tablet 2  . levothyroxine (SYNTHROID, LEVOTHROID) 137 MCG tablet TAKE ONE TABLET BY MOUTH ONCE DAILY BEFORE BREAKFAST 90 tablet 0  . pantoprazole (PROTONIX) 40 MG tablet Take 40 mg by mouth 2 (two) times daily before a meal.    . sucralfate (CARAFATE) 1 g tablet     . triamcinolone cream (KENALOG) 0.1 % Apply 1 application topically 2 (two) times daily. 30 g 0  . triamterene-hydrochlorothiazide (MAXZIDE-25) 37.5-25 MG tablet Take 1 tablet by mouth daily. 90 tablet 1   No current facility-administered medications on file prior to visit.     Allergies  Allergen Reactions  . Influenza Vaccines Hives  . Lactose Nausea And Vomiting    Can have yogurt Can not have milk and ice cream   . Lactose Intolerance (Gi) Nausea And Vomiting    Can have yogurt Can not have milk and ice cream    . Bacitracin-Neomycin-Polymyxin Rash  . Neosporin + Pain Relief Max St [Neomy-Bacit-Polymyx-Pramoxine] Rash      Review of Systems ROS Review of Systems - General ROS: negative for - chills, fatigue, fever, hot flashes, night sweats, weight gain or weight loss Psychological ROS: negative for -  anxiety, decreased libido, depression, mood swings, physical abuse or sexual abuse Ophthalmic ROS: negative for - blurry vision, eye pain or loss of vision ENT ROS: negative for - headaches, hearing change, visual changes or vocal changes Allergy and Immunology ROS: negative for - hives, itchy/watery eyes or seasonal allergies Hematological and Lymphatic ROS: negative for - bleeding problems, bruising, swollen lymph nodes or weight loss Endocrine ROS: negative for - galactorrhea, hair pattern changes, hot flashes, malaise/lethargy, mood swings, palpitations, polydipsia/polyuria, skin changes, temperature intolerance or unexpected weight changes Breast ROS: negative for - new or changing breast lumps or nipple discharge Respiratory ROS: negative for - cough or shortness of breath Cardiovascular ROS: negative for - chest pain, irregular heartbeat, palpitations or shortness of breath Gastrointestinal ROS: no abdominal pain, change in bowel habits, or black or bloody stools Genito-Urinary ROS: no dysuria, trouble  voiding, or hematuria Musculoskeletal ROS: negative for - joint pain or joint stiffness Neurological ROS: negative for - bowel and bladder control changes Dermatological ROS: negative for rash and skin lesion changes   Objective:   LMP 04/05/2016  CONSTITUTIONAL: Well-developed, well-nourished female in no acute distress.  PSYCHIATRIC: Normal mood and affect. Normal behavior. Normal judgment and thought content. [place]: Alert and oriented to person, place, and time. Normal muscle tone coordination. No cranial nerve deficit noted. HENT:  Normocephalic, atraumatic, External right and left ear normal. Oropharynx is clear and moist EYES: Conjunctivae and EOM are normal. Pupils are equal, round, and reactive to light. No scleral icterus.  NECK: Normal range of motion, supple, no masses.  Normal thyroid.  SKIN: Skin is warm and dry. No rash noted. Not diaphoretic. No erythema. No  pallor. CARDIOVASCULAR: Normal heart rate noted, regular rhythm, no murmur. RESPIRATORY: Clear to auscultation bilaterally. Effort and breath sounds normal, no problems with respiration noted. BREASTS: Symmetric in size. No masses, skin changes, nipple drainage, or lymphadenopathy. ABDOMEN: Soft, normal bowel sounds, no distention noted.  No tenderness, rebound or guarding.  BLADDER: Normal PELVIC:  Bladder {bladder exam:311640}  Urethra: {urethra:311719::"not indicated"}  Vulva: {vulva:311722::"not indicated"}  Vagina: {vagina exam:311643::"not indicated"}  Cervix: {cervix:311644::"not indicated"}  Uterus: {uterus:311718::"not indicated"}  Adnexa: {adnexa:311645::"not indicated"}  RV: {Blank multiple:19196::"External Exam NormaI","No Rectal Masses","Normal Sphincter tone"}  MUSCULOSKELETAL: Normal range of motion. No tenderness.  No cyanosis, clubbing, or edema.  2+ distal pulses. LYMPHATIC: No Axillary, Supraclavicular, or Inguinal Adenopathy.    Assessment:   Annual gynecologic examination 51 y.o. Contraception: {method:5051} {Blank multiple:19196::"Normal BMI","Overweight","Obesity 1","Obesity 2"} Problem List Items Addressed This Visit    None      Plan:  Pap: {Blank multiple:19196::"Pap, Reflex if ASCUS","Pap Co Test","GC/CT NAAT","Not needed","Not done"} Mammogram: {Blank multiple:19196::"Ordered","Not Ordered","Not Indicated","***"} Stool Guaiac Testing:  {Blank multiple:19196::"Ordered","Not Ordered","Not Indicated","***"} Labs: {Blank multiple:19196::"Lipid 1","FBS","TSH","Hemoglobin A1C","Vit D Level""***"} Routine preventative health maintenance measures emphasized: {Blank multiple:19196::"Exercise/Diet/Weight control","Tobacco Warnings","Alcohol/Substance use risks","Stress Management","Peer Pressure Issues","Safe Sex"} *** Return to [place], [place]

## 2016-04-18 ENCOUNTER — Inpatient Hospital Stay: Payer: 59

## 2016-04-19 ENCOUNTER — Other Ambulatory Visit: Payer: Self-pay | Admitting: Hematology and Oncology

## 2016-04-19 ENCOUNTER — Inpatient Hospital Stay: Payer: 59

## 2016-04-19 ENCOUNTER — Inpatient Hospital Stay: Payer: 59 | Admitting: Hematology and Oncology

## 2016-04-19 NOTE — Progress Notes (Deleted)
[facility] day:  03/08/2016  Chief Complaint: Lori Le is a 51 y.o. female with anemia who is referred in consultation by Dr. Nicki Reaper for assessment and management.  HPI:  The patient was last seen in the hematology clinic for initial consultation on 03/08/2016.  Workup on 03/08/2016 revealed a hematocrit of 30.1, hemoglobin 9.1, MCV 70.7, platelets 331,000, WBC 8200 with an ANC of 5800.  Ferritin was 5.  PT and PTT were normal.  Platelet function assay was normal.  She received Venofer 200 mg IV weekly x 4 (03/15/2016 - 04/05/2016).    Past Medical History:  Diagnosis Date  . Allergic rhinitis due to allergen   . Allergy   . Anemia   . Arthritis   . Chronic idiopathic urticaria   . Complication of anesthesia    nausea, slow to wake up  . Depression   . GERD (gastroesophageal reflux disease)   . Gout   . Gout   . Hashimoto's thyroiditis   . Heart murmur   . History of eating disorder   . Hives   . Hyperlipidemia   . Hypertension   . Hypothyroidism   . Lower extremity edema   . Melanoma in situ ([place])    left shoulder  . Migraine   . Mitral valve disorder   . PCOS (polycystic ovarian syndrome)   . PONV (postoperative nausea and vomiting)   . Thyroid disease   . Ulcer [facility])     Past Surgical History:  Procedure Laterality Date  . CHOLECYSTECTOMY  2003  . ESOPHAGOGASTRODUODENOSCOPY (EGD) WITH PROPOFOL N/A 12/05/2015   Procedure: ESOPHAGOGASTRODUODENOSCOPY (EGD) WITH PROPOFOL;  Surgeon: Lollie Sails, MD;  Location: [facility] ENDOSCOPY;  Service: Endoscopy;  Laterality: N/A;  . ESOPHAGOGASTRODUODENOSCOPY (EGD) WITH PROPOFOL N/A 02/06/2016   Procedure: ESOPHAGOGASTRODUODENOSCOPY (EGD) WITH PROPOFOL;  Surgeon: Lollie Sails, MD;  Location: [facility] ENDOSCOPY;  Service: Endoscopy;  Laterality: N/A;  . FOOT SURGERY  1994  . GASTRIC BYPASS  2011  . JOINT REPLACEMENT Left 09/27/2014   HIP  . JOINT REPLACEMENT Left 12/01/2014   KNEE  . KNEE ARTHROSCOPY Right   . KNEE ARTHROSCOPY Left 03/23/2015   Procedure: ARTHROSCOPY KNEE, partial synovectomy;  Surgeon: Hessie Knows, MD;  Location: [facility] ORS;  Service: Orthopedics;  Laterality: Left;  . LAPAROSCOPIC GASTRIC BANDING  2010  . SHOULDER ARTHROSCOPY Left 06/20/2015   Procedure: ARTHROSCOPY SHOULDER, REPAIR OF MASSIVE ROTATOR CUFF TEAR, TENODESIS, DECOMPRESSION DEBRIDEMENT;  Surgeon: Corky Mull, MD;  Location: [facility] ORS;  Service: Orthopedics;  Laterality: Left;  . TOTAL HIP ARTHROPLASTY Left 09/27/2014   Procedure: TOTAL HIP ARTHROPLASTY ANTERIOR APPROACH;  Surgeon: Hessie Knows, MD;  Location: [facility] ORS;  Service: Orthopedics;  Laterality: Left;  . TOTAL KNEE ARTHROPLASTY Left 12/01/2014   Procedure: TOTAL KNEE ARTHROPLASTY;  Surgeon: Hessie Knows, MD;  Location: [facility] ORS;  Service: Orthopedics;  Laterality: Left;    Family History  Problem Relation Age of Onset  . Arthritis Mother   . Hyperlipidemia Mother   . Hypertension Mother   . Diabetes Mother   . Arthritis Father   . Hyperlipidemia Father   . Hypertension Father   . Mental illness Father   . Diabetes Father   . Arthritis Maternal Grandmother   . Cancer Maternal Grandmother     breast cancer  . Hyperlipidemia Maternal Grandmother   . Hypertension Maternal Grandmother   . Arthritis Maternal Grandfather   . Hyperlipidemia Maternal Grandfather   . Hypertension  Maternal Grandfather   . Heart disease Maternal Grandfather     heart attack  . Breast cancer      maternal and paternal grandmother    Social History:  reports that she has never smoked. She has never used smokeless tobacco. She reports that she drinks alcohol. She reports that she does not use drugs.  The patient is accompanied by *** alone today.  Allergies:  Allergies  Allergen Reactions  . Influenza Vaccines Hives  . Lactose Nausea And Vomiting    Can have yogurt Can not have milk and ice cream   . Lactose Intolerance (Gi) Nausea And  Vomiting    Can have yogurt Can not have milk and ice cream    . Bacitracin-Neomycin-Polymyxin Rash  . Neosporin + Pain Relief Max St [Neomy-Bacit-Polymyx-Pramoxine] Rash    Current Medications: Current Outpatient Prescriptions  Medication Sig Dispense Refill  . buPROPion (WELLBUTRIN XL) 300 MG 24 hr tablet TAKE 1 TABLET (300 MG TOTAL) BY MOUTH DAILY. 30 tablet 2  . furosemide (LASIX) 40 MG tablet Take 1 tablet (40 mg total) by mouth daily. 90 tablet 1  . KLOR-CON 10 10 MEQ tablet TAKE ONE TABLET TWICE FOR THREE DAYS AND THEN DAILY 30 tablet 2  . levothyroxine (SYNTHROID, LEVOTHROID) 137 MCG tablet TAKE ONE TABLET BY MOUTH ONCE DAILY BEFORE BREAKFAST 90 tablet 0  . pantoprazole (PROTONIX) 40 MG tablet Take 40 mg by mouth 2 (two) times daily before a meal.    . sucralfate (CARAFATE) 1 g tablet     . triamcinolone cream (KENALOG) 0.1 % Apply 1 application topically 2 (two) times daily. 30 g 0  . triamterene-hydrochlorothiazide (MAXZIDE-25) 37.5-25 MG tablet Take 1 tablet by mouth daily. 90 tablet 1   No current facility-administered medications for this visit.     Review of Systems:  GENERAL:  Feels good.  Active.  No fevers, sweats or weight loss. PERFORMANCE STATUS (ECOG):  *** HEENT:  No visual changes, runny nose, sore throat, mouth sores or tenderness. Lungs: No shortness of breath or cough.  No hemoptysis. Cardiac:  No chest pain, palpitations, orthopnea, or PND. GI:  No nausea, vomiting, diarrhea, constipation, melena or hematochezia. GU:  No urgency, frequency, dysuria, or hematuria. Musculoskeletal:  No back pain.  No joint pain.  No muscle tenderness. Extremities:  No pain or swelling. Skin:  No rashes or skin changes. Neuro:  No headache, numbness or weakness, balance or coordination issues. Endocrine:  No diabetes, thyroid issues, hot flashes or night sweats. Psych:  No mood changes, depression or anxiety. Pain:  No focal pain. Review of systems:  All other systems  reviewed and found to be negative.   Physical Exam: Last menstrual period 04/05/2016. GENERAL:  Well developed, well nourished, sitting comfortably in the exam room in no acute distress. MENTAL STATUS:  Alert and oriented to person, place and time. HEAD:  *** hair.  Normocephalic, atraumatic, face symmetric, no Cushingoid features. EYES:  *** eyes.  Pupils equal round and reactive to light and accomodation.  No conjunctivitis or scleral icterus. ENT:  Oropharynx clear without lesion.  Tongue normal. Mucous membranes moist.  RESPIRATORY:  Clear to auscultation without rales, wheezes or rhonchi. CARDIOVASCULAR:  Regular rate and rhythm without murmur, rub or gallop. ABDOMEN:  Soft, non-tender, with active bowel sounds, and no hepatosplenomegaly.  No masses. SKIN:  No rashes, ulcers or lesions. EXTREMITIES: No edema, no skin discoloration or tenderness.  No palpable cords. LYMPH NODES: No palpable cervical, supraclavicular, axillary or  inguinal adenopathy  NEUROLOGICAL: Unremarkable. PSYCH:  Appropriate.  No visits with results within 3 Day(s) from this visit.  Latest known visit with results is:  Appointment on 03/08/2016  Component Date Value Ref Range Status  . WBC 03/08/2016 8.2  3.6 - 11.0 K/uL Final  . RBC 03/08/2016 4.26  3.80 - 5.20 MIL/uL Final  . Hemoglobin 03/08/2016 9.1* 12.0 - 16.0 g/dL Final  . HCT 03/08/2016 30.1* 35.0 - 47.0 % Final  . MCV 03/08/2016 70.7* 80.0 - 100.0 fL Final  . MCH 03/08/2016 21.5* 26.0 - 34.0 pg Final  . MCHC 03/08/2016 30.4* 32.0 - 36.0 g/dL Final  . RDW 03/08/2016 15.8* 11.5 - 14.5 % Final  . Platelets 03/08/2016 331  150 - 440 K/uL Final  . Neutrophils Relative % 03/08/2016 71  % Final  . Neutro Abs 03/08/2016 5.8  1.4 - 6.5 K/uL Final  . Lymphocytes Relative 03/08/2016 18  % Final  . Lymphs Abs 03/08/2016 1.5  1.0 - 3.6 K/uL Final  . Monocytes Relative 03/08/2016 8  % Final  . Monocytes Absolute 03/08/2016 0.7  0.2 - 0.9 K/uL Final  .  Eosinophils Relative 03/08/2016 1  % Final  . Eosinophils Absolute 03/08/2016 0.1  0 - 0.7 K/uL Final  . Basophils Relative 03/08/2016 2  % Final  . Basophils Absolute 03/08/2016 0.1  0 - 0.1 K/uL Final  . Ferritin 03/08/2016 5* 11 - 307 ng/mL Final  . aPTT 03/08/2016 28  24 - 36 seconds Final  . Prothrombin Time 03/08/2016 13.1  11.4 - 15.2 seconds Final  . INR 03/08/2016 0.99   Final  . PFA Interpretation 03/08/2016          Final   Comment: Platelet function is normal. If patient history/physical examination give strong indication of a bleeding disorder repeat testing for confirmation.        Results of the test should always be interpreted in conjunction with the patient's medical history, clinical presentation and medication history. Patients with Hematocrit values <35.0% or Platelet counts <150,000/uL may result in values above the Laboratory established reference range.   . Collagen / Epinephrine 03/08/2016 109  0 - 193 seconds Final    Assessment:  Lori Le is a 51 y.o. female ***  Plan: 1. *** 2. *** 3. *** 4. *** 5. ***  Lequita Asal, MD  03/08/2016

## 2016-04-24 ENCOUNTER — Inpatient Hospital Stay: Payer: 59

## 2016-04-24 DIAGNOSIS — N920 Excessive and frequent menstruation with regular cycle: Secondary | ICD-10-CM

## 2016-04-24 DIAGNOSIS — D500 Iron deficiency anemia secondary to blood loss (chronic): Secondary | ICD-10-CM | POA: Diagnosis not present

## 2016-04-24 DIAGNOSIS — E538 Deficiency of other specified B group vitamins: Secondary | ICD-10-CM

## 2016-04-24 DIAGNOSIS — D509 Iron deficiency anemia, unspecified: Secondary | ICD-10-CM

## 2016-04-24 LAB — CBC WITH DIFFERENTIAL/PLATELET
Basophils Absolute: 0.1 10*3/uL (ref 0–0.1)
Basophils Relative: 1 %
Eosinophils Absolute: 0.1 10*3/uL (ref 0–0.7)
Eosinophils Relative: 2 %
HCT: 39.5 % (ref 35.0–47.0)
Hemoglobin: 12.6 g/dL (ref 12.0–16.0)
Lymphocytes Relative: 27 %
Lymphs Abs: 1.6 10*3/uL (ref 1.0–3.6)
MCH: 24.4 pg — ABNORMAL LOW (ref 26.0–34.0)
MCHC: 31.9 g/dL — ABNORMAL LOW (ref 32.0–36.0)
MCV: 76.6 fL — ABNORMAL LOW (ref 80.0–100.0)
Monocytes Absolute: 0.5 10*3/uL (ref 0.2–0.9)
Monocytes Relative: 9 %
Neutro Abs: 3.7 10*3/uL (ref 1.4–6.5)
Neutrophils Relative %: 61 %
Platelets: 282 10*3/uL (ref 150–440)
RBC: 5.15 MIL/uL (ref 3.80–5.20)
RDW: 23.7 % — ABNORMAL HIGH (ref 11.5–14.5)
WBC: 5.9 10*3/uL (ref 3.6–11.0)

## 2016-04-24 LAB — IRON AND TIBC
Iron: 47 ug/dL (ref 28–170)
Saturation Ratios: 11 % (ref 10.4–31.8)
TIBC: 415 ug/dL (ref 250–450)
UIBC: 368 ug/dL

## 2016-04-24 LAB — FERRITIN: Ferritin: 24 ng/mL (ref 11–307)

## 2016-04-25 ENCOUNTER — Ambulatory Visit: Payer: 59

## 2016-04-25 ENCOUNTER — Encounter: Payer: Self-pay | Admitting: Hematology and Oncology

## 2016-04-25 ENCOUNTER — Inpatient Hospital Stay (HOSPITAL_BASED_OUTPATIENT_CLINIC_OR_DEPARTMENT_OTHER): Payer: 59 | Admitting: Hematology and Oncology

## 2016-04-25 ENCOUNTER — Other Ambulatory Visit: Payer: Self-pay | Admitting: Hematology and Oncology

## 2016-04-25 VITALS — BP 139/80 | HR 91 | Temp 98.2°F | Resp 18 | Wt 205.7 lb

## 2016-04-25 DIAGNOSIS — I10 Essential (primary) hypertension: Secondary | ICD-10-CM

## 2016-04-25 DIAGNOSIS — F329 Major depressive disorder, single episode, unspecified: Secondary | ICD-10-CM | POA: Diagnosis not present

## 2016-04-25 DIAGNOSIS — N920 Excessive and frequent menstruation with regular cycle: Secondary | ICD-10-CM

## 2016-04-25 DIAGNOSIS — K219 Gastro-esophageal reflux disease without esophagitis: Secondary | ICD-10-CM | POA: Diagnosis not present

## 2016-04-25 DIAGNOSIS — Z79899 Other long term (current) drug therapy: Secondary | ICD-10-CM

## 2016-04-25 DIAGNOSIS — M129 Arthropathy, unspecified: Secondary | ICD-10-CM

## 2016-04-25 DIAGNOSIS — M109 Gout, unspecified: Secondary | ICD-10-CM

## 2016-04-25 DIAGNOSIS — M7989 Other specified soft tissue disorders: Secondary | ICD-10-CM

## 2016-04-25 DIAGNOSIS — Z9049 Acquired absence of other specified parts of digestive tract: Secondary | ICD-10-CM

## 2016-04-25 DIAGNOSIS — R011 Cardiac murmur, unspecified: Secondary | ICD-10-CM | POA: Diagnosis not present

## 2016-04-25 DIAGNOSIS — E785 Hyperlipidemia, unspecified: Secondary | ICD-10-CM | POA: Diagnosis not present

## 2016-04-25 DIAGNOSIS — D500 Iron deficiency anemia secondary to blood loss (chronic): Secondary | ICD-10-CM | POA: Diagnosis not present

## 2016-04-25 DIAGNOSIS — Z9884 Bariatric surgery status: Secondary | ICD-10-CM

## 2016-04-25 DIAGNOSIS — Z85828 Personal history of other malignant neoplasm of skin: Secondary | ICD-10-CM

## 2016-04-25 DIAGNOSIS — E063 Autoimmune thyroiditis: Secondary | ICD-10-CM

## 2016-04-25 DIAGNOSIS — Z8711 Personal history of peptic ulcer disease: Secondary | ICD-10-CM

## 2016-04-25 DIAGNOSIS — E039 Hypothyroidism, unspecified: Secondary | ICD-10-CM

## 2016-04-25 DIAGNOSIS — Z803 Family history of malignant neoplasm of breast: Secondary | ICD-10-CM

## 2016-04-25 NOTE — Progress Notes (Signed)
Patient continues to have problems with constipation.  Otherwise no complaints.

## 2016-04-25 NOTE — Progress Notes (Signed)
Lori Le day:  04/25/2016  Chief Complaint: Lori Le is a 51 y.o. female s/p gastric bypass surgery and subsequent iron deficiency anemia who is seen for reassessment after IV iron.  HPI:  The patient was last seen in the hematology Le for initial consultation on 03/08/2016.  At that time, she described a history of bleeding ulcer in 10/2015..  Follow-up EGD in 01/2016 revealed no evidence of bleeding.  She had heavy menses.  We discussed testing for von Willebrand's disease.  Diet was good.  She was intolerant of oral iron.  We discussed IV iron.  Workup on 03/08/2016 revealed a hematocrit of 30.1, hemoglobin 9.1, MCV 70.7, platelets 331,000, WBC 8200 with an ANC of 5800.  Ferritin was 5.  PT and PTT were normal.  Platelet function assay was normal.  She received Venofer 200 mg IV weekly x 4 (03/15/2016 - 04/05/2016).   Post IV iron, she states that her stomach hurt.  Constipation lasted for days.  CBC on 04/24/2016 revealed a hematocrit of 39.5, hemoglobin 12.6, MCV 76.6, platelets 282,000, WBC 5900 with an ANC of 3700.  Ferritin was 24.  Iron saturation was 11% with a TIBC of 415.  Symptomatically, she feels much better.  She has no trouble with the stairs.  Restless legs are gone.  She denies any melena or hematochezia.  She denies any pica.    Past Medical History:  Diagnosis Date  . Allergic rhinitis due to allergen   . Allergy   . Anemia   . Arthritis   . Chronic idiopathic urticaria   . Complication of anesthesia    nausea, slow to wake up  . Depression   . GERD (gastroesophageal reflux disease)   . Gout   . Gout   . Hashimoto's thyroiditis   . Heart murmur   . History of eating disorder   . Hives   . Hyperlipidemia   . Hypertension   . Hypothyroidism   . Lower extremity edema   . Melanoma in situ ([place])    left shoulder  . Migraine   . Mitral valve disorder   . PCOS (polycystic ovarian syndrome)   . PONV  (postoperative nausea and vomiting)   . Thyroid disease   . Ulcer [facility])     Past Surgical History:  Procedure Laterality Date  . CHOLECYSTECTOMY  2003  . ESOPHAGOGASTRODUODENOSCOPY (EGD) WITH PROPOFOL N/A 12/05/2015   Procedure: ESOPHAGOGASTRODUODENOSCOPY (EGD) WITH PROPOFOL;  Surgeon: Lollie Sails, MD;  Location: [facility] ENDOSCOPY;  Service: Endoscopy;  Laterality: N/A;  . ESOPHAGOGASTRODUODENOSCOPY (EGD) WITH PROPOFOL N/A 02/06/2016   Procedure: ESOPHAGOGASTRODUODENOSCOPY (EGD) WITH PROPOFOL;  Surgeon: Lollie Sails, MD;  Location: [facility] ENDOSCOPY;  Service: Endoscopy;  Laterality: N/A;  . FOOT SURGERY  1994  . GASTRIC BYPASS  2011  . JOINT REPLACEMENT Left 09/27/2014   HIP  . JOINT REPLACEMENT Left 12/01/2014   KNEE  . KNEE ARTHROSCOPY Right   . KNEE ARTHROSCOPY Left 03/23/2015   Procedure: ARTHROSCOPY KNEE, partial synovectomy;  Surgeon: Hessie Knows, MD;  Location: [facility] ORS;  Service: Orthopedics;  Laterality: Left;  . LAPAROSCOPIC GASTRIC BANDING  2010  . SHOULDER ARTHROSCOPY Left 06/20/2015   Procedure: ARTHROSCOPY SHOULDER, REPAIR OF MASSIVE ROTATOR CUFF TEAR, TENODESIS, DECOMPRESSION DEBRIDEMENT;  Surgeon: Corky Mull, MD;  Location: [facility] ORS;  Service: Orthopedics;  Laterality: Left;  . TOTAL HIP ARTHROPLASTY Left 09/27/2014   Procedure: TOTAL HIP ARTHROPLASTY ANTERIOR APPROACH;  Surgeon: Hessie Knows, MD;  Location: [facility] ORS;  Service: Orthopedics;  Laterality: Left;  . TOTAL KNEE ARTHROPLASTY Left 12/01/2014   Procedure: TOTAL KNEE ARTHROPLASTY;  Surgeon: Hessie Knows, MD;  Location: [facility] ORS;  Service: Orthopedics;  Laterality: Left;    Family History  Problem Relation Age of Onset  . Arthritis Mother   . Hyperlipidemia Mother   . Hypertension Mother   . Diabetes Mother   . Arthritis Father   . Hyperlipidemia Father   . Hypertension Father   . Mental illness Father   . Diabetes Father   . Arthritis Maternal Grandmother   . Cancer Maternal Grandmother     breast  cancer  . Hyperlipidemia Maternal Grandmother   . Hypertension Maternal Grandmother   . Arthritis Maternal Grandfather   . Hyperlipidemia Maternal Grandfather   . Hypertension Maternal Grandfather   . Heart disease Maternal Grandfather     heart attack  . Breast cancer      maternal and paternal grandmother    Social History:  reports that she has never smoked. She has never used smokeless tobacco. She reports that she drinks alcohol. She reports that she does not use drugs.  The patient lives in [place].  The patient is alone today.  Allergies:  Allergies  Allergen Reactions  . Influenza Vaccines Hives  . Lactose Nausea And Vomiting    Can have yogurt Can not have milk and ice cream   . Lactose Intolerance (Gi) Nausea And Vomiting    Can have yogurt Can not have milk and ice cream    . Bacitracin-Neomycin-Polymyxin Rash  . Neosporin + Pain Relief Max St [Neomy-Bacit-Polymyx-Pramoxine] Rash    Current Medications: Current Outpatient Prescriptions  Medication Sig Dispense Refill  . buPROPion (WELLBUTRIN XL) 300 MG 24 hr tablet TAKE 1 TABLET (300 MG TOTAL) BY MOUTH DAILY. 30 tablet 2  . furosemide (LASIX) 40 MG tablet Take 1 tablet (40 mg total) by mouth daily. 90 tablet 1  . KLOR-CON 10 10 MEQ tablet TAKE ONE TABLET TWICE FOR THREE DAYS AND THEN DAILY 30 tablet 2  . levothyroxine (SYNTHROID, LEVOTHROID) 137 MCG tablet TAKE ONE TABLET BY MOUTH ONCE DAILY BEFORE BREAKFAST 90 tablet 0  . pantoprazole (PROTONIX) 40 MG tablet Take 40 mg by mouth 2 (two) times daily before a meal.    . sucralfate (CARAFATE) 1 g tablet     . triamcinolone cream (KENALOG) 0.1 % Apply 1 application topically 2 (two) times daily. 30 g 0  . triamterene-hydrochlorothiazide (MAXZIDE-25) 37.5-25 MG tablet Take 1 tablet by mouth daily. 90 tablet 1   No current facility-administered medications for this visit.     Review of Systems:  GENERAL:  Feels better.  No fevers or sweats.  Weight loss of 5  pounds. PERFORMANCE STATUS (ECOG):  0 HEENT:  No visual changes, runny nose, sore throat, mouth sores or tenderness. Lungs: No shortness of breath or cough.  No hemoptysis. Cardiac:  No chest pain, palpitations, orthopnea, or PND. GI:  No nausea, vomiting, diarrhea, constipation, melena or hematochezia. GU:  No urgency, frequency, dysuria, or hematuria. Musculoskeletal:  No back pain.  No joint pain.  No muscle tenderness. Extremities:  No pain or swelling. Skin:  No rashes or skin changes. Neuro:  No headache, numbness or weakness, balance or coordination issues.  Restless legs, resolved. Endocrine:  No diabetes.  Thyroid disease on Synthroid.  No hot flashes or night sweats. Psych:  No mood changes, depression or anxiety. Pain:  No focal pain. Review of systems:  All other systems reviewed and found to be negative.  Physical Exam: Blood pressure 139/80, pulse 91, temperature 98.2 F (36.8 C), resp. rate 18, weight 205 lb 11 oz (93.3 kg), last menstrual period 04/05/2016. GENERAL:  Well developed, well nourished, tall woman sitting comfortably in the exam room in no acute distress. MENTAL STATUS:  Alert and oriented to person, place and time. HEAD:  Short brown hair.  Normocephalic, atraumatic, face symmetric, no Cushingoid features. EYES:  Blue eyes.  Pupils equal round and reactive to light and accomodation.  No conjunctivitis or scleral icterus. ENT:  Oropharynx clear without lesion.  Tongue normal. Mucous membranes moist.  RESPIRATORY:  Clear to auscultation without rales, wheezes or rhonchi. CARDIOVASCULAR:  Regular rate and rhythm without murmur, rub or gallop. ABDOMEN:  Soft, non-tender, with active bowel sounds, and no appreciable hepatosplenomegaly.  No masses. SKIN:  No rashes, ulcers or lesions. EXTREMITIES: No edema, no skin discoloration or tenderness.  No palpable cords. LYMPH NODES: No palpable cervical, supraclavicular, axillary or inguinal adenopathy  NEUROLOGICAL:  Unremarkable. PSYCH:  Appropriate.   Appointment on 04/24/2016  Component Date Value Ref Range Status  . WBC 04/24/2016 5.9  3.6 - 11.0 K/uL Final  . RBC 04/24/2016 5.15  3.80 - 5.20 MIL/uL Final  . Hemoglobin 04/24/2016 12.6  12.0 - 16.0 g/dL Final  . HCT 04/24/2016 39.5  35.0 - 47.0 % Final  . MCV 04/24/2016 76.6* 80.0 - 100.0 fL Final  . MCH 04/24/2016 24.4* 26.0 - 34.0 pg Final  . MCHC 04/24/2016 31.9* 32.0 - 36.0 g/dL Final  . RDW 04/24/2016 23.7* 11.5 - 14.5 % Final  . Platelets 04/24/2016 282  150 - 440 K/uL Final  . Neutrophils Relative % 04/24/2016 61  % Final  . Neutro Abs 04/24/2016 3.7  1.4 - 6.5 K/uL Final  . Lymphocytes Relative 04/24/2016 27  % Final  . Lymphs Abs 04/24/2016 1.6  1.0 - 3.6 K/uL Final  . Monocytes Relative 04/24/2016 9  % Final  . Monocytes Absolute 04/24/2016 0.5  0.2 - 0.9 K/uL Final  . Eosinophils Relative 04/24/2016 2  % Final  . Eosinophils Absolute 04/24/2016 0.1  0 - 0.7 K/uL Final  . Basophils Relative 04/24/2016 1  % Final  . Basophils Absolute 04/24/2016 0.1  0 - 0.1 K/uL Final  . Ferritin 04/24/2016 24  11 - 307 ng/mL Final  . Iron 04/24/2016 47  28 - 170 ug/dL Final  . TIBC 04/24/2016 415  250 - 450 ug/dL Final  . Saturation Ratios 04/24/2016 11  10.4 - 31.8 % Final  . UIBC 04/24/2016 368  ug/dL Final    Assessment:  ADRIYANNA DIEROLF is a 51 y.o. female s/p gastric bypass surgery (2011) with iron deficiency anemia. She has a history of bleeding ulcer in 10/2015.  She has heavy menses.  She bruises easily.  EGD on 12/05/2015 revealed edema and erythema of the gastrojejunal anastomosis. She was treated with Carafate and Protonix.  Repeat EGD on 02/06/2016 revealed minimal erythema on the tips of some gastric folds.  She has never had a colonoscopy.  Diet is good.  She denies any ice pica.  She is intolerant of oral iron secondary to severe constipation.  She has received B12.  Workup on 03/08/2016 revealed a hematocrit of 30.1,  hemoglobin 9.1, MCV 70.7, platelets 331,000, WBC 8200 with an ANC of 5800.  Ferritin was 5.  PT and PTT were normal.  Platelet function assay was normal.  She received Venofer 200  mg IV weekly x 4 (03/15/2016 - 04/05/2016).  She receives Venofer if her ferritin is < 30.  She has a family history of breast cancer.  She is unaware of her last mammogram.  Symptomatically, her energy level has improved.  Exam is unremarkable.  Plan: 1.  Review initial work-up, diagnosis of iron deficiency anemia and treatment with IV iron.  Discuss normalization of hematocrit and hemoglobin.  RBCs are still microcytic.  Ferritin is 24. 2.  Venofer tomorrow. 3.  Discuss checking von Willebrand's panel with heavy menses. 4.  RTC in 3 months for labs (CBC with diff, ferritin) 5.  RTC in 6 months for MD assessment, labs (CBC with diff, iron studies, ferritin) +/- IV iron.   Lori Asal, MD  03/08/2016

## 2016-04-26 ENCOUNTER — Inpatient Hospital Stay: Payer: 59

## 2016-04-26 VITALS — BP 120/77 | HR 85 | Temp 97.1°F | Resp 20

## 2016-04-26 DIAGNOSIS — D500 Iron deficiency anemia secondary to blood loss (chronic): Secondary | ICD-10-CM

## 2016-04-26 MED ORDER — SODIUM CHLORIDE 0.9 % IV SOLN
Freq: Once | INTRAVENOUS | Status: AC
  Administered 2016-04-26: 14:00:00 via INTRAVENOUS
  Filled 2016-04-26: qty 1000

## 2016-04-26 MED ORDER — IRON SUCROSE 20 MG/ML IV SOLN
200.0000 mg | Freq: Once | INTRAVENOUS | Status: AC
  Administered 2016-04-26: 200 mg via INTRAVENOUS
  Filled 2016-04-26: qty 10

## 2016-04-26 MED ORDER — SODIUM CHLORIDE 0.9 % IV SOLN: 200.0000 mg | Freq: Once | INTRAVENOUS | Status: DC

## 2016-04-30 ENCOUNTER — Ambulatory Visit (INDEPENDENT_AMBULATORY_CARE_PROVIDER_SITE_OTHER): Payer: 59 | Admitting: Obstetrics and Gynecology

## 2016-04-30 ENCOUNTER — Encounter: Payer: Self-pay | Admitting: Obstetrics and Gynecology

## 2016-04-30 VITALS — BP 114/72 | HR 91 | Ht 67.5 in | Wt 204.8 lb

## 2016-04-30 DIAGNOSIS — Z1231 Encounter for screening mammogram for malignant neoplasm of breast: Secondary | ICD-10-CM

## 2016-04-30 DIAGNOSIS — Z01419 Encounter for gynecological examination (general) (routine) without abnormal findings: Secondary | ICD-10-CM | POA: Diagnosis not present

## 2016-04-30 DIAGNOSIS — N952 Postmenopausal atrophic vaginitis: Secondary | ICD-10-CM

## 2016-04-30 DIAGNOSIS — N920 Excessive and frequent menstruation with regular cycle: Secondary | ICD-10-CM

## 2016-04-30 DIAGNOSIS — Z1211 Encounter for screening for malignant neoplasm of colon: Secondary | ICD-10-CM

## 2016-04-30 DIAGNOSIS — Z1239 Encounter for other screening for malignant neoplasm of breast: Secondary | ICD-10-CM

## 2016-04-30 MED ORDER — ESTRADIOL 0.1 MG/GM VA CREA: 1.0000 | TOPICAL_CREAM | VAGINAL | 2 refills | Status: DC

## 2016-04-30 MED ORDER — TRANEXAMIC ACID 650 MG PO TABS
1300.0000 mg | ORAL_TABLET | Freq: Three times a day (TID) | ORAL | 2 refills | Status: DC
Start: 2016-04-30 — End: 2016-06-18

## 2016-04-30 NOTE — Progress Notes (Signed)
ANNUAL PREVENTATIVE CARE GYN  ENCOUNTER NOTE  Subjective:       Lori Le is a 51 y.o. P0 female here for a routine annual gynecologic exam.  Current complaints: none.   The patient is not currently sexually active.  The patient does wear seatbelts.  She denies history of tattoos or blood transfusions.     Gynecologic History Patient's last menstrual period was 04/22/2016. Contraception: IUD (Paraguard) Last Pap: 12/2013. Results were: normal.  Denies h/o abnormal pap smears.  Last mammogram: 03/2014. Results were: normal Denies h/o STIs.   Obstetric History OB History  Gravida Para Term Preterm AB Living  0 0 0 0 0 0  SAB TAB Ectopic Multiple Live Births  0 0 0 0 0        Past Medical History:  Diagnosis Date  . Allergic rhinitis due to allergen   . Allergy   . Anemia   . Arthritis   . Chronic idiopathic urticaria   . Complication of anesthesia    nausea, slow to wake up  . Depression   . GERD (gastroesophageal reflux disease)   . Gout   . Gout   . Hashimoto's thyroiditis   . Heart murmur   . History of eating disorder   . Hives   . Hyperlipidemia   . Hypertension   . Hypothyroidism   . Lower extremity edema   . Melanoma in situ ([place])    left shoulder  . Migraine   . Mitral valve disorder   . PCOS (polycystic ovarian syndrome)   . PONV (postoperative nausea and vomiting)   . Thyroid disease   . Ulcer [facility])     Past Surgical History:  Procedure Laterality Date  . CHOLECYSTECTOMY  2003  . ESOPHAGOGASTRODUODENOSCOPY (EGD) WITH PROPOFOL N/A 12/05/2015   Procedure: ESOPHAGOGASTRODUODENOSCOPY (EGD) WITH PROPOFOL;  Surgeon: Lollie Sails, MD;  Location: [facility] ENDOSCOPY;  Service: Endoscopy;  Laterality: N/A;  . ESOPHAGOGASTRODUODENOSCOPY (EGD) WITH PROPOFOL N/A 02/06/2016   Procedure: ESOPHAGOGASTRODUODENOSCOPY (EGD) WITH PROPOFOL;  Surgeon: Lollie Sails, MD;  Location: [facility] ENDOSCOPY;  Service: Endoscopy;  Laterality: N/A;  . FOOT SURGERY  1994   . GASTRIC BYPASS  2011  . JOINT REPLACEMENT Left 09/27/2014   HIP  . JOINT REPLACEMENT Left 12/01/2014   KNEE  . KNEE ARTHROSCOPY Right   . KNEE ARTHROSCOPY Left 03/23/2015   Procedure: ARTHROSCOPY KNEE, partial synovectomy;  Surgeon: Hessie Knows, MD;  Location: [facility] ORS;  Service: Orthopedics;  Laterality: Left;  . LAPAROSCOPIC GASTRIC BANDING  2010  . SHOULDER ARTHROSCOPY Left 06/20/2015   Procedure: ARTHROSCOPY SHOULDER, REPAIR OF MASSIVE ROTATOR CUFF TEAR, TENODESIS, DECOMPRESSION DEBRIDEMENT;  Surgeon: Corky Mull, MD;  Location: [facility] ORS;  Service: Orthopedics;  Laterality: Left;  . TOTAL HIP ARTHROPLASTY Left 09/27/2014   Procedure: TOTAL HIP ARTHROPLASTY ANTERIOR APPROACH;  Surgeon: Hessie Knows, MD;  Location: [facility] ORS;  Service: Orthopedics;  Laterality: Left;  . TOTAL KNEE ARTHROPLASTY Left 12/01/2014   Procedure: TOTAL KNEE ARTHROPLASTY;  Surgeon: Hessie Knows, MD;  Location: [facility] ORS;  Service: Orthopedics;  Laterality: Left;    Current Outpatient Prescriptions on File Prior to Visit  Medication Sig Dispense Refill  . buPROPion (WELLBUTRIN XL) 300 MG 24 hr tablet TAKE 1 TABLET (300 MG TOTAL) BY MOUTH DAILY. 30 tablet 2  . furosemide (LASIX) 40 MG tablet Take 1 tablet (40 mg total) by mouth daily. 90 tablet 1  . KLOR-CON 10 10 MEQ tablet TAKE ONE TABLET TWICE FOR THREE  DAYS AND THEN DAILY 30 tablet 2  . levothyroxine (SYNTHROID, LEVOTHROID) 137 MCG tablet TAKE ONE TABLET BY MOUTH ONCE DAILY BEFORE BREAKFAST 90 tablet 0  . pantoprazole (PROTONIX) 40 MG tablet Take 40 mg by mouth 2 (two) times daily before a meal.    . sucralfate (CARAFATE) 1 g tablet     . triamcinolone cream (KENALOG) 0.1 % Apply 1 application topically 2 (two) times daily. 30 g 0  . triamterene-hydrochlorothiazide (MAXZIDE-25) 37.5-25 MG tablet Take 1 tablet by mouth daily. 90 tablet 1   No current facility-administered medications on file prior to visit.     Allergies  Allergen Reactions  . Influenza  Vaccines Hives  . Lactose Nausea And Vomiting    Can have yogurt Can not have milk and ice cream   . Lactose Intolerance (Gi) Nausea And Vomiting    Can have yogurt Can not have milk and ice cream    . Bacitracin-Neomycin-Polymyxin Rash  . Neosporin + Pain Relief Max St [Neomy-Bacit-Polymyx-Pramoxine] Rash    Social History   Social History  . Marital status: Married    Spouse name: N/A  . Number of children: N/A  . Years of education: N/A   Occupational History  . Not on file.   Social History Main Topics  . Smoking status: Never Smoker  . Smokeless tobacco: Never Used  . Alcohol use 0.0 - 0.6 oz/week     Comment: occ.  . Drug use: No  . Sexual activity: No     Comment: Paragard   Other Topics Concern  . Not on file   Social History Narrative  . No narrative on file    Family History  Problem Relation Age of Onset  . Arthritis Mother   . Hyperlipidemia Mother   . Hypertension Mother   . Diabetes Mother   . Arthritis Father   . Hyperlipidemia Father   . Hypertension Father   . Mental illness Father   . Diabetes Father   . Arthritis Maternal Grandmother   . Cancer Maternal Grandmother     breast cancer  . Hyperlipidemia Maternal Grandmother   . Hypertension Maternal Grandmother   . Arthritis Maternal Grandfather   . Hyperlipidemia Maternal Grandfather   . Hypertension Maternal Grandfather   . Heart disease Maternal Grandfather     heart attack  . Breast cancer      maternal and paternal grandmother    The following portions of the patient's history were reviewed and updated as appropriate: allergies, current medications, past family history, past medical history, past social history, past surgical history and problem list.  Review of Systems ROS Review of Systems - General ROS: negative for - chills, fatigue, fever, hot flashes, night sweats, weight gain or weight loss Psychological ROS: negative for - anxiety, decreased libido, depression, mood  swings, physical abuse or sexual abuse Ophthalmic ROS: negative for - blurry vision, eye pain or loss of vision ENT ROS: negative for - headaches, hearing change, visual changes or vocal changes Allergy and Immunology ROS: negative for - hives, itchy/watery eyes or seasonal allergies Hematological and Lymphatic ROS: negative for - bleeding problems, bruising, swollen lymph nodes or weight loss Endocrine ROS: negative for - galactorrhea, hair pattern changes, hot flashes, malaise/lethargy, mood swings, palpitations, polydipsia/polyuria, skin changes, temperature intolerance or unexpected weight changes Breast ROS: negative for - new or changing breast lumps or nipple discharge Respiratory ROS: negative for - cough or shortness of breath Cardiovascular ROS: negative for - chest pain, irregular  heartbeat, palpitations or shortness of breath Gastrointestinal ROS: no abdominal pain, change in bowel habits, or black or bloody stools Genito-Urinary [place]:[id number] for prolonged menses (previously 7-10 days), no dysuria, trouble voiding, or hematuria.  Vaginal dryness. Musculoskeletal ROS: negative for - joint pain or joint stiffness Neurological ROS: negative for - bowel and bladder control changes Dermatological ROS: negative for rash and skin lesion changes   Objective:   BP 114/72 (BP Location: Left Arm, Patient Position: Sitting, Cuff Size: Normal)   Pulse 91   Ht 5' 7.5" (1.715 m)   Wt 204 lb 12.8 oz (92.9 kg)   LMP 04/22/2016   BMI 31.60 kg/m  CONSTITUTIONAL: Well-developed, well-nourished female in no acute distress.  PSYCHIATRIC: Normal mood and affect. Normal behavior. Normal judgment and thought content. [place]: Alert and oriented to person, place, and time. Normal muscle tone coordination. No cranial nerve deficit noted. HENT:  Normocephalic, atraumatic, External right and left ear normal. Oropharynx is clear and moist EYES: Conjunctivae and EOM are normal. Pupils are equal, round,  and reactive to light. No scleral icterus.  NECK: Normal range of motion, supple, no masses.  Normal thyroid.  SKIN: Skin is warm and dry. No rash noted. Not diaphoretic. No erythema. No pallor. CARDIOVASCULAR: Normal heart rate noted, regular rhythm, no murmur. RESPIRATORY: Clear to auscultation bilaterally. Effort and breath sounds normal, no problems with respiration noted. BREASTS: Symmetric in size. No masses, skin changes, nipple drainage, or lymphadenopathy. ABDOMEN: Soft, normal bowel sounds, no distention noted.  No tenderness, rebound or guarding.  BLADDER: Normal PELVIC:  Bladder: no bladder distension noted  Vulva: normal appearing vulva with no masses, tenderness or lesions  Vagina: normal appearing vagina with normal color and discharge, no lesions.  Atrophic.  Cervix: normal appearing cervix without discharge or lesions; IUD strings visible 3 cm from cervical os  Uterus: uterus is normal size, shape, consistency and nontender  Adnexa: normal adnexa in size, nontender and no masses  Rectal: normal rectal, no masses MUSCULOSKELETAL: Normal range of motion. No tenderness.  No cyanosis, clubbing, or edema.  2+ distal pulses. LYMPHATIC: No Axillary, Supraclavicular, or Inguinal Adenopathy.   Labs:  Lab Results  Component Value Date   WBC 6.1 04/07/2015   HGB 13.1 04/07/2015   HCT 40.4 04/07/2015   MCV 85 04/07/2015   PLT 300 04/07/2015   Lab Results  Component Value Date   CREATININE 0.71 01/23/2016   BUN 14 01/23/2016   NA 141 01/23/2016   K 3.3 (L) 01/23/2016   CL 100 01/23/2016   CO2 30 01/23/2016   Lab Results  Component Value Date   ALT 15 11/28/2015   AST 11 11/28/2015   ALKPHOS 49 11/28/2015   BILITOT 0.4 11/28/2015    Lab Results  Component Value Date   CHOL 185 01/23/2016   HDL 57.80 01/23/2016   LDLCALC 93 01/23/2016   LDLDIRECT 121.0 11/16/2014   TRIG 173.0 (H) 01/23/2016   CHOLHDL 3 01/23/2016   Lab Results  Component Value Date   TSH  1.40 02/28/2015     Assessment:   Annual gynecologic examination 51 y.o.  Prolonged menses Vaginal atrophy Obesity 1  Plan:  Pap: Not needed.  Patient is up to date Mammogram: Ordered  Stool Guaiac Testing:  Not Indicated Labs: not done.  Has had recent bloodwork.   Contraception: IUD Pharmacist, hospital) Routine preventative health maintenance measures emphasized: Exercise/Diet/Weight control, Alcohol/Substance use risks and Stress Management Prolonged menses - may be related to perimenopausal status or IUD (Paraguard  is know to cause heavier or longer periods in some patients).  Patient notes that symptoms are not bothersome to the point of desiring intervention.  Will continue to monitor.  Vaginal atrophy - discussed hormonal vs non-hormonal treatment options. Patient ok with local hormone therapy.  Given samples of Estrace cream.  Also prescription written.  To f/u in 4-6 weeks to reassess symptoms.  Return to [place] for annual exam.    [name], MD [facility]

## 2016-05-01 ENCOUNTER — Other Ambulatory Visit: Payer: Self-pay | Admitting: Obstetrics and Gynecology

## 2016-05-01 DIAGNOSIS — Z1231 Encounter for screening mammogram for malignant neoplasm of breast: Secondary | ICD-10-CM

## 2016-05-08 ENCOUNTER — Ambulatory Visit
Admission: RE | Admit: 2016-05-08 | Discharge: 2016-05-08 | Disposition: A | Discharge status: Home / Self Care | Payer: 59 | Source: Ambulatory Visit | Attending: Obstetrics and Gynecology | Admitting: Obstetrics and Gynecology

## 2016-05-08 DIAGNOSIS — Z1231 Encounter for screening mammogram for malignant neoplasm of breast: Secondary | ICD-10-CM | POA: Insufficient documentation

## 2016-05-15 ENCOUNTER — Inpatient Hospital Stay
Admission: RE | Admit: 2016-05-15 | Discharge: 2016-05-15 | Disposition: A | Discharge status: Home / Self Care | Payer: Self-pay | Source: Ambulatory Visit | Attending: *Deleted | Admitting: *Deleted

## 2016-05-15 ENCOUNTER — Other Ambulatory Visit: Payer: Self-pay

## 2016-05-15 ENCOUNTER — Telehealth: Payer: Self-pay

## 2016-05-15 ENCOUNTER — Other Ambulatory Visit: Payer: Self-pay | Admitting: *Deleted

## 2016-05-15 DIAGNOSIS — Z9289 Personal history of other medical treatment: Secondary | ICD-10-CM

## 2016-05-15 NOTE — Telephone Encounter (Signed)
The notification/prior authorization case information was transmitted on 05/15/2016 at 3:06 PM CST. The notification/prior authorization reference number is [id number]. Please print this page for your records.  Your Notification/Prior Authorization submission has been Approved and no further action is required for this request

## 2016-05-15 NOTE — Telephone Encounter (Signed)
Gastroenterology Pre-Procedure Review  Request Date:  Requesting Physician: Dr.   PATIENT REVIEW QUESTIONS: The patient responded to the following health history questions as indicated:    1. Are you having any GI issues? no 2. Do you have a personal history of Polyps? no 3. Do you have a family history of Colon Cancer or Polyps? no 4. Diabetes Mellitus? no 5. Joint replacements in the past 12 months?yes (yes, needs antibiotic) 6. Major health problems in the past 3 months?no 7. Any artificial heart valves, MVP, or defibrillator?no    MEDICATIONS & ALLERGIES:    Patient reports the following regarding taking any anticoagulation/antiplatelet therapy:   Plavix, Coumadin, Eliquis, Xarelto, Lovenox, Pradaxa, Brilinta, or Effient? no Aspirin? no  Patient confirms/reports the following medications:  Current Outpatient Prescriptions  Medication Sig Dispense Refill  . buPROPion (WELLBUTRIN XL) 300 MG 24 hr tablet TAKE 1 TABLET (300 MG TOTAL) BY MOUTH DAILY. 30 tablet 2  . estradiol (ESTRACE VAGINAL) 0.1 MG/GM vaginal cream Place 1 Applicatorful vaginally 3 (three) times a week. 42.5 g 2  . furosemide (LASIX) 40 MG tablet Take 1 tablet (40 mg total) by mouth daily. 90 tablet 1  . KLOR-CON 10 10 MEQ tablet TAKE ONE TABLET TWICE FOR THREE DAYS AND THEN DAILY 30 tablet 2  . levothyroxine (SYNTHROID, LEVOTHROID) 137 MCG tablet TAKE ONE TABLET BY MOUTH ONCE DAILY BEFORE BREAKFAST 90 tablet 0  . pantoprazole (PROTONIX) 40 MG tablet Take 40 mg by mouth 2 (two) times daily before a meal.    . sucralfate (CARAFATE) 1 g tablet     . tranexamic acid (LYSTEDA) 650 MG TABS tablet Take 2 tablets (1,300 mg total) by mouth 3 (three) times daily. Take during menses for a maximum of five days 30 tablet 2  . triamcinolone cream (KENALOG) 0.1 % Apply 1 application topically 2 (two) times daily. 30 g 0  . triamterene-hydrochlorothiazide (MAXZIDE-25) 37.5-25 MG tablet Take 1 tablet by mouth daily. 90 tablet 1    No current facility-administered medications for this visit.     Patient confirms/reports the following allergies:  Allergies  Allergen Reactions  . Influenza Vaccines Hives  . Lactose Nausea And Vomiting    Can have yogurt Can not have milk and ice cream   . Lactose Intolerance (Gi) Nausea And Vomiting    Can have yogurt Can not have milk and ice cream    . Bacitracin-Neomycin-Polymyxin Rash  . Neosporin + Pain Relief Max St [Neomy-Bacit-Polymyx-Pramoxine] Rash    No orders of the defined types were placed in this encounter.   AUTHORIZATION INFORMATION Primary Insurance: 1D#: Group #:  Secondary Insurance: 1D#: Group #:  SCHEDULE INFORMATION: Date: 06/03/16 Time: Location: [place]

## 2016-05-27 ENCOUNTER — Other Ambulatory Visit: Payer: Self-pay | Admitting: Internal Medicine

## 2016-05-29 ENCOUNTER — Encounter: Payer: Self-pay | Admitting: *Deleted

## 2016-05-30 ENCOUNTER — Encounter: Payer: Self-pay | Admitting: Anesthesiology

## 2016-06-03 ENCOUNTER — Ambulatory Visit: Admission: RE | Admit: 2016-06-03 | Payer: 59 | Source: Ambulatory Visit | Admitting: Gastroenterology

## 2016-06-03 HISTORY — DX: Dizziness and giddiness: R42

## 2016-06-03 HISTORY — DX: Motion sickness, initial encounter: T75.3XXA

## 2016-06-03 SURGERY — COLONOSCOPY WITH PROPOFOL
Anesthesia: Choice

## 2016-06-11 ENCOUNTER — Encounter: Payer: 59 | Admitting: Obstetrics and Gynecology

## 2016-06-18 ENCOUNTER — Ambulatory Visit (INDEPENDENT_AMBULATORY_CARE_PROVIDER_SITE_OTHER): Payer: 59 | Admitting: Obstetrics and Gynecology

## 2016-06-18 ENCOUNTER — Encounter: Payer: Self-pay | Admitting: Obstetrics and Gynecology

## 2016-06-18 VITALS — BP 121/73 | HR 84 | Ht 67.5 in | Wt 213.2 lb

## 2016-06-18 DIAGNOSIS — N952 Postmenopausal atrophic vaginitis: Secondary | ICD-10-CM | POA: Diagnosis not present

## 2016-06-18 DIAGNOSIS — N924 Excessive bleeding in the premenopausal period: Secondary | ICD-10-CM | POA: Diagnosis not present

## 2016-06-18 MED ORDER — NORETHINDRONE ACETATE 5 MG PO TABS: 5.0000 mg | ORAL_TABLET | Freq: Every day | ORAL | 2 refills | Status: DC

## 2016-06-18 NOTE — Progress Notes (Signed)
    GYNECOLOGY PROGRESS NOTE  Subjective:    Patient ID: Lori Le, female    DOB: January 26, 1966, 51 y.o.   MRN: [id number]  HPI  Patient is a 51 y.o. G17P0000 female who presents for 6 week follow up of vaginal dryness and menorrhagia.  Patient notes that she has been having a heavier onset of menstrual cycles over the past few months with passage of large clots.  Was prescribed Lysteda last visit, however notes that this made her bleeding even heavier.    Has been using the Estrace cream samples to treat vaginal dryness.  States that she has noticed a modest improvement. Notes that her pharmacy has not filled her prescription for the Estrace cream (unsure if it is due to insurance, was not told why).     The following portions of the patient's history were reviewed and updated as appropriate: allergies, current medications, past family history, past medical history, past social history, past surgical history and problem list.  Review of Systems Pertinent items noted in HPI and remainder of comprehensive ROS otherwise negative.   Objective:   Blood pressure 121/73, pulse 84, height 5' 7.5" (1.715 m), weight 213 lb 3.2 oz (96.7 kg), last menstrual period 06/15/2016. General appearance: alert and no distress Abdomen: soft, non-tender; bowel sounds normal; no masses,  no organomegaly Pelvic: external genitalia normal, rectovaginal septum normal.  Vagina without discharge.  Cervix normal appearing, no lesions and no motion tenderness.  Uterus mobile, nontender, normal shape and size.  Adnexae non-palpable, nontender bilaterally.    Assessment:   Menorrhagia with regular cycle Vaginal dryness  Plan:   1. Menorrhagia with regular cycle - discuss options with patient for heavy uterine bleeding. Options included exchanging the ParaGard IUD for a Mirena IUD, trying Depo-Provera, progesterone tablet, or considering surgical options such as endometrial ablation or hysterectomy. Patient has  tried Lysteda without improvement in symptoms. Patient desires to try a progesterone tablet. Will prescribe Aygestin 5 mg daily.   2. Vaginal dryness - patient has used samples of the Estrace cream and has noticed modest results after 6 weeks. Advised that full effects would not be evident until after 12 weeks. Patient will continue to try medication. Will attempt to resubmit to pharmacy.  To follow-up in 4-6 weeks.    [name], MD [facility]

## 2016-06-19 MED ORDER — ESTRADIOL 0.1 MG/GM VA CREA: 1.0000 | TOPICAL_CREAM | VAGINAL | 2 refills | Status: DC

## 2016-06-26 ENCOUNTER — Other Ambulatory Visit: Payer: Self-pay | Admitting: Internal Medicine

## 2016-07-04 ENCOUNTER — Other Ambulatory Visit: Payer: Self-pay | Admitting: Obstetrics and Gynecology

## 2016-07-04 ENCOUNTER — Encounter: Payer: Self-pay | Admitting: Obstetrics and Gynecology

## 2016-07-04 ENCOUNTER — Other Ambulatory Visit: Payer: Self-pay | Admitting: Orthopedic Surgery

## 2016-07-04 MED ORDER — MEDROXYPROGESTERONE ACETATE 10 MG PO TABS: 10.0000 mg | ORAL_TABLET | Freq: Every day | ORAL | 2 refills | Status: DC

## 2016-07-10 ENCOUNTER — Encounter: Payer: Self-pay | Admitting: Obstetrics and Gynecology

## 2016-07-15 ENCOUNTER — Other Ambulatory Visit: Payer: Self-pay | Admitting: Obstetrics and Gynecology

## 2016-07-15 DIAGNOSIS — N939 Abnormal uterine and vaginal bleeding, unspecified: Secondary | ICD-10-CM

## 2016-07-16 ENCOUNTER — Encounter: Payer: 59 | Admitting: Obstetrics and Gynecology

## 2016-07-16 ENCOUNTER — Ambulatory Visit (INDEPENDENT_AMBULATORY_CARE_PROVIDER_SITE_OTHER): Payer: 59

## 2016-07-16 DIAGNOSIS — N939 Abnormal uterine and vaginal bleeding, unspecified: Secondary | ICD-10-CM

## 2016-07-17 ENCOUNTER — Encounter: Payer: Self-pay | Admitting: Internal Medicine

## 2016-07-18 ENCOUNTER — Other Ambulatory Visit: Payer: Self-pay | Admitting: *Deleted

## 2016-07-18 ENCOUNTER — Encounter: Payer: Self-pay | Admitting: Hematology and Oncology

## 2016-07-18 DIAGNOSIS — E538 Deficiency of other specified B group vitamins: Secondary | ICD-10-CM

## 2016-07-19 ENCOUNTER — Inpatient Hospital Stay: Payer: 59 | Attending: Hematology and Oncology

## 2016-07-19 ENCOUNTER — Encounter: Payer: Self-pay | Admitting: Hematology and Oncology

## 2016-07-19 ENCOUNTER — Telehealth: Payer: Self-pay | Admitting: *Deleted

## 2016-07-19 DIAGNOSIS — E538 Deficiency of other specified B group vitamins: Secondary | ICD-10-CM

## 2016-07-19 DIAGNOSIS — Z79899 Other long term (current) drug therapy: Secondary | ICD-10-CM | POA: Insufficient documentation

## 2016-07-19 DIAGNOSIS — D500 Iron deficiency anemia secondary to blood loss (chronic): Secondary | ICD-10-CM | POA: Insufficient documentation

## 2016-07-19 LAB — CBC WITH DIFFERENTIAL/PLATELET
Basophils Absolute: 0.1 10*3/uL (ref 0–0.1)
Basophils Relative: 1 %
Eosinophils Absolute: 0.1 10*3/uL (ref 0–0.7)
Eosinophils Relative: 2 %
HCT: 39.1 % (ref 35.0–47.0)
Hemoglobin: 13.2 g/dL (ref 12.0–16.0)
Lymphocytes Relative: 25 %
Lymphs Abs: 1.6 10*3/uL (ref 1.0–3.6)
MCH: 27.6 pg (ref 26.0–34.0)
MCHC: 33.6 g/dL (ref 32.0–36.0)
MCV: 82.2 fL (ref 80.0–100.0)
Monocytes Absolute: 0.5 10*3/uL (ref 0.2–0.9)
Monocytes Relative: 8 %
Neutro Abs: 4.2 10*3/uL (ref 1.4–6.5)
Neutrophils Relative %: 64 %
Platelets: 254 10*3/uL (ref 150–440)
RBC: 4.76 MIL/uL (ref 3.80–5.20)
RDW: 14.6 % — ABNORMAL HIGH (ref 11.5–14.5)
WBC: 6.5 10*3/uL (ref 3.6–11.0)

## 2016-07-19 LAB — IRON AND TIBC
Iron: 71 ug/dL (ref 28–170)
Saturation Ratios: 18 % (ref 10.4–31.8)
TIBC: 401 ug/dL (ref 250–450)
UIBC: 330 ug/dL

## 2016-07-19 LAB — FERRITIN: Ferritin: 10 ng/mL — ABNORMAL LOW (ref 11–307)

## 2016-07-19 NOTE — Telephone Encounter (Signed)
Called patient and LVM to inform her that her Ferritin is low.  MD recommends Venofer weekly for 2 weeks.  Will have scheduling give her a call after we check to see if insurance covers.

## 2016-07-19 NOTE — Telephone Encounter (Signed)
-----   Message from Lequita Asal, MD sent at 07/19/2016  1:12 PM EDT ----- Regarding: Please call patient  Ferritin is low.   Can set up Venofer weekly x 2.  M  ----- Message ----- From: Interface, Lab In [place] Sent: 07/19/2016   8:17 AM To: Lequita Asal, MD

## 2016-07-23 ENCOUNTER — Encounter: Payer: Self-pay | Admitting: Obstetrics and Gynecology

## 2016-07-23 ENCOUNTER — Inpatient Hospital Stay: Payer: 59

## 2016-07-23 VITALS — BP 123/76 | HR 76 | Temp 98.0°F | Resp 20

## 2016-07-23 DIAGNOSIS — D500 Iron deficiency anemia secondary to blood loss (chronic): Secondary | ICD-10-CM

## 2016-07-23 MED ORDER — SODIUM CHLORIDE 0.9 % IV SOLN
Freq: Once | INTRAVENOUS | Status: AC
  Administered 2016-07-23: 14:00:00 via INTRAVENOUS
  Filled 2016-07-23: qty 1000

## 2016-07-23 MED ORDER — SODIUM CHLORIDE 0.9 % IV SOLN: 200.0000 mg | Freq: Once | INTRAVENOUS | Status: DC

## 2016-07-23 MED ORDER — IRON SUCROSE 20 MG/ML IV SOLN
200.0000 mg | Freq: Once | INTRAVENOUS | Status: AC
  Administered 2016-07-23: 200 mg via INTRAVENOUS
  Filled 2016-07-23: qty 10

## 2016-07-24 ENCOUNTER — Inpatient Hospital Stay: Payer: 59

## 2016-07-25 ENCOUNTER — Inpatient Hospital Stay: Payer: 59

## 2016-07-30 ENCOUNTER — Ambulatory Visit: Payer: Self-pay

## 2016-07-31 ENCOUNTER — Inpatient Hospital Stay: Payer: 59

## 2016-08-05 ENCOUNTER — Inpatient Hospital Stay: Payer: 59 | Attending: Hematology and Oncology

## 2016-08-05 VITALS — BP 118/75 | HR 73 | Resp 20

## 2016-08-05 DIAGNOSIS — D500 Iron deficiency anemia secondary to blood loss (chronic): Secondary | ICD-10-CM

## 2016-08-05 DIAGNOSIS — Z79899 Other long term (current) drug therapy: Secondary | ICD-10-CM | POA: Insufficient documentation

## 2016-08-05 MED ORDER — SODIUM CHLORIDE 0.9 % IV SOLN: 200.0000 mg | Freq: Once | INTRAVENOUS | Status: DC

## 2016-08-05 MED ORDER — IRON SUCROSE 20 MG/ML IV SOLN
200.0000 mg | Freq: Once | INTRAVENOUS | Status: AC
  Administered 2016-08-05: 200 mg via INTRAVENOUS
  Filled 2016-08-05: qty 10

## 2016-08-05 MED ORDER — SODIUM CHLORIDE 0.9 % IV SOLN
Freq: Once | INTRAVENOUS | Status: AC
  Administered 2016-08-05: 12:00:00 via INTRAVENOUS
  Filled 2016-08-05: qty 1000

## 2016-08-12 ENCOUNTER — Encounter: Payer: Self-pay | Admitting: Internal Medicine

## 2016-08-12 DIAGNOSIS — F439 Reaction to severe stress, unspecified: Secondary | ICD-10-CM

## 2016-08-13 NOTE — Telephone Encounter (Signed)
Order placed for psychiatry referral.   °

## 2016-08-14 ENCOUNTER — Ambulatory Visit (INDEPENDENT_AMBULATORY_CARE_PROVIDER_SITE_OTHER): Payer: 59 | Admitting: Obstetrics and Gynecology

## 2016-08-14 ENCOUNTER — Encounter: Payer: Self-pay | Admitting: Obstetrics and Gynecology

## 2016-08-14 VITALS — BP 141/79 | HR 100 | Ht 67.5 in | Wt 213.4 lb

## 2016-08-14 DIAGNOSIS — N924 Excessive bleeding in the premenopausal period: Secondary | ICD-10-CM | POA: Diagnosis not present

## 2016-08-14 NOTE — Progress Notes (Signed)
GYNECOLOGY PROGRESS NOTE  Subjective:    Patient ID: Lori Le, female    DOB: 10/02/1965, 51 y.o.   MRN: [id number]  HPI  Patient is a 51 y.o. G0P0000 female who presents for discussion of further management of perimenopausal menorrhagia and discussion of ultrasound results. Bleeding has increased, requiring use of super tampons  Patient notes that she has been taking the Provera as prescribed.  Notes bleeding stopped for 4 days, however began again.  Took a second round of Provera tablets and now the bleeding has stopped again.  Has been 3 days since last episode. Notes that she desires to discuss other management options.   The following portions of the patient's history were reviewed and updated as appropriate: allergies, current medications, past family history, past medical history, past social history, past surgical history and problem list.  Review of Systems Pertinent items noted in HPI and remainder of comprehensive ROS otherwise negative.   Objective:   Blood pressure (!) 141/79, pulse 100, height 5' 7.5" (1.715 m), weight 213 lb 6.4 oz (96.8 kg), last menstrual period 08/08/2016. General appearance: alert and no distress Abdomen: soft, non-tender; bowel sounds normal; no masses,  no organomegaly Pelvis: external genitalia normal, rectovaginal septum normal.  Vagina without discharge.  Cervix normal appearing, no lesions and no motion tenderness.  IUD strings visible. Uterus mobile, nontender, normal shape and size.  Adnexae non-palpable, nontender bilaterally.      Imaging:  ULTRASOUND REPORT  Location: [facility] Date of Service: 07/16/16   Indications:AUB Findings:  The uterus measures 6.2 x 2.8 x 3.8 cm. Echo texture is homogenous without evidence of focal masses.  The Endometrium measures 3.6 mm.  IUD is seen within the fundal portion of the endometrium.  Right Ovary measures 3.1 x 1.9 x 2.2 cm. It is normal in appearance. Left Ovary  measures 2.9 x 2.1 x 2.2 cm. It is normal appearance. Survey of the adnexa demonstrates no adnexal masses. There is no free fluid in the cul de sac.  Impression: 1. WNL  Recommendations: 1.Clinical correlation with the patient's History and Physical Exam.   Tyrone Apple  Assessment:   Perimenopausal menorrhagia  Plan:   - Discussed management options for abnormal uterine bleeding including Depo Provera, Mirena IUD, endometrial ablation (Novasure/Hydrothermal Ablation) or hysterectomy as definitive surgical management.  Patient has already tried and failed Lysteda and oral progesterone.  Discussed risks and benefits of each method.   Patient desires endometrial ablation with NovaSure.  Printed patient education handouts were given to the patient to review at home. Bleeding precautions reviewed. Endometral biopsy performed today (see procedure note below).  Reviewed patient's ultrasound results again (had originally contacted patient by phone with results).   - Patient desires surgical management with Hysteroscopy D&C with Novasure Ablation.  The risks of surgery were discussed in detail with the patient including but not limited to: bleeding which may require transfusion or reoperation; infection which may require prolonged hospitalization or re-hospitalization and antibiotic therapy; injury to bowel, bladder, ureters and major vessels or other surrounding organs; need for additional procedures including laparotomy; thromboembolic phenomenon, incisional problems and other postoperative or anesthesia complications.  Patient was told that the likelihood that her condition and symptoms will be treated effectively with this surgical management was very high; the postoperative expectations were also discussed in detail.  All questions were answered.  She was told that she will be contacted by our surgical scheduler regarding the time and date of her  surgery; routine preoperative instructions of  having nothing to eat or drink after midnight on the day prior to surgery and also coming to the hospital 1.5 hours prior to her time of surgery were also emphasized.  She was told she may be called for a preoperative appointment later this week prior to surgery and will be given further preoperative instructions at that visit.      Endometrial Biopsy Procedure Note  The patient is positioned on the exam table in the dorsal lithotomy position. Bimanual exam confirms uterine position and size. A Graves speculum is placed into the vagina. A single toothed tenaculum is placed onto the anterior lip of the cervix. The pipette is placed into the endocervical canal and is advanced to the uterine fundus. Using a piston like technique, with vacuum created by withdrawing the stylus, the endometrial specimen is obtained and transferred to the biopsy container. Minimal bleeding is encountered. The procedure is well tolerated.   Uterine Position: mid    Uterine Length: 6 cm   Uterine Specimen: Scant   Post procedure instructions are given.     A total of 15 minutes were spent face-to-face with the patient during this encounter and over half of that time dealt with counseling and coordination of care.    [name], MD [facility]

## 2016-08-15 ENCOUNTER — Inpatient Hospital Stay: Admission: RE | Admit: 2016-08-15 | Payer: Self-pay | Source: Ambulatory Visit

## 2016-08-16 ENCOUNTER — Encounter
Admission: RE | Admit: 2016-08-16 | Discharge: 2016-08-16 | Disposition: A | Discharge status: Home / Self Care | Payer: 59 | Source: Ambulatory Visit | Attending: Obstetrics and Gynecology | Admitting: Obstetrics and Gynecology

## 2016-08-16 DIAGNOSIS — Z79899 Other long term (current) drug therapy: Secondary | ICD-10-CM | POA: Diagnosis not present

## 2016-08-16 DIAGNOSIS — Z30432 Encounter for removal of intrauterine contraceptive device: Secondary | ICD-10-CM | POA: Diagnosis not present

## 2016-08-16 DIAGNOSIS — K219 Gastro-esophageal reflux disease without esophagitis: Secondary | ICD-10-CM | POA: Diagnosis not present

## 2016-08-16 DIAGNOSIS — F329 Major depressive disorder, single episode, unspecified: Secondary | ICD-10-CM | POA: Diagnosis not present

## 2016-08-16 DIAGNOSIS — Z9884 Bariatric surgery status: Secondary | ICD-10-CM | POA: Diagnosis not present

## 2016-08-16 DIAGNOSIS — E039 Hypothyroidism, unspecified: Secondary | ICD-10-CM | POA: Diagnosis not present

## 2016-08-16 DIAGNOSIS — I10 Essential (primary) hypertension: Secondary | ICD-10-CM | POA: Diagnosis not present

## 2016-08-16 DIAGNOSIS — N924 Excessive bleeding in the premenopausal period: Secondary | ICD-10-CM | POA: Diagnosis present

## 2016-08-16 DIAGNOSIS — Z79890 Hormone replacement therapy (postmenopausal): Secondary | ICD-10-CM | POA: Diagnosis not present

## 2016-08-16 LAB — POTASSIUM: Potassium: 3.2 mmol/L — ABNORMAL LOW (ref 3.5–5.1)

## 2016-08-16 LAB — PATHOLOGY

## 2016-08-16 NOTE — H&P (Signed)
GYNECOLOGY PRE-OPERATIVE HISTORY AND PHYSICAL  Subjective:    Patient is a 51 y.o. G0P0000 female scheduled for IUD removal, Hysteroscopy D&C and Novasure Ablation. Indications for procedure are abnormal perimnopausal bleeding.   Pertinent Gynecological History: Menses: flow is moderate to heavy, with passage of large clots, lasting from 6-10 days.  Bleeding: dysfunctional uterine bleeding Contraception: IUD (ParaGard) Last mammogram: normal Date: 05/15/2016 Last pap: normal Date: 08/14/2016  Discussed Blood/Blood Products: no   Menstrual History: OB History    Gravida Para Term Preterm AB Living   0 0 0 0 0 0   SAB TAB Ectopic Multiple Live Births   0 0 0 0 0      Menarche age: 39 Patient's last menstrual period was 08/08/2016.    Past Medical History:  Diagnosis Date  . Allergic rhinitis due to allergen   . Allergy   . Anemia   . Arthritis   . Cancer ([place])    skin ca  . Chronic idiopathic urticaria   . Complication of anesthesia    nausea, slow to wake up  . Depression   . GERD (gastroesophageal reflux disease)   . Gout   . Gout   . Hashimoto's thyroiditis   . Heart murmur   . History of eating disorder   . Hives   . Hyperlipidemia   . Hypertension   . Hypothyroidism   . Lower extremity edema   . Melanoma in situ ([place])    left shoulder  . Migraine   . Mitral valve disorder   . Motion sickness    all moving vehicles  . PCOS (polycystic ovarian syndrome)   . PONV (postoperative nausea and vomiting)   . Thyroid disease   . Ulcer   . Vertigo    last episode over 1 yr ago    Past Surgical History:  Procedure Laterality Date  . BREAST BIOPSY Right    neg- bx/clip  . CHOLECYSTECTOMY  2003  . ESOPHAGOGASTRODUODENOSCOPY (EGD) WITH PROPOFOL N/A 12/05/2015   Procedure: ESOPHAGOGASTRODUODENOSCOPY (EGD) WITH PROPOFOL;  Surgeon: Lollie Sails, MD;  Location: [facility] ENDOSCOPY;  Service: Endoscopy;  Laterality: N/A;  . ESOPHAGOGASTRODUODENOSCOPY (EGD) WITH  PROPOFOL N/A 02/06/2016   Procedure: ESOPHAGOGASTRODUODENOSCOPY (EGD) WITH PROPOFOL;  Surgeon: Lollie Sails, MD;  Location[facility] END[facility]COPY;  Service: Endoscopy;  Laterality: N/A;  . FOOT SURGERY  1994  . GASTRIC BYPASS  2011  . JOINT REPLACEMENT Left 09/27/2014   HIP  . JOINT REPLACEMENT Left 12/01/2014   KNEE  . KNEE ARTHROSCOPY Right   . KNEE ARTHROSCOPY Left 03/23/2015   Procedure: ARTHROSCOPY KNEE, partial synovectomy;  Surgeon: Hessie Knows, MD;  Location: [facility] ORS;  Service: Orthopedics;  Laterality: Left;  . LAPAROSCOPIC GASTRIC BANDING  2010  . ROTATOR CUFF REPAIR Bilateral    right shoulder 07-26-2016  . SHOULDER ARTHROSCOPY Left 06/20/2015   Procedure: ARTHROSCOPY SHOULDER, REPAIR OF MASSIVE ROTATOR CUFF TEAR, TENODESIS, DECOMPRESSION DEBRIDEMENT;  Surgeon: Corky Mull, MD;  Location: [facility] ORS;  Service: Orthopedics;  Laterality: Left;  . TOTAL HIP ARTHROPLASTY Left 09/27/2014   Procedure: TOTAL HIP ARTHROPLASTY ANTERIOR APPROACH;  Surgeon: Hessie Knows, MD;  Location: [facility] ORS;  Service: Orthopedics;  Laterality: Left;  . TOTAL KNEE ARTHROPLASTY Left 12/01/2014   Procedure: TOTAL KNEE ARTHROPLASTY;  Surgeon: Hessie Knows, MD;  Location: [facility] ORS;  Service: Orthopedics;  Laterality: Left;     Social History   Social History  . Marital status: Legally Separated    Spouse name: N/A  .  Number of children: N/A  . Years of education: N/A   Social History Main Topics  . Smoking status: Never Smoker  . Smokeless tobacco: Never Used  . Alcohol use 0.0 - 0.6 oz/week     Comment: occ.  . Drug use: No  . Sexual activity: No     Comment: Paragard   Other Topics Concern  . None   Social History Narrative  . None    Family History  Problem Relation Age of Onset  . Arthritis Mother   . Hyperlipidemia Mother   . Hypertension Mother   . Diabetes Mother   . Arthritis Father   . Hyperlipidemia Father   . Hypertension Father   . Mental illness Father   . Diabetes Father    . Arthritis Maternal Grandmother   . Cancer Maternal Grandmother        breast cancer  . Hyperlipidemia Maternal Grandmother   . Hypertension Maternal Grandmother   . Breast cancer Maternal Grandmother   . Arthritis Maternal Grandfather   . Hyperlipidemia Maternal Grandfather   . Hypertension Maternal Grandfather   . Heart disease Maternal Grandfather        heart attack  . Breast cancer Paternal Grandmother      Current Outpatient Prescriptions on File Prior to Visit  Medication Sig Dispense Refill  . buPROPion (WELLBUTRIN XL) 300 MG 24 hr tablet TAKE 1 TABLET (300 MG TOTAL) BY MOUTH DAILY. 30 tablet 2  . furosemide (LASIX) 40 MG tablet Take 1 tablet (40 mg total) by mouth daily. 90 tablet 1  . KLOR-CON 10 10 MEQ tablet TAKE ONE TABLET TWICE FOR THREE DAYS AND THEN DAILY (Patient taking differently: TAKE ONE TABLET DAILY) 30 tablet 2  . levothyroxine (SYNTHROID, LEVOTHROID) 137 MCG tablet TAKE ONE TABLET BY MOUTH ONCE DAILY BEFORE BREAKFAST (Patient taking differently: Take 137 mcg by mouth daily before breakfast. DAILY BEFORE BREAKFAST) 90 tablet 0  . medroxyPROGESTERone (PROVERA) 10 MG tablet Take 1 tablet (10 mg total) by mouth daily. Use for ten days 10 tablet 2  . pantoprazole (PROTONIX) 40 MG tablet Take 40 mg by mouth daily.     [name] triamcinolone cream (KENALOG) 0.1 % Apply 1 application topically 2 (two) times daily. (Patient not taking: Reported on 08/15/2016) 30 g 0  . triamterene-hydrochlorothiazide (MAXZIDE-25) 37.5-25 MG tablet Take 1 tablet by mouth daily. 90 tablet 1  . estradiol (ESTRACE VAGINAL) 0.1 MG/GM vaginal cream Place 1 Applicatorful vaginally 3 (three) times a week. (Patient not taking: Reported on 08/14/2016) 42.5 g 2   No current facility-administered medications on file prior to visit.      Allergies  Allergen Reactions  . Influenza Vaccines Hives  . Lactose Nausea And Vomiting    Can have yogurt Can not have milk and ice cream   . Lactose Intolerance  (Gi) Nausea And Vomiting    Can have yogurt Can not have milk and ice cream    . Bacitracin-Neomycin-Polymyxin Rash  . Neosporin + Pain Relief Max St [Neomy-Bacit-Polymyx-Pramoxine] Rash    Review of Systems Constitutional: No recent fever/chills/sweats Respiratory: No recent cough/bronchitis Cardiovascular: No chest pain Gastrointestinal: No recent nausea/vomiting/diarrhea Genitourinary: No UTI symptoms Hematologic/lymphatic:No history of coagulopathy or recent blood thinner use    Objective:    BP (!) 141/79 (BP Location: Left Arm, Patient Position: Sitting, Cuff Size: Large)   Pulse 100   Ht 5' 7.5" (1.715 m)   Wt 213 lb 6.4 oz (96.8 kg)   LMP 08/08/2016  BMI 32.93 kg/m   General:   Normal  Skin:   normal  HEENT:  Normal  Neck:  Supple without Adenopathy or Thyromegaly  Lungs:   Heart:              Breasts:   Abdomen:  Pelvis:  M/S   Extremeties:  Neuro:    clear to auscultation bilaterally   Normal without murmur   Not Examined   soft, non-tender; bowel sounds normal; no masses,  no organomegaly   Exam deferred to OR  No CVAT  Warm/Dry   Normal           Labs:  Lab Results  Component Value Date   WBC 6.5 07/19/2016   HGB 13.2 07/19/2016   HCT 39.1 07/19/2016   MCV 82.2 07/19/2016   PLT 254 07/19/2016    Lab Results  Component Value Date   TSH 1.40 02/28/2015     Pathology (08/14/2016):  Diagnosis:  ENDOMETRIUM, BIOPSY:  PROLIFERATIVE PHASE ENDOMETRIUM, NEGATIVE FOR HYPERPLASIA AND/OR  MALIGNANCY. FRAGMENTS OF BENIGN ENDOCERVICAL TISSUE.    Imaging:  ULTRASOUND REPORT  Location: [facility] Date of Service: 07/16/16   Indications:AUB Findings:  The uterus measures 6.2 x 2.8 x 3.8 cm. Echo texture is homogenous without evidence of focal masses.  The Endometrium measures 3.6 mm.  IUD is seen within the fundal portion of the endometrium.  Right Ovary measures 3.1 x 1.9 x 2.2 cm. It is normal in appearance. Left  Ovary measures 2.9 x 2.1 x 2.2 cm. It is normal appearance. Survey of the adnexa demonstrates no adnexal masses. There is no free fluid in the cul de sac.  Impression: 1. WNL  Recommendations: 1.Clinical correlation with the patient's History and Physical Exam.   Tyrone Apple  Assessment:     Perimenopausal menorrhagia ParaGard IUD in place   Plan:    Counseling: Procedure, risks, reasons, benefits and complications (including injury to bowel, bladder, major blood vessel, ureter, bleeding, possibility of transfusion, infection, or fistula formation) reviewed in detail.  Consents to be signed at pre-admission.  Preop testing ordered. Instructions reviewed, including NPO after midnight.   [name], MD [facility]

## 2016-08-16 NOTE — Patient Instructions (Signed)
Your procedure is scheduled on: Monday Aug 19, 2016 Su procedimiento est programado para: Report to [facility] RE[facility] DOOR Presntese a: To find out your arrival time please call [phone] between 1PM - 3PM on Friday Aug 16, 2016  Para saber su hora de llegada por favor llame al ([place]:  Remember: Instructions that are not followed completely may result in serious medical risk, up to and including death, or upon the discretion of your surgeon and anesthesiologist your surgery may need to be rescheduled.  Recuerde: Las instrucciones que no se siguen completamente Heritage manager en un riesgo de salud grave, incluyendo hasta la [place] o a discrecin de su cirujano y Environmental health practitioner, su ciruga se puede posponer.   __X__ 1. Do not eat food or drink liquids after midnight. No gum chewing or hard candies.  No coma alimentos ni tome lquidos despus de la medianoche.  No mastique chicle ni caramelos  duros.     __X__ 2. No alcohol for 24 hours before or after surgery.    No tome alcohol durante las 24 horas antes ni despus de la [place].   __X__ 3. Bring NEW medications with you on the day of surgery if instructed.    Lleve todos los medicamentos con usted el da de su ciruga si se le ha indicado as.   __X__ 4. Notify your doctor if there is any change in your medical condition (cold, fever,                             infections).    Informe a su mdico si hay algn cambio en su condicin mdica (resfriado, fiebre, infecciones).   Do not wear jewelry, make-up, hairpins, clips or nail polish.  No use joyas, maquillajes, pinzas/ganchos para el cabello ni esmalte de uas.  Do not wear lotions, powders, or perfumes. You may NOT wear deodorant.  No use lociones, polvos o perfumes.  Puede usar desodorante.    Do not shave 48 hours prior to surgery. Men may shave face and neck.  No se afeite 48 horas antes de la [place].  Los  hombres pueden [employer] cara y el cuello.   Do not bring valuables to the hospital.   No lleve objetos [facility] is not responsible for any belongings or valuables.  [facility] no se hace responsable de ningn tipo de pertenencias u objetos de Geographical information systems officer.               Contacts, dentures or bridgework may not be worn into surgery.  Los lentes de [place], las dentaduras postizas o puentes no se pueden usar en la [place].  Leave your suitcase in the car. After surgery it may be brought to your room.  Deje su maleta en el auto.  Despus de la ciruga podr traerla a su habitacin.  For patients admitted to the hospital, discharge time is determined by your treatment team.  Para los pacientes que sean ingresados al hospital, el tiempo en el cual se le dar de alta es determinado por su                equipo de [place].   Patients discharged the day of surgery will not be allowed to drive home. A los pacientes que se les da de alta el mismo da de la ciruga no se les permitir  conducir a casa.   Please read over the following fact sheets that you were given: Por favor [place] informacin que le dieron:      __X_ Take these medicines the morning of surgery with A SIP OF WATER:          [employer] medicinas la maana de la ciruga con UN SORBO DE AGUA:  1.  WELLBUTRIN  2.  PROTONIX- DOSE NIGHT BEFORE SURGERY AND MORNING OF SURGERY  3.  SYNTHROID   4.       5.  6.  ____ Fleet Enema (as directed)          Enema de Fleet (segn lo indicado)    ____ Use CHG Soap as directed          Utilice el jabn de CHG segn lo indicado  ____ Use inhalers on the day of surgery          Use los inhaladores el da de la ciruga  ____ Stop metformin 2 days prior to surgery          Deje de tomar el metformin 2 das antes de la ciruga    ____ Take 1/2 of usual insulin dose the night before surgery and none on the morning of surgery           Tome la  mitad de la dosis habitual de insulina la noche antes de la [place] y no tome nada en la maana de la             ciruga  ____ Stop Coumadin/Plavix/aspirin on           Deje de tomar el Coumadin/Plavix/aspirina el da:  __X__ Stop Anti-inflammatories on IBUPROFEN, ADVIL, ANAPROX, MOTRIN ALEVE, GOODY'S POWDER          Deje de tomar antiinflamatorios el da:   ____ Stop supplements until after surgery            Deje de tomar suplementos hasta despus de la ciruga  ____ Bring C-Pap to the hospital          [place] al hospital

## 2016-08-16 NOTE — OR Nursing (Signed)
Abnormal potassium results faxed to Dr Andreas Blower office. Office by phone of abnormal result.

## 2016-08-19 ENCOUNTER — Encounter: Payer: Self-pay | Admitting: *Deleted

## 2016-08-19 ENCOUNTER — Ambulatory Visit
Admission: RE | Admit: 2016-08-19 | Discharge: 2016-08-19 | Disposition: A | Discharge status: Home / Self Care | Payer: 59 | Source: Ambulatory Visit | Attending: Obstetrics and Gynecology | Admitting: Obstetrics and Gynecology

## 2016-08-19 ENCOUNTER — Encounter
Admission: RE | Disposition: A | Discharge status: Home / Self Care | Payer: Self-pay | Source: Ambulatory Visit | Attending: Obstetrics and Gynecology

## 2016-08-19 ENCOUNTER — Ambulatory Visit: Payer: 59 | Admitting: Certified Registered Nurse Anesthetist

## 2016-08-19 DIAGNOSIS — Z975 Presence of (intrauterine) contraceptive device: Secondary | ICD-10-CM | POA: Diagnosis not present

## 2016-08-19 DIAGNOSIS — Z9884 Bariatric surgery status: Secondary | ICD-10-CM | POA: Insufficient documentation

## 2016-08-19 DIAGNOSIS — N924 Excessive bleeding in the premenopausal period: Secondary | ICD-10-CM | POA: Insufficient documentation

## 2016-08-19 DIAGNOSIS — E039 Hypothyroidism, unspecified: Secondary | ICD-10-CM | POA: Insufficient documentation

## 2016-08-19 DIAGNOSIS — Z79890 Hormone replacement therapy (postmenopausal): Secondary | ICD-10-CM | POA: Insufficient documentation

## 2016-08-19 DIAGNOSIS — I10 Essential (primary) hypertension: Secondary | ICD-10-CM | POA: Insufficient documentation

## 2016-08-19 DIAGNOSIS — K219 Gastro-esophageal reflux disease without esophagitis: Secondary | ICD-10-CM | POA: Insufficient documentation

## 2016-08-19 DIAGNOSIS — Z79899 Other long term (current) drug therapy: Secondary | ICD-10-CM | POA: Insufficient documentation

## 2016-08-19 DIAGNOSIS — Z30432 Encounter for removal of intrauterine contraceptive device: Secondary | ICD-10-CM | POA: Insufficient documentation

## 2016-08-19 DIAGNOSIS — F329 Major depressive disorder, single episode, unspecified: Secondary | ICD-10-CM | POA: Insufficient documentation

## 2016-08-19 HISTORY — PX: DILITATION & CURRETTAGE/HYSTROSCOPY WITH NOVASURE ABLATION: SHX5568

## 2016-08-19 LAB — CBC
HEMATOCRIT: 38.2 % (ref 35.0–47.0)
HEMOGLOBIN: 12.6 g/dL (ref 12.0–16.0)
MCH: 28.3 pg (ref 26.0–34.0)
MCHC: 33 g/dL (ref 32.0–36.0)
MCV: 85.6 fL (ref 80.0–100.0)
Platelets: 231 10*3/uL (ref 150–440)
RBC: 4.46 MIL/uL (ref 3.80–5.20)
RDW: 16 % — ABNORMAL HIGH (ref 11.5–14.5)
WBC: 6.4 10*3/uL (ref 3.6–11.0)

## 2016-08-19 LAB — POCT I-STAT 4, (NA,K, GLUC, HGB,HCT)
Glucose, Bld: 84 mg/dL (ref 65–99)
HEMATOCRIT: 37 % (ref 36.0–46.0)
HEMOGLOBIN: 12.6 g/dL (ref 12.0–15.0)
Potassium: 3.3 mmol/L — ABNORMAL LOW (ref 3.5–5.1)
SODIUM: 138 mmol/L (ref 135–145)

## 2016-08-19 LAB — POCT PREGNANCY, URINE: Preg Test, Ur: NEGATIVE

## 2016-08-19 SURGERY — DILATATION & CURETTAGE/HYSTEROSCOPY WITH NOVASURE ABLATION
Anesthesia: General | Wound class: Clean Contaminated

## 2016-08-19 MED ORDER — FENTANYL CITRATE (PF) 100 MCG/2ML IJ SOLN
INTRAMUSCULAR | Status: AC
  Filled 2016-08-19: qty 2

## 2016-08-19 MED ORDER — SCOPOLAMINE 1 MG/3DAYS TD PT72
1.0000 | MEDICATED_PATCH | TRANSDERMAL | Status: DC
  Administered 2016-08-19: 1.5 mg via TRANSDERMAL

## 2016-08-19 MED ORDER — LACTATED RINGERS IV SOLN: INTRAVENOUS | Status: DC

## 2016-08-19 MED ORDER — PROPOFOL 10 MG/ML IV BOLUS
INTRAVENOUS | Status: AC
  Filled 2016-08-19: qty 20

## 2016-08-19 MED ORDER — ONDANSETRON HCL 4 MG/2ML IJ SOLN: 4.0000 mg | Freq: Once | INTRAMUSCULAR | Status: DC | PRN

## 2016-08-19 MED ORDER — FAMOTIDINE 20 MG PO TABS
ORAL_TABLET | ORAL | Status: AC
  Administered 2016-08-19: 20 mg via ORAL
  Filled 2016-08-19: qty 1

## 2016-08-19 MED ORDER — MIDAZOLAM HCL 2 MG/2ML IJ SOLN
INTRAMUSCULAR | Status: AC
  Filled 2016-08-19: qty 2

## 2016-08-19 MED ORDER — IBUPROFEN 800 MG PO TABS: 800.0000 mg | ORAL_TABLET | Freq: Three times a day (TID) | ORAL | 1 refills | Status: DC | PRN

## 2016-08-19 MED ORDER — ONDANSETRON HCL 4 MG/2ML IJ SOLN
INTRAMUSCULAR | Status: DC | PRN
  Administered 2016-08-19: 4 mg via INTRAVENOUS

## 2016-08-19 MED ORDER — KETOROLAC TROMETHAMINE 30 MG/ML IJ SOLN
INTRAMUSCULAR | Status: AC
  Filled 2016-08-19: qty 1

## 2016-08-19 MED ORDER — SUGAMMADEX SODIUM 200 MG/2ML IV SOLN
INTRAVENOUS | Status: AC
  Filled 2016-08-19: qty 2

## 2016-08-19 MED ORDER — LIDOCAINE HCL (CARDIAC) 20 MG/ML IV SOLN
INTRAVENOUS | Status: DC | PRN
  Administered 2016-08-19: 40 mg via INTRAVENOUS

## 2016-08-19 MED ORDER — FAMOTIDINE 20 MG PO TABS
20.0000 mg | ORAL_TABLET | Freq: Once | ORAL | Status: AC
  Administered 2016-08-19: 20 mg via ORAL

## 2016-08-19 MED ORDER — ONDANSETRON HCL 4 MG/2ML IJ SOLN
INTRAMUSCULAR | Status: AC
  Filled 2016-08-19: qty 2

## 2016-08-19 MED ORDER — MIDAZOLAM HCL 2 MG/2ML IJ SOLN
INTRAMUSCULAR | Status: DC | PRN
  Administered 2016-08-19: 2 mg via INTRAVENOUS

## 2016-08-19 MED ORDER — SCOPOLAMINE 1 MG/3DAYS TD PT72
MEDICATED_PATCH | TRANSDERMAL | Status: AC
  Administered 2016-08-19: 1.5 mg via TRANSDERMAL
  Filled 2016-08-19: qty 1

## 2016-08-19 MED ORDER — PROPOFOL 10 MG/ML IV BOLUS
INTRAVENOUS | Status: DC | PRN
  Administered 2016-08-19: 150 mg via INTRAVENOUS

## 2016-08-19 MED ORDER — HYDROCODONE-ACETAMINOPHEN 5-325 MG PO TABS: 1.0000 | ORAL_TABLET | Freq: Four times a day (QID) | ORAL | 0 refills | Status: DC | PRN

## 2016-08-19 MED ORDER — GLYCOPYRROLATE 0.2 MG/ML IJ SOLN
INTRAMUSCULAR | Status: DC | PRN
  Administered 2016-08-19: 0.2 mg via INTRAVENOUS

## 2016-08-19 MED ORDER — LACTATED RINGERS IV SOLN
INTRAVENOUS | Status: DC
  Administered 2016-08-19: 12:00:00 via INTRAVENOUS

## 2016-08-19 MED ORDER — GLYCOPYRROLATE 0.2 MG/ML IJ SOLN
INTRAMUSCULAR | Status: AC
  Filled 2016-08-19: qty 1

## 2016-08-19 MED ORDER — DEXAMETHASONE SODIUM PHOSPHATE 10 MG/ML IJ SOLN
INTRAMUSCULAR | Status: DC | PRN
  Administered 2016-08-19: 5 mg via INTRAVENOUS

## 2016-08-19 MED ORDER — DEXAMETHASONE SODIUM PHOSPHATE 10 MG/ML IJ SOLN
INTRAMUSCULAR | Status: AC
  Filled 2016-08-19: qty 1

## 2016-08-19 MED ORDER — FENTANYL CITRATE (PF) 100 MCG/2ML IJ SOLN: 25.0000 ug | INTRAMUSCULAR | Status: DC | PRN

## 2016-08-19 MED ORDER — DEXAMETHASONE SODIUM PHOSPHATE 10 MG/ML IJ SOLN
INTRAMUSCULAR | Status: AC
Start: 2016-08-19 — End: 2016-08-19
  Filled 2016-08-19: qty 1

## 2016-08-19 MED ORDER — FENTANYL CITRATE (PF) 100 MCG/2ML IJ SOLN
INTRAMUSCULAR | Status: DC | PRN
  Administered 2016-08-19 (×2): 50 ug via INTRAVENOUS

## 2016-08-19 SURGICAL SUPPLY — 19 items
CANISTER SUCT 1200ML W/VALVE (MISCELLANEOUS) ×3 IMPLANT
CATH ROBINSON RED A/P 16FR (CATHETERS) ×3 IMPLANT
DRSG TELFA 3X8 NADH (GAUZE/BANDAGES/DRESSINGS) ×3 IMPLANT
GLOVE BIO SURGEON STRL SZ 6.5 (GLOVE) ×2 IMPLANT
GLOVE BIO SURGEONS STRL SZ 6.5 (GLOVE) ×1
GLOVE INDICATOR 7.0 STRL GRN (GLOVE) ×3 IMPLANT
GOWN STRL REUS W/ TWL LRG LVL3 (GOWN DISPOSABLE) ×2 IMPLANT
GOWN STRL REUS W/TWL LRG LVL3 (GOWN DISPOSABLE) ×6
IV LACTATED RINGERS 1000ML (IV SOLUTION) ×3 IMPLANT
KIT RM TURNOVER CYSTO AR (KITS) ×3 IMPLANT
NOVASURE ENDOMETRIAL ABLATION (MISCELLANEOUS) ×3 IMPLANT
NS IRRIG 500ML POUR BTL (IV SOLUTION) ×3 IMPLANT
PACK DNC HYST (MISCELLANEOUS) ×3 IMPLANT
PAD DRESSING TELFA 3X8 NADH (GAUZE/BANDAGES/DRESSINGS) ×1 IMPLANT
PAD OB MATERNITY 4.3X12.25 (PERSONAL CARE ITEMS) ×3 IMPLANT
SPONGE XRAY 4X4 16PLY STRL (MISCELLANEOUS) ×3 IMPLANT
TOWEL OR 17X26 4PK STRL BLUE (TOWEL DISPOSABLE) ×3 IMPLANT
TUBING CONNECTING 10 (TUBING) ×2 IMPLANT
TUBING CONNECTING 10' (TUBING) ×1

## 2016-08-19 NOTE — Anesthesia Postprocedure Evaluation (Signed)
Anesthesia Post Note  Patient: Lori Le  Procedure(s) Performed: Procedure(s) (LRB): DILATATION & CURETTAGE/HYSTEROSCOPY WITH NOVASURE ABLATION (N/A)  Patient location during evaluation: PACU Anesthesia Type: General Level of consciousness: awake and alert Pain management: pain level controlled Vital Signs Assessment: post-procedure vital signs reviewed and stable Respiratory status: spontaneous breathing, nonlabored ventilation, respiratory function stable and patient connected to nasal cannula oxygen Cardiovascular status: blood pressure returned to baseline and stable Postop Assessment: no signs of nausea or vomiting Anesthetic complications: no     Last Vitals:  Vitals:   08/19/16 1434 08/19/16 1506  BP: 133/67 124/67  Pulse: 81 78  Resp: 16 16  Temp: 36.2 C     Last Pain:  Vitals:   08/19/16 1434  TempSrc: Temporal                 Phung Kotas S

## 2016-08-19 NOTE — Anesthesia Preprocedure Evaluation (Signed)
Anesthesia Evaluation  Patient identified by MRN, date of birth, ID band Patient awake    Reviewed: Allergy & Precautions, NPO status , Patient's Chart, lab work & pertinent test results, reviewed documented beta blocker date and time   History of Anesthesia Complications (+) PONV and history of anesthetic complications  Airway Mallampati: III  TM Distance: >3 FB     Dental  (+) Chipped   Pulmonary           Cardiovascular hypertension, Pt. on medications      Neuro/Psych  Headaches, PSYCHIATRIC DISORDERS Depression    GI/Hepatic GERD  ,  Endo/Other  Hypothyroidism   Renal/GU      Musculoskeletal  (+) Arthritis ,   Abdominal   Peds  Hematology  (+) anemia ,   Anesthesia Other Findings Gout.PCOS.Motion sickness.. Gastric bypass.  Reproductive/Obstetrics                             Anesthesia Physical Anesthesia Plan  ASA: III  Anesthesia Plan: General   Post-op Pain Management:    Induction: Intravenous  Airway Management Planned: LMA  Additional Equipment:   Intra-op Plan:   Post-operative Plan:   Informed Consent: I have reviewed the patients History and Physical, chart, labs and discussed the procedure including the risks, benefits and alternatives for the proposed anesthesia with the patient or authorized representative who has indicated his/her understanding and acceptance.     Plan Discussed with: CRNA  Anesthesia Plan Comments:         Anesthesia Quick Evaluation

## 2016-08-19 NOTE — Discharge Instructions (Signed)
General Gynecological Post-Operative Instructions You may expect to feel dizzy, weak, and drowsy for as long as 24 hours after receiving the medicine that made you sleep (anesthetic).  Do not drive a car, ride a bicycle, participate in physical activities, or take public transportation until you are done taking narcotic pain medicines or as directed by your doctor.  Do not drink alcohol or take tranquilizers.  Do not take medicine that has not been prescribed by your doctor.  Do not sign important papers or make important decisions while on narcotic pain medicines.  Have a responsible person with you.  CARE OF INCISION  Keep incision clean and dry. Take showers instead of baths until your doctor gives you permission to take baths.  Avoid heavy lifting (more than 10 pounds/4.5 kilograms), pushing, or pulling.  Avoid activities that may risk injury to your surgical site.  No sexual intercourse or placement of anything in the vagina for 2-3 weeks or as instructed by your doctor. If you have tubes coming from the wound site, check with your doctor regarding appropriate care of the tubes. Only take prescription or over-the-counter medicines  for pain, discomfort, or fever as directed by your doctor. Do not take aspirin. It can make you bleed. Take medicines (antibiotics) that kill germs if they are prescribed for you.  Call the office or go to the MAU if:  You feel sick to your stomach (nauseous).  You start to throw up (vomit).  You have trouble eating or drinking.  You have an oral temperature above 101.  You have constipation that is not helped by adjusting diet or increasing fluid intake. Pain medicines are a common cause of constipation.  You have any other concerns. SEEK IMMEDIATE MEDICAL CARE IF:  You have persistent dizziness.  You have difficulty breathing or a congested sounding (croupy) cough.  You have an oral temperature above 102.5, not controlled by medicine.  There is increasing  pain or tenderness near or in the surgical site.    AMBULATORY SURGERY  DISCHARGE INSTRUCTIONS   1) The drugs that you were given will stay in your system until tomorrow so for the next 24 hours you should not:  A) Drive an automobile B) Make any legal decisions C) Drink any alcoholic beverage   2) You may resume regular meals tomorrow.  Today it is better to start with liquids and gradually work up to solid foods.  You may eat anything you prefer, but it is better to start with liquids, then soup and crackers, and gradually work up to solid foods.   3) Please notify your doctor immediately if you have any unusual bleeding, trouble breathing, redness and pain at the surgery site, drainage, fever, or pain not relieved by medication.    4) Additional Instructions: TAKE A STOOL SOFTENER TWICE A DAY WHILE TAKING NARCOTIC PAIN MEDICINE TO PREVENT CONSTIPATION   Please contact your physician with any problems or Same Day Surgery at [phone], Monday through Friday 6 am to 4 pm, or [place] at [place] number at [phone].

## 2016-08-19 NOTE — H&P (Signed)
UPDATE TO PREVIOUS HISTORY AND PHYSICAL  The patient has been seen and examined.  H&P is up to date, no changes noted.  Lori Le is a 51 y.o. G0P0000 female scheduled for IUD removal, Hysteroscopy D&C and Novasure Ablation.  Indications for procedure are abnormal perimenopausal bleeding.  All questions answered. Patient can proceed to the OR for scheduled procedure.   [name], MD 08/19/2016 12:05 PM

## 2016-08-19 NOTE — Anesthesia Procedure Notes (Signed)
Procedure Name: LMA Insertion Date/Time: 08/19/2016 12:47 PM Performed by: Kennon Holter Pre-anesthesia Checklist: Patient identified, Patient being monitored, Timeout performed, Emergency Drugs available and Suction available Patient Re-evaluated:Patient Re-evaluated prior to inductionOxygen Delivery Method: Circle system utilized Preoxygenation: Pre-oxygenation with 100% oxygen Intubation Type: IV induction Ventilation: Mask ventilation without difficulty LMA: LMA inserted LMA Size: 3.5 Tube type: Oral Number of attempts: 1 Placement Confirmation: positive ETCO2 and breath sounds checked- equal and bilateral Tube secured with: Tape Dental Injury: Teeth and Oropharynx as per pre-operative assessment

## 2016-08-19 NOTE — Transfer of Care (Signed)
Immediate Anesthesia Transfer of Care Note  Patient: Lori Le  Procedure(s) Performed: Procedure(s): DILATATION & CURETTAGE/HYSTEROSCOPY WITH NOVASURE ABLATION (N/A)  Patient Location: PACU  Anesthesia Type:General  Level of Consciousness: awake, alert  and oriented  Airway & Oxygen Therapy: Patient Spontanous Breathing  Post-op Assessment: Report given to RN and Post -op Vital signs reviewed and stable  Post vital signs: Reviewed and stable  Last Vitals:  Vitals:   08/19/16 1341 08/19/16 1344  BP: (!) 129/50   Pulse: 81   Resp: 14   Temp: 36.8 C 36.8 C    Last Pain:  Vitals:   08/19/16 1344  TempSrc: Temporal        Past Surgical History:  Procedure Laterality Date  . BREAST BIOPSY Right    neg- bx/clip  . CHOLECYSTECTOMY  2003  . ESOPHAGOGASTRODUODENOSCOPY (EGD) WITH PROPOFOL N/A 12/05/2015   Procedure: ESOPHAGOGASTRODUODENOSCOPY (EGD) WITH PROPOFOL;  Surgeon: Lollie Sails, MD;  Location: [facility] ENDOSCOPY;  Service: Endoscopy;  Laterality: N/A;  . ESOPHAGOGASTRODUODENOSCOPY (EGD) WITH PROPOFOL N/A 02/06/2016   Procedure: ESOPHAGOGASTRODUODENOSCOPY (EGD) WITH PROPOFOL;  Surgeon: Lollie Sails, MD;  Location: [facility] ENDOSCOPY;  Service: Endoscopy;  Laterality: N/A;  . FOOT SURGERY  1994  . GASTRIC BYPASS  2011  . JOINT REPLACEMENT Left 09/27/2014   HIP  . JOINT REPLACEMENT Left 12/01/2014   KNEE  . KNEE ARTHROSCOPY Right   . KNEE ARTHROSCOPY Left 03/23/2015   Procedure: ARTHROSCOPY KNEE, partial synovectomy;  Surgeon: Hessie Knows, MD;  Location: [facility] ORS;  Service: Orthopedics;  Laterality: Left;  . LAPAROSCOPIC GASTRIC BANDING  2010  . ROTATOR CUFF REPAIR Bilateral    right shoulder 07-26-2016  . SHOULDER ARTHROSCOPY Left 06/20/2015   Procedure: ARTHROSCOPY SHOULDER, REPAIR OF MASSIVE ROTATOR CUFF TEAR, TENODESIS, DECOMPRESSION DEBRIDEMENT;  Surgeon: Corky Mull, MD;  Location: [facility] ORS;  Service: Orthopedics;  Laterality: Left;  . TOTAL HIP  ARTHROPLASTY Left 09/27/2014   Procedure: TOTAL HIP ARTHROPLASTY ANTERIOR APPROACH;  Surgeon: Hessie Knows, MD;  Location: [facility] ORS;  Service: Orthopedics;  Laterality: Left;  . TOTAL KNEE ARTHROPLASTY Left 12/01/2014   Procedure: TOTAL KNEE ARTHROPLASTY;  Surgeon: Hessie Knows, MD;  Location: [facility] ORS;  Service: Orthopedics;  Laterality: Left;   Past Medical History:  Diagnosis Date  . Allergic rhinitis due to allergen   . Allergy   . Anemia   . Arthritis   . Cancer ([place])    skin ca  . Chronic idiopathic urticaria   . Complication of anesthesia    nausea, slow to wake up  . Depression   . GERD (gastroesophageal reflux disease)   . Gout   . Gout   . Hashimoto's thyroiditis   . Heart murmur   . History of eating disorder   . Hives   . Hyperlipidemia   . Hypertension   . Hypothyroidism   . Lower extremity edema   . Melanoma in situ ([place])    left shoulder  . Migraine   . Mitral valve disorder   . Motion sickness    all moving vehicles  . PCOS (polycystic ovarian syndrome)   . PONV (postoperative nausea and vomiting)   . Thyroid disease   . Ulcer   . Vertigo    last episode over 1 yr ago   No current facility-administered medications on file prior to encounter.    Current Outpatient Prescriptions on File Prior to Encounter  Medication Sig Dispense Refill  . buPROPion (WELLBUTRIN XL) 300 MG 24 hr tablet TAKE  1 TABLET (300 MG TOTAL) BY MOUTH DAILY. 30 tablet 2  . furosemide (LASIX) 40 MG tablet Take 1 tablet (40 mg total) by mouth daily. 90 tablet 1  . KLOR-CON 10 10 MEQ tablet TAKE ONE TABLET TWICE FOR THREE DAYS AND THEN DAILY (Patient taking differently: TAKE ONE TABLET DAILY) 30 tablet 2  . levothyroxine (SYNTHROID, LEVOTHROID) 137 MCG tablet TAKE ONE TABLET BY MOUTH ONCE DAILY BEFORE BREAKFAST (Patient taking differently: Take 137 mcg by mouth daily before breakfast. DAILY BEFORE BREAKFAST) 90 tablet 0  . pantoprazole (PROTONIX) 40 MG tablet Take 40 mg by mouth daily.      [name] triamterene-hydrochlorothiazide (MAXZIDE-25) 37.5-25 MG tablet Take 1 tablet by mouth daily. 90 tablet 1  . estradiol (ESTRACE VAGINAL) 0.1 MG/GM vaginal cream Place 1 Applicatorful vaginally 3 (three) times a week. (Patient not taking: Reported on 08/14/2016) 42.5 g 2  . medroxyPROGESTERone (PROVERA) 10 MG tablet Take 1 tablet (10 mg total) by mouth daily. Use for ten days (Patient not taking: Reported on 08/19/2016) 10 tablet 2  . triamcinolone cream (KENALOG) 0.1 % Apply 1 application topically 2 (two) times daily. (Patient not taking: Reported on 0/17/4944) 30 g 0    Complications: No apparent anesthesia complications

## 2016-08-19 NOTE — Anesthesia Post-op Follow-up Note (Cosign Needed)
Anesthesia QCDR form completed.        

## 2016-08-19 NOTE — Op Note (Signed)
Procedure(s): DILATATION & CURETTAGE/HYSTEROSCOPY WITH NOVASURE ABLATION Procedure Note  Lori Le female 51 y.o. 08/19/2016  Indications: The patient is a 51 y.o. G0P0000 female with abnormal perimenopausal bleeding, ParaGard IUD in place.  Pre-operative Diagnosis: Abnormal perimenopausal bleeding, ParaGard IUD in place  Post-operative Diagnosis: Same  Surgeon: [name], MD  Assistants: None  Anesthesia: General endotracheal anesthesia  Findings: The uterus was sounded to 6.5 cm.   IUD strings visible, ~ 3 cm from external cervical os.  Thinned endometrium. No structural defects. Bilateral tubal ostia identified bilaterally. Adequate charring of endometrium post-ablation. No evidence of perforation.  Procedure Details: The patient was seen in the Holding Room. The risks, benefits, complications, treatment options, and expected outcomes were discussed with the patient.  The patient concurred with the proposed plan, giving informed consent.  The site of surgery properly noted/marked. The patient was taken to the Operating Room, identified as Lori Le and the procedure verified as Procedure(s) (LRB): DILATATION & CURETTAGE/HYSTEROSCOPY WITH NOVASURE ABLATION (N/A). A Time Out was held and the above information confirmed.  She was then placed under general anesthesia without difficulty. She was placed in the dorsal lithotomy position, and was prepped and draped in a sterile manner.  A straight catheterization was performed. A sterile speculum was inserted into the vagina and the cervix was grasped at the anterior lip using a single-toothed tenaculum.  The IUD strings were grasped using a ring forceps and was removed without difficulty.  The uterus was then sounded to 6.5 cm.  The cervical length was determined to be 3 cm.  Cervical dilation was performed. A 5 mm hysteroscope was introduced into the uterus under direct visualization. The cavity was allowed to fill, and then  the entire cavity was explored with the findings described above. The hysteroscope was removed, and a sharp curette was then passed into the uterus and endometrial sampling was collected for pathology.   Next the NovaSure instrument was primed per instructions. The instrument was then placed into the endometrial canal and activated.  Total ablation time was 2 min 0 sec.  The NovaSure instrument was then removed from the uterine cavity.  The hysteroscope was then re-introduced for final survey, with adequate charring of the endometrium noted.  The hysteroscope was removed from the patient's uterine cavity. The tenaculum was removed and excellent hemostasis was noted. The speculum was removed from the vagina.   All instrument and sponge counts were correct at the end of the procedure x 2.  The patient tolerated the procedure well.  She was awakened and taken to the PACU in stable condition.    Specimens:  Endometrial curettings         Implants: None         Complications:  None; patient tolerated the procedure well.         Disposition: PACU - hemodynamically stable.         Condition: stable   [name], MD [facility]    Estimated Blood Loss:  10 ml      Drains: straight catheterization prior to procedure with  10 ml of clear urine         Total IV Fluids:  550 ml  Specimens: Endometrial curettings (scant)  Implants: None         Complications:  None; patient tolerated the procedure well.         Disposition: PACU - hemodynamically stable.         Condition: stable  [name], MD [facility]

## 2016-08-20 ENCOUNTER — Encounter: Payer: Self-pay | Admitting: Obstetrics and Gynecology

## 2016-08-20 LAB — SURGICAL PATHOLOGY

## 2016-08-20 NOTE — Telephone Encounter (Signed)
Anyway to get an earlier appt with psych.  See her my chart message.  I already have her on medication.  Will need their input regarding further treatment.  Thanks

## 2016-08-21 NOTE — Telephone Encounter (Signed)
Unfortunately no. The only thing I can recommend is that she call them to see if there are any cancellations.

## 2016-08-22 ENCOUNTER — Telehealth: Payer: Self-pay | Admitting: Internal Medicine

## 2016-08-22 ENCOUNTER — Encounter: Payer: Self-pay | Admitting: Physician Assistant

## 2016-08-22 ENCOUNTER — Ambulatory Visit (INDEPENDENT_AMBULATORY_CARE_PROVIDER_SITE_OTHER): Payer: 59 | Admitting: Physician Assistant

## 2016-08-22 VITALS — BP 120/80 | HR 84 | Temp 98.8°F | Ht 67.5 in | Wt 210.4 lb

## 2016-08-22 DIAGNOSIS — M109 Gout, unspecified: Secondary | ICD-10-CM | POA: Diagnosis not present

## 2016-08-22 MED ORDER — DOXYCYCLINE HYCLATE 100 MG PO TABS: 100.0000 mg | ORAL_TABLET | Freq: Two times a day (BID) | ORAL | 0 refills | Status: DC

## 2016-08-22 MED ORDER — PREDNISONE 5 MG PO TABS: ORAL_TABLET | ORAL | 0 refills | Status: DC

## 2016-08-22 NOTE — Telephone Encounter (Addendum)
FYI Patient seen at [place] PA-C .

## 2016-08-22 NOTE — Telephone Encounter (Signed)
New Site Day - [facility] Patient Name: Lori Le DOB: 07-10-1965 Initial Comment Redness swelling to the ankle, had an ablation on Monday Nurse Assessment Nurse: Markus Daft, RN, Sherre Poot Date/Time Eilene Ghazi Time): 08/22/2016 8:45:09 AM Confirm and document reason for call. If symptomatic, describe symptoms. ---Caller states that she has redness, heat, and swelling to the right ankle noticed last night. She's had edema for a while especially around the time of her period and having some peri menopause s/s and so period has been more than 30 days ago. Elevated foot all night, and this AM it is the same. 3 wks ago had rotator cuff repair to right shoulder. Also, she had an uterus ablation on Monday. A little spotting from that. No fever. Does the patient have any new or worsening symptoms? ---Yes Will a triage be completed? ---Yes Related visit to physician within the last 2 weeks? ---No Does the PT have any chronic conditions? (i.e. diabetes, asthma, etc.) ---Yes List chronic conditions. ---edema in feet for several years off/on, Hashimotos disease Is the patient pregnant or possibly pregnant? (Ask all females between the ages of 53-55) ---No Is this a behavioral health or substance abuse call? ---No Guidelines Guideline Title Affirmed Question Affirmed Notes Leg Swelling and Edema [1] Red area or streak [2] large (> 2 in. or 5 cm) Final Disposition User See Physician within 4 Hours (or PCP triage) Markus Daft, RN, [place] Referrals REFERRED TO PCP OFFICE Disagree/Comply: Comply

## 2016-08-22 NOTE — Telephone Encounter (Signed)
Appointment scheduled [place] office at 11:30 [facility]

## 2016-08-22 NOTE — Progress Notes (Signed)
Lori Le is a 51 y.o. female here for right outer ankle red and edematous, warm to touch.  I acted as a Education administrator for [employer], PA-C [employer], LPN  History of Present Illness:   No chief complaint on file.   Ankle Pain   The incident occurred 6 to 12 hours ago (Started yesterday around 7:00 PM last night, noticied left ankle red and swollen, swelling has increased in the last 24 hours, ankle warm to touch.). The incident occurred at home. The injury mechanism is unknown. The pain is present in the left ankle. The quality of the pain is described as aching. The pain is at a severity of 4/10. The pain is moderate. The pain has been constant since onset. Pertinent negatives include no inability to bear weight, loss of motion, loss of sensation, muscle weakness, numbness or tingling. It is unknown if a foreign body is present. The symptoms are aggravated by movement and weight bearing. She has tried elevation, acetaminophen and rest for the symptoms. The treatment provided no relief.   She has a history of L hip and L knee replacement. She also reports a history of gout, mostly in her toes. Oral steroids typically provide relief for her. She denies fever or known tick-exposure. She is 4 days s/p endometrial ablation and 3 weeks s/p R shoulder rotator cuff repair. She reports that she was recently given steroids a few weeks ago and was okayed to use this by her Psychologist, sport and exercise.  PMHx, SurgHx, SocialHx, Medications, and Allergies were reviewed in the Visit Navigator and updated as appropriate.  Current Medications:   Current Outpatient Prescriptions:  .  acetaminophen (TYLENOL) 650 MG CR tablet, Take 1,300 mg by mouth 2 (two) times daily., Disp: , Rfl:  .  buPROPion (WELLBUTRIN XL) 300 MG 24 hr tablet, TAKE 1 TABLET (300 MG TOTAL) BY MOUTH DAILY., Disp: 30 tablet, Rfl: 2 .  furosemide (LASIX) 40 MG tablet, Take 1 tablet (40 mg total) by mouth daily., Disp: 90 tablet, Rfl: 1 .  KLOR-CON 10 10  MEQ tablet, TAKE ONE TABLET TWICE FOR THREE DAYS AND THEN DAILY (Patient taking differently: TAKE ONE TABLET DAILY), Disp: 30 tablet, Rfl: 2 .  levothyroxine (SYNTHROID, LEVOTHROID) 137 MCG tablet, TAKE ONE TABLET BY MOUTH ONCE DAILY BEFORE BREAKFAST (Patient taking differently: Take 137 mcg by mouth daily before breakfast. DAILY BEFORE BREAKFAST), Disp: 90 tablet, Rfl: 0 .  pantoprazole (PROTONIX) 40 MG tablet, Take 40 mg by mouth daily. , Disp: , Rfl:  .  triamcinolone cream (KENALOG) 0.1 %, Apply 1 application topically 2 (two) times daily., Disp: 30 g, Rfl: 0 .  triamterene-hydrochlorothiazide (MAXZIDE-25) 37.5-25 MG tablet, Take 1 tablet by mouth daily., Disp: 90 tablet, Rfl: 1 .  doxycycline (VIBRA-TABS) 100 MG tablet, Take 1 tablet (100 mg total) by mouth 2 (two) times daily., Disp: 20 tablet, Rfl: 0 .  estradiol (ESTRACE VAGINAL) 0.1 MG/GM vaginal cream, Place 1 Applicatorful vaginally 3 (three) times a week. (Patient not taking: Reported on 08/14/2016), Disp: 42.5 g, Rfl: 2 .  predniSONE (DELTASONE) 5 MG tablet, Take 4 tablets daily for 3 days, then 3 tablets daily for 3 days, then 2 tablets daily for 3 days, and then 1 tablet daily for 3 days., Disp: 30 tablet, Rfl: 0   Review of Systems:   Review of Systems  Constitutional: Negative for chills and fever.  HENT: Negative for hearing loss.   Eyes: Negative for blurred vision and double vision.  Gastrointestinal: Negative for  heartburn.  Skin: Negative for rash.       Swelling and erythema to R ankle  Neurological: Negative for dizziness, tingling, sensory change, speech change, focal weakness and numbness.    Vitals:   Vitals:   08/22/16 1125  BP: 120/80  Pulse: 84  Temp: 98.8 F (37.1 C)  TempSrc: Oral  SpO2: 98%  Weight: 210 lb 6.1 oz (95.4 kg)  Height: 5' 7.5" (1.715 m)     Body mass index is 32.46 kg/m.  Physical Exam:   Physical Exam  Constitutional: She appears well-developed. She is cooperative.  Non-toxic  appearance. She does not have a sickly appearance. She does not appear ill. No distress.  Cardiovascular: Normal rate, regular rhythm, S1 normal, S2 normal, normal heart sounds and normal pulses.   No LE edema  Pulmonary/Chest: Effort normal and breath sounds normal.  Musculoskeletal:       Right ankle: She exhibits swelling.  Swelling, warmth and erythema to lateral R malleolus; no decrease in ROM, tenderness to deep palpation, normal pulses, no calf tenderness, negative Homans sign  Neurological: She is alert.  Nursing note and vitals reviewed.     Assessment and Plan:    Diagnoses and all orders for this visit:  Acute gout of right foot, unspecified cause Patient evaluated with Dr. Briscoe Deutscher. She appears to have gout located at her right ankle. She has a history of this and does well with prednisone. I sent in a 12 day taper for her as well as an antibiotic to cover for any possible infection. I advised for her to follow-up with [name] if she develops any fevers, chills, calf pain, shortness of breath, or general lack of improvement despite treatment.  Other orders -     predniSONE (DELTASONE) 5 MG tablet; Take 4 tablets daily for 3 days, then 3 tablets daily for 3 days, then 2 tablets daily for 3 days, and then 1 tablet daily for 3 days. -     doxycycline (VIBRA-TABS) 100 MG tablet; Take 1 tablet (100 mg total) by mouth 2 (two) times daily.   . Reviewed expectations re: course of current medical issues. . Discussed self-management of symptoms. . Outlined signs and symptoms indicating need for more acute intervention. . Patient verbalized understanding and all questions were answered. . See orders for this visit as documented in the electronic medical record. . Patient received an After-Visit Summary.  CMA or LPN served as scribe during this visit. History, Physical, and Plan performed by medical provider. Documentation and orders reviewed and attested to.  Inda Coke,  PA-C

## 2016-08-22 NOTE — Telephone Encounter (Signed)
Noted.  Tell her to keep us posted.   ?

## 2016-08-22 NOTE — Patient Instructions (Signed)
Start the oral prednisone to help with the gout flare.  We will also have you start doxycycline to prevent or cover for any infection.  Please follow-up with PCP if no improvement of symptoms.   Gout Gout is painful swelling that can occur in some of your joints. Gout is a type of arthritis. This condition is caused by having too much uric acid in your body. Uric acid is a chemical that forms when your body breaks down substances called purines. Purines are important for building body proteins. When your body has too much uric acid, sharp crystals can form and build up inside your joints. This causes pain and swelling. Gout attacks can happen quickly and be very painful (acute gout). Over time, the attacks can affect more joints and become more frequent (chronic gout). Gout can also cause uric acid to build up under your skin and inside your kidneys. What are the causes? This condition is caused by too much uric acid in your blood. This can occur because:  Your kidneys do not remove enough uric acid from your blood. This is the most common cause.  Your body makes too much uric acid. This can occur with some cancers and cancer treatments. It can also occur if your body is breaking down too many red blood cells (hemolytic anemia).  You eat too many foods that are high in purines. These foods include organ meats and some seafood. Alcohol, especially beer, is also high in purines. A gout attack may be triggered by trauma or stress. What increases the risk? This condition is more likely to develop in people who:  Have a family history of gout.  Are female and middle-aged.  Are female and have gone through menopause.  Are obese.  Frequently drink alcohol, especially beer.  Are dehydrated.  Lose weight too quickly.  Have an organ transplant.  Have lead poisoning.  Take certain medicines, including aspirin, cyclosporine, diuretics, levodopa, and niacin.  Have kidney disease or  psoriasis. What are the signs or symptoms? An attack of acute gout happens quickly. It usually occurs in just one joint. The most common place is the big toe. Attacks often start at night. Other joints that may be affected include joints of the feet, ankle, knee, fingers, wrist, or elbow. Symptoms may include:  Severe pain.  Warmth.  Swelling.  Stiffness.  Tenderness. The affected joint may be very painful to touch.  Shiny, red, or purple skin.  Chills and fever. Chronic gout may cause symptoms more frequently. More joints may be involved. You may also have white or yellow lumps (tophi) on your hands or feet or in other areas near your joints. How is this diagnosed? This condition is diagnosed based on your symptoms, medical history, and physical exam. You may have tests, such as:  Blood tests to measure uric acid levels.  Removal of joint fluid with a needle (aspiration) to look for uric acid crystals.  X-rays to look for joint damage. How is this treated? Treatment for this condition has two phases: treating an acute attack and preventing future attacks. Acute gout treatment may include medicines to reduce pain and swelling, including:  NSAIDs.  Steroids. These are strong anti-inflammatory medicines that can be taken by mouth (orally) or injected into a joint.  Colchicine. This medicine relieves pain and swelling when it is taken soon after an attack. It can be given orally or through an IV tube. Preventive treatment may include:  Daily use of smaller doses of  NSAIDs or colchicine.  Use of a medicine that reduces uric acid levels in your blood.  Changes to your diet. You may need to see a specialist about healthy eating (dietitian). Follow these instructions at home: During a Gout Attack   If directed, apply ice to the affected area:  Put ice in a plastic bag.  Place a towel between your skin and the bag.  Leave the ice on for 20 minutes, 2-3 times a day.  Rest  the joint as much as possible. If the affected joint is in your leg, you may be given crutches to use.  Raise (elevate) the affected joint above the level of your heart as often as possible.  Drink enough fluids to keep your urine clear or pale yellow.  Take over-the-counter and prescription medicines only as told by your health care provider.  Do not drive or operate heavy machinery while taking prescription pain medicine.  Follow instructions from your health care provider about eating or drinking restrictions.  Return to your normal activities as told by your health care provider. Ask your health care provider what activities are safe for you. Avoiding Future Gout Attacks   Follow a low-purine diet as told by your dietitian or health care provider. Avoid foods and drinks that are high in purines, including liver, kidney, anchovies, asparagus, herring, mushrooms, mussels, and beer.  Limit alcohol intake to no more than 1 drink a day for nonpregnant women and 2 drinks a day for men. One drink equals 12 oz of beer, 5 oz of wine, or 1 oz of hard liquor.  Maintain a healthy weight or lose weight if you are overweight. If you want to lose weight, talk with your health care provider. It is important that you do not lose weight too quickly.  Start or maintain an exercise program as told by your health care provider.  Drink enough fluids to keep your urine clear or pale yellow.  Take over-the-counter and prescription medicines only as told by your health care provider.  Keep all follow-up visits as told by your health care provider. This is important. Contact a health care provider if:  You have another gout attack.  You continue to have symptoms of a gout attack after10 days of treatment.  You have side effects from your medicines.  You have chills or a fever.  You have burning pain when you urinate.  You have pain in your lower back or belly. Get help right away if:  You have  severe or uncontrolled pain.  You cannot urinate. This information is not intended to replace advice given to you by your health care provider. Make sure you discuss any questions you have with your health care provider. Document Released: 03/15/2000 Document Revised: 08/24/2015 Document Reviewed: 12/29/2014 Elsevier Interactive Patient Education  2017 [employer].

## 2016-08-22 NOTE — Telephone Encounter (Signed)
Pt called and stated that she woke up with redness, swelling and hot to the touch in her right ankle. Pt did have an ablation earlier in the week. Sent to call to [facility].  Call pt @ [phone]

## 2016-08-22 NOTE — Telephone Encounter (Signed)
Called patient back and advised to keep [name] posted.

## 2016-08-22 NOTE — Telephone Encounter (Signed)
Do you mind notifying her of this information.  Thanks    Dr Nicki Reaper

## 2016-08-23 ENCOUNTER — Encounter: Payer: Self-pay | Admitting: Internal Medicine

## 2016-08-23 NOTE — Telephone Encounter (Signed)
I did review the picture.  Hard to see details on the picture.  She did just start, so may not see results yet, but needs to keep a close eye on the swelling and the rash.  If no improvement, or if worsens - needs to be reevaluated.  [facility] and [facility] open over the weekend.  Also if acute problems - ER.

## 2016-08-23 NOTE — Telephone Encounter (Signed)
Looked at the picture.  Hard to see specific detail.

## 2016-08-27 ENCOUNTER — Encounter: Payer: 59 | Admitting: Obstetrics and Gynecology

## 2016-08-28 ENCOUNTER — Encounter: Payer: Self-pay | Admitting: Obstetrics and Gynecology

## 2016-08-28 ENCOUNTER — Other Ambulatory Visit: Payer: Self-pay | Admitting: Internal Medicine

## 2016-08-28 ENCOUNTER — Ambulatory Visit (INDEPENDENT_AMBULATORY_CARE_PROVIDER_SITE_OTHER): Payer: 59 | Admitting: Obstetrics and Gynecology

## 2016-08-28 VITALS — BP 134/64 | HR 80 | Ht 67.5 in | Wt 211.1 lb

## 2016-08-28 DIAGNOSIS — Z9889 Other specified postprocedural states: Secondary | ICD-10-CM

## 2016-08-28 NOTE — Progress Notes (Signed)
    OBSTETRICS/GYNECOLOGY POST-OPERATIVE CLINIC VISIT  Subjective:     Lori Le is a 51 y.o. female who presents to the clinic 1 weeks status post Hysteroscopy D&C with Novasure ablation for abnormal uterine bleeding. Eating a regular diet without difficulty. Bowel movements are normal. The patient is not having any pain.  Notes small amount of thin brown discharge.   The following portions of the patient's history were reviewed and updated as appropriate: allergies, current medications, past family history, past medical history, past social history, past surgical history and problem list.  Review of Systems Pertinent items noted in HPI and remainder of comprehensive ROS otherwise negative.    Objective:    BP 134/64 (BP Location: Left Arm, Patient Position: Sitting, Cuff Size: Large)   Pulse 80   Ht 5' 7.5" (1.715 m)   Wt 211 lb 1.6 oz (95.8 kg)   LMP 08/08/2016 (Approximate)   BMI 32.57 kg/m  General:  alert and no distress  Abdomen: soft, bowel sounds active, non-tender  Incision:   None  Pelvis:   deferred    Pathology (5/05/02/2016):  A. 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  - NEGATIVE FOR HYPERPLASIA AND CARCINOMA.   Assessment:    Doing well postoperatively.  S/p Hysteroscopy D&C with Novasure ablation    Plan:   1. Continue any current medications as needed. 2. Wound care discussed. 3. Operative findings reviewed. Pathology report discussed. 4. Activity restrictions: Pelvic rest x 1 week 5. Anticipated return to work: 2-3 weeks. 6. Follow up: 9 months for annual exam, or sooner as needed    [name], MD [facility]

## 2016-09-03 ENCOUNTER — Encounter: Payer: Self-pay | Admitting: Physician Assistant

## 2016-09-04 ENCOUNTER — Other Ambulatory Visit: Payer: Self-pay | Admitting: Physician Assistant

## 2016-09-04 MED ORDER — FLUCONAZOLE 150 MG PO TABS: 150.0000 mg | ORAL_TABLET | Freq: Once | ORAL | 0 refills | Status: AC

## 2016-09-04 NOTE — Telephone Encounter (Signed)
Spoke to pt, told her Rx for Diflucan was sent to pharmacy. If this doesn't work for your symptoms, you needs to follow up with [name] or PCP. Pt verbalized understanding.

## 2016-09-19 ENCOUNTER — Encounter (HOSPITAL_COMMUNITY): Payer: Self-pay | Admitting: Psychiatry

## 2016-09-19 ENCOUNTER — Ambulatory Visit (INDEPENDENT_AMBULATORY_CARE_PROVIDER_SITE_OTHER): Payer: 59 | Admitting: Psychiatry

## 2016-09-19 VITALS — BP 136/74 | HR 105 | Ht 67.5 in | Wt 209.8 lb

## 2016-09-19 DIAGNOSIS — G8929 Other chronic pain: Secondary | ICD-10-CM

## 2016-09-19 DIAGNOSIS — Z79899 Other long term (current) drug therapy: Secondary | ICD-10-CM | POA: Diagnosis not present

## 2016-09-19 DIAGNOSIS — F341 Dysthymic disorder: Secondary | ICD-10-CM

## 2016-09-19 DIAGNOSIS — F331 Major depressive disorder, recurrent, moderate: Secondary | ICD-10-CM

## 2016-09-19 DIAGNOSIS — Z818 Family history of other mental and behavioral disorders: Secondary | ICD-10-CM

## 2016-09-19 MED ORDER — DULOXETINE HCL 30 MG PO CPEP: ORAL_CAPSULE | ORAL | 1 refills | Status: DC
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
## 2016-09-29 ENCOUNTER — Other Ambulatory Visit: Payer: Self-pay | Admitting: Internal Medicine

## 2016-09-30 ENCOUNTER — Other Ambulatory Visit: Payer: Self-pay

## 2016-09-30 DIAGNOSIS — Z760 Encounter for issue of repeat prescription: Secondary | ICD-10-CM

## 2016-09-30 MED ORDER — TRIAMTERENE-HCTZ 37.5-25 MG PO TABS: 1.0000 | ORAL_TABLET | Freq: Every day | ORAL | 1 refills | Status: DC

## 2016-10-23 ENCOUNTER — Encounter (HOSPITAL_COMMUNITY): Payer: Self-pay | Admitting: Psychiatry

## 2016-10-23 ENCOUNTER — Ambulatory Visit (INDEPENDENT_AMBULATORY_CARE_PROVIDER_SITE_OTHER): Payer: 59 | Admitting: Psychiatry

## 2016-10-23 DIAGNOSIS — G4700 Insomnia, unspecified: Secondary | ICD-10-CM | POA: Diagnosis not present

## 2016-10-23 DIAGNOSIS — F331 Major depressive disorder, recurrent, moderate: Secondary | ICD-10-CM | POA: Diagnosis not present

## 2016-10-23 MED ORDER — DULOXETINE HCL 60 MG PO CPEP: 60.0000 mg | ORAL_CAPSULE | Freq: Every day | ORAL | 1 refills | Status: DC
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
## 2016-10-24 ENCOUNTER — Inpatient Hospital Stay: Payer: 59

## 2016-10-24 ENCOUNTER — Ambulatory Visit
Admission: RE | Admit: 2016-10-24 | Discharge: 2016-10-24 | Disposition: A | Discharge status: Home / Self Care | Payer: 59 | Source: Ambulatory Visit | Attending: Hematology and Oncology | Admitting: Hematology and Oncology

## 2016-10-24 ENCOUNTER — Inpatient Hospital Stay (HOSPITAL_BASED_OUTPATIENT_CLINIC_OR_DEPARTMENT_OTHER): Payer: 59 | Admitting: Hematology and Oncology

## 2016-10-24 ENCOUNTER — Encounter: Payer: Self-pay | Admitting: Hematology and Oncology

## 2016-10-24 ENCOUNTER — Other Ambulatory Visit: Payer: Self-pay | Admitting: Internal Medicine

## 2016-10-24 ENCOUNTER — Inpatient Hospital Stay: Payer: 59 | Attending: Hematology and Oncology

## 2016-10-24 VITALS — BP 139/83 | HR 96 | Temp 97.5°F | Resp 18 | Wt 221.1 lb

## 2016-10-24 DIAGNOSIS — E538 Deficiency of other specified B group vitamins: Secondary | ICD-10-CM | POA: Insufficient documentation

## 2016-10-24 DIAGNOSIS — K5900 Constipation, unspecified: Secondary | ICD-10-CM | POA: Insufficient documentation

## 2016-10-24 DIAGNOSIS — Z803 Family history of malignant neoplasm of breast: Secondary | ICD-10-CM | POA: Insufficient documentation

## 2016-10-24 DIAGNOSIS — M109 Gout, unspecified: Secondary | ICD-10-CM

## 2016-10-24 DIAGNOSIS — R42 Dizziness and giddiness: Secondary | ICD-10-CM | POA: Insufficient documentation

## 2016-10-24 DIAGNOSIS — F329 Major depressive disorder, single episode, unspecified: Secondary | ICD-10-CM | POA: Insufficient documentation

## 2016-10-24 DIAGNOSIS — Z9884 Bariatric surgery status: Secondary | ICD-10-CM | POA: Diagnosis not present

## 2016-10-24 DIAGNOSIS — E282 Polycystic ovarian syndrome: Secondary | ICD-10-CM | POA: Diagnosis not present

## 2016-10-24 DIAGNOSIS — R609 Edema, unspecified: Secondary | ICD-10-CM

## 2016-10-24 DIAGNOSIS — R011 Cardiac murmur, unspecified: Secondary | ICD-10-CM | POA: Diagnosis not present

## 2016-10-24 DIAGNOSIS — Z79899 Other long term (current) drug therapy: Secondary | ICD-10-CM | POA: Insufficient documentation

## 2016-10-24 DIAGNOSIS — D649 Anemia, unspecified: Secondary | ICD-10-CM

## 2016-10-24 DIAGNOSIS — E063 Autoimmune thyroiditis: Secondary | ICD-10-CM

## 2016-10-24 DIAGNOSIS — E785 Hyperlipidemia, unspecified: Secondary | ICD-10-CM | POA: Diagnosis not present

## 2016-10-24 DIAGNOSIS — K219 Gastro-esophageal reflux disease without esophagitis: Secondary | ICD-10-CM

## 2016-10-24 DIAGNOSIS — M129 Arthropathy, unspecified: Secondary | ICD-10-CM | POA: Insufficient documentation

## 2016-10-24 DIAGNOSIS — Z85828 Personal history of other malignant neoplasm of skin: Secondary | ICD-10-CM | POA: Diagnosis not present

## 2016-10-24 DIAGNOSIS — R110 Nausea: Secondary | ICD-10-CM | POA: Diagnosis not present

## 2016-10-24 DIAGNOSIS — R600 Localized edema: Secondary | ICD-10-CM | POA: Diagnosis not present

## 2016-10-24 DIAGNOSIS — E039 Hypothyroidism, unspecified: Secondary | ICD-10-CM | POA: Insufficient documentation

## 2016-10-24 DIAGNOSIS — L509 Urticaria, unspecified: Secondary | ICD-10-CM | POA: Diagnosis not present

## 2016-10-24 DIAGNOSIS — Z85820 Personal history of malignant melanoma of skin: Secondary | ICD-10-CM

## 2016-10-24 DIAGNOSIS — D500 Iron deficiency anemia secondary to blood loss (chronic): Secondary | ICD-10-CM | POA: Diagnosis not present

## 2016-10-24 DIAGNOSIS — I10 Essential (primary) hypertension: Secondary | ICD-10-CM | POA: Diagnosis not present

## 2016-10-24 LAB — CBC WITH DIFFERENTIAL/PLATELET
Basophils Absolute: 0.1 10*3/uL (ref 0–0.1)
Basophils Relative: 1 %
Eosinophils Absolute: 0.1 10*3/uL (ref 0–0.7)
Eosinophils Relative: 2 %
HCT: 40.1 % (ref 35.0–47.0)
Hemoglobin: 13.7 g/dL (ref 12.0–16.0)
Lymphocytes Relative: 29 %
Lymphs Abs: 1.9 10*3/uL (ref 1.0–3.6)
MCH: 29.4 pg (ref 26.0–34.0)
MCHC: 34.1 g/dL (ref 32.0–36.0)
MCV: 86.2 fL (ref 80.0–100.0)
Monocytes Absolute: 0.4 10*3/uL (ref 0.2–0.9)
Monocytes Relative: 6 %
Neutro Abs: 4.1 10*3/uL (ref 1.4–6.5)
Neutrophils Relative %: 62 %
Platelets: 297 10*3/uL (ref 150–440)
RBC: 4.65 MIL/uL (ref 3.80–5.20)
RDW: 13.8 % (ref 11.5–14.5)
WBC: 6.6 10*3/uL (ref 3.6–11.0)

## 2016-10-24 LAB — IRON AND TIBC
Iron: 33 ug/dL (ref 28–170)
Saturation Ratios: 9 % — ABNORMAL LOW (ref 10.4–31.8)
TIBC: 375 ug/dL (ref 250–450)
UIBC: 342 ug/dL

## 2016-10-24 LAB — VITAMIN B12: Vitamin B-12: 252 pg/mL (ref 180–914)

## 2016-10-24 LAB — FERRITIN: Ferritin: 14 ng/mL (ref 11–307)

## 2016-10-24 LAB — FOLATE: Folate: 30 ng/mL (ref >5.9)

## 2016-10-24 NOTE — Progress Notes (Signed)
[facility] day:  10/24/2016  Chief Complaint: Lori Le is a 51 y.o. female s/p gastric bypass surgery and subsequent iron deficiency anemia who is seen for 3 month assessment.  HPI:  The patient was last seen in the hematology clinic on 07/25/2016.  At that time, she felt much better after IV iron.  Restless legs were gone.  She denied any melena or hematochezia.  She denied any pica. Hematocrit and hemoglobin were normal.  RBCs were microcytic.  Ferritin was 24. She received Venofer x 2 (07/23/2016 and 08/05/2016).  She underwent right sided rotator cuff surgery on 07/26/2016.  She underwent D&C with ablation on 08/19/2016.  Symptomatically, she is doing well.  Menses has stopped.  She notes some constipation and issues with her shoulder.  She has been "retaining fluid in her feet".      Past Medical History:  Diagnosis Date  . Allergic rhinitis due to allergen   . Allergy   . Anemia   . Arthritis   . Cancer ([place])    skin ca  . Chronic idiopathic urticaria   . Complication of anesthesia    nausea, slow to wake up  . Depression   . GERD (gastroesophageal reflux disease)   . Gout   . Gout   . Hashimoto's thyroiditis   . Heart murmur   . History of eating disorder   . Hives   . Hyperlipidemia   . Hypertension   . Hypothyroidism   . Lower extremity edema   . Melanoma in situ ([place])    left shoulder  . Migraine   . Mitral valve disorder   . Motion sickness    all moving vehicles  . PCOS (polycystic ovarian syndrome)   . PONV (postoperative nausea and vomiting)   . Thyroid disease   . Ulcer   . Vertigo    last episode over 1 yr ago    Past Surgical History:  Procedure Laterality Date  . BREAST BIOPSY Right    neg- bx/clip  . CHOLECYSTECTOMY  2003  . DILITATION & CURRETTAGE/HYSTROSCOPY WITH NOVASURE ABLATION N/A 08/19/2016   Procedure: DILATATION & CURETTAGE/HYSTEROSCOPY WITH NOVASURE ABLATION;  Surgeon: [name],  MD;  Location: [facility] ORS;  Service: Gynecology;  Laterality: N/A;  . ESOPHAGOGASTRODUODENOSCOPY (EGD) WITH PROPOFOL N/A 12/05/2015   Procedure: ESOPHAGOGASTRODUODENOSCOPY (EGD) WITH PROPOFOL;  Surgeon: Lollie Sails, MD;  Location: [facility] ENDOSCOPY;  Service: Endoscopy;  Laterality: N/A;  . ESOPHAGOGASTRODUODENOSCOPY (EGD) WITH PROPOFOL N/A 02/06/2016   Procedure: ESOPHAGOGASTRODUODENOSCOPY (EGD) WITH PROPOFOL;  Surgeon: Lollie Sails, MD;  Location: [facility] ENDOSCOPY;  Service: Endoscopy;  Laterality: N/A;  . FOOT SURGERY  1994  . GASTRIC BYPASS  2011  . JOINT REPLACEMENT Left 09/27/2014   HIP  . JOINT REPLACEMENT Left 12/01/2014   KNEE  . KNEE ARTHROSCOPY Right   . KNEE ARTHROSCOPY Left 03/23/2015   Procedure: ARTHROSCOPY KNEE, partial synovectomy;  Surgeon: Hessie Knows, MD;  Location: [facility] ORS;  Service: Orthopedics;  Laterality: Left;  . LAPAROSCOPIC GASTRIC BANDING  2010  . ROTATOR CUFF REPAIR Bilateral    right shoulder 07-26-2016  . SHOULDER ARTHROSCOPY Left 06/20/2015   Procedure: ARTHROSCOPY SHOULDER, REPAIR OF MASSIVE ROTATOR CUFF TEAR, TENODESIS, DECOMPRESSION DEBRIDEMENT;  Surgeon: Corky Mull, MD;  Location: [facility] ORS;  Service: Orthopedics;  Laterality: Left;  . TOTAL HIP ARTHROPLASTY Left 09/27/2014   Procedure: TOTAL HIP ARTHROPLASTY ANTERIOR APPROACH;  Surgeon: Hessie Knows, MD;  Location: [facility] ORS;  Service:  Orthopedics;  Laterality: Left;  . TOTAL KNEE ARTHROPLASTY Left 12/01/2014   Procedure: TOTAL KNEE ARTHROPLASTY;  Surgeon: Hessie Knows, MD;  Location: [facility] ORS;  Service: Orthopedics;  Laterality: Left;    Family History  Problem Relation Age of Onset  . Arthritis Mother   . Hyperlipidemia Mother   . Hypertension Mother   . Diabetes Mother   . Arthritis Father   . Hyperlipidemia Father   . Hypertension Father   . Mental illness Father   . Diabetes Father   . Arthritis Maternal Grandmother   . Cancer Maternal Grandmother        breast cancer  . Hyperlipidemia  Maternal Grandmother   . Hypertension Maternal Grandmother   . Breast cancer Maternal Grandmother   . Arthritis Maternal Grandfather   . Hyperlipidemia Maternal Grandfather   . Hypertension Maternal Grandfather   . Heart disease Maternal Grandfather        heart attack  . Breast cancer Paternal Grandmother     Social History:  reports that she has never smoked. She has never used smokeless tobacco. She reports that she drinks alcohol. She reports that she does not use drugs.  The patient lives in [place].  The patient is alone today.  Allergies:  Allergies  Allergen Reactions  . Influenza Vaccines Hives  . Lactose Nausea And Vomiting    Can have yogurt Can not have milk and ice cream   . Lactose Intolerance (Gi) Nausea And Vomiting    Can have yogurt Can not have milk and ice cream    . Bacitracin-Neomycin-Polymyxin Rash  . Neomycin-Bacitracin Zn-Polymyx Rash  . Neosporin + Pain Relief Max St [Neomy-Bacit-Polymyx-Pramoxine] Rash    Current Medications: Current Outpatient Prescriptions  Medication Sig Dispense Refill  . acetaminophen (TYLENOL) 650 MG CR tablet Take 1,300 mg by mouth 2 (two) times daily.    . DULoxetine (CYMBALTA) 60 MG capsule Take 1 capsule (60 mg total) by mouth daily. 30 capsule 1  . furosemide (LASIX) 40 MG tablet Take 1 tablet (40 mg total) by mouth daily. 90 tablet 1  . KLOR-CON 10 10 MEQ tablet TAKE ONE TABLET TWICE FOR THREE DAYS AND THEN DAILY. *MAINT REFILLS EXCEEDED* 30 tablet 2  . levothyroxine (SYNTHROID, LEVOTHROID) 137 MCG tablet TAKE ONE TABLET BY MOUTH ONCE DAILY BEFORE BREAKFAST (Patient taking differently: Take 137 mcg by mouth daily before breakfast. DAILY BEFORE BREAKFAST) 90 tablet 0  . pantoprazole (PROTONIX) 40 MG tablet Take 40 mg by mouth daily.     [name] triamterene-hydrochlorothiazide (MAXZIDE-25) 37.5-25 MG tablet Take 1 tablet by mouth daily. 90 tablet 1   No current facility-administered medications for this visit.     Review of  Systems:  GENERAL:  Feels "ok".  No fevers or sweats.  Weight gain of 10 pounds. PERFORMANCE STATUS (ECOG):  0 HEENT:  No visual changes, runny nose, sore throat, mouth sores or tenderness. Lungs: No shortness of breath or cough.  No hemoptysis. Cardiac:  No chest pain, palpitations, orthopnea, or PND. GI:  No nausea, vomiting, diarrhea, constipation, melena or hematochezia. GU:  No urgency, frequency, dysuria, or hematuria. S/p uterine ablation.  No menses. Musculoskeletal:  No back pain.  Interval right rotator cuff surgery.  No muscle tenderness. Extremities:  No pain or swelling. Skin:  No rashes or skin changes. Neuro:  No headache, numbness or weakness, balance or coordination issues.  Restless legs, resolved. Endocrine:  No diabetes.  Thyroid disease on Synthroid.  No hot flashes or night sweats. Psych:  No mood changes, depression or anxiety. Pain:  No focal pain. Review of systems:  All other systems reviewed and found to be negative.  Physical Exam: Blood pressure 139/83, pulse 96, temperature (!) 97.5 F (36.4 C), temperature source Tympanic, resp. rate 18, weight 221 lb 2 oz (100.3 kg). GENERAL:  Well developed, well nourished, tall woman sitting comfortably in the exam room in no acute distress. MENTAL STATUS:  Alert and oriented to person, place and time. HEAD:  Short brown hair.  Normocephalic, atraumatic, face symmetric, no Cushingoid features. EYES:  Blue eyes.  Pupils equal round and reactive to light and accomodation.  No conjunctivitis or scleral icterus. ENT:  Oropharynx clear without lesion.  Tongue normal.  Mucous membranes moist.  RESPIRATORY:  Clear to auscultation without rales, wheezes or rhonchi. CARDIOVASCULAR:  Regular rate and rhythm without murmur, rub or gallop. ABDOMEN:  Soft, non-tender, with active bowel sounds, and no appreciable hepatosplenomegaly.  No masses. SKIN:  No rashes, ulcers or lesions. EXTREMITIES: Bilateral lower extremity edema (left >  right).  No skin discoloration or tenderness.  No palpable cords. LYMPH NODES: No palpable cervical, supraclavicular, axillary or inguinal adenopathy  NEUROLOGICAL: Unremarkable. PSYCH:  Appropriate.   Appointment on 10/24/2016  Component Date Value Ref Range Status  . WBC 10/24/2016 6.6  3.6 - 11.0 K/uL Final  . RBC 10/24/2016 4.65  3.80 - 5.20 MIL/uL Final  . Hemoglobin 10/24/2016 13.7  12.0 - 16.0 g/dL Final  . HCT 10/24/2016 40.1  35.0 - 47.0 % Final  . MCV 10/24/2016 86.2  80.0 - 100.0 fL Final  . MCH 10/24/2016 29.4  26.0 - 34.0 pg Final  . MCHC 10/24/2016 34.1  32.0 - 36.0 g/dL Final  . RDW 10/24/2016 13.8  11.5 - 14.5 % Final  . Platelets 10/24/2016 297  150 - 440 K/uL Final  . Neutrophils Relative % 10/24/2016 62  % Final  . Neutro Abs 10/24/2016 4.1  1.4 - 6.5 K/uL Final  . Lymphocytes Relative 10/24/2016 29  % Final  . Lymphs Abs 10/24/2016 1.9  1.0 - 3.6 K/uL Final  . Monocytes Relative 10/24/2016 6  % Final  . Monocytes Absolute 10/24/2016 0.4  0.2 - 0.9 K/uL Final  . Eosinophils Relative 10/24/2016 2  % Final  . Eosinophils Absolute 10/24/2016 0.1  0 - 0.7 K/uL Final  . Basophils Relative 10/24/2016 1  % Final  . Basophils Absolute 10/24/2016 0.1  0 - 0.1 K/uL Final    Assessment:  Lori Le is a 51 y.o. female s/p gastric bypass surgery (2011) with iron deficiency anemia. She has a history of bleeding ulcer in 10/2015.  She had heavy menses.  She underwent uterine ablation on 08/19/2016.  She bruises easily.  EGD on 12/05/2015 revealed edema and erythema of the gastrojejunal anastomosis. She was treated with Carafate and Protonix.  Repeat EGD on 02/06/2016 revealed minimal erythema on the tips of some gastric folds.  She has never had a colonoscopy.  Diet is good.  She denies any ice pica.  She is intolerant of oral iron secondary to severe constipation.  She has B12 deficiency.  B12 was 168 on 12/29/2012 and 367 on 11/29/2015.  She has not been on B12 in  awhile.  Workup on 03/08/2016 revealed a hematocrit of 30.1, hemoglobin 9.1, MCV 70.7, platelets 331,000, WBC 8200 with an ANC of 5800.  Ferritin was 5.  PT and PTT were normal.  Platelet function assay was normal.  She received Venofer 200 mg  IV weekly x 4 (03/15/2016 - 04/05/2016) and x 2 (07/23/2016 and 08/05/2016).  She receives Venofer if her ferritin is < 30.  She has a family history of breast cancer.  Screening bilateral mammogram on 05/08/2016 revealed no evidence of malignancy.  Symptomatically, she feels "ok".  Exam reveals bilateral lower extremity edma (left > right).  Hematocrit is 40.1.  B12 is 252 (low normal).  Plan: 1.  Labs today:  CBC with diff, ferritin, iron studies, B12, folate. 2.  No Venofer tomorrow. 3.  Bilateral lower extremity duplex. 4.  RTC in 3 months for labs (CBC with diff, ferritin) 5.  RTC in 6 months for MD assessment, labs (CBC with diff, iron studies, ferritin) +/- IV iron.  Addendum:  Bilateral lower extremity duplex today revealed no DVT.   Lequita Asal, MD  10/24/2016

## 2016-10-24 NOTE — Progress Notes (Signed)
Patient recently had right rotator cuff surgery.  Offers no complaints today.

## 2016-10-28 ENCOUNTER — Other Ambulatory Visit: Payer: Self-pay | Admitting: *Deleted

## 2016-10-28 ENCOUNTER — Telehealth: Payer: Self-pay | Admitting: *Deleted

## 2016-10-28 DIAGNOSIS — E538 Deficiency of other specified B group vitamins: Secondary | ICD-10-CM

## 2016-10-28 DIAGNOSIS — D500 Iron deficiency anemia secondary to blood loss (chronic): Secondary | ICD-10-CM

## 2016-10-28 NOTE — Telephone Encounter (Signed)
Called and left patient a message to call the clinic back r/t results of labs.

## 2016-10-31 ENCOUNTER — Telehealth: Payer: Self-pay

## 2016-10-31 ENCOUNTER — Inpatient Hospital Stay: Payer: 59 | Attending: Hematology and Oncology

## 2016-10-31 ENCOUNTER — Inpatient Hospital Stay: Payer: 59

## 2016-10-31 DIAGNOSIS — Z79899 Other long term (current) drug therapy: Secondary | ICD-10-CM | POA: Insufficient documentation

## 2016-10-31 DIAGNOSIS — E538 Deficiency of other specified B group vitamins: Secondary | ICD-10-CM | POA: Insufficient documentation

## 2016-10-31 DIAGNOSIS — D500 Iron deficiency anemia secondary to blood loss (chronic): Secondary | ICD-10-CM

## 2016-10-31 DIAGNOSIS — D509 Iron deficiency anemia, unspecified: Secondary | ICD-10-CM | POA: Insufficient documentation

## 2016-10-31 MED ORDER — CYANOCOBALAMIN 1000 MCG/ML IJ SOLN
1000.0000 ug | Freq: Once | INTRAMUSCULAR | Status: AC
  Administered 2016-10-31: 1000 ug via INTRAMUSCULAR
  Filled 2016-10-31: qty 1

## 2016-10-31 NOTE — Telephone Encounter (Signed)
Patient here today for B12 injection.  She is wanting to know if she is to receive injections monthly.  If so, she will need appointments scheduled.

## 2016-10-31 NOTE — Telephone Encounter (Signed)
We are waiting on her labs from today for Dr. Mike Gip to decide, we will call her with this information.

## 2016-11-03 LAB — METHYLMALONIC ACID, SERUM: Methylmalonic Acid, Quantitative: 121 nmol/L (ref 0–378)

## 2016-11-07 ENCOUNTER — Other Ambulatory Visit: Payer: Self-pay | Admitting: Hematology and Oncology

## 2016-11-13 ENCOUNTER — Encounter: Payer: Self-pay | Admitting: Internal Medicine

## 2016-11-13 DIAGNOSIS — Z760 Encounter for issue of repeat prescription: Secondary | ICD-10-CM

## 2016-11-13 MED ORDER — TRIAMTERENE-HCTZ 37.5-25 MG PO TABS: 1.0000 | ORAL_TABLET | Freq: Every day | ORAL | 0 refills | Status: DC

## 2016-11-13 MED ORDER — POTASSIUM CHLORIDE ER 10 MEQ PO TBCR: EXTENDED_RELEASE_TABLET | ORAL | 2 refills | Status: DC

## 2016-12-04 ENCOUNTER — Encounter: Payer: Self-pay | Admitting: Family Medicine

## 2016-12-04 ENCOUNTER — Ambulatory Visit (INDEPENDENT_AMBULATORY_CARE_PROVIDER_SITE_OTHER): Payer: 59 | Admitting: Family Medicine

## 2016-12-04 ENCOUNTER — Inpatient Hospital Stay: Payer: 59

## 2016-12-04 VITALS — BP 118/64 | HR 76 | Temp 98.7°F | Ht 67.5 in | Wt 227.5 lb

## 2016-12-04 DIAGNOSIS — J0100 Acute maxillary sinusitis, unspecified: Secondary | ICD-10-CM | POA: Diagnosis not present

## 2016-12-04 DIAGNOSIS — J019 Acute sinusitis, unspecified: Secondary | ICD-10-CM | POA: Insufficient documentation

## 2016-12-04 MED ORDER — PREDNISONE 10 MG PO TABS: ORAL_TABLET | ORAL | 0 refills | Status: DC

## 2016-12-04 MED ORDER — AMOXICILLIN-POT CLAVULANATE 875-125 MG PO TABS: 1.0000 | ORAL_TABLET | Freq: Two times a day (BID) | ORAL | 0 refills | Status: DC

## 2016-12-04 NOTE — Patient Instructions (Signed)
Take the augmentin and the prednisone as directed  Breathe steam for congestion  Nasal saline spray or netti pot helps also  Stop the afrin  Start flonase or nasacort over the counter daily   Drink lots of water and rest   Update if not starting to improve in a week or if worsening

## 2016-12-04 NOTE — Progress Notes (Signed)
Subjective:    Patient ID: Lori Le, female    DOB: 03/24/1966, 51 y.o.   MRN: [id number]  HPI Here for nasal congestion with HA and ear pain   Started with a terrible headache all last week  Friday started getting pnd and congestion  Then R ear -pressure and pain that evening   She called MD live service -told poss sinus infection (tx with amox)   Yellow drainage  Also d/c   Using nasal spray- afrin - once daily - for 3 d and then stopped   Hx of severe congestion - esp on R side No sinus surgery  Has large turbinates and big tonsils   Not currently on a nasal steroid   No fever  No ST (just drainage)    Also desires a B12 shot    BP: 118/64  Temp: 98.7 F (37.1 C)   Patient Active Problem List   Diagnosis Date Noted  . Acute sinusitis 12/04/2016  . Bilateral lower extremity edema 10/24/2016  . Laceration of hand 04/08/2016  . Menorrhagia with regular cycle 03/08/2016  . Coffee ground emesis 11/29/2015  . Eustachian tube dysfunction 07/17/2015  . Rotator cuff syndrome 04/24/2015  . Arthritis of knee, degenerative 04/24/2015  . Degenerative arthritis of hip 04/24/2015  . B12 deficiency 01/06/2015  . Primary osteoarthritis of knee 12/01/2014  . Anemia 11/22/2014  . Primary localized osteoarthritis of left hip 09/27/2014  . Allergic rhinitis 07/31/2014  . Obesity (BMI 30-39.9) 03/20/2014  . Gout 03/20/2014  . Excessive and frequent menstruation 08/10/2013  . Joint stiffness 03/26/2013  . Fatigue 12/30/2012  . Edema 12/30/2012  . Essential hypertension, benign 08/02/2012  . Melanoma in situ ([place]) 08/02/2012  . Depression 08/02/2012  . Environmental allergies 08/02/2012  . Hypercholesterolemia 08/02/2012  . Migraines 08/02/2012  . Hypothyroidism 08/02/2012  . PCOS (polycystic ovarian syndrome) 08/02/2012  . GERD (gastroesophageal reflux disease) 08/02/2012  . H/O gastric bypass 08/02/2012   Past Medical History:  Diagnosis Date  . Allergic  rhinitis due to allergen   . Allergy   . Anemia   . Arthritis   . Cancer ([place])    skin ca  . Chronic idiopathic urticaria   . Complication of anesthesia    nausea, slow to wake up  . Depression   . GERD (gastroesophageal reflux disease)   . Gout   . Gout   . Hashimoto's thyroiditis   . Heart murmur   . History of eating disorder   . Hives   . Hyperlipidemia   . Hypertension   . Hypothyroidism   . Lower extremity edema   . Melanoma in situ ([place])    left shoulder  . Migraine   . Mitral valve disorder   . Motion sickness    all moving vehicles  . PCOS (polycystic ovarian syndrome)   . PONV (postoperative nausea and vomiting)   . Thyroid disease   . Ulcer   . Vertigo    last episode over 1 yr ago   Past Surgical History:  Procedure Laterality Date  . BREAST BIOPSY Right    neg- bx/clip  . CHOLECYSTECTOMY  2003  . DILITATION & CURRETTAGE/HYSTROSCOPY WITH NOVASURE ABLATION N/A 08/19/2016   Procedure: DILATATION & CURETTAGE/HYSTEROSCOPY WITH NOVASURE ABLATION;  Surgeon: [name], MD;  Location: [facility];  Service: Gynecology;  Laterality: N/A;  . ESOPHAGOGASTRODUODENOSCOPY (EGD) WITH PROPOFOL N/A 12/05/2015   Procedure: ESOPHAGOGASTRODUODENOSCOPY (EGD) WITH PROPOFOL;  Surgeon: Lollie Sails, MD;  Location: [facility] END[facility]COPY;  Service: Endoscopy;  Laterality: N/A;  . ESOPHAGOGASTRODUODENOSCOPY (EGD) WITH PROPOFOL N/A 02/06/2016   Procedure: ESOPHAGOGASTRODUODENOSCOPY (EGD) WITH PROPOFOL;  Surgeon: Lollie Sails, MD;  Location: [facility];  Service: Endoscopy;  Laterality: N/A;  . FOOT SURGERY  1994  . GASTRIC BYPASS  2011  . JOINT REPLACEMENT Left 09/27/2014   HIP  . JOINT REPLACEMENT Left 12/01/2014   KNEE  . KNEE ARTHROSCOPY Right   . KNEE ARTHROSCOPY Left 03/23/2015   Procedure: ARTHROSCOPY KNEE, partial synovectomy;  Surgeon: Hessie Knows, MD;  Location: [facility];  Service: Orthopedics;  Laterality: Left;  . LAPAROSCOPIC GASTRIC BANDING  2010  . ROTATOR  CUFF REPAIR Bilateral    right shoulder 07-26-2016  . SHOULDER ARTHROSCOPY Left 06/20/2015   Procedure: ARTHROSCOPY SHOULDER, REPAIR OF MASSIVE ROTATOR CUFF TEAR, TENODESIS, DECOMPRESSION DEBRIDEMENT;  Surgeon: Corky Mull, MD;  Location: [facility];  Service: Orthopedics;  Laterality: Left;  . TOTAL HIP ARTHROPLASTY Left 09/27/2014   Procedure: TOTAL HIP ARTHROPLASTY ANTERIOR APPROACH;  Surgeon: Hessie Knows, MD;  Location: [facility];  Service: Orthopedics;  Laterality: Left;  . TOTAL KNEE ARTHROPLASTY Left 12/01/2014   Procedure: TOTAL KNEE ARTHROPLASTY;  Surgeon: Hessie Knows, MD;  Location: [facility];  Service: Orthopedics;  Laterality: Left;   Social History  Substance Use Topics  . Smoking status: Never Smoker  . Smokeless tobacco: Never Used  . Alcohol use 0.0 - 0.6 oz/week     Comment: occ.   Family History  Problem Relation Age of Onset  . Arthritis Mother   . Hyperlipidemia Mother   . Hypertension Mother   . Diabetes Mother   . Arthritis Father   . Hyperlipidemia Father   . Hypertension Father   . Mental illness Father   . Diabetes Father   . Arthritis Maternal Grandmother   . Cancer Maternal Grandmother        breast cancer  . Hyperlipidemia Maternal Grandmother   . Hypertension Maternal Grandmother   . Breast cancer Maternal Grandmother   . Arthritis Maternal Grandfather   . Hyperlipidemia Maternal Grandfather   . Hypertension Maternal Grandfather   . Heart disease Maternal Grandfather        heart attack  . Breast cancer Paternal Grandmother    Allergies  Allergen Reactions  . Influenza Vaccines Hives  . Lactose Nausea And Vomiting    Can have yogurt Can not have milk and ice cream   . Lactose Intolerance (Gi) Nausea And Vomiting    Can have yogurt Can not have milk and ice cream    . Bacitracin-Neomycin-Polymyxin Rash  . Neomycin-Bacitracin Zn-Polymyx Rash  . Neosporin + Pain Relief Max St [Neomy-Bacit-Polymyx-Pramoxine] Rash   Current Outpatient  Prescriptions on File Prior to Visit  Medication Sig Dispense Refill  . acetaminophen (TYLENOL) 650 MG CR tablet Take 1,300 mg by mouth 2 (two) times daily.    . DULoxetine (CYMBALTA) 60 MG capsule Take 1 capsule (60 mg total) by mouth daily. 30 capsule 1  . furosemide (LASIX) 40 MG tablet TAKE 1 TABLET (40 MG TOTAL) BY MOUTH DAILY. 90 tablet 1  . levothyroxine (SYNTHROID, LEVOTHROID) 137 MCG tablet TAKE ONE TABLET BY MOUTH ONCE DAILY BEFORE BREAKFAST (Patient taking differently: Take 137 mcg by mouth daily before breakfast. DAILY BEFORE BREAKFAST) 90 tablet 0  . pantoprazole (PROTONIX) 40 MG tablet Take 40 mg by mouth daily.     . potassium chloride (KLOR-CON 10) 10 MEQ tablet TAKE ONE TABLET TWICE FOR THREE DAYS AND THEN DAILY. *MAINT REFILLS  EXCEEDED* 30 tablet 2  . triamterene-hydrochlorothiazide (MAXZIDE-25) 37.5-25 MG tablet Take 1 tablet by mouth daily. 90 tablet 0   No current facility-administered medications on file prior to visit.     Review of Systems  Constitutional: Positive for appetite change, fatigue and fever.  HENT: Positive for congestion, ear pain, postnasal drip, rhinorrhea, sinus pressure and sore throat. Negative for nosebleeds.   Eyes: Negative for pain, redness and itching.  Respiratory: Positive for cough. Negative for shortness of breath and wheezing.   Cardiovascular: Negative for chest pain.  Gastrointestinal: Negative for abdominal pain, diarrhea, nausea and vomiting.  Endocrine: Negative for polyuria.  Genitourinary: Negative for dysuria, frequency and urgency.  Musculoskeletal: Negative for arthralgias and myalgias.  Allergic/Immunologic: Negative for immunocompromised state.  Neurological: Positive for headaches. Negative for dizziness, tremors, syncope, weakness and numbness.  Hematological: Negative for adenopathy. Does not bruise/bleed easily.  Psychiatric/Behavioral: Negative for dysphoric mood. The patient is not nervous/anxious.        Objective:     Physical Exam  Constitutional: She appears well-developed and well-nourished. No distress.  Obese and well app  HENT:  Head: Normocephalic and atraumatic.  Right Ear: External ear normal.  Left Ear: External ear normal.  Mouth/Throat: Oropharynx is clear and moist. No oropharyngeal exudate.  Nares are injected and congested (worse on the R) Bilateral maxillary sinus tenderness (worse on the R) Post nasal drip   Eyes: Pupils are equal, round, and reactive to light. Conjunctivae and EOM are normal. Right eye exhibits no discharge. Left eye exhibits no discharge.  Neck: Normal range of motion. Neck supple.  Cardiovascular: Normal rate and regular rhythm.   Pulmonary/Chest: Effort normal and breath sounds normal. No respiratory distress. She has no wheezes. She has no rales.  Lymphadenopathy:    She has no cervical adenopathy.  Neurological: She is alert. No cranial nerve deficit.  Skin: Skin is warm and dry. No rash noted. No pallor.  Psychiatric: She has a normal mood and affect.          Assessment & Plan:   Problem List Items Addressed This Visit      Respiratory   Acute sinusitis    Persistent R maxillary after tx with amox  In pt with hx of severe chronic congestion  tx with augmentin  pred taper 30 mg for congestion  Adv starting back on steroid ns regularly  Saline /rest/ fluids Disc symptomatic care - see instructions on AVS  Update if not starting to improve in a week or if worsening        Relevant Medications   predniSONE (DELTASONE) 10 MG tablet   amoxicillin-clavulanate (AUGMENTIN) 875-125 MG tablet

## 2016-12-05 ENCOUNTER — Inpatient Hospital Stay: Payer: 59 | Attending: Hematology and Oncology

## 2016-12-05 VITALS — BP 119/74 | HR 82 | Temp 97.8°F | Resp 20

## 2016-12-05 DIAGNOSIS — D509 Iron deficiency anemia, unspecified: Secondary | ICD-10-CM | POA: Insufficient documentation

## 2016-12-05 DIAGNOSIS — E538 Deficiency of other specified B group vitamins: Secondary | ICD-10-CM | POA: Insufficient documentation

## 2016-12-05 DIAGNOSIS — D500 Iron deficiency anemia secondary to blood loss (chronic): Secondary | ICD-10-CM

## 2016-12-05 DIAGNOSIS — Z79899 Other long term (current) drug therapy: Secondary | ICD-10-CM | POA: Insufficient documentation

## 2016-12-05 MED ORDER — CYANOCOBALAMIN 1000 MCG/ML IJ SOLN
1000.0000 ug | Freq: Once | INTRAMUSCULAR | Status: AC
  Administered 2016-12-05: 1000 ug via INTRAMUSCULAR
  Filled 2016-12-05: qty 1

## 2016-12-05 NOTE — Assessment & Plan Note (Signed)
Persistent R maxillary after tx with amox  In pt with hx of severe chronic congestion  tx with augmentin  pred taper 30 mg for congestion  Adv starting back on steroid ns regularly  Saline /rest/ fluids Disc symptomatic care - see instructions on AVS  Update if not starting to improve in a week or if worsening

## 2016-12-12 ENCOUNTER — Encounter: Payer: Self-pay | Admitting: Internal Medicine

## 2016-12-12 ENCOUNTER — Ambulatory Visit (INDEPENDENT_AMBULATORY_CARE_PROVIDER_SITE_OTHER): Payer: 59 | Admitting: Internal Medicine

## 2016-12-12 DIAGNOSIS — F329 Major depressive disorder, single episode, unspecified: Secondary | ICD-10-CM

## 2016-12-12 DIAGNOSIS — R609 Edema, unspecified: Secondary | ICD-10-CM

## 2016-12-12 DIAGNOSIS — Z9884 Bariatric surgery status: Secondary | ICD-10-CM | POA: Diagnosis not present

## 2016-12-12 DIAGNOSIS — F32A Depression, unspecified: Secondary | ICD-10-CM

## 2016-12-12 DIAGNOSIS — D039 Melanoma in situ, unspecified: Secondary | ICD-10-CM | POA: Diagnosis not present

## 2016-12-12 DIAGNOSIS — Z6834 Body mass index (BMI) 34.0-34.9, adult: Secondary | ICD-10-CM

## 2016-12-12 DIAGNOSIS — K219 Gastro-esophageal reflux disease without esophagitis: Secondary | ICD-10-CM | POA: Diagnosis not present

## 2016-12-12 DIAGNOSIS — E282 Polycystic ovarian syndrome: Secondary | ICD-10-CM | POA: Diagnosis not present

## 2016-12-12 DIAGNOSIS — I10 Essential (primary) hypertension: Secondary | ICD-10-CM | POA: Diagnosis not present

## 2016-12-12 DIAGNOSIS — E039 Hypothyroidism, unspecified: Secondary | ICD-10-CM | POA: Diagnosis not present

## 2016-12-12 DIAGNOSIS — E7800 Pure hypercholesterolemia, unspecified: Secondary | ICD-10-CM

## 2016-12-12 MED ORDER — PANTOPRAZOLE SODIUM 40 MG PO TBEC: 40.0000 mg | DELAYED_RELEASE_TABLET | Freq: Every day | ORAL | 2 refills | Status: DC

## 2016-12-12 NOTE — Progress Notes (Signed)
Patient ID: Lori Le, female   DOB: [birth year], 51 y.o.   MRN: [id number]   Subjective:    Patient ID: Lori Le, female    DOB: [birth year], 52 y.o.   MRN: [id number]  HPI  Patient here for a scheduled follow up.  She is still living with her parents. Separated from her husband.  Discussed with her today.  She feels she is handling things relatively well.  Tries to stay active.  No chest pain.  No sob.  No acid reflux.  She is seeing psychiatry.  On cymbalta.  Stable.  Has been going to physical therapy for her right shoulder issues.  Recently evaluated for sinusitis.  Treated.  Feeling better.  Overall she feels things are stable.     Past Medical History:  Diagnosis Date  . Allergic rhinitis due to allergen   . Allergy   . Anemia   . Arthritis   . Cancer ([place])    skin ca  . Chronic idiopathic urticaria   . Complication of anesthesia    nausea, slow to wake up  . Depression   . GERD (gastroesophageal reflux disease)   . Gout   . Gout   . Hashimoto's thyroiditis   . Heart murmur   . History of eating disorder   . Hives   . Hyperlipidemia   . Hypertension   . Hypothyroidism   . Lower extremity edema   . Melanoma in situ ([place])    left shoulder  . Migraine   . Mitral valve disorder   . Motion sickness    all moving vehicles  . PCOS (polycystic ovarian syndrome)   . PONV (postoperative nausea and vomiting)   . Thyroid disease   . Ulcer   . Vertigo    last episode over 1 yr ago   Past Surgical History:  Procedure Laterality Date  . BREAST BIOPSY Right    neg- bx/clip  . CHOLECYSTECTOMY  2003  . DILITATION & CURRETTAGE/HYSTROSCOPY WITH NOVASURE ABLATION N/A 08/19/2016   Procedure: DILATATION & CURETTAGE/HYSTEROSCOPY WITH NOVASURE ABLATION;  Surgeon: [name], MD;  Location: [facility];  Service: Gynecology;  Laterality: N/A;  . ESOPHAGOGASTRODUODENOSCOPY (EGD) WITH PROPOFOL N/A 12/05/2015   Procedure: ESOPHAGOGASTRODUODENOSCOPY (EGD) WITH PROPOFOL;  Surgeon:  Lollie Sails, MD;  Location: [facility] ENDOSCOPY;  Service: Endoscopy;  Laterality: N/A;  . ESOPHAGOGASTRODUODENOSCOPY (EGD) WITH PROPOFOL N/A 02/06/2016   Procedure: ESOPHAGOGASTRODUODENOSCOPY (EGD) WITH PROPOFOL;  Surgeon: Lollie Sails, MD;  Location: [facility] ENDOSCOPY;  Service: Endoscopy;  Laterality: N/A;  . FOOT SURGERY  1994  . GASTRIC BYPASS  2011  . JOINT REPLACEMENT Left 09/27/2014   HIP  . JOINT REPLACEMENT Left 12/01/2014   KNEE  . KNEE ARTHROSCOPY Right   . KNEE ARTHROSCOPY Left 03/23/2015   Procedure: ARTHROSCOPY KNEE, partial synovectomy;  Surgeon: Hessie Knows, MD;  Location: [facility] OR[facility];  Service: Orthopedics;  Laterality: Left;  . LAPAROSCOPIC GASTRIC BANDING  2010  . ROTATOR CUFF REPAIR Bilateral    right shoulder 07-26-2016  . SHOULDER ARTHROSCOPY Left 06/20/2015   Procedure: ARTHROSCOPY SHOULDER, REPAIR OF MASSIVE ROTATOR CUFF TEAR, TENODESIS, DECOMPRESSION DEBRIDEMENT;  Surgeon: Corky Mull, MD;  Location: [facility] OR[facility];  Service: Orthopedics;  Laterality: Left;  . TOTAL HIP ARTHROPLASTY Left 09/27/2014   Procedure: TOTAL HIP ARTHROPLASTY ANTERIOR APPROACH;  Surgeon: Hessie Knows, MD;  Location: [facility];  Service: Orthopedics;  Laterality: Left;  . TOTAL KNEE ARTHROPLASTY Left 12/01/2014   Procedure: TOTAL KNEE ARTHROPLASTY;  Surgeon: Hessie Knows,  MD;  Location: [facility] OR[facility];  Service: Orthopedics;  Laterality: Left;   Family History  Problem Relation Age of Onset  . Arthritis Mother   . Hyperlipidemia Mother   . Hypertension Mother   . Diabetes Mother   . Arthritis Father   . Hyperlipidemia Father   . Hypertension Father   . Mental illness Father   . Diabetes Father   . Arthritis Maternal Grandmother   . Cancer Maternal Grandmother        breast cancer  . Hyperlipidemia Maternal Grandmother   . Hypertension Maternal Grandmother   . Breast cancer Maternal Grandmother   . Arthritis Maternal Grandfather   . Hyperlipidemia Maternal Grandfather   . Hypertension Maternal  Grandfather   . Heart disease Maternal Grandfather        heart attack  . Breast cancer Paternal Grandmother    Social History   Social History  . Marital status: Legally Separated    Spouse name: N/A  . Number of children: N/A  . Years of education: N/A   Social History Main Topics  . Smoking status: Never Smoker  . Smokeless tobacco: Never Used  . Alcohol use 0.0 - 0.6 oz/week     Comment: occ.  . Drug use: No  . Sexual activity: Yes    Birth control/ protection: Surgical     Comment: Ablation    Other Topics Concern  . None   Social History Narrative  . None    Outpatient Encounter Prescriptions as of 12/12/2016  Medication Sig  . acetaminophen (TYLENOL) 650 MG CR tablet Take 1,300 mg by mouth 2 (two) times daily.  [name] amoxicillin-clavulanate (AUGMENTIN) 875-125 MG tablet Take 1 tablet by mouth 2 (two) times daily.  . DULoxetine (CYMBALTA) 60 MG capsule Take 1 capsule (60 mg total) by mouth daily.  . furosemide (LASIX) 40 MG tablet TAKE 1 TABLET (40 MG TOTAL) BY MOUTH DAILY.  [name] levothyroxine (SYNTHROID, LEVOTHROID) 137 MCG tablet TAKE ONE TABLET BY MOUTH ONCE DAILY BEFORE BREAKFAST (Patient taking differently: Take 137 mcg by mouth daily before breakfast. DAILY BEFORE BREAKFAST)  . pantoprazole (PROTONIX) 40 MG tablet Take 1 tablet (40 mg total) by mouth daily.  . potassium chloride (KLOR-CON 10) 10 MEQ tablet TAKE ONE TABLET TWICE FOR THREE DAYS AND THEN DAILY. *MAINT REFILLS EXCEEDED*  . predniSONE (DELTASONE) 10 MG tablet Take 3 pills once daily by mouth for 3 days, then 2 pills once daily for 3 days, then 1 pill once daily for 3 days and then stop  . triamterene-hydrochlorothiazide (MAXZIDE-25) 37.5-25 MG tablet Take 1 tablet by mouth daily.  . [DISCONTINUED] pantoprazole (PROTONIX) 40 MG tablet Take 40 mg by mouth daily.    No facility-administered encounter medications on file as of 12/12/2016.     Review of Systems  Constitutional: Negative for appetite change and  unexpected weight change.  HENT: Negative for sore throat.        Sinus symptoms improved.    Respiratory: Negative for cough, chest tightness and shortness of breath.   Cardiovascular: Negative for chest pain and palpitations.       Has intermittent leg swelling.    Gastrointestinal: Negative for abdominal pain, diarrhea, nausea and vomiting.  Genitourinary: Negative for difficulty urinating and dysuria.  Musculoskeletal: Negative for joint swelling and myalgias.  Skin: Negative for color change and rash.  Neurological: Negative for dizziness, light-headedness and headaches.  Psychiatric/Behavioral: Negative for agitation and dysphoric mood.       Objective:  Physical Exam  Constitutional: She appears well-developed and well-nourished. No distress.  HENT:  Nose: Nose normal.  Mouth/Throat: Oropharynx is clear and moist.  Neck: Neck supple. No thyromegaly present.  Cardiovascular: Normal rate and regular rhythm.   Pulmonary/Chest: Breath sounds normal. No respiratory distress. She has no wheezes.  Abdominal: Soft. Bowel sounds are normal. There is no tenderness.  Musculoskeletal: She exhibits no edema or tenderness.  Lymphadenopathy:    She has no cervical adenopathy.  Skin: No rash noted. No erythema.  Psychiatric: She has a normal mood and affect. Her behavior is normal.    BP 136/78 (BP Location: Left Arm, Patient Position: Sitting, Cuff Size: Large)   Pulse 81   Temp 98.4 F (36.9 C) (Oral)   Resp 20   Ht 5' 7.32" (1.71 m)   Wt 220 lb (99.8 kg)   SpO2 97%   BMI 34.13 kg/m  Wt Readings from Last 3 Encounters:  12/12/16 220 lb (99.8 kg)  12/04/16 227 lb 8 oz (103.2 kg)  10/24/16 221 lb 2 oz (100.3 kg)     Lab Results  Component Value Date   WBC 6.6 10/24/2016   HGB 13.7 10/24/2016   HCT 40.1 10/24/2016   PLT 297 10/24/2016   GLUCOSE 84 08/19/2016   CHOL 185 01/23/2016   TRIG 173.0 (H) 01/23/2016   HDL 57.80 01/23/2016   LDLDIRECT 121.0 11/16/2014    LDLCALC 93 01/23/2016   ALT 15 11/28/2015   AST 11 11/28/2015   NA 138 08/19/2016   K 3.3 (L) 08/19/2016   CL 100 01/23/2016   CREATININE 0.71 01/23/2016   BUN 14 01/23/2016   CO2 30 01/23/2016   TSH 1.40 02/28/2015   INR 0.99 03/08/2016   HGBA1C 5.4 04/07/2015    US Venous Img Lower Bilateral  Result Date: 10/24/2016 CLINICAL DATA:  Bilateral lower extremity edema for 3 weeks. EXAM: BILATERAL LOWER EXTREMITY VENOUS DOPPLER ULTRASOUND TECHNIQUE: Gray-scale sonography with graded compression, as well as color Doppler and duplex ultrasound were performed to evaluate the lower extremity deep venous systems from the level of the common femoral vein and including the common femoral, femoral, profunda femoral, popliteal and calf veins including the posterior tibial, peroneal and gastrocnemius veins when visible. The superficial great saphenous vein was also interrogated. Spectral Doppler was utilized to evaluate flow at rest and with distal augmentation maneuvers in the common femoral, femoral and popliteal veins. COMPARISON:  None. FINDINGS: RIGHT LOWER EXTREMITY Common Femoral Vein: No evidence of thrombus. Normal compressibility, respiratory phasicity and response to augmentation. Saphenofemoral Junction: No evidence of thrombus. Normal compressibility and flow on color Doppler imaging. Profunda Femoral Vein: No evidence of thrombus. Normal compressibility and flow on color Doppler imaging. Femoral Vein: No evidence of thrombus. Normal compressibility, respiratory phasicity and response to augmentation. Popliteal Vein: No evidence of thrombus. Normal compressibility, respiratory phasicity and response to augmentation. Calf Veins: The right perineum veins not well visualized. Posterior tibial vein demonstrate no evidence of deep venous thrombosis. Superficial Great Saphenous Vein: No evidence of thrombus. Normal compressibility and flow on color Doppler imaging. Venous Reflux:  None. Other Findings:   None. LEFT LOWER EXTREMITY Common Femoral Vein: No evidence of thrombus. Normal compressibility, respiratory phasicity and response to augmentation. Saphenofemoral Junction: No evidence of thrombus. Normal compressibility and flow on color Doppler imaging. Profunda Femoral Vein: No evidence of thrombus. Normal compressibility and flow on color Doppler imaging. Femoral Vein: No evidence of thrombus. Normal compressibility, respiratory phasicity and response to augmentation. Popliteal Vein:  No evidence of thrombus. Normal compressibility, respiratory phasicity and response to augmentation. Calf Veins: No evidence of thrombus. Normal compressibility and flow on color Doppler imaging. Superficial Great Saphenous Vein: No evidence of thrombus. Normal compressibility and flow on color Doppler imaging. Venous Reflux:  None. Other Findings:  None. IMPRESSION: No evidence of DVT within either lower extremity. Electronically Signed   By: Abelardo Diesel M.D.   On: 10/24/2016 17:03       Assessment & Plan:   Problem List Items Addressed This Visit    BMI 34.0-34.9,adult    Discussed diet and exercise.  Follow.        Depression    On cymbalta.  Seeing psychiatry.  Stable.        Edema    Swelling better.  Follow.        Essential hypertension, benign    Blood pressure under good control.  Continue same medication regimen.  Follow pressures.  Follow metabolic panel.        Relevant Orders   Basic metabolic panel   GERD (gastroesophageal reflux disease)    Controlled on protonix.        Relevant Medications   pantoprazole (PROTONIX) 40 MG tablet   H/O gastric bypass    Seeing hematology.  Receives iron infusions.  Follow cbc.        Hypercholesterolemia    Low cholesterol diet and exercise.  Follow lipid panel.       Relevant Orders   Hepatic function panel   Lipid panel   Hypothyroidism    On thyroid replacement.  Follow tsh.        Relevant Orders   TSH   Melanoma in situ ([place])     Followed by dermatology.        PCOS (polycystic ovarian syndrome)    Followed by gyn. Stable.            Einar Pheasant, MD

## 2016-12-14 ENCOUNTER — Encounter: Payer: Self-pay | Admitting: Internal Medicine

## 2016-12-14 NOTE — Assessment & Plan Note (Signed)
Discussed diet and exercise.  Follow.  

## 2016-12-14 NOTE — Assessment & Plan Note (Signed)
Swelling better.  Follow.   

## 2016-12-14 NOTE — Assessment & Plan Note (Signed)
Low cholesterol diet and exercise.  Follow lipid panel.   

## 2016-12-14 NOTE — Assessment & Plan Note (Signed)
On cymbalta.  Seeing psychiatry.  Stable.

## 2016-12-14 NOTE — Assessment & Plan Note (Signed)
Seeing hematology.  Receives iron infusions.  Follow cbc.

## 2016-12-14 NOTE — Assessment & Plan Note (Signed)
Followed by gyn. Stable.

## 2016-12-14 NOTE — Assessment & Plan Note (Signed)
Controlled on protonix.   

## 2016-12-14 NOTE — Assessment & Plan Note (Signed)
On thyroid replacement.  Follow tsh.  

## 2016-12-14 NOTE — Assessment & Plan Note (Signed)
Blood pressure under good control.  Continue same medication regimen.  Follow pressures.  Follow metabolic panel.   

## 2016-12-14 NOTE — Assessment & Plan Note (Signed)
Followed by dermatology

## 2016-12-18 ENCOUNTER — Ambulatory Visit (INDEPENDENT_AMBULATORY_CARE_PROVIDER_SITE_OTHER): Payer: 59 | Admitting: Family Medicine

## 2016-12-18 ENCOUNTER — Encounter: Payer: Self-pay | Admitting: Family Medicine

## 2016-12-18 DIAGNOSIS — J020 Streptococcal pharyngitis: Secondary | ICD-10-CM

## 2016-12-18 LAB — POCT RAPID STREP A (OFFICE): RAPID STREP A SCREEN: POSITIVE — AB

## 2016-12-18 MED ORDER — CEFDINIR 300 MG PO CAPS: 300.0000 mg | ORAL_CAPSULE | Freq: Two times a day (BID) | ORAL | 0 refills | Status: DC

## 2016-12-18 NOTE — Assessment & Plan Note (Signed)
New problem. Rapid strep faintly positive. Treating with Omnicef (patient has had recent courses of amoxicillin and Augmentin; therefore, I chose a third-generation cephalosporin).

## 2016-12-18 NOTE — Progress Notes (Signed)
Subjective:  Patient ID: Lori Le, female    DOB: 1966-01-05  Age: 51 y.o. MRN: [id number]  CC: Sore throat  HPI:  51 year old female presents with the above complaint.  Patient reports that she awoke this morning with severe sore throat. She's had chills but no fever. She has recently been sick with sinusitis. No reported sick contacts although she works closely with her coworkers. Patient states that she looked in the back of her throat and noticed a lot of exudate. Her neck is tender as well. No medications or interventions tried. No other associated symptoms. No other complaints or concerns at this time.  Social Hx   Social History   Social History  . Marital status: Legally Separated    Spouse name: N/A  . Number of children: N/A  . Years of education: N/A   Social History Main Topics  . Smoking status: Never Smoker  . Smokeless tobacco: Never Used  . Alcohol use 0.0 - 0.6 oz/week     Comment: occ.  . Drug use: No  . Sexual activity: Yes    Birth control/ protection: Surgical     Comment: Ablation    Other Topics Concern  . None   Social History Narrative  . None    Review of Systems  Constitutional: Positive for chills. Negative for fever.  HENT: Positive for sore throat.        Recent sinusitis.   Objective:  BP 118/88 (BP Location: Left Arm, Patient Position: Sitting, Cuff Size: Large)   Pulse 87   Temp 98.7 F (37.1 C) (Oral)   Resp 16   Wt 224 lb 8 oz (101.8 kg)   SpO2 98%   BMI 34.83 kg/m   BP/Weight 12/18/2016 09/29/1599 0/11/3233  Systolic BP 573 220 254  Diastolic BP 88 78 74  Wt. (Lbs) 224.5 220 -  BMI 34.83 34.13 -  Some encounter information is confidential and restricted. Go to Review Flowsheets activity to see all data.    Physical Exam  Constitutional: She is oriented to person, place, and time. She appears well-developed. No distress.  HENT:  Head: Normocephalic and atraumatic.  Oropharynx - erythema noted. Tonsillar  exudate noted bilaterally.  Cardiovascular: Normal rate and regular rhythm.   Pulmonary/Chest: Effort normal. She has no wheezes.  Neurological: She is alert and oriented to person, place, and time.  Psychiatric: She has a normal mood and affect.  Vitals reviewed.   Lab Results  Component Value Date   WBC 6.6 10/24/2016   HGB 13.7 10/24/2016   HCT 40.1 10/24/2016   PLT 297 10/24/2016   GLUCOSE 84 08/19/2016   CHOL 185 01/23/2016   TRIG 173.0 (H) 01/23/2016   HDL 57.80 01/23/2016   LDLDIRECT 121.0 11/16/2014   LDLCALC 93 01/23/2016   ALT 15 11/28/2015   AST 11 11/28/2015   NA 138 08/19/2016   K 3.3 (L) 08/19/2016   CL 100 01/23/2016   CREATININE 0.71 01/23/2016   BUN 14 01/23/2016   CO2 30 01/23/2016   TSH 1.40 02/28/2015   INR 0.99 03/08/2016   HGBA1C 5.4 04/07/2015    Assessment & Plan:   Problem List Items Addressed This Visit    Pharyngitis due to Streptococcus species    New problem. Rapid strep faintly positive. Treating with Omnicef (patient has had recent courses of amoxicillin and Augmentin; therefore, I chose a third-generation cephalosporin).         Relevant Medications   cefdinir (OMNICEF)  300 MG capsule   Other Relevant Orders   POCT rapid strep A (Completed)      Meds ordered this encounter  Medications  . cefdinir (OMNICEF) 300 MG capsule    Sig: Take 1 capsule (300 mg total) by mouth 2 (two) times daily.    Dispense:  20 capsule    Refill:  0   Follow-up: PRN  [place]

## 2016-12-18 NOTE — Patient Instructions (Signed)
Antibiotic as prescribed.  Take care  Dr. Anndrea Mihelich  

## 2016-12-19 ENCOUNTER — Other Ambulatory Visit: Payer: Self-pay

## 2016-12-19 ENCOUNTER — Encounter: Payer: Self-pay | Admitting: Internal Medicine

## 2016-12-24 ENCOUNTER — Ambulatory Visit (INDEPENDENT_AMBULATORY_CARE_PROVIDER_SITE_OTHER): Payer: 59 | Admitting: Psychiatry

## 2016-12-24 ENCOUNTER — Encounter (HOSPITAL_COMMUNITY): Payer: Self-pay | Admitting: Psychiatry

## 2016-12-24 DIAGNOSIS — Z818 Family history of other mental and behavioral disorders: Secondary | ICD-10-CM | POA: Diagnosis not present

## 2016-12-24 DIAGNOSIS — F331 Major depressive disorder, recurrent, moderate: Secondary | ICD-10-CM | POA: Diagnosis not present

## 2016-12-24 MED ORDER — DULOXETINE HCL 60 MG PO CPEP: 60.0000 mg | ORAL_CAPSULE | Freq: Every day | ORAL | 0 refills | Status: DC
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
## 2016-12-26 ENCOUNTER — Other Ambulatory Visit (INDEPENDENT_AMBULATORY_CARE_PROVIDER_SITE_OTHER): Payer: 59

## 2016-12-26 DIAGNOSIS — I10 Essential (primary) hypertension: Secondary | ICD-10-CM

## 2016-12-26 DIAGNOSIS — E7800 Pure hypercholesterolemia, unspecified: Secondary | ICD-10-CM

## 2016-12-26 DIAGNOSIS — E039 Hypothyroidism, unspecified: Secondary | ICD-10-CM

## 2016-12-26 LAB — LIPID PANEL
CHOL/HDL RATIO: 4
Cholesterol: 183 mg/dL (ref 0–200)
HDL: 45.8 mg/dL (ref >39.00)
LDL Cholesterol: 97 mg/dL (ref 0–99)
NONHDL: 136.83
Triglycerides: 198 mg/dL — ABNORMAL HIGH (ref 0.0–149.0)
VLDL: 39.6 mg/dL (ref 0.0–40.0)

## 2016-12-26 LAB — BASIC METABOLIC PANEL
BUN: 11 mg/dL (ref 6–23)
CHLORIDE: 103 meq/L (ref 96–112)
CO2: 32 meq/L (ref 19–32)
CREATININE: 0.64 mg/dL (ref 0.40–1.20)
Calcium: 8.8 mg/dL (ref 8.4–10.5)
GFR: 104 mL/min (ref >60.00)
Glucose, Bld: 105 mg/dL — ABNORMAL HIGH (ref 70–99)
POTASSIUM: 4.1 meq/L (ref 3.5–5.1)
Sodium: 140 mEq/L (ref 135–145)

## 2016-12-26 LAB — HEPATIC FUNCTION PANEL
ALBUMIN: 3.7 g/dL (ref 3.5–5.2)
ALK PHOS: 42 U/L (ref 39–117)
ALT: 10 U/L (ref 0–35)
AST: 8 U/L (ref 0–37)
BILIRUBIN DIRECT: 0.1 mg/dL (ref 0.0–0.3)
TOTAL PROTEIN: 6.1 g/dL (ref 6.0–8.3)
Total Bilirubin: 0.5 mg/dL (ref 0.2–1.2)

## 2016-12-26 LAB — TSH: TSH: 0.54 u[IU]/mL (ref 0.35–4.50)

## 2016-12-30 ENCOUNTER — Encounter: Payer: Self-pay | Admitting: Internal Medicine

## 2017-01-02 NOTE — Telephone Encounter (Signed)
Hard copy mailed  

## 2017-01-24 ENCOUNTER — Inpatient Hospital Stay: Payer: 59 | Attending: Hematology and Oncology

## 2017-01-24 ENCOUNTER — Inpatient Hospital Stay: Payer: 59

## 2017-01-24 ENCOUNTER — Other Ambulatory Visit: Payer: Self-pay | Admitting: Hematology and Oncology

## 2017-01-24 DIAGNOSIS — R600 Localized edema: Secondary | ICD-10-CM

## 2017-01-24 DIAGNOSIS — Z79899 Other long term (current) drug therapy: Secondary | ICD-10-CM | POA: Insufficient documentation

## 2017-01-24 DIAGNOSIS — D500 Iron deficiency anemia secondary to blood loss (chronic): Secondary | ICD-10-CM | POA: Diagnosis not present

## 2017-01-24 DIAGNOSIS — E538 Deficiency of other specified B group vitamins: Secondary | ICD-10-CM | POA: Diagnosis not present

## 2017-01-24 LAB — CBC WITH DIFFERENTIAL/PLATELET
Basophils Absolute: 0.1 10*3/uL (ref 0–0.1)
Basophils Relative: 1 %
Eosinophils Absolute: 0.1 10*3/uL (ref 0–0.7)
Eosinophils Relative: 1 %
HCT: 37.5 % (ref 35.0–47.0)
Hemoglobin: 12.3 g/dL (ref 12.0–16.0)
Lymphocytes Relative: 23 %
Lymphs Abs: 2 10*3/uL (ref 1.0–3.6)
MCH: 28.2 pg (ref 26.0–34.0)
MCHC: 32.9 g/dL (ref 32.0–36.0)
MCV: 85.7 fL (ref 80.0–100.0)
Monocytes Absolute: 0.7 10*3/uL (ref 0.2–0.9)
Monocytes Relative: 8 %
Neutro Abs: 6 10*3/uL (ref 1.4–6.5)
Neutrophils Relative %: 67 %
Platelets: 315 10*3/uL (ref 150–440)
RBC: 4.38 MIL/uL (ref 3.80–5.20)
RDW: 13.4 % (ref 11.5–14.5)
WBC: 8.9 10*3/uL (ref 3.6–11.0)

## 2017-01-24 LAB — FERRITIN: Ferritin: 8 ng/mL — ABNORMAL LOW (ref 11–307)

## 2017-01-24 MED ORDER — CYANOCOBALAMIN 1000 MCG/ML IJ SOLN
1000.0000 ug | Freq: Once | INTRAMUSCULAR | Status: AC
  Administered 2017-01-24: 1000 ug via INTRAMUSCULAR

## 2017-01-29 ENCOUNTER — Inpatient Hospital Stay: Payer: 59

## 2017-01-29 VITALS — BP 117/75 | HR 84

## 2017-01-29 DIAGNOSIS — D500 Iron deficiency anemia secondary to blood loss (chronic): Secondary | ICD-10-CM | POA: Diagnosis not present

## 2017-01-29 MED ORDER — IRON SUCROSE 20 MG/ML IV SOLN
200.0000 mg | Freq: Once | INTRAVENOUS | Status: AC
  Administered 2017-01-29: 200 mg via INTRAVENOUS
  Filled 2017-01-29: qty 10

## 2017-01-29 MED ORDER — SODIUM CHLORIDE 0.9 % IV SOLN
Freq: Once | INTRAVENOUS | Status: AC
  Administered 2017-01-29: 14:00:00 via INTRAVENOUS
  Filled 2017-01-29: qty 1000

## 2017-02-03 ENCOUNTER — Other Ambulatory Visit: Payer: Self-pay

## 2017-02-03 ENCOUNTER — Encounter: Payer: Self-pay | Admitting: Emergency Medicine

## 2017-02-03 ENCOUNTER — Emergency Department
Admission: EM | Admit: 2017-02-03 | Discharge: 2017-02-03 | Disposition: A | Discharge status: Home / Self Care | Payer: 59 | Attending: Student in an Organized Health Care Education/Training Program | Admitting: Student in an Organized Health Care Education/Training Program

## 2017-02-03 DIAGNOSIS — I10 Essential (primary) hypertension: Secondary | ICD-10-CM | POA: Insufficient documentation

## 2017-02-03 DIAGNOSIS — Z79899 Other long term (current) drug therapy: Secondary | ICD-10-CM | POA: Diagnosis not present

## 2017-02-03 DIAGNOSIS — E039 Hypothyroidism, unspecified: Secondary | ICD-10-CM | POA: Diagnosis not present

## 2017-02-03 DIAGNOSIS — L509 Urticaria, unspecified: Secondary | ICD-10-CM | POA: Diagnosis present

## 2017-02-03 DIAGNOSIS — T7840XA Allergy, unspecified, initial encounter: Secondary | ICD-10-CM

## 2017-02-03 MED ORDER — EPINEPHRINE 0.3 MG/0.3ML IJ SOAJ
INTRAMUSCULAR | Status: AC
  Administered 2017-02-03: 0.3 mg via INTRAMUSCULAR
  Filled 2017-02-03: qty 0.3

## 2017-02-03 MED ORDER — EPINEPHRINE 0.3 MG/0.3ML IJ SOAJ: 0.3000 mg | Freq: Once | INTRAMUSCULAR | 1 refills | Status: AC

## 2017-02-03 MED ORDER — METHYLPREDNISOLONE SODIUM SUCC 125 MG IJ SOLR
INTRAMUSCULAR | Status: AC
  Filled 2017-02-03: qty 2

## 2017-02-03 MED ORDER — FAMOTIDINE IN NACL 20-0.9 MG/50ML-% IV SOLN
INTRAVENOUS | Status: AC
  Filled 2017-02-03: qty 50

## 2017-02-03 MED ORDER — METHYLPREDNISOLONE SODIUM SUCC 125 MG IJ SOLR
125.0000 mg | Freq: Once | INTRAMUSCULAR | Status: AC
  Administered 2017-02-03: 125 mg via INTRAVENOUS

## 2017-02-03 MED ORDER — DIPHENHYDRAMINE HCL 50 MG/ML IJ SOLN
25.0000 mg | Freq: Once | INTRAMUSCULAR | Status: AC
  Administered 2017-02-03: 25 mg via INTRAVENOUS

## 2017-02-03 MED ORDER — PREDNISONE 10 MG PO TABS: 10.0000 mg | ORAL_TABLET | Freq: Every day | ORAL | 0 refills | Status: DC

## 2017-02-03 MED ORDER — EPINEPHRINE 0.3 MG/0.3ML IJ SOAJ
0.3000 mg | Freq: Once | INTRAMUSCULAR | Status: AC
  Administered 2017-02-03: 0.3 mg via INTRAMUSCULAR

## 2017-02-03 MED ORDER — FAMOTIDINE IN NACL 20-0.9 MG/50ML-% IV SOLN
20.0000 mg | Freq: Once | INTRAVENOUS | Status: AC
  Administered 2017-02-03: 20 mg via INTRAVENOUS

## 2017-02-03 MED ORDER — DIPHENHYDRAMINE HCL 50 MG/ML IJ SOLN
INTRAMUSCULAR | Status: AC
  Filled 2017-02-03: qty 1

## 2017-02-03 NOTE — ED Triage Notes (Signed)
Pt brought over from [facility] with reports of allergic reaction after taking a dose of etodolac around 1030 for a headache. Pt states is very itchy and feels like her tongue is swelling. Skin is very red and face is flushed.  Pt taken straight back to room 19, edp at bedside.

## 2017-02-03 NOTE — ED Notes (Signed)
Gave pt water to drink, ok per Dr. Quentin Cornwall

## 2017-02-03 NOTE — ED Notes (Signed)
Pt states she feels like she can talk easier at this time. Face is less red after medication administration. Pt states she feels sleepy, side rails raised and lights dimmed for comfort. Pt sitting up on stretcher watching television. No distress noted at this time.

## 2017-02-03 NOTE — ED Notes (Signed)
E-signature pad not working in room. Signature page printed out and pt signed on paper.

## 2017-02-03 NOTE — ED Provider Notes (Signed)
[facility] Emergency Department Provider Note    None    (approximate)  I have reviewed the triage vital signs and the nursing notes.   HISTORY  Chief Complaint Allergic Reaction    HPI Lori Le is a 51 y.o. female presents from home with chief complaint of allergic reaction.  Her symptoms include diffuse urticaria feeling that her tongue is swelling and tightness in her throat associated with nausea.  This occurred shortly after taking a etodolac medication around 1030 this morning for a headache that she was having.  Denies any previous allergic reactions to this medication.  No recent fevers.  She does not take lisinopril.  Denies any chest pain or shortness of breath at this time.  She did take Benadryl prior to arrival.  Past Medical History:  Diagnosis Date  . Allergic rhinitis due to allergen   . Allergy   . Anemia   . Arthritis   . Cancer ([place])    skin ca  . Chronic idiopathic urticaria   . Complication of anesthesia    nausea, slow to wake up  . Depression   . GERD (gastroesophageal reflux disease)   . Gout   . Gout   . Hashimoto's thyroiditis   . Heart murmur   . History of eating disorder   . Hives   . Hyperlipidemia   . Hypertension   . Hypothyroidism   . Lower extremity edema   . Melanoma in situ ([place])    left shoulder  . Migraine   . Mitral valve disorder   . Motion sickness    all moving vehicles  . PCOS (polycystic ovarian syndrome)   . PONV (postoperative nausea and vomiting)   . Thyroid disease   . Ulcer   . Vertigo    last episode over 1 yr ago   Family History  Problem Relation Age of Onset  . Arthritis Mother   . Hyperlipidemia Mother   . Hypertension Mother   . Diabetes Mother   . Arthritis Father   . Hyperlipidemia Father   . Hypertension Father   . Mental illness Father   . Diabetes Father   . Arthritis Maternal Grandmother   . Cancer Maternal Grandmother        breast cancer  .  Hyperlipidemia Maternal Grandmother   . Hypertension Maternal Grandmother   . Breast cancer Maternal Grandmother   . Arthritis Maternal Grandfather   . Hyperlipidemia Maternal Grandfather   . Hypertension Maternal Grandfather   . Heart disease Maternal Grandfather        heart attack  . Breast cancer Paternal Grandmother    Past Surgical History:  Procedure Laterality Date  . BREAST BIOPSY Right    neg- bx/clip  . CHOLECYSTECTOMY  2003  . FOOT SURGERY  1994  . GASTRIC BYPASS  2011  . JOINT REPLACEMENT Left 09/27/2014   HIP  . JOINT REPLACEMENT Left 12/01/2014   KNEE  . KNEE ARTHROSCOPY Right   . LAPAROSCOPIC GASTRIC BANDING  2010  . ROTATOR CUFF REPAIR Bilateral    right shoulder 07-26-2016   Patient Active Problem List   Diagnosis Date Noted  . Pharyngitis due to Streptococcus species 12/18/2016  . Rotator cuff syndrome 04/24/2015  . Arthritis of knee, degenerative 04/24/2015  . Degenerative arthritis of hip 04/24/2015  . B12 deficiency 01/06/2015  . Primary osteoarthritis of knee 12/01/2014  . Anemia 11/22/2014  . Primary localized osteoarthritis of left hip 09/27/2014  . BMI 34.0-34.9,adult  03/20/2014  . Gout 03/20/2014  . Excessive and frequent menstruation 08/10/2013  . Edema 12/30/2012  . Essential hypertension, benign 08/02/2012  . Melanoma in situ ([place]) 08/02/2012  . Depression 08/02/2012  . Environmental allergies 08/02/2012  . Hypercholesterolemia 08/02/2012  . Migraines 08/02/2012  . Hypothyroidism 08/02/2012  . PCOS (polycystic ovarian syndrome) 08/02/2012  . GERD (gastroesophageal reflux disease) 08/02/2012  . H/O gastric bypass 08/02/2012      Prior to Admission medications   Medication Sig Start Date End Date Taking? Authorizing Provider  acetaminophen (TYLENOL) 650 MG CR tablet Take 1,300 mg by mouth 2 (two) times daily.    [provider]  cefdinir (OMNICEF) 300 MG capsule Take 1 capsule (300 mg total) by mouth 2 (two) times daily.  12/18/16   Coral Spikes, DO  DULoxetine (CYMBALTA) 60 MG capsule Take 1 capsule (60 mg total) by mouth daily. 12/24/16   Arfeen, Arlyce Harman, MD  EPINEPHrine 0.3 mg/0.3 mL IJ SOAJ injection Inject 0.3 mLs (0.3 mg total) once for 1 dose into the muscle. 02/03/17 02/03/17  Merlyn Lot, MD  furosemide (LASIX) 40 MG tablet TAKE 1 TABLET (40 MG TOTAL) BY MOUTH DAILY. 10/25/16   Einar Pheasant, MD  levothyroxine (SYNTHROID, LEVOTHROID) 137 MCG tablet TAKE ONE TABLET BY MOUTH ONCE DAILY BEFORE BREAKFAST Patient taking differently: Take 137 mcg by mouth daily before breakfast. DAILY BEFORE BREAKFAST 09/11/15   Einar Pheasant, MD  pantoprazole (PROTONIX) 40 MG tablet Take 1 tablet (40 mg total) by mouth daily. 12/12/16   Einar Pheasant, MD  potassium chloride (KLOR-CON 10) 10 MEQ tablet TAKE ONE TABLET TWICE FOR THREE DAYS AND THEN DAILY. *MAINT REFILLS EXCEEDED* 11/13/16   Einar Pheasant, MD  predniSONE (DELTASONE) 10 MG tablet Take 3 pills once daily by mouth for 3 days, then 2 pills once daily for 3 days, then 1 pill once daily for 3 days and then stop 12/04/16   Tower, Wynelle Fanny, MD  predniSONE (DELTASONE) 10 MG tablet Take 1 tablet (10 mg total) daily by mouth. Day 1-2: Take 50 mg  ( 5 pills) Day 3-4 : Take 40 mg (4pills) Day 5-6: Take 30 mg (3 pills) Day 7-8:  Take 20 mg (2 pills) Day 9:  Take 10mg  (1 pill) 02/03/17   Merlyn Lot, MD  triamterene-hydrochlorothiazide (MAXZIDE-25) 37.5-25 MG tablet Take 1 tablet by mouth daily. 11/13/16   Einar Pheasant, MD    Allergies Influenza vaccines; Lactose; Lactose intolerance (gi); Bacitracin-neomycin-polymyxin; Neomycin-bacitracin zn-polymyx; and Neosporin + pain relief max st [neomy-bacit-polymyx-pramoxine]    Social History Social History   Tobacco Use  . Smoking status: Never Smoker  . Smokeless tobacco: Never Used  Substance Use Topics  . Alcohol use: Yes    Alcohol/week: 0.0 - 0.6 oz    Comment: occ.  . Drug use: No    Review of  Systems Patient denies headaches, rhinorrhea, blurry vision, numbness, shortness of breath, chest pain, edema, cough, abdominal pain, nausea, vomiting, diarrhea, dysuria, fevers, rashes or hallucinations unless otherwise stated above in HPI. ____________________________________________   PHYSICAL EXAM:  VITAL SIGNS: Vitals:   02/03/17 1330 02/03/17 1400  BP: (!) 132/56 125/63  Pulse: 87 80  Resp: 18 (!) 22  Temp:    SpO2: 95% 96%    Constitutional: Alert and oriented. Flushed and red appearing but in no acute distress. Eyes: Conjunctivae are normal.  Head: Atraumatic. Nose: No congestion/rhinnorhea. Mouth/Throat: Mucous membranes are moist. Mild edema to tongue, no uvular edema  Neck: No stridor. Painless ROM.  Cardiovascular: Normal rate, regular rhythm. Grossly normal heart sounds.  Good peripheral circulation. Respiratory: Normal respiratory effort.  No retractions. Lungs CTAB. Gastrointestinal: Soft and nontender. No distention. No abdominal bruits. No CVA tenderness. Genitourinary:  Musculoskeletal: No lower extremity tenderness nor edema.  No joint effusions. Neurologic:  Normal speech and language. No gross focal neurologic deficits are appreciated. No facial droop Skin:  Skin is warm, dry and intact. Diffuse redness and urticaria  Psychiatric: Mood and affect are normal. Speech and behavior are normal.  ____________________________________________   LABS (all labs ordered are listed, but only abnormal results are displayed)  No results found for this or any previous visit (from the past 24 hour(s)). ____________________________________________ ____________________________________________  [id number]   ____________________________________________   PROCEDURES  Procedure(s) performed:  Procedures    Critical Care performed: no ____________________________________________   INITIAL IMPRESSION / ASSESSMENT AND PLAN / ED COURSE  Pertinent labs & imaging  results that were available during my care of the patient were reviewed by me and considered in my medical decision making (see chart for details).  DDX: anaphylaxis, anaphylactoid, urticaria, drug reaction,   Lori Le is a 51 y.o. who presents to the ED with diffuse urticaria and tongue swelling that occurred after taking etodolac pill this morning as described above.  Patient currently protecting her airway.  Based on the rapidity of onset will treat for anaphylaxis with EpiPen, Benadryl, Pepcid and Solu-Medrol.  We will continue to observe.   ----------------------------------------- 11:17 AM on 02/03/2017 -----------------------------------------   OBSERVATION CARE: This patient is being placed under observation care for the following reasons: Allergic reaction/anaphylaxis requiring treatment and serial exams to determine response to treatment      Clinical Course as of Feb 03 1410  Mon Feb 03, 2017  1249 Patient reassessed with significant improvement in symptoms.  No edema.  Tongue swelling has improved.  Urticaria has resolved.  [PR]  [id number]  ----------------------------------------- 2:08 PM on @EDTODAY @ -----------------------------------------   END OF OBSERVATION STATUS: After an appropriate period of observation, this patient is being discharged due to the following reason(s): Patient reassessed with significant improvement in symptoms.  No edema or tongue swelling.  No respiratory distress.  Patient will be discharged with steroid taper, EpiPen and follow-up with PCP.   [PR]    Clinical Course User Index [PR] Merlyn Lot, MD     ____________________________________________   FINAL CLINICAL IMPRESSION(S) / ED DIAGNOSES  Final diagnoses:  Allergic reaction, initial encounter      NEW MEDICATIONS STARTED DURING THIS VISIT:  This SmartLink is deprecated. Use AVSMEDLIST instead to display the medication list for a patient.   Note:  This document  was prepared using Dragon voice recognition software and may include unintentional dictation errors.    Merlyn Lot, MD 02/03/17 [phone]

## 2017-02-04 ENCOUNTER — Other Ambulatory Visit: Payer: Self-pay | Admitting: Internal Medicine

## 2017-02-04 NOTE — Telephone Encounter (Signed)
Last script 11/13/16#30 2 rf Last bmp 12/26/16 O/v 12/12/16 F/u 03/21/17

## 2017-02-04 NOTE — Telephone Encounter (Signed)
Please confirm exactly how she is taking her potassium and correct rx.  Ok to refill x 3.

## 2017-02-05 ENCOUNTER — Other Ambulatory Visit: Payer: Self-pay | Admitting: Urgent Care

## 2017-02-05 ENCOUNTER — Other Ambulatory Visit: Payer: Self-pay | Admitting: Hematology and Oncology

## 2017-02-05 ENCOUNTER — Inpatient Hospital Stay: Payer: 59 | Attending: Hematology and Oncology

## 2017-02-05 VITALS — BP 137/81 | HR 94 | Temp 97.1°F | Resp 18

## 2017-02-05 DIAGNOSIS — Z79899 Other long term (current) drug therapy: Secondary | ICD-10-CM | POA: Insufficient documentation

## 2017-02-05 DIAGNOSIS — D500 Iron deficiency anemia secondary to blood loss (chronic): Secondary | ICD-10-CM | POA: Diagnosis present

## 2017-02-05 MED ORDER — IRON SUCROSE 20 MG/ML IV SOLN
200.0000 mg | Freq: Once | INTRAVENOUS | Status: AC
  Administered 2017-02-05: 200 mg via INTRAVENOUS
  Filled 2017-02-05: qty 10

## 2017-02-12 ENCOUNTER — Inpatient Hospital Stay: Payer: 59

## 2017-02-12 VITALS — BP 129/81 | HR 81 | Temp 97.6°F | Resp 18

## 2017-02-12 DIAGNOSIS — D500 Iron deficiency anemia secondary to blood loss (chronic): Secondary | ICD-10-CM

## 2017-02-12 MED ORDER — IRON SUCROSE 20 MG/ML IV SOLN
200.0000 mg | Freq: Once | INTRAVENOUS | Status: AC
  Administered 2017-02-12: 200 mg via INTRAVENOUS
  Filled 2017-02-12: qty 10

## 2017-02-12 MED ORDER — SODIUM CHLORIDE 0.9 % IV SOLN
INTRAVENOUS | Status: DC
  Administered 2017-02-12: 14:00:00 via INTRAVENOUS
  Filled 2017-02-12: qty 1000

## 2017-02-24 ENCOUNTER — Emergency Department
Admission: EM | Admit: 2017-02-24 | Discharge: 2017-02-24 | Disposition: A | Discharge status: Home / Self Care | Payer: 59 | Attending: Emergency Medicine | Admitting: Emergency Medicine

## 2017-02-24 ENCOUNTER — Encounter: Payer: Self-pay | Admitting: Intensive Care

## 2017-02-24 ENCOUNTER — Ambulatory Visit: Payer: Self-pay | Admitting: *Deleted

## 2017-02-24 ENCOUNTER — Emergency Department: Payer: 59

## 2017-02-24 DIAGNOSIS — I10 Essential (primary) hypertension: Secondary | ICD-10-CM | POA: Diagnosis not present

## 2017-02-24 DIAGNOSIS — Y9301 Activity, walking, marching and hiking: Secondary | ICD-10-CM | POA: Diagnosis not present

## 2017-02-24 DIAGNOSIS — W25XXXA Contact with sharp glass, initial encounter: Secondary | ICD-10-CM | POA: Insufficient documentation

## 2017-02-24 DIAGNOSIS — Y929 Unspecified place or not applicable: Secondary | ICD-10-CM | POA: Insufficient documentation

## 2017-02-24 DIAGNOSIS — S90851A Superficial foreign body, right foot, initial encounter: Secondary | ICD-10-CM

## 2017-02-24 DIAGNOSIS — Z79899 Other long term (current) drug therapy: Secondary | ICD-10-CM | POA: Diagnosis not present

## 2017-02-24 DIAGNOSIS — S99822A Other specified injuries of left foot, initial encounter: Secondary | ICD-10-CM | POA: Diagnosis not present

## 2017-02-24 DIAGNOSIS — E039 Hypothyroidism, unspecified: Secondary | ICD-10-CM | POA: Diagnosis not present

## 2017-02-24 DIAGNOSIS — S99922A Unspecified injury of left foot, initial encounter: Secondary | ICD-10-CM | POA: Diagnosis present

## 2017-02-24 DIAGNOSIS — Y999 Unspecified external cause status: Secondary | ICD-10-CM | POA: Diagnosis not present

## 2017-02-24 MED ORDER — HYDROCODONE-ACETAMINOPHEN 5-325 MG PO TABS: 1.0000 | ORAL_TABLET | ORAL | 0 refills | Status: DC | PRN

## 2017-02-24 MED ORDER — LIDOCAINE HCL (PF) 1 % IJ SOLN
10.0000 mL | Freq: Once | INTRAMUSCULAR | Status: AC
  Administered 2017-02-24: 10 mL

## 2017-02-24 MED ORDER — SULFAMETHOXAZOLE-TRIMETHOPRIM 800-160 MG PO TABS: 1.0000 | ORAL_TABLET | Freq: Two times a day (BID) | ORAL | 0 refills | Status: AC

## 2017-02-24 MED ORDER — SULFAMETHOXAZOLE-TRIMETHOPRIM 800-160 MG PO TABS
1.0000 | ORAL_TABLET | Freq: Once | ORAL | Status: AC
Start: 2017-02-24 — End: 2017-02-24
  Administered 2017-02-24: 1 via ORAL
  Filled 2017-02-24: qty 1

## 2017-02-24 MED ORDER — LIDOCAINE HCL (PF) 1 % IJ SOLN
INTRAMUSCULAR | Status: AC
  Administered 2017-02-24: 10 mL
  Filled 2017-02-24: qty 10

## 2017-02-24 MED ORDER — CEPHALEXIN 750 MG PO CAPS: 750.0000 mg | ORAL_CAPSULE | Freq: Two times a day (BID) | ORAL | 0 refills | Status: AC

## 2017-02-24 NOTE — Telephone Encounter (Signed)
FYI - patient is going to urgent care. 

## 2017-02-24 NOTE — ED Triage Notes (Signed)
Patient reports "I broke a salt shaker on saturday and thought I cleaned all the glass up but yesterday stepped on some glass" Small cut noted to bottom of foot and swelling on top of right foot

## 2017-02-24 NOTE — Telephone Encounter (Signed)
   Reason for Disposition . [1] Caller reluctant to take out FB AND [2] it's causing pain  Answer Assessment - Initial Assessment Questions 1. MECHANISM: "How did it happen?"      Patient broke a salt shaker- saturday 2. LOCATION: "Where is the FB (e.g., splinter) located?"      R foot- ball of foot 3. OBJECT: "What type of FB was it?"      glass 4. DEPTH: "How deep do you think the FB goes?"      unsure 5. ONSET: "When did the injury occur?" (Minutes or hours)      saturday 6. PAIN: "Is it painful?" If so, ask: "How bad is the pain?"  (Scale 1-10; or mild, moderate, severe)     Yes 3-4 sitting 6-7 walking 7. TETANUS: "When was the last tetanus booster?"     unknown 8. PREGNANCY: "Is there any chance you are pregnant?" "When was your last menstrual period?"     Ablation  Protocols used: SKIN FOREIGN BODY-A-AH

## 2017-02-24 NOTE — ED Notes (Signed)
Right bottom of foot with small puncture. Top of right foot red, swollen and warm to touch

## 2017-02-24 NOTE — ED Provider Notes (Signed)
[facility] Emergency Department Provider Note  ____________________________________________  Time seen: Approximately 4:42 PM  I have reviewed the triage vital signs and the nursing notes.   HISTORY  Chief Complaint Foreign Body in Skin (right foot)    HPI Lori Le is a 51 y.o. female who presents emergency department complaining of laceration and foreign body to the right sole of the foot.  Patient reports that she broke a saltshaker 2 days ago.  She thought she cleaned up all the glass, walk to her kitchen barefoot and accidentally stepped on her remaining glass shard.  Patient reports that she has a foreign body sensation to this along.  Small laceration to the sole of her foot.  No other injury or complaint.  No medications prior to arrival.  Her tetanus shot is up-to-date.  Past Medical History:  Diagnosis Date  . Allergic rhinitis due to allergen   . Allergy   . Anemia   . Arthritis   . Cancer ([place])    skin ca  . Chronic idiopathic urticaria   . Complication of anesthesia    nausea, slow to wake up  . Depression   . GERD (gastroesophageal reflux disease)   . Gout   . Gout   . Hashimoto's thyroiditis   . Heart murmur   . History of eating disorder   . Hives   . Hyperlipidemia   . Hypertension   . Hypothyroidism   . Lower extremity edema   . Melanoma in situ ([place])    left shoulder  . Migraine   . Mitral valve disorder   . Motion sickness    all moving vehicles  . PCOS (polycystic ovarian syndrome)   . PONV (postoperative nausea and vomiting)   . Thyroid disease   . Ulcer   . Vertigo    last episode over 1 yr ago    Patient Active Problem List   Diagnosis Date Noted  . Pharyngitis due to Streptococcus species 12/18/2016  . Rotator cuff syndrome 04/24/2015  . Arthritis of knee, degenerative 04/24/2015  . Degenerative arthritis of hip 04/24/2015  . B12 deficiency 01/06/2015  . Primary osteoarthritis of knee 12/01/2014  .  Anemia 11/22/2014  . Primary localized osteoarthritis of left hip 09/27/2014  . BMI 34.0-34.9,adult 03/20/2014  . Gout 03/20/2014  . Excessive and frequent menstruation 08/10/2013  . Edema 12/30/2012  . Essential hypertension, benign 08/02/2012  . Melanoma in situ ([place]) 08/02/2012  . Depression 08/02/2012  . Environmental allergies 08/02/2012  . Hypercholesterolemia 08/02/2012  . Migraines 08/02/2012  . Hypothyroidism 08/02/2012  . PCOS (polycystic ovarian syndrome) 08/02/2012  . GERD (gastroesophageal reflux disease) 08/02/2012  . H/O gastric bypass 08/02/2012    Past Surgical History:  Procedure Laterality Date  . BREAST BIOPSY Right    neg- bx/clip  . CHOLECYSTECTOMY  2003  . DILITATION & CURRETTAGE/HYSTROSCOPY WITH NOVASURE ABLATION N/A 08/19/2016   Procedure: DILATATION & CURETTAGE/HYSTEROSCOPY WITH NOVASURE ABLATION;  Surgeon: [name], MD;  Location: [facility] ORS;  Service: Gynecology;  Laterality: N/A;  . ESOPHAGOGASTRODUODENOSCOPY (EGD) WITH PROPOFOL N/A 12/05/2015   Procedure: ESOPHAGOGASTRODUODENOSCOPY (EGD) WITH PROPOFOL;  Surgeon: Lollie Sails, MD;  Location: [facility] ENDOSCOPY;  Service: Endoscopy;  Laterality: N/A;  . ESOPHAGOGASTRODUODENOSCOPY (EGD) WITH PROPOFOL N/A 02/06/2016   Procedure: ESOPHAGOGASTRODUODENOSCOPY (EGD) WITH PROPOFOL;  Surgeon: Lollie Sails, MD;  Location: [facility] ENDOSCOPY;  Service: Endoscopy;  Laterality: N/A;  . FOOT SURGERY  1994  . GASTRIC BYPASS  2011  . JOINT REPLACEMENT Left  09/27/2014   HIP  . JOINT REPLACEMENT Left 12/01/2014   KNEE  . KNEE ARTHROSCOPY Right   . KNEE ARTHROSCOPY Left 03/23/2015   Procedure: ARTHROSCOPY KNEE, partial synovectomy;  Surgeon: Hessie Knows, MD;  Location: [facility] ORS;  Service: Orthopedics;  Laterality: Left;  . LAPAROSCOPIC GASTRIC BANDING  2010  . ROTATOR CUFF REPAIR Bilateral    right shoulder 07-26-2016  . SHOULDER ARTHROSCOPY Left 06/20/2015   Procedure: ARTHROSCOPY SHOULDER, REPAIR OF MASSIVE  ROTATOR CUFF TEAR, TENODESIS, DECOMPRESSION DEBRIDEMENT;  Surgeon: Corky Mull, MD;  Location: [facility] ORS;  Service: Orthopedics;  Laterality: Left;  . TOTAL HIP ARTHROPLASTY Left 09/27/2014   Procedure: TOTAL HIP ARTHROPLASTY ANTERIOR APPROACH;  Surgeon: Hessie Knows, MD;  Location: [facility] ORS;  Service: Orthopedics;  Laterality: Left;  . TOTAL KNEE ARTHROPLASTY Left 12/01/2014   Procedure: TOTAL KNEE ARTHROPLASTY;  Surgeon: Hessie Knows, MD;  Location: [facility] ORS;  Service: Orthopedics;  Laterality: Left;    Prior to Admission medications   Medication Sig Start Date End Date Taking? Authorizing Provider  acetaminophen (TYLENOL) 650 MG CR tablet Take 1,300 mg by mouth 2 (two) times daily.    [provider]  cefdinir (OMNICEF) 300 MG capsule Take 1 capsule (300 mg total) by mouth 2 (two) times daily. 12/18/16   Coral Spikes, DO  cephALEXin (KEFLEX) 750 MG capsule Take 1 capsule (750 mg total) by mouth 2 (two) times daily for 10 days. 02/24/17 03/06/17  Cuthriell, Charline Bills, PA-C  DULoxetine (CYMBALTA) 60 MG capsule Take 1 capsule (60 mg total) by mouth daily. 12/24/16   Arfeen, Arlyce Harman, MD  furosemide (LASIX) 40 MG tablet TAKE 1 TABLET (40 MG TOTAL) BY MOUTH DAILY. 10/25/16   Einar Pheasant, MD  HYDROcodone-acetaminophen (NORCO/VICODIN) 5-325 MG tablet Take 1 tablet by mouth every 4 (four) hours as needed for moderate pain. 02/24/17   Cuthriell, Charline Bills, PA-C  levothyroxine (SYNTHROID, LEVOTHROID) 137 MCG tablet TAKE ONE TABLET BY MOUTH ONCE DAILY BEFORE BREAKFAST Patient taking differently: Take 137 mcg by mouth daily before breakfast. DAILY BEFORE BREAKFAST 09/11/15   Einar Pheasant, MD  pantoprazole (PROTONIX) 40 MG tablet Take 1 tablet (40 mg total) by mouth daily. 12/12/16   Einar Pheasant, MD  potassium chloride (KLOR-CON 10) 10 MEQ tablet TAKE ONE TABLET TWICE FOR THREE DAYS AND THEN DAILY. *MAINT REFILLS EXCEEDED* 02/04/17   Einar Pheasant, MD  predniSONE (DELTASONE) 10 MG tablet Take 3  pills once daily by mouth for 3 days, then 2 pills once daily for 3 days, then 1 pill once daily for 3 days and then stop 12/04/16   Tower, Wynelle Fanny, MD  predniSONE (DELTASONE) 10 MG tablet Take 1 tablet (10 mg total) daily by mouth. Day 1-2: Take 50 mg  ( 5 pills) Day 3-4 : Take 40 mg (4pills) Day 5-6: Take 30 mg (3 pills) Day 7-8:  Take 20 mg (2 pills) Day 9:  Take 10mg  (1 pill) 02/03/17   Merlyn Lot, MD  sulfamethoxazole-trimethoprim (BACTRIM DS,SEPTRA DS) 800-160 MG tablet Take 1 tablet by mouth 2 (two) times daily for 10 days. 02/24/17 03/06/17  Cuthriell, Charline Bills, PA-C  triamterene-hydrochlorothiazide (MAXZIDE-25) 37.5-25 MG tablet Take 1 tablet by mouth daily. 11/13/16   Einar Pheasant, MD    Allergies Etodolac; Influenza vaccines; Lactose; Lactose intolerance (gi); Bacitracin-neomycin-polymyxin; Neomycin-bacitracin zn-polymyx; and Neosporin + pain relief max st [neomy-bacit-polymyx-pramoxine]  Family History  Problem Relation Age of Onset  . Arthritis Mother   . Hyperlipidemia Mother   . Hypertension Mother   .  Diabetes Mother   . Arthritis Father   . Hyperlipidemia Father   . Hypertension Father   . Mental illness Father   . Diabetes Father   . Arthritis Maternal Grandmother   . Cancer Maternal Grandmother        breast cancer  . Hyperlipidemia Maternal Grandmother   . Hypertension Maternal Grandmother   . Breast cancer Maternal Grandmother   . Arthritis Maternal Grandfather   . Hyperlipidemia Maternal Grandfather   . Hypertension Maternal Grandfather   . Heart disease Maternal Grandfather        heart attack  . Breast cancer Paternal Grandmother     Social History Social History   Tobacco Use  . Smoking status: Never Smoker  . Smokeless tobacco: Never Used  Substance Use Topics  . Alcohol use: Yes    Alcohol/week: 0.0 - 0.6 oz    Comment: occ.  . Drug use: No     Review of Systems  Constitutional: No fever/chills Eyes: No visual changes.   Cardiovascular: no chest pain. Respiratory: no cough. No SOB. Gastrointestinal: No abdominal pain.  No nausea, no vomiting.   Musculoskeletal: Negative for musculoskeletal pain. Skin: Positive for laceration and foreign body to the sole of the right foot. Neurological: Negative for headaches, focal weakness or numbness. 10-point ROS otherwise negative.  ____________________________________________   PHYSICAL EXAM:  VITAL SIGNS: ED Triage Vitals  Enc Vitals Group     BP 02/24/17 1555 131/72     Pulse Rate 02/24/17 1555 81     Resp 02/24/17 1555 16     Temp 02/24/17 1555 98.1 F (36.7 C)     Temp Source 02/24/17 1555 Oral     SpO2 02/24/17 1555 99 %     Weight 02/24/17 1556 225 lb (102.1 kg)     Height 02/24/17 1556 5\' 7"  (1.702 m)     Head Circumference --      Peak Flow --      Pain Score 02/24/17 1554 6     Pain Loc --      Pain Edu? --      Excl. in [place]? --      Constitutional: Alert and oriented. Well appearing and in no acute distress. Eyes: Conjunctivae are normal. PERRL. EOMI. Head: Atraumatic. Neck: No stridor.    Cardiovascular: Normal rate, regular rhythm. Normal S1 and S2.  Good peripheral circulation. Respiratory: Normal respiratory effort without tachypnea or retractions. Lungs CTAB. Good air entry to the bases with no decreased or absent breath sounds. Musculoskeletal: Full range of motion to all extremities. No gross deformities appreciated. Neurologic:  Normal speech and language. No gross focal neurologic deficits are appreciated.  Skin:  Skin is warm, dry and intact. No rash noted.  Small puncture wound noted to the ball of the right foot.  No bleeding.  No visible foreign body is tender to palpation.  No palpable abnormality.  Full range of motion all 5 digits.  Sensation and cap refill intact all 5 digits. Psychiatric: Mood and affect are normal. Speech and behavior are normal. Patient exhibits appropriate insight and  judgement.   ____________________________________________   LABS (all labs ordered are listed, but only abnormal results are displayed)  Labs Reviewed - No data to display ____________________________________________  EKG   ____________________________________________  RADIOLOGY Diamantina Providence Cuthriell, personally viewed and evaluated these images (plain radiographs) as part of my medical decision making, as well as reviewing the written report by the radiologist.  Dg Foot 2 Views  Right  Result Date: 02/24/2017 CLINICAL DATA:  Initial evaluation status post attempted removal of foreign body. EXAM: RIGHT FOOT - 2 VIEW COMPARISON:  Prior radiograph from earlier same day. FINDINGS: Previously identified radiopaque foreign body remains within the soft tissues of the foot. Additional ovoid density suspected to reflect post treatment changes. Mild diffuse soft tissue swelling about the foot is similar. No new osseous abnormality. IMPRESSION: Unchanged position and appearance of retained foreign body at the plantar aspect of the right foot. Electronically Signed   By: Jeannine Boga M.D.   On: 02/24/2017 18:15   Dg Foot Complete Right  Result Date: 02/24/2017 CLINICAL DATA:  Stepped on glass. EXAM: RIGHT FOOT COMPLETE - 3+ VIEW COMPARISON:  07/21/2014 FINDINGS: Metal wire in the proximal phalanx of the great toe is stable. No acute fracture. No dislocation. There is ankylosis of the PIP joints of the second, third, and fourth digits. There is a radiopaque foreign body within the soft tissues inferior to the metatarsophalangeal junction. This is only noted on the lateral view. There is soft tissue swelling about the forefoot. IMPRESSION: There is a radiopaque foreign body in the soft tissues below the metatarsophalangeal junction. This may represent broken glass within the soft tissues. No acute fracture. Electronically Signed   By: Marybelle Killings M.D.   On: 02/24/2017 16:48     ____________________________________________    PROCEDURES  Procedure(s) performed:    .Foreign Body Removal Date/Time: 02/24/2017 7:58 PM Performed by: Darletta Moll, PA-C Authorized by: Darletta Moll, PA-C  Consent: Verbal consent obtained. Risks and benefits: risks, benefits and alternatives were discussed Consent given by: patient Patient understanding: patient states understanding of the procedure being performed Imaging studies: imaging studies available Patient identity confirmed: verbally with patient Body area: skin General location: lower extremity Location details: left foot Anesthesia: local infiltration  Anesthesia: Local Anesthetic: lidocaine 1% without epinephrine Anesthetic total: 6 mL  Sedation: Patient sedated: no  Patient restrained: no Patient cooperative: yes Localization method: serial x-rays, ultrasound and probed Removal mechanism: forceps, hemostat, irrigation and scalpel Dressing: dressing applied Tendon involvement: none Depth: deep Complexity: complex 2 objects recovered. Objects recovered: glass Post-procedure assessment: residual foreign bodies remain Patient tolerance: Patient tolerated the procedure well with no immediate complications Comments: Foreign body visualized on x-ray.  2 small glass particles are successfully removed.  Large remaining particle visualized on x-ray is unsuccessfully removed.  Repeat x-ray revealed no change in location after some 2 small glass particles were removed.  I attempted manual location with forceps.  This was deep to surface.  Occasionally, forceps would make contact with foreign body but cannot secure for removal.  After multiple attempts, removal was attempted with bedside ultrasound.  Ultrasound reveals foreign body, with placement of forceps, glass particle would move deeper into the tissue.  At this time, attempts at removal were stopped.  Patient will be referred to podiatry for  further attempts at removal of glass.  Area was dressed.  Postop shoe was applied to limit motion for the foot in that area.      Medications  lidocaine (PF) (XYLOCAINE) 1 % injection 10 mL (10 mLs Infiltration Given by Other 02/24/17 1924)  sulfamethoxazole-trimethoprim (BACTRIM DS,SEPTRA DS) 800-160 MG per tablet 1 tablet (1 tablet Oral Given 02/24/17 1944)     ____________________________________________   INITIAL IMPRESSION / ASSESSMENT AND PLAN / ED COURSE  Pertinent labs & imaging results that were available during my care of the patient were reviewed by me and considered  in my medical decision making (see chart for details).  Review of the [place] CSRS was performed in accordance of the [place] prior to dispensing any controlled drugs.     Patient's diagnosis is consistent with foreign body to the right foot.  X-ray revealed foreign body consistent with glass shard to the right foot.  Significant attempts were appreciated without successful removal of foreign body.  2 small, non-radiopaque foreign bodies were successfully removed.  Foreign body initially seen was unable to be extricated.  see above note for attempts at removal.   Patient will be placed on 2 antibiotics for coverage and referred to podiatry for further extraction..  She is also given a limited prescription of Norco.  Patient is given ED precautions to return to the ED for any worsening or new symptoms.     ____________________________________________  FINAL CLINICAL IMPRESSION(S) / ED DIAGNOSES  Final diagnoses:  Foreign body in right foot, initial encounter      NEW MEDICATIONS STARTED DURING THIS VISIT:  ED Discharge Orders        Ordered    sulfamethoxazole-trimethoprim (BACTRIM DS,SEPTRA DS) 800-160 MG tablet  2 times daily     02/24/17 1934    HYDROcodone-acetaminophen (NORCO/VICODIN) 5-325 MG tablet  Every 4 hours PRN     02/24/17 1934    cephALEXin (KEFLEX) 750 MG capsule  2 times daily      02/24/17 1934          This chart was dictated using voice recognition software/Dragon. Despite best efforts to proofread, errors can occur which can change the meaning. Any change was purely unintentional.    Darletta Moll, PA-C 02/24/17 2143    Carrie Mew, MD 02/25/17 2241

## 2017-03-18 ENCOUNTER — Ambulatory Visit (HOSPITAL_COMMUNITY): Payer: Self-pay | Admitting: Psychiatry

## 2017-03-21 ENCOUNTER — Ambulatory Visit (INDEPENDENT_AMBULATORY_CARE_PROVIDER_SITE_OTHER): Payer: 59 | Admitting: Internal Medicine

## 2017-03-21 ENCOUNTER — Encounter: Payer: Self-pay | Admitting: Internal Medicine

## 2017-03-21 VITALS — BP 122/80 | HR 93 | Temp 98.3°F | Ht 67.5 in | Wt 230.0 lb

## 2017-03-21 DIAGNOSIS — R609 Edema, unspecified: Secondary | ICD-10-CM | POA: Diagnosis not present

## 2017-03-21 DIAGNOSIS — Z9884 Bariatric surgery status: Secondary | ICD-10-CM | POA: Diagnosis not present

## 2017-03-21 DIAGNOSIS — I10 Essential (primary) hypertension: Secondary | ICD-10-CM | POA: Diagnosis not present

## 2017-03-21 DIAGNOSIS — E7800 Pure hypercholesterolemia, unspecified: Secondary | ICD-10-CM

## 2017-03-21 DIAGNOSIS — Z6835 Body mass index (BMI) 35.0-35.9, adult: Secondary | ICD-10-CM | POA: Diagnosis not present

## 2017-03-21 DIAGNOSIS — F329 Major depressive disorder, single episode, unspecified: Secondary | ICD-10-CM

## 2017-03-21 DIAGNOSIS — F32A Depression, unspecified: Secondary | ICD-10-CM

## 2017-03-21 DIAGNOSIS — E039 Hypothyroidism, unspecified: Secondary | ICD-10-CM | POA: Diagnosis not present

## 2017-03-21 DIAGNOSIS — R739 Hyperglycemia, unspecified: Secondary | ICD-10-CM | POA: Diagnosis not present

## 2017-03-21 DIAGNOSIS — M25512 Pain in left shoulder: Secondary | ICD-10-CM | POA: Diagnosis not present

## 2017-03-21 DIAGNOSIS — D039 Melanoma in situ, unspecified: Secondary | ICD-10-CM | POA: Diagnosis not present

## 2017-03-21 DIAGNOSIS — K219 Gastro-esophageal reflux disease without esophagitis: Secondary | ICD-10-CM | POA: Diagnosis not present

## 2017-03-21 MED ORDER — MUPIROCIN 2 % EX OINT: 1.0000 "application " | TOPICAL_OINTMENT | Freq: Two times a day (BID) | CUTANEOUS | 0 refills | Status: DC

## 2017-03-21 NOTE — Progress Notes (Signed)
Patient ID: Lori Le, female   DOB: [birth year], 51 y.o.   MRN: [id number]   Subjective:    Patient ID: Lori Le, female    DOB: 19-Jan-1966, 51 y.o.   MRN: [id number]  HPI  Patient here for her physical exam.  She reports she is doing relatively well.  Divorced now.  Stress is better.  Tries to say active.  No chest pain.  No sob.  No acid reflux.  No abdominal pain or cramping.  Bowels moving.  Having problems with left shoulder.  Some numbness.  Discussed further testing and evaluation.  She prefers referral back to Dr Roland Rack.     Past Medical History:  Diagnosis Date  . Allergic rhinitis due to allergen   . Allergy   . Anemia   . Arthritis   . Cancer ([place])    skin ca  . Chronic idiopathic urticaria   . Complication of anesthesia    nausea, slow to wake up  . Depression   . GERD (gastroesophageal reflux disease)   . Gout   . Gout   . Hashimoto's thyroiditis   . Heart murmur   . History of eating disorder   . Hives   . Hyperlipidemia   . Hypertension   . Hypothyroidism   . Lower extremity edema   . Melanoma in situ ([place])    left shoulder  . Migraine   . Mitral valve disorder   . Motion sickness    all moving vehicles  . PCOS (polycystic ovarian syndrome)   . PONV (postoperative nausea and vomiting)   . Thyroid disease   . Ulcer   . Vertigo    last episode over 1 yr ago   Past Surgical History:  Procedure Laterality Date  . BREAST BIOPSY Right    neg- bx/clip  . CHOLECYSTECTOMY  2003  . DILITATION & CURRETTAGE/HYSTROSCOPY WITH NOVASURE ABLATION N/A 08/19/2016   Procedure: DILATATION & CURETTAGE/HYSTEROSCOPY WITH NOVASURE ABLATION;  Surgeon: [name], MD;  Location: [facility] ORS;  Service: Gynecology;  Laterality: N/A;  . ESOPHAGOGASTRODUODENOSCOPY (EGD) WITH PROPOFOL N/A 12/05/2015   Procedure: ESOPHAGOGASTRODUODENOSCOPY (EGD) WITH PROPOFOL;  Surgeon: Lollie Sails, MD;  Location[facility] ENDOSCOPY;  Service: Endoscopy;  Laterality: N/A;  .  ESOPHAGOGASTRODUODENOSCOPY (EGD) WITH PROPOFOL N/A 02/06/2016   Procedure: ESOPHAGOGASTRODUODENOSCOPY (EGD) WITH PROPOFOL;  Surgeon: Lollie Sails, MD;  Location: [facility] ENDOSCOPY;  Service: Endoscopy;  Laterality: N/A;  . FOOT SURGERY  1994  . GASTRIC BYPASS  2011  . JOINT REPLACEMENT Left 09/27/2014   HIP  . JOINT REPLACEMENT Left 12/01/2014   KNEE  . KNEE ARTHROSCOPY Right   . KNEE ARTHROSCOPY Left 03/23/2015   Procedure: ARTHROSCOPY KNEE, partial synovectomy;  Surgeon: Hessie Knows, MD;  Location: [facility] ORS;  Service: Orthopedics;  Laterality: Left;  . LAPAROSCOPIC GASTRIC BANDING  2010  . ROTATOR CUFF REPAIR Bilateral    right shoulder 07-26-2016  . SHOULDER ARTHROSCOPY Left 06/20/2015   Procedure: ARTHROSCOPY SHOULDER, REPAIR OF MASSIVE ROTATOR CUFF TEAR, TENODESIS, DECOMPRESSION DEBRIDEMENT;  Surgeon: Corky Mull, MD;  Location: [facility] ORS;  Service: Orthopedics;  Laterality: Left;  . TOTAL HIP ARTHROPLASTY Left 09/27/2014   Procedure: TOTAL HIP ARTHROPLASTY ANTERIOR APPROACH;  Surgeon: Hessie Knows, MD;  Location: [facility] ORS;  Service: Orthopedics;  Laterality: Left;  . TOTAL KNEE ARTHROPLASTY Left 12/01/2014   Procedure: TOTAL KNEE ARTHROPLASTY;  Surgeon: Hessie Knows, MD;  Location: [facility] ORS;  Service: Orthopedics;  Laterality: Left;   Family History  Problem Relation Age  of Onset  . Arthritis Mother   . Hyperlipidemia Mother   . Hypertension Mother   . Diabetes Mother   . Arthritis Father   . Hyperlipidemia Father   . Hypertension Father   . Mental illness Father   . Diabetes Father   . Arthritis Maternal Grandmother   . Cancer Maternal Grandmother        breast cancer  . Hyperlipidemia Maternal Grandmother   . Hypertension Maternal Grandmother   . Breast cancer Maternal Grandmother   . Arthritis Maternal Grandfather   . Hyperlipidemia Maternal Grandfather   . Hypertension Maternal Grandfather   . Heart disease Maternal Grandfather        heart attack  . Breast cancer  Paternal Grandmother    Social History   Socioeconomic History  . Marital status: Legally Separated    Spouse name: None  . Number of children: None  . Years of education: None  . Highest education level: None  Social Needs  . Financial resource strain: None  . Food insecurity - worry: None  . Food insecurity - inability: None  . Transportation needs - medical: None  . Transportation needs - non-medical: None  Occupational History  . None  Tobacco Use  . Smoking status: Never Smoker  . Smokeless tobacco: Never Used  Substance and Sexual Activity  . Alcohol use: Yes    Alcohol/week: 0.0 - 0.6 oz    Comment: occ.  . Drug use: No  . Sexual activity: Yes    Birth control/protection: Surgical    Comment: Ablation   Other Topics Concern  . None  Social History Narrative  . None    Outpatient Encounter Medications as of 03/21/2017  Medication Sig  . acetaminophen (TYLENOL) 650 MG CR tablet Take 1,300 mg by mouth 2 (two) times daily.  . DULoxetine (CYMBALTA) 60 MG capsule Take 1 capsule (60 mg total) by mouth daily.  . furosemide (LASIX) 40 MG tablet TAKE 1 TABLET (40 MG TOTAL) BY MOUTH DAILY.  [name] levothyroxine (SYNTHROID, LEVOTHROID) 137 MCG tablet TAKE ONE TABLET BY MOUTH ONCE DAILY BEFORE BREAKFAST (Patient taking differently: Take 137 mcg by mouth daily before breakfast. DAILY BEFORE BREAKFAST)  . pantoprazole (PROTONIX) 40 MG tablet Take 1 tablet (40 mg total) by mouth daily.  . potassium chloride (KLOR-CON 10) 10 MEQ tablet TAKE ONE TABLET TWICE FOR THREE DAYS AND THEN DAILY. *MAINT REFILLS EXCEEDED*  . triamterene-hydrochlorothiazide (MAXZIDE-25) 37.5-25 MG tablet Take 1 tablet by mouth daily.  . mupirocin ointment (BACTROBAN) 2 % Place 1 application into the nose 2 (two) times daily.  . [DISCONTINUED] cefdinir (OMNICEF) 300 MG capsule Take 1 capsule (300 mg total) by mouth 2 (two) times daily. (Patient not taking: Reported on 03/21/2017)  . [DISCONTINUED]  HYDROcodone-acetaminophen (NORCO/VICODIN) 5-325 MG tablet Take 1 tablet by mouth every 4 (four) hours as needed for moderate pain. (Patient not taking: Reported on 03/21/2017)  . [DISCONTINUED] predniSONE (DELTASONE) 10 MG tablet Take 3 pills once daily by mouth for 3 days, then 2 pills once daily for 3 days, then 1 pill once daily for 3 days and then stop (Patient not taking: Reported on 03/21/2017)  . [DISCONTINUED] predniSONE (DELTASONE) 10 MG tablet Take 1 tablet (10 mg total) daily by mouth. Day 1-2: Take 50 mg  ( 5 pills) Day 3-4 : Take 40 mg (4pills) Day 5-6: Take 30 mg (3 pills) Day 7-8:  Take 20 mg (2 pills) Day 9:  Take 24m (1 pill) (Patient not taking: Reported  on 03/21/2017)   No facility-administered encounter medications on file as of 03/21/2017.     Review of Systems  Constitutional: Negative for appetite change and unexpected weight change.  HENT: Negative for congestion and sinus pressure.   Eyes: Negative for pain and visual disturbance.  Respiratory: Negative for cough, chest tightness and shortness of breath.   Cardiovascular: Negative for chest pain, palpitations and leg swelling.  Gastrointestinal: Negative for abdominal pain, diarrhea, nausea and vomiting.  Genitourinary: Negative for difficulty urinating and dysuria.  Musculoskeletal: Negative for joint swelling and myalgias.       Left shoulder discomfort as outlined.    Skin: Negative for color change and rash.  Neurological: Negative for dizziness, light-headedness and headaches.  Hematological: Negative for adenopathy. Does not bruise/bleed easily.  Psychiatric/Behavioral: Negative for agitation and dysphoric mood.       Objective:    Physical Exam  Constitutional: She is oriented to person, place, and time. She appears well-developed and well-nourished. No distress.  HENT:  Nose: Nose normal.  Mouth/Throat: Oropharynx is clear and moist.  Eyes: Right eye exhibits no discharge. Left eye exhibits no  discharge. No scleral icterus.  Neck: Neck supple. No thyromegaly present.  Cardiovascular: Normal rate and regular rhythm.  Pulmonary/Chest: Breath sounds normal. No accessory muscle usage. No tachypnea. No respiratory distress. She has no decreased breath sounds. She has no wheezes. She has no rhonchi. Right breast exhibits no inverted nipple, no mass, no nipple discharge and no tenderness (no axillary adenopathy). Left breast exhibits no inverted nipple, no mass, no nipple discharge and no tenderness (no axilarry adenopathy).  Abdominal: Soft. Bowel sounds are normal. There is no tenderness.  Musculoskeletal: She exhibits no edema or tenderness.  Lymphadenopathy:    She has no cervical adenopathy.  Neurological: She is alert and oriented to person, place, and time.  Skin: Skin is warm. No rash noted. No erythema.  Psychiatric: She has a normal mood and affect. Her behavior is normal.    BP 122/80 (BP Location: Left Arm, Patient Position: Sitting, Cuff Size: Large)   Pulse 93   Temp 98.3 F (36.8 C) (Oral)   Ht 5' 7.5" (1.715 m)   Wt 230 lb (104.3 kg)   BMI 35.49 kg/m  Wt Readings from Last 3 Encounters:  03/21/17 230 lb (104.3 kg)  02/24/17 225 lb (102.1 kg)  02/03/17 225 lb (102.1 kg)     Lab Results  Component Value Date   WBC 8.9 01/24/2017   HGB 12.3 01/24/2017   HCT 37.5 01/24/2017   PLT 315 01/24/2017   GLUCOSE 105 (H) 12/26/2016   CHOL 183 12/26/2016   TRIG 198.0 (H) 12/26/2016   HDL 45.80 12/26/2016   LDLDIRECT 121.0 11/16/2014   LDLCALC 97 12/26/2016   ALT 10 12/26/2016   AST 8 12/26/2016   NA 140 12/26/2016   K 4.1 12/26/2016   CL 103 12/26/2016   CREATININE 0.64 12/26/2016   BUN 11 12/26/2016   CO2 32 12/26/2016   TSH 0.54 12/26/2016   INR 0.99 03/08/2016   HGBA1C 5.4 04/07/2015    Dg Foot 2 Views Right  Result Date: 02/24/2017 CLINICAL DATA:  Initial evaluation status post attempted removal of foreign body. EXAM: RIGHT FOOT - 2 VIEW COMPARISON:   Prior radiograph from earlier same day. FINDINGS: Previously identified radiopaque foreign body remains within the soft tissues of the foot. Additional ovoid density suspected to reflect post treatment changes. Mild diffuse soft tissue swelling about the foot is similar. No  new osseous abnormality. IMPRESSION: Unchanged position and appearance of retained foreign body at the plantar aspect of the right foot. Electronically Signed   By: Jeannine Boga M.D.   On: 02/24/2017 18:15   Dg Foot Complete Right  Result Date: 02/24/2017 CLINICAL DATA:  Stepped on glass. EXAM: RIGHT FOOT COMPLETE - 3+ VIEW COMPARISON:  07/21/2014 FINDINGS: Metal wire in the proximal phalanx of the great toe is stable. No acute fracture. No dislocation. There is ankylosis of the PIP joints of the second, third, and fourth digits. There is a radiopaque foreign body within the soft tissues inferior to the metatarsophalangeal junction. This is only noted on the lateral view. There is soft tissue swelling about the forefoot. IMPRESSION: There is a radiopaque foreign body in the soft tissues below the metatarsophalangeal junction. This may represent broken glass within the soft tissues. No acute fracture. Electronically Signed   By: Marybelle Killings M.D.   On: 02/24/2017 16:48       Assessment & Plan:   Problem List Items Addressed This Visit    BMI 35.0-35.9,adult    Discussed diet and exercise.  Follow.        Depression    Seeing psychiatry.  On cymbalta.  Stable.        Edema    Lower extremity swelling better.        Essential hypertension, benign    Blood pressure under good control.  Continue same medication regimen.  Follow pressures.  Follow metabolic panel.        Relevant Orders   Basic metabolic panel   GERD (gastroesophageal reflux disease)    Controlled on protonix.        H/O gastric bypass    Seeing hematology.  Receives iron infusions.  Follow cbc.        Hypercholesterolemia    Low  cholesterol diet and exercise.  Follow lipid panel.        Relevant Orders   Hepatic function panel   Lipid panel   Hyperglycemia    Low carb diet and exercise.  Follow met b and a1c.        Relevant Orders   Hemoglobin A1c   Hypothyroidism    On thyroid replacement.  Follow tsh.        Melanoma in situ [facility])    Followed by dermatology.        Other Visit Diagnoses    Left shoulder pain, unspecified chronicity    -  Primary   pain as outlined.  previously saw Dr Roland Rack.  requested referral back.  hold on xray.     Relevant Orders   Ambulatory referral to Orthopedic Surgery       Einar Pheasant, MD

## 2017-03-23 ENCOUNTER — Encounter: Payer: Self-pay | Admitting: Internal Medicine

## 2017-03-23 DIAGNOSIS — R739 Hyperglycemia, unspecified: Secondary | ICD-10-CM | POA: Insufficient documentation

## 2017-03-23 NOTE — Assessment & Plan Note (Signed)
Followed by dermatology

## 2017-03-23 NOTE — Assessment & Plan Note (Signed)
Controlled on protonix.   

## 2017-03-23 NOTE — Assessment & Plan Note (Signed)
Seeing hematology.  Receives iron infusions.  Follow cbc.

## 2017-03-23 NOTE — Assessment & Plan Note (Signed)
Seeing psychiatry.  On cymbalta.  Stable.

## 2017-03-23 NOTE — Assessment & Plan Note (Signed)
On thyroid replacement.  Follow tsh.  

## 2017-03-23 NOTE — Assessment & Plan Note (Signed)
Lower extremity swelling better.

## 2017-03-23 NOTE — Assessment & Plan Note (Signed)
Low cholesterol diet and exercise.  Follow lipid panel.   

## 2017-03-23 NOTE — Assessment & Plan Note (Signed)
Blood pressure under good control.  Continue same medication regimen.  Follow pressures.  Follow metabolic panel.   

## 2017-03-23 NOTE — Assessment & Plan Note (Signed)
Discussed diet and exercise.  Follow.  

## 2017-03-23 NOTE — Assessment & Plan Note (Signed)
Low carb diet and exercise.  Follow met b and a1c.   

## 2017-04-10 ENCOUNTER — Other Ambulatory Visit (INDEPENDENT_AMBULATORY_CARE_PROVIDER_SITE_OTHER): Payer: Managed Care, Other (non HMO)

## 2017-04-10 DIAGNOSIS — R739 Hyperglycemia, unspecified: Secondary | ICD-10-CM

## 2017-04-10 DIAGNOSIS — I10 Essential (primary) hypertension: Secondary | ICD-10-CM

## 2017-04-10 DIAGNOSIS — E7800 Pure hypercholesterolemia, unspecified: Secondary | ICD-10-CM

## 2017-04-11 LAB — BASIC METABOLIC PANEL
BUN/Creatinine Ratio: 23 (ref 9–23)
BUN: 15 mg/dL (ref 6–24)
CALCIUM: 9 mg/dL (ref 8.7–10.2)
CHLORIDE: 96 mmol/L (ref 96–106)
CO2: 29 mmol/L (ref 20–29)
Creatinine, Ser: 0.65 mg/dL (ref 0.57–1.00)
GFR calc non Af Amer: 103 mL/min/{1.73_m2} (ref >59)
GFR, EST AFRICAN AMERICAN: 119 mL/min/{1.73_m2} (ref >59)
Glucose: 82 mg/dL (ref 65–99)
POTASSIUM: 3.7 mmol/L (ref 3.5–5.2)
Sodium: 140 mmol/L (ref 134–144)

## 2017-04-11 LAB — LIPID PANEL
Chol/HDL Ratio: 3 ratio (ref 0.0–4.4)
Cholesterol, Total: 202 mg/dL — ABNORMAL HIGH (ref 100–199)
HDL: 67 mg/dL (ref >39)
LDL Calculated: 82 mg/dL (ref 0–99)
Triglycerides: 265 mg/dL — ABNORMAL HIGH (ref 0–149)
VLDL Cholesterol Cal: 53 mg/dL — ABNORMAL HIGH (ref 5–40)

## 2017-04-11 LAB — HEPATIC FUNCTION PANEL
ALK PHOS: 54 IU/L (ref 39–117)
ALT: 12 IU/L (ref 0–32)
AST: 9 IU/L (ref 0–40)
Albumin: 4.2 g/dL (ref 3.5–5.5)
BILIRUBIN TOTAL: 0.3 mg/dL (ref 0.0–1.2)
BILIRUBIN, DIRECT: 0.09 mg/dL (ref 0.00–0.40)
TOTAL PROTEIN: 6.7 g/dL (ref 6.0–8.5)

## 2017-04-11 LAB — HEMOGLOBIN A1C
ESTIMATED AVERAGE GLUCOSE: 111 mg/dL
HEMOGLOBIN A1C: 5.5 % (ref 4.8–5.6)

## 2017-04-14 ENCOUNTER — Other Ambulatory Visit: Payer: Self-pay | Admitting: Obstetrics and Gynecology

## 2017-04-14 DIAGNOSIS — Z1231 Encounter for screening mammogram for malignant neoplasm of breast: Secondary | ICD-10-CM

## 2017-04-23 ENCOUNTER — Other Ambulatory Visit: Payer: Self-pay | Admitting: Internal Medicine

## 2017-04-28 ENCOUNTER — Other Ambulatory Visit: Payer: Self-pay | Admitting: Urgent Care

## 2017-04-28 ENCOUNTER — Inpatient Hospital Stay: Payer: Managed Care, Other (non HMO)

## 2017-04-28 ENCOUNTER — Encounter: Payer: Self-pay | Admitting: Urgent Care

## 2017-04-28 ENCOUNTER — Other Ambulatory Visit: Payer: Self-pay

## 2017-04-28 ENCOUNTER — Encounter: Payer: Self-pay | Admitting: Hematology and Oncology

## 2017-04-28 ENCOUNTER — Inpatient Hospital Stay: Payer: Managed Care, Other (non HMO) | Attending: Hematology and Oncology | Admitting: Hematology and Oncology

## 2017-04-28 VITALS — BP 128/82 | HR 94 | Temp 98.4°F | Wt 237.3 lb

## 2017-04-28 DIAGNOSIS — E282 Polycystic ovarian syndrome: Secondary | ICD-10-CM | POA: Diagnosis not present

## 2017-04-28 DIAGNOSIS — Z9049 Acquired absence of other specified parts of digestive tract: Secondary | ICD-10-CM | POA: Insufficient documentation

## 2017-04-28 DIAGNOSIS — R011 Cardiac murmur, unspecified: Secondary | ICD-10-CM | POA: Insufficient documentation

## 2017-04-28 DIAGNOSIS — M109 Gout, unspecified: Secondary | ICD-10-CM

## 2017-04-28 DIAGNOSIS — R600 Localized edema: Secondary | ICD-10-CM

## 2017-04-28 DIAGNOSIS — K219 Gastro-esophageal reflux disease without esophagitis: Secondary | ICD-10-CM | POA: Diagnosis not present

## 2017-04-28 DIAGNOSIS — K5900 Constipation, unspecified: Secondary | ICD-10-CM | POA: Diagnosis not present

## 2017-04-28 DIAGNOSIS — E079 Disorder of thyroid, unspecified: Secondary | ICD-10-CM | POA: Insufficient documentation

## 2017-04-28 DIAGNOSIS — Z9884 Bariatric surgery status: Secondary | ICD-10-CM | POA: Diagnosis not present

## 2017-04-28 DIAGNOSIS — Z85828 Personal history of other malignant neoplasm of skin: Secondary | ICD-10-CM

## 2017-04-28 DIAGNOSIS — D500 Iron deficiency anemia secondary to blood loss (chronic): Secondary | ICD-10-CM

## 2017-04-28 DIAGNOSIS — K921 Melena: Secondary | ICD-10-CM

## 2017-04-28 DIAGNOSIS — E039 Hypothyroidism, unspecified: Secondary | ICD-10-CM | POA: Diagnosis not present

## 2017-04-28 DIAGNOSIS — E069 Thyroiditis, unspecified: Secondary | ICD-10-CM | POA: Insufficient documentation

## 2017-04-28 DIAGNOSIS — D509 Iron deficiency anemia, unspecified: Secondary | ICD-10-CM | POA: Diagnosis not present

## 2017-04-28 DIAGNOSIS — I10 Essential (primary) hypertension: Secondary | ICD-10-CM | POA: Insufficient documentation

## 2017-04-28 DIAGNOSIS — E538 Deficiency of other specified B group vitamins: Secondary | ICD-10-CM | POA: Diagnosis not present

## 2017-04-28 DIAGNOSIS — F509 Eating disorder, unspecified: Secondary | ICD-10-CM

## 2017-04-28 DIAGNOSIS — L501 Idiopathic urticaria: Secondary | ICD-10-CM | POA: Insufficient documentation

## 2017-04-28 DIAGNOSIS — R609 Edema, unspecified: Secondary | ICD-10-CM | POA: Insufficient documentation

## 2017-04-28 DIAGNOSIS — E785 Hyperlipidemia, unspecified: Secondary | ICD-10-CM | POA: Diagnosis not present

## 2017-04-28 DIAGNOSIS — F329 Major depressive disorder, single episode, unspecified: Secondary | ICD-10-CM | POA: Insufficient documentation

## 2017-04-28 DIAGNOSIS — Z79899 Other long term (current) drug therapy: Secondary | ICD-10-CM | POA: Insufficient documentation

## 2017-04-28 DIAGNOSIS — Z803 Family history of malignant neoplasm of breast: Secondary | ICD-10-CM | POA: Diagnosis not present

## 2017-04-28 LAB — CBC WITH DIFFERENTIAL/PLATELET
Basophils Absolute: 0.1 10*3/uL (ref 0–0.1)
Basophils Relative: 1 %
Eosinophils Absolute: 0.1 10*3/uL (ref 0–0.7)
Eosinophils Relative: 2 %
HCT: 39.2 % (ref 35.0–47.0)
Hemoglobin: 12.8 g/dL (ref 12.0–16.0)
Lymphocytes Relative: 28 %
Lymphs Abs: 2 10*3/uL (ref 1.0–3.6)
MCH: 28.9 pg (ref 26.0–34.0)
MCHC: 32.8 g/dL (ref 32.0–36.0)
MCV: 88.3 fL (ref 80.0–100.0)
Monocytes Absolute: 0.5 10*3/uL (ref 0.2–0.9)
Monocytes Relative: 7 %
Neutro Abs: 4.4 10*3/uL (ref 1.4–6.5)
Neutrophils Relative %: 62 %
Platelets: 244 10*3/uL (ref 150–440)
RBC: 4.43 MIL/uL (ref 3.80–5.20)
RDW: 14.8 % — ABNORMAL HIGH (ref 11.5–14.5)
WBC: 7 10*3/uL (ref 3.6–11.0)

## 2017-04-28 LAB — FERRITIN: Ferritin: 20 ng/mL (ref 11–307)

## 2017-04-28 LAB — IRON AND TIBC
Iron: 47 ug/dL (ref 28–170)
Saturation Ratios: 13 % (ref 10.4–31.8)
TIBC: 358 ug/dL (ref 250–450)
UIBC: 311 ug/dL

## 2017-04-28 MED ORDER — CYANOCOBALAMIN 1000 MCG/ML IJ SOLN
1000.0000 ug | Freq: Once | INTRAMUSCULAR | Status: AC
  Administered 2017-04-28: 1000 ug via INTRAMUSCULAR
  Filled 2017-04-28: qty 1

## 2017-04-28 NOTE — Progress Notes (Signed)
[facility] day:  04/28/2017  Chief Complaint: Lori Le is a 52 y.o. female s/p gastric bypass surgery and subsequent iron deficiency anemia who is seen for 6 month assessment.  HPI:  The patient was last seen in the hematology clinic on 10/24/2016.  At that time, she felt "ok".  Exam revealed bilateral lower extremity edma (left > right).  Duplex was negative for DVT.  Hematocrit was 40.1.  Ferritin was 14.  B12 was 252 (low normal).  CBC on 12/26/2016 revealed a hematocrit of 37.5, hemoglobin 12.3, and MCV 85.7.  Ferritin was 8.   She received weekly Venofer x 3 (01/29/2017 - 02/12/2017).  She received B12 (10/31/2016, 12/05/2016, and 01/24/2017).  During the interim, patient been doing "ok". Patient notes that her energy level is "so so". Patient is eating well and making efforts to maintain an iron rich diet. Patient's weight is up 7 pounds. She continues to have some lower extremity edema, however she feels as if this has improved.   Patient having issues with constipation. Patient noted some blood in her stool yesterday. She has never had a colonoscopy.  Patient denies pain in the clinic today.    Past Medical History:  Diagnosis Date  . Allergic rhinitis due to allergen   . Allergy   . Anemia   . Arthritis   . Cancer ([place])    skin ca  . Chronic idiopathic urticaria   . Complication of anesthesia    nausea, slow to wake up  . Depression   . GERD (gastroesophageal reflux disease)   . Gout   . Gout   . Hashimoto's thyroiditis   . Heart murmur   . History of eating disorder   . Hives   . Hyperlipidemia   . Hypertension   . Hypothyroidism   . Lower extremity edema   . Melanoma in situ ([place])    left shoulder  . Migraine   . Mitral valve disorder   . Motion sickness    all moving vehicles  . PCOS (polycystic ovarian syndrome)   . PONV (postoperative nausea and vomiting)   . Thyroid disease   . Ulcer   . Vertigo     last episode over 1 yr ago    Past Surgical History:  Procedure Laterality Date  . BREAST BIOPSY Right    neg- bx/clip  . CHOLECYSTECTOMY  2003  . DILITATION & CURRETTAGE/HYSTROSCOPY WITH NOVASURE ABLATION N/A 08/19/2016   Procedure: DILATATION & CURETTAGE/HYSTEROSCOPY WITH NOVASURE ABLATION;  Surgeon: [name], MD;  Location: [facility] ORS;  Service: Gynecology;  Laterality: N/A;  . ESOPHAGOGASTRODUODENOSCOPY (EGD) WITH PROPOFOL N/A 12/05/2015   Procedure: ESOPHAGOGASTRODUODENOSCOPY (EGD) WITH PROPOFOL;  Surgeon: Lollie Sails, MD;  Location: [facility] ENDOSCOPY;  Service: Endoscopy;  Laterality: N/A;  . ESOPHAGOGASTRODUODENOSCOPY (EGD) WITH PROPOFOL N/A 02/06/2016   Procedure: ESOPHAGOGASTRODUODENOSCOPY (EGD) WITH PROPOFOL;  Surgeon: Lollie Sails, MD;  Location: [facility] ENDOSCOPY;  Service: Endoscopy;  Laterality: N/A;  . FOOT SURGERY  1994  . GASTRIC BYPASS  2011  . JOINT REPLACEMENT Left 09/27/2014   HIP  . JOINT REPLACEMENT Left 12/01/2014   KNEE  . KNEE ARTHROSCOPY Right   . KNEE ARTHROSCOPY Left 03/23/2015   Procedure: ARTHROSCOPY KNEE, partial synovectomy;  Surgeon: Hessie Knows, MD;  Location: [facility] ORS;  Service: Orthopedics;  Laterality: Left;  . LAPAROSCOPIC GASTRIC BANDING  2010  . ROTATOR CUFF REPAIR Bilateral    right shoulder 07-26-2016  . SHOULDER ARTHROSCOPY Left  06/20/2015   Procedure: ARTHROSCOPY SHOULDER, REPAIR OF MASSIVE ROTATOR CUFF TEAR, TENODESIS, DECOMPRESSION DEBRIDEMENT;  Surgeon: Corky Mull, MD;  Location: [facility] ORS;  Service: Orthopedics;  Laterality: Left;  . TOTAL HIP ARTHROPLASTY Left 09/27/2014   Procedure: TOTAL HIP ARTHROPLASTY ANTERIOR APPROACH;  Surgeon: Hessie Knows, MD;  Location: [facility] ORS;  Service: Orthopedics;  Laterality: Left;  . TOTAL KNEE ARTHROPLASTY Left 12/01/2014   Procedure: TOTAL KNEE ARTHROPLASTY;  Surgeon: Hessie Knows, MD;  Location: [facility] ORS;  Service: Orthopedics;  Laterality: Left;    Family History  Problem Relation Age of Onset   . Arthritis Mother   . Hyperlipidemia Mother   . Hypertension Mother   . Diabetes Mother   . Arthritis Father   . Hyperlipidemia Father   . Hypertension Father   . Mental illness Father   . Diabetes Father   . Arthritis Maternal Grandmother   . Cancer Maternal Grandmother        breast cancer  . Hyperlipidemia Maternal Grandmother   . Hypertension Maternal Grandmother   . Breast cancer Maternal Grandmother   . Arthritis Maternal Grandfather   . Hyperlipidemia Maternal Grandfather   . Hypertension Maternal Grandfather   . Heart disease Maternal Grandfather        heart attack  . Breast cancer Paternal Grandmother     Social History:  reports that  has never smoked. she has never used smokeless tobacco. She reports that she drinks alcohol. She reports that she does not use drugs.  The patient lives in [place].  The patient is alone today.  Allergies:  Allergies  Allergen Reactions  . Etodolac Anaphylaxis  . Influenza Vaccines Hives  . Lactose Nausea And Vomiting    Can have yogurt Can not have milk and ice cream   . Lactose Intolerance (Gi) Nausea And Vomiting    Can have yogurt Can not have milk and ice cream    . Bacitracin-Neomycin-Polymyxin Rash  . Neomycin-Bacitracin Zn-Polymyx Rash  . Neosporin + Pain Relief Max St [Neomy-Bacit-Polymyx-Pramoxine] Rash    Current Medications: Current Outpatient Medications  Medication Sig Dispense Refill  . acetaminophen (TYLENOL) 650 MG CR tablet Take 1,300 mg by mouth 2 (two) times daily.    . DULoxetine (CYMBALTA) 60 MG capsule Take 1 capsule (60 mg total) by mouth daily. 90 capsule 0  . furosemide (LASIX) 40 MG tablet TAKE 1 TABLET (40 MG TOTAL) BY MOUTH DAILY. 90 tablet 1  . levocetirizine (XYZAL) 5 MG tablet every evening.  3  . levothyroxine (SYNTHROID, LEVOTHROID) 137 MCG tablet TAKE ONE TABLET BY MOUTH ONCE DAILY BEFORE BREAKFAST (Patient taking differently: Take 137 mcg by mouth daily before breakfast. DAILY BEFORE  BREAKFAST) 90 tablet 0  . montelukast (SINGULAIR) 10 MG tablet Take 10 mg by mouth daily.  3  . mupirocin ointment (BACTROBAN) 2 % Place 1 application into the nose 2 (two) times daily. 22 g 0  . pantoprazole (PROTONIX) 40 MG tablet Take 1 tablet (40 mg total) by mouth daily. 30 tablet 2  . potassium chloride (KLOR-CON 10) 10 MEQ tablet TAKE ONE TABLET TWICE FOR THREE DAYS AND THEN DAILY. *MAINT REFILLS EXCEEDED* 30 tablet 2  . triamterene-hydrochlorothiazide (MAXZIDE-25) 37.5-25 MG tablet Take 1 tablet by mouth daily. 90 tablet 0   No current facility-administered medications for this visit.     Review of Systems:  GENERAL:  Feels "ok".  Energy level is "so-so".  No fevers or sweats.  Weight gain of 16 pounds since last visit. PERFORMANCE  STATUS (ECOG):  0 HEENT:  No visual changes, runny nose, sore throat, mouth sores or tenderness. Lungs: No shortness of breath or cough.  No hemoptysis. Cardiac:  No chest pain, palpitations, orthopnea, or PND. GI:  Constipation.  No nausea, vomiting, diarrhea, or melena.  Blood in stool. GU:  No urgency, frequency, dysuria, or hematuria. S/p uterine ablation.  No menses. Musculoskeletal:  No back pain.  Interval right rotator cuff surgery.  No muscle tenderness. Extremities:  No pain.  Lower extremity swelling. Skin:  No rashes or skin changes. Neuro:  No headache, numbness or weakness, balance or coordination issues.  Restless legs, resolved. Endocrine:  No diabetes.  Thyroid disease on Synthroid.  No hot flashes or night sweats. Psych:  No mood changes, depression or anxiety. Pain:  No focal pain. Review of systems:  All other systems reviewed and found to be negative.  Physical Exam: Blood pressure 128/82, pulse 94, temperature 98.4 F (36.9 C), temperature source Tympanic, weight 237 lb 4.8 oz (107.6 kg). GENERAL:  Well developed, well nourished, tall woman sitting comfortably in the exam room in no acute distress. MENTAL STATUS:  Alert and  oriented to person, place and time. HEAD:  Short brown hair.  Normocephalic, atraumatic, face symmetric, no Cushingoid features. EYES:  Blue eyes.  Pupils equal round and reactive to light and accomodation.  No conjunctivitis or scleral icterus. ENT:  Oropharynx clear without lesion.  Tongue normal.  Mucous membranes moist.  RESPIRATORY:  Clear to auscultation without rales, wheezes or rhonchi. CARDIOVASCULAR:  Regular rate and rhythm without murmur, rub or gallop. ABDOMEN:  Soft, non-tender, with active bowel sounds, and no appreciable hepatosplenomegaly.  No masses. SKIN:  No rashes, ulcers or lesions. EXTREMITIES: Bilateral lower extremity edema (left > right).  No skin discoloration or tenderness.  No palpable cords. LYMPH NODES: No palpable cervical, supraclavicular, axillary or inguinal adenopathy  NEUROLOGICAL: Unremarkable. PSYCH:  Appropriate.   Appointment on 04/28/2017  Component Date Value Ref Range Status  . WBC 04/28/2017 7.0  3.6 - 11.0 K/uL Final  . RBC 04/28/2017 4.43  3.80 - 5.20 MIL/uL Final  . Hemoglobin 04/28/2017 12.8  12.0 - 16.0 g/dL Final  . HCT 04/28/2017 39.2  35.0 - 47.0 % Final  . MCV 04/28/2017 88.3  80.0 - 100.0 fL Final  . MCH 04/28/2017 28.9  26.0 - 34.0 pg Final  . MCHC 04/28/2017 32.8  32.0 - 36.0 g/dL Final  . RDW 04/28/2017 14.8* 11.5 - 14.5 % Final  . Platelets 04/28/2017 244  150 - 440 K/uL Final  . Neutrophils Relative % 04/28/2017 62  % Final  . Neutro Abs 04/28/2017 4.4  1.4 - 6.5 K/uL Final  . Lymphocytes Relative 04/28/2017 28  % Final  . Lymphs Abs 04/28/2017 2.0  1.0 - 3.6 K/uL Final  . Monocytes Relative 04/28/2017 7  % Final  . Monocytes Absolute 04/28/2017 0.5  0.2 - 0.9 K/uL Final  . Eosinophils Relative 04/28/2017 2  % Final  . Eosinophils Absolute 04/28/2017 0.1  0 - 0.7 K/uL Final  . Basophils Relative 04/28/2017 1  % Final  . Basophils Absolute 04/28/2017 0.1  0 - 0.1 K/uL Final   Performed at [facility], [address] [place], [place] [address]    Assessment:  SAIRAH KNOBLOCH is a 52 y.o. female s/p gastric bypass surgery (2011) with iron deficiency anemia. She has a history of bleeding ulcer in 10/2015.  She had heavy menses.  She underwent uterine ablation  on 08/19/2016.  She bruises easily.  EGD on 12/05/2015 revealed edema and erythema of the gastrojejunal anastomosis. She was treated with Carafate and Protonix.  Repeat EGD on 02/06/2016 revealed minimal erythema on the tips of some gastric folds.  She has never had a colonoscopy.  Diet is good.  She denies any ice pica.  She is intolerant of oral iron secondary to severe constipation.  She has B12 deficiency.  B12 was 168 on 12/29/2012, 367 on 11/29/2015, and 252 on 10/24/2016.  She restarted B12 (last 01/24/2017).  Folate was 30 on 10/24/2016.  Workup on 03/08/2016 revealed a hematocrit of 30.1, hemoglobin 9.1, MCV 70.7, platelets 331,000, WBC 8200 with an ANC of 5800.  Ferritin was 5.  PT and PTT were normal.  Platelet function assay was normal.  She received Venofer 200 mg IV weekly x 4 (03/15/2016 - 04/05/2016), x 2 (07/23/2016 and 08/05/2016), and x 3 (01/29/2017 - 02/12/2017).  She receives Venofer if her ferritin is < 30.  She has a family history of breast cancer.  Screening bilateral mammogram on 05/08/2016 revealed no evidence of malignancy.  Symptomatically, she feels "ok".  She has noted some blood in her stool.  Exam reveals stable bilateral lower extremity edema (left > right).  Hematocrit is 39.2.  Ferritin is 20.  Plan: 1.  Labs today:  CBC with diff, ferritin, iron studies. 2.  B12 injection today, then monthly x 5.  3.  Refer to GI for a colonoscopy. 4.  RTC in 3 months for labs (CBC with diff, ferritin) 5.  RTC in 6 months for MD assessment, labs (CBC with diff, iron studies, ferritin - the day before) +/- IV iron.  Addendum:  Patient notified of ferritin level.  She will receive Venofer weekly x 2.   Honor Loh, NP   04/28/2017, 2:14 PM   I saw and evaluated the patient, participating in the key portions of the service and reviewing pertinent diagnostic studies and records.  I reviewed the nurse practitioner's note and agree with the findings and the plan.  The assessment and plan were discussed with the patient.  Several questions were asked by the patient and answered.   Nolon Stalls, MD 04/28/2017,2:14 PM

## 2017-04-29 ENCOUNTER — Telehealth: Payer: Self-pay | Admitting: *Deleted

## 2017-04-29 NOTE — Telephone Encounter (Signed)
Per Gaspar Bidding 04/28/17 staff message schedule weekly Venofer x2   Patient is scheduled for 05/01/17 and 05/08/17 She was made aware of date and time of appt.Lori Le

## 2017-04-30 ENCOUNTER — Other Ambulatory Visit: Payer: Self-pay

## 2017-04-30 ENCOUNTER — Encounter: Payer: Self-pay | Admitting: Gastroenterology

## 2017-04-30 ENCOUNTER — Ambulatory Visit: Payer: Managed Care, Other (non HMO) | Admitting: Gastroenterology

## 2017-04-30 VITALS — BP 113/79 | HR 85 | Ht 67.5 in | Wt 235.6 lb

## 2017-04-30 DIAGNOSIS — Z9884 Bariatric surgery status: Secondary | ICD-10-CM

## 2017-04-30 DIAGNOSIS — D508 Other iron deficiency anemias: Secondary | ICD-10-CM

## 2017-04-30 NOTE — Patient Instructions (Signed)
F/U 3 months  Will contact pt for possible colonoscopy/egd for 05/21/16, when the surgery schedule opens up. Pt aware.

## 2017-05-01 ENCOUNTER — Inpatient Hospital Stay: Payer: Managed Care, Other (non HMO)

## 2017-05-01 VITALS — BP 120/78 | HR 68 | Temp 97.6°F | Resp 18

## 2017-05-01 DIAGNOSIS — D509 Iron deficiency anemia, unspecified: Secondary | ICD-10-CM | POA: Diagnosis not present

## 2017-05-01 DIAGNOSIS — D500 Iron deficiency anemia secondary to blood loss (chronic): Secondary | ICD-10-CM

## 2017-05-01 MED ORDER — IRON SUCROSE 20 MG/ML IV SOLN
200.0000 mg | Freq: Once | INTRAVENOUS | Status: AC
  Administered 2017-05-01: 200 mg via INTRAVENOUS
  Filled 2017-05-01: qty 10

## 2017-05-01 NOTE — Progress Notes (Signed)
Lori Le, [place] [address]  Main: [phone]  Fax: [phone]   Gastroenterology Consultation  Referring Provider:     Einar Pheasant, MD Primary Care Physician:  Einar Pheasant, MD Primary Gastroenterologist:  Dr. Vonda Antigua Reason for Consultation:    Iron deficiency anemia        HPI:   Lori Le is a 52 y.o. y/o female referred for consultation & management  by Dr. Einar Pheasant, MD.  Patient sees hematology due to iron deficiency anemia IV iron replacement.  She has history of gastric bypass and was initially given oral, she did not tolerate this due to constipation and was transitioned to IV replacement.  She denies abdominal pain, weight loss, dysphagia, nausea or vomiting.  Reports intermittent bright red blood per rectum just on the toilet paper.  Hemoglobin is 12.8.  Last ferritin was 20, but was 8, 3 months ago.  Patient denies any NSAID use.  No family history of colon cancer.  Patient has never had a colonoscopy.  Had an EGD in September 2017 obtained by Dr. Gustavo Lah for melena.  8.5 cm gastric pouch was reported.  Erythema and edema were reported at the gastrojejunal anastomosis.  EGD was repeated in November 2017 and reported healthy appearing anastomosis.  Biopsies showed reactive gastropathy.  Past Medical History:  Diagnosis Date  . Allergic rhinitis due to allergen   . Allergy   . Anemia   . Arthritis   . Cancer ([place])    skin ca  . Chronic idiopathic urticaria   . Complication of anesthesia    nausea, slow to wake up  . Depression   . GERD (gastroesophageal reflux disease)   . Gout   . Gout   . Hashimoto's thyroiditis   . Heart murmur   . History of eating disorder   . Hives   . Hyperlipidemia   . Hypertension   . Hypothyroidism   . Lower extremity edema   . Melanoma in situ ([place])    left shoulder  . Migraine   . Mitral valve disorder   . Motion sickness    all moving vehicles    . PCOS (polycystic ovarian syndrome)   . PONV (postoperative nausea and vomiting)   . Thyroid disease   . Ulcer   . Vertigo    last episode over 1 yr ago    Past Surgical History:  Procedure Laterality Date  . BREAST BIOPSY Right    neg- bx/clip  . CHOLECYSTECTOMY  2003  . DILITATION & CURRETTAGE/HYSTROSCOPY WITH NOVASURE ABLATION N/A 08/19/2016   Procedure: DILATATION & CURETTAGE/HYSTEROSCOPY WITH NOVASURE ABLATION;  Surgeon: [name], MD;  Location: [facility] ORS;  Service: Gynecology;  Laterality: N/A;  . ESOPHAGOGASTRODUODENOSCOPY (EGD) WITH PROPOFOL N/A 12/05/2015   Procedure: ESOPHAGOGASTRODUODENOSCOPY (EGD) WITH PROPOFOL;  Surgeon: Lollie Sails, MD;  Location: [facility] ENDOSCOPY;  Service: Endoscopy;  Laterality: N/A;  . ESOPHAGOGASTRODUODENOSCOPY (EGD) WITH PROPOFOL N/A 02/06/2016   Procedure: ESOPHAGOGASTRODUODENOSCOPY (EGD) WITH PROPOFOL;  Surgeon: Lollie Sails, MD;  Location[facility] ENDOSCOPY;  Service: Endoscopy;  Laterality: N/A;  . FOOT SURGERY  1994  . GASTRIC BYPASS  2011  . JOINT REPLACEMENT Left 09/27/2014   HIP  . JOINT REPLACEMENT Left 12/01/2014   KNEE  . KNEE ARTHROSCOPY Right   . KNEE ARTHROSCOPY Left 03/23/2015   Procedure: ARTHROSCOPY KNEE, partial synovectomy;  Surgeon: Hessie Knows, MD;  Location: [facility] ORS;  Service: Orthopedics;  Laterality: Left;  . LAPAROSCOPIC  GASTRIC BANDING  2010  . ROTATOR CUFF REPAIR Bilateral    right shoulder 07-26-2016  . SHOULDER ARTHROSCOPY Left 06/20/2015   Procedure: ARTHROSCOPY SHOULDER, REPAIR OF MASSIVE ROTATOR CUFF TEAR, TENODESIS, DECOMPRESSION DEBRIDEMENT;  Surgeon: Corky Mull, MD;  Location: [facility] ORS;  Service: Orthopedics;  Laterality: Left;  . TOTAL HIP ARTHROPLASTY Left 09/27/2014   Procedure: TOTAL HIP ARTHROPLASTY ANTERIOR APPROACH;  Surgeon: Hessie Knows, MD;  Location: [facility] ORS;  Service: Orthopedics;  Laterality: Left;  . TOTAL KNEE ARTHROPLASTY Left 12/01/2014   Procedure: TOTAL KNEE ARTHROPLASTY;  Surgeon:  Hessie Knows, MD;  Location: [facility] ORS;  Service: Orthopedics;  Laterality: Left;    Prior to Admission medications   Medication Sig Start Date End Date Taking? Authorizing Provider  acetaminophen (TYLENOL) 650 MG CR tablet Take 1,300 mg by mouth 2 (two) times daily.   Yes [provider]  DULoxetine (CYMBALTA) 60 MG capsule Take 1 capsule (60 mg total) by mouth daily. 12/24/16  Yes Arfeen, Arlyce Harman, MD  furosemide (LASIX) 40 MG tablet TAKE 1 TABLET (40 MG TOTAL) BY MOUTH DAILY. 04/24/17  Yes Einar Pheasant, MD  levocetirizine (XYZAL) 5 MG tablet every evening. 04/23/17  Yes [provider]  levothyroxine (SYNTHROID, LEVOTHROID) 137 MCG tablet TAKE ONE TABLET BY MOUTH ONCE DAILY BEFORE BREAKFAST Patient taking differently: Take 137 mcg by mouth daily before breakfast. DAILY BEFORE BREAKFAST 09/11/15  Yes Einar Pheasant, MD  montelukast (SINGULAIR) 10 MG tablet Take 10 mg by mouth daily. 04/23/17  Yes [provider]  mupirocin ointment (BACTROBAN) 2 % Place 1 application into the nose 2 (two) times daily. 03/21/17  Yes Einar Pheasant, MD  pantoprazole (PROTONIX) 40 MG tablet Take 1 tablet (40 mg total) by mouth daily. 12/12/16  Yes Einar Pheasant, MD  potassium chloride (KLOR-CON 10) 10 MEQ tablet TAKE ONE TABLET TWICE FOR THREE DAYS AND THEN DAILY. *MAINT REFILLS EXCEEDED* 02/04/17  Yes Einar Pheasant, MD  triamterene-hydrochlorothiazide (MAXZIDE-25) 37.5-25 MG tablet Take 1 tablet by mouth daily. 11/13/16  Yes Einar Pheasant, MD    Family History  Problem Relation Age of Onset  . Arthritis Mother   . Hyperlipidemia Mother   . Hypertension Mother   . Diabetes Mother   . Arthritis Father   . Hyperlipidemia Father   . Hypertension Father   . Mental illness Father   . Diabetes Father   . Arthritis Maternal Grandmother   . Cancer Maternal Grandmother        breast cancer  . Hyperlipidemia Maternal Grandmother   . Hypertension Maternal Grandmother   . Breast cancer  Maternal Grandmother   . Arthritis Maternal Grandfather   . Hyperlipidemia Maternal Grandfather   . Hypertension Maternal Grandfather   . Heart disease Maternal Grandfather        heart attack  . Breast cancer Paternal Grandmother      Social History   Tobacco Use  . Smoking status: Never Smoker  . Smokeless tobacco: Never Used  Substance Use Topics  . Alcohol use: Yes    Alcohol/week: 0.0 - 0.6 oz    Comment: occ.  . Drug use: No    Allergies as of 04/30/2017 - Review Complete 04/30/2017  Allergen Reaction Noted  . Etodolac Anaphylaxis 02/05/2017  . Influenza vaccines Hives 12/09/2013  . Lactose Nausea And Vomiting 04/25/2015  . Lactose intolerance (gi) Nausea And Vomiting 09/27/2014  . Bacitracin-neomycin-polymyxin Rash 04/25/2015  . Neomycin-bacitracin zn-polymyx Rash   . Neosporin + pain relief max st [neomy-bacit-polymyx-pramoxine] Rash 07/31/2012  Review of Systems:    All systems reviewed and negative except where noted in HPI.   Physical Exam:  BP 113/79   Pulse 85   Ht 5' 7.5" (1.715 m)   Wt 235 lb 9.6 oz (106.9 kg)   BMI 36.36 kg/m  No LMP recorded. Patient has had an ablation. Psych:  Alert and cooperative. Normal mood and affect. General:   Alert,  Well-developed, well-nourished, pleasant and cooperative in NAD Head:  Normocephalic and atraumatic. Eyes:  Sclera clear, no icterus.   Conjunctiva pink. Ears:  Normal auditory acuity. Nose:  No deformity, discharge, or lesions. Mouth:  No deformity or lesions,oropharynx pink & moist. Neck:  Supple; no masses or thyromegaly. Lungs:  Respirations even and unlabored.  Clear throughout to auscultation.   No wheezes, crackles, or rhonchi. No acute distress. Heart:  Regular rate and rhythm; no murmurs, clicks, rubs, or gallops. Abdomen:  Normal bowel sounds.  No bruits.  Soft, non-tender and non-distended without masses, hepatosplenomegaly or hernias noted.  No guarding or rebound tenderness.    Msk:   Symmetrical without gross deformities. Good, equal movement & strength bilaterally. Pulses:  Normal pulses noted. Extremities:  No clubbing or edema.  No cyanosis. Neurologic:  Alert and oriented x3;  grossly normal neurologically. Skin:  Intact without significant lesions or rashes. No jaundice. Lymph Nodes:  No significant cervical adenopathy. Psych:  Alert and cooperative. Normal mood and affect.   Labs: CBC    Component Value Date/Time   WBC 7.0 04/28/2017 1345   RBC 4.43 04/28/2017 1345   HGB 12.8 04/28/2017 1345   HGB 13.1 04/07/2015 0938   HCT 39.2 04/28/2017 1345   HCT 40.4 04/07/2015 0938   PLT 244 04/28/2017 1345   PLT 300 04/07/2015 0938   MCV 88.3 04/28/2017 1345   MCV 85 04/07/2015 0938   MCH 28.9 04/28/2017 1345   MCHC 32.8 04/28/2017 1345   RDW 14.8 (H) 04/28/2017 1345   RDW 16.9 (H) 04/07/2015 0938   LYMPHSABS 2.0 04/28/2017 1345   LYMPHSABS 1.8 04/07/2015 0938   MONOABS 0.5 04/28/2017 1345   EOSABS 0.1 04/28/2017 1345   EOSABS 0.2 04/07/2015 0938   BASOSABS 0.1 04/28/2017 1345   BASOSABS 0.1 04/07/2015 0938   CMP     Component Value Date/Time   NA 140 04/10/2017 0821   K 3.7 04/10/2017 0821   K 4.0 09/27/2014   CL 96 04/10/2017 0821   CO2 29 04/10/2017 0821   GLUCOSE 82 04/10/2017 0821   GLUCOSE 105 (H) 12/26/2016 0805   BUN 15 04/10/2017 0821   CREATININE 0.65 04/10/2017 0821   CALCIUM 9.0 04/10/2017 0821   PROT 6.7 04/10/2017 0821   ALBUMIN 4.2 04/10/2017 0821   AST 9 04/10/2017 0821   ALT 12 04/10/2017 0821   ALKPHOS 54 04/10/2017 0821   BILITOT 0.3 04/10/2017 0821   GFRNONAA 103 04/10/2017 0821   GFRAA 119 04/10/2017 0821    Imaging Studies: No results found.  Assessment and Plan:   Lori Le is a 52 y.o. y/o female has been referred for iron deficiency anemia with history of gastric bypass  Patient is iron deficiency anemia is likely due to her gastric bypass However, she is above 50 and has never had a screening  colonoscopy.  Therefore, average risk screening colonoscopy is indicated at this time.  It would also allow for evaluation of iron deficiency anemia.  EGD is also indicated due to iron deficiency anemia.  Would allow for evaluation of  anastomotic/marginal ulcers.  Patient was encouraged to continue to avoid NSAIDs Continue follow-up with hematology for iron infusions Ferritin improving after iron infusions No alarm symptoms present at this time  I have discussed alternative options, risks & benefits,  which include, but are not limited to, bleeding, infection, perforation,respiratory complication & drug reaction.  The patient agrees with this plan & written consent will be obtained.    Dr Lori Antigua

## 2017-05-06 ENCOUNTER — Ambulatory Visit (INDEPENDENT_AMBULATORY_CARE_PROVIDER_SITE_OTHER): Payer: Managed Care, Other (non HMO) | Admitting: Obstetrics and Gynecology

## 2017-05-06 ENCOUNTER — Other Ambulatory Visit: Payer: Self-pay

## 2017-05-06 ENCOUNTER — Encounter: Payer: Self-pay | Admitting: Obstetrics and Gynecology

## 2017-05-06 VITALS — BP 127/77 | HR 78 | Wt 241.0 lb

## 2017-05-06 DIAGNOSIS — Z01419 Encounter for gynecological examination (general) (routine) without abnormal findings: Secondary | ICD-10-CM | POA: Diagnosis not present

## 2017-05-06 DIAGNOSIS — Z9889 Other specified postprocedural states: Secondary | ICD-10-CM

## 2017-05-06 DIAGNOSIS — N939 Abnormal uterine and vaginal bleeding, unspecified: Secondary | ICD-10-CM | POA: Diagnosis not present

## 2017-05-06 DIAGNOSIS — Z1211 Encounter for screening for malignant neoplasm of colon: Secondary | ICD-10-CM

## 2017-05-06 DIAGNOSIS — E669 Obesity, unspecified: Secondary | ICD-10-CM

## 2017-05-06 DIAGNOSIS — E785 Hyperlipidemia, unspecified: Secondary | ICD-10-CM | POA: Diagnosis not present

## 2017-05-06 DIAGNOSIS — Z8719 Personal history of other diseases of the digestive system: Secondary | ICD-10-CM

## 2017-05-06 DIAGNOSIS — Z8711 Personal history of peptic ulcer disease: Secondary | ICD-10-CM

## 2017-05-06 DIAGNOSIS — D508 Other iron deficiency anemias: Secondary | ICD-10-CM

## 2017-05-06 DIAGNOSIS — K219 Gastro-esophageal reflux disease without esophagitis: Secondary | ICD-10-CM

## 2017-05-06 NOTE — Patient Instructions (Signed)

## 2017-05-06 NOTE — Progress Notes (Signed)
ANNUAL PREVENTATIVE CARE GYN  ENCOUNTER NOTE  Subjective:       Lori Le is a 52 y.o. P0 female here for a routine annual gynecologic exam.  Current complaints: notes episode of bleeding, dark brown blood, light, lasting 2-3 days.  Has h/o endometrial ablation last year.  The patient is not currently sexually active.  The patient does wear seatbelts.  She denies history of tattoos or blood transfusions.     Gynecologic History No LMP recorded. Patient has had an ablation. Contraception: None Last Pap: 12/2013. Results were: normal.  Denies h/o abnormal pap smears.  Last mammogram: 05/2016. Results were: normal. Scheduled for 05/2017.  Denies h/o STIs.   Last colonoscopy: Scheduled for March 2019   Obstetric History OB History  Gravida Para Term Preterm AB Living  0 0 0 0 0 0  SAB TAB Ectopic Multiple Live Births  0 0 0 0 0        Past Medical History:  Diagnosis Date  . Allergic rhinitis due to allergen   . Allergy   . Anemia   . Arthritis   . Cancer ([place])    skin ca  . Chronic idiopathic urticaria   . Complication of anesthesia    nausea, slow to wake up  . Depression   . GERD (gastroesophageal reflux disease)   . Gout   . Gout   . Hashimoto's thyroiditis   . Heart murmur   . History of eating disorder   . Hives   . Hyperlipidemia   . Hypertension   . Hypothyroidism   . Lower extremity edema   . Melanoma in situ ([place])    left shoulder  . Migraine   . Mitral valve disorder   . Motion sickness    all moving vehicles  . PCOS (polycystic ovarian syndrome)   . PONV (postoperative nausea and vomiting)   . Thyroid disease   . Ulcer   . Vertigo    last episode over 1 yr ago    Past Surgical History:  Procedure Laterality Date  . BREAST BIOPSY Right    neg- bx/clip  . CHOLECYSTECTOMY  2003  . DILITATION & CURRETTAGE/HYSTROSCOPY WITH NOVASURE ABLATION N/A 08/19/2016   Procedure: DILATATION & CURETTAGE/HYSTEROSCOPY WITH NOVASURE ABLATION;  Surgeon:  [name], MD;  Location: [facility] ORS;  Service: Gynecology;  Laterality: N/A;  . ESOPHAGOGASTRODUODENOSCOPY (EGD) WITH PROPOFOL N/A 12/05/2015   Procedure: ESOPHAGOGASTRODUODENOSCOPY (EGD) WITH PROPOFOL;  Surgeon: Lollie Sails, MD;  Location: [facility] ENDOSCOPY;  Service: Endoscopy;  Laterality: N/A;  . ESOPHAGOGASTRODUODENOSCOPY (EGD) WITH PROPOFOL N/A 02/06/2016   Procedure: ESOPHAGOGASTRODUODENOSCOPY (EGD) WITH PROPOFOL;  Surgeon: Lollie Sails, MD;  Location: [facility] ENDOSCOPY;  Service: Endoscopy;  Laterality: N/A;  . FOOT SURGERY  1994  . GASTRIC BYPASS  2011  . JOINT REPLACEMENT Left 09/27/2014   HIP  . JOINT REPLACEMENT Left 12/01/2014   KNEE  . KNEE ARTHROSCOPY Right   . KNEE ARTHROSCOPY Left 03/23/2015   Procedure: ARTHROSCOPY KNEE, partial synovectomy;  Surgeon: Hessie Knows, MD;  Location: [facility] ORS;  Service: Orthopedics;  Laterality: Left;  . LAPAROSCOPIC GASTRIC BANDING  2010  . ROTATOR CUFF REPAIR Bilateral    right shoulder 07-26-2016  . SHOULDER ARTHROSCOPY Left 06/20/2015   Procedure: ARTHROSCOPY SHOULDER, REPAIR OF MASSIVE ROTATOR CUFF TEAR, TENODESIS, DECOMPRESSION DEBRIDEMENT;  Surgeon: Corky Mull, MD;  Location: [facility] ORS;  Service: Orthopedics;  Laterality: Left;  . TOTAL HIP ARTHROPLASTY Left 09/27/2014   Procedure: TOTAL HIP ARTHROPLASTY  ANTERIOR APPROACH;  Surgeon: Hessie Knows, MD;  Location: [facility] ORS;  Service: Orthopedics;  Laterality: Left;  . TOTAL KNEE ARTHROPLASTY Left 12/01/2014   Procedure: TOTAL KNEE ARTHROPLASTY;  Surgeon: Hessie Knows, MD;  Location: [facility] ORS;  Service: Orthopedics;  Laterality: Left;    Current Outpatient Medications on File Prior to Visit  Medication Sig Dispense Refill  . acetaminophen (TYLENOL) 650 MG CR tablet Take 1,300 mg by mouth 2 (two) times daily.    . DULoxetine (CYMBALTA) 60 MG capsule Take 1 capsule (60 mg total) by mouth daily. 90 capsule 0  . furosemide (LASIX) 40 MG tablet TAKE 1 TABLET (40 MG TOTAL) BY MOUTH DAILY. 90  tablet 1  . levocetirizine (XYZAL) 5 MG tablet every evening.  3  . levothyroxine (SYNTHROID, LEVOTHROID) 137 MCG tablet TAKE ONE TABLET BY MOUTH ONCE DAILY BEFORE BREAKFAST (Patient taking differently: Take 137 mcg by mouth daily before breakfast. DAILY BEFORE BREAKFAST) 90 tablet 0  . montelukast (SINGULAIR) 10 MG tablet Take 10 mg by mouth daily.  3  . mupirocin ointment (BACTROBAN) 2 % Place 1 application into the nose 2 (two) times daily. 22 g 0  . pantoprazole (PROTONIX) 40 MG tablet Take 1 tablet (40 mg total) by mouth daily. 30 tablet 2  . potassium chloride (KLOR-CON 10) 10 MEQ tablet TAKE ONE TABLET TWICE FOR THREE DAYS AND THEN DAILY. *MAINT REFILLS EXCEEDED* 30 tablet 2  . triamterene-hydrochlorothiazide (MAXZIDE-25) 37.5-25 MG tablet Take 1 tablet by mouth daily. 90 tablet 0   No current facility-administered medications on file prior to visit.     Allergies  Allergen Reactions  . Etodolac Anaphylaxis  . Influenza Vaccines Hives  . Lactose Nausea And Vomiting    Can have yogurt Can not have milk and ice cream   . Lactose Intolerance (Gi) Nausea And Vomiting    Can have yogurt Can not have milk and ice cream    . Bacitracin-Neomycin-Polymyxin Rash  . Neomycin-Bacitracin Zn-Polymyx Rash  . Neosporin + Pain Relief Max St [Neomy-Bacit-Polymyx-Pramoxine] Rash    Social History   Socioeconomic History  . Marital status: Divorced    Spouse name: Not on file  . Number of children: Not on file  . Years of education: Not on file  . Highest education level: Not on file  Social Needs  . Financial resource strain: Not on file  . Food insecurity - worry: Not on file  . Food insecurity - inability: Not on file  . Transportation needs - medical: Not on file  . Transportation needs - non-medical: Not on file  Occupational History  . Not on file  Tobacco Use  . Smoking status: Never Smoker  . Smokeless tobacco: Never Used  Substance and Sexual Activity  . Alcohol use: Yes      Alcohol/week: 0.0 - 0.6 oz    Comment: occ.  . Drug use: No  . Sexual activity: Yes    Birth control/protection: Surgical    Comment: Ablation   Other Topics Concern  . Not on file  Social History Narrative  . Not on file    Family History  Problem Relation Age of Onset  . Arthritis Mother   . Hyperlipidemia Mother   . Hypertension Mother   . Diabetes Mother   . Arthritis Father   . Hyperlipidemia Father   . Hypertension Father   . Mental illness Father   . Diabetes Father   . Arthritis Maternal Grandmother   . Cancer Maternal Grandmother  breast cancer  . Hyperlipidemia Maternal Grandmother   . Hypertension Maternal Grandmother   . Breast cancer Maternal Grandmother   . Arthritis Maternal Grandfather   . Hyperlipidemia Maternal Grandfather   . Hypertension Maternal Grandfather   . Heart disease Maternal Grandfather        heart attack  . Breast cancer Paternal Grandmother     The following portions of the patient's history were reviewed and updated as appropriate: allergies, current medications, past family history, past medical history, past social history, past surgical history and problem list.  Review of Systems ROS Review of Systems - General ROS: negative for - chills, fatigue, fever, hot flashes, night sweats, weight gain or weight loss Psychological ROS: negative for - anxiety, decreased libido, depression, mood swings, physical abuse or sexual abuse Ophthalmic ROS: negative for - blurry vision, eye pain or loss of vision ENT ROS: negative for - headaches, hearing change, visual changes or vocal changes Allergy and Immunology ROS: negative for - hives, itchy/watery eyes or seasonal allergies Hematological and Lymphatic ROS: negative for - bleeding problems, bruising, swollen lymph nodes or weight loss Endocrine ROS: negative for - galactorrhea, hair pattern changes, hot flashes, malaise/lethargy, mood swings, palpitations, polydipsia/polyuria, skin  changes, temperature intolerance or unexpected weight changes Breast ROS: negative for - new or changing breast lumps or nipple discharge Respiratory ROS: negative for - cough or shortness of breath Cardiovascular ROS: negative for - chest pain, irregular heartbeat, palpitations or shortness of breath Gastrointestinal ROS: no abdominal pain, change in bowel habits, or black or bloody stools Genito-Urinary NAT:FTDDUKGU for 1 episode of dark brown light bleeding/spotting lasting 2-3 days.  Denies dysuria, trouble voiding, or hematuria.   Musculoskeletal ROS: negative for - joint pain or joint stiffness Neurological ROS: negative for - bowel and bladder control changes Dermatological ROS: negative for rash and skin lesion changes   Objective:   BP 127/77   Pulse 78   Wt 241 lb (109.3 kg)   BMI 37.19 kg/m  CONSTITUTIONAL: Well-developed, well-nourished female in no acute distress.  PSYCHIATRIC: Normal mood and affect. Normal behavior. Normal judgment and thought content. [place]: Alert and oriented to person, place, and time. Normal muscle tone coordination. No cranial nerve deficit noted. HENT:  Normocephalic, atraumatic, External right and left ear normal. Oropharynx is clear and moist EYES: Conjunctivae and EOM are normal. Pupils are equal, round, and reactive to light. No scleral icterus.  NECK: Normal range of motion, supple, no masses.  Normal thyroid.  SKIN: Skin is warm and dry. No rash noted. Not diaphoretic. No erythema. No pallor. CARDIOVASCULAR: Normal heart rate noted, regular rhythm, no murmur. RESPIRATORY: Clear to auscultation bilaterally. Effort and breath sounds normal, no problems with respiration noted. BREASTS: Symmetric in size. No masses, skin changes, nipple drainage, or lymphadenopathy. ABDOMEN: Soft, normal bowel sounds, no distention noted.  No tenderness, rebound or guarding.  BLADDER: Normal PELVIC:  Bladder: no bladder distension noted  Vulva: normal  appearing vulva with no masses, tenderness or lesions  Vagina: normal appearing vagina with normal color and discharge, no lesions.  Mildly atrophic.  Cervix: normal appearing cervix without discharge or lesions;   Uterus: uterus is normal size, shape, consistency and nontender  Adnexa: normal adnexa in size, nontender and no masses  Rectal: normal rectal, no masses MUSCULOSKELETAL: Normal range of motion. No tenderness.  No cyanosis, clubbing, or edema.  2+ distal pulses. LYMPHATIC: No Axillary, Supraclavicular, or Inguinal Adenopathy.   Labs:   Lab Results  Component Value Date  WBC 7.0 04/28/2017   HGB 12.8 04/28/2017   HCT 39.2 04/28/2017   MCV 88.3 04/28/2017   PLT 244 04/28/2017    Lab Results  Component Value Date   CREATININE 0.65 04/10/2017   BUN 15 04/10/2017   NA 140 04/10/2017   K 3.7 04/10/2017   CL 96 04/10/2017   CO2 29 04/10/2017   Lab Results  Component Value Date   ALT 12 04/10/2017   AST 9 04/10/2017   ALKPHOS 54 04/10/2017   BILITOT 0.3 04/10/2017    Lab Results  Component Value Date   CHOL 202 (H) 04/10/2017   HDL 67 04/10/2017   LDLCALC 82 04/10/2017   LDLDIRECT 121.0 11/16/2014   TRIG 265 (H) 04/10/2017   CHOLHDL 3.0 04/10/2017   Lab Results  Component Value Date   TSH 0.54 12/26/2016     Assessment:   Annual gynecologic examination 52 y.o.  Vaginal spotting H/o endometrial ablation Obesity 1  MIldly elevated TG  Plan:  Pap: Pap Co Test.   Mammogram: Scheduled for later this month.  Stool Guaiac Testing:  Not Ordered. Patient scheduled for colonoscopy next month.  Labs: not done.  Has had recent bloodwork.   Contraception: None.  Routine preventative health maintenance measures emphasized: Exercise/Diet/Weight control, Alcohol/Substance use risks and Stress Management Vaginal spotting - patient perimenopausal, with h/o endometrial ablation last year.  Reiterated with patient that she may have scant occasional periods with the  endometrial ablation.  Advised to f/u if bleeding becomes heavier or prolonged.  Mildly elevated TG's, managed by PCP.  Return to [place] for annual exam.    [name], MD [facility]

## 2017-05-08 ENCOUNTER — Other Ambulatory Visit (HOSPITAL_COMMUNITY): Payer: Self-pay | Admitting: Psychiatry

## 2017-05-08 ENCOUNTER — Other Ambulatory Visit: Payer: Self-pay | Admitting: Internal Medicine

## 2017-05-08 ENCOUNTER — Inpatient Hospital Stay: Payer: Managed Care, Other (non HMO) | Attending: Hematology and Oncology

## 2017-05-08 VITALS — BP 118/75 | HR 77 | Resp 20

## 2017-05-08 DIAGNOSIS — D500 Iron deficiency anemia secondary to blood loss (chronic): Secondary | ICD-10-CM | POA: Insufficient documentation

## 2017-05-08 DIAGNOSIS — F331 Major depressive disorder, recurrent, moderate: Secondary | ICD-10-CM

## 2017-05-08 DIAGNOSIS — Z79899 Other long term (current) drug therapy: Secondary | ICD-10-CM | POA: Insufficient documentation

## 2017-05-08 MED ORDER — IRON SUCROSE 20 MG/ML IV SOLN
200.0000 mg | Freq: Once | INTRAVENOUS | Status: AC
  Administered 2017-05-08: 200 mg via INTRAVENOUS
  Filled 2017-05-08: qty 10

## 2017-05-08 MED ORDER — SODIUM CHLORIDE 0.9 % IV SOLN
INTRAVENOUS | Status: AC
  Administered 2017-05-08 – 2018-05-11 (×2): via INTRAVENOUS
  Filled 2017-05-08: qty 1000

## 2017-05-09 ENCOUNTER — Ambulatory Visit
Admission: RE | Admit: 2017-05-09 | Discharge: 2017-05-09 | Disposition: A | Discharge status: Home / Self Care | Payer: Managed Care, Other (non HMO) | Source: Ambulatory Visit | Attending: Obstetrics and Gynecology | Admitting: Obstetrics and Gynecology

## 2017-05-09 DIAGNOSIS — Z1231 Encounter for screening mammogram for malignant neoplasm of breast: Secondary | ICD-10-CM | POA: Diagnosis not present

## 2017-05-13 LAB — IGP, COBASHPV16/18
HPV 16: POSITIVE — AB
HPV 18: NEGATIVE
HPV OTHER HR TYPES: NEGATIVE
PAP SMEAR COMMENT: 0

## 2017-05-14 ENCOUNTER — Ambulatory Visit (INDEPENDENT_AMBULATORY_CARE_PROVIDER_SITE_OTHER): Payer: 59 | Admitting: Psychiatry

## 2017-05-14 ENCOUNTER — Encounter (HOSPITAL_COMMUNITY): Payer: Self-pay | Admitting: Psychiatry

## 2017-05-14 DIAGNOSIS — G894 Chronic pain syndrome: Secondary | ICD-10-CM | POA: Diagnosis not present

## 2017-05-14 DIAGNOSIS — Z62810 Personal history of physical and sexual abuse in childhood: Secondary | ICD-10-CM

## 2017-05-14 DIAGNOSIS — F331 Major depressive disorder, recurrent, moderate: Secondary | ICD-10-CM

## 2017-05-14 DIAGNOSIS — Z91411 Personal history of adult psychological abuse: Secondary | ICD-10-CM

## 2017-05-14 DIAGNOSIS — Z818 Family history of other mental and behavioral disorders: Secondary | ICD-10-CM

## 2017-05-14 MED ORDER — DULOXETINE HCL 60 MG PO CPEP: 60.0000 mg | ORAL_CAPSULE | Freq: Every day | ORAL | 0 refills | Status: DC
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
## 2017-05-15 ENCOUNTER — Telehealth: Payer: Self-pay

## 2017-05-15 NOTE — Telephone Encounter (Signed)
Called pt to see if she scheduled an appt for her colpo pt scheduled the colpo for June 18, 2017 at 10am

## 2017-05-19 ENCOUNTER — Other Ambulatory Visit: Payer: Self-pay | Admitting: Podiatry

## 2017-05-19 DIAGNOSIS — L03119 Cellulitis of unspecified part of limb: Secondary | ICD-10-CM

## 2017-05-19 DIAGNOSIS — L02619 Cutaneous abscess of unspecified foot: Secondary | ICD-10-CM

## 2017-05-23 ENCOUNTER — Ambulatory Visit: Payer: Managed Care, Other (non HMO) | Admitting: Certified Registered"

## 2017-05-23 ENCOUNTER — Ambulatory Visit
Admission: RE | Admit: 2017-05-23 | Discharge: 2017-05-23 | Disposition: A | Discharge status: Home / Self Care | Payer: Managed Care, Other (non HMO) | Source: Ambulatory Visit | Attending: Gastroenterology | Admitting: Gastroenterology

## 2017-05-23 ENCOUNTER — Encounter: Payer: Self-pay | Admitting: Anesthesiology

## 2017-05-23 ENCOUNTER — Encounter
Admission: RE | Disposition: A | Discharge status: Home / Self Care | Payer: Self-pay | Source: Ambulatory Visit | Attending: Gastroenterology

## 2017-05-23 DIAGNOSIS — Z9884 Bariatric surgery status: Secondary | ICD-10-CM | POA: Insufficient documentation

## 2017-05-23 DIAGNOSIS — Z79890 Hormone replacement therapy (postmenopausal): Secondary | ICD-10-CM | POA: Diagnosis not present

## 2017-05-23 DIAGNOSIS — Z96652 Presence of left artificial knee joint: Secondary | ICD-10-CM | POA: Insufficient documentation

## 2017-05-23 DIAGNOSIS — K219 Gastro-esophageal reflux disease without esophagitis: Secondary | ICD-10-CM | POA: Insufficient documentation

## 2017-05-23 DIAGNOSIS — D509 Iron deficiency anemia, unspecified: Secondary | ICD-10-CM | POA: Diagnosis not present

## 2017-05-23 DIAGNOSIS — E039 Hypothyroidism, unspecified: Secondary | ICD-10-CM | POA: Insufficient documentation

## 2017-05-23 DIAGNOSIS — Z85828 Personal history of other malignant neoplasm of skin: Secondary | ICD-10-CM | POA: Insufficient documentation

## 2017-05-23 DIAGNOSIS — Z79899 Other long term (current) drug therapy: Secondary | ICD-10-CM | POA: Insufficient documentation

## 2017-05-23 DIAGNOSIS — Z85820 Personal history of malignant melanoma of skin: Secondary | ICD-10-CM | POA: Insufficient documentation

## 2017-05-23 DIAGNOSIS — Z96642 Presence of left artificial hip joint: Secondary | ICD-10-CM | POA: Diagnosis not present

## 2017-05-23 DIAGNOSIS — Z1211 Encounter for screening for malignant neoplasm of colon: Secondary | ICD-10-CM | POA: Diagnosis present

## 2017-05-23 DIAGNOSIS — Z538 Procedure and treatment not carried out for other reasons: Secondary | ICD-10-CM | POA: Insufficient documentation

## 2017-05-23 DIAGNOSIS — I10 Essential (primary) hypertension: Secondary | ICD-10-CM | POA: Diagnosis not present

## 2017-05-23 SURGERY — COLONOSCOPY WITH PROPOFOL
Anesthesia: General

## 2017-05-23 MED ORDER — PROPOFOL 500 MG/50ML IV EMUL
INTRAVENOUS | Status: AC
  Filled 2017-05-23: qty 50

## 2017-05-23 MED ORDER — MIDAZOLAM HCL 2 MG/2ML IJ SOLN
INTRAMUSCULAR | Status: AC
  Filled 2017-05-23: qty 2

## 2017-05-23 NOTE — H&P (Signed)
Lori Antigua, MD [address], [place], [place], [place], [address] [address], [place], [place], [place], [address] Phone: [phone]  Fax: [phone]  Primary Care Physician:  Einar Pheasant, MD   Pre-Procedure History & Physical: HPI:  Lori Le is a 52 y.o. female is here for a colonoscopy and EGD.   Past Medical History:  Diagnosis Date  . Allergic rhinitis due to allergen   . Allergy   . Anemia   . Arthritis   . Cancer ([place])    skin ca  . Chronic idiopathic urticaria   . Complication of anesthesia    nausea, slow to wake up  . Depression   . GERD (gastroesophageal reflux disease)   . Gout   . Gout   . Hashimoto's thyroiditis   . Heart murmur   . History of eating disorder   . Hives   . Hyperlipidemia   . Hypertension   . Hypothyroidism   . Lower extremity edema   . Melanoma in situ ([place])    left shoulder  . Migraine   . Mitral valve disorder   . Motion sickness    all moving vehicles  . PCOS (polycystic ovarian syndrome)   . PONV (postoperative nausea and vomiting)   . Thyroid disease   . Ulcer   . Vertigo    last episode over 1 yr ago    Past Surgical History:  Procedure Laterality Date  . BREAST BIOPSY Right    neg- bx/clip  . CHOLECYSTECTOMY  2003  . DILITATION & CURRETTAGE/HYSTROSCOPY WITH NOVASURE ABLATION N/A 08/19/2016   Procedure: DILATATION & CURETTAGE/HYSTEROSCOPY WITH NOVASURE ABLATION;  Surgeon: [name], MD;  Location: [facility] ORS;  Service: Gynecology;  Laterality: N/A;  . ESOPHAGOGASTRODUODENOSCOPY (EGD) WITH PROPOFOL N/A 12/05/2015   Procedure: ESOPHAGOGASTRODUODENOSCOPY (EGD) WITH PROPOFOL;  Surgeon: Lollie Sails, MD;  Location: [facility] ENDOSCOPY;  Service: Endoscopy;  Laterality: N/A;  . ESOPHAGOGASTRODUODENOSCOPY (EGD) WITH PROPOFOL N/A 02/06/2016   Procedure: ESOPHAGOGASTRODUODENOSCOPY (EGD) WITH PROPOFOL;  Surgeon: Lollie Sails, MD;  Location: [facility] END[facility]COPY;  Service: Endoscopy;  Laterality: N/A;  .  FOOT SURGERY  1994  . GASTRIC BYPASS  2011  . JOINT REPLACEMENT Left 09/27/2014   HIP  . JOINT REPLACEMENT Left 12/01/2014   KNEE  . KNEE ARTHROSCOPY Right   . KNEE ARTHROSCOPY Left 03/23/2015   Procedure: ARTHROSCOPY KNEE, partial synovectomy;  Surgeon: Hessie Knows, MD;  Location: [facility] ORS;  Service: Orthopedics;  Laterality: Left;  . LAPAROSCOPIC GASTRIC BANDING  2010  . ROTATOR CUFF REPAIR Bilateral    right shoulder 07-26-2016  . SHOULDER ARTHROSCOPY Left 06/20/2015   Procedure: ARTHROSCOPY SHOULDER, REPAIR OF MASSIVE ROTATOR CUFF TEAR, TENODESIS, DECOMPRESSION DEBRIDEMENT;  Surgeon: Corky Mull, MD;  Location: [facility] ORS;  Service: Orthopedics;  Laterality: Left;  . TOTAL HIP ARTHROPLASTY Left 09/27/2014   Procedure: TOTAL HIP ARTHROPLASTY ANTERIOR APPROACH;  Surgeon: Hessie Knows, MD;  Location: [facility] ORS;  Service: Orthopedics;  Laterality: Left;  . TOTAL KNEE ARTHROPLASTY Left 12/01/2014   Procedure: TOTAL KNEE ARTHROPLASTY;  Surgeon: Hessie Knows, MD;  Location: [facility] ORS;  Service: Orthopedics;  Laterality: Left;    Prior to Admission medications   Medication Sig Start Date End Date Taking? Authorizing Provider  acetaminophen (TYLENOL) 650 MG CR tablet Take 1,300 mg by mouth 2 (two) times daily.    [provider]  DULoxetine (CYMBALTA) 60 MG capsule Take 1 capsule (60 mg total) by mouth daily. 05/14/17   Arfeen, Arlyce Harman, MD  furosemide (  LASIX) 40 MG tablet TAKE 1 TABLET (40 MG TOTAL) BY MOUTH DAILY. 04/24/17   Einar Pheasant, MD  levocetirizine (XYZAL) 5 MG tablet every evening. 04/23/17   [provider]  levothyroxine (SYNTHROID, LEVOTHROID) 137 MCG tablet TAKE ONE TABLET BY MOUTH ONCE DAILY BEFORE BREAKFAST Patient taking differently: Take 137 mcg by mouth daily before breakfast. DAILY BEFORE BREAKFAST 09/11/15   Einar Pheasant, MD  montelukast (SINGULAIR) 10 MG tablet Take 10 mg by mouth daily. 04/23/17   [provider]  mupirocin ointment (BACTROBAN) 2 %  Place 1 application into the nose 2 (two) times daily. Patient not taking: Reported on 05/14/2017 03/21/17   Einar Pheasant, MD  pantoprazole (PROTONIX) 40 MG tablet TAKE 1 TABLET BY MOUTH EVERY DAY. *MAINTENANCE REFILLS EXCEEDED* 05/09/17   Einar Pheasant, MD  potassium chloride (KLOR-CON 10) 10 MEQ tablet TAKE ONE TABLET TWICE FOR THREE DAYS AND THEN DAILY. *MAINT REFILLS EXCEEDED* 02/04/17   Einar Pheasant, MD  triamterene-hydrochlorothiazide (MAXZIDE-25) 37.5-25 MG tablet Take 1 tablet by mouth daily. 11/13/16   Einar Pheasant, MD    Allergies as of 05/07/2017 - Review Complete 05/06/2017  Allergen Reaction Noted  . Etodolac Anaphylaxis 02/05/2017  . Influenza vaccines Hives 12/09/2013  . Lactose Nausea And Vomiting 04/25/2015  . Lactose intolerance (gi) Nausea And Vomiting 09/27/2014  . Bacitracin-neomycin-polymyxin Rash 04/25/2015  . Neomycin-bacitracin zn-polymyx Rash   . Neosporin + pain relief max st [neomy-bacit-polymyx-pramoxine] Rash 07/31/2012    Family History  Problem Relation Age of Onset  . Arthritis Mother   . Hyperlipidemia Mother   . Hypertension Mother   . Diabetes Mother   . Arthritis Father   . Hyperlipidemia Father   . Hypertension Father   . Mental illness Father   . Diabetes Father   . Arthritis Maternal Grandmother   . Cancer Maternal Grandmother        breast cancer  . Hyperlipidemia Maternal Grandmother   . Hypertension Maternal Grandmother   . Breast cancer Maternal Grandmother   . Arthritis Maternal Grandfather   . Hyperlipidemia Maternal Grandfather   . Hypertension Maternal Grandfather   . Heart disease Maternal Grandfather        heart attack  . Breast cancer Paternal Grandmother     Social History   Socioeconomic History  . Marital status: Divorced    Spouse name: Not on file  . Number of children: Not on file  . Years of education: Not on file  . Highest education level: Not on file  Social Needs  . Financial resource strain: Not  on file  . Food insecurity - worry: Not on file  . Food insecurity - inability: Not on file  . Transportation needs - medical: Not on file  . Transportation needs - non-medical: Not on file  Occupational History  . Not on file  Tobacco Use  . Smoking status: Never Smoker  . Smokeless tobacco: Never Used  Substance and Sexual Activity  . Alcohol use: Yes    Alcohol/week: 0.0 - 0.6 oz    Comment: occ.  . Drug use: No  . Sexual activity: Yes    Birth control/protection: Surgical    Comment: Ablation   Other Topics Concern  . Not on file  Social History Narrative  . Not on file    Review of Systems: See HPI, otherwise negative ROS  Physical Exam: There were no vitals taken for this visit. General:   Alert,  pleasant and cooperative in NAD Head:  Normocephalic and atraumatic.  Neck:  Supple; no masses or thyromegaly. Lungs:  Clear throughout to auscultation, normal respiratory effort.    Heart:  +S1, +S2, Regular rate and rhythm, No edema. Abdomen:  Soft, nontender and nondistended. Normal bowel sounds, without guarding, and without rebound.   Neurologic:  Alert and  oriented x4;  grossly normal neurologically.  Impression/Plan: Lori Le is here for a colonoscopy to be performed for average risk screening and EGD for iron deficiency anemia  Risks, benefits, limitations, and alternatives regarding the procedures have been reviewed with the patient.  Questions have been answered.  All parties agreeable.   [name], MD  05/23/2017, 9:29 AM

## 2017-05-23 NOTE — H&P (Signed)
Patient came to the endoscopy unit today for her procedures.  Patient drank her entire prep completely.  However, she reported that since taking the prep she had only 2 bowel movements at home and they both consisted of stool.  She had another bowel movement while she was in the endoscopy unit this morning, and she states it consisted of hard stool.  Patient reports chronic constipation as well.  I had an extensive discussion with her, about the high likelihood of significant amount of stool still being present in her colon, and as not being able to do a good screening exam today.  The options of proceeding with the procedure, and evaluating her colon to see how much stool is present, and avoiding the procedure if too much stool is present, versus rescheduling the procedure with a 2-day prep was discussed in detail with her.  She chooses to reschedule the procedure at this time.    It is better to also reschedule the upper endoscopy to be done with a colonoscopy, to allow for sedation only one time.  Patient is agreeable with this as well.  No IV lines were placed today, no sedation was given today.  I will have my clinical staff call her to reschedule her procedures.  I have asked her to start taking MiraLAX daily for 1-2 weeks prior to the procedure as well.

## 2017-05-23 NOTE — OR Nursing (Signed)
Patient stated she was having solid stool with bowel movement. Dr Murlean Iba in to speak with patient. Procedure cancelled and to reschedule.

## 2017-05-23 NOTE — Anesthesia Preprocedure Evaluation (Signed)
Anesthesia Evaluation  Patient identified by MRN, date of birth, ID band Patient awake    Reviewed: Allergy & Precautions, NPO status , Patient's Chart, lab work & pertinent test results, reviewed documented beta blocker date and time   History of Anesthesia Complications (+) PONV and history of anesthetic complications  Airway Mallampati: III  TM Distance: >3 FB     Dental  (+) Chipped   Pulmonary           Cardiovascular hypertension, Pt. on medications + Valvular Problems/Murmurs      Neuro/Psych  Headaches, PSYCHIATRIC DISORDERS Depression    GI/Hepatic GERD  ,  Endo/Other  Hypothyroidism   Renal/GU      Musculoskeletal  (+) Arthritis ,   Abdominal   Peds  Hematology  (+) anemia ,   Anesthesia Other Findings Gastric bypass. Obese. Has motion sickness - scopalamine patch.  Reproductive/Obstetrics                             Anesthesia Physical Anesthesia Plan  ASA: III  Anesthesia Plan: General   Post-op Pain Management:    Induction: Intravenous  PONV Risk Score and Plan:   Airway Management Planned:   Additional Equipment:   Intra-op Plan:   Post-operative Plan:   Informed Consent: I have reviewed the patients History and Physical, chart, labs and discussed the procedure including the risks, benefits and alternatives for the proposed anesthesia with the patient or authorized representative who has indicated his/her understanding and acceptance.     Plan Discussed with: CRNA  Anesthesia Plan Comments:         Anesthesia Quick Evaluation

## 2017-05-24 ENCOUNTER — Ambulatory Visit
Admission: RE | Admit: 2017-05-24 | Discharge: 2017-05-24 | Disposition: A | Discharge status: Home / Self Care | Payer: Managed Care, Other (non HMO) | Source: Ambulatory Visit | Attending: Podiatry | Admitting: Podiatry

## 2017-05-24 ENCOUNTER — Emergency Department
Admission: EM | Admit: 2017-05-24 | Discharge: 2017-05-24 | Disposition: A | Discharge status: Home / Self Care | Payer: Managed Care, Other (non HMO) | Attending: Emergency Medicine | Admitting: Emergency Medicine

## 2017-05-24 ENCOUNTER — Other Ambulatory Visit: Payer: Self-pay

## 2017-05-24 ENCOUNTER — Encounter: Payer: Self-pay | Admitting: Emergency Medicine

## 2017-05-24 DIAGNOSIS — R600 Localized edema: Secondary | ICD-10-CM | POA: Insufficient documentation

## 2017-05-24 DIAGNOSIS — Z79899 Other long term (current) drug therapy: Secondary | ICD-10-CM | POA: Diagnosis not present

## 2017-05-24 DIAGNOSIS — L02619 Cutaneous abscess of unspecified foot: Secondary | ICD-10-CM | POA: Insufficient documentation

## 2017-05-24 DIAGNOSIS — I10 Essential (primary) hypertension: Secondary | ICD-10-CM | POA: Insufficient documentation

## 2017-05-24 DIAGNOSIS — Z96652 Presence of left artificial knee joint: Secondary | ICD-10-CM | POA: Diagnosis not present

## 2017-05-24 DIAGNOSIS — L03119 Cellulitis of unspecified part of limb: Secondary | ICD-10-CM

## 2017-05-24 DIAGNOSIS — T7840XA Allergy, unspecified, initial encounter: Secondary | ICD-10-CM | POA: Diagnosis not present

## 2017-05-24 DIAGNOSIS — Z01818 Encounter for other preprocedural examination: Secondary | ICD-10-CM | POA: Insufficient documentation

## 2017-05-24 DIAGNOSIS — R21 Rash and other nonspecific skin eruption: Secondary | ICD-10-CM | POA: Insufficient documentation

## 2017-05-24 DIAGNOSIS — Z85828 Personal history of other malignant neoplasm of skin: Secondary | ICD-10-CM | POA: Insufficient documentation

## 2017-05-24 DIAGNOSIS — Z96642 Presence of left artificial hip joint: Secondary | ICD-10-CM | POA: Diagnosis not present

## 2017-05-24 DIAGNOSIS — L97519 Non-pressure chronic ulcer of other part of right foot with unspecified severity: Secondary | ICD-10-CM

## 2017-05-24 DIAGNOSIS — E039 Hypothyroidism, unspecified: Secondary | ICD-10-CM | POA: Diagnosis not present

## 2017-05-24 MED ORDER — FAMOTIDINE IN NACL 20-0.9 MG/50ML-% IV SOLN
20.0000 mg | Freq: Once | INTRAVENOUS | Status: AC
  Administered 2017-05-24: 20 mg via INTRAVENOUS

## 2017-05-24 MED ORDER — DEXAMETHASONE SODIUM PHOSPHATE 10 MG/ML IJ SOLN
INTRAMUSCULAR | Status: AC
  Filled 2017-05-24: qty 1

## 2017-05-24 MED ORDER — EPINEPHRINE 0.3 MG/0.3ML IJ SOAJ: 0.3000 mg | Freq: Once | INTRAMUSCULAR | 0 refills | Status: AC

## 2017-05-24 MED ORDER — GADOBENATE DIMEGLUMINE 529 MG/ML IV SOLN
20.0000 mL | Freq: Once | INTRAVENOUS | Status: AC | PRN
  Administered 2017-05-24: 20 mL via INTRAVENOUS

## 2017-05-24 MED ORDER — FAMOTIDINE IN NACL 20-0.9 MG/50ML-% IV SOLN
INTRAVENOUS | Status: AC
  Filled 2017-05-24: qty 50

## 2017-05-24 MED ORDER — PREDNISONE 20 MG PO TABS: 40.0000 mg | ORAL_TABLET | Freq: Every day | ORAL | 0 refills | Status: AC

## 2017-05-24 MED ORDER — DIPHENHYDRAMINE HCL 50 MG/ML IJ SOLN
INTRAMUSCULAR | Status: AC
  Filled 2017-05-24: qty 1

## 2017-05-24 MED ORDER — DIPHENHYDRAMINE HCL 50 MG/ML IJ SOLN
50.0000 mg | Freq: Once | INTRAMUSCULAR | Status: AC
  Administered 2017-05-24: 50 mg via INTRAVENOUS

## 2017-05-24 MED ORDER — DEXAMETHASONE SODIUM PHOSPHATE 10 MG/ML IJ SOLN
10.0000 mg | Freq: Once | INTRAMUSCULAR | Status: AC
  Administered 2017-05-24: 10 mg via INTRAVENOUS

## 2017-05-24 NOTE — ED Notes (Signed)
FN: pt brought over from MRI with allergic reaction from the dye. Pt c/o itching, no resp distress noted.

## 2017-05-24 NOTE — ED Provider Notes (Signed)
[facility] Emergency Department Provider Note  ___________________________________________   First MD Initiated Contact with Patient 05/24/17 [phone]     (approximate)  I have reviewed the triage vital signs and the nursing notes.   HISTORY  Chief Complaint Allergic Reaction   HPI Lori Le is a 52 y.o. female with a history of multiple medication allergies who is presenting to the emergency department after reacting to contrast from an MRI earlier this morning.  She says that she was having an MRI done for a lesion on the bottom of her right foot.  After the contrast was pushed she said within a matter of minutes she started feeling tingling around her mouth and developing an itchy, red rash to her trunk.  She says the rash and itching also was extending to her bilateral upper extremities.  She denied any difficulty swallowing or feeling of swelling to her throat.  Also denies any difficulty breathing.   Past Medical History:  Diagnosis Date  . Allergic rhinitis due to allergen   . Allergy   . Anemia   . Arthritis   . Cancer ([place])    skin ca  . Chronic idiopathic urticaria   . Complication of anesthesia    nausea, slow to wake up  . Depression   . GERD (gastroesophageal reflux disease)   . Gout   . Gout   . Hashimoto's thyroiditis   . Heart murmur   . History of eating disorder   . Hives   . Hyperlipidemia   . Hypertension   . Hypothyroidism   . Lower extremity edema   . Melanoma in situ ([place])    left shoulder  . Migraine   . Mitral valve disorder   . Motion sickness    all moving vehicles  . PCOS (polycystic ovarian syndrome)   . PONV (postoperative nausea and vomiting)   . Thyroid disease   . Ulcer   . Vertigo    last episode over 1 yr ago    Patient Active Problem List   Diagnosis Date Noted  . Hyperglycemia 03/23/2017  . Pharyngitis due to Streptococcus species 12/18/2016  . Rotator cuff syndrome 04/24/2015  . Arthritis  of knee, degenerative 04/24/2015  . Degenerative arthritis of hip 04/24/2015  . B12 deficiency 01/06/2015  . Primary osteoarthritis of knee 12/01/2014  . Anemia 11/22/2014  . Primary localized osteoarthritis of left hip 09/27/2014  . BMI 35.0-35.9,adult 03/20/2014  . Gout 03/20/2014  . Excessive and frequent menstruation 08/10/2013  . Derangement of posterior horn of medial meniscus 08/10/2013  . Mitral valve disease 08/10/2013  . Edema 12/30/2012  . Essential hypertension, benign 08/02/2012  . Melanoma in situ ([place]) 08/02/2012  . Depression 08/02/2012  . Environmental allergies 08/02/2012  . Hypercholesterolemia 08/02/2012  . Migraines 08/02/2012  . Hypothyroidism 08/02/2012  . PCOS (polycystic ovarian syndrome) 08/02/2012  . GERD (gastroesophageal reflux disease) 08/02/2012  . H/O gastric bypass 08/02/2012  . Food allergy 07/19/2011    Past Surgical History:  Procedure Laterality Date  . BREAST BIOPSY Right    neg- bx/clip  . CHOLECYSTECTOMY  2003  . DILITATION & CURRETTAGE/HYSTROSCOPY WITH NOVASURE ABLATION N/A 08/19/2016   Procedure: DILATATION & CURETTAGE/HYSTEROSCOPY WITH NOVASURE ABLATION;  Surgeon: [name], MD;  Location: [facility] ORS;  Service: Gynecology;  Laterality: N/A;  . ESOPHAGOGASTRODUODENOSCOPY (EGD) WITH PROPOFOL N/A 12/05/2015   Procedure: ESOPHAGOGASTRODUODENOSCOPY (EGD) WITH PROPOFOL;  Surgeon: Lollie Sails, MD;  Location: [facility] ENDOSCOPY;  Service: Endoscopy;  Laterality: N/A;  .  ESOPHAGOGASTRODUODENOSCOPY (EGD) WITH PROPOFOL N/A 02/06/2016   Procedure: ESOPHAGOGASTRODUODENOSCOPY (EGD) WITH PROPOFOL;  Surgeon: Lollie Sails, MD;  Location: [facility] ENDOSCOPY;  Service: Endoscopy;  Laterality: N/A;  . FOOT SURGERY  1994  . GASTRIC BYPASS  2011  . JOINT REPLACEMENT Left 09/27/2014   HIP  . JOINT REPLACEMENT Left 12/01/2014   KNEE  . KNEE ARTHROSCOPY Right   . KNEE ARTHROSCOPY Left 03/23/2015   Procedure: ARTHROSCOPY KNEE, partial synovectomy;   Surgeon: Hessie Knows, MD;  Location: [facility] ORS;  Service: Orthopedics;  Laterality: Left;  . LAPAROSCOPIC GASTRIC BANDING  2010  . ROTATOR CUFF REPAIR Bilateral    right shoulder 07-26-2016  . SHOULDER ARTHROSCOPY Left 06/20/2015   Procedure: ARTHROSCOPY SHOULDER, REPAIR OF MASSIVE ROTATOR CUFF TEAR, TENODESIS, DECOMPRESSION DEBRIDEMENT;  Surgeon: Corky Mull, MD;  Location: [facility] ORS;  Service: Orthopedics;  Laterality: Left;  . TOTAL HIP ARTHROPLASTY Left 09/27/2014   Procedure: TOTAL HIP ARTHROPLASTY ANTERIOR APPROACH;  Surgeon: Hessie Knows, MD;  Location: [facility] ORS;  Service: Orthopedics;  Laterality: Left;  . TOTAL KNEE ARTHROPLASTY Left 12/01/2014   Procedure: TOTAL KNEE ARTHROPLASTY;  Surgeon: Hessie Knows, MD;  Location: [facility] ORS;  Service: Orthopedics;  Laterality: Left;    Prior to Admission medications   Medication Sig Start Date End Date Taking? Authorizing Provider  acetaminophen (TYLENOL) 650 MG CR tablet Take 1,300 mg by mouth 2 (two) times daily.    [provider]  DULoxetine (CYMBALTA) 60 MG capsule Take 1 capsule (60 mg total) by mouth daily. 05/14/17   Arfeen, Arlyce Harman, MD  furosemide (LASIX) 40 MG tablet TAKE 1 TABLET (40 MG TOTAL) BY MOUTH DAILY. 04/24/17   Einar Pheasant, MD  levocetirizine (XYZAL) 5 MG tablet every evening. 04/23/17   [provider]  levothyroxine (SYNTHROID, LEVOTHROID) 137 MCG tablet TAKE ONE TABLET BY MOUTH ONCE DAILY BEFORE BREAKFAST Patient taking differently: Take 137 mcg by mouth daily before breakfast. DAILY BEFORE BREAKFAST 09/11/15   Einar Pheasant, MD  montelukast (SINGULAIR) 10 MG tablet Take 10 mg by mouth daily. 04/23/17   [provider]  mupirocin ointment (BACTROBAN) 2 % Place 1 application into the nose 2 (two) times daily. Patient not taking: Reported on 05/14/2017 03/21/17   Einar Pheasant, MD  pantoprazole (PROTONIX) 40 MG tablet TAKE 1 TABLET BY MOUTH EVERY DAY. *MAINTENANCE REFILLS EXCEEDED* 05/09/17   Einar Pheasant, MD  potassium chloride (KLOR-CON 10) 10 MEQ tablet TAKE ONE TABLET TWICE FOR THREE DAYS AND THEN DAILY. *MAINT REFILLS EXCEEDED* 02/04/17   Einar Pheasant, MD  triamterene-hydrochlorothiazide (MAXZIDE-25) 37.5-25 MG tablet Take 1 tablet by mouth daily. 11/13/16   Einar Pheasant, MD    Allergies Etodolac; Influenza vaccines; Lactose; Lactose intolerance (gi); Bacitracin-neomycin-polymyxin; Contrast media [iodinated diagnostic agents]; Neomycin-bacitracin zn-polymyx; and Neosporin + pain relief max st [neomy-bacit-polymyx-pramoxine]  Family History  Problem Relation Age of Onset  . Arthritis Mother   . Hyperlipidemia Mother   . Hypertension Mother   . Diabetes Mother   . Arthritis Father   . Hyperlipidemia Father   . Hypertension Father   . Mental illness Father   . Diabetes Father   . Arthritis Maternal Grandmother   . Cancer Maternal Grandmother        breast cancer  . Hyperlipidemia Maternal Grandmother   . Hypertension Maternal Grandmother   . Breast cancer Maternal Grandmother   . Arthritis Maternal Grandfather   . Hyperlipidemia Maternal Grandfather   . Hypertension Maternal Grandfather   . Heart disease Maternal Grandfather  heart attack  . Breast cancer Paternal Grandmother     Social History Social History   Tobacco Use  . Smoking status: Never Smoker  . Smokeless tobacco: Never Used  Substance Use Topics  . Alcohol use: Yes    Alcohol/week: 0.0 - 0.6 oz    Comment: occ.  . Drug use: No    Review of Systems  Constitutional: No fever/chills Eyes: No visual changes. ENT: No sore throat. Cardiovascular: Denies chest pain. Respiratory: Denies shortness of breath. Gastrointestinal: No abdominal pain.  No nausea, no vomiting.  No diarrhea.  No constipation. Genitourinary: Negative for dysuria. Musculoskeletal: Negative for back pain. Skin: As above Neurological: Negative for headaches, focal weakness or  numbness.   ____________________________________________   PHYSICAL EXAM:  VITAL SIGNS: ED Triage Vitals [05/24/17 0951]  Enc Vitals Group     BP (!) 146/65     Pulse Rate 85     Resp 20     Temp 97.9 F (36.6 C)     Temp Source Oral     SpO2 100 %     Weight 240 lb (108.9 kg)     Height 5\' 7"  (1.702 m)     Head Circumference      Peak Flow      Pain Score      Pain Loc      Pain Edu?      Excl. in [place]?     Constitutional: Alert and oriented. Well appearing and in no acute distress. Eyes: Conjunctivae are normal.  Head: Atraumatic. Nose: No congestion/rhinnorhea. Mouth/Throat: Mucous membranes are moist.  No swelling to the lips, tongue nor any structures in the posterior pharynx.  The patient is speaking with a normal voice.  Controlling her secretions. Neck: No stridor.   Cardiovascular: Normal rate, regular rhythm. Grossly normal heart sounds.  Respiratory: Normal respiratory effort.  No retractions. Lungs CTAB. Gastrointestinal: Soft and nontender. No distention. Musculoskeletal: No lower extremity tenderness nor edema.  No joint effusions. Neurologic:  Normal speech and language. No gross focal neurologic deficits are appreciated. Skin: Mild erythema to the anterior and superior aspect of the chest without any distinct urticaria.  The patient says that this redness has improved since receiving medications.  Anterior, plantar surface of the right foot with a 2 cm ulceration that is only about a millimeter deep and is not erythematous.  There is no exudate.  No swelling or edema of the foot.  Psychiatric: Mood and affect are normal. Speech and behavior are normal.  ____________________________________________   LABS (all labs ordered are listed, but only abnormal results are displayed)  Labs Reviewed - No data to  display ____________________________________________  EKG   ____________________________________________  RADIOLOGY   ____________________________________________   PROCEDURES  Procedure(s) performed:   Procedures  Critical Care performed:   ____________________________________________   INITIAL IMPRESSION / ASSESSMENT AND PLAN / ED COURSE  Pertinent labs & imaging results that were available during my care of the patient were reviewed by me and considered in my medical decision making (see chart for details).  DDX: Allergic reaction, contrast allergy, anaphylaxis, urticaria, rash As part of my medical decision making, I reviewed the following data within the [place] chart reviewed  ----------------------------------------- 12:03 PM on 05/24/2017 -----------------------------------------  Patient at this time remains asymptomatic.  She will be discharged with an EpiPen as well as a week's worth of steroids.  She will follow-up with Dr. Elvina Mattes this Monday, her podiatrist. ____________________________________________   FINAL  CLINICAL IMPRESSION(S) / ED DIAGNOSES  Final diagnoses:  None      NEW MEDICATIONS STARTED DURING THIS VISIT:  New Prescriptions   No medications on file     Note:  This document was prepared using Dragon voice recognition software and may include unintentional dictation errors.     Orbie Pyo, MD 05/24/17 [phone]

## 2017-05-24 NOTE — ED Triage Notes (Signed)
Itching all over since injected with dye at MRI. No resp distress. Splotchy redness noted.

## 2017-05-24 NOTE — ED Notes (Signed)
Pt given ginger ale.

## 2017-05-29 ENCOUNTER — Inpatient Hospital Stay: Payer: Managed Care, Other (non HMO)

## 2017-06-06 ENCOUNTER — Encounter: Payer: Self-pay | Admitting: Gastroenterology

## 2017-06-06 ENCOUNTER — Other Ambulatory Visit: Payer: Self-pay

## 2017-06-06 DIAGNOSIS — Z1211 Encounter for screening for malignant neoplasm of colon: Secondary | ICD-10-CM

## 2017-06-06 DIAGNOSIS — D509 Iron deficiency anemia, unspecified: Secondary | ICD-10-CM

## 2017-06-06 DIAGNOSIS — Z8711 Personal history of peptic ulcer disease: Secondary | ICD-10-CM

## 2017-06-06 DIAGNOSIS — K219 Gastro-esophageal reflux disease without esophagitis: Secondary | ICD-10-CM

## 2017-06-06 DIAGNOSIS — Z8719 Personal history of other diseases of the digestive system: Secondary | ICD-10-CM

## 2017-06-06 MED ORDER — BISACODYL 5 MG PO TBEC: 10.0000 mg | DELAYED_RELEASE_TABLET | Freq: Once | ORAL | 0 refills | Status: AC

## 2017-06-06 MED ORDER — POLYETHYLENE GLYCOL 3350 17 GM/SCOOP PO POWD: 238.0000 g | Freq: Every day | ORAL | 0 refills | Status: AC

## 2017-06-06 NOTE — Telephone Encounter (Signed)
[name], MD sent to Lori Lee, LPN        Patient's procedure could not be done today as she had solid stool output despite completing her prep. [name], 1. Please call her and reschedule her for the EGD and colonoscopy. Please order a 2 day prep for the patient (2 day of clear liquids and she will need 2 day of prep orders instead of 1 day).  2. Please also ask her to start taking miralax daily today  3. Please ask her to monitor her stool output and if she is not having 1-2 Bowel movements daily 1 week prior to the procedure, ask her to start taking miralax twice daily 1 week prior to the procedure.   [name], MD  Physician  Gastroenterology  H&P  Signed  Date of Service:  05/23/2017 9:53 AM          Signed           Patient came to the endoscopy unit today for her procedures.  Patient drank her entire prep completely.  However, she reported that since taking the prep she had only 2 bowel movements at home and they both consisted of stool.  She had another bowel movement while she was in the endoscopy unit this morning, and she states it consisted of hard stool.  Patient reports chronic constipation as well.  I had an extensive discussion with her, about the high likelihood of significant amount of stool still being present in her colon, and as not being able to do a good screening exam today.  The options of proceeding with the procedure, and evaluating her colon to see how much stool is present, and avoiding the procedure if too much stool is present, versus rescheduling the procedure with a 2-day prep was discussed in detail with her.  She chooses to reschedule the procedure at this time.    It is better to also reschedule the upper endoscopy to be done with a colonoscopy, to allow for sedation only one time.  Patient is agreeable with this as well.  No IV lines were placed today, no sedation was given today.  I will have my clinical staff  call her to reschedule her procedures.  I have asked her to start taking MiraLAX daily for 1-2 weeks prior to the procedure as well.          Electronically signed by [name], MD at 05/23/2017 9:57 AM   I have contacted pt and she was given instructions for her 2 day prep and was rescheduled to 07/11/17 at [facility]. Prep information mailed.

## 2017-06-06 NOTE — Telephone Encounter (Signed)
PT LEFT VM SHE IS RETURNING DEBBIES CALL SHE CAN BE REACHED AT # [phone]

## 2017-06-08 ENCOUNTER — Other Ambulatory Visit: Payer: Self-pay | Admitting: Internal Medicine

## 2017-06-11 ENCOUNTER — Other Ambulatory Visit: Payer: Self-pay | Admitting: Internal Medicine

## 2017-06-13 NOTE — Telephone Encounter (Signed)
Error

## 2017-06-16 ENCOUNTER — Telehealth: Payer: Self-pay | Admitting: Gastroenterology

## 2017-06-16 NOTE — Telephone Encounter (Signed)
Patient called and having with Miralax causing gas, pain, constipated. What else can she take?

## 2017-06-17 NOTE — Telephone Encounter (Signed)
Pt called back for Lori Le pt is aware Jackelyn Poling will call her back this afternoon

## 2017-06-17 NOTE — Telephone Encounter (Signed)
I spoke with Dr. Bonna Gains after speaking with pt. She states that her stool is too soft and will not come out but a little. Having gas and pain. Taking Miralax daily and some days she may double dose or take it 2xd. Has also used dulcolax supp. With some relief, not much.  Dr. Bonna Gains recommends: Metamucil 2x daily Mush like mashed potatoes, pudding or applesauce is okay, as long as there is stool. If no BM after 2 days take Ducolax supp. If bloating is the problem then she may take docusate 2x daily.  Pt will contact office on Friday and let us know how she is doing.  Pt states she has Benefiber at home and can she use this instead. Yes, but if bloating continues she may want to just take the docusate instead of Miralax, Metamucil, or Benefiber.

## 2017-06-18 ENCOUNTER — Encounter: Payer: Self-pay | Admitting: Obstetrics and Gynecology

## 2017-06-18 ENCOUNTER — Ambulatory Visit (INDEPENDENT_AMBULATORY_CARE_PROVIDER_SITE_OTHER): Payer: Managed Care, Other (non HMO) | Admitting: Obstetrics and Gynecology

## 2017-06-18 VITALS — BP 127/75 | HR 66 | Ht 67.0 in | Wt 248.1 lb

## 2017-06-18 DIAGNOSIS — R87810 Cervical high risk human papillomavirus (HPV) DNA test positive: Secondary | ICD-10-CM | POA: Diagnosis not present

## 2017-06-18 DIAGNOSIS — R87610 Atypical squamous cells of undetermined significance on cytologic smear of cervix (ASC-US): Secondary | ICD-10-CM

## 2017-06-18 NOTE — Patient Instructions (Addendum)
Colposcopy, Care After  This sheet gives you information about how to care for yourself after your procedure. Your doctor may also give you more specific instructions. If you have problems or questions, contact your doctor.  What can I expect after the procedure?  If you did not have a tissue sample removed (did not have a biopsy), you may only have some spotting for a few days. You can go back to your normal activities.  If you had a tissue sample removed, it is common to have:  · Soreness and pain. This may last for a few days.  · Light-headedness.  · Mild bleeding from your vagina or dark-colored, grainy discharge from your vagina. This may last for a few days. You may need to wear a sanitary pad.  · Spotting for at least 48 hours after the procedure.    Follow these instructions at home:  · Take over-the-counter and prescription medicines only as told by your doctor. Ask your doctor what medicines you can start taking again. This is very important if you take blood-thinning medicine.  · Do not drive or use heavy machinery while taking prescription pain medicine.  · For 3 days, or as long as your doctor tells you, avoid:  ? Douching.  ? Using tampons.  ? Having sex.  · If you use birth control (contraception), keep using it.  · Limit activity for the first day after the procedure. Ask your doctor what activities are safe for you.  · It is up to you to get the results of your procedure. Ask your doctor when your results will be ready.  · Keep all follow-up visits as told by your doctor. This is important.  Contact a doctor if:  · You get a skin rash.  Get help right away if:  · You are bleeding a lot from your vagina. It is a lot of bleeding if you are using more than one pad an hour for 2 hours in a row.  · You have clumps of blood (blood clots) coming from your vagina.  · You have a fever.  · You have chills  · You have pain in your lower belly (pelvic area).  · You have signs of infection, such as vaginal  discharge that is:  ? Different than usual.  ? Yellow.  ? Bad-smelling.  · You have very pain or cramps in your lower belly that do not get better with medicine.  · You feel light-headed.  · You feel dizzy.  · You pass out (faint).  Summary  · If you did not have a tissue sample removed (did not have a biopsy), you may only have some spotting for a few days. You can go back to your normal activities.  · If you had a tissue sample removed, it is common to have mild pain and spotting for 48 hours.  · For 3 days, or as long as your doctor tells you, avoid douching, using tampons and having sex.  · Get help right away if you have bleeding, very bad pain, or signs of infection.  This information is not intended to replace advice given to you by your health care provider. Make sure you discuss any questions you have with your health care provider.  Document Released: 09/04/2007 Document Revised: 12/06/2015 Document Reviewed: 12/06/2015  Elsevier Interactive Patient Education © 2018 Elsevier Inc.

## 2017-06-18 NOTE — Progress Notes (Signed)
Pt is doing well.

## 2017-06-18 NOTE — Progress Notes (Signed)
     GYNECOLOGY CLINIC COLPOSCOPY PROCEDURE NOTE  52 y.o. G0P0000 here for colposcopy for ASCUS with POSITIVE high risk HPV pap smear on 05/06/2017. Discussed role for HPV in cervical dysplasia, need for surveillance.  Patient given informed consent, signed copy in the chart, time out was performed.  Placed in lithotomy position. Cervix viewed with speculum and colposcope after application of acetic acid.   Colposcopy adequate? Yes  no mosaicism, no punctation, no abnormal vasculature and acetowhite lesion(s) noted at 11-12 o'clock and 1-4 o'clock; corresponding biopsies obtained at 11 and 2 o'clock.  ECC specimen obtained. All specimens were labeled and sent to pathology.   Patient was given post procedure instructions.  Will follow up pathology and manage accordingly; patient will be contacted with results and recommendations.  Routine preventative health maintenance measures emphasized.    [name], MD [facility]

## 2017-06-20 LAB — PATHOLOGY

## 2017-06-24 ENCOUNTER — Telehealth: Payer: Self-pay

## 2017-06-24 NOTE — Telephone Encounter (Signed)
Pt calls and states she stopped taking meds for bowels last week-all medications to do with bowels. She states nothing is helping and she is miserable.  I suggested to take Dulcolax 10mg  for couple of days and enema tonight. To contact office in a few days to let us know how she is doing.

## 2017-06-24 NOTE — Telephone Encounter (Addendum)
She was given sure prep. Per your note on 2/22 she is to have the 2 day prep.the next time.

## 2017-06-24 NOTE — Telephone Encounter (Signed)
Pt states she having a little abdominal pain- this is more uncomfortable. Pt will come in next week to speak with you. Given instructions on how to proceed with dulcolax tabs. And 2 day prep.

## 2017-06-26 ENCOUNTER — Inpatient Hospital Stay: Payer: Managed Care, Other (non HMO) | Attending: Hematology and Oncology

## 2017-06-26 MED ORDER — CYANOCOBALAMIN 1000 MCG/ML IJ SOLN: 1000.0000 ug | Freq: Once | INTRAMUSCULAR | Status: AC

## 2017-06-26 NOTE — Telephone Encounter (Signed)
Pain is not severe. Has an appt with you on 07/03/17

## 2017-07-03 ENCOUNTER — Ambulatory Visit: Payer: Managed Care, Other (non HMO) | Admitting: Gastroenterology

## 2017-07-04 ENCOUNTER — Telehealth: Payer: Self-pay

## 2017-07-04 NOTE — Telephone Encounter (Signed)
Pt calls to cancel her colonoscopy. She will contact office to reschedule. [place] notified. She just found out her father had cancer.

## 2017-07-07 ENCOUNTER — Ambulatory Visit: Payer: Managed Care, Other (non HMO) | Admitting: Gastroenterology

## 2017-07-11 ENCOUNTER — Ambulatory Visit
Admission: RE | Admit: 2017-07-11 | Payer: Managed Care, Other (non HMO) | Source: Ambulatory Visit | Admitting: Gastroenterology

## 2017-07-11 ENCOUNTER — Encounter: Admission: RE | Payer: Self-pay | Source: Ambulatory Visit

## 2017-07-11 SURGERY — COLONOSCOPY WITH PROPOFOL
Anesthesia: General

## 2017-07-24 ENCOUNTER — Inpatient Hospital Stay: Payer: Managed Care, Other (non HMO) | Attending: Hematology and Oncology

## 2017-07-24 DIAGNOSIS — E538 Deficiency of other specified B group vitamins: Secondary | ICD-10-CM | POA: Insufficient documentation

## 2017-07-24 DIAGNOSIS — Z79899 Other long term (current) drug therapy: Secondary | ICD-10-CM | POA: Diagnosis not present

## 2017-07-24 DIAGNOSIS — D500 Iron deficiency anemia secondary to blood loss (chronic): Secondary | ICD-10-CM

## 2017-07-24 MED ORDER — CYANOCOBALAMIN 1000 MCG/ML IJ SOLN
1000.0000 ug | Freq: Once | INTRAMUSCULAR | Status: AC
  Administered 2017-07-24: 1000 ug via INTRAMUSCULAR

## 2017-07-24 NOTE — Addendum Note (Signed)
Addended by: Edwyna Shell on: 07/24/2017 12:06 PM   Modules accepted: Orders

## 2017-07-25 ENCOUNTER — Ambulatory Visit (INDEPENDENT_AMBULATORY_CARE_PROVIDER_SITE_OTHER): Payer: Managed Care, Other (non HMO) | Admitting: Internal Medicine

## 2017-07-25 ENCOUNTER — Other Ambulatory Visit: Payer: Self-pay | Admitting: Internal Medicine

## 2017-07-25 DIAGNOSIS — M109 Gout, unspecified: Secondary | ICD-10-CM | POA: Diagnosis not present

## 2017-07-25 DIAGNOSIS — F32A Depression, unspecified: Secondary | ICD-10-CM

## 2017-07-25 DIAGNOSIS — Z6839 Body mass index (BMI) 39.0-39.9, adult: Secondary | ICD-10-CM

## 2017-07-25 DIAGNOSIS — K219 Gastro-esophageal reflux disease without esophagitis: Secondary | ICD-10-CM | POA: Diagnosis not present

## 2017-07-25 DIAGNOSIS — R739 Hyperglycemia, unspecified: Secondary | ICD-10-CM

## 2017-07-25 DIAGNOSIS — D500 Iron deficiency anemia secondary to blood loss (chronic): Secondary | ICD-10-CM | POA: Diagnosis not present

## 2017-07-25 DIAGNOSIS — E538 Deficiency of other specified B group vitamins: Secondary | ICD-10-CM

## 2017-07-25 DIAGNOSIS — F329 Major depressive disorder, single episode, unspecified: Secondary | ICD-10-CM | POA: Diagnosis not present

## 2017-07-25 DIAGNOSIS — I10 Essential (primary) hypertension: Secondary | ICD-10-CM

## 2017-07-25 DIAGNOSIS — E039 Hypothyroidism, unspecified: Secondary | ICD-10-CM

## 2017-07-25 DIAGNOSIS — E7800 Pure hypercholesterolemia, unspecified: Secondary | ICD-10-CM

## 2017-07-25 DIAGNOSIS — D039 Melanoma in situ, unspecified: Secondary | ICD-10-CM | POA: Diagnosis not present

## 2017-07-25 DIAGNOSIS — R609 Edema, unspecified: Secondary | ICD-10-CM | POA: Diagnosis not present

## 2017-07-25 MED ORDER — ALLOPURINOL 300 MG PO TABS
300.0000 mg | ORAL_TABLET | Freq: Every day | ORAL | 2 refills | Status: DC
Start: 2017-07-25 — End: 2017-07-25

## 2017-07-25 NOTE — Progress Notes (Signed)
Patient ID: Lori Le, female   DOB: 27-Mar-1966, 52 y.o.   MRN: [id number]   Subjective:    Patient ID: Lori Le, female    DOB: 07/29/1965, 52 y.o.   MRN: [id number]  HPI  Patient here for a scheduled follow up.  She just saw ortho for left knee pain.  Diagnosed with synovitis of left knee.  Was given prednisone and tramadol.  Is better.  Also has been seeing podiatry for presumed gout. This has limited her activity.  She has gained weight.  Increased stress.  Discussed with her today.  On cymbalta.  Planning to see her therapist.  No chest pain.  Breathing stable.  No acid reflux.  No abdominal pain.  Has f/u with dermatology next week to have stitches out - for melanoma.     Past Medical History:  Diagnosis Date  . Allergic rhinitis due to allergen   . Allergy   . Anemia   . Arthritis   . Cancer ([place])    skin ca  . Chronic idiopathic urticaria   . Complication of anesthesia    nausea, slow to wake up  . Depression   . GERD (gastroesophageal reflux disease)   . Gout   . Gout   . Hashimoto's thyroiditis   . Heart murmur   . History of eating disorder   . Hives   . Hyperlipidemia   . Hypertension   . Hypothyroidism   . Lower extremity edema   . 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 Migraine   . Mitral valve disorder   . Motion sickness    all moving vehicles  . PCOS (polycystic ovarian syndrome)   . PONV (postoperative nausea and vomiting)   . Thyroid disease   . Ulcer   . Vertigo    last episode over 1 yr ago   Past Surgical History:  Procedure Laterality Date  . BREAST BIOPSY Right    neg- bx/clip  . CHOLECYSTECTOMY  2003  . DILITATION & CURRETTAGE/HYSTROSCOPY WITH NOVASURE ABLATION N/A 08/19/2016   Procedure: DILATATION & CURETTAGE/HYSTEROSCOPY WITH NOVASURE ABLATION;  Surgeon: [name], MD;  Location: [facility] ORS;  Service: Gynecology;  Laterality: N/A;  . ESOPHAGOGASTRODUODENOSCOPY (EGD) WITH PROPOFOL N/A 12/05/2015   Procedure:  ESOPHAGOGASTRODUODENOSCOPY (EGD) WITH PROPOFOL;  Surgeon: Lollie Sails, MD;  Location[facility]COPY;  Service: Endoscopy;  Laterality: N/A;  . ESOPHAGOGASTRODUODENOSCOPY (EGD) WITH PROPOFOL N/A 02/06/2016   Procedure: ESOPHAGOGASTRODUODENOSCOPY (EGD) WITH PROPOFOL;  Surgeon: Lollie Sails, MD;  Location: [facility] ENDOSCOPY;  Service: Endoscopy;  Laterality: N/A;  . FOOT SURGERY  1994  . GASTRIC BYPASS  2011  . JOINT REPLACEMENT Left 09/27/2014   HIP  . JOINT REPLACEMENT Left 12/01/2014   KNEE  . KNEE ARTHROSCOPY Right   . KNEE ARTHROSCOPY Left 03/23/2015   Procedure: ARTHROSCOPY KNEE, partial synovectomy;  Surgeon: Hessie Knows, MD;  Location: [facility] ORS;  Service: Orthopedics;  Laterality: Left;  . LAPAROSCOPIC GASTRIC BANDING  2010  . ROTATOR CUFF REPAIR Bilateral    right shoulder 07-26-2016  . SHOULDER ARTHROSCOPY Left 06/20/2015   Procedure: ARTHROSCOPY SHOULDER, REPAIR OF MASSIVE ROTATOR CUFF TEAR, TENODESIS, DECOMPRESSION DEBRIDEMENT;  Surgeon: Corky Mull, MD;  Location: [facility] ORS;  Service: Orthopedics;  Laterality: Left;  . TOTAL HIP ARTHROPLASTY Left 09/27/2014   Procedure: TOTAL HIP ARTHROPLASTY ANTERIOR APPROACH;  Surgeon: Hessie Knows, MD;  Location: [facility] ORS;  Service: Orthopedics;  Laterality: Left;  . TOTAL KNEE ARTHROPLASTY Left 12/01/2014  Procedure: TOTAL KNEE ARTHROPLASTY;  Surgeon: Hessie Knows, MD;  Location: [facility] ORS;  Service: Orthopedics;  Laterality: Left;   Family History  Problem Relation Age of Onset  . Arthritis Mother   . Hyperlipidemia Mother   . Hypertension Mother   . Diabetes Mother   . Arthritis Father   . Hyperlipidemia Father   . Hypertension Father   . Mental illness Father   . Diabetes Father   . Arthritis Maternal Grandmother   . Cancer Maternal Grandmother        breast cancer  . Hyperlipidemia Maternal Grandmother   . Hypertension Maternal Grandmother   . Breast cancer Maternal Grandmother   . Arthritis Maternal Grandfather   .  Hyperlipidemia Maternal Grandfather   . Hypertension Maternal Grandfather   . Heart disease Maternal Grandfather        heart attack  . Breast cancer Paternal Grandmother    Social History   Socioeconomic History  . Marital status: Divorced    Spouse name: Not on file  . Number of children: Not on file  . Years of education: Not on file  . Highest education level: Not on file  Occupational History  . Not on file  Social Needs  . Financial resource strain: Not on file  . Food insecurity:    Worry: Not on file    Inability: Not on file  . Transportation needs:    Medical: Not on file    Non-medical: Not on file  Tobacco Use  . Smoking status: Never Smoker  . Smokeless tobacco: Never Used  Substance and Sexual Activity  . Alcohol use: Yes    Alcohol/week: 0.0 - 0.6 oz    Comment: occ.  . Drug use: No  . Sexual activity: Yes    Birth control/protection: Surgical    Comment: Ablation   Lifestyle  . Physical activity:    Days per week: Not on file    Minutes per session: Not on file  . Stress: Not on file  Relationships  . Social connections:    Talks on phone: Not on file    Gets together: Not on file    Attends religious service: Not on file    Active member of club or organization: Not on file    Attends meetings of clubs or organizations: Not on file    Relationship status: Not on file  Other Topics Concern  . Not on file  Social History Narrative  . Not on file    Outpatient Encounter Medications as of 07/25/2017  Medication Sig  . acetaminophen (TYLENOL) 650 MG CR tablet Take 1,300 mg by mouth 2 (two) times daily.  [name] azelastine (ASTELIN) 0.1 % nasal spray U 1 TO 2 SPRAYS IEN BID  . DULoxetine (CYMBALTA) 60 MG capsule Take 1 capsule (60 mg total) by mouth daily.  [name] DYMISTA 137-50 MCG/ACT SUSP SPRAY 1 SPRAY INTO EACH NOSTRIL BID  . furosemide (LASIX) 40 MG tablet TAKE 1 TABLET (40 MG TOTAL) BY MOUTH DAILY.  [name] levocetirizine (XYZAL) 5 MG tablet every evening.    [name] levothyroxine (SYNTHROID, LEVOTHROID) 137 MCG tablet TAKE ONE TABLET BY MOUTH ONCE DAILY BEFORE BREAKFAST (Patient taking differently: Take 137 mcg by mouth daily before breakfast. DAILY BEFORE BREAKFAST)  . methylPREDNISolone (MEDROL DOSEPAK) 4 MG TBPK tablet TK UTD FOR 6 DAYS  . montelukast (SINGULAIR) 10 MG tablet Take 10 mg by mouth daily.  . mupirocin ointment (BACTROBAN) 2 % Place 1 application into the nose 2 (two)  times daily. (Patient not taking: Reported on 05/14/2017)  . pantoprazole (PROTONIX) 40 MG tablet TAKE 1 TABLET BY MOUTH EVERY DAY  . potassium chloride (K-DUR,KLOR-CON) 10 MEQ tablet TAKE 1 TABLET BY MOUTH TWICE DAILY FOR 3 DAYS THEN DAILY  . triamterene-hydrochlorothiazide (MAXZIDE-25) 37.5-25 MG tablet Take 1 tablet by mouth daily.  . [DISCONTINUED] allopurinol (ZYLOPRIM) 300 MG tablet Take 1 tablet (300 mg total) by mouth daily.  . [DISCONTINUED] potassium chloride (KLOR-CON 10) 10 MEQ tablet TAKE ONE TABLET TWICE FOR THREE DAYS AND THEN DAILY. *MAINT REFILLS EXCEEDED* (Patient not taking: Reported on 06/18/2017)   Facility-Administered Encounter Medications as of 07/25/2017  Medication  . 0.9 %  sodium chloride infusion  . cyanocobalamin ((VITAMIN B-12)) injection 1,000 mcg    Review of Systems  Constitutional: Negative for appetite change and unexpected weight change.  HENT: Negative for congestion and sinus pressure.   Respiratory: Negative for cough, chest tightness and shortness of breath.   Cardiovascular: Negative for chest pain and palpitations.  Gastrointestinal: Negative for abdominal pain, diarrhea, nausea and vomiting.  Genitourinary: Negative for difficulty urinating and dysuria.  Musculoskeletal:       Persistent intermittent foot pain and swelling.  Being followed by podiatry.  Knee pain better.   Skin: Negative for color change and rash.  Neurological: Negative for dizziness, light-headedness and headaches.  Psychiatric/Behavioral: Negative for  agitation and dysphoric mood.       Objective:    Physical Exam  Constitutional: She appears well-developed and well-nourished. No distress.  HENT:  Nose: Nose normal.  Mouth/Throat: Oropharynx is clear and moist.  Neck: Neck supple. No thyromegaly present.  Cardiovascular: Normal rate and regular rhythm.  Pulmonary/Chest: Breath sounds normal. No respiratory distress. She has no wheezes.  Abdominal: Soft. Bowel sounds are normal. There is no tenderness.  Musculoskeletal: She exhibits no tenderness.  Leg wrapped.  No increased edema.    Lymphadenopathy:    She has no cervical adenopathy.  Skin: No rash noted. No erythema.  Psychiatric: She has a normal mood and affect. Her behavior is normal.    BP 140/82 (BP Location: Left Arm, Patient Position: Sitting, Cuff Size: Large)   Pulse 80   Temp 98.6 F (37 C) (Oral)   Resp 18   Wt 249 lb 9.6 oz (113.2 kg)   SpO2 98%   BMI 39.09 kg/m  Wt Readings from Last 3 Encounters:  07/25/17 249 lb 9.6 oz (113.2 kg)  06/18/17 248 lb 1.6 oz (112.5 kg)  05/24/17 240 lb (108.9 kg)     Lab Results  Component Value Date   WBC 7.0 04/28/2017   HGB 12.8 04/28/2017   HCT 39.2 04/28/2017   PLT 244 04/28/2017   GLUCOSE 82 04/10/2017   CHOL 202 (H) 04/10/2017   TRIG 265 (H) 04/10/2017   HDL 67 04/10/2017   LDLDIRECT 121.0 11/16/2014   LDLCALC 82 04/10/2017   ALT 12 04/10/2017   AST 9 04/10/2017   NA 140 04/10/2017   K 3.7 04/10/2017   CL 96 04/10/2017   CREATININE 0.65 04/10/2017   BUN 15 04/10/2017   CO2 29 04/10/2017   TSH 0.54 12/26/2016   INR 0.99 03/08/2016   HGBA1C 5.5 04/10/2017    Mr Foot Right W Wo Contrast  Result Date: 05/24/2017 CLINICAL DATA:  History of stepping on glass in November 9826 complicated by infection, status post incision and drainage last week. EXAM: MRI OF THE RIGHT FOREFOOT WITHOUT AND WITH CONTRAST TECHNIQUE: Multiplanar, multisequence MR imaging of  the right forefoot was performed before and after  the administration of intravenous contrast. CONTRAST:  75m MULTIHANCE GADOBENATE DIMEGLUMINE 529 MG/ML IV SOLN COMPARISON:  Right foot x-rays dated February 24, 2017. FINDINGS: Bones/Joint/Cartilage No suspicious marrow signal abnormality. Small focus of degenerative marrow edema in the cuboid at the fourth tarsometatarsal joint. No fracture or dislocation. Normal alignment. No joint effusion. Susceptibility artifact related to cerclage wire in the first proximal phalanx. Ligaments Collateral ligaments are intact.  Lisfranc ligament is intact. Muscles and Tendons Flexor, peroneal and extensor compartment tendons are intact. Prominent increased T2 signal and enhancement of the intrinsic forefoot muscles surrounding the mid to distal third metatarsal. Soft tissue Small area of ulceration at the plantar base of the third metatarsal head. No fluid collection or hematoma. No soft tissue mass. IMPRESSION: 1. Prominent edema and enhancement of the intrinsic forefoot muscles surrounding the mid to distal third metatarsal, nonspecific, but suspicious for myositis given clinical history. No drainable fluid collection. 2. Small ulceration at the plantar base of the third metatarsal head may represent the site of recent incision and drainage. Correlate clinically. 3. No evidence of osteomyelitis. Electronically Signed   By: WTitus DubinM.D.   On: 05/24/2017 10:29       Assessment & Plan:   Problem List Items Addressed This Visit    Anemia    Has been evaluated by hematology.  Has received iron infusions.        Relevant Orders   CBC with Differential/Platelet   Ferritin   B12 deficiency    Continue b12 injections.       BMI 39.0-39.9,adult    Discussed diet and exercise.  Follow.       Depression    On cymbalta.  Planning to f/u with her therapist.        Edema    No increased lower extremity swelling.  Follow       Essential hypertension, benign    Blood pressure has been under good control.   Continue same medication regimen.  Follow pressures.  Follow metabolic panel.        Relevant Orders   Basic metabolic panel   TSH   GERD (gastroesophageal reflux disease)    Controlled on current regimen.  Follow.        Gout    Check uric acid level.  Start allopurinol after acute flare.  Currently improved on prednisone.        Relevant Orders   Uric acid   Hypercholesterolemia    Low cholesterol diet and exercise.  Follow lipid panel.        Relevant Orders   Hepatic function panel   Lipid panel   Hyperglycemia    Low carb diet and exercise.  Follow met b and a1c.        Relevant Orders   Hemoglobin A1c   Hypothyroidism    On thyroid replacement.  Follow tsh.        Melanoma in situ ([facility]    Followed by dermatology.  Has f/u next week to get sutures removed.           SEinar Pheasant MD

## 2017-07-27 ENCOUNTER — Encounter: Payer: Self-pay | Admitting: Internal Medicine

## 2017-07-27 NOTE — Assessment & Plan Note (Addendum)
Check uric acid level.  Start allopurinol after acute flare.  Currently improved on prednisone.

## 2017-07-27 NOTE — Assessment & Plan Note (Signed)
Low cholesterol diet and exercise.  Follow lipid panel.   

## 2017-07-27 NOTE — Assessment & Plan Note (Signed)
No increased lower extremity swelling.  Follow

## 2017-07-27 NOTE — Assessment & Plan Note (Signed)
Followed by dermatology.  Has f/u next week to get sutures removed.

## 2017-07-27 NOTE — Assessment & Plan Note (Signed)
Low carb diet and exercise.  Follow met b and a1c.   

## 2017-07-27 NOTE — Assessment & Plan Note (Signed)
On cymbalta.  Planning to f/u with her therapist.

## 2017-07-27 NOTE — Assessment & Plan Note (Signed)
Discussed diet and exercise.  Follow.  

## 2017-07-27 NOTE — Assessment & Plan Note (Signed)
Continue b12 injections.  

## 2017-07-27 NOTE — Assessment & Plan Note (Signed)
Blood pressure has been under good control.  Continue same medication regimen.  Follow pressures.  Follow metabolic panel.   

## 2017-07-27 NOTE — Assessment & Plan Note (Signed)
Controlled on current regimen.  Follow.  

## 2017-07-27 NOTE — Assessment & Plan Note (Signed)
On thyroid replacement.  Follow tsh.  

## 2017-07-27 NOTE — Assessment & Plan Note (Signed)
Has been evaluated by hematology.  Has received iron infusions.

## 2017-07-28 ENCOUNTER — Other Ambulatory Visit: Payer: Self-pay | Admitting: Urgent Care

## 2017-07-28 ENCOUNTER — Inpatient Hospital Stay: Payer: Managed Care, Other (non HMO)

## 2017-07-28 ENCOUNTER — Other Ambulatory Visit: Payer: Self-pay

## 2017-07-28 DIAGNOSIS — D649 Anemia, unspecified: Secondary | ICD-10-CM

## 2017-07-28 DIAGNOSIS — E538 Deficiency of other specified B group vitamins: Secondary | ICD-10-CM | POA: Diagnosis not present

## 2017-07-28 LAB — CBC WITH DIFFERENTIAL/PLATELET
BASOS PCT: 1 %
Basophils Absolute: 0.1 10*3/uL (ref 0–0.1)
EOS ABS: 0.1 10*3/uL (ref 0–0.7)
Eosinophils Relative: 1 %
HEMATOCRIT: 38.8 % (ref 35.0–47.0)
HEMOGLOBIN: 13.2 g/dL (ref 12.0–16.0)
LYMPHS ABS: 2.7 10*3/uL (ref 1.0–3.6)
Lymphocytes Relative: 27 %
MCH: 30.5 pg (ref 26.0–34.0)
MCHC: 34.1 g/dL (ref 32.0–36.0)
MCV: 89.5 fL (ref 80.0–100.0)
MONOS PCT: 7 %
Monocytes Absolute: 0.7 10*3/uL (ref 0.2–0.9)
NEUTROS ABS: 6.5 10*3/uL (ref 1.4–6.5)
NEUTROS PCT: 64 %
Platelets: 254 10*3/uL (ref 150–440)
RBC: 4.33 MIL/uL (ref 3.80–5.20)
RDW: 13.5 % (ref 11.5–14.5)
WBC: 10.2 10*3/uL (ref 3.6–11.0)

## 2017-07-28 LAB — FERRITIN: Ferritin: 18 ng/mL (ref 11–307)

## 2017-08-01 ENCOUNTER — Inpatient Hospital Stay: Payer: Managed Care, Other (non HMO)

## 2017-08-04 ENCOUNTER — Inpatient Hospital Stay: Payer: Managed Care, Other (non HMO) | Attending: Hematology and Oncology

## 2017-08-04 VITALS — BP 126/69 | HR 76 | Temp 98.1°F | Resp 18

## 2017-08-04 DIAGNOSIS — D509 Iron deficiency anemia, unspecified: Secondary | ICD-10-CM | POA: Diagnosis present

## 2017-08-04 DIAGNOSIS — D500 Iron deficiency anemia secondary to blood loss (chronic): Secondary | ICD-10-CM

## 2017-08-04 MED ORDER — SODIUM CHLORIDE 0.9 % IV SOLN
INTRAVENOUS | Status: DC
  Administered 2017-08-04: 14:00:00 via INTRAVENOUS
  Filled 2017-08-04: qty 1000

## 2017-08-04 MED ORDER — IRON SUCROSE 20 MG/ML IV SOLN
200.0000 mg | Freq: Once | INTRAVENOUS | Status: AC
  Administered 2017-08-04: 200 mg via INTRAVENOUS
  Filled 2017-08-04: qty 10

## 2017-08-08 ENCOUNTER — Ambulatory Visit: Payer: Self-pay

## 2017-08-10 ENCOUNTER — Other Ambulatory Visit (HOSPITAL_COMMUNITY): Payer: Self-pay | Admitting: Psychiatry

## 2017-08-10 DIAGNOSIS — F331 Major depressive disorder, recurrent, moderate: Secondary | ICD-10-CM

## 2017-08-11 ENCOUNTER — Other Ambulatory Visit: Payer: Self-pay | Admitting: Internal Medicine

## 2017-08-11 ENCOUNTER — Inpatient Hospital Stay: Payer: Managed Care, Other (non HMO)

## 2017-08-11 DIAGNOSIS — Z760 Encounter for issue of repeat prescription: Secondary | ICD-10-CM

## 2017-08-12 ENCOUNTER — Ambulatory Visit (HOSPITAL_COMMUNITY): Payer: Self-pay | Admitting: Psychiatry

## 2017-08-13 ENCOUNTER — Inpatient Hospital Stay: Payer: Managed Care, Other (non HMO)

## 2017-08-13 ENCOUNTER — Other Ambulatory Visit (HOSPITAL_COMMUNITY): Payer: Self-pay

## 2017-08-13 VITALS — BP 108/68 | HR 74 | Resp 20

## 2017-08-13 DIAGNOSIS — D509 Iron deficiency anemia, unspecified: Secondary | ICD-10-CM | POA: Diagnosis not present

## 2017-08-13 DIAGNOSIS — D500 Iron deficiency anemia secondary to blood loss (chronic): Secondary | ICD-10-CM

## 2017-08-13 DIAGNOSIS — F331 Major depressive disorder, recurrent, moderate: Secondary | ICD-10-CM

## 2017-08-13 MED ORDER — CYANOCOBALAMIN 1000 MCG/ML IJ SOLN
1000.0000 ug | Freq: Once | INTRAMUSCULAR | Status: AC
  Administered 2017-08-13: 1000 ug via INTRAMUSCULAR
  Filled 2017-08-13: qty 1

## 2017-08-13 MED ORDER — DULOXETINE HCL 60 MG PO CPEP: 60.0000 mg | ORAL_CAPSULE | Freq: Every day | ORAL | 0 refills | Status: DC

## 2017-08-13 MED ORDER — SODIUM CHLORIDE 0.9 % IV SOLN
INTRAVENOUS | Status: DC
  Administered 2017-08-13: 14:00:00 via INTRAVENOUS
  Filled 2017-08-13: qty 1000

## 2017-08-13 MED ORDER — IRON SUCROSE 20 MG/ML IV SOLN
200.0000 mg | Freq: Once | INTRAVENOUS | Status: AC
  Administered 2017-08-13: 200 mg via INTRAVENOUS
  Filled 2017-08-13: qty 10

## 2017-08-19 ENCOUNTER — Ambulatory Visit (HOSPITAL_COMMUNITY): Payer: Self-pay | Admitting: Psychiatry

## 2017-08-19 ENCOUNTER — Other Ambulatory Visit: Payer: Self-pay | Admitting: Internal Medicine

## 2017-08-19 DIAGNOSIS — Z760 Encounter for issue of repeat prescription: Secondary | ICD-10-CM

## 2017-08-20 ENCOUNTER — Telehealth: Payer: Self-pay

## 2017-08-20 ENCOUNTER — Other Ambulatory Visit: Payer: Self-pay | Admitting: Internal Medicine

## 2017-08-20 DIAGNOSIS — Z760 Encounter for issue of repeat prescription: Secondary | ICD-10-CM

## 2017-08-20 NOTE — Telephone Encounter (Signed)
Copied from [place] [id number]. Topic: General - Other >> Aug 20, 2017  2:49 PM Yvette Rack wrote: Reason for CRM: patient calling stating that [facility] [place] [phone] (Phone) [phone] (Fax)    never received her RX for triamterene-hydrochlorothiazide (MAXZIDE-25) 37.5-25 MG tablet  Medication has been resent to [facility] in [place]

## 2017-08-21 ENCOUNTER — Inpatient Hospital Stay: Payer: Managed Care, Other (non HMO)

## 2017-08-30 IMAGING — CR DG KNEE 1-2V*L*
1 series · 2 of 2 positions shown · non-contrast
Comparison: CT left knee August 12, 2014

CLINICAL DATA: Status post total knee replacement

EXAM:
LEFT KNEE - 1-2 VIEW

[Series 1: ap · 0.17mm/px · 2 of 2 slices shown]
[im 1/2]
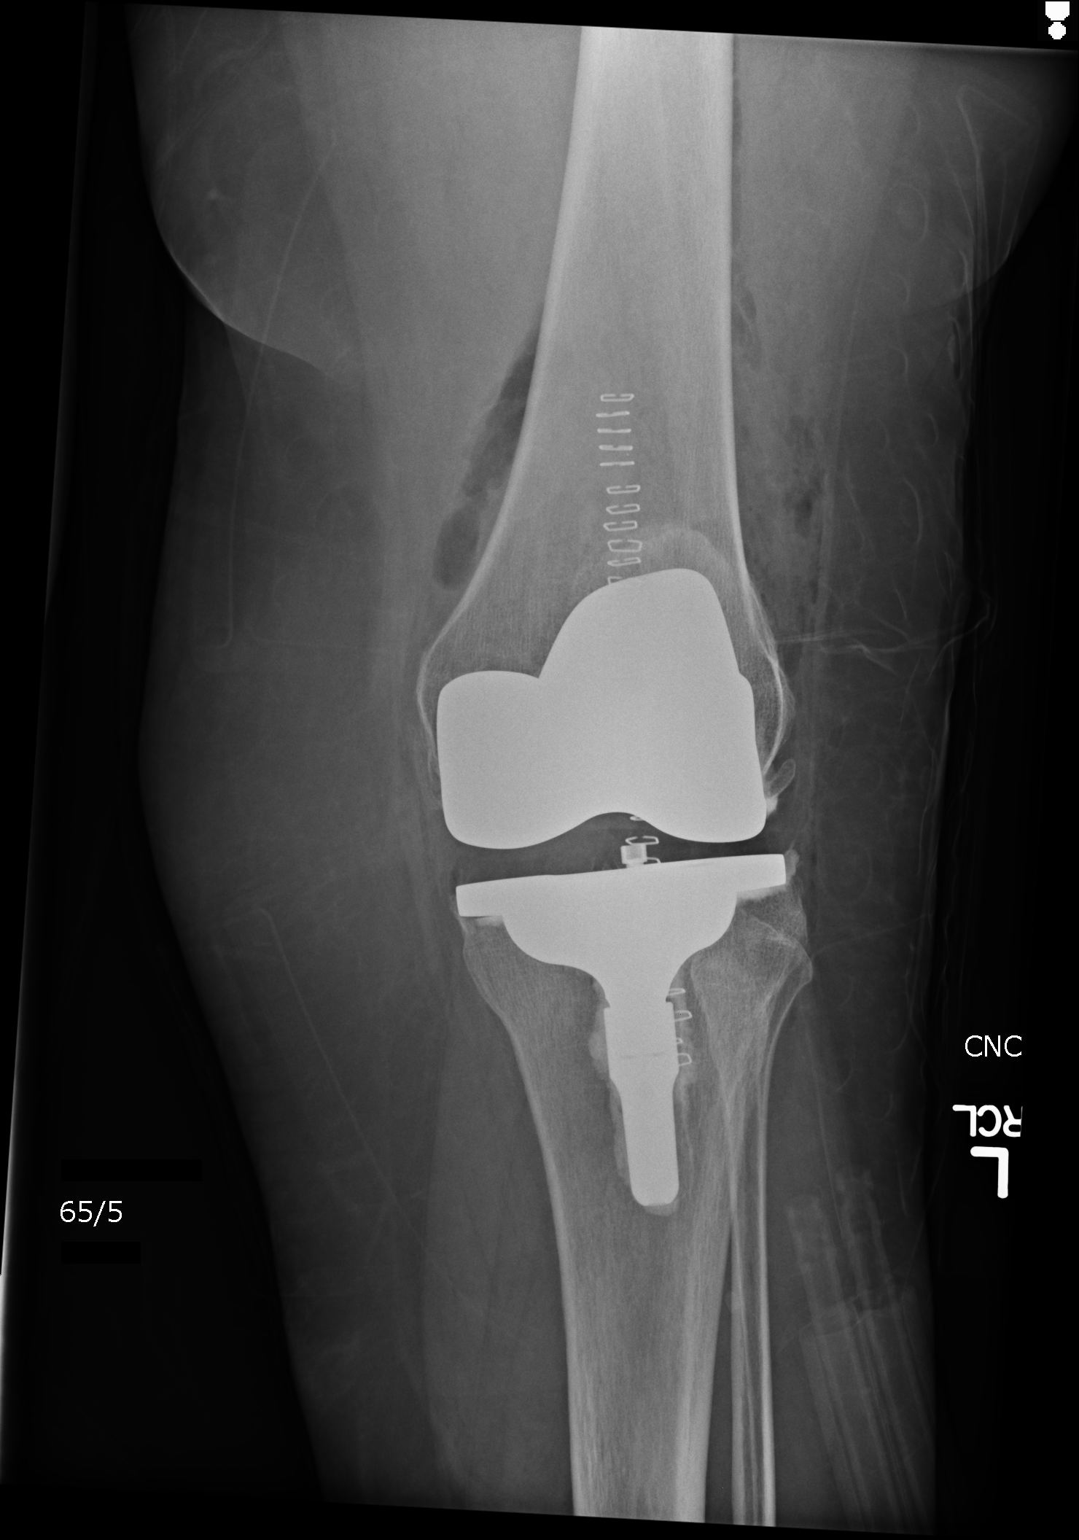
[im 2/2]
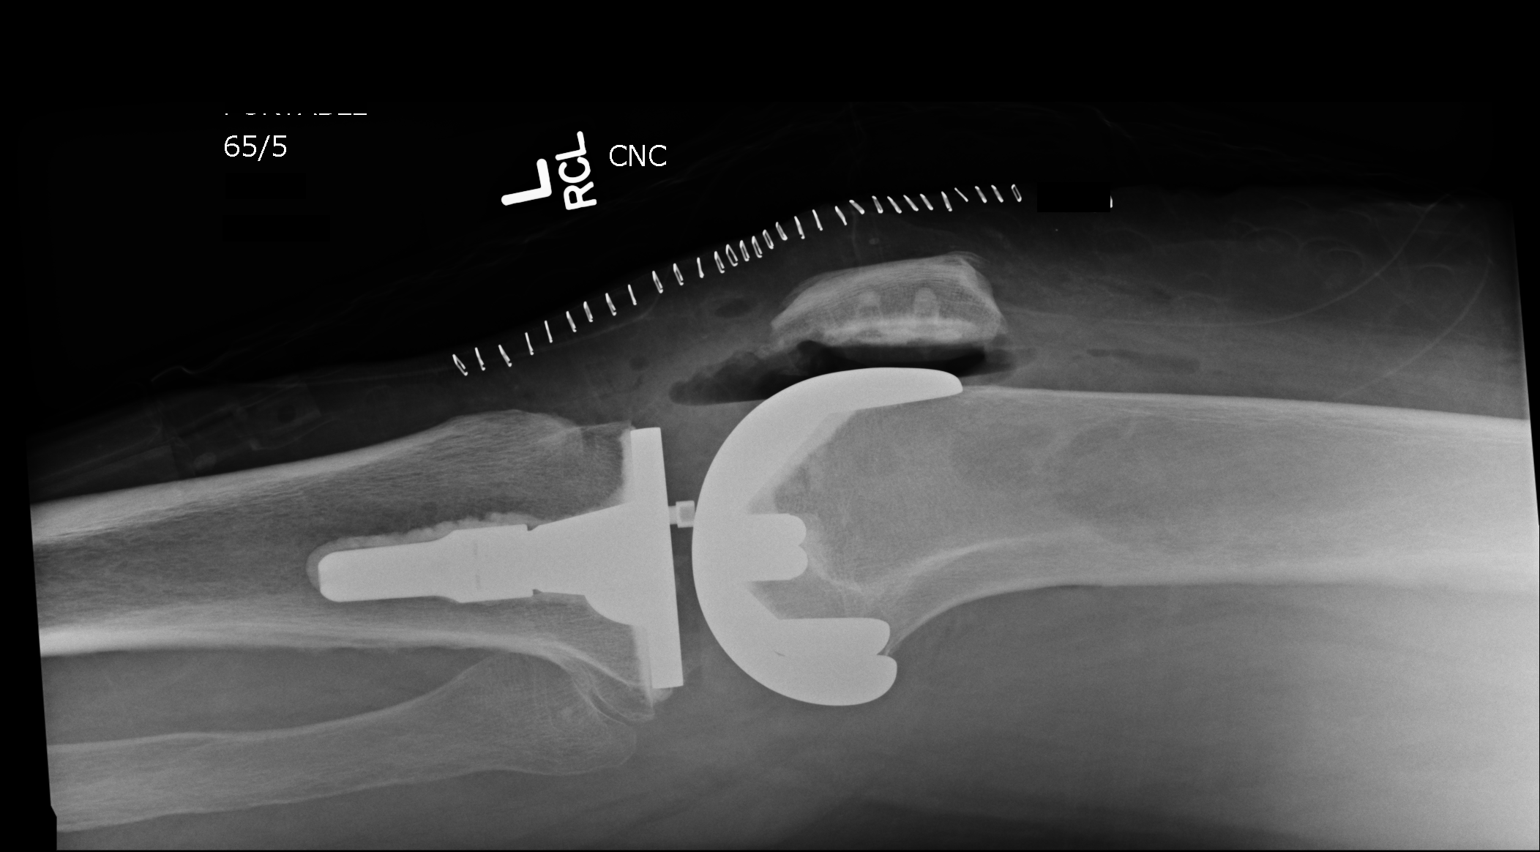

[2 of 2 positions shown; findings below may reference images not displayed]

FINDINGS: Frontal and lateral views obtained. Patient is status post total
knee replacement with the femoral and tibial prosthetic components
appearing well-seated. No acute fracture or dislocation. Air within
the joint is an expected postoperative finding.
IMPRESSION: Prosthetic components appear well seated. No acute fracture or
dislocation.

## 2017-09-03 ENCOUNTER — Ambulatory Visit (INDEPENDENT_AMBULATORY_CARE_PROVIDER_SITE_OTHER): Payer: Managed Care, Other (non HMO) | Admitting: Primary Care

## 2017-09-03 VITALS — BP 130/74 | HR 95 | Temp 98.0°F | Ht 67.0 in | Wt 254.2 lb

## 2017-09-03 DIAGNOSIS — J029 Acute pharyngitis, unspecified: Secondary | ICD-10-CM | POA: Diagnosis not present

## 2017-09-03 DIAGNOSIS — J3489 Other specified disorders of nose and nasal sinuses: Secondary | ICD-10-CM

## 2017-09-03 LAB — POCT RAPID STREP A (OFFICE): RAPID STREP A SCREEN: NEGATIVE

## 2017-09-03 MED ORDER — PREDNISONE 10 MG PO TABS: ORAL_TABLET | ORAL | 0 refills | Status: DC

## 2017-09-03 NOTE — Patient Instructions (Addendum)
You do not have strep throat.   Use your azelastine eye drops as needed for allergies.   Start prednisone tablets. Take three tablets for 2 days, then two tablets for 2 days, then one tablet for 2 days.  Please call me if you develop fevers, increased pain to the site under your eye, green drainage from your eye, mouth pain.  It was a pleasure meeting you!

## 2017-09-03 NOTE — Progress Notes (Signed)
Subjective:    Patient ID: Lori Le, female    DOB: [birth year], 52 y.o.   MRN: [id number]  HPI  Lori Le is a 52 year old female with a history of hypertension, migraines, GERD, environmental allergies who presents today with a chief complaint of sinus pressure.  She also reports headache, itching to the left eye, nasal congestion, sore throat. Her symptoms began three days ago with sore throat. Yesterday she developed itchy left eye with pressure to the maxillary sinus region while driving home.   She denies ear fullness, watery eyes, cough, fevers, eye drainage, dental pain. She's not taken anything OTC except for her current medications (Xyzal, azelastine eye drops, Dymista spray, Singulair).   Review of Systems  Constitutional: Negative for fever.  HENT: Positive for sinus pressure, sinus pain and sore throat. Negative for congestion and ear pain.   Eyes: Positive for itching. Negative for redness.  Respiratory: Negative for cough.        Past Medical History:  Diagnosis Date  . Allergic rhinitis due to allergen   . Allergy   . Anemia   . Arthritis   . Cancer ([place])    skin ca  . Chronic idiopathic urticaria   . Complication of anesthesia    nausea, slow to wake up  . Depression   . GERD (gastroesophageal reflux disease)   . Gout   . Gout   . Hashimoto's thyroiditis   . Heart murmur   . History of eating disorder   . Hives   . Hyperlipidemia   . Hypertension   . Hypothyroidism   . Lower extremity edema   . Melanoma in situ ([place])    left shoulder  . Migraine   . Mitral valve disorder   . Motion sickness    all moving vehicles  . PCOS (polycystic ovarian syndrome)   . PONV (postoperative nausea and vomiting)   . Thyroid disease   . Ulcer   . Vertigo    last episode over 1 yr ago     Social History   Socioeconomic History  . Marital status: Divorced    Spouse name: Not on file  . Number of children: Not on file  . Years of education: Not on file    . Highest education level: Not on file  Occupational History  . Not on file  Social Needs  . Financial resource strain: Not on file  . Food insecurity:    Worry: Not on file    Inability: Not on file  . Transportation needs:    Medical: Not on file    Non-medical: Not on file  Tobacco Use  . Smoking status: Never Smoker  . Smokeless tobacco: Never Used  Substance and Sexual Activity  . Alcohol use: Yes    Alcohol/week: 0.0 - 0.6 oz    Comment: occ.  . Drug use: No  . Sexual activity: Yes    Birth control/protection: Surgical    Comment: Ablation   Lifestyle  . Physical activity:    Days per week: Not on file    Minutes per session: Not on file  . Stress: Not on file  Relationships  . Social connections:    Talks on phone: Not on file    Gets together: Not on file    Attends religious service: Not on file    Active member of club or organization: Not on file    Attends meetings of clubs or organizations: Not on file  Relationship status: Not on file  . Intimate partner violence:    Fear of current or ex partner: Not on file    Emotionally abused: Not on file    Physically abused: Not on file    Forced sexual activity: Not on file  Other Topics Concern  . Not on file  Social History Narrative  . Not on file    Past Surgical History:  Procedure Laterality Date  . BREAST BIOPSY Right    neg- bx/clip  . CHOLECYSTECTOMY  2003  . DILITATION & CURRETTAGE/HYSTROSCOPY WITH NOVASURE ABLATION N/A 08/19/2016   Procedure: DILATATION & CURETTAGE/HYSTEROSCOPY WITH NOVASURE ABLATION;  Surgeon: [name], MD;  Location: [facility] ORS;  Service: Gynecology;  Laterality: N/A;  . ESOPHAGOGASTRODUODENOSCOPY (EGD) WITH PROPOFOL N/A 12/05/2015   Procedure: ESOPHAGOGASTRODUODENOSCOPY (EGD) WITH PROPOFOL;  Surgeon: Lollie Sails, MD;  Location: [facility] ENDOSCOPY;  Service: Endoscopy;  Laterality: N/A;  . ESOPHAGOGASTRODUODENOSCOPY (EGD) WITH PROPOFOL N/A 02/06/2016   Procedure:  ESOPHAGOGASTRODUODENOSCOPY (EGD) WITH PROPOFOL;  Surgeon: Lollie Sails, MD;  Location: [facility] ENDOSCOPY;  Service: Endoscopy;  Laterality: N/A;  . FOOT SURGERY  1994  . GASTRIC BYPASS  2011  . JOINT REPLACEMENT Left 09/27/2014   HIP  . JOINT REPLACEMENT Left 12/01/2014   KNEE  . KNEE ARTHROSCOPY Right   . KNEE ARTHROSCOPY Left 03/23/2015   Procedure: ARTHROSCOPY KNEE, partial synovectomy;  Surgeon: Hessie Knows, MD;  Location: [facility] ORS;  Service: Orthopedics;  Laterality: Left;  . LAPAROSCOPIC GASTRIC BANDING  2010  . ROTATOR CUFF REPAIR Bilateral    right shoulder 07-26-2016  . SHOULDER ARTHROSCOPY Left 06/20/2015   Procedure: ARTHROSCOPY SHOULDER, REPAIR OF MASSIVE ROTATOR CUFF TEAR, TENODESIS, DECOMPRESSION DEBRIDEMENT;  Surgeon: Corky Mull, MD;  Location: [facility] ORS;  Service: Orthopedics;  Laterality: Left;  . TOTAL HIP ARTHROPLASTY Left 09/27/2014   Procedure: TOTAL HIP ARTHROPLASTY ANTERIOR APPROACH;  Surgeon: Hessie Knows, MD;  Location: [facility] ORS;  Service: Orthopedics;  Laterality: Left;  . TOTAL KNEE ARTHROPLASTY Left 12/01/2014   Procedure: TOTAL KNEE ARTHROPLASTY;  Surgeon: Hessie Knows, MD;  Location: [facility] ORS;  Service: Orthopedics;  Laterality: Left;    Family History  Problem Relation Age of Onset  . Arthritis Mother   . Hyperlipidemia Mother   . Hypertension Mother   . Diabetes Mother   . Arthritis Father   . Hyperlipidemia Father   . Hypertension Father   . Mental illness Father   . Diabetes Father   . Arthritis Maternal Grandmother   . Cancer Maternal Grandmother        breast cancer  . Hyperlipidemia Maternal Grandmother   . Hypertension Maternal Grandmother   . Breast cancer Maternal Grandmother   . Arthritis Maternal Grandfather   . Hyperlipidemia Maternal Grandfather   . Hypertension Maternal Grandfather   . Heart disease Maternal Grandfather        heart attack  . Breast cancer Paternal Grandmother     Allergies  Allergen Reactions  . Etodolac  Anaphylaxis  . Influenza Vaccines Hives  . Lactose Nausea And Vomiting    Can have yogurt Can not have milk and ice cream   . Lactose Intolerance (Gi) Nausea And Vomiting    Can have yogurt Can not have milk and ice cream    . Bacitracin-Neomycin-Polymyxin Rash  . Contrast Media [Iodinated Diagnostic Agents] Rash    MRI dye  . Neomycin-Bacitracin Zn-Polymyx Rash  . Neosporin + Pain Relief Max St [Neomy-Bacit-Polymyx-Pramoxine] Rash    Current Outpatient Medications on File  Prior to Visit  Medication Sig Dispense Refill  . allopurinol (ZYLOPRIM) 300 MG tablet TAKE 1 TABLET BY MOUTH DAILY 90 tablet 2  . azelastine (ASTELIN) 0.1 % nasal spray U 1 TO 2 SPRAYS IEN BID  6  . DULoxetine (CYMBALTA) 60 MG capsule Take 1 capsule (60 mg total) by mouth daily. 30 capsule 0  . DYMISTA 137-50 MCG/ACT SUSP SPRAY 1 SPRAY INTO EACH NOSTRIL BID  2  . furosemide (LASIX) 40 MG tablet TAKE 1 TABLET (40 MG TOTAL) BY MOUTH DAILY. 90 tablet 1  . levocetirizine (XYZAL) 5 MG tablet every evening.  3  . levothyroxine (SYNTHROID, LEVOTHROID) 137 MCG tablet TAKE ONE TABLET BY MOUTH ONCE DAILY BEFORE BREAKFAST (Patient taking differently: Take 137 mcg by mouth daily before breakfast. DAILY BEFORE BREAKFAST) 90 tablet 0  . montelukast (SINGULAIR) 10 MG tablet Take 10 mg by mouth daily.  3  . pantoprazole (PROTONIX) 40 MG tablet TAKE 1 TABLET BY MOUTH EVERY DAY 90 tablet 1  . potassium chloride (K-DUR,KLOR-CON) 10 MEQ tablet TAKE 1 TABLET BY MOUTH TWICE DAILY FOR 3 DAYS THEN DAILY 100 tablet 1  . triamterene-hydrochlorothiazide (MAXZIDE-25) 37.5-25 MG tablet TAKE 1 TABLET BY MOUTH EVERY DAY 60 tablet 0  . acetaminophen (TYLENOL) 650 MG CR tablet Take 1,300 mg by mouth 2 (two) times daily.    . methylPREDNISolone (MEDROL DOSEPAK) 4 MG TBPK tablet TK UTD FOR 6 DAYS  0  . mupirocin ointment (BACTROBAN) 2 % Place 1 application into the nose 2 (two) times daily. (Patient not taking: Reported on 09/03/2017) 22 g 0    Current Facility-Administered Medications on File Prior to Visit  Medication Dose Route Frequency Provider Last Rate Last Dose  . 0.9 %  sodium chloride infusion   Intravenous Continuous Lequita Asal, MD   Stopped at 05/08/17 1437  . cyanocobalamin ((VITAMIN B-12)) injection 1,000 mcg  1,000 mcg Intramuscular Once Corcoran, Melissa C, MD        BP 130/74   Pulse 95   Temp 98 F (36.7 C) (Oral)   Ht 5\' 7"  (1.702 m)   Wt 254 lb 4 oz (115.3 kg)   SpO2 98%   BMI 39.82 kg/m    Objective:   Physical Exam  Constitutional: She appears well-nourished. She does not appear ill.  HENT:  Right Ear: Tympanic membrane and ear canal normal.  Left Ear: Tympanic membrane and ear canal normal.  Nose: No mucosal edema. Right sinus exhibits no maxillary sinus tenderness and no frontal sinus tenderness. Left sinus exhibits maxillary sinus tenderness. Left sinus exhibits no frontal sinus tenderness.  Mouth/Throat: Oropharynx is clear and moist.  Naturally enlarged tonsils, no erythema or exudate  Neck: Neck supple.  Cardiovascular: Normal rate and regular rhythm.  Respiratory: Effort normal and breath sounds normal. She has no wheezes.  Skin: Skin is warm and dry.           Assessment & Plan:  Sinus Pressure:  Located to left maxillary region since yesterday, also with itchy left eye, sore throat. Exam today with tenderness to left maxillary region, no tenderness to eyes.  No evidence of strep pharyngitis, parotitis, dental abscess, otitis media, bacterial conjunctivitis.  Suspect sinus and allergy involvement including allergic conjunctivitis. Rapid strep negative. Rx for low dose prednisone course provided to reduce inflammation to the sinus cavity. Discussed to continue Astelin eye drops, nasal spray, Singular.  Strict return precautions provided.   Pleas Koch, NP

## 2017-09-05 ENCOUNTER — Encounter: Payer: Self-pay | Admitting: Internal Medicine

## 2017-09-09 ENCOUNTER — Encounter: Payer: Self-pay | Admitting: Internal Medicine

## 2017-09-10 ENCOUNTER — Telehealth: Payer: Self-pay

## 2017-09-10 NOTE — Telephone Encounter (Signed)
Left message to call back to get more info

## 2017-09-10 NOTE — Telephone Encounter (Signed)
Copied from [place]. Topic: Inquiry >> Sep 10, 2017 11:53 AM Pricilla Handler wrote: Reason for CRM: Patient called returning Garnette Scheuermann' call. Please call patient back.       Thank You!!!

## 2017-09-10 NOTE — Telephone Encounter (Signed)
ED suggested that she take fluid pills first thing in the morning and one more in the afternoon. They suggested that she f/u with you. Swelling is better and she has lost 18-20 lbs since Friday just from increasing fluid pill. I have scheduled pt with you on Friday at 2:30.

## 2017-09-10 NOTE — Telephone Encounter (Signed)
Please call pt to get more information.  See her my chart message.  Where was she seen?  Need records.  Not in our system.  Need to know how she is doing and what specific symptoms having.  I have a spot Friday at 2:30, but needed more info.

## 2017-09-10 NOTE — Telephone Encounter (Signed)
Pt scheduled  

## 2017-09-12 ENCOUNTER — Ambulatory Visit (INDEPENDENT_AMBULATORY_CARE_PROVIDER_SITE_OTHER): Payer: Managed Care, Other (non HMO) | Admitting: Internal Medicine

## 2017-09-12 ENCOUNTER — Encounter: Payer: Self-pay | Admitting: Internal Medicine

## 2017-09-12 DIAGNOSIS — E538 Deficiency of other specified B group vitamins: Secondary | ICD-10-CM

## 2017-09-12 DIAGNOSIS — Z760 Encounter for issue of repeat prescription: Secondary | ICD-10-CM | POA: Diagnosis not present

## 2017-09-12 DIAGNOSIS — I10 Essential (primary) hypertension: Secondary | ICD-10-CM | POA: Diagnosis not present

## 2017-09-12 DIAGNOSIS — D500 Iron deficiency anemia secondary to blood loss (chronic): Secondary | ICD-10-CM

## 2017-09-12 DIAGNOSIS — M109 Gout, unspecified: Secondary | ICD-10-CM

## 2017-09-12 DIAGNOSIS — R609 Edema, unspecified: Secondary | ICD-10-CM

## 2017-09-12 DIAGNOSIS — E039 Hypothyroidism, unspecified: Secondary | ICD-10-CM

## 2017-09-12 DIAGNOSIS — K219 Gastro-esophageal reflux disease without esophagitis: Secondary | ICD-10-CM

## 2017-09-12 DIAGNOSIS — F329 Major depressive disorder, single episode, unspecified: Secondary | ICD-10-CM

## 2017-09-12 DIAGNOSIS — R739 Hyperglycemia, unspecified: Secondary | ICD-10-CM

## 2017-09-12 DIAGNOSIS — F32A Depression, unspecified: Secondary | ICD-10-CM

## 2017-09-12 DIAGNOSIS — E7800 Pure hypercholesterolemia, unspecified: Secondary | ICD-10-CM

## 2017-09-12 MED ORDER — FUROSEMIDE 40 MG PO TABS: 40.0000 mg | ORAL_TABLET | Freq: Every day | ORAL | 1 refills | Status: DC

## 2017-09-12 MED ORDER — COLCHICINE 0.6 MG PO TABS: 0.6000 mg | ORAL_TABLET | Freq: Two times a day (BID) | ORAL | 0 refills | Status: DC

## 2017-09-12 MED ORDER — POTASSIUM CHLORIDE CRYS ER 10 MEQ PO TBCR: EXTENDED_RELEASE_TABLET | ORAL | 1 refills | Status: DC

## 2017-09-12 MED ORDER — TRIAMTERENE-HCTZ 37.5-25 MG PO TABS: 1.0000 | ORAL_TABLET | Freq: Every day | ORAL | 0 refills | Status: DC

## 2017-09-12 NOTE — Progress Notes (Signed)
Patient ID: Lori Le, female   DOB: 03/24/1966, 52 y.o.   MRN: [id number]   Subjective:    Patient ID: Lori Le, female    DOB: 1966-02-14, 52 y.o.   MRN: [id number]  HPI  Patient here as a work in for ER follow up.  She was evaluated 09/05/17 in ER in [place] for increased swelling and edema.  Had lower extremity ultrasound - negative for DVT.  Lab work unrevealing.  She states that she had noticed increased swelling starting a few weeks prior to her ER visit.  No increased sob.  She increased her lasix.  Continued her triam/hctz.  Lost 20 pounds since increasing her lasix.  No chest pain.  No acid reflux.  No abdominal pain.  Bowels moving.  Feels better.  Legs are better.  Still some swelling, but better. Increased pain - great toe.  Has history of gout.  C/w gout flare.     Past Medical History:  Diagnosis Date  . Allergic rhinitis due to allergen   . Allergy   . Anemia   . Arthritis   . Cancer ([place])    skin ca  . Chronic idiopathic urticaria   . Complication of anesthesia    nausea, slow to wake up  . Depression   . GERD (gastroesophageal reflux disease)   . Gout   . Gout   . Hashimoto's thyroiditis   . Heart murmur   . History of eating disorder   . Hives   . Hyperlipidemia   . Hypertension   . Hypothyroidism   . Lower extremity edema   . Melanoma in situ ([place])    left shoulder  . Migraine   . Mitral valve disorder   . Motion sickness    all moving vehicles  . PCOS (polycystic ovarian syndrome)   . PONV (postoperative nausea and vomiting)   . Thyroid disease   . Ulcer   . Vertigo    last episode over 1 yr ago   Past Surgical History:  Procedure Laterality Date  . BREAST BIOPSY Right    neg- bx/clip  . CHOLECYSTECTOMY  2003  . DILITATION & CURRETTAGE/HYSTROSCOPY WITH NOVASURE ABLATION N/A 08/19/2016   Procedure: DILATATION & CURETTAGE/HYSTEROSCOPY WITH NOVASURE ABLATION;  Surgeon: [name], MD;  Location: [facility] ORS;  Service: Gynecology;   Laterality: N/A;  . ESOPHAGOGASTRODUODENOSCOPY (EGD) WITH PROPOFOL N/A 12/05/2015   Procedure: ESOPHAGOGASTRODUODENOSCOPY (EGD) WITH PROPOFOL;  Surgeon: Lollie Sails, MD;  Location: [facility] ENDOSCOPY;  Service: Endoscopy;  Laterality: N/A;  . ESOPHAGOGASTRODUODENOSCOPY (EGD) WITH PROPOFOL N/A 02/06/2016   Procedure: ESOPHAGOGASTRODUODENOSCOPY (EGD) WITH PROPOFOL;  Surgeon: Lollie Sails, MD;  Location: [facility] ENDOSCOPY;  Service: Endoscopy;  Laterality: N/A;  . FOOT SURGERY  1994  . GASTRIC BYPASS  2011  . JOINT REPLACEMENT Left 09/27/2014   HIP  . JOINT REPLACEMENT Left 12/01/2014   KNEE  . KNEE ARTHROSCOPY Right   . KNEE ARTHROSCOPY Left 03/23/2015   Procedure: ARTHROSCOPY KNEE, partial synovectomy;  Surgeon: Hessie Knows, MD;  Location: [facility] ORS;  Service: Orthopedics;  Laterality: Left;  . LAPAROSCOPIC GASTRIC BANDING  2010  . ROTATOR CUFF REPAIR Bilateral    right shoulder 07-26-2016  . SHOULDER ARTHROSCOPY Left 06/20/2015   Procedure: ARTHROSCOPY SHOULDER, REPAIR OF MASSIVE ROTATOR CUFF TEAR, TENODESIS, DECOMPRESSION DEBRIDEMENT;  Surgeon: Corky Mull, MD;  Location: [facility] ORS;  Service: Orthopedics;  Laterality: Left;  . TOTAL HIP ARTHROPLASTY Left 09/27/2014   Procedure: TOTAL HIP ARTHROPLASTY ANTERIOR APPROACH;  Surgeon:  Hessie Knows, MD;  Location: [facility] ORS;  Service: Orthopedics;  Laterality: Left;  . TOTAL KNEE ARTHROPLASTY Left 12/01/2014   Procedure: TOTAL KNEE ARTHROPLASTY;  Surgeon: Hessie Knows, MD;  Location: [facility] ORS;  Service: Orthopedics;  Laterality: Left;   Family History  Problem Relation Age of Onset  . Arthritis Mother   . Hyperlipidemia Mother   . Hypertension Mother   . Diabetes Mother   . Arthritis Father   . Hyperlipidemia Father   . Hypertension Father   . Mental illness Father   . Diabetes Father   . Arthritis Maternal Grandmother   . Cancer Maternal Grandmother        breast cancer  . Hyperlipidemia Maternal Grandmother   . Hypertension Maternal  Grandmother   . Breast cancer Maternal Grandmother   . Arthritis Maternal Grandfather   . Hyperlipidemia Maternal Grandfather   . Hypertension Maternal Grandfather   . Heart disease Maternal Grandfather        heart attack  . Breast cancer Paternal Grandmother    Social History   Socioeconomic History  . Marital status: Divorced    Spouse name: Not on file  . Number of children: Not on file  . Years of education: Not on file  . Highest education level: Not on file  Occupational History  . Not on file  Social Needs  . Financial resource strain: Not on file  . Food insecurity:    Worry: Not on file    Inability: Not on file  . Transportation needs:    Medical: Not on file    Non-medical: Not on file  Tobacco Use  . Smoking status: Never Smoker  . Smokeless tobacco: Never Used  Substance and Sexual Activity  . Alcohol use: Yes    Alcohol/week: 0.0 - 0.6 oz    Comment: occ.  . Drug use: No  . Sexual activity: Yes    Birth control/protection: Surgical    Comment: Ablation   Lifestyle  . Physical activity:    Days per week: Not on file    Minutes per session: Not on file  . Stress: Not on file  Relationships  . Social connections:    Talks on phone: Not on file    Gets together: Not on file    Attends religious service: Not on file    Active member of club or organization: Not on file    Attends meetings of clubs or organizations: Not on file    Relationship status: Not on file  Other Topics Concern  . Not on file  Social History Narrative  . Not on file    Outpatient Encounter Medications as of 09/12/2017  Medication Sig  . acetaminophen (TYLENOL) 650 MG CR tablet Take 1,300 mg by mouth 2 (two) times daily.  [name] allopurinol (ZYLOPRIM) 300 MG tablet TAKE 1 TABLET BY MOUTH DAILY  . azelastine (ASTELIN) 0.1 % nasal spray U 1 TO 2 SPRAYS IEN BID  . colchicine 0.6 MG tablet Take 1 tablet (0.6 mg total) by mouth 2 (two) times daily.  . DULoxetine (CYMBALTA) 60 MG  capsule Take 1 capsule (60 mg total) by mouth daily.  [name] DYMISTA 137-50 MCG/ACT SUSP SPRAY 1 SPRAY INTO EACH NOSTRIL BID  . furosemide (LASIX) 40 MG tablet Take 1 tablet (40 mg total) by mouth daily.  [name] levocetirizine (XYZAL) 5 MG tablet every evening.  [name] levothyroxine (SYNTHROID, LEVOTHROID) 137 MCG tablet TAKE ONE TABLET BY MOUTH ONCE DAILY BEFORE BREAKFAST (Patient taking differently: Take  137 mcg by mouth daily before breakfast. DAILY BEFORE BREAKFAST)  . montelukast (SINGULAIR) 10 MG tablet Take 10 mg by mouth daily.  . mupirocin ointment (BACTROBAN) 2 % Place 1 application into the nose 2 (two) times daily. (Patient not taking: Reported on 09/03/2017)  . pantoprazole (PROTONIX) 40 MG tablet TAKE 1 TABLET BY MOUTH EVERY DAY  . potassium chloride (K-DUR,KLOR-CON) 10 MEQ tablet TAKE 1 TABLET BY MOUTH daily  . triamterene-hydrochlorothiazide (MAXZIDE-25) 37.5-25 MG tablet Take 1 tablet by mouth daily.  . [DISCONTINUED] furosemide (LASIX) 40 MG tablet TAKE 1 TABLET (40 MG TOTAL) BY MOUTH DAILY.  . [DISCONTINUED] methylPREDNISolone (MEDROL DOSEPAK) 4 MG TBPK tablet TK UTD FOR 6 DAYS  . [DISCONTINUED] potassium chloride (K-DUR,KLOR-CON) 10 MEQ tablet TAKE 1 TABLET BY MOUTH TWICE DAILY FOR 3 DAYS THEN DAILY  . [DISCONTINUED] predniSONE (DELTASONE) 10 MG tablet Take three tablets for 2 days, then two tablets for 2 days, then one tablet for 2 days.  . [DISCONTINUED] triamterene-hydrochlorothiazide (MAXZIDE-25) 37.5-25 MG tablet TAKE 1 TABLET BY MOUTH EVERY DAY   Facility-Administered Encounter Medications as of 09/12/2017  Medication  . 0.9 %  sodium chloride infusion  . cyanocobalamin ((VITAMIN B-12)) injection 1,000 mcg    Review of Systems  Constitutional: Negative for appetite change and fever.  HENT: Negative for congestion and sinus pressure.   Respiratory: Negative for cough, chest tightness and shortness of breath.   Cardiovascular: Positive for leg swelling. Negative for chest pain and  palpitations.  Gastrointestinal: Negative for abdominal pain, diarrhea, nausea and vomiting.  Genitourinary: Negative for difficulty urinating and dysuria.  Musculoskeletal: Negative for joint swelling and myalgias.  Skin: Negative for color change and rash.  Neurological: Negative for dizziness, light-headedness and headaches.  Psychiatric/Behavioral: Negative for agitation and dysphoric mood.       Objective:    Physical Exam  Constitutional: She appears well-developed and well-nourished. No distress.  HENT:  Nose: Nose normal.  Mouth/Throat: Oropharynx is clear and moist.  Neck: Neck supple. No thyromegaly present.  Cardiovascular: Normal rate and regular rhythm.  Pulmonary/Chest: Breath sounds normal. No respiratory distress. She has no wheezes.  Abdominal: Soft. Bowel sounds are normal. There is no tenderness.  Musculoskeletal: She exhibits no tenderness.  Increased pedal and lower extremity swelling.  Increased erythema - base of great toe.  Increased pain to palpation.   Lymphadenopathy:    She has no cervical adenopathy.  Skin: No rash noted. No erythema.  Psychiatric: She has a normal mood and affect. Her behavior is normal.    BP 130/80 (BP Location: Left Arm, Patient Position: Sitting, Cuff Size: Large)   Pulse (!) 104   Temp 98.2 F (36.8 C) (Oral)   Resp 18   Wt 247 lb (112 kg)   SpO2 97%   BMI 38.69 kg/m  Wt Readings from Last 3 Encounters:  09/12/17 247 lb (112 kg)  09/03/17 254 lb 4 oz (115.3 kg)  07/25/17 249 lb 9.6 oz (113.2 kg)     Lab Results  Component Value Date   WBC 11.1 (H) 09/12/2017   HGB 14.7 09/12/2017   HCT 42.4 09/12/2017   PLT 324 09/12/2017   GLUCOSE 105 (H) 09/12/2017   CHOL 202 (H) 04/10/2017   TRIG 265 (H) 04/10/2017   HDL 67 04/10/2017   LDLDIRECT 121.0 11/16/2014   LDLCALC 82 04/10/2017   ALT 17 09/12/2017   AST 10 09/12/2017   NA 138 09/12/2017   K 3.3 (L) 09/12/2017   CL 91 (L)  09/12/2017   CREATININE 0.81  09/12/2017   BUN 18 09/12/2017   CO2 33 (H) 09/12/2017   TSH 0.54 12/26/2016   INR 0.99 03/08/2016   HGBA1C 5.5 04/10/2017    Mr Foot Right W Wo Contrast  Result Date: 05/24/2017 CLINICAL DATA:  History of stepping on glass in November 0034 complicated by infection, status post incision and drainage last week. EXAM: MRI OF THE RIGHT FOREFOOT WITHOUT AND WITH CONTRAST TECHNIQUE: Multiplanar, multisequence MR imaging of the right forefoot was performed before and after the administration of intravenous contrast. CONTRAST:  71m MULTIHANCE GADOBENATE DIMEGLUMINE 529 MG/ML IV SOLN COMPARISON:  Right foot x-rays dated February 24, 2017. FINDINGS: Bones/Joint/Cartilage No suspicious marrow signal abnormality. Small focus of degenerative marrow edema in the cuboid at the fourth tarsometatarsal joint. No fracture or dislocation. Normal alignment. No joint effusion. Susceptibility artifact related to cerclage wire in the first proximal phalanx. Ligaments Collateral ligaments are intact.  Lisfranc ligament is intact. Muscles and Tendons Flexor, peroneal and extensor compartment tendons are intact. Prominent increased T2 signal and enhancement of the intrinsic forefoot muscles surrounding the mid to distal third metatarsal. Soft tissue Small area of ulceration at the plantar base of the third metatarsal head. No fluid collection or hematoma. No soft tissue mass. IMPRESSION: 1. Prominent edema and enhancement of the intrinsic forefoot muscles surrounding the mid to distal third metatarsal, nonspecific, but suspicious for myositis given clinical history. No drainable fluid collection. 2. Small ulceration at the plantar base of the third metatarsal head may represent the site of recent incision and drainage. Correlate clinically. 3. No evidence of osteomyelitis. Electronically Signed   By: WTitus DubinM.D.   On: 05/24/2017 10:29       Assessment & Plan:   Problem List Items Addressed This Visit    Anemia     Has been evaluated by hematology.  Has previously received iron infusions.  Follow cbc.  hgb checked at outside hospital - 13.3.        Relevant Orders   CBC with Differential/Platelet (Completed)   Ferritin (Completed)   B12 deficiency    Continue B12 injections.        Depression    Stable on cymbalta.        Edema    Pedal and lower extremity swelling as outlined.  Improved.  On increased lasix.  Recheck metabolic panel.  Given persistent issues with lower extremity swelling, discussed compression hose.  Some intolerance.  Also discussed referral to vascular surgery referral.  She was in agreement.  Follow.        Relevant Orders   Hepatic function panel (Completed)   Basic metabolic panel (Completed)   Ambulatory referral to Vascular Surgery   Essential hypertension, benign    Blood pressure under good control.  Continue same medication regimen.  Follow pressures.  Follow metabolic panel.        Relevant Medications   furosemide (LASIX) 40 MG tablet   triamterene-hydrochlorothiazide (MAXZIDE-25) 37.5-25 MG tablet   Other Relevant Orders   Basic metabolic panel (Completed)   GERD (gastroesophageal reflux disease)    Doing well on current regimen.  Follow.        Gout    On allopurinol.  Recent flare.  Discussed triam/hctz, etc - possibly aggravating. Has tried off. Cannot tolerate.  Colchicine as directed.  Follow.        Hypercholesterolemia    Low cholesterol diet and exercise.  Follow lipid panel.  Relevant Medications   furosemide (LASIX) 40 MG tablet   triamterene-hydrochlorothiazide (MAXZIDE-25) 37.5-25 MG tablet   Hyperglycemia    Low carb diet and exercise.  Follow met b and a1c.        Hypothyroidism    On thyroid replacement.  Follow tsh.         Other Visit Diagnoses    Medication refill       Relevant Medications   triamterene-hydrochlorothiazide (MAXZIDE-25) 37.5-25 MG tablet       Einar Pheasant, MD

## 2017-09-13 LAB — BASIC METABOLIC PANEL
BUN: 18 mg/dL (ref 7–25)
CO2: 33 mmol/L — ABNORMAL HIGH (ref 20–32)
CREATININE: 0.81 mg/dL (ref 0.50–1.05)
Calcium: 9.6 mg/dL (ref 8.6–10.4)
Chloride: 91 mmol/L — ABNORMAL LOW (ref 98–110)
GLUCOSE: 105 mg/dL — AB (ref 65–99)
POTASSIUM: 3.3 mmol/L — AB (ref 3.5–5.3)
SODIUM: 138 mmol/L (ref 135–146)

## 2017-09-13 LAB — HEPATIC FUNCTION PANEL
AG Ratio: 1.6 (calc) (ref 1.0–2.5)
ALKALINE PHOSPHATASE (APISO): 57 U/L (ref 33–130)
ALT: 17 U/L (ref 6–29)
AST: 10 U/L (ref 10–35)
Albumin: 4.2 g/dL (ref 3.6–5.1)
Bilirubin, Direct: 0.1 mg/dL (ref 0.0–0.2)
GLOBULIN: 2.6 g/dL (ref 1.9–3.7)
Indirect Bilirubin: 0.2 mg/dL (calc) (ref 0.2–1.2)
TOTAL PROTEIN: 6.8 g/dL (ref 6.1–8.1)
Total Bilirubin: 0.3 mg/dL (ref 0.2–1.2)

## 2017-09-13 LAB — CBC WITH DIFFERENTIAL/PLATELET
BASOS PCT: 0.5 %
Basophils Absolute: 56 cells/uL (ref 0–200)
EOS PCT: 0.5 %
Eosinophils Absolute: 56 cells/uL (ref 15–500)
HCT: 42.4 % (ref 35.0–45.0)
Hemoglobin: 14.7 g/dL (ref 11.7–15.5)
Lymphs Abs: 1832 cells/uL (ref 850–3900)
MCH: 30.5 pg (ref 27.0–33.0)
MCHC: 34.7 g/dL (ref 32.0–36.0)
MCV: 88 fL (ref 80.0–100.0)
MONOS PCT: 9.1 %
MPV: 11 fL (ref 7.5–12.5)
Neutro Abs: 8147 cells/uL — ABNORMAL HIGH (ref 1500–7800)
Neutrophils Relative %: 73.4 %
PLATELETS: 324 10*3/uL (ref 140–400)
RBC: 4.82 10*6/uL (ref 3.80–5.10)
RDW: 12.8 % (ref 11.0–15.0)
TOTAL LYMPHOCYTE: 16.5 %
WBC mixed population: 1010 cells/uL — ABNORMAL HIGH (ref 200–950)
WBC: 11.1 10*3/uL — AB (ref 3.8–10.8)

## 2017-09-13 LAB — FERRITIN: FERRITIN: 135 ng/mL (ref 10–232)

## 2017-09-15 ENCOUNTER — Encounter: Payer: Self-pay | Admitting: Internal Medicine

## 2017-09-15 NOTE — Assessment & Plan Note (Signed)
Pedal and lower extremity swelling as outlined.  Improved.  On increased lasix.  Recheck metabolic panel.  Given persistent issues with lower extremity swelling, discussed compression hose.  Some intolerance.  Also discussed referral to vascular surgery referral.  She was in agreement.  Follow.

## 2017-09-15 NOTE — Assessment & Plan Note (Signed)
Low cholesterol diet and exercise.  Follow lipid panel.   

## 2017-09-15 NOTE — Assessment & Plan Note (Signed)
Low carb diet and exercise.  Follow met b and a1c.   

## 2017-09-15 NOTE — Assessment & Plan Note (Signed)
Doing well on current regimen.  Follow.   

## 2017-09-15 NOTE — Assessment & Plan Note (Signed)
Stable on cymbalta.  

## 2017-09-15 NOTE — Assessment & Plan Note (Signed)
Has been evaluated by hematology.  Has previously received iron infusions.  Follow cbc.  hgb checked at outside hospital - 13.3.

## 2017-09-15 NOTE — Assessment & Plan Note (Signed)
Blood pressure under good control.  Continue same medication regimen.  Follow pressures.  Follow metabolic panel.   

## 2017-09-15 NOTE — Assessment & Plan Note (Signed)
On thyroid replacement.  Follow tsh.  

## 2017-09-15 NOTE — Assessment & Plan Note (Signed)
Continue B12 injections.   

## 2017-09-15 NOTE — Assessment & Plan Note (Signed)
On allopurinol.  Recent flare.  Discussed triam/hctz, etc - possibly aggravating. Has tried off. Cannot tolerate.  Colchicine as directed.  Follow.

## 2017-09-16 ENCOUNTER — Encounter (HOSPITAL_COMMUNITY): Payer: Self-pay | Admitting: Psychiatry

## 2017-09-16 ENCOUNTER — Ambulatory Visit (INDEPENDENT_AMBULATORY_CARE_PROVIDER_SITE_OTHER): Payer: Managed Care, Other (non HMO) | Admitting: Psychiatry

## 2017-09-16 ENCOUNTER — Other Ambulatory Visit (HOSPITAL_COMMUNITY): Payer: Self-pay | Admitting: Psychiatry

## 2017-09-16 DIAGNOSIS — F331 Major depressive disorder, recurrent, moderate: Secondary | ICD-10-CM | POA: Diagnosis not present

## 2017-09-16 MED ORDER — DULOXETINE HCL 60 MG PO CPEP: 60.0000 mg | ORAL_CAPSULE | Freq: Every day | ORAL | 0 refills | Status: DC
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
## 2017-09-18 ENCOUNTER — Inpatient Hospital Stay: Payer: Managed Care, Other (non HMO) | Attending: Hematology and Oncology

## 2017-09-19 ENCOUNTER — Other Ambulatory Visit (HOSPITAL_COMMUNITY): Payer: Self-pay | Admitting: Psychiatry

## 2017-09-19 ENCOUNTER — Telehealth: Payer: Self-pay | Admitting: Internal Medicine

## 2017-09-19 NOTE — Telephone Encounter (Signed)
Please advise 

## 2017-09-19 NOTE — Telephone Encounter (Signed)
Copied from [place] #[id number]. Topic: Quick Communication - See Telephone Encounter >> Sep 19, 2017  4:03 PM Rutherford Nail, NT wrote: CRM for notification. See Telephone encounter for: 09/19/17. Patient is calling and states Dr Nicki Reaper had her increase her medication to 2x a day and that insurance will not fill her potassium chloride (K-DUR,KLOR-CON) 10 MEQ tablet  for another week or so. States that a new prescription will need to be sent with the correct dosage(2x a day). Please advise. CB#: [phone] [id number] - [place], [address] AT [address]

## 2017-09-22 ENCOUNTER — Other Ambulatory Visit: Payer: Self-pay | Admitting: Internal Medicine

## 2017-09-22 ENCOUNTER — Other Ambulatory Visit: Payer: Self-pay

## 2017-09-22 DIAGNOSIS — D72829 Elevated white blood cell count, unspecified: Secondary | ICD-10-CM

## 2017-09-22 DIAGNOSIS — E876 Hypokalemia: Secondary | ICD-10-CM

## 2017-09-22 MED ORDER — POTASSIUM CHLORIDE CRYS ER 10 MEQ PO TBCR: EXTENDED_RELEASE_TABLET | ORAL | 1 refills | Status: DC

## 2017-09-22 NOTE — Progress Notes (Signed)
Orders placed for labs

## 2017-09-22 NOTE — Telephone Encounter (Signed)
Left message for patient to let her know that she does need to come in for labs tomorrow.

## 2017-09-22 NOTE — Telephone Encounter (Signed)
Restart potassium and can keep recheck appt to confirm no lower.  Thanks

## 2017-09-22 NOTE — Telephone Encounter (Signed)
Sent in new rx. Patient stated she has been out for a couple days and would like to know if she should keep lab appt for tomorrow. Advised patient that she should still come have labs unless I call and let her know otherwise.

## 2017-09-23 ENCOUNTER — Other Ambulatory Visit (INDEPENDENT_AMBULATORY_CARE_PROVIDER_SITE_OTHER): Payer: Managed Care, Other (non HMO)

## 2017-09-23 DIAGNOSIS — E876 Hypokalemia: Secondary | ICD-10-CM | POA: Diagnosis not present

## 2017-09-23 DIAGNOSIS — D72829 Elevated white blood cell count, unspecified: Secondary | ICD-10-CM

## 2017-09-24 ENCOUNTER — Encounter (INDEPENDENT_AMBULATORY_CARE_PROVIDER_SITE_OTHER): Payer: Managed Care, Other (non HMO) | Admitting: Vascular Surgery

## 2017-09-24 LAB — CBC WITH DIFFERENTIAL/PLATELET
BASOS ABS: 0.1 10*3/uL (ref 0.0–0.1)
Basophils Relative: 0.7 % (ref 0.0–3.0)
EOS ABS: 0 10*3/uL (ref 0.0–0.7)
Eosinophils Relative: 0.1 % (ref 0.0–5.0)
HEMATOCRIT: 42.2 % (ref 36.0–46.0)
HEMOGLOBIN: 14 g/dL (ref 12.0–15.0)
Lymphocytes Relative: 11.5 % — ABNORMAL LOW (ref 12.0–46.0)
Lymphs Abs: 1.4 10*3/uL (ref 0.7–4.0)
MCHC: 33.2 g/dL (ref 30.0–36.0)
MCV: 92.1 fl (ref 78.0–100.0)
MONO ABS: 0.7 10*3/uL (ref 0.1–1.0)
MONOS PCT: 5.4 % (ref 3.0–12.0)
Neutro Abs: 10.1 10*3/uL — ABNORMAL HIGH (ref 1.4–7.7)
Neutrophils Relative %: 82.3 % — ABNORMAL HIGH (ref 43.0–77.0)
Platelets: 324 10*3/uL (ref 150.0–400.0)
RBC: 4.58 Mil/uL (ref 3.87–5.11)
RDW: 13.7 % (ref 11.5–15.5)
WBC: 12.2 10*3/uL — ABNORMAL HIGH (ref 4.0–10.5)

## 2017-09-24 LAB — BASIC METABOLIC PANEL
BUN: 21 mg/dL (ref 6–23)
CO2: 29 meq/L (ref 19–32)
Calcium: 9.3 mg/dL (ref 8.4–10.5)
Chloride: 97 mEq/L (ref 96–112)
Creatinine, Ser: 0.67 mg/dL (ref 0.40–1.20)
GFR: 98.36 mL/min (ref >60.00)
GLUCOSE: 155 mg/dL — AB (ref 70–99)
POTASSIUM: 3.5 meq/L (ref 3.5–5.1)
SODIUM: 138 meq/L (ref 135–145)

## 2017-09-25 ENCOUNTER — Telehealth: Payer: Self-pay | Admitting: Internal Medicine

## 2017-09-25 ENCOUNTER — Other Ambulatory Visit: Payer: Self-pay | Admitting: Internal Medicine

## 2017-09-25 DIAGNOSIS — E876 Hypokalemia: Secondary | ICD-10-CM

## 2017-09-25 DIAGNOSIS — D72829 Elevated white blood cell count, unspecified: Secondary | ICD-10-CM

## 2017-09-25 NOTE — Telephone Encounter (Signed)
Copied from [place] [phone]. Topic: Quick Communication - Office Called Patient >> Sep 25, 2017  3:37 PM Cecelia Byars, [place] wrote: Reason for CRM: Patient returned call from [facility] and said she call her back at work at [phone]

## 2017-09-25 NOTE — Telephone Encounter (Signed)
Patient called back

## 2017-09-25 NOTE — Progress Notes (Signed)
Orders placed for f/u labs.  

## 2017-09-29 ENCOUNTER — Encounter (INDEPENDENT_AMBULATORY_CARE_PROVIDER_SITE_OTHER): Payer: Managed Care, Other (non HMO) | Admitting: Vascular Surgery

## 2017-09-30 NOTE — Telephone Encounter (Signed)
Called patient to give lab results, ok for P[facility] to speak with patient,.

## 2017-10-03 NOTE — Telephone Encounter (Signed)
Patient aware of results.

## 2017-10-07 ENCOUNTER — Ambulatory Visit (INDEPENDENT_AMBULATORY_CARE_PROVIDER_SITE_OTHER): Payer: Managed Care, Other (non HMO) | Admitting: Vascular Surgery

## 2017-10-07 ENCOUNTER — Encounter (INDEPENDENT_AMBULATORY_CARE_PROVIDER_SITE_OTHER): Payer: Self-pay | Admitting: Vascular Surgery

## 2017-10-07 VITALS — BP 122/74 | HR 85 | Resp 15 | Ht 68.0 in | Wt 251.0 lb

## 2017-10-07 DIAGNOSIS — R600 Localized edema: Secondary | ICD-10-CM

## 2017-10-07 NOTE — Progress Notes (Signed)
Subjective:    Patient ID: Lori Le, female    DOB: Nov 10, 1965, 52 y.o.   MRN: [id number] Chief Complaint  Patient presents with  . New Patient (Initial Visit)    Bilateral LE edema   Presents as a new patient referred by Dr. Nicki Reaper for evaluation of bilateral lower extremity edema.  The patient notes a long-standing history of swelling to the bilateral legs.  The patient notes that her swelling is "cyclic".  The patient will experience swelling for a few weeks and it will resolve and will return once again.  A couple weeks ago the patient notes that her swelling was so bad that she was "forced to seek treatment at the emergency department" in [place].  The patient notes that she wears medical grade 1 compression socks and elevates her legs on a daily basis with no improvement in her symptoms.  The edema is associated with discomfort.  The patient feels that her symptoms have progressed to the point that she is unable to function on a daily basis and they have become lifestyle limiting.  The patient has had a total knee and hip replacement to the left lower extremity.  The patient denies any DVT history of the bilateral lower extremity.  The patient denies any claudication-like symptoms, rest pain or ulceration to the bilateral lower extremity.  The patient denies any recurrence bouts of cellulitis.  The patient denies any fever, nausea vomiting.  Review of Systems  Constitutional: Negative.   HENT: Negative.   Eyes: Negative.   Respiratory: Negative.   Cardiovascular: Positive for leg swelling.  Gastrointestinal: Negative.   Endocrine: Negative.   Genitourinary: Negative.   Musculoskeletal: Negative.   Skin: Negative.   Allergic/Immunologic: Negative.   Neurological: Negative.   Hematological: Negative.   Psychiatric/Behavioral: Negative.       Objective:   Physical Exam  Constitutional: She is oriented to person, place, and time. She appears well-developed and well-nourished. No  distress.  HENT:  Head: Normocephalic and atraumatic.  Right Ear: External ear normal.  Left Ear: External ear normal.  Eyes: Pupils are equal, round, and reactive to light. Conjunctivae and EOM are normal.  Neck: Normal range of motion.  Cardiovascular: Normal rate, regular rhythm and normal heart sounds.  Pulmonary/Chest: Effort normal and breath sounds normal.  Musculoskeletal: Normal range of motion. She exhibits edema (Moderate nonpitting edema noted bilaterally).  Neurological: She is alert and oriented to person, place, and time.  Skin: Skin is warm and dry. She is not diaphoretic.  Moderate stasis dermatitis noted bilaterally.  There is no fibrosis, cellulitis, ulcerations noted to the bilateral lower extremity.  Less than 1 cm scattered varicosities noted bilaterally.  Psychiatric: She has a normal mood and affect. Her behavior is normal. Judgment and thought content normal.  Vitals reviewed.  BP 122/74 (BP Location: Right Arm, Patient Position: Sitting)   Pulse 85   Resp 15   Ht 5\' 8"  (1.727 m)   Wt 251 lb (113.9 kg)   BMI 38.16 kg/m   Past Medical History:  Diagnosis Date  . Allergic rhinitis due to allergen   . Allergy   . Anemia   . Arthritis   . Cancer ([place])    skin ca  . Chronic idiopathic urticaria   . Complication of anesthesia    nausea, slow to wake up  . Depression   . GERD (gastroesophageal reflux disease)   . Gout   . Gout   . Hashimoto's thyroiditis   .  Heart murmur   . History of eating disorder   . Hives   . Hyperlipidemia   . Hypertension   . Hypothyroidism   . Lower extremity edema   . Melanoma in situ ([place])    left shoulder  . Migraine   . Mitral valve disorder   . Motion sickness    all moving vehicles  . PCOS (polycystic ovarian syndrome)   . PONV (postoperative nausea and vomiting)   . Thyroid disease   . Ulcer   . Vertigo    last episode over 1 yr ago   Social History   Socioeconomic History  . Marital status: Divorced     Spouse name: Not on file  . Number of children: Not on file  . Years of education: Not on file  . Highest education level: Not on file  Occupational History  . Not on file  Social Needs  . Financial resource strain: Not on file  . Food insecurity:    Worry: Not on file    Inability: Not on file  . Transportation needs:    Medical: Not on file    Non-medical: Not on file  Tobacco Use  . Smoking status: Never Smoker  . Smokeless tobacco: Never Used  Substance and Sexual Activity  . Alcohol use: Yes    Alcohol/week: 0.0 - 0.6 oz    Comment: occ.  . Drug use: No  . Sexual activity: Yes    Partners: Male    Birth control/protection: Surgical    Comment: Ablation   Lifestyle  . Physical activity:    Days per week: Not on file    Minutes per session: Not on file  . Stress: Not on file  Relationships  . Social connections:    Talks on phone: Not on file    Gets together: Not on file    Attends religious service: Not on file    Active member of club or organization: Not on file    Attends meetings of clubs or organizations: Not on file    Relationship status: Not on file  . Intimate partner violence:    Fear of current or ex partner: Not on file    Emotionally abused: Not on file    Physically abused: Not on file    Forced sexual activity: Not on file  Other Topics Concern  . Not on file  Social History Narrative  . Not on file   Past Surgical History:  Procedure Laterality Date  . BREAST BIOPSY Right    neg- bx/clip  . CHOLECYSTECTOMY  2003  . DILITATION & CURRETTAGE/HYSTROSCOPY WITH NOVASURE ABLATION N/A 08/19/2016   Procedure: DILATATION & CURETTAGE/HYSTEROSCOPY WITH NOVASURE ABLATION;  Surgeon: [name], MD;  Location: [facility] ORS;  Service: Gynecology;  Laterality: N/A;  . ESOPHAGOGASTRODUODENOSCOPY (EGD) WITH PROPOFOL N/A 12/05/2015   Procedure: ESOPHAGOGASTRODUODENOSCOPY (EGD) WITH PROPOFOL;  Surgeon: Lollie Sails, MD;  Location[facility] ENDOSCOPY;  Service:  Endoscopy;  Laterality: N/A;  . ESOPHAGOGASTRODUODENOSCOPY (EGD) WITH PROPOFOL N/A 02/06/2016   Procedure: ESOPHAGOGASTRODUODENOSCOPY (EGD) WITH PROPOFOL;  Surgeon: Lollie Sails, MD;  Location: [facility] ENDOSCOPY;  Service: Endoscopy;  Laterality: N/A;  . FOOT SURGERY  1994  . GASTRIC BYPASS  2011  . JOINT REPLACEMENT Left 09/27/2014   HIP  . JOINT REPLACEMENT Left 12/01/2014   KNEE  . KNEE ARTHROSCOPY Right   . KNEE ARTHROSCOPY Left 03/23/2015   Procedure: ARTHROSCOPY KNEE, partial synovectomy;  Surgeon: Hessie Knows, MD;  Location: [facility] ORS;  Service:  Orthopedics;  Laterality: Left;  . LAPAROSCOPIC GASTRIC BANDING  2010  . ROTATOR CUFF REPAIR Bilateral    right shoulder 07-26-2016  . SHOULDER ARTHROSCOPY Left 06/20/2015   Procedure: ARTHROSCOPY SHOULDER, REPAIR OF MASSIVE ROTATOR CUFF TEAR, TENODESIS, DECOMPRESSION DEBRIDEMENT;  Surgeon: Corky Mull, MD;  Location: [facility] ORS;  Service: Orthopedics;  Laterality: Left;  . TOTAL HIP ARTHROPLASTY Left 09/27/2014   Procedure: TOTAL HIP ARTHROPLASTY ANTERIOR APPROACH;  Surgeon: Hessie Knows, MD;  Location: [facility] ORS;  Service: Orthopedics;  Laterality: Left;  . TOTAL KNEE ARTHROPLASTY Left 12/01/2014   Procedure: TOTAL KNEE ARTHROPLASTY;  Surgeon: Hessie Knows, MD;  Location: [facility] ORS;  Service: Orthopedics;  Laterality: Left;   Family History  Problem Relation Age of Onset  . Arthritis Mother   . Hyperlipidemia Mother   . Hypertension Mother   . Diabetes Mother   . Arthritis Father   . Hyperlipidemia Father   . Hypertension Father   . Mental illness Father   . Diabetes Father   . Arthritis Maternal Grandmother   . Cancer Maternal Grandmother        breast cancer  . Hyperlipidemia Maternal Grandmother   . Hypertension Maternal Grandmother   . Breast cancer Maternal Grandmother   . Arthritis Maternal Grandfather   . Hyperlipidemia Maternal Grandfather   . Hypertension Maternal Grandfather   . Heart disease Maternal Grandfather         heart attack  . Breast cancer Paternal Grandmother    Allergies  Allergen Reactions  . Etodolac Anaphylaxis  . Influenza Vaccines Hives  . Lactose Nausea And Vomiting    Can have yogurt Can not have milk and ice cream   . Lactose Intolerance (Gi) Nausea And Vomiting    Can have yogurt Can not have milk and ice cream    . Bacitracin-Neomycin-Polymyxin Rash  . Contrast Media [Iodinated Diagnostic Agents] Rash    MRI dye  . Neomycin-Bacitracin Zn-Polymyx Rash  . Neosporin + Pain Relief Max St [Neomy-Bacit-Polymyx-Pramoxine] Rash      Assessment & Plan:  Presents as a new patient referred by Dr. Nicki Reaper for evaluation of bilateral lower extremity edema.  The patient notes a long-standing history of swelling to the bilateral legs.  The patient notes that her swelling is "cyclic".  The patient will experience swelling for a few weeks and it will resolve and will return once again.  A couple weeks ago the patient notes that her swelling was so bad that she was "forced to seek treatment at the emergency department" in [place].  The patient notes that she wears medical grade 1 compression socks and elevates her legs on a daily basis with no improvement in her symptoms.  The edema is associated with discomfort.  The patient feels that her symptoms have progressed to the point that she is unable to function on a daily basis and they have become lifestyle limiting.  The patient has had a total knee and hip replacement to the left lower extremity.  The patient denies any DVT history of the bilateral lower extremity.  The patient denies any claudication-like symptoms, rest pain or ulceration to the bilateral lower extremity.  The patient denies any recurrence bouts of cellulitis.  The patient denies any fever, nausea vomiting.  1. Bilateral lower extremity edema - New Since the patient's lower extremity is so edematous I recommended bilateral 3 layer zinc oxide [place] wraps to gain control of the edema to  make an easier transition into compression socks. This time,  the patient would like to continue wearing her own medical grade 1 compression socks and elevating her legs on a daily basis If the patient changed her mind she is to call the office and be happy to place her in [place] wraps In addition, behavioral modification including elevation during the day will be initiated. Anti-inflammatories for pain. I will bring the patient back in 3 months to undergo a bilateral lower extremity venous duplex to rule out any contributing venous versus lymphatic disease The patient will follow up in three months to asses conservative management.  Information on compression stockings was given to the patient. The patient was instructed to call the office in the interim if any worsening edema or ulcerations to the legs, feet or toes occurs. The patient expresses their understanding.  - VAS [name] LOWER EXTREMITY VENOUS REFLUX; Future  Current Outpatient Medications on File Prior to Visit  Medication Sig Dispense Refill  . acetaminophen (TYLENOL) 650 MG CR tablet Take 1,300 mg by mouth 2 (two) times daily.    [name] allopurinol (ZYLOPRIM) 300 MG tablet TAKE 1 TABLET BY MOUTH DAILY 90 tablet 2  . azelastine (ASTELIN) 0.1 % nasal spray U 1 TO 2 SPRAYS IEN BID  6  . colchicine 0.6 MG tablet Take 1 tablet (0.6 mg total) by mouth 2 (two) times daily. 40 tablet 0  . DULoxetine (CYMBALTA) 60 MG capsule Take 1 capsule (60 mg total) by mouth daily. 90 capsule 0  . DYMISTA 137-50 MCG/ACT SUSP SPRAY 1 SPRAY INTO EACH NOSTRIL BID  2  . furosemide (LASIX) 40 MG tablet Take 1 tablet (40 mg total) by mouth daily. 90 tablet 1  . levocetirizine (XYZAL) 5 MG tablet every evening.  3  . levothyroxine (SYNTHROID, LEVOTHROID) 137 MCG tablet TAKE ONE TABLET BY MOUTH ONCE DAILY BEFORE BREAKFAST (Patient taking differently: Take 137 mcg by mouth daily before breakfast. DAILY BEFORE BREAKFAST) 90 tablet 0  . montelukast (SINGULAIR) 10 MG tablet  Take 10 mg by mouth daily.  3  . pantoprazole (PROTONIX) 40 MG tablet TAKE 1 TABLET BY MOUTH EVERY DAY 90 tablet 1  . potassium chloride (K-DUR,KLOR-CON) 10 MEQ tablet TAKE 1 TABLET BY MOUTH twice daily 180 tablet 1  . triamterene-hydrochlorothiazide (MAXZIDE-25) 37.5-25 MG tablet Take 1 tablet by mouth daily. 90 tablet 0  . mupirocin ointment (BACTROBAN) 2 % Place 1 application into the nose 2 (two) times daily. (Patient not taking: Reported on 09/03/2017) 22 g 0   Current Facility-Administered Medications on File Prior to Visit  Medication Dose Route Frequency Provider Last Rate Last Dose  . 0.9 %  sodium chloride infusion   Intravenous Continuous Lequita Asal, MD   Stopped at 05/08/17 1437  . cyanocobalamin ((VITAMIN B-12)) injection 1,000 mcg  1,000 mcg Intramuscular Once Lequita Asal, MD       There are no Patient Instructions on file for this visit. No follow-ups on file.  Linlee Cromie A Josyah Achor, PA-C

## 2017-10-10 ENCOUNTER — Other Ambulatory Visit: Payer: Self-pay

## 2017-10-10 NOTE — Addendum Note (Signed)
Addended by: Arby Barrette on: 10/10/2017 09:26 AM   Modules accepted: Orders

## 2017-10-16 ENCOUNTER — Inpatient Hospital Stay: Payer: Managed Care, Other (non HMO) | Attending: Hematology and Oncology

## 2017-10-16 DIAGNOSIS — K5900 Constipation, unspecified: Secondary | ICD-10-CM | POA: Insufficient documentation

## 2017-10-16 DIAGNOSIS — D500 Iron deficiency anemia secondary to blood loss (chronic): Secondary | ICD-10-CM

## 2017-10-16 DIAGNOSIS — D509 Iron deficiency anemia, unspecified: Secondary | ICD-10-CM | POA: Insufficient documentation

## 2017-10-16 DIAGNOSIS — Z803 Family history of malignant neoplasm of breast: Secondary | ICD-10-CM | POA: Diagnosis not present

## 2017-10-16 DIAGNOSIS — E538 Deficiency of other specified B group vitamins: Secondary | ICD-10-CM | POA: Diagnosis present

## 2017-10-16 DIAGNOSIS — R5383 Other fatigue: Secondary | ICD-10-CM | POA: Insufficient documentation

## 2017-10-16 DIAGNOSIS — R609 Edema, unspecified: Secondary | ICD-10-CM | POA: Insufficient documentation

## 2017-10-16 DIAGNOSIS — Z9884 Bariatric surgery status: Secondary | ICD-10-CM | POA: Insufficient documentation

## 2017-10-16 MED ORDER — CYANOCOBALAMIN 1000 MCG/ML IJ SOLN
1000.0000 ug | Freq: Once | INTRAMUSCULAR | Status: AC
  Administered 2017-10-16: 1000 ug via INTRAMUSCULAR

## 2017-10-17 ENCOUNTER — Other Ambulatory Visit: Payer: Self-pay | Admitting: Internal Medicine

## 2017-10-17 DIAGNOSIS — Z760 Encounter for issue of repeat prescription: Secondary | ICD-10-CM

## 2017-10-27 ENCOUNTER — Inpatient Hospital Stay: Payer: Self-pay

## 2017-10-27 ENCOUNTER — Ambulatory Visit: Payer: Self-pay | Admitting: Urgent Care

## 2017-10-27 ENCOUNTER — Inpatient Hospital Stay: Payer: Managed Care, Other (non HMO)

## 2017-10-27 ENCOUNTER — Other Ambulatory Visit: Payer: Self-pay

## 2017-10-27 DIAGNOSIS — D649 Anemia, unspecified: Secondary | ICD-10-CM

## 2017-10-27 DIAGNOSIS — E538 Deficiency of other specified B group vitamins: Secondary | ICD-10-CM | POA: Diagnosis not present

## 2017-10-27 LAB — CBC WITH DIFFERENTIAL/PLATELET
Basophils Absolute: 0.1 10*3/uL (ref 0–0.1)
Basophils Relative: 1 %
EOS PCT: 2 %
Eosinophils Absolute: 0.1 10*3/uL (ref 0–0.7)
HEMATOCRIT: 38.6 % (ref 35.0–47.0)
Hemoglobin: 13.1 g/dL (ref 12.0–16.0)
LYMPHS ABS: 1.9 10*3/uL (ref 1.0–3.6)
LYMPHS PCT: 25 %
MCH: 31.1 pg (ref 26.0–34.0)
MCHC: 34 g/dL (ref 32.0–36.0)
MCV: 91.5 fL (ref 80.0–100.0)
Monocytes Absolute: 0.7 10*3/uL (ref 0.2–0.9)
Monocytes Relative: 9 %
NEUTROS ABS: 5.1 10*3/uL (ref 1.4–6.5)
Neutrophils Relative %: 63 %
PLATELETS: 253 10*3/uL (ref 150–440)
RBC: 4.22 MIL/uL (ref 3.80–5.20)
RDW: 13.6 % (ref 11.5–14.5)
WBC: 7.9 10*3/uL (ref 3.6–11.0)

## 2017-10-27 LAB — IRON AND TIBC
IRON: 69 ug/dL (ref 28–170)
Saturation Ratios: 20 % (ref 10.4–31.8)
TIBC: 343 ug/dL (ref 250–450)
UIBC: 274 ug/dL

## 2017-10-27 LAB — FERRITIN: Ferritin: 48 ng/mL (ref 11–307)

## 2017-10-28 ENCOUNTER — Inpatient Hospital Stay: Payer: Managed Care, Other (non HMO)

## 2017-10-28 ENCOUNTER — Encounter: Payer: Self-pay | Admitting: Hematology and Oncology

## 2017-10-28 ENCOUNTER — Inpatient Hospital Stay (HOSPITAL_BASED_OUTPATIENT_CLINIC_OR_DEPARTMENT_OTHER): Payer: Managed Care, Other (non HMO) | Admitting: Hematology and Oncology

## 2017-10-28 VITALS — BP 143/83 | HR 79 | Temp 98.1°F | Resp 18 | Wt 253.3 lb

## 2017-10-28 DIAGNOSIS — R609 Edema, unspecified: Secondary | ICD-10-CM | POA: Diagnosis not present

## 2017-10-28 DIAGNOSIS — E538 Deficiency of other specified B group vitamins: Secondary | ICD-10-CM

## 2017-10-28 DIAGNOSIS — D508 Other iron deficiency anemias: Secondary | ICD-10-CM | POA: Insufficient documentation

## 2017-10-28 DIAGNOSIS — Z9884 Bariatric surgery status: Secondary | ICD-10-CM

## 2017-10-28 DIAGNOSIS — K5900 Constipation, unspecified: Secondary | ICD-10-CM

## 2017-10-28 DIAGNOSIS — R5383 Other fatigue: Secondary | ICD-10-CM

## 2017-10-28 DIAGNOSIS — D509 Iron deficiency anemia, unspecified: Secondary | ICD-10-CM

## 2017-10-28 DIAGNOSIS — Z803 Family history of malignant neoplasm of breast: Secondary | ICD-10-CM

## 2017-10-28 DIAGNOSIS — K9589 Other complications of other bariatric procedure: Secondary | ICD-10-CM | POA: Insufficient documentation

## 2017-10-28 MED ORDER — CYANOCOBALAMIN 1000 MCG/ML IJ SOLN: 1000.0000 ug | INTRAMUSCULAR | 5 refills | Status: DC

## 2017-10-28 MED ORDER — "SYRINGE/NEEDLE (DISP) 23G X 1"" 3 ML MISC": 0 refills | Status: DC

## 2017-10-28 NOTE — Progress Notes (Signed)
[facility] day:  10/28/2017  Chief Complaint: Lori Le is a 52 y.o. female s/p gastric bypass surgery and subsequent iron deficiency and B12 deficiency who is seen for 6 month assessment.  HPI:  The patient was last seen in the hematology clinic on 04/28/2017.  At that time, she felt "ok".  She noted some blood in her stool.  Exam revealed stable bilateral lower extremity edema (left > right).  Hematocrit was 39.2.  Ferritin was 20.  She received Venofer weekly x 2 (05/01/2017 - 05/08/2017) and x 2 (08/04/2017 - 08/13/2017).  She has received B12 periodically (04/28/2017, 07/24/2017, 08/13/2017, and 10/16/2017).  Labs have been followed: 07/28/2017:  Hematocrit 38.8, hemoglobin 13.2, and MCV 89.5.  Ferritin 18. 09/23/2017:  Hematocrit 42.2, hemoglobin 14.0, and MCV 92.1.  Ferritin 135 on 09/12/2017. 10/27/2017:  Hematocrit 38.6, hemoglobin 13.1, and MCV 91.5.  Ferritin 48.  Iron saturation was 20% with a TIBC of 343.  Colonoscopy was cancelled x 2 (05/23/2017 and 07/11/2017). The first time the prep was not adequate, therefore the procedure was aborted. The rescheduled procedure was subsequently cancelled due to the death of her father. Patient has been advised that she will require a 2 day prep.   During the interim, patient has been doing ok. She notes that her edema is "horrible". Patient states, "even with the diuretics I cant get rid of it". Patient notes recurrent gout flares every 2 weeks. She is fatigued during the day, but notes that she sleeps well night. Patient continues to be constipated. She has areas of unexplained bruising (does not take ASA or NSAIDs).   Patient denies that she has experienced any B symptoms. She denies any interval infections. Patient advises that she maintains an adequate appetite. She is eating well. Weight today is 253 lb 5 oz (114.9 kg), which compared to her last visit to the clinic, represents a 16 pound  increase, which she attributes to fluid retention.   Patient denies pain in the clinic today.   Past Medical History:  Diagnosis Date  . Allergic rhinitis due to allergen   . Allergy   . Anemia   . Arthritis   . Cancer ([place])    skin ca  . Chronic idiopathic urticaria   . Complication of anesthesia    nausea, slow to wake up  . Depression   . GERD (gastroesophageal reflux disease)   . Gout   . Gout   . Hashimoto's thyroiditis   . Heart murmur   . History of eating disorder   . Hives   . Hyperlipidemia   . Hypertension   . Hypothyroidism   . Lower extremity edema   . Melanoma in situ ([place])    left shoulder  . Migraine   . Mitral valve disorder   . Motion sickness    all moving vehicles  . PCOS (polycystic ovarian syndrome)   . PONV (postoperative nausea and vomiting)   . Thyroid disease   . Ulcer   . Vertigo    last episode over 1 yr ago    Past Surgical History:  Procedure Laterality Date  . BREAST BIOPSY Right    neg- bx/clip  . CHOLECYSTECTOMY  2003  . DILITATION & CURRETTAGE/HYSTROSCOPY WITH NOVASURE ABLATION N/A 08/19/2016   Procedure: DILATATION & CURETTAGE/HYSTEROSCOPY WITH NOVASURE ABLATION;  Surgeon: [name], MD;  Location: [facility] ORS;  Service: Gynecology;  Laterality: N/A;  . ESOPHAGOGASTRODUODENOSCOPY (EGD) WITH PROPOFOL N/A 12/05/2015  Procedure: ESOPHAGOGASTRODUODENOSCOPY (EGD) WITH PROPOFOL;  Surgeon: Lollie Sails, MD;  Location: [facility] ENDOSCOPY;  Service: Endoscopy;  Laterality: N/A;  . ESOPHAGOGASTRODUODENOSCOPY (EGD) WITH PROPOFOL N/A 02/06/2016   Procedure: ESOPHAGOGASTRODUODENOSCOPY (EGD) WITH PROPOFOL;  Surgeon: Lollie Sails, MD;  Location: [facility] END[facility]COPY;  Service: Endoscopy;  Laterality: N/A;  . FOOT SURGERY  1994  . GASTRIC BYPASS  2011  . JOINT REPLACEMENT Left 09/27/2014   HIP  . JOINT REPLACEMENT Left 12/01/2014   KNEE  . KNEE ARTHROSCOPY Right   . KNEE ARTHROSCOPY Left 03/23/2015   Procedure: ARTHROSCOPY KNEE, partial  synovectomy;  Surgeon: Hessie Knows, MD;  Location: [facility] ORS;  Service: Orthopedics;  Laterality: Left;  . LAPAROSCOPIC GASTRIC BANDING  2010  . ROTATOR CUFF REPAIR Bilateral    right shoulder 07-26-2016  . SHOULDER ARTHROSCOPY Left 06/20/2015   Procedure: ARTHROSCOPY SHOULDER, REPAIR OF MASSIVE ROTATOR CUFF TEAR, TENODESIS, DECOMPRESSION DEBRIDEMENT;  Surgeon: Corky Mull, MD;  Location: [facility] ORS;  Service: Orthopedics;  Laterality: Left;  . TOTAL HIP ARTHROPLASTY Left 09/27/2014   Procedure: TOTAL HIP ARTHROPLASTY ANTERIOR APPROACH;  Surgeon: Hessie Knows, MD;  Location: [facility] ORS;  Service: Orthopedics;  Laterality: Left;  . TOTAL KNEE ARTHROPLASTY Left 12/01/2014   Procedure: TOTAL KNEE ARTHROPLASTY;  Surgeon: Hessie Knows, MD;  Location: [facility] ORS;  Service: Orthopedics;  Laterality: Left;    Family History  Problem Relation Age of Onset  . Arthritis Mother   . Hyperlipidemia Mother   . Hypertension Mother   . Diabetes Mother   . Arthritis Father   . Hyperlipidemia Father   . Hypertension Father   . Mental illness Father   . Diabetes Father   . Arthritis Maternal Grandmother   . Cancer Maternal Grandmother        breast cancer  . Hyperlipidemia Maternal Grandmother   . Hypertension Maternal Grandmother   . Breast cancer Maternal Grandmother   . Arthritis Maternal Grandfather   . Hyperlipidemia Maternal Grandfather   . Hypertension Maternal Grandfather   . Heart disease Maternal Grandfather        heart attack  . Breast cancer Paternal Grandmother     Social History:  reports that she has never smoked. She has never used smokeless tobacco. She reports that she drinks alcohol. She reports that she does not use drugs.  The patient lives in [place].  The patient is alone today.  Allergies:  Allergies  Allergen Reactions  . Etodolac Anaphylaxis  . Influenza Vaccines Hives  . Lactose Nausea And Vomiting    Can have yogurt Can not have milk and ice cream   . Lactose Intolerance  (Gi) Nausea And Vomiting    Can have yogurt Can not have milk and ice cream    . Bacitracin-Neomycin-Polymyxin Rash  . Contrast Media [Iodinated Diagnostic Agents] Rash    MRI dye  . Neomycin-Bacitracin Zn-Polymyx Rash  . Neosporin + Pain Relief Max St [Neomy-Bacit-Polymyx-Pramoxine] Rash    Current Medications: Current Outpatient Medications  Medication Sig Dispense Refill  . acetaminophen (TYLENOL) 650 MG CR tablet Take 1,300 mg by mouth 2 (two) times daily.    [name] allopurinol (ZYLOPRIM) 300 MG tablet TAKE 1 TABLET BY MOUTH DAILY 90 tablet 2  . azelastine (ASTELIN) 0.1 % nasal spray U 1 TO 2 SPRAYS IEN BID  6  . colchicine 0.6 MG tablet Take 1 tablet (0.6 mg total) by mouth 2 (two) times daily. 40 tablet 0  . DULoxetine (CYMBALTA) 60 MG capsule Take 1 capsule (60 mg  total) by mouth daily. 90 capsule 0  . DYMISTA 137-50 MCG/ACT SUSP SPRAY 1 SPRAY INTO EACH NOSTRIL BID  2  . furosemide (LASIX) 40 MG tablet Take 1 tablet (40 mg total) by mouth daily. 90 tablet 1  . levocetirizine (XYZAL) 5 MG tablet every evening.  3  . levothyroxine (SYNTHROID, LEVOTHROID) 137 MCG tablet TAKE ONE TABLET BY MOUTH ONCE DAILY BEFORE BREAKFAST (Patient taking differently: Take 137 mcg by mouth daily before breakfast. DAILY BEFORE BREAKFAST) 90 tablet 0  . montelukast (SINGULAIR) 10 MG tablet Take 10 mg by mouth daily.  3  . pantoprazole (PROTONIX) 40 MG tablet TAKE 1 TABLET BY MOUTH EVERY DAY 90 tablet 1  . potassium chloride (K-DUR,KLOR-CON) 10 MEQ tablet TAKE 1 TABLET BY MOUTH twice daily 180 tablet 1  . triamterene-hydrochlorothiazide (MAXZIDE-25) 37.5-25 MG tablet Take 1 tablet by mouth daily. 90 tablet 0  . mupirocin ointment (BACTROBAN) 2 % Place 1 application into the nose 2 (two) times daily. (Patient not taking: Reported on 09/03/2017) 22 g 0   No current facility-administered medications for this visit.    Facility-Administered Medications Ordered in Other Visits  Medication Dose Route Frequency  Provider Last Rate Last Dose  . 0.9 %  sodium chloride infusion   Intravenous Continuous Lequita Asal, MD   Stopped at 05/08/17 1437  . cyanocobalamin ((VITAMIN B-12)) injection 1,000 mcg  1,000 mcg Intramuscular Once Lequita Asal, MD        Review of Systems:  GENERAL:  Feels "ok".  No fevers, sweats.  Weight gain of 16 pounds since last visit.[name] PERFORMANCE STATUS (ECOG):  1 HEENT:  No visual changes, runny nose, sore throat, mouth sores or tenderness. Lungs: No shortness of breath or cough.  No hemoptysis. Cardiac:  No chest pain, palpitations, orthopnea, or PND. GI:  No nausea, vomiting, diarrhea, constipation, melena or hematochezia. h/o blood in stool. GU:  No urgency, frequency, dysuria, or hematuria. s/p uterine ablation. Musculoskeletal:  Interval gout.  No back pain.  No joint pain.  No muscle tenderness. Extremities:  Lower extremity swelling.  No pain. Skin:  No rashes or skin changes. Neuro:  No headache, numbness or weakness, balance or coordination issues. Endocrine:  No diabetes.  Thyroid disease on Synthroid.  No hot flashes or night sweats. Psych:  No mood changes, depression or anxiety. Pain:  No focal pain. Review of systems:  All other systems reviewed and found to be negative.   Physical Exam: Blood pressure (!) 143/83, pulse 79, temperature 98.1 F (36.7 C), temperature source Tympanic, resp. rate 18, weight 253 lb 5 oz (114.9 kg), SpO2 96 %. GENERAL:  Well developed, well nourished, tall woman sitting comfortably in the exam room in no acute distress. MENTAL STATUS:  Alert and oriented to person, place and time. HEAD:  Short brown hair.  Normocephalic, atraumatic, face symmetric, no Cushingoid features. EYES:  Blue eyes.  Pupils equal round and reactive to light and accomodation.  No conjunctivitis or scleral icterus. ENT:  Oropharynx clear without lesion.  Tongue normal. Mucous membranes moist.  RESPIRATORY:  Clear to auscultation without rales,  wheezes or rhonchi. CARDIOVASCULAR:  Regular rate and rhythm without murmur, rub or gallop. ABDOMEN:  Soft, non-tender, with active bowel sounds, and no hepatosplenomegaly.  No masses. SKIN:  Scattered upper extremity ecchymosis.  No rashes, ulcers or lesions. EXTREMITIES: Bilateral lower extremity edema below knees.  No skin discoloration or tenderness.  No palpable cords. LYMPH NODES: No palpable cervical, supraclavicular, axillary or inguinal  adenopathy  NEUROLOGICAL: Unremarkable. PSYCH:  Appropriate.    Orders Only on 10/27/2017  Component Date Value Ref Range Status  . Iron 10/27/2017 69  28 - 170 ug/dL Final  . TIBC 10/27/2017 343  250 - 450 ug/dL Final  . Saturation Ratios 10/27/2017 20  10.4 - 31.8 % Final  . UIBC 10/27/2017 274  ug/dL Final   Performed at [facility], [address], [place], [place] [address]  . WBC 10/27/2017 7.9  3.6 - 11.0 K/uL Final  . RBC 10/27/2017 4.22  3.80 - 5.20 MIL/uL Final  . Hemoglobin 10/27/2017 13.1  12.0 - 16.0 g/dL Final  . HCT 10/27/2017 38.6  35.0 - 47.0 % Final  . MCV 10/27/2017 91.5  80.0 - 100.0 fL Final  . MCH 10/27/2017 31.1  26.0 - 34.0 pg Final  . MCHC 10/27/2017 34.0  32.0 - 36.0 g/dL Final  . RDW 10/27/2017 13.6  11.5 - 14.5 % Final  . Platelets 10/27/2017 253  150 - 440 K/uL Final  . Neutrophils Relative % 10/27/2017 63  % Final  . Neutro Abs 10/27/2017 5.1  1.4 - 6.5 K/uL Final  . Lymphocytes Relative 10/27/2017 25  % Final  . Lymphs Abs 10/27/2017 1.9  1.0 - 3.6 K/uL Final  . Monocytes Relative 10/27/2017 9  % Final  . Monocytes Absolute 10/27/2017 0.7  0.2 - 0.9 K/uL Final  . Eosinophils Relative 10/27/2017 2  % Final  . Eosinophils Absolute 10/27/2017 0.1  0 - 0.7 K/uL Final  . Basophils Relative 10/27/2017 1  % Final  . Basophils Absolute 10/27/2017 0.1  0 - 0.1 K/uL Final   Performed at [facility], [address], [place], [place] [address]  . Ferritin 10/27/2017 48  11 - 307 ng/mL Final    Performed at [facility], [address] [place], [place] [address]    Assessment:  Lori Le is a 52 y.o. female s/p gastric bypass surgery (2011) with iron deficiency anemia. She has a history of bleeding ulcer in 10/2015.  She had heavy menses.  She underwent uterine ablation on 08/19/2016.  She bruises easily.  EGD on 12/05/2015 revealed edema and erythema of the gastrojejunal anastomosis. She was treated with Carafate and Protonix.  Repeat EGD on 02/06/2016 revealed minimal erythema on the tips of some gastric folds.  She has never had a colonoscopy.  Diet is good.  She denies any ice pica.  She is intolerant of oral iron secondary to severe constipation.  She has B12 deficiency.  B12 was 168 on 12/29/2012, 367 on 11/29/2015, and 252 on 10/24/2016.  She restarted B12 (last 01/24/2017).  Folate was 30 on 10/24/2016.  Workup on 03/08/2016 revealed a hematocrit of 30.1, hemoglobin 9.1, MCV 70.7, platelets 331,000, WBC 8200 with an ANC of 5800.  Ferritin was 5.  PT and PTT were normal.  Platelet function assay was normal.  She received Venofer 200 mg IV weekly x 4 (03/15/2016 - 04/05/2016), x 2 (07/23/2016 and 08/05/2016), x 3 (01/29/2017 - 02/12/2017),  x 2 (05/01/2017 - 05/08/2017) and x 2 (08/04/2017 - 08/13/2017).  She receives Venofer if her ferritin is < 30.  Ferritin has been followed: 8 on 01/24/2017, 20 on 04/28/2017, 18 on 07/28/2017, 135 on 09/12/2017, and 48 on 10/27/2017.  She has a family history of breast cancer.  Screening bilateral mammogram on 05/08/2016 revealed no evidence of malignancy.  Symptomatically, she feels "ok".  She has had issues with fluid retention and gout.  She has a history of blood in her stool.  She has had to cancel 2 colonoscopies.  Exam reveals bilateral lower extremity edema (below knees).  Hematocrit is 38.6 and hemoglobin 13.1.  Ferritin is 48.  Plan: 1.  Review labs from 10/27/2017. 2.  Iron deficiency anemia:  No IV iron needed  today.  Continue close monitoring as she has required IV iron every 3 month. 3.  Discuss GI follow-up given history of bleeding.  Appointment to be scheduled on a Monday with a new provider who can perform endoscopies on a Monday after a 2 day weekend bowel prep to allow decreased time off of work per her request. 4.  B12 deficiency:  Discuss need for monthly injections. - will Rx for home (next due 11/13/2017). 5.  RTC in 3 months for labs (CBC with diff, ferritin). 6.  RTC in 6 months for MD assessment, labs (CBC with diff, ferritin, iron studies- day before) +/- IV iron.   Honor Loh, NP  10/28/2017, 12:24 PM   I saw and evaluated the patient, participating in the key portions of the service and reviewing pertinent diagnostic studies and records.  I reviewed the nurse practitioner's note and agree with the findings and the plan.  The assessment and plan were discussed with the patient.  Several questions were asked by the patient and answered.   Nolon Stalls, MD 10/28/2017,12:24 PM

## 2017-10-28 NOTE — Progress Notes (Signed)
Patient offers no complaints today. 

## 2017-12-16 ENCOUNTER — Other Ambulatory Visit (HOSPITAL_COMMUNITY): Payer: Self-pay | Admitting: Psychiatry

## 2017-12-16 DIAGNOSIS — F331 Major depressive disorder, recurrent, moderate: Secondary | ICD-10-CM

## 2017-12-17 ENCOUNTER — Encounter (HOSPITAL_COMMUNITY): Payer: Self-pay | Admitting: Psychiatry

## 2017-12-17 ENCOUNTER — Ambulatory Visit (INDEPENDENT_AMBULATORY_CARE_PROVIDER_SITE_OTHER): Payer: 59 | Admitting: Psychiatry

## 2017-12-17 VITALS — BP 150/74 | Ht 68.0 in | Wt 256.0 lb

## 2017-12-17 DIAGNOSIS — F330 Major depressive disorder, recurrent, mild: Secondary | ICD-10-CM | POA: Diagnosis not present

## 2017-12-17 DIAGNOSIS — I10 Essential (primary) hypertension: Secondary | ICD-10-CM | POA: Diagnosis not present

## 2017-12-17 DIAGNOSIS — R609 Edema, unspecified: Secondary | ICD-10-CM

## 2017-12-17 DIAGNOSIS — Z62810 Personal history of physical and sexual abuse in childhood: Secondary | ICD-10-CM

## 2017-12-17 DIAGNOSIS — T43205A Adverse effect of unspecified antidepressants, initial encounter: Secondary | ICD-10-CM

## 2017-12-17 DIAGNOSIS — Z62811 Personal history of psychological abuse in childhood: Secondary | ICD-10-CM

## 2017-12-17 DIAGNOSIS — Z79899 Other long term (current) drug therapy: Secondary | ICD-10-CM

## 2017-12-17 DIAGNOSIS — Z818 Family history of other mental and behavioral disorders: Secondary | ICD-10-CM

## 2017-12-17 MED ORDER — DULOXETINE HCL 30 MG PO CPEP: 30.0000 mg | ORAL_CAPSULE | Freq: Every day | ORAL | 0 refills | Status: DC

## 2017-12-17 NOTE — Patient Instructions (Signed)
   Take Cymbalta 30 mg daily for one week, then every other day for one week, then every 3rd day for one week, then every 4th day for one week,  Etc.[name] Until out of medication
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
## 2017-12-18 ENCOUNTER — Ambulatory Visit (INDEPENDENT_AMBULATORY_CARE_PROVIDER_SITE_OTHER): Payer: Managed Care, Other (non HMO) | Admitting: Nurse Practitioner

## 2017-12-18 ENCOUNTER — Ambulatory Visit (INDEPENDENT_AMBULATORY_CARE_PROVIDER_SITE_OTHER): Payer: Managed Care, Other (non HMO) | Admitting: Vascular Surgery

## 2017-12-18 ENCOUNTER — Ambulatory Visit (INDEPENDENT_AMBULATORY_CARE_PROVIDER_SITE_OTHER): Payer: Managed Care, Other (non HMO)

## 2017-12-18 ENCOUNTER — Encounter (INDEPENDENT_AMBULATORY_CARE_PROVIDER_SITE_OTHER): Payer: Self-pay | Admitting: Vascular Surgery

## 2017-12-18 ENCOUNTER — Other Ambulatory Visit: Payer: Self-pay | Admitting: Internal Medicine

## 2017-12-18 ENCOUNTER — Encounter

## 2017-12-18 VITALS — BP 114/74 | HR 66 | Resp 16 | Ht 67.5 in | Wt 250.8 lb

## 2017-12-18 DIAGNOSIS — I872 Venous insufficiency (chronic) (peripheral): Secondary | ICD-10-CM | POA: Diagnosis not present

## 2017-12-18 DIAGNOSIS — I890 Lymphedema, not elsewhere classified: Secondary | ICD-10-CM

## 2017-12-18 DIAGNOSIS — R600 Localized edema: Secondary | ICD-10-CM | POA: Diagnosis not present

## 2017-12-18 NOTE — Progress Notes (Signed)
Subjective:    Patient ID: Lori Le, female    DOB: 11-06-1965, 52 y.o.   MRN: [id number] Chief Complaint  Patient presents with  . Follow-up    43mon th venous duplex   The patient presents for a 71-month follow-up.  Patient was initially seen for bilateral lower extremity edema discomfort.  Since her initial visit, the patient has been engaging in conservative therapy including wearing medical grade 1 compression socks, elevating her legs or remaining active with minimal improvement to the swelling and discomfort in her legs.  The patient feels that her symptoms have progressed to the point that she is unable to function on a daily basis and they have become lifestyle limiting.  The patient does deny any ulcer formation to the bilateral legs.  The patient denies any recent bouts of cellulitis to the bilateral legs.  The patient underwent a bilateral lower extremity venous reflux duplex which was notable for reflux in the bilateral common femoral vein.  No evidence of deep vein thrombosis.  No evidence of superficial thrombophlebitis.  The patient denies any fever, nausea or vomiting.  Review of Systems  Constitutional: Negative.   HENT: Negative.   Eyes: Negative.   Respiratory: Negative.   Cardiovascular: Positive for leg swelling.  Gastrointestinal: Negative.   Endocrine: Negative.   Genitourinary: Negative.   Musculoskeletal: Negative.   Skin: Negative.   Allergic/Immunologic: Negative.   Neurological: Negative.   Hematological: Negative.   Psychiatric/Behavioral: Negative.       Objective:   Physical Exam  Constitutional: She appears well-developed and well-nourished. No distress.  HENT:  Head: Normocephalic and atraumatic.  Right Ear: External ear normal.  Left Ear: External ear normal.  Eyes: Pupils are equal, round, and reactive to light. Conjunctivae and EOM are normal.  Neck: Normal range of motion.  Cardiovascular: Normal rate, regular rhythm, normal heart sounds  and intact distal pulses.  Pulses:      Radial pulses are 2+ on the right side, and 2+ on the left side.  Hard to palpate pedal pulses due to body habitus and edema however the bilateral feet are warm.  Good capillary refill.  Pulmonary/Chest: Effort normal and breath sounds normal.  Musculoskeletal: Normal range of motion. She exhibits edema (Mild to moderate nonpitting edema noted bilaterally).  Skin: Skin is warm and dry. She is not diaphoretic.  Psychiatric: She has a normal mood and affect. Her behavior is normal. Judgment and thought content normal.  Vitals reviewed.  BP 114/74 (BP Location: Right Arm)   Pulse 66   Resp 16   Ht 5' 7.5" (1.715 m)   Wt 250 lb 12.8 oz (113.8 kg)   BMI 38.70 kg/m   Past Medical History:  Diagnosis Date  . Allergic rhinitis due to allergen   . Allergy   . Anemia   . Arthritis   . Cancer ([place])    skin ca  . Chronic idiopathic urticaria   . Complication of anesthesia    nausea, slow to wake up  . Depression   . GERD (gastroesophageal reflux disease)   . Gout   . Gout   . Hashimoto's thyroiditis   . Heart murmur   . History of eating disorder   . Hives   . Hyperlipidemia   . Hypertension   . Hypothyroidism   . Lower extremity edema   . Melanoma in situ ([place])    left shoulder  . Migraine   . Mitral valve disorder   . Motion  sickness    all moving vehicles  . PCOS (polycystic ovarian syndrome)   . PONV (postoperative nausea and vomiting)   . Thyroid disease   . Ulcer   . Vertigo    last episode over 1 yr ago   Social History   Socioeconomic History  . Marital status: Divorced    Spouse name: Not on file  . Number of children: Not on file  . Years of education: Not on file  . Highest education level: Not on file  Occupational History  . Not on file  Social Needs  . Financial resource strain: Not on file  . Food insecurity:    Worry: Not on file    Inability: Not on file  . Transportation needs:    Medical: Not on file      Non-medical: Not on file  Tobacco Use  . Smoking status: Never Smoker  . Smokeless tobacco: Never Used  Substance and Sexual Activity  . Alcohol use: Yes    Alcohol/week: 0.0 - 1.0 standard drinks    Comment: occ.  . Drug use: No  . Sexual activity: Yes    Partners: Male    Birth control/protection: Surgical    Comment: Ablation   Lifestyle  . Physical activity:    Days per week: Not on file    Minutes per session: Not on file  . Stress: Not on file  Relationships  . Social connections:    Talks on phone: Not on file    Gets together: Not on file    Attends religious service: Not on file    Active member of club or organization: Not on file    Attends meetings of clubs or organizations: Not on file    Relationship status: Not on file  . Intimate partner violence:    Fear of current or ex partner: Not on file    Emotionally abused: Not on file    Physically abused: Not on file    Forced sexual activity: Not on file  Other Topics Concern  . Not on file  Social History Narrative  . Not on file   Past Surgical History:  Procedure Laterality Date  . BREAST BIOPSY Right    neg- bx/clip  . CHOLECYSTECTOMY  2003  . DILITATION & CURRETTAGE/HYSTROSCOPY WITH NOVASURE ABLATION N/A 08/19/2016   Procedure: DILATATION & CURETTAGE/HYSTEROSCOPY WITH NOVASURE ABLATION;  Surgeon: [name], MD;  Location: [facility] ORS;  Service: Gynecology;  Laterality: N/A;  . ESOPHAGOGASTRODUODENOSCOPY (EGD) WITH PROPOFOL N/A 12/05/2015   Procedure: ESOPHAGOGASTRODUODENOSCOPY (EGD) WITH PROPOFOL;  Surgeon: Lollie Sails, MD;  Location: [facility] ENDOSCOPY;  Service: Endoscopy;  Laterality: N/A;  . ESOPHAGOGASTRODUODENOSCOPY (EGD) WITH PROPOFOL N/A 02/06/2016   Procedure: ESOPHAGOGASTRODUODENOSCOPY (EGD) WITH PROPOFOL;  Surgeon: Lollie Sails, MD;  Location: [facility] ENDOSCOPY;  Service: Endoscopy;  Laterality: N/A;  . FOOT SURGERY  1994  . GASTRIC BYPASS  2011  . JOINT REPLACEMENT Left 09/27/2014    HIP  . JOINT REPLACEMENT Left 12/01/2014   KNEE  . KNEE ARTHROSCOPY Right   . KNEE ARTHROSCOPY Left 03/23/2015   Procedure: ARTHROSCOPY KNEE, partial synovectomy;  Surgeon: Hessie Knows, MD;  Location: [facility] ORS;  Service: Orthopedics;  Laterality: Left;  . LAPAROSCOPIC GASTRIC BANDING  2010  . ROTATOR CUFF REPAIR Bilateral    right shoulder 07-26-2016  . SHOULDER ARTHROSCOPY Left 06/20/2015   Procedure: ARTHROSCOPY SHOULDER, REPAIR OF MASSIVE ROTATOR CUFF TEAR, TENODESIS, DECOMPRESSION DEBRIDEMENT;  Surgeon: Corky Mull, MD;  Location: [facility] ORS;  Service: Orthopedics;  Laterality: Left;  . TOTAL HIP ARTHROPLASTY Left 09/27/2014   Procedure: TOTAL HIP ARTHROPLASTY ANTERIOR APPROACH;  Surgeon: Hessie Knows, MD;  Location: [facility] ORS;  Service: Orthopedics;  Laterality: Left;  . TOTAL KNEE ARTHROPLASTY Left 12/01/2014   Procedure: TOTAL KNEE ARTHROPLASTY;  Surgeon: Hessie Knows, MD;  Location: [facility] ORS;  Service: Orthopedics;  Laterality: Left;   Family History  Problem Relation Age of Onset  . Arthritis Mother   . Hyperlipidemia Mother   . Hypertension Mother   . Diabetes Mother   . Arthritis Father   . Hyperlipidemia Father   . Hypertension Father   . Mental illness Father   . Diabetes Father   . Arthritis Maternal Grandmother   . Cancer Maternal Grandmother        breast cancer  . Hyperlipidemia Maternal Grandmother   . Hypertension Maternal Grandmother   . Breast cancer Maternal Grandmother   . Arthritis Maternal Grandfather   . Hyperlipidemia Maternal Grandfather   . Hypertension Maternal Grandfather   . Heart disease Maternal Grandfather        heart attack  . Breast cancer Paternal Grandmother    Allergies  Allergen Reactions  . Etodolac Anaphylaxis  . Influenza Vaccines Hives  . Lactose Nausea And Vomiting    Can have yogurt Can not have milk and ice cream   . Lactose Intolerance (Gi) Nausea And Vomiting    Can have yogurt Can not have milk and ice cream    .  Bacitracin-Neomycin-Polymyxin Rash  . Contrast Media [Iodinated Diagnostic Agents] Rash    MRI dye  . Neomycin-Bacitracin Zn-Polymyx Rash  . Neosporin + Pain Relief Max St [Neomy-Bacit-Polymyx-Pramoxine] Rash      Assessment & Plan:  The patient presents for a 66-month follow-up.  Patient was initially seen for bilateral lower extremity edema discomfort.  Since her initial visit, the patient has been engaging in conservative therapy including wearing medical grade 1 compression socks, elevating her legs or remaining active with minimal improvement to the swelling and discomfort in her legs.  The patient feels that her symptoms have progressed to the point that she is unable to function on a daily basis and they have become lifestyle limiting.  The patient does deny any ulcer formation to the bilateral legs.  The patient denies any recent bouts of cellulitis to the bilateral legs.  The patient underwent a bilateral lower extremity venous reflux duplex which was notable for reflux in the bilateral common femoral vein.  No evidence of deep vein thrombosis.  No evidence of superficial thrombophlebitis.  The patient denies any fever, nausea or vomiting.  1. Chronic venous insufficiency - New Patient was found to have venous insufficiency located to the bilateral common femoral vein. Due to the location of the patient's venous reflux being located in the deep venous system she is not a candidate for endovenous laser ablation  2. Lymphedema - New Despite conservative treatments including exercise, elevation and class I compression stockings the patient still presents with stage I lymphedema. The patient has failed a trial of conservative therapy The patient's symptoms have progressed to the point that she is unable to function on a daily basis and they have become lifestyle limiting The patient will greatly benefit from the added therapy of a lymphedema pump I will applied to the patient's insurance The  patient will follow-up in 6 months so I can assess her progress with conservative therapy and lymphedema pump.  Current Outpatient Medications on File Prior  to Visit  Medication Sig Dispense Refill  . acetaminophen (TYLENOL) 650 MG CR tablet Take 1,300 mg by mouth 2 (two) times daily.    [name] allopurinol (ZYLOPRIM) 300 MG tablet TAKE 1 TABLET BY MOUTH DAILY 90 tablet 2  . azelastine (ASTELIN) 0.1 % nasal spray U 1 TO 2 SPRAYS IEN BID  6  . colchicine 0.6 MG tablet Take 1 tablet (0.6 mg total) by mouth 2 (two) times daily. 40 tablet 0  . cyanocobalamin (,VITAMIN B-12,) 1000 MCG/ML injection Inject 1 mL (1,000 mcg total) into the muscle every 30 (thirty) days. 1 mL 5  . DULoxetine (CYMBALTA) 30 MG capsule Take 1 capsule (30 mg total) by mouth daily. 30 capsule 0  . DYMISTA 137-50 MCG/ACT SUSP SPRAY 1 SPRAY INTO EACH NOSTRIL BID  2  . furosemide (LASIX) 40 MG tablet Take 1 tablet (40 mg total) by mouth daily. 90 tablet 1  . levocetirizine (XYZAL) 5 MG tablet every evening.  3  . levothyroxine (SYNTHROID, LEVOTHROID) 137 MCG tablet TAKE ONE TABLET BY MOUTH ONCE DAILY BEFORE BREAKFAST (Patient taking differently: Take 137 mcg by mouth daily before breakfast. DAILY BEFORE BREAKFAST) 90 tablet 0  . montelukast (SINGULAIR) 10 MG tablet Take 10 mg by mouth daily.  3  . potassium chloride (K-DUR,KLOR-CON) 10 MEQ tablet TAKE 1 TABLET BY MOUTH twice daily 180 tablet 1  . SYRINGE-NEEDLE, DISP, 3 ML (SAFETY SYRINGE/NEEDLE) 23G X 1" 3 ML MISC Use to administer parenteral B12 supplementation. 6 each 0  . triamterene-hydrochlorothiazide (MAXZIDE-25) 37.5-25 MG tablet Take 1 tablet by mouth daily. 90 tablet 0  . mupirocin ointment (BACTROBAN) 2 % Place 1 application into the nose 2 (two) times daily. (Patient not taking: Reported on 09/03/2017) 22 g 0   Current Facility-Administered Medications on File Prior to Visit  Medication Dose Route Frequency Provider Last Rate Last Dose  . 0.9 %  sodium chloride infusion    Intravenous Continuous Lequita Asal, MD   Stopped at 05/08/17 1437  . cyanocobalamin ((VITAMIN B-12)) injection 1,000 mcg  1,000 mcg Intramuscular Once Lequita Asal, MD       There are no Patient Instructions on file for this visit. No follow-ups on file.  KIMBERLY A STEGMAYER, PA-C

## 2017-12-25 ENCOUNTER — Telehealth: Payer: Self-pay

## 2017-12-25 NOTE — Telephone Encounter (Signed)
LMTCB

## 2017-12-25 NOTE — Telephone Encounter (Signed)
Yes.  Keep appt scheduled.  Also, can see if have earlier appt if pt desires (I should be able to work her in).

## 2017-12-25 NOTE — Telephone Encounter (Signed)
Patient has been moved up

## 2017-12-25 NOTE — Telephone Encounter (Signed)
Last office visit 09/12/2017. Can we complete appeal with out an appt?

## 2017-12-25 NOTE — Telephone Encounter (Signed)
Copied from [place] [id number]. Topic: General - Other >> Dec 25, 2017 10:55 AM Bea Graff, NT wrote: Reason for CRM: Patient calling and states she needs an appeal form for employee wellness screening filled out from her dr due to patient not meeting her BMI requirement and needs to state that her and her doctor have a plan set in place. Can this be completed without an appt as it is due the day of her next appt and could not find anything earlier with Dr. Nicki Reaper? Or can pt see a different provider to have this completed? Please advise pt.

## 2018-01-12 ENCOUNTER — Encounter: Payer: Self-pay | Admitting: Internal Medicine

## 2018-01-12 ENCOUNTER — Ambulatory Visit (INDEPENDENT_AMBULATORY_CARE_PROVIDER_SITE_OTHER): Payer: Managed Care, Other (non HMO) | Admitting: Internal Medicine

## 2018-01-12 DIAGNOSIS — R600 Localized edema: Secondary | ICD-10-CM

## 2018-01-12 DIAGNOSIS — N39492 Postural (urinary) incontinence: Secondary | ICD-10-CM

## 2018-01-12 DIAGNOSIS — D500 Iron deficiency anemia secondary to blood loss (chronic): Secondary | ICD-10-CM | POA: Diagnosis not present

## 2018-01-12 DIAGNOSIS — R232 Flushing: Secondary | ICD-10-CM

## 2018-01-12 DIAGNOSIS — D508 Other iron deficiency anemias: Secondary | ICD-10-CM

## 2018-01-12 DIAGNOSIS — F32A Depression, unspecified: Secondary | ICD-10-CM

## 2018-01-12 DIAGNOSIS — E039 Hypothyroidism, unspecified: Secondary | ICD-10-CM

## 2018-01-12 DIAGNOSIS — R739 Hyperglycemia, unspecified: Secondary | ICD-10-CM

## 2018-01-12 DIAGNOSIS — I10 Essential (primary) hypertension: Secondary | ICD-10-CM

## 2018-01-12 DIAGNOSIS — D509 Iron deficiency anemia, unspecified: Secondary | ICD-10-CM

## 2018-01-12 DIAGNOSIS — R32 Unspecified urinary incontinence: Secondary | ICD-10-CM | POA: Insufficient documentation

## 2018-01-12 DIAGNOSIS — Z6838 Body mass index (BMI) 38.0-38.9, adult: Secondary | ICD-10-CM | POA: Diagnosis not present

## 2018-01-12 DIAGNOSIS — D039 Melanoma in situ, unspecified: Secondary | ICD-10-CM

## 2018-01-12 DIAGNOSIS — E7800 Pure hypercholesterolemia, unspecified: Secondary | ICD-10-CM

## 2018-01-12 DIAGNOSIS — K219 Gastro-esophageal reflux disease without esophagitis: Secondary | ICD-10-CM

## 2018-01-12 DIAGNOSIS — E538 Deficiency of other specified B group vitamins: Secondary | ICD-10-CM | POA: Diagnosis not present

## 2018-01-12 DIAGNOSIS — F329 Major depressive disorder, single episode, unspecified: Secondary | ICD-10-CM

## 2018-01-12 NOTE — Assessment & Plan Note (Signed)
Followed by dermatology

## 2018-01-12 NOTE — Progress Notes (Signed)
Pre visit review using our clinic review tool, if applicable. No additional management support is needed unless otherwise documented below in the visit note. 

## 2018-01-12 NOTE — Assessment & Plan Note (Signed)
Controlled on current regimen.  Follow.  

## 2018-01-12 NOTE — Assessment & Plan Note (Signed)
Evaluated by hematology.  Previously received iron infusions.

## 2018-01-12 NOTE — Assessment & Plan Note (Signed)
Low carb diet and exercise.  Follow met b and a1c.  

## 2018-01-12 NOTE — Assessment & Plan Note (Signed)
Increased swelling. Evaluated by vascular surgery.  Unable to wear compression hose.  Makes her legs worse.  In the process of getting a pump.  Follow.

## 2018-01-12 NOTE — Assessment & Plan Note (Signed)
Blood pressure under good control.  Continue same medication regimen.  Follow pressures.  Follow metabolic panel.   

## 2018-01-12 NOTE — Assessment & Plan Note (Signed)
On thyroid replacement.  Follow tsh.  

## 2018-01-12 NOTE — Assessment & Plan Note (Signed)
Discussed diet and exercise.  Follow.  

## 2018-01-12 NOTE — Assessment & Plan Note (Signed)
Doing better. Tapering off cymbalta.

## 2018-01-12 NOTE — Assessment & Plan Note (Signed)
Low cholesterol diet and exercise.  Follow lipid panel.   

## 2018-01-12 NOTE — Assessment & Plan Note (Signed)
Worsening problem.  Notices urine leakage with standing, etc.  Refer to physical therapy for pelvic floor exercises, etc.

## 2018-01-12 NOTE — Assessment & Plan Note (Signed)
Hot flashes as outlined.  Also reports some mood swings.  Tapering off cymbalta.  Question if could be contributing some to how she feels.  Wants to continue to taper off cymbalta.  Wants to hold on any other medication.  Follow.

## 2018-01-12 NOTE — Assessment & Plan Note (Signed)
Continue b12 injections.  

## 2018-01-12 NOTE — Progress Notes (Signed)
Patient ID: Lori Le, female   DOB: 1965-10-20, 52 y.o.   MRN: [id number]   Subjective:    Patient ID: Lori Le, female    DOB: 09-Jun-1965, 52 y.o.   MRN: [id number]  HPI  Patient here for a scheduled follow up.  She has been seeing psychiatry.  Has been on cymbalta.  Tapering off.  She reports recently noticing increased hot flashes and mood swings.  Previously on estrogen.  Off now.  Would like not to restart.  Discussed question if tapering off cymbalta could be contributing to how she feels.  Overall she feels her stress is better.  Feels she is handling things better.  Does not feel needs to f/u with psychiatry and does not want to be on more medication at this time.  Tries to stay active.  No chest pain.  Breathing stable.  No acid reflux.  No abdominal pain.  Bowels moving.  Trouble with urinary leakage and incontinence with some increased frequency.  Wants to be referred to PT for pelvic floor exercises.  She needs to reschedule f/u for her colonoscopy.  States she will call.  Also saw vascular surgery for lower extremity swelling.  Compression hose do not help.  States they make it worse.  In the process of getting a pump for her lower extremities.  Also seeing podiatry for toe fracture.  States this has limited her exercise.  Reviewed her screening labs through her work. Discussed need for MMR booster.     Past Medical History:  Diagnosis Date  . Allergic rhinitis due to allergen   . Allergy   . Anemia   . Arthritis   . Cancer ([place])    skin ca  . Chronic idiopathic urticaria   . Complication of anesthesia    nausea, slow to wake up  . Depression   . GERD (gastroesophageal reflux disease)   . Gout   . Gout   . Hashimoto's thyroiditis   . Heart murmur   . History of eating disorder   . Hives   . Hyperlipidemia   . Hypertension   . Hypothyroidism   . Lower extremity edema   . Melanoma in situ ([place])    left shoulder  . Migraine   . Mitral valve disorder   . Motion  sickness    all moving vehicles  . PCOS (polycystic ovarian syndrome)   . PONV (postoperative nausea and vomiting)   . Thyroid disease   . Ulcer   . Vertigo    last episode over 1 yr ago   Past Surgical History:  Procedure Laterality Date  . BREAST BIOPSY Right    neg- bx/clip  . CHOLECYSTECTOMY  2003  . DILITATION & CURRETTAGE/HYSTROSCOPY WITH NOVASURE ABLATION N/A 08/19/2016   Procedure: DILATATION & CURETTAGE/HYSTEROSCOPY WITH NOVASURE ABLATION;  Surgeon: [name], MD;  Location: [facility] ORS;  Service: Gynecology;  Laterality: N/A;  . ESOPHAGOGASTRODUODENOSCOPY (EGD) WITH PROPOFOL N/A 12/05/2015   Procedure: ESOPHAGOGASTRODUODENOSCOPY (EGD) WITH PROPOFOL;  Surgeon: Lollie Sails, MD;  Location: [facility] ENDOSCOPY;  Service: Endoscopy;  Laterality: N/A;  . ESOPHAGOGASTRODUODENOSCOPY (EGD) WITH PROPOFOL N/A 02/06/2016   Procedure: ESOPHAGOGASTRODUODENOSCOPY (EGD) WITH PROPOFOL;  Surgeon: Lollie Sails, MD;  Location: [facility] ENDOSCOPY;  Service: Endoscopy;  Laterality: N/A;  . FOOT SURGERY  1994  . GASTRIC BYPASS  2011  . JOINT REPLACEMENT Left 09/27/2014   HIP  . JOINT REPLACEMENT Left 12/01/2014   KNEE  . KNEE ARTHROSCOPY Right   . KNEE ARTHROSCOPY  Left 03/23/2015   Procedure: ARTHROSCOPY KNEE, partial synovectomy;  Surgeon: Hessie Knows, MD;  Location: [facility] ORS;  Service: Orthopedics;  Laterality: Left;  . LAPAROSCOPIC GASTRIC BANDING  2010  . ROTATOR CUFF REPAIR Bilateral    right shoulder 07-26-2016  . SHOULDER ARTHROSCOPY Left 06/20/2015   Procedure: ARTHROSCOPY SHOULDER, REPAIR OF MASSIVE ROTATOR CUFF TEAR, TENODESIS, DECOMPRESSION DEBRIDEMENT;  Surgeon: Corky Mull, MD;  Location: [facility] ORS;  Service: Orthopedics;  Laterality: Left;  . TOTAL HIP ARTHROPLASTY Left 09/27/2014   Procedure: TOTAL HIP ARTHROPLASTY ANTERIOR APPROACH;  Surgeon: Hessie Knows, MD;  Location: [facility] ORS;  Service: Orthopedics;  Laterality: Left;  . TOTAL KNEE ARTHROPLASTY Left 12/01/2014   Procedure:  TOTAL KNEE ARTHROPLASTY;  Surgeon: Hessie Knows, MD;  Location: [facility] ORS;  Service: Orthopedics;  Laterality: Left;   Family History  Problem Relation Age of Onset  . Arthritis Mother   . Hyperlipidemia Mother   . Hypertension Mother   . Diabetes Mother   . Arthritis Father   . Hyperlipidemia Father   . Hypertension Father   . Mental illness Father   . Diabetes Father   . Arthritis Maternal Grandmother   . Cancer Maternal Grandmother        breast cancer  . Hyperlipidemia Maternal Grandmother   . Hypertension Maternal Grandmother   . Breast cancer Maternal Grandmother   . Arthritis Maternal Grandfather   . Hyperlipidemia Maternal Grandfather   . Hypertension Maternal Grandfather   . Heart disease Maternal Grandfather        heart attack  . Breast cancer Paternal Grandmother    Social History   Socioeconomic History  . Marital status: Divorced    Spouse name: Not on file  . Number of children: Not on file  . Years of education: Not on file  . Highest education level: Not on file  Occupational History  . Not on file  Social Needs  . Financial resource strain: Not on file  . Food insecurity:    Worry: Not on file    Inability: Not on file  . Transportation needs:    Medical: Not on file    Non-medical: Not on file  Tobacco Use  . Smoking status: Never Smoker  . Smokeless tobacco: Never Used  Substance and Sexual Activity  . Alcohol use: Yes    Alcohol/week: 0.0 - 1.0 standard drinks    Comment: occ.  . Drug use: No  . Sexual activity: Yes    Partners: Male    Birth control/protection: Surgical    Comment: Ablation   Lifestyle  . Physical activity:    Days per week: Not on file    Minutes per session: Not on file  . Stress: Not on file  Relationships  . Social connections:    Talks on phone: Not on file    Gets together: Not on file    Attends religious service: Not on file    Active member of club or organization: Not on file    Attends meetings of  clubs or organizations: Not on file    Relationship status: Not on file  Other Topics Concern  . Not on file  Social History Narrative  . Not on file    Outpatient Encounter Medications as of 01/12/2018  Medication Sig  . acetaminophen (TYLENOL) 650 MG CR tablet Take 1,300 mg by mouth 2 (two) times daily.  [name] allopurinol (ZYLOPRIM) 300 MG tablet TAKE 1 TABLET BY MOUTH DAILY  . azelastine (ASTELIN) 0.1 %  nasal spray U 1 TO 2 SPRAYS IEN BID  . colchicine 0.6 MG tablet Take 1 tablet (0.6 mg total) by mouth 2 (two) times daily.  . cyanocobalamin (,VITAMIN B-12,) 1000 MCG/ML injection Inject 1 mL (1,000 mcg total) into the muscle every 30 (thirty) days.  . DYMISTA 137-50 MCG/ACT SUSP SPRAY 1 SPRAY INTO EACH NOSTRIL BID  . furosemide (LASIX) 40 MG tablet Take 1 tablet (40 mg total) by mouth daily.  [name] levocetirizine (XYZAL) 5 MG tablet every evening.  [name] levothyroxine (SYNTHROID, LEVOTHROID) 137 MCG tablet TAKE ONE TABLET BY MOUTH ONCE DAILY BEFORE BREAKFAST (Patient taking differently: Take 137 mcg by mouth daily before breakfast. DAILY BEFORE BREAKFAST)  . montelukast (SINGULAIR) 10 MG tablet Take 10 mg by mouth daily.  . mupirocin ointment (BACTROBAN) 2 % Place 1 application into the nose 2 (two) times daily.  . pantoprazole (PROTONIX) 40 MG tablet TAKE 1 TABLET BY MOUTH EVERY DAY  . potassium chloride (K-DUR,KLOR-CON) 10 MEQ tablet TAKE 1 TABLET BY MOUTH twice daily  . SYRINGE-NEEDLE, DISP, 3 ML (SAFETY SYRINGE/NEEDLE) 23G X 1" 3 ML MISC Use to administer parenteral B12 supplementation.  . triamterene-hydrochlorothiazide (MAXZIDE-25) 37.5-25 MG tablet Take 1 tablet by mouth daily.  . [DISCONTINUED] DULoxetine (CYMBALTA) 30 MG capsule Take 1 capsule (30 mg total) by mouth daily.   Facility-Administered Encounter Medications as of 01/12/2018  Medication  . 0.9 %  sodium chloride infusion  . cyanocobalamin ((VITAMIN B-12)) injection 1,000 mcg    Review of Systems  Constitutional: Negative  for appetite change and unexpected weight change.  HENT: Negative for congestion and sinus pressure.   Respiratory: Negative for cough, chest tightness and shortness of breath.   Cardiovascular: Positive for leg swelling. Negative for chest pain and palpitations.  Gastrointestinal: Negative for abdominal pain, diarrhea, nausea and vomiting.  Endocrine:       Hot flashes.    Genitourinary: Negative for dysuria.       Increased incontinence.  States urine will leak out when she stands.  Increased frequency.    Musculoskeletal: Negative for joint swelling and myalgias.  Skin: Negative for color change and rash.  Neurological: Negative for dizziness, light-headedness and headaches.  Psychiatric/Behavioral: Negative for agitation and dysphoric mood.       Increased mood swings.         Objective:    Physical Exam  Constitutional: She appears well-developed and well-nourished. No distress.  HENT:  Nose: Nose normal.  Mouth/Throat: Oropharynx is clear and moist.  Neck: Neck supple. No thyromegaly present.  Cardiovascular: Normal rate and regular rhythm.  Pulmonary/Chest: Breath sounds normal. No respiratory distress. She has no wheezes.  Abdominal: Soft. Bowel sounds are normal. There is no tenderness.  Musculoskeletal: She exhibits no tenderness.  Increased lower extremity swelling.    Lymphadenopathy:    She has no cervical adenopathy.  Skin: No rash noted. No erythema.  Psychiatric: She has a normal mood and affect. Her behavior is normal.    BP 140/86   Pulse 88   Temp 97.8 F (36.6 C) (Oral)   Ht 5' 7.5" (1.715 m)   Wt 250 lb 3.2 oz (113.5 kg)   SpO2 96%   BMI 38.61 kg/m  Wt Readings from Last 3 Encounters:  01/12/18 250 lb 3.2 oz (113.5 kg)  12/18/17 250 lb 12.8 oz (113.8 kg)  12/17/17 256 lb (116.1 kg)     Lab Results  Component Value Date   WBC 7.9 10/27/2017   HGB 13.1 10/27/2017  HCT 38.6 10/27/2017   PLT 253 10/27/2017   GLUCOSE 155 (H) 09/23/2017    CHOL 202 (H) 04/10/2017   TRIG 265 (H) 04/10/2017   HDL 67 04/10/2017   LDLDIRECT 121.0 11/16/2014   LDLCALC 82 04/10/2017   ALT 17 09/12/2017   AST 10 09/12/2017   NA 138 09/23/2017   K 3.5 09/23/2017   CL 97 09/23/2017   CREATININE 0.67 09/23/2017   BUN 21 09/23/2017   CO2 29 09/23/2017   TSH 0.54 12/26/2016   INR 0.99 03/08/2016   HGBA1C 5.5 04/10/2017    Mr Foot Right W Wo Contrast  Result Date: 05/24/2017 CLINICAL DATA:  History of stepping on glass in November 9432 complicated by infection, status post incision and drainage last week. EXAM: MRI OF THE RIGHT FOREFOOT WITHOUT AND WITH CONTRAST TECHNIQUE: Multiplanar, multisequence MR imaging of the right forefoot was performed before and after the administration of intravenous contrast. CONTRAST:  83m MULTIHANCE GADOBENATE DIMEGLUMINE 529 MG/ML IV SOLN COMPARISON:  Right foot x-rays dated February 24, 2017. FINDINGS: Bones/Joint/Cartilage No suspicious marrow signal abnormality. Small focus of degenerative marrow edema in the cuboid at the fourth tarsometatarsal joint. No fracture or dislocation. Normal alignment. No joint effusion. Susceptibility artifact related to cerclage wire in the first proximal phalanx. Ligaments Collateral ligaments are intact.  Lisfranc ligament is intact. Muscles and Tendons Flexor, peroneal and extensor compartment tendons are intact. Prominent increased T2 signal and enhancement of the intrinsic forefoot muscles surrounding the mid to distal third metatarsal. Soft tissue Small area of ulceration at the plantar base of the third metatarsal head. No fluid collection or hematoma. No soft tissue mass. IMPRESSION: 1. Prominent edema and enhancement of the intrinsic forefoot muscles surrounding the mid to distal third metatarsal, nonspecific, but suspicious for myositis given clinical history. No drainable fluid collection. 2. Small ulceration at the plantar base of the third metatarsal head may represent the site of  recent incision and drainage. Correlate clinically. 3. No evidence of osteomyelitis. Electronically Signed   By: WTitus DubinM.D.   On: 05/24/2017 10:29       Assessment & Plan:   Problem List Items Addressed This Visit    Anemia    Has been evaluated by hematology.  Previously received iron infusions.  Follow cbc and iron stores.        B12 deficiency    Continue b12 injections.        Bilateral lower extremity edema    Increased swelling. Evaluated by vascular surgery.  Unable to wear compression hose.  Makes her legs worse.  In the process of getting a pump.  Follow.       BMI 38.0-38.9,adult    Discussed diet and exercise.  Follow.        Depression    Doing better. Tapering off cymbalta.        Essential hypertension, benign    Blood pressure under good control.  Continue same medication regimen.  Follow pressures.  Follow metabolic panel.        GERD (gastroesophageal reflux disease)    Controlled on current regimen.  Follow.        Hot flashes    Hot flashes as outlined.  Also reports some mood swings.  Tapering off cymbalta.  Question if could be contributing some to how she feels.  Wants to continue to taper off cymbalta.  Wants to hold on any other medication.  Follow.        Hypercholesterolemia  Low cholesterol diet and exercise.  Follow lipid panel.        Hyperglycemia    Low carb diet and exercise.  Follow met b and a1c.       Hypothyroidism    On thyroid replacement.  Follow tsh.        Iron deficiency anemia following bariatric surgery    Evaluated by hematology.  Previously received iron infusions.       Melanoma in situ [facility])    Followed by dermatology.       Urinary leakage    Worsening problem.  Notices urine leakage with standing, etc.  Refer to physical therapy for pelvic floor exercises, etc.        Relevant Orders   Ambulatory referral to Physical Therapy      I spent 40 minutes with the patient and more than 50% of  the time was spent in consultation regarding the above.  Time spent discussing current concerns and symptoms.  Time also spent discussing further evaluation and treatment options.    Einar Pheasant, MD

## 2018-01-12 NOTE — Assessment & Plan Note (Signed)
Has been evaluated by hematology.  Previously received iron infusions.  Follow cbc and iron stores.

## 2018-01-14 ENCOUNTER — Other Ambulatory Visit: Payer: Self-pay | Admitting: Internal Medicine

## 2018-01-14 DIAGNOSIS — Z760 Encounter for issue of repeat prescription: Secondary | ICD-10-CM

## 2018-01-18 ENCOUNTER — Encounter: Payer: Self-pay | Admitting: Internal Medicine

## 2018-01-19 NOTE — Telephone Encounter (Signed)
Called patient, she called MD live through her work last night and was started on abx. Advised if symptoms do not improve or worsen, she should be re-evaluated

## 2018-01-20 ENCOUNTER — Encounter: Payer: Self-pay | Admitting: Internal Medicine

## 2018-01-20 DIAGNOSIS — M7989 Other specified soft tissue disorders: Secondary | ICD-10-CM

## 2018-01-20 NOTE — Telephone Encounter (Signed)
Order placed for vascular referral.  She has seen them previously.  With worsening swelling and redness.  Needs appt asap.  Thanks

## 2018-01-20 NOTE — Telephone Encounter (Signed)
Patient would like to be referred back to vascular surgery because swelling is awful. Her leg is still red but not as red and it is sore. Advised if she noticed it getting worse she should be evaluated at urgent care.

## 2018-01-20 NOTE — Telephone Encounter (Signed)
If acute change or worsening swelling or redness, I would recommend urgent care. If better but persistent swelling, I would like to get her back in with vascular surgery.  She has seen them.

## 2018-01-23 ENCOUNTER — Ambulatory Visit: Payer: Self-pay | Admitting: Internal Medicine

## 2018-01-26 LAB — BASIC METABOLIC PANEL
BUN: 22 — AB (ref 4–21)
CREATININE: 1 (ref 0.5–1.1)
GLUCOSE: 131
Potassium: 3.2 — AB (ref 3.4–5.3)
SODIUM: 138 (ref 137–147)

## 2018-01-26 LAB — CBC AND DIFFERENTIAL
HCT: 45 (ref 36–46)
Hemoglobin: 15 (ref 12.0–16.0)
NEUTROS ABS: 5
Platelets: 329 (ref 150–399)
WBC: 7.2

## 2018-01-26 LAB — HEPATIC FUNCTION PANEL
ALT: 26 (ref 7–35)
AST: 20 (ref 13–35)
Alkaline Phosphatase: 61 (ref 25–125)
BILIRUBIN, TOTAL: 0.4

## 2018-01-27 ENCOUNTER — Inpatient Hospital Stay: Payer: Managed Care, Other (non HMO) | Attending: Internal Medicine

## 2018-01-27 DIAGNOSIS — D509 Iron deficiency anemia, unspecified: Secondary | ICD-10-CM | POA: Diagnosis present

## 2018-01-27 DIAGNOSIS — K9589 Other complications of other bariatric procedure: Secondary | ICD-10-CM

## 2018-01-27 LAB — CBC WITH DIFFERENTIAL/PLATELET
Abs Immature Granulocytes: 0.02 10*3/uL (ref 0.00–0.07)
Basophils Absolute: 0.1 10*3/uL (ref 0.0–0.1)
Basophils Relative: 1 %
Eosinophils Absolute: 0.1 10*3/uL (ref 0.0–0.5)
Eosinophils Relative: 1 %
HCT: 44.8 % (ref 36.0–46.0)
Hemoglobin: 14.8 g/dL (ref 12.0–15.0)
Immature Granulocytes: 0 %
Lymphocytes Relative: 19 %
Lymphs Abs: 1.9 10*3/uL (ref 0.7–4.0)
MCH: 29.5 pg (ref 26.0–34.0)
MCHC: 33 g/dL (ref 30.0–36.0)
MCV: 89.4 fL (ref 80.0–100.0)
Monocytes Absolute: 0.9 10*3/uL (ref 0.1–1.0)
Monocytes Relative: 9 %
Neutro Abs: 6.9 10*3/uL (ref 1.7–7.7)
Neutrophils Relative %: 70 %
Platelets: 330 10*3/uL (ref 150–400)
RBC: 5.01 MIL/uL (ref 3.87–5.11)
RDW: 12.6 % (ref 11.5–15.5)
WBC: 9.9 10*3/uL (ref 4.0–10.5)
nRBC: 0 % (ref 0.0–0.2)

## 2018-01-27 LAB — FERRITIN: Ferritin: 53 ng/mL (ref 11–307)

## 2018-01-28 ENCOUNTER — Inpatient Hospital Stay: Payer: Managed Care, Other (non HMO)

## 2018-02-02 ENCOUNTER — Telehealth: Payer: Self-pay | Admitting: *Deleted

## 2018-02-02 NOTE — Telephone Encounter (Signed)
Called patient with Lab results. She reports that she has had severe lethargy, elevated temp for that past three days and loss of appetite.

## 2018-02-02 NOTE — Telephone Encounter (Signed)
Patient called for last week's lab results. Left message for patient to call triage line for results and advised her that labs would be posted to [facility] Chart.

## 2018-02-02 NOTE — Telephone Encounter (Signed)
Opened in error

## 2018-02-02 NOTE — Telephone Encounter (Signed)
Labs are normal. She may have picked up a viral illness. Refer her to her PCP for further evaluation.

## 2018-02-02 NOTE — Telephone Encounter (Signed)
Called patient and advised her to call PCP regarding illness.

## 2018-02-05 ENCOUNTER — Telehealth: Payer: Self-pay

## 2018-02-05 NOTE — Telephone Encounter (Signed)
I do not see referral for neurology?

## 2018-02-05 NOTE — Telephone Encounter (Signed)
Need to confirm with pt who she was to be referred.  (we discussed her urological issues and referred her to physical therapy for pelvic floor exercises.  Also referred to vascular surgery).  See me before calling pt.

## 2018-02-05 NOTE — Telephone Encounter (Signed)
Copied from [place] [id number]. Topic: Referral - Question >> Feb 05, 2018 11:55 AM Conception Chancy, NT wrote: Reason for CRM: Fraser Din is a RN with [employer] and states that she spoke with the patient and she was supposed to be referred to a Neurologist. Please advise.  Cb# [phone] ext [phone]   I don't see a referral placed for neurology. Please advise?

## 2018-02-06 NOTE — Telephone Encounter (Signed)
LMTCB

## 2018-02-06 NOTE — Telephone Encounter (Signed)
Spoke with pt. She has an appt on Monday with Dr. Nicki Reaper. She was wanting to see neurology because she is having issues of no feeling in her feet. Patient says she is not feeling well. She is not having any acute symptoms at this time but just overall does not feel well. She thinks there may be something going on with her thyroid. She says she will discuss at her appt on Monday. Advised if any acute issues she should be evaluated at acute care prior to appt

## 2018-02-09 ENCOUNTER — Ambulatory Visit (INDEPENDENT_AMBULATORY_CARE_PROVIDER_SITE_OTHER): Payer: Managed Care, Other (non HMO) | Admitting: Internal Medicine

## 2018-02-09 VITALS — BP 128/80 | HR 80 | Temp 98.0°F | Resp 18 | Wt 245.0 lb

## 2018-02-09 DIAGNOSIS — E039 Hypothyroidism, unspecified: Secondary | ICD-10-CM

## 2018-02-09 DIAGNOSIS — K9589 Other complications of other bariatric procedure: Secondary | ICD-10-CM

## 2018-02-09 DIAGNOSIS — M79604 Pain in right leg: Secondary | ICD-10-CM

## 2018-02-09 DIAGNOSIS — F329 Major depressive disorder, single episode, unspecified: Secondary | ICD-10-CM

## 2018-02-09 DIAGNOSIS — D039 Melanoma in situ, unspecified: Secondary | ICD-10-CM

## 2018-02-09 DIAGNOSIS — R600 Localized edema: Secondary | ICD-10-CM

## 2018-02-09 DIAGNOSIS — E538 Deficiency of other specified B group vitamins: Secondary | ICD-10-CM | POA: Diagnosis not present

## 2018-02-09 DIAGNOSIS — I10 Essential (primary) hypertension: Secondary | ICD-10-CM

## 2018-02-09 DIAGNOSIS — R0602 Shortness of breath: Secondary | ICD-10-CM

## 2018-02-09 DIAGNOSIS — R739 Hyperglycemia, unspecified: Secondary | ICD-10-CM

## 2018-02-09 DIAGNOSIS — K219 Gastro-esophageal reflux disease without esophagitis: Secondary | ICD-10-CM

## 2018-02-09 DIAGNOSIS — E7800 Pure hypercholesterolemia, unspecified: Secondary | ICD-10-CM

## 2018-02-09 DIAGNOSIS — D500 Iron deficiency anemia secondary to blood loss (chronic): Secondary | ICD-10-CM

## 2018-02-09 DIAGNOSIS — M79605 Pain in left leg: Secondary | ICD-10-CM

## 2018-02-09 DIAGNOSIS — F32A Depression, unspecified: Secondary | ICD-10-CM

## 2018-02-09 DIAGNOSIS — D509 Iron deficiency anemia, unspecified: Secondary | ICD-10-CM

## 2018-02-09 DIAGNOSIS — Z6837 Body mass index (BMI) 37.0-37.9, adult: Secondary | ICD-10-CM

## 2018-02-09 DIAGNOSIS — R208 Other disturbances of skin sensation: Secondary | ICD-10-CM | POA: Diagnosis not present

## 2018-02-09 NOTE — Progress Notes (Signed)
Subjective:    Patient ID: Lori Le, female    DOB: 1965-12-06, 52 y.o.   MRN: [id number]  HPI  Patient here for a scheduled follow up.  Was seen in ER 01/21/18 for increased lower extremity swelling.  Discussed compression hose.  Seeing vascular.  Recommended f/u with cardiology.  She reports recent episode of fatigue and low grade fever.  No fever now.  Still with some fatigue.  Some decreased appetite, but this has improved.  Does report gets out of breath with stairs.  No acid reflux.  No abdominal pain.  Bowels moving.  She reports pain in her feet and legs.  Describes pain and burning (states feels like boiling water).  Has tried to adjust diet.  Swelling improved after metolazone.  Off now.     Past Medical History:  Diagnosis Date  . Allergic rhinitis due to allergen   . Allergy   . Anemia   . Arthritis   . Cancer ([place])    skin ca  . Chronic idiopathic urticaria   . Complication of anesthesia    nausea, slow to wake up  . Depression   . GERD (gastroesophageal reflux disease)   . Gout   . Gout   . Hashimoto's thyroiditis   . Heart murmur   . History of eating disorder   . Hives   . Hyperlipidemia   . Hypertension   . Hypothyroidism   . Lower extremity edema   . Melanoma in situ ([place])    left shoulder  . Migraine   . Mitral valve disorder   . Motion sickness    all moving vehicles  . PCOS (polycystic ovarian syndrome)   . PONV (postoperative nausea and vomiting)   . Thyroid disease   . Ulcer   . Vertigo    last episode over 1 yr ago   Past Surgical History:  Procedure Laterality Date  . BREAST BIOPSY Right    neg- bx/clip  . CHOLECYSTECTOMY  2003  . DILITATION & CURRETTAGE/HYSTROSCOPY WITH NOVASURE ABLATION N/A 08/19/2016   Procedure: DILATATION & CURETTAGE/HYSTEROSCOPY WITH NOVASURE ABLATION;  Surgeon: [name], MD;  Location: [facility] ORS;  Service: Gynecology;  Laterality: N/A;  . ESOPHAGOGASTRODUODENOSCOPY (EGD) WITH PROPOFOL N/A 12/05/2015   Procedure: ESOPHAGOGASTRODUODENOSCOPY (EGD) WITH PROPOFOL;  Surgeon: Lollie Sails, MD;  Location: [facility] ENDOSCOPY;  Service: Endoscopy;  Laterality: N/A;  . ESOPHAGOGASTRODUODENOSCOPY (EGD) WITH PROPOFOL N/A 02/06/2016   Procedure: ESOPHAGOGASTRODUODENOSCOPY (EGD) WITH PROPOFOL;  Surgeon: Lollie Sails, MD;  Location: [facility] ENDOSCOPY;  Service: Endoscopy;  Laterality: N/A;  . FOOT SURGERY  1994  . GASTRIC BYPASS  2011  . JOINT REPLACEMENT Left 09/27/2014   HIP  . JOINT REPLACEMENT Left 12/01/2014   KNEE  . KNEE ARTHROSCOPY Right   . KNEE ARTHROSCOPY Left 03/23/2015   Procedure: ARTHROSCOPY KNEE, partial synovectomy;  Surgeon: Hessie Knows, MD;  Location: [facility] ORS;  Service: Orthopedics;  Laterality: Left;  . LAPAROSCOPIC GASTRIC BANDING  2010  . ROTATOR CUFF REPAIR Bilateral    right shoulder 07-26-2016  . SHOULDER ARTHROSCOPY Left 06/20/2015   Procedure: ARTHROSCOPY SHOULDER, REPAIR OF MASSIVE ROTATOR CUFF TEAR, TENODESIS, DECOMPRESSION DEBRIDEMENT;  Surgeon: Corky Mull, MD;  Location: [facility] ORS;  Service: Orthopedics;  Laterality: Left;  . TOTAL HIP ARTHROPLASTY Left 09/27/2014   Procedure: TOTAL HIP ARTHROPLASTY ANTERIOR APPROACH;  Surgeon: Hessie Knows, MD;  Location: [facility] ORS;  Service: Orthopedics;  Laterality: Left;  . TOTAL KNEE ARTHROPLASTY Left 12/01/2014   Procedure: TOTAL KNEE  ARTHROPLASTY;  Surgeon: Hessie Knows, MD;  Location: [facility] ORS;  Service: Orthopedics;  Laterality: Left;   Family History  Problem Relation Age of Onset  . Arthritis Mother   . Hyperlipidemia Mother   . Hypertension Mother   . Diabetes Mother   . Arthritis Father   . Hyperlipidemia Father   . Hypertension Father   . Mental illness Father   . Diabetes Father   . Arthritis Maternal Grandmother   . Cancer Maternal Grandmother        breast cancer  . Hyperlipidemia Maternal Grandmother   . Hypertension Maternal Grandmother   . Breast cancer Maternal Grandmother   . Arthritis Maternal Grandfather    . Hyperlipidemia Maternal Grandfather   . Hypertension Maternal Grandfather   . Heart disease Maternal Grandfather        heart attack  . Breast cancer Paternal Grandmother    Social History   Socioeconomic History  . Marital status: Divorced    Spouse name: Not on file  . Number of children: Not on file  . Years of education: Not on file  . Highest education level: Not on file  Occupational History  . Not on file  Social Needs  . Financial resource strain: Not on file  . Food insecurity:    Worry: Not on file    Inability: Not on file  . Transportation needs:    Medical: Not on file    Non-medical: Not on file  Tobacco Use  . Smoking status: Never Smoker  . Smokeless tobacco: Never Used  Substance and Sexual Activity  . Alcohol use: Yes    Alcohol/week: 0.0 - 1.0 standard drinks    Comment: occ.  . Drug use: No  . Sexual activity: Yes    Partners: Male    Birth control/protection: Surgical    Comment: Ablation   Lifestyle  . Physical activity:    Days per week: Not on file    Minutes per session: Not on file  . Stress: Not on file  Relationships  . Social connections:    Talks on phone: Not on file    Gets together: Not on file    Attends religious service: Not on file    Active member of club or organization: Not on file    Attends meetings of clubs or organizations: Not on file    Relationship status: Not on file  Other Topics Concern  . Not on file  Social History Narrative  . Not on file    Outpatient Encounter Medications as of 02/09/2018  Medication Sig  . acetaminophen (TYLENOL) 650 MG CR tablet Take 1,300 mg by mouth 2 (two) times daily.  [name] allopurinol (ZYLOPRIM) 300 MG tablet TAKE 1 TABLET BY MOUTH DAILY  . azelastine (ASTELIN) 0.1 % nasal spray U 1 TO 2 SPRAYS IEN BID  . colchicine 0.6 MG tablet Take 1 tablet (0.6 mg total) by mouth 2 (two) times daily.  . cyanocobalamin (,VITAMIN B-12,) 1000 MCG/ML injection Inject 1 mL (1,000 mcg total)  into the muscle every 30 (thirty) days.  . DYMISTA 137-50 MCG/ACT SUSP SPRAY 1 SPRAY INTO EACH NOSTRIL BID  . furosemide (LASIX) 40 MG tablet Take 1 tablet (40 mg total) by mouth daily.  [name] levocetirizine (XYZAL) 5 MG tablet every evening.  [name] levothyroxine (SYNTHROID, LEVOTHROID) 137 MCG tablet TAKE ONE TABLET BY MOUTH ONCE DAILY BEFORE BREAKFAST (Patient taking differently: Take 137 mcg by mouth daily before breakfast. DAILY BEFORE BREAKFAST)  . metolazone (ZAROXOLYN)  5 MG tablet TK 1 T PO D 60 MINUTES PRIOR TO TAKING MAXIDE AND FUROSEMIDE  . montelukast (SINGULAIR) 10 MG tablet Take 10 mg by mouth daily.  . mupirocin ointment (BACTROBAN) 2 % Place 1 application into the nose 2 (two) times daily.  . pantoprazole (PROTONIX) 40 MG tablet TAKE 1 TABLET BY MOUTH EVERY DAY  . potassium chloride (K-DUR,KLOR-CON) 10 MEQ tablet TAKE 1 TABLET BY MOUTH twice daily  . SYRINGE-NEEDLE, DISP, 3 ML (SAFETY SYRINGE/NEEDLE) 23G X 1" 3 ML MISC Use to administer parenteral B12 supplementation.  . triamterene-hydrochlorothiazide (MAXZIDE-25) 37.5-25 MG tablet TAKE 1 TABLET BY MOUTH EVERY DAY  . triamterene-hydrochlorothiazide (MAXZIDE-25) 37.5-25 MG tablet TAKE 1 TABLET BY MOUTH EVERY DAY   Facility-Administered Encounter Medications as of 02/09/2018  Medication  . 0.9 %  sodium chloride infusion  . cyanocobalamin ((VITAMIN B-12)) injection 1,000 mcg    Review of Systems  Constitutional: Positive for fatigue. Negative for appetite change.  HENT: Negative for congestion and sinus pressure.   Respiratory: Negative for cough and chest tightness.        Gets out of breath with stairs.    Cardiovascular: Positive for leg swelling. Negative for chest pain and palpitations.  Gastrointestinal: Negative for abdominal pain, diarrhea, nausea and vomiting.  Genitourinary: Negative for difficulty urinating and dysuria.  Musculoskeletal: Negative for joint swelling and myalgias.  Skin: Negative for color change and rash.   Neurological: Negative for dizziness, light-headedness and headaches.  Psychiatric/Behavioral: Negative for agitation and dysphoric mood.       Objective:    Physical Exam  Constitutional: She appears well-developed and well-nourished. No distress.  HENT:  Nose: Nose normal.  Mouth/Throat: Oropharynx is clear and moist.  Neck: Neck supple. No thyromegaly present.  Cardiovascular: Normal rate and regular rhythm.  Pulmonary/Chest: Breath sounds normal. No respiratory distress. She has no wheezes.  Abdominal: Soft. Bowel sounds are normal. There is no tenderness.  Musculoskeletal: She exhibits no tenderness.  Some pedal and ankle edema - improved.    Lymphadenopathy:    She has no cervical adenopathy.  Skin: No rash noted. No erythema.  Psychiatric: She has a normal mood and affect. Her behavior is normal.    BP 128/80 (BP Location: Left Arm, Patient Position: Sitting, Cuff Size: Normal)   Pulse 80   Temp 98 F (36.7 C) (Oral)   Resp 18   Wt 245 lb (111.1 kg)   SpO2 98%   BMI 37.81 kg/m  Wt Readings from Last 3 Encounters:  02/09/18 245 lb (111.1 kg)  01/12/18 250 lb 3.2 oz (113.5 kg)  12/18/17 250 lb 12.8 oz (113.8 kg)     Lab Results  Component Value Date   WBC 9.9 01/27/2018   HGB 14.8 01/27/2018   HCT 44.8 01/27/2018   PLT 330 01/27/2018   GLUCOSE 155 (H) 09/23/2017   CHOL 202 (H) 04/10/2017   TRIG 265 (H) 04/10/2017   HDL 67 04/10/2017   LDLDIRECT 121.0 11/16/2014   LDLCALC 82 04/10/2017   ALT 17 09/12/2017   AST 10 09/12/2017   NA 138 09/23/2017   K 3.5 09/23/2017   CL 97 09/23/2017   CREATININE 0.67 09/23/2017   BUN 21 09/23/2017   CO2 29 09/23/2017   TSH 0.54 12/26/2016   INR 0.99 03/08/2016   HGBA1C 5.5 04/10/2017    Mr Foot Right W Wo Contrast  Result Date: 05/24/2017 CLINICAL DATA:  History of stepping on glass in November 4008 complicated by infection, status post incision  and drainage last week. EXAM: MRI OF THE RIGHT FOREFOOT WITHOUT AND  WITH CONTRAST TECHNIQUE: Multiplanar, multisequence MR imaging of the right forefoot was performed before and after the administration of intravenous contrast. CONTRAST:  7m MULTIHANCE GADOBENATE DIMEGLUMINE 529 MG/ML IV SOLN COMPARISON:  Right foot x-rays dated February 24, 2017. FINDINGS: Bones/Joint/Cartilage No suspicious marrow signal abnormality. Small focus of degenerative marrow edema in the cuboid at the fourth tarsometatarsal joint. No fracture or dislocation. Normal alignment. No joint effusion. Susceptibility artifact related to cerclage wire in the first proximal phalanx. Ligaments Collateral ligaments are intact.  Lisfranc ligament is intact. Muscles and Tendons Flexor, peroneal and extensor compartment tendons are intact. Prominent increased T2 signal and enhancement of the intrinsic forefoot muscles surrounding the mid to distal third metatarsal. Soft tissue Small area of ulceration at the plantar base of the third metatarsal head. No fluid collection or hematoma. No soft tissue mass. IMPRESSION: 1. Prominent edema and enhancement of the intrinsic forefoot muscles surrounding the mid to distal third metatarsal, nonspecific, but suspicious for myositis given clinical history. No drainable fluid collection. 2. Small ulceration at the plantar base of the third metatarsal head may represent the site of recent incision and drainage. Correlate clinically. 3. No evidence of osteomyelitis. Electronically Signed   By: WTitus DubinM.D.   On: 05/24/2017 10:29       Assessment & Plan:   Problem List Items Addressed This Visit    Anemia    Has been evaluated by hematology.  Previously received iron infusions.  Follow cbc and iron studies.        B12 deficiency    Continue b12 injections.        Bilateral lower extremity edema    Some better after metolazone.  Has been evaluated by vascular surgery.  Unable to wear compression hose.  F/u with vascular. Plan cardiology evaluation.         Relevant Orders   Ambulatory referral to Cardiology   BMI 37.0-37.9, adult    Discussed diet and exercise.  Follow.        Depression    Overall stable.        Essential hypertension, benign    Blood pressure under good control.  Continue same medication regimen.  Follow pressures.  Follow metabolic panel.        Relevant Medications   metolazone (ZAROXOLYN) 5 MG tablet   GERD (gastroesophageal reflux disease)    Controlled on current regimen.  Follow.        Hypercholesterolemia    Low cholesterol diet and exercise.  Follow lipid panel.        Relevant Medications   metolazone (ZAROXOLYN) 5 MG tablet   Hyperglycemia    Low carb diet and exercise.  Follow met b and a1c.        Hypothyroidism    On thyroid replacement.  Follow tsh.        Iron deficiency anemia following bariatric surgery    Evaluated by hematology.  Has previously received iron infusions.  Follow cbc and iron levels.        Melanoma in situ ([facility]    Followed by dermatology.         Other Visit Diagnoses    Burning sensation of feet    -  Primary   Relevant Orders   Ambulatory referral to Neurology   Pain in both lower extremities       Relevant Orders   Ambulatory referral to Neurology  SOB (shortness of breath) on exertion       Pt with sob with exertion and increased lower extremity swelling.  Seen in ER.  Had EKG.  Hold on repeat today.  Recommended f/u with cardiology.     Relevant Orders   Ambulatory referral to Cardiology       Einar Pheasant, MD

## 2018-02-10 ENCOUNTER — Encounter: Payer: Self-pay | Admitting: Internal Medicine

## 2018-02-10 DIAGNOSIS — Z9884 Bariatric surgery status: Secondary | ICD-10-CM | POA: Insufficient documentation

## 2018-02-10 DIAGNOSIS — Z872 Personal history of diseases of the skin and subcutaneous tissue: Secondary | ICD-10-CM | POA: Insufficient documentation

## 2018-02-10 DIAGNOSIS — Z8739 Personal history of other diseases of the musculoskeletal system and connective tissue: Secondary | ICD-10-CM | POA: Insufficient documentation

## 2018-02-15 ENCOUNTER — Encounter: Payer: Self-pay | Admitting: Internal Medicine

## 2018-02-15 NOTE — Assessment & Plan Note (Signed)
Has been evaluated by hematology.  Previously received iron infusions.  Follow cbc and iron studies.   

## 2018-02-15 NOTE — Assessment & Plan Note (Signed)
Followed by dermatology

## 2018-02-15 NOTE — Assessment & Plan Note (Signed)
Blood pressure under good control.  Continue same medication regimen.  Follow pressures.  Follow metabolic panel.   

## 2018-02-15 NOTE — Assessment & Plan Note (Signed)
Some better after metolazone.  Has been evaluated by vascular surgery.  Unable to wear compression hose.  F/u with vascular. Plan cardiology evaluation.

## 2018-02-15 NOTE — Assessment & Plan Note (Signed)
Overall stable.   

## 2018-02-15 NOTE — Assessment & Plan Note (Signed)
Evaluated by hematology.  Has previously received iron infusions.  Follow cbc and iron levels.

## 2018-02-15 NOTE — Assessment & Plan Note (Signed)
Low carb diet and exercise.  Follow met b and a1c.   

## 2018-02-15 NOTE — Assessment & Plan Note (Signed)
Controlled on current regimen.  Follow.  

## 2018-02-15 NOTE — Assessment & Plan Note (Signed)
On thyroid replacement.  Follow tsh.  

## 2018-02-15 NOTE — Assessment & Plan Note (Signed)
Continue b12 injections.  

## 2018-02-15 NOTE — Assessment & Plan Note (Signed)
Discussed diet and exercise.  Follow.  

## 2018-02-15 NOTE — Assessment & Plan Note (Signed)
Low cholesterol diet and exercise.  Follow lipid panel.   

## 2018-02-24 ENCOUNTER — Other Ambulatory Visit: Payer: Self-pay

## 2018-02-24 ENCOUNTER — Ambulatory Visit: Payer: Managed Care, Other (non HMO) | Attending: Internal Medicine

## 2018-02-24 DIAGNOSIS — M62830 Muscle spasm of back: Secondary | ICD-10-CM | POA: Diagnosis present

## 2018-02-24 DIAGNOSIS — M62838 Other muscle spasm: Secondary | ICD-10-CM | POA: Diagnosis not present

## 2018-02-24 DIAGNOSIS — R293 Abnormal posture: Secondary | ICD-10-CM | POA: Insufficient documentation

## 2018-02-24 DIAGNOSIS — N393 Stress incontinence (female) (male): Secondary | ICD-10-CM | POA: Diagnosis present

## 2018-02-24 DIAGNOSIS — M533 Sacrococcygeal disorders, not elsewhere classified: Secondary | ICD-10-CM | POA: Diagnosis present

## 2018-02-24 DIAGNOSIS — I890 Lymphedema, not elsewhere classified: Secondary | ICD-10-CM | POA: Diagnosis present

## 2018-02-24 NOTE — Therapy (Signed)
[place] MAIN [facility] SERVICES [address] [place], [place], [address] Phone: [phone]   Fax:  [phone]  Physical Therapy Evaluation  Patient Details  Name: Lori Le MRN: [id number] Date of Birth: 04/15/1965 Referring Provider (PT): Einar Pheasant   Encounter Date: 02/24/2018  PT End of Session - 02/25/18 0825    Visit Number  1    Number of Visits  24    Date for PT Re-Evaluation  04/08/18    Authorization - Visit Number  1    Authorization - Number of Visits  12    PT Start Time  1300    PT Stop Time  1400    PT Time Calculation (min)  60 min    Activity Tolerance  Patient tolerated treatment well    Behavior During Therapy  [facility] for tasks assessed/performed       Past Medical History:  Diagnosis Date  . Allergic rhinitis due to allergen   . Allergy   . Anemia   . Arthritis   . Cancer ([place])    skin ca  . Chronic idiopathic urticaria   . Complication of anesthesia    nausea, slow to wake up  . Depression   . GERD (gastroesophageal reflux disease)   . Gout   . Gout   . Hashimoto's thyroiditis   . Heart murmur   . History of eating disorder   . Hives   . Hyperlipidemia   . Hypertension   . Hypothyroidism   . Lower extremity edema   . Melanoma in situ ([place])    left shoulder  . Migraine   . Mitral valve disorder   . Motion sickness    all moving vehicles  . PCOS (polycystic ovarian syndrome)   . PONV (postoperative nausea and vomiting)   . Thyroid disease   . Ulcer   . Vertigo    last episode over 1 yr ago    Past Surgical History:  Procedure Laterality Date  . BREAST BIOPSY Right    neg- bx/clip  . CHOLECYSTECTOMY  2003  . DILITATION & CURRETTAGE/HYSTROSCOPY WITH NOVASURE ABLATION N/A 08/19/2016   Procedure: DILATATION & CURETTAGE/HYSTEROSCOPY WITH NOVASURE ABLATION;  Surgeon: [name], MD;  Location: [facility] ORS;  Service: Gynecology;  Laterality: N/A;  . ESOPHAGOGASTRODUODENOSCOPY (EGD) WITH  PROPOFOL N/A 12/05/2015   Procedure: ESOPHAGOGASTRODUODENOSCOPY (EGD) WITH PROPOFOL;  Surgeon: Lollie Sails, MD;  Location: [facility] ENDOSCOPY;  Service: Endoscopy;  Laterality: N/A;  . ESOPHAGOGASTRODUODENOSCOPY (EGD) WITH PROPOFOL N/A 02/06/2016   Procedure: ESOPHAGOGASTRODUODENOSCOPY (EGD) WITH PROPOFOL;  Surgeon: Lollie Sails, MD;  Location: [facility] ENDOSCOPY;  Service: Endoscopy;  Laterality: N/A;  . FOOT SURGERY  1994  . GASTRIC BYPASS  2011  . JOINT REPLACEMENT Left 09/27/2014   HIP  . JOINT REPLACEMENT Left 12/01/2014   KNEE  . KNEE ARTHROSCOPY Right   . KNEE ARTHROSCOPY Left 03/23/2015   Procedure: ARTHROSCOPY KNEE, partial synovectomy;  Surgeon: Hessie Knows, MD;  Location: [facility] ORS;  Service: Orthopedics;  Laterality: Left;  . LAPAROSCOPIC GASTRIC BANDING  2010  . ROTATOR CUFF REPAIR Bilateral    right shoulder 07-26-2016  . SHOULDER ARTHROSCOPY Left 06/20/2015   Procedure: ARTHROSCOPY SHOULDER, REPAIR OF MASSIVE ROTATOR CUFF TEAR, TENODESIS, DECOMPRESSION DEBRIDEMENT;  Surgeon: Corky Mull, MD;  Location: [facility] ORS;  Service: Orthopedics;  Laterality: Left;  . TOTAL HIP ARTHROPLASTY Left 09/27/2014   Procedure: TOTAL HIP ARTHROPLASTY ANTERIOR APPROACH;  Surgeon: Hessie Knows, MD;  Location: [facility] ORS;  Service: Orthopedics;  Laterality: Left;  . TOTAL KNEE ARTHROPLASTY Left 12/01/2014   Procedure: TOTAL KNEE ARTHROPLASTY;  Surgeon: Hessie Knows, MD;  Location: [facility] ORS;  Service: Orthopedics;  Laterality: Left;    There were no vitals filed for this visit.          Pelvic Floor Physical Therapy Evaluation and Assessment  SCREENING  Falls in last 6 mo: no    Patient's communication preference: Phone or mychart  Red Flags:  Have you had any night sweats? Yes, ~ 2 months ago. Unexplained weight loss? no Saddle anesthesia? no Unexplained changes in bowel or bladder habits? no  SUBJECTIVE  Patient reports: Has had some incontinence with stress for years but recently  it has worsened to having leakage with even climbing into the bed. Not using and pad or panty-liner, drops to mild wetness.   Precautions:  Hx of obesity and skin cancer  Social/Family/Vocational History:   Full time Project Analyst  Recent Procedures/Tests/Findings:  Has had recent increase in B LE edema and neuropathy in B LE and hands. none  Obstetrical History: none  Gynecological History: PCOS and mild abnormal paps  Urinary History: SUI starting "years" prior  Gastrointestinal History: Constipation/straining. Using Magnesium citrate and only going ~ every-other day. Consistency fluctuates significantly.  Sexual activity/pain: No pain  Location of pain: R hip  Current: 0/10 Max: 7/10 Low; 0/10. Sensation: strong ache  *Begins after 15 min. Of standing/walking.  Patient Goals: To not have incontinence and to be able to come off of medication and have regular bowel movements.   OBJECTIVE  Posture/Observations:  Sitting: forward leaning, resting on knees. Standing: R weight shift, B internally rotated humerus, L hip slightly high,  Weight shifted forward on toes, anterior pelvic tilt. R PSIS high.  Palpation/Segmental Motion/Joint Play: TTP to R PSIS and B Piriformis.  Special tests:   Stork: Positive on R for instability, L for decreased mobility. Leg-length: R LE is 1.5 CM shorter than L. Supine-to-long-sit: L long in both.  Range of Motion/Flexibilty:  Spine: 3 fingers from knee B, no pain, Forward fold: ~ 4 inches from floor with no back mobility  But tightness felt in hamstrings  Hips: Deferred to follow-up appointment.   Strength/MMT: Deferred to follow-up appointment LE MMT  LE MMT Left Right  Hip flex:  (L2) /5 /5  Hip ext: /5 /5  Hip abd: /5 /5  Hip add: /5 /5  Hip IR /5 /5  Hip ER /5 /5     Abdominal:  Palpation: TTP to B Iliacus and R>L Psoas and Pectineus. Diastasis: 8 finger Daistasis with SLR, 4 with head/shoulders  lifted.  Pelvic Floor External Exam: Deferred to follow-up appointment Introitus Appears:  Skin integrity:  Palpation: Cough: Prolapse visible?: Scar mobility:  Internal Vaginal Exam: Deferred to follow-up appointment Strength (PERF):  Symmetry: Palpation: Prolapse:   Internal Rectal Exam: Deferred until determined necessary. Strength (PERF): Symmetry: Palpation: Prolapse:   Gait Analysis: Vaulting over L LE, decreased hip ext. B.   Pelvic Floor Outcome Measures: PFDI:83/300, PFIQ: 70/300  INTERVENTIONS THIS SESSION: Self-care: Educated on the structure and function of the pelvic floor in relation to their symptoms as well as the POC, and initial HEP in order to set patient expectations and understanding from which we will build on in the future sessions.   Total time: 60 min.           Objective measurements completed on examination: See above findings.  PT Education - 02/25/18 0825    Education Details  See Interventions this session    Person(s) Educated  Patient    Methods  Explanation    Comprehension  Verbalized understanding       PT Short Term Goals - 02/25/18 0857      PT SHORT TERM GOAL #1   Title  Patient will demonstrate a coordinated contraction, relaxation, and bulge of the pelvic floor muscles to demonstrate functional recruitment and motion and allow for further strengthening.    Baseline  Pt. does not demonstrate appropriate recruitment/coordination or strength of PFM for decreasing SUI    Time  6    Period  Weeks    Status  New    Target Date  04/08/18      PT SHORT TERM GOAL #2   Title  Patient will demonstrate coordinated diaphragmatic breathing with pelvic tilts to demonstrate improved control of diaphragm and TA, to allow for further strengthening of core musculature and decreased pelvic floor spasm.    Baseline  Pt. lacks knowledge of how to coordinate core to engage PFM and support the abdomen to prevent  SUI    Time  6    Period  Weeks    Status  New    Target Date  04/08/18      PT SHORT TERM GOAL #3   Title  Patient will demonstrate improved sitting and standing posture to demonstrate learning and decrease stress on the pelvic floor with functional activity.    Baseline  Hyperlordotic    Time  6    Period  Weeks    Status  New    Target Date  04/08/18      PT SHORT TERM GOAL #4   Title  Patient will demonstrate improved pelvic alignment and balance of musculature surrounding the pelvis to facilitate decreased PFM spasms and decrease pelvic pain.    Baseline  LLE 1.5 cm long, spasms surrounding pelvis in R>L    Time  6    Period  Weeks    Status  New    Target Date  04/08/18      PT SHORT TERM GOAL #5   Title  Patient will demonstrate HEP x1 in the clinic to demonstrate understanding and proper form to allow for further improvement.    Baseline  Pt. lacks knowledge of therapeutic exercises that can help her reduce pain and incontinence.    Time  6    Period  Weeks    Status  New    Target Date  04/08/18        PT Long Term Goals - 02/24/18 1652      PT LONG TERM GOAL #1   Title  Patient will report no episodes of SUI over the course of the prior two weeks to demonstrate improved functional ability.    Baseline  Having small amounts leakage with cough, sneeze, bending, lifting, and even getting into/out of bed.    Time  12    Period  Weeks    Status  New    Target Date  05/20/18      PT LONG TERM GOAL #2   Title  Patient will score at or below 38/300 on the PFDI and 25/300 on the PFIQ to demonstrate a clinically meaningful decrease in disability and distress due to pelvic floor dysfunction.    Baseline  PFDI:83/300, PFIQ: 70/300    Time  12    Period  Weeks  Status  New    Target Date  05/20/17      PT LONG TERM GOAL #3   Title  Patient will describe pain no greater than 2/10 during standing and walking up to 45 min to demonstrate improved functional ability and  to allow her to participate in aerobic activity for health improvement and edema management.    Baseline  Pain increases to 8/10 with standing and walking>15 min.    Time  12    Period  Weeks    Status  New    Target Date  05/20/18      PT LONG TERM GOAL #4   Title  Pt will demo decreased abdominal separation from 8 fingers width to < 4 fingers width during SLR test in order to progress to higher functional exercise routines w/ decreased risk for injuries    Baseline  8 finger separation with SLR test, 4 with chin-tuck/crunch    Time  12    Period  Weeks    Status  New    Target Date  05/20/18      PT LONG TERM GOAL #5   Title  Patient will report having BM's at least every-other day with consistency between [facility] stool scale 3-5 over the prior week to demonstrate decreased constipation.    Baseline  Pt. having to strain, having irregular consistency and needing to use Magnezium citrate to have a BM every-other day.    Time  12    Period  Weeks    Status  New    Target Date  05/20/18             Plan - 02/24/18 1655    Clinical Impression Statement  Pt. is a 52 y/o Female who presents today with cheif c/o SUI that began years ago but has worsened over the past 6 months as well as pain in the R hip that increases with standing> 15 min which is limiting her ability to be active and positively impact her health. 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 Her clinical assessment reveals a leg-length discrepancy of 1.5 cm, decreased stability and balance with R LE SLS, decreased lumbar mobility in all directions, 8 finger diastasis with SLR, 4 finger separation with chin-tuck/crunch in hook-lying, hyperlordosis, anterior weight shift, and spasms surrounding the hips and pelvis on R>L. She  will benefit from skilled pelvic health PT to address the noted defecits and to continue to assess for and address other potential causes of her SUI and pain including through an internal pelvic exam when appropriate.    History and Personal Factors relevant to plan of care:  extensive medical and surgical history     Clinical Presentation  Evolving    Clinical Presentation due to:  Idiopathic edema of BLE and peripheral neuropathy, currently under review by medical team     Clinical Decision Making  High    Rehab Potential  Good    Clinical Impairments Affecting Rehab Potential  Obesity, leg-length discrepancy, Idopathic edema and peripheral neuropathy, extensive surgical and medical history, good attitude and motivation, appeaprs to be compliant with medical reccomendations     PT Frequency  2x / week    PT Duration  12 weeks    PT Treatment/Interventions  ADLs/Self Care Home Management;Aquatic Therapy;Biofeedback;Electrical Stimulation;Traction;Moist Heat;Gait training;Functional mobility  training;Therapeutic activities;Therapeutic exercise;Balance training;Patient/family education;Neuromuscular re-education;Manual techniques;Dry needling;Taping;Spinal Manipulations;Joint Manipulations    PT Next Visit Plan  Improve sacral mobility and alignment, discuss DN, give R heel-lift as appropriate. Internal PFM exam when appropriate.    Recommended Other Services  Lymphedema therapy to manage edema before it causes serious damage and to improve fluid processing to decrease SUI    Consulted and Agree with Plan of Care  Patient       Patient will benefit from skilled therapeutic intervention in order to improve the following deficits and impairments:  Abnormal gait, Increased fascial restricitons, Impaired sensation, Improper body mechanics, Pain, Decreased mobility, Decreased coordination, Increased muscle spasms, Postural dysfunction, Decreased activity tolerance, Decreased endurance, Decreased range of  motion, Decreased strength, Impaired UE functional use, Decreased balance, Difficulty walking, Increased edema, Impaired flexibility, Obesity  Visit Diagnosis: Other muscle spasm  Muscle spasm of back  Sacrococcygeal disorders, not elsewhere classified  Abnormal posture  SUI (stress urinary incontinence, female)     Problem List Patient Active Problem List   Diagnosis Date Noted  . Hot flashes 01/12/2018  . Urinary leakage 01/12/2018  . Chronic venous insufficiency 12/18/2017  . Lymphedema 12/18/2017  . Iron deficiency anemia following bariatric surgery 10/28/2017  . Bilateral lower extremity edema 10/07/2017  . Hyperglycemia 03/23/2017  . Pharyngitis due to Streptococcus species 12/18/2016  . Rotator cuff syndrome 04/24/2015  . Arthritis of knee, degenerative 04/24/2015  . Degenerative arthritis of hip 04/24/2015  . B12 deficiency 01/06/2015  . Primary osteoarthritis of knee 12/01/2014  . Anemia 11/22/2014  . Primary localized osteoarthritis of left hip 09/27/2014  . BMI 37.0-37.9, adult 03/20/2014  . Gout 03/20/2014  . Excessive and frequent menstruation 08/10/2013  . Derangement of posterior horn of medial meniscus 08/10/2013  . Mitral valve disease 08/10/2013  . Edema 12/30/2012  . Essential hypertension, benign 08/02/2012  . Melanoma in situ ([place]) 08/02/2012  . Depression 08/02/2012  . Environmental allergies 08/02/2012  . Hypercholesterolemia 08/02/2012  . Migraines 08/02/2012  . Hypothyroidism 08/02/2012  . PCOS (polycystic ovarian syndrome) 08/02/2012  . GERD (gastroesophageal reflux disease) 08/02/2012  . H/O gastric bypass 08/02/2012  . Food allergy 07/19/2011   Willa Rough DPT, ATC Willa Rough 02/25/2018, 9:23 AM  [place] MAIN [facility] SERVICES [address] [place], [place], [address] Phone: [phone]   Fax:  [phone]  Name: Lori Le MRN: [id number] Date of Birth: 1965-12-22

## 2018-02-25 ENCOUNTER — Ambulatory Visit: Payer: Managed Care, Other (non HMO) | Admitting: Occupational Therapy

## 2018-02-25 DIAGNOSIS — I890 Lymphedema, not elsewhere classified: Secondary | ICD-10-CM

## 2018-02-25 NOTE — Therapy (Signed)
[place] MAIN [facility] SERVICES [address] [place], [place], [address] Phone: [phone]   Fax:  [phone]  Patient Details  Name: Lori Le MRN: [id number] Date of Birth: [birth year] Referring Provider:  Einar Pheasant, MD  Encounter Date: 02/25/2018  PT/OT/SLP Screening Form   Time: in 11:05 AM     Time out 12:00 PM   Complaint : Chronic, progressive, BLE swelling and associated pain.  Past Medical Hx: Pt endorses positive family hx of chronic , progressive leg swelling in father. Pt reports BLE leg swelling onset in her mid 20's without known precipitating event.   Hx (this occurrence):  Pt does not numerically rate daily leg pain today, but describes it as intermittent "heaviness, fullness, tight, tired, sore". Pt reports leg swelling worsens as the day goes on. Standing, walking and extended sitting makes it worse, and elevation and rubbing makes it feel better. She reports swelling fluctuates, " and seems cyclical" sometimes". At present Pt is unable to fit preferred street shoes due to swelling in legs and feet. Pt's goal for screening is to identify how she can reduce leg swelling and keep it from getting worse.  Assessment: Pt presents with mild, stage II, BLE lymphedema 2/2 suspected hereditary component(Lymphedema Tarda)  and chronic venous insufficiency. Skin below knees is tight  And shiny with 1+ pitting distally, R>L, Generalized variscosities are observed below the knees. Fatty fibrosis is noted at [facility] bilaterally. Stemmer sign is positive. Creases at base of toes are minimal. Hemociderine stain is faint on L and mild on left at distal legs extending to feet. Mild redness is noted. Skin temperature is WNL. Pt denies hx of wounds/ ulcers and cellulitis. She states she has tolerated off-th-shelf compression garments in the past. Leg swelling and associated pain limits walking, standing tolerance and extended sitting in  dependent position at work. Leg swelling and pain limits basic and instrumental ADLs performance, leisure pursuits and productive activities, social participation and body image.   Recommendations:     [x]  Patient would benefit from an MD referral for Occupational Therapy lymphedema evaluation and treatment. []  No intervention recommended at this time.  Andrey Spearman, MS, OTR/L, [facility] 02/25/18 1:30 PM   [facility] MAIN [facility] SERVICES [place], [place], [address] Phone: [phone]   Fax:  [phone]

## 2018-03-03 ENCOUNTER — Ambulatory Visit: Payer: Managed Care, Other (non HMO)

## 2018-03-03 NOTE — Progress Notes (Unsigned)
1 

## 2018-03-06 ENCOUNTER — Telehealth: Payer: Self-pay

## 2018-03-06 NOTE — Telephone Encounter (Signed)
Called patient to let her know that her paper work has been completed but need to clarify start and end date. Also, patient needs to have her potassium rechecked.

## 2018-03-09 ENCOUNTER — Ambulatory Visit: Payer: Managed Care, Other (non HMO) | Attending: Internal Medicine

## 2018-03-09 DIAGNOSIS — R293 Abnormal posture: Secondary | ICD-10-CM

## 2018-03-09 DIAGNOSIS — M62838 Other muscle spasm: Secondary | ICD-10-CM | POA: Diagnosis present

## 2018-03-09 DIAGNOSIS — M533 Sacrococcygeal disorders, not elsewhere classified: Secondary | ICD-10-CM

## 2018-03-09 DIAGNOSIS — M62830 Muscle spasm of back: Secondary | ICD-10-CM | POA: Diagnosis present

## 2018-03-09 DIAGNOSIS — N393 Stress incontinence (female) (male): Secondary | ICD-10-CM | POA: Diagnosis present

## 2018-03-09 NOTE — Patient Instructions (Addendum)
Urge supression technique:  1) Take a deep breath to convince yourself that you are in control and calm the nervous system.  2) Do 5 "quick-flick" kegels (pelvic floor muscle contractions) and re-assess the urge. Repeat another set if urge is still present. 3) Once the urge has decreased, start walking calmly to the the bathroom. Stop and repeat steps 1 and 2 as many times as needed until you can successfully get to the toilet. 4) Only once seated, take a deep breath and allow the pelvic floor muscles to relax and allow for the urine to flow.    Do not be discouraged if you are not successful the first couple times, this is normal and it will take practice but remember that YOU are in control. Start by practicing this at home where you do not have to worry as much if there were to be an accident. Allowing yourself to get rushed or nervous puts the bladder back in control and will not allow the technique to work.    Gently push into the strap for 5 seconds and then release and take up the slack. Repeat this 5 times then hold for 5 deep breaths. Do this 1-2 times per day. LEFT SIDE ONLY  * Wear for 1 hour first day, increase by 1 hour each day if your body allows you. If pain spikes, back off a little and if you need you can wait until our next session.

## 2018-03-09 NOTE — Therapy (Addendum)
[place] MAIN [facility] SERVICES [address] [place], [place], [address] Phone: [phone]   Fax:  [phone]  Physical Therapy Treatment  Patient Details  Name: Lori Le MRN: [id number] Date of Birth: [birth year] Referring Provider (PT): Einar Pheasant   Encounter Date: 03/09/2018  PT End of Session - 03/09/18 1435    Visit Number  2    Number of Visits  24    Date for PT Re-Evaluation  04/08/18    Authorization - Visit Number  2    Authorization - Number of Visits  12    PT Start Time  9381    PT Stop Time  8299    PT Time Calculation (min)  60 min    Activity Tolerance  Patient tolerated treatment well;Other (comment)   Pt. had mild dizziness and nausea following treatment but it decreased with breathing and water.   Behavior During Therapy  [facility] for tasks assessed/performed       Past Medical History:  Diagnosis Date  . Allergic rhinitis due to allergen   . Allergy   . Anemia   . Arthritis   . Cancer ([place])    skin ca  . Chronic idiopathic urticaria   . Complication of anesthesia    nausea, slow to wake up  . Depression   . GERD (gastroesophageal reflux disease)   . Gout   . Gout   . Hashimoto's thyroiditis   . Heart murmur   . History of eating disorder   . Hives   . Hyperlipidemia   . Hypertension   . Hypothyroidism   . Lower extremity edema   . Melanoma in situ ([place])    left shoulder  . Migraine   . Mitral valve disorder   . Motion sickness    all moving vehicles  . PCOS (polycystic ovarian syndrome)   . PONV (postoperative nausea and vomiting)   . Thyroid disease   . Ulcer   . Vertigo    last episode over 1 yr ago    Past Surgical History:  Procedure Laterality Date  . BREAST BIOPSY Right    neg- bx/clip  . CHOLECYSTECTOMY  2003  . DILITATION & CURRETTAGE/HYSTROSCOPY WITH NOVASURE ABLATION N/A 08/19/2016   Procedure: DILATATION & CURETTAGE/HYSTEROSCOPY WITH NOVASURE ABLATION;  Surgeon: [name], MD;  Location: [facility] ORS;  Service: Gynecology;  Laterality: N/A;  . ESOPHAGOGASTRODUODENOSCOPY (EGD) WITH PROPOFOL N/A 12/05/2015   Procedure: ESOPHAGOGASTRODUODENOSCOPY (EGD) WITH PROPOFOL;  Surgeon: Lollie Sails, MD;  Location: [facility] ENDOSCOPY;  Service: Endoscopy;  Laterality: N/A;  . ESOPHAGOGASTRODUODENOSCOPY (EGD) WITH PROPOFOL N/A 02/06/2016   Procedure: ESOPHAGOGASTRODUODENOSCOPY (EGD) WITH PROPOFOL;  Surgeon: Lollie Sails, MD;  Location: [facility] ENDOSCOPY;  Service: Endoscopy;  Laterality: N/A;  . FOOT SURGERY  1994  . GASTRIC BYPASS  2011  . JOINT REPLACEMENT Left 09/27/2014   HIP  . JOINT REPLACEMENT Left 12/01/2014   KNEE  . KNEE ARTHROSCOPY Right   . KNEE ARTHROSCOPY Left 03/23/2015   Procedure: ARTHROSCOPY KNEE, partial synovectomy;  Surgeon: Hessie Knows, MD;  Location: [facility] ORS;  Service: Orthopedics;  Laterality: Left;  . LAPAROSCOPIC GASTRIC BANDING  2010  . ROTATOR CUFF REPAIR Bilateral    right shoulder 07-26-2016  . SHOULDER ARTHROSCOPY Left 06/20/2015   Procedure: ARTHROSCOPY SHOULDER, REPAIR OF MASSIVE ROTATOR CUFF TEAR, TENODESIS, DECOMPRESSION DEBRIDEMENT;  Surgeon: Corky Mull, MD;  Location: [facility] ORS;  Service: Orthopedics;  Laterality: Left;  . TOTAL HIP ARTHROPLASTY Left 09/27/2014  Procedure: TOTAL HIP ARTHROPLASTY ANTERIOR APPROACH;  Surgeon: Hessie Knows, MD;  Location: [facility] ORS;  Service: Orthopedics;  Laterality: Left;  . TOTAL KNEE ARTHROPLASTY Left 12/01/2014   Procedure: TOTAL KNEE ARTHROPLASTY;  Surgeon: Hessie Knows, MD;  Location: [facility] ORS;  Service: Orthopedics;  Laterality: Left;    There were no vitals filed for this visit.      Pelvic Floor Physical Therapy Treatment Note  SCREENING  Changes in medications, allergies, or medical history?: none    SUBJECTIVE  Patient reports: She has not noticed any changes but is really tired of the leakage, had an episode of full incontinence the other day coming home from the grocery store.    Precautions:  Lymphedema  Pain update: Location of pain: R hip  Current: 0/10 Max: 7/10 Low; 0/10. Sensation: strong ache  Patient Goals: To not have incontinence and to be able to come off of medication and have regular bowel movements.   OBJECTIVE  Changes in: Posture/Observations: L up-slip, RLE is shorter.   Range of Motion/Flexibilty:  Lacking ~ 10 degrees of hip ext on L. Decreased mobility through R>L sacral borders.  Palpation: TTP to L Lumbar multifidus, paraspinals, QL and Glute max/min.  Gait Analysis: vaulting over L LE, slight forward lean  INTERVENTIONS THIS SESSION: Manual: performed grade 3-4 PA mobs to all sacral borders and STM and TP to L Lumbar multifidus, paraspinals, QL and Glute max/min. To decrease pain and spasm and allow for improved acceptance of heel-lift to decrease tension on lumbosacral nerve roots for improved recruitment and coordination of PFM and decreased UI. Self-care: educated on urge-suppression technique and given a heel-lift and educated on how to gradually increase wear time to decrease UI. Dry needle: Performed TPDN with a .30x72mm needle and standard approach to L Lumbar multifidus, paraspinals, QL and Glute max/min. To decrease pain and spasm and allow for improved acceptance of heel-lift to decrease tension on lumbosacral nerve roots for improved recruitment and coordination of PFM and decreased UI. Therex: Educated on and practiced hold-relax and stretch of L hip-flexors to improve balance of hip musculature surrounding pelvis for decreased pressure on lumbosacral nerve roots.   Total time: 60 min.                  Trigger Point Dry Needling - 03/09/18 1434    Consent Given?  Yes    Education Handout Provided  No    Muscles Treated Upper Body  Quadratus Lumborum;Longissimus   lumbar multifidus   Muscles Treated Lower Body  Gluteus maximus;Gluteus minimus    Longissimus Response  Twitch response  elicited;Palpable increased muscle length    Gluteus Maximus Response  Twitch response elicited;Palpable increased muscle length    Gluteus Minimus Response  Twitch response elicited;Palpable increased muscle length           PT Education - 03/09/18 1435    Education Details  See Pt. Instructions and Interventions this session.    Person(s) Educated  Patient    Methods  Explanation;Demonstration;Verbal cues;Handout    Comprehension  Verbalized understanding;Returned demonstration;Verbal cues required       PT Short Term Goals - 02/25/18 0857      PT SHORT TERM GOAL #1   Title  Patient will demonstrate a coordinated contraction, relaxation, and bulge of the pelvic floor muscles to demonstrate functional recruitment and motion and allow for further strengthening.    Baseline  Pt. does not demonstrate appropriate recruitment/coordination or strength of PFM for decreasing SUI  Time  6    Period  Weeks    Status  New    Target Date  04/08/18      PT SHORT TERM GOAL #2   Title  Patient will demonstrate coordinated diaphragmatic breathing with pelvic tilts to demonstrate improved control of diaphragm and TA, to allow for further strengthening of core musculature and decreased pelvic floor spasm.    Baseline  Pt. lacks knowledge of how to coordinate core to engage PFM and support the abdomen to prevent SUI    Time  6    Period  Weeks    Status  New    Target Date  04/08/18      PT SHORT TERM GOAL #3   Title  Patient will demonstrate improved sitting and standing posture to demonstrate learning and decrease stress on the pelvic floor with functional activity.    Baseline  Hyperlordotic    Time  6    Period  Weeks    Status  New    Target Date  04/08/18      PT SHORT TERM GOAL #4   Title  Patient will demonstrate improved pelvic alignment and balance of musculature surrounding the pelvis to facilitate decreased PFM spasms and decrease pelvic pain.    Baseline  LLE 1.5 cm long,  spasms surrounding pelvis in R>L    Time  6    Period  Weeks    Status  New    Target Date  04/08/18      PT SHORT TERM GOAL #5   Title  Patient will demonstrate HEP x1 in the clinic to demonstrate understanding and proper form to allow for further improvement.    Baseline  Pt. lacks knowledge of therapeutic exercises that can help her reduce pain and incontinence.    Time  6    Period  Weeks    Status  New    Target Date  04/08/18        PT Long Term Goals - 02/24/18 1652      PT LONG TERM GOAL #1   Title  Patient will report no episodes of SUI over the course of the prior two weeks to demonstrate improved functional ability.    Baseline  Having small amounts leakage with cough, sneeze, bending, lifting, and even getting into/out of bed.    Time  12    Period  Weeks    Status  New    Target Date  05/20/18      PT LONG TERM GOAL #2   Title  Patient will score at or below 38/300 on the PFDI and 25/300 on the PFIQ to demonstrate a clinically meaningful decrease in disability and distress due to pelvic floor dysfunction.    Baseline  PFDI:83/300, PFIQ: 70/300    Time  12    Period  Weeks    Status  New    Target Date  05/20/17      PT LONG TERM GOAL #3   Title  Patient will describe pain no greater than 2/10 during standing and walking up to 45 min to demonstrate improved functional ability and to allow her to participate in aerobic activity for health improvement and edema management.    Baseline  Pain increases to 8/10 with standing and walking>15 min.    Time  12    Period  Weeks    Status  New    Target Date  05/20/18      PT LONG TERM  GOAL #4   Title  Pt will demo decreased abdominal separation from 8 fingers width to < 4 fingers width during SLR test in order to progress to higher functional exercise routines w/ decreased risk for injuries    Baseline  8 finger separation with SLR test, 4 with chin-tuck/crunch    Time  12    Period  Weeks    Status  New    Target  Date  05/20/18      PT LONG TERM GOAL #5   Title  Patient will report having BM's at least every-other day with consistency between [facility] stool scale 3-5 over the prior week to demonstrate decreased constipation.    Baseline  Pt. having to strain, having irregular consistency and needing to use Magnezium citrate to have a BM every-other day.    Time  12    Period  Weeks    Status  New    Target Date  05/20/18            Plan - 03/09/18 1437    Clinical Impression Statement  Pt. responded well to all interventions today, demonstrating decreased spasms and pain surrounding L hip and understanding of all education and new exercise given. Contnue per POC.     Clinical Presentation  Evolving    Clinical Decision Making  High    Rehab Potential  Good    Clinical Impairments Affecting Rehab Potential  Obesity, leg-length discrepancy, Idopathic edema and peripheral neuropathy, extensive surgical and medical history, good attitude and motivation, appeaprs to be compliant with medical reccomendations     PT Frequency  2x / week    PT Duration  12 weeks    PT Treatment/Interventions  ADLs/Self Care Home Management;Aquatic Therapy;Biofeedback;Electrical Stimulation;Traction;Moist Heat;Gait training;Functional mobility training;Therapeutic activities;Therapeutic exercise;Balance training;Patient/family education;Neuromuscular re-education;Manual techniques;Dry needling;Taping;Spinal Manipulations;Joint Manipulations    PT Next Visit Plan  DN to L Iliopsoas and othe hip and back muscles PRN. Internal PFM exam when appropriate.    Consulted and Agree with Plan of Care  Patient       Patient will benefit from skilled therapeutic intervention in order to improve the following deficits and impairments:  Abnormal gait, Increased fascial restricitons, Impaired sensation, Improper body mechanics, Pain, Decreased mobility, Decreased coordination, Increased muscle spasms, Postural dysfunction, Decreased  activity tolerance, Decreased endurance, Decreased range of motion, Decreased strength, Impaired UE functional use, Decreased balance, Difficulty walking, Increased edema, Impaired flexibility, Obesity  Visit Diagnosis: Other muscle spasm  Muscle spasm of back  Sacrococcygeal disorders, not elsewhere classified  Abnormal posture  SUI (stress urinary incontinence, female)     Problem List Patient Active Problem List   Diagnosis Date Noted  . Hot flashes 01/12/2018  . Urinary leakage 01/12/2018  . Chronic venous insufficiency 12/18/2017  . Lymphedema 12/18/2017  . Iron deficiency anemia following bariatric surgery 10/28/2017  . Bilateral lower extremity edema 10/07/2017  . Hyperglycemia 03/23/2017  . Pharyngitis due to Streptococcus species 12/18/2016  . Rotator cuff syndrome 04/24/2015  . Arthritis of knee, degenerative 04/24/2015  . Degenerative arthritis of hip 04/24/2015  . B12 deficiency 01/06/2015  . Primary osteoarthritis of knee 12/01/2014  . Anemia 11/22/2014  . Primary localized osteoarthritis of left hip 09/27/2014  . BMI 37.0-37.9, adult 03/20/2014  . Gout 03/20/2014  . Excessive and frequent menstruation 08/10/2013  . Derangement of posterior horn of medial meniscus 08/10/2013  . Mitral valve disease 08/10/2013  . Edema 12/30/2012  . Essential hypertension, benign 08/02/2012  . Melanoma in situ ([place])  08/02/2012  . Depression 08/02/2012  . Environmental allergies 08/02/2012  . Hypercholesterolemia 08/02/2012  . Migraines 08/02/2012  . Hypothyroidism 08/02/2012  . PCOS (polycystic ovarian syndrome) 08/02/2012  . GERD (gastroesophageal reflux disease) 08/02/2012  . H/O gastric bypass 08/02/2012  . Food allergy 07/19/2011   Willa Rough DPT, ATC Willa Rough 03/09/2018, 2:47 PM  [place] MAIN [facility] SERVICES [address] [place], [place], [address] Phone: [phone]   Fax:  [phone]  Name: Lori Le MRN: [id number] Date of Birth: 1965/08/24

## 2018-03-14 ENCOUNTER — Other Ambulatory Visit: Payer: Self-pay | Admitting: Internal Medicine

## 2018-03-16 ENCOUNTER — Encounter (HOSPITAL_COMMUNITY): Payer: Self-pay | Admitting: Psychiatry

## 2018-03-16 ENCOUNTER — Ambulatory Visit (INDEPENDENT_AMBULATORY_CARE_PROVIDER_SITE_OTHER): Payer: 59 | Admitting: Psychiatry

## 2018-03-16 VITALS — BP 106/72 | HR 78 | Ht 67.5 in | Wt 246.0 lb

## 2018-03-16 DIAGNOSIS — F411 Generalized anxiety disorder: Secondary | ICD-10-CM | POA: Diagnosis not present

## 2018-03-16 DIAGNOSIS — F330 Major depressive disorder, recurrent, mild: Secondary | ICD-10-CM | POA: Diagnosis not present

## 2018-03-16 MED ORDER — SERTRALINE HCL 50 MG PO TABS: 50.0000 mg | ORAL_TABLET | Freq: Every day | ORAL | 1 refills | Status: DC
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
## 2018-03-17 ENCOUNTER — Ambulatory Visit: Payer: Managed Care, Other (non HMO)

## 2018-03-19 ENCOUNTER — Other Ambulatory Visit: Payer: Self-pay | Admitting: Internal Medicine

## 2018-03-23 ENCOUNTER — Ambulatory Visit: Payer: Managed Care, Other (non HMO)

## 2018-03-23 ENCOUNTER — Other Ambulatory Visit: Payer: Self-pay | Admitting: Internal Medicine

## 2018-03-31 ENCOUNTER — Ambulatory Visit: Payer: Managed Care, Other (non HMO)

## 2018-03-31 DIAGNOSIS — M533 Sacrococcygeal disorders, not elsewhere classified: Secondary | ICD-10-CM

## 2018-03-31 DIAGNOSIS — R293 Abnormal posture: Secondary | ICD-10-CM

## 2018-03-31 DIAGNOSIS — N393 Stress incontinence (female) (male): Secondary | ICD-10-CM

## 2018-03-31 DIAGNOSIS — M62830 Muscle spasm of back: Secondary | ICD-10-CM

## 2018-03-31 DIAGNOSIS — M62838 Other muscle spasm: Secondary | ICD-10-CM

## 2018-03-31 NOTE — Therapy (Addendum)
[place] MAIN [facility] SERVICES [address] [place], [place], [address] Phone: [phone]   Fax:  [phone]  Physical Therapy Treatment  Patient Details  Name: Lori Le MRN: [id number] Date of Birth: 18-Oct-1965 Referring Provider (PT): Einar Pheasant   Encounter Date: 03/31/2018  PT End of Session - 04/20/18 0755    Visit Number  3    Number of Visits  24    Date for PT Re-Evaluation  04/08/18    Authorization - Visit Number  3    Authorization - Number of Visits  12    PT Start Time  1400    PT Stop Time  1500    PT Time Calculation (min)  60 min    Activity Tolerance  Patient tolerated treatment well;Other (comment)    Behavior During Therapy  WFL for tasks assessed/performed       Past Medical History:  Diagnosis Date  . Allergic rhinitis due to allergen   . Allergy   . Anemia   . Arthritis   . Cancer ([place])    skin ca  . Chronic idiopathic urticaria   . Complication of anesthesia    nausea, slow to wake up  . Depression   . GERD (gastroesophageal reflux disease)   . Gout   . Gout   . Hashimoto's thyroiditis   . Heart murmur   . History of eating disorder   . Hives   . Hyperlipidemia   . Hypertension   . Hypothyroidism   . Lower extremity edema   . Melanoma in situ ([place])    left shoulder  . Migraine   . Mitral valve disorder   . Motion sickness    all moving vehicles  . PCOS (polycystic ovarian syndrome)   . PONV (postoperative nausea and vomiting)   . Thyroid disease   . Ulcer   . Vertigo    last episode over 1 yr ago    Past Surgical History:  Procedure Laterality Date  . BREAST BIOPSY Right    neg- bx/clip  . CHOLECYSTECTOMY  2003  . DILITATION & CURRETTAGE/HYSTROSCOPY WITH NOVASURE ABLATION N/A 08/19/2016   Procedure: DILATATION & CURETTAGE/HYSTEROSCOPY WITH NOVASURE ABLATION;  Surgeon: [name], MD;  Location: [facility] ORS;  Service: Gynecology;  Laterality: N/A;  .  ESOPHAGOGASTRODUODENOSCOPY (EGD) WITH PROPOFOL N/A 12/05/2015   Procedure: ESOPHAGOGASTRODUODENOSCOPY (EGD) WITH PROPOFOL;  Surgeon: Lollie Sails, MD;  Location: [facility] ENDOSCOPY;  Service: Endoscopy;  Laterality: N/A;  . ESOPHAGOGASTRODUODENOSCOPY (EGD) WITH PROPOFOL N/A 02/06/2016   Procedure: ESOPHAGOGASTRODUODENOSCOPY (EGD) WITH PROPOFOL;  Surgeon: Lollie Sails, MD;  Location: [facility] ENDOSCOPY;  Service: Endoscopy;  Laterality: N/A;  . FOOT SURGERY  1994  . GASTRIC BYPASS  2011  . JOINT REPLACEMENT Left 09/27/2014   HIP  . JOINT REPLACEMENT Left 12/01/2014   KNEE  . KNEE ARTHROSCOPY Right   . KNEE ARTHROSCOPY Left 03/23/2015   Procedure: ARTHROSCOPY KNEE, partial synovectomy;  Surgeon: Hessie Knows, MD;  Location: [facility] ORS;  Service: Orthopedics;  Laterality: Left;  . LAPAROSCOPIC GASTRIC BANDING  2010  . ROTATOR CUFF REPAIR Bilateral    right shoulder 07-26-2016  . SHOULDER ARTHROSCOPY Left 06/20/2015   Procedure: ARTHROSCOPY SHOULDER, REPAIR OF MASSIVE ROTATOR CUFF TEAR, TENODESIS, DECOMPRESSION DEBRIDEMENT;  Surgeon: Corky Mull, MD;  Location: [facility] ORS;  Service: Orthopedics;  Laterality: Left;  . TOTAL HIP ARTHROPLASTY Left 09/27/2014   Procedure: TOTAL HIP ARTHROPLASTY ANTERIOR APPROACH;  Surgeon: Hessie Knows, MD;  Location: [facility] ORS;  Service: Orthopedics;  Laterality: Left;  . TOTAL KNEE ARTHROPLASTY Left 12/01/2014   Procedure: TOTAL KNEE ARTHROPLASTY;  Surgeon: Hessie Knows, MD;  Location: [facility] ORS;  Service: Orthopedics;  Laterality: Left;    There were no vitals filed for this visit.    Pelvic Floor Physical Therapy Treatment Note  SCREENING  Changes in medications, allergies, or medical history?:  no   SUBJECTIVE  Patient reports: Feels like she is sliding off the side of the heel lift. Has been able to use the urge-suppression technique successfully most of the time, has occasional leakage with stress.   Precautions:  lymphedema  Pain update:  Location of  pain: LB and R hip Current pain:  0/10  Max pain:  4/10 Least pain:  0/10 Nature of pain: strong ache  Patient Goals: To not have incontinence and to be able to come off of medication and have regular bowel movements.   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  Palpation: TTP to B adductors and Psoas.  Gait Analysis: L>R over-pronation with weightbearing  INTERVENTIONS THIS SESSION: Theract: replaced 2 layers of heel-lift with lateral wedge to address calcaneovarus and allow pt. To AMB with a more normal gait pattern to decrease muscular imbalance and improve ability to have PFM maintain improvement. Therex: educated on and practiced clam-shells, hip IR stretch in prone and hip IR stretch in supine. Manual: Performed TP release to B adductors and Psoas distally to decrease pain and tension acting on the pubic bone to decrease referred pain into PFM.  Total time: 54 min.                            PT Short Term Goals - 02/25/18 0857      PT SHORT TERM GOAL #1   Title  Patient will demonstrate a coordinated contraction, relaxation, and bulge of the pelvic floor muscles to demonstrate functional recruitment and motion and allow for further strengthening.    Baseline  Pt. does not demonstrate appropriate recruitment/coordination or strength of PFM for decreasing SUI    Time  6    Period  Weeks    Status  New    Target Date  04/08/18      PT SHORT TERM GOAL #2   Title  Patient will demonstrate coordinated diaphragmatic breathing with pelvic tilts to demonstrate improved control of diaphragm and TA, to allow for further strengthening of core musculature and decreased pelvic floor spasm.    Baseline  Pt. lacks knowledge of how to coordinate core to engage PFM and support the abdomen to prevent SUI    Time  6    Period   Weeks    Status  New    Target Date  04/08/18      PT SHORT TERM GOAL #3   Title  Patient will demonstrate improved sitting and standing posture to demonstrate learning and decrease stress on the pelvic floor with functional activity.    Baseline  Hyperlordotic    Time  6    Period  Weeks    Status  New    Target Date  04/08/18      PT SHORT TERM GOAL #4   Title  Patient will demonstrate improved pelvic alignment and balance of musculature surrounding the pelvis to facilitate decreased PFM spasms and decrease pelvic pain.  Baseline  LLE 1.5 cm long, spasms surrounding pelvis in R>L    Time  6    Period  Weeks    Status  New    Target Date  04/08/18      PT SHORT TERM GOAL #5   Title  Patient will demonstrate HEP x1 in the clinic to demonstrate understanding and proper form to allow for further improvement.    Baseline  Pt. lacks knowledge of therapeutic exercises that can help her reduce pain and incontinence.    Time  6    Period  Weeks    Status  New    Target Date  04/08/18        PT Long Term Goals - 02/24/18 1652      PT LONG TERM GOAL #1   Title  Patient will report no episodes of SUI over the course of the prior two weeks to demonstrate improved functional ability.    Baseline  Having small amounts leakage with cough, sneeze, bending, lifting, and even getting into/out of bed.    Time  12    Period  Weeks    Status  New    Target Date  05/20/18      PT LONG TERM GOAL #2   Title  Patient will score at or below 38/300 on the PFDI and 25/300 on the PFIQ to demonstrate a clinically meaningful decrease in disability and distress due to pelvic floor dysfunction.    Baseline  PFDI:83/300, PFIQ: 70/300    Time  12    Period  Weeks    Status  New    Target Date  05/20/17      PT LONG TERM GOAL #3   Title  Patient will describe pain no greater than 2/10 during standing and walking up to 45 min to demonstrate improved functional ability and to allow her to participate  in aerobic activity for health improvement and edema management.    Baseline  Pain increases to 8/10 with standing and walking>15 min.    Time  12    Period  Weeks    Status  New    Target Date  05/20/18      PT LONG TERM GOAL #4   Title  Pt will demo decreased abdominal separation from 8 fingers width to < 4 fingers width during SLR test in order to progress to higher functional exercise routines w/ decreased risk for injuries    Baseline  8 finger separation with SLR test, 4 with chin-tuck/crunch    Time  12    Period  Weeks    Status  New    Target Date  05/20/18      PT LONG TERM GOAL #5   Title  Patient will report having BM's at least every-other day with consistency between [facility] stool scale 3-5 over the prior week to demonstrate decreased constipation.    Baseline  Pt. having to strain, having irregular consistency and needing to use Magnezium citrate to have a BM every-other day.    Time  12    Period  Weeks    Status  New    Target Date  05/20/18            Plan - 04/20/18 0757    Clinical Impression Statement  Pt. responded well to all interventions this session, demonstrated improved gait pattern and decreased spasm and tenderness. Continue per POC.    Clinical Presentation  Evolving    Clinical Decision Making  High    Rehab Potential  Good    Clinical Impairments Affecting Rehab Potential  Obesity, leg-length discrepancy, Idopathic edema and peripheral neuropathy, extensive surgical and medical history, good attitude and motivation, appeaprs to be compliant with medical reccomendations     PT Frequency  2x / week    PT Duration  12 weeks    PT Treatment/Interventions  ADLs/Self Care Home Management;Aquatic Therapy;Biofeedback;Electrical Stimulation;Traction;Moist Heat;Gait training;Functional mobility training;Therapeutic activities;Therapeutic exercise;Balance training;Patient/family education;Neuromuscular re-education;Manual techniques;Dry  needling;Taping;Spinal Manipulations;Joint Manipulations    PT Next Visit Plan  DN to adductors and HS on long-leg. Educate on internal release with tool, work on recruitment and coordination of PFM. Biofeedback if necessary eventually    Recommended Other Services  Lymphedema therapy    Consulted and Agree with Plan of Care  Patient       Patient will benefit from skilled therapeutic intervention in order to improve the following deficits and impairments:  Abnormal gait, Increased fascial restricitons, Impaired sensation, Improper body mechanics, Pain, Decreased mobility, Decreased coordination, Increased muscle spasms, Postural dysfunction, Decreased activity tolerance, Decreased endurance, Decreased range of motion, Decreased strength, Impaired UE functional use, Decreased balance, Difficulty walking, Increased edema, Impaired flexibility, Obesity  Visit Diagnosis: Other muscle spasm  Muscle spasm of back  Sacrococcygeal disorders, not elsewhere classified  Abnormal posture  SUI (stress urinary incontinence, female)     Problem List Patient Active Problem List   Diagnosis Date Noted  . Hot flashes 01/12/2018  . Urinary leakage 01/12/2018  . Chronic venous insufficiency 12/18/2017  . Lymphedema 12/18/2017  . Iron deficiency anemia following bariatric surgery 10/28/2017  . Bilateral lower extremity edema 10/07/2017  . Hyperglycemia 03/23/2017  . Pharyngitis due to Streptococcus species 12/18/2016  . Rotator cuff syndrome 04/24/2015  . Arthritis of knee, degenerative 04/24/2015  . Degenerative arthritis of hip 04/24/2015  . B12 deficiency 01/06/2015  . Primary osteoarthritis of knee 12/01/2014  . Anemia 11/22/2014  . Primary localized osteoarthritis of left hip 09/27/2014  . BMI 37.0-37.9, adult 03/20/2014  . Gout 03/20/2014  . Excessive and frequent menstruation 08/10/2013  . Derangement of posterior horn of medial meniscus 08/10/2013  . Mitral valve disease 08/10/2013   . Edema 12/30/2012  . Essential hypertension, benign 08/02/2012  . Melanoma in situ ([place]) 08/02/2012  . Depression 08/02/2012  . Environmental allergies 08/02/2012  . Hypercholesterolemia 08/02/2012  . Migraines 08/02/2012  . Hypothyroidism 08/02/2012  . PCOS (polycystic ovarian syndrome) 08/02/2012  . GERD (gastroesophageal reflux disease) 08/02/2012  . H/O gastric bypass 08/02/2012  . Food allergy 07/19/2011   Willa Rough DPT, ATC Willa Rough 04/20/2018, 8:10 AM  [place] MAIN [facility] SERVICES [address] [place], [place], [address] Phone: [phone]   Fax:  [phone]  Name: Lori Le MRN: [id number] Date of Birth: [birth year]

## 2018-04-14 ENCOUNTER — Ambulatory Visit: Payer: Managed Care, Other (non HMO) | Attending: Internal Medicine

## 2018-04-14 DIAGNOSIS — N393 Stress incontinence (female) (male): Secondary | ICD-10-CM | POA: Diagnosis present

## 2018-04-14 DIAGNOSIS — R293 Abnormal posture: Secondary | ICD-10-CM | POA: Diagnosis present

## 2018-04-14 DIAGNOSIS — M62830 Muscle spasm of back: Secondary | ICD-10-CM | POA: Diagnosis present

## 2018-04-14 DIAGNOSIS — M62838 Other muscle spasm: Secondary | ICD-10-CM

## 2018-04-14 DIAGNOSIS — M533 Sacrococcygeal disorders, not elsewhere classified: Secondary | ICD-10-CM | POA: Diagnosis present

## 2018-04-14 NOTE — Therapy (Signed)
[place] MAIN [facility] SERVICES [address] [place], [place], [address] Phone: [phone]   Fax:  [phone]  Physical Therapy Treatment  Patient Details  Name: Lori Le MRN: [id number] Date of Birth: 08-14-1965 Referring Provider (PT): Einar Pheasant   Encounter Date: 04/14/2018  PT End of Session - 04/16/18 1835    Visit Number  4    Number of Visits  24    Date for PT Re-Evaluation  04/08/18    Authorization - Visit Number  4    Authorization - Number of Visits  12    PT Start Time  1400    PT Stop Time  1500    PT Time Calculation (min)  60 min    Activity Tolerance  Patient tolerated treatment well;Other (comment)    Behavior During Therapy  WFL for tasks assessed/performed       Past Medical History:  Diagnosis Date  . Allergic rhinitis due to allergen   . Allergy   . Anemia   . Arthritis   . Cancer ([place])    skin ca  . Chronic idiopathic urticaria   . Complication of anesthesia    nausea, slow to wake up  . Depression   . GERD (gastroesophageal reflux disease)   . Gout   . Gout   . Hashimoto's thyroiditis   . Heart murmur   . History of eating disorder   . Hives   . Hyperlipidemia   . Hypertension   . Hypothyroidism   . Lower extremity edema   . Melanoma in situ ([place])    left shoulder  . Migraine   . Mitral valve disorder   . Motion sickness    all moving vehicles  . PCOS (polycystic ovarian syndrome)   . PONV (postoperative nausea and vomiting)   . Thyroid disease   . Ulcer   . Vertigo    last episode over 1 yr ago    Past Surgical History:  Procedure Laterality Date  . BREAST BIOPSY Right    neg- bx/clip  . CHOLECYSTECTOMY  2003  . DILITATION & CURRETTAGE/HYSTROSCOPY WITH NOVASURE ABLATION N/A 08/19/2016   Procedure: DILATATION & CURETTAGE/HYSTEROSCOPY WITH NOVASURE ABLATION;  Surgeon: [name], MD;  Location: [facility] ORS;  Service: Gynecology;  Laterality: N/A;  . ESOPHAGOGASTRODUODENOSCOPY  (EGD) WITH PROPOFOL N/A 12/05/2015   Procedure: ESOPHAGOGASTRODUODENOSCOPY (EGD) WITH PROPOFOL;  Surgeon: Lollie Sails, MD;  Location: [facility] ENDOSCOPY;  Service: Endoscopy;  Laterality: N/A;  . ESOPHAGOGASTRODUODENOSCOPY (EGD) WITH PROPOFOL N/A 02/06/2016   Procedure: ESOPHAGOGASTRODUODENOSCOPY (EGD) WITH PROPOFOL;  Surgeon: Lollie Sails, MD;  Location: [facility] ENDOSCOPY;  Service: Endoscopy;  Laterality: N/A;  . FOOT SURGERY  1994  . GASTRIC BYPASS  2011  . JOINT REPLACEMENT Left 09/27/2014   HIP  . JOINT REPLACEMENT Left 12/01/2014   KNEE  . KNEE ARTHROSCOPY Right   . KNEE ARTHROSCOPY Left 03/23/2015   Procedure: ARTHROSCOPY KNEE, partial synovectomy;  Surgeon: Hessie Knows, MD;  Location: [facility] ORS;  Service: Orthopedics;  Laterality: Left;  . LAPAROSCOPIC GASTRIC BANDING  2010  . ROTATOR CUFF REPAIR Bilateral    right shoulder 07-26-2016  . SHOULDER ARTHROSCOPY Left 06/20/2015   Procedure: ARTHROSCOPY SHOULDER, REPAIR OF MASSIVE ROTATOR CUFF TEAR, TENODESIS, DECOMPRESSION DEBRIDEMENT;  Surgeon: Corky Mull, MD;  Location: [facility] ORS;  Service: Orthopedics;  Laterality: Left;  . TOTAL HIP ARTHROPLASTY Left 09/27/2014   Procedure: TOTAL HIP ARTHROPLASTY ANTERIOR APPROACH;  Surgeon: Hessie Knows, MD;  Location: [facility] ORS;  Service: Orthopedics;  Laterality: Left;  . TOTAL KNEE ARTHROPLASTY Left 12/01/2014   Procedure: TOTAL KNEE ARTHROPLASTY;  Surgeon: Hessie Knows, MD;  Location: [facility] ORS;  Service: Orthopedics;  Laterality: Left;    There were no vitals filed for this visit.     Pelvic Floor Physical Therapy Treatment Note  SCREENING  Changes in medications, allergies, or medical history?: none    SUBJECTIVE  Patient reports: Has some red itchy welts on her head that do not seem to want to let. Has had a few moments where she got a strong urge    Precautions:  lymphedema  Pain update: No pain.  Patient Goals: To not have incontinence and to be able to come off of  medication and have regular bowel movements.   OBJECTIVE  Changes in:  Pelvic floor: External Exam:  Introitus Appears: normal Skin integrity: normal Palpation: no TTP Cough: paradoxical Prolapse visible?: no Scar mobility: N/A  Internal Vaginal Exam:  Strength (PERF): 3/5, 3 sec., 2 times   Symmetry: greater tenderness on R?>L and posteriorly>anteriorly. Palpation: TTP through all muscles internally Prolapse: none   INTERVENTIONS THIS SESSION: Manual: Performed TP release to OI, Anterior PR/PC and coccygeus B to decrease pain and spasm and allow for improved ROM of muscle to achieve greater strengthening and coordination success.  Therex: Educated on 3-way wall stretch to decrease tension acting on and within the Pelvis.  Self-care: educated on where to purchase tool for self internal TP release and to allow for fully treating coccygeus due to depth of tissue. Educated on [employer] and how it works to decrease constipation and straining as well as where to purchase it.   Total time: 60 min.                           PT Education - 04/16/18 1834    Education Details  See Pt. Instructions and Interventions this session.    Person(s) Educated  Patient    Methods  Explanation;Demonstration;Verbal cues;Handout    Comprehension  Verbalized understanding;Verbal cues required       PT Short Term Goals - 02/25/18 0857      PT SHORT TERM GOAL #1   Title  Patient will demonstrate a coordinated contraction, relaxation, and bulge of the pelvic floor muscles to demonstrate functional recruitment and motion and allow for further strengthening.    Baseline  Pt. does not demonstrate appropriate recruitment/coordination or strength of PFM for decreasing SUI    Time  6    Period  Weeks    Status  New    Target Date  04/08/18      PT SHORT TERM GOAL #2   Title  Patient will demonstrate coordinated diaphragmatic breathing with pelvic tilts to demonstrate  improved control of diaphragm and TA, to allow for further strengthening of core musculature and decreased pelvic floor spasm.    Baseline  Pt. lacks knowledge of how to coordinate core to engage PFM and support the abdomen to prevent SUI    Time  6    Period  Weeks    Status  New    Target Date  04/08/18      PT SHORT TERM GOAL #3   Title  Patient will demonstrate improved sitting and standing posture to demonstrate learning and decrease stress on the pelvic floor with functional activity.    Baseline  Hyperlordotic    Time  6    Period  Weeks  Status  New    Target Date  04/08/18      PT SHORT TERM GOAL #4   Title  Patient will demonstrate improved pelvic alignment and balance of musculature surrounding the pelvis to facilitate decreased PFM spasms and decrease pelvic pain.    Baseline  LLE 1.5 cm long, spasms surrounding pelvis in R>L    Time  6    Period  Weeks    Status  New    Target Date  04/08/18      PT SHORT TERM GOAL #5   Title  Patient will demonstrate HEP x1 in the clinic to demonstrate understanding and proper form to allow for further improvement.    Baseline  Pt. lacks knowledge of therapeutic exercises that can help her reduce pain and incontinence.    Time  6    Period  Weeks    Status  New    Target Date  04/08/18        PT Long Term Goals - 02/24/18 1652      PT LONG TERM GOAL #1   Title  Patient will report no episodes of SUI over the course of the prior two weeks to demonstrate improved functional ability.    Baseline  Having small amounts leakage with cough, sneeze, bending, lifting, and even getting into/out of bed.    Time  12    Period  Weeks    Status  New    Target Date  05/20/18      PT LONG TERM GOAL #2   Title  Patient will score at or below 38/300 on the PFDI and 25/300 on the PFIQ to demonstrate a clinically meaningful decrease in disability and distress due to pelvic floor dysfunction.    Baseline  PFDI:83/300, PFIQ: 70/300    Time   12    Period  Weeks    Status  New    Target Date  05/20/17      PT LONG TERM GOAL #3   Title  Patient will describe pain no greater than 2/10 during standing and walking up to 45 min to demonstrate improved functional ability and to allow her to participate in aerobic activity for health improvement and edema management.    Baseline  Pain increases to 8/10 with standing and walking>15 min.    Time  12    Period  Weeks    Status  New    Target Date  05/20/18      PT LONG TERM GOAL #4   Title  Pt will demo decreased abdominal separation from 8 fingers width to < 4 fingers width during SLR test in order to progress to higher functional exercise routines w/ decreased risk for injuries    Baseline  8 finger separation with SLR test, 4 with chin-tuck/crunch    Time  12    Period  Weeks    Status  New    Target Date  05/20/18      PT LONG TERM GOAL #5   Title  Patient will report having BM's at least every-other day with consistency between [facility] stool scale 3-5 over the prior week to demonstrate decreased constipation.    Baseline  Pt. having to strain, having irregular consistency and needing to use Magnezium citrate to have a BM every-other day.    Time  12    Period  Weeks    Status  New    Target Date  05/20/18  Plan - 04/16/18 1836    Clinical Impression Statement  Pt. responded well to all interventions today, demonstrating decreased pain and spasm as well understanding of all education and exercises provided.     Clinical Presentation  Evolving    Clinical Decision Making  High    Rehab Potential  Good    Clinical Impairments Affecting Rehab Potential  Obesity, leg-length discrepancy, Idopathic edema and peripheral neuropathy, extensive surgical and medical history, good attitude and motivation, appeaprs to be compliant with medical reccomendations     PT Frequency  2x / week    PT Duration  12 weeks    PT Treatment/Interventions  ADLs/Self Care Home  Management;Aquatic Therapy;Biofeedback;Electrical Stimulation;Traction;Moist Heat;Gait training;Functional mobility training;Therapeutic activities;Therapeutic exercise;Balance training;Patient/family education;Neuromuscular re-education;Manual techniques;Dry needling;Taping;Spinal Manipulations;Joint Manipulations    PT Next Visit Plan  DN to adductors and HS on long-leg. Educate on internal release with tool, work on recruitment and coordination of PFM. Biofeedback if necessary eventually    Consulted and Agree with Plan of Care  Patient       Patient will benefit from skilled therapeutic intervention in order to improve the following deficits and impairments:  Abnormal gait, Increased fascial restricitons, Impaired sensation, Improper body mechanics, Pain, Decreased mobility, Decreased coordination, Increased muscle spasms, Postural dysfunction, Decreased activity tolerance, Decreased endurance, Decreased range of motion, Decreased strength, Impaired UE functional use, Decreased balance, Difficulty walking, Increased edema, Impaired flexibility, Obesity  Visit Diagnosis: Other muscle spasm  Muscle spasm of back  Sacrococcygeal disorders, not elsewhere classified  Abnormal posture  SUI (stress urinary incontinence, female)     Problem List Patient Active Problem List   Diagnosis Date Noted  . Hot flashes 01/12/2018  . Urinary leakage 01/12/2018  . Chronic venous insufficiency 12/18/2017  . Lymphedema 12/18/2017  . Iron deficiency anemia following bariatric surgery 10/28/2017  . Bilateral lower extremity edema 10/07/2017  . Hyperglycemia 03/23/2017  . Pharyngitis due to Streptococcus species 12/18/2016  . Rotator cuff syndrome 04/24/2015  . Arthritis of knee, degenerative 04/24/2015  . Degenerative arthritis of hip 04/24/2015  . B12 deficiency 01/06/2015  . Primary osteoarthritis of knee 12/01/2014  . Anemia 11/22/2014  . Primary localized osteoarthritis of left hip 09/27/2014   . BMI 37.0-37.9, adult 03/20/2014  . Gout 03/20/2014  . Excessive and frequent menstruation 08/10/2013  . Derangement of posterior horn of medial meniscus 08/10/2013  . Mitral valve disease 08/10/2013  . Edema 12/30/2012  . Essential hypertension, benign 08/02/2012  . Melanoma in situ ([place]) 08/02/2012  . Depression 08/02/2012  . Environmental allergies 08/02/2012  . Hypercholesterolemia 08/02/2012  . Migraines 08/02/2012  . Hypothyroidism 08/02/2012  . PCOS (polycystic ovarian syndrome) 08/02/2012  . GERD (gastroesophageal reflux disease) 08/02/2012  . H/O gastric bypass 08/02/2012  . Food allergy 07/19/2011   Willa Rough DPT, ATC Willa Rough 04/16/2018, 6:40 PM  [place] MAIN [facility] SERVICES [address] [place], [place], [address] Phone: [phone]   Fax:  [phone]  Name: Lori Le MRN: [id number] Date of Birth: [birth year]

## 2018-04-14 NOTE — Patient Instructions (Signed)
    Female version: Dr. Kathline Magic Premium Prostate Massager      3-Way Wall Stretches for Pelvic Floor Lengthening   Bring bottom close to the wall and gently press straighten knees to feel a stretch down the back of you thighs. Hold while taking 5 deep belly breaths and feeling the pelvic floor relax and lower on each inhale.    Let your legs fall to the side to feel a stretch on the inside of your thighs. If the stretch is too intense you can use a pillow to take some of the weight off by wedging it on the outside of your hips. Hold while taking 5 deep belly breaths and feeling the pelvic floor relax and lower on each inhale.    Slide feet down the wall and move hips slightly away from the wall and then let your knees fall to the sides so you feel a stretch on the inside of the thighs near your groin. Hold while taking 5 deep belly breaths and feeling the pelvic floor relax and lower on each inhale.     *Perform each stretch in sequence 3 times, once a day

## 2018-04-16 ENCOUNTER — Ambulatory Visit: Payer: Self-pay | Admitting: Internal Medicine

## 2018-04-16 ENCOUNTER — Encounter

## 2018-04-20 ENCOUNTER — Other Ambulatory Visit: Payer: Self-pay

## 2018-04-20 ENCOUNTER — Telehealth: Payer: Self-pay

## 2018-04-20 DIAGNOSIS — Z1211 Encounter for screening for malignant neoplasm of colon: Secondary | ICD-10-CM

## 2018-04-20 DIAGNOSIS — R208 Other disturbances of skin sensation: Secondary | ICD-10-CM | POA: Insufficient documentation

## 2018-04-20 DIAGNOSIS — R202 Paresthesia of skin: Secondary | ICD-10-CM | POA: Insufficient documentation

## 2018-04-20 DIAGNOSIS — M79604 Pain in right leg: Secondary | ICD-10-CM | POA: Insufficient documentation

## 2018-04-20 DIAGNOSIS — R200 Anesthesia of skin: Secondary | ICD-10-CM | POA: Insufficient documentation

## 2018-04-20 DIAGNOSIS — M79605 Pain in left leg: Secondary | ICD-10-CM | POA: Insufficient documentation

## 2018-04-20 MED ORDER — PEG 3350-KCL-NABCB-NACL-NASULF 236 G PO SOLR: 4000.0000 mL | Freq: Once | ORAL | 0 refills | Status: AC

## 2018-04-20 NOTE — Telephone Encounter (Signed)
Colonoscopy scheduled for 05/11/18 at [facility] with Dr. Bonna Gains.  2 day prep will be done. Golytely sent to pharmacy.  Thanks [employer]

## 2018-04-20 NOTE — Telephone Encounter (Signed)
Pt is calling to schedule a colonoscopy  °

## 2018-04-21 ENCOUNTER — Telehealth: Payer: Self-pay

## 2018-04-21 ENCOUNTER — Other Ambulatory Visit: Payer: Self-pay

## 2018-04-21 DIAGNOSIS — Z1211 Encounter for screening for malignant neoplasm of colon: Secondary | ICD-10-CM

## 2018-04-21 NOTE — Telephone Encounter (Signed)
Contacted patient to confirm date/ location preference for her colonoscopy.  She said she preferred Monday 05/11/18 explained to her that Dr. Bonna Gains was not available at [facility] on Monday's that she was available at [facility].  Pt has agreed that [facility] Monday 05/11/18 would be best due to her work schedule.  Placed order in for [facility] and Canceled [place].  Thanks [employer]

## 2018-04-22 ENCOUNTER — Other Ambulatory Visit: Payer: Self-pay | Admitting: Internal Medicine

## 2018-04-24 ENCOUNTER — Encounter (INDEPENDENT_AMBULATORY_CARE_PROVIDER_SITE_OTHER): Payer: Managed Care, Other (non HMO)

## 2018-04-24 ENCOUNTER — Ambulatory Visit (INDEPENDENT_AMBULATORY_CARE_PROVIDER_SITE_OTHER): Payer: Managed Care, Other (non HMO) | Admitting: Vascular Surgery

## 2018-04-30 ENCOUNTER — Inpatient Hospital Stay: Payer: Managed Care, Other (non HMO)

## 2018-05-01 ENCOUNTER — Inpatient Hospital Stay: Payer: Managed Care, Other (non HMO)

## 2018-05-01 ENCOUNTER — Inpatient Hospital Stay: Payer: Managed Care, Other (non HMO) | Admitting: Hematology and Oncology

## 2018-05-04 ENCOUNTER — Ambulatory Visit: Payer: Managed Care, Other (non HMO) | Admitting: Occupational Therapy

## 2018-05-07 ENCOUNTER — Ambulatory Visit: Payer: Managed Care, Other (non HMO) | Admitting: Gastroenterology

## 2018-05-07 ENCOUNTER — Telehealth: Payer: Self-pay

## 2018-05-07 ENCOUNTER — Encounter: Payer: Managed Care, Other (non HMO) | Admitting: Obstetrics and Gynecology

## 2018-05-07 NOTE — Telephone Encounter (Signed)
Pt came by office today but did not see provider. EGD scheduled for 2/10 with Colonoscopy and may temporarily increase Protonix to 2x day to aid with epigastric pain.

## 2018-05-07 NOTE — Telephone Encounter (Signed)
Patient contacted office to see if we can add an EGD to her screening colonoscopy she has scheduled with you  on 02/10.  I informed her that you would probably prefer an office visit to address the need for EGD.  She informed me that she has been experiencing epigastric pain, sharp and semi-constant.  Discussed with Jackelyn Poling and an appt has been made for her for today at 2:15 to discuss her epigastric pain and request for EGD.  She is currently scheduled for her screening colonoscopy on 05/11/18 with you at [facility].  Thanks [employer]

## 2018-05-08 ENCOUNTER — Encounter: Payer: Self-pay | Admitting: Emergency Medicine

## 2018-05-08 ENCOUNTER — Telehealth: Payer: Self-pay | Admitting: Gastroenterology

## 2018-05-08 MED ORDER — PEG 3350-KCL-NA BICARB-NACL 420 G PO SOLR: 4000.0000 mL | Freq: Once | ORAL | 0 refills | Status: AC

## 2018-05-08 NOTE — Telephone Encounter (Signed)
Patient called & l/m on v/m stating she has a colonoscopy on Monday 05-11-2018 with Dr Adolphus Birchwood. She states that last time she had to take two day prep so she was cleaned out & this time she only received one. Please call.

## 2018-05-08 NOTE — Telephone Encounter (Signed)
Left message that we have a sample of prep (suprep) if she would like to come by office today and pick this up. Was informed that we do close today at 12 noon. I will send in a prescription also in case she does not make it to the office in time, since her procedure is Monday. Instructions also given to do one prep on Saturday and the other on Sunday.

## 2018-05-09 ENCOUNTER — Other Ambulatory Visit: Payer: Self-pay | Admitting: Urgent Care

## 2018-05-10 ENCOUNTER — Other Ambulatory Visit: Payer: Self-pay | Admitting: Urgent Care

## 2018-05-11 ENCOUNTER — Ambulatory Visit: Admit: 2018-05-11 | Payer: Managed Care, Other (non HMO) | Admitting: Gastroenterology

## 2018-05-11 ENCOUNTER — Ambulatory Visit
Admission: RE | Admit: 2018-05-11 | Discharge: 2018-05-11 | Disposition: A | Discharge status: Home / Self Care | Payer: Managed Care, Other (non HMO) | Attending: Gastroenterology | Admitting: Gastroenterology

## 2018-05-11 ENCOUNTER — Encounter
Admission: RE | Disposition: A | Discharge status: Home / Self Care | Payer: Self-pay | Source: Home / Self Care | Attending: Gastroenterology

## 2018-05-11 ENCOUNTER — Encounter: Payer: Self-pay | Admitting: Anesthesiology

## 2018-05-11 ENCOUNTER — Ambulatory Visit: Payer: Managed Care, Other (non HMO) | Admitting: Anesthesiology

## 2018-05-11 DIAGNOSIS — D509 Iron deficiency anemia, unspecified: Secondary | ICD-10-CM | POA: Diagnosis not present

## 2018-05-11 DIAGNOSIS — D123 Benign neoplasm of transverse colon: Secondary | ICD-10-CM | POA: Diagnosis not present

## 2018-05-11 DIAGNOSIS — G43909 Migraine, unspecified, not intractable, without status migrainosus: Secondary | ICD-10-CM | POA: Diagnosis not present

## 2018-05-11 DIAGNOSIS — M1990 Unspecified osteoarthritis, unspecified site: Secondary | ICD-10-CM | POA: Diagnosis not present

## 2018-05-11 DIAGNOSIS — K289 Gastrojejunal ulcer, unspecified as acute or chronic, without hemorrhage or perforation: Secondary | ICD-10-CM | POA: Diagnosis not present

## 2018-05-11 DIAGNOSIS — F329 Major depressive disorder, single episode, unspecified: Secondary | ICD-10-CM | POA: Insufficient documentation

## 2018-05-11 DIAGNOSIS — K228 Other specified diseases of esophagus: Secondary | ICD-10-CM | POA: Diagnosis not present

## 2018-05-11 DIAGNOSIS — K259 Gastric ulcer, unspecified as acute or chronic, without hemorrhage or perforation: Secondary | ICD-10-CM | POA: Diagnosis not present

## 2018-05-11 DIAGNOSIS — E282 Polycystic ovarian syndrome: Secondary | ICD-10-CM | POA: Diagnosis not present

## 2018-05-11 DIAGNOSIS — E063 Autoimmune thyroiditis: Secondary | ICD-10-CM | POA: Diagnosis not present

## 2018-05-11 DIAGNOSIS — Z1211 Encounter for screening for malignant neoplasm of colon: Secondary | ICD-10-CM | POA: Insufficient documentation

## 2018-05-11 DIAGNOSIS — K2289 Other specified disease of esophagus: Secondary | ICD-10-CM

## 2018-05-11 DIAGNOSIS — I10 Essential (primary) hypertension: Secondary | ICD-10-CM | POA: Insufficient documentation

## 2018-05-11 DIAGNOSIS — Z79899 Other long term (current) drug therapy: Secondary | ICD-10-CM | POA: Diagnosis not present

## 2018-05-11 DIAGNOSIS — D120 Benign neoplasm of cecum: Secondary | ICD-10-CM | POA: Diagnosis not present

## 2018-05-11 DIAGNOSIS — E785 Hyperlipidemia, unspecified: Secondary | ICD-10-CM | POA: Diagnosis not present

## 2018-05-11 DIAGNOSIS — Z9884 Bariatric surgery status: Secondary | ICD-10-CM | POA: Diagnosis not present

## 2018-05-11 DIAGNOSIS — Z86006 Personal history of melanoma in-situ: Secondary | ICD-10-CM | POA: Diagnosis not present

## 2018-05-11 DIAGNOSIS — Z79890 Hormone replacement therapy (postmenopausal): Secondary | ICD-10-CM | POA: Insufficient documentation

## 2018-05-11 DIAGNOSIS — K219 Gastro-esophageal reflux disease without esophagitis: Secondary | ICD-10-CM | POA: Insufficient documentation

## 2018-05-11 DIAGNOSIS — Z0000 Encounter for general adult medical examination without abnormal findings: Secondary | ICD-10-CM

## 2018-05-11 DIAGNOSIS — M109 Gout, unspecified: Secondary | ICD-10-CM | POA: Insufficient documentation

## 2018-05-11 HISTORY — PX: ESOPHAGOGASTRODUODENOSCOPY (EGD) WITH PROPOFOL: SHX5813

## 2018-05-11 HISTORY — PX: COLONOSCOPY WITH PROPOFOL: SHX5780

## 2018-05-11 LAB — POCT PREGNANCY, URINE: Preg Test, Ur: NEGATIVE

## 2018-05-11 SURGERY — COLONOSCOPY WITH PROPOFOL
Anesthesia: General

## 2018-05-11 MED ORDER — SODIUM CHLORIDE 0.9 % IV SOLN
INTRAVENOUS | Status: DC
  Administered 2018-05-11: 1000 mL via INTRAVENOUS

## 2018-05-11 MED ORDER — EPHEDRINE SULFATE 50 MG/ML IJ SOLN
INTRAMUSCULAR | Status: DC | PRN
  Administered 2018-05-11: 5 mg via INTRAVENOUS
  Administered 2018-05-11 (×4): 10 mg via INTRAVENOUS

## 2018-05-11 MED ORDER — LIDOCAINE HCL (PF) 2 % IJ SOLN
INTRAMUSCULAR | Status: AC
  Filled 2018-05-11: qty 10

## 2018-05-11 MED ORDER — PANTOPRAZOLE SODIUM 40 MG PO TBEC: 40.0000 mg | DELAYED_RELEASE_TABLET | Freq: Two times a day (BID) | ORAL | 1 refills | Status: DC

## 2018-05-11 MED ORDER — PROPOFOL 500 MG/50ML IV EMUL
INTRAVENOUS | Status: AC
  Filled 2018-05-11: qty 50

## 2018-05-11 MED ORDER — FENTANYL CITRATE (PF) 100 MCG/2ML IJ SOLN
INTRAMUSCULAR | Status: DC | PRN
  Administered 2018-05-11 (×3): 50 ug via INTRAVENOUS

## 2018-05-11 MED ORDER — MIDAZOLAM HCL 2 MG/2ML IJ SOLN
INTRAMUSCULAR | Status: DC | PRN
  Administered 2018-05-11: 2 mg via INTRAVENOUS

## 2018-05-11 MED ORDER — FENTANYL CITRATE (PF) 100 MCG/2ML IJ SOLN
INTRAMUSCULAR | Status: AC
  Filled 2018-05-11: qty 2

## 2018-05-11 MED ORDER — PROPOFOL 500 MG/50ML IV EMUL
INTRAVENOUS | Status: DC | PRN
  Administered 2018-05-11: 120 ug/kg/min via INTRAVENOUS

## 2018-05-11 MED ORDER — PROPOFOL 10 MG/ML IV BOLUS
INTRAVENOUS | Status: AC
  Filled 2018-05-11: qty 40

## 2018-05-11 MED ORDER — MIDAZOLAM HCL 2 MG/2ML IJ SOLN
INTRAMUSCULAR | Status: AC
  Filled 2018-05-11: qty 2

## 2018-05-11 MED ORDER — SUCRALFATE 1 GM/10ML PO SUSP: 1.0000 g | Freq: Four times a day (QID) | ORAL | 1 refills | Status: DC

## 2018-05-11 MED ORDER — LIDOCAINE HCL (CARDIAC) PF 100 MG/5ML IV SOSY
PREFILLED_SYRINGE | INTRAVENOUS | Status: DC | PRN
  Administered 2018-05-11: 30 mg via INTRAVENOUS

## 2018-05-11 NOTE — Op Note (Signed)
[facility] Gastroenterology Patient Name: Stephen Turnbaugh Procedure Date: 05/11/2018 1:39 PM MRN: [id number] Account #: [id number] Date of Birth: 1965/04/22 Admit Type: Outpatient Age: 53 Room: [facility] ENDO ROOM 4 Gender: Female Note Status: Finalized Procedure:            Colonoscopy Indications:          Screening for colorectal malignant neoplasm Providers:            Veronica Fretz B. Bonna Gains MD, MD Referring MD:         Einar Pheasant, MD (Referring MD) Medicines:            Monitored Anesthesia Care Complications:        No immediate complications. Procedure:            Pre-Anesthesia Assessment:                       - ASA Grade Assessment: II - A patient with mild                        systemic disease.                       - Prior to the procedure, a History and Physical was                        performed, and patient medications, allergies and                        sensitivities were reviewed. The patient's tolerance of                        previous anesthesia was reviewed.                       - The risks and benefits of the procedure and the                        sedation options and risks were discussed with the                        patient. All questions were answered and informed                        consent was obtained.                       - Patient identification and proposed procedure were                        verified prior to the procedure by the physician, the                        nurse, the anesthesiologist, the anesthetist and the                        technician. The procedure was verified in the procedure                        room.  After obtaining informed consent, the colonoscope was                        passed under direct vision. Throughout the procedure,                        the patient's blood pressure, pulse, and oxygen                        saturations were monitored continuously. The                  Colonoscope was introduced through the anus and                        advanced to the the cecum, identified by appendiceal                        orifice and ileocecal valve. The colonoscopy was                        performed with ease. The patient tolerated the                        procedure well. The quality of the bowel preparation                        was good. Findings:      The perianal and digital rectal examinations were normal.      A 10 mm polyp was found in the cecum. The polyp was sessile. The polyp       was removed with a hot snare. Resection and retrieval were complete. To       prevent bleeding after the polypectomy, one hemostatic clip was       successfully placed. There was no bleeding at the end of the procedure.      A 3 mm polyp was found in the transverse colon. The polyp was sessile.       The polyp was removed with a cold biopsy forceps. Resection and       retrieval were complete.      A 7 mm polyp was found in the transverse colon. The polyp was sessile.       Polypectomy was attempted, initially using a cold snare. Polyp resection       was incomplete with this device. This intervention then required a       different device and polypectomy technique. The polyp was removed with a       hot snare. Resection and retrieval were complete.      A 6 mm polyp was found in the transverse colon. The polyp was sessile.       The polyp was removed with a cold snare. Resection and retrieval were       complete.      The exam was otherwise without abnormality.      The rectum, sigmoid colon, descending colon, transverse colon, ascending       colon and cecum appeared normal.      The retroflexed view of the distal rectum and anal verge was normal and       showed no anal or rectal abnormalities. Impression:           - One  10 mm polyp in the cecum, removed with a hot                        snare. Resected and retrieved. Clip was placed.                        - One 3 mm polyp in the transverse colon, removed with                        a cold biopsy forceps. Resected and retrieved.                       - One 7 mm polyp in the transverse colon, removed with                        a hot snare. Resected and retrieved.                       - One 6 mm polyp in the transverse colon, removed with                        a cold snare. Resected and retrieved.                       - The examination was otherwise normal.                       - The rectum, sigmoid colon, descending colon,                        transverse colon, ascending colon and cecum are normal.                       - The distal rectum and anal verge are normal on                        retroflexion view. Recommendation:       - Discharge patient to home (with escort).                       - Advance diet as tolerated.                       - Continue present medications.                       - Await pathology results.                       - Repeat colonoscopy in 3 years, with 2 day prep.                       - The findings and recommendations were discussed with                        the patient.                       - The findings and recommendations were discussed with  the patient's family.                       - Return to primary care physician as previously                        scheduled. Procedure Code(s):    --- Professional ---                       [phone], Colonoscopy, flexible; with removal of tumor(s),                        polyp(s), or other lesion(s) by snare technique                       45380, 72, Colonoscopy, flexible; with biopsy, single                        or multiple Diagnosis Code(s):    --- Professional ---                       Z12.11, Encounter for screening for malignant neoplasm                        of colon                       D12.0, Benign neoplasm of cecum                       D12.3, Benign neoplasm of  transverse colon (hepatic                        flexure or splenic flexure) CPT copyright 2018 American Medical Association. All rights reserved. The codes documented in this report are preliminary and upon coder review may  be revised to meet current compliance requirements.  Vonda Antigua, MD Margretta Sidle B. Bonna Gains MD, MD 05/11/2018 2:55:08 PM This report has been signed electronically. Number of Addenda: 0 Note Initiated On: 05/11/2018 1:39 PM Scope Withdrawal Time: 0 hours 29 minutes 38 seconds  Total Procedure Duration: 0 hours 41 minutes 13 seconds  Estimated Blood Loss: Estimated blood loss: none.      [facility]

## 2018-05-11 NOTE — Op Note (Addendum)
[facility] Patient Name: Lori Le Procedure Date: 05/11/2018 1:40 PM MRN: [id number] Account #: [id number] Date of Birth: May 02, 1965 Admit Type: Outpatient Age: 54 Room: [facility] ENDO ROOM 4 Gender: Female Note Status: Finalized Procedure:            Upper GI endoscopy Indications:          Iron deficiency anemia Providers:            Taviana Westergren B. Bonna Gains MD, MD Referring MD:         Einar Pheasant, MD (Referring MD) Medicines:            Monitored Anesthesia Care Complications:        No immediate complications. Procedure:            Pre-Anesthesia Assessment:                       - The risks and benefits of the procedure and the                        sedation options and risks were discussed with the                        patient. All questions were answered and informed                        consent was obtained.                       - Patient identification and proposed procedure were                        verified prior to the procedure.                       - ASA Grade Assessment: II - A patient with mild                        systemic disease.                       After obtaining informed consent, the endoscope was                        passed under direct vision. Throughout the procedure,                        the patient's blood pressure, pulse, and oxygen                        saturations were monitored continuously. The Endoscope                        was introduced through the mouth, and advanced to the                        jejunum. The upper GI endoscopy was accomplished with                        ease. The patient tolerated the procedure well. Findings:      One tongue of salmon-colored mucosa was present. No other visible  abnormalities were present. The maximum longitudinal extent of these       esophageal mucosal changes was 1 cm in length. Mucosa was biopsied with       a cold forceps for histology in a  targeted manner and in 4 quadrants.      Evidence of a gastric bypass was found. A gastric pouch was found. The       gastrojejunal anastomosis was characterized by ulceration. This was       traversed. Biopsies were taken with a cold forceps for Helicobacter       pylori testing. 3 clean base ulcers were seen near the anatomosis site.       Biopsies were obtained in the stomach and not at the ulcer sites for H       Pylori.      The examined jejunum was normal. Impression:           - Salmon-colored mucosa suspicious for short-segment                        Barrett's esophagus. Biopsied.                       - Gastric bypass. Gastrojejunal anastomosis                        characterized by ulceration. Biopsied.                       - Normal examined jejunum. Recommendation:       - Await pathology results.                       - Take prescribed proton pump inhibitor or H2 blocker                        (antacid) medications 30 - 60 minutes before meals.                       - Avoid NSAIDs except Aspirin if medically indicated by                        PCP                       - Return to my office as previously scheduled.                       - Continue present medications.                       - The findings and recommendations were discussed with                        the patient.                       - The findings and recommendations were discussed with                        the patient's family.                       - Use sucralfate suspension 1 gram PO QID. Procedure Code(s):    ---  Professional ---                       [phone], Esophagogastroduodenoscopy, flexible, transoral;                        with biopsy, single or multiple Diagnosis Code(s):    --- Professional ---                       K22.8, Other specified diseases of esophagus                       K28.9, Gastrojejunal ulcer, unspecified as acute or                        chronic, without hemorrhage or  perforation                       D50.9, Iron deficiency anemia, unspecified CPT copyright 2018 American Medical Association. All rights reserved. The codes documented in this report are preliminary and upon coder review may  be revised to meet current compliance requirements.  Vonda Antigua, MD Margretta Sidle B. Bonna Gains MD, MD 05/11/2018 2:01:05 PM This report has been signed electronically. Number of Addenda: 0 Note Initiated On: 05/11/2018 1:40 PM Estimated Blood Loss: Estimated blood loss: none.      [facility]

## 2018-05-11 NOTE — Anesthesia Post-op Follow-up Note (Signed)
Anesthesia QCDR form completed.        

## 2018-05-11 NOTE — Transfer of Care (Signed)
   Immediate Anesthesia Transfer of Care Note  Patient: Lori Le  Procedure(s) Performed: COLONOSCOPY WITH PROPOFOL (N/A ) ESOPHAGOGASTRODUODENOSCOPY (EGD) WITH PROPOFOL (N/A )  Patient Location: PACU  Anesthesia Type:General  Level of Consciousness: awake and sedated  Airway & Oxygen Therapy: Patient Spontanous Breathing and Patient connected to nasal cannula oxygen  Post-op Assessment: Report given to RN and Post -op Vital signs reviewed and stable  Post vital signs: Reviewed and stable  Last Vitals:  Vitals Value Taken Time  BP    Temp    Pulse    Resp    SpO2      Last Pain:  Vitals:   05/11/18 1309  TempSrc: Tympanic  PainSc: 0-No pain      Patients Stated Pain Goal: 0 (05/01/41 8887)  Complications: No apparent anesthesia complications

## 2018-05-11 NOTE — H&P (Signed)
Vonda Antigua, MD [address] [place], [place], [place], [address] [address], [place], [place], [place], [address] Phone: [phone]  Fax: [phone]  Primary Care Physician:  Einar Pheasant, MD   Pre-Procedure History & Physical: HPI:  Lori Le is a 53 y.o. female is here for a colonoscopy and EGD.   Past Medical History:  Diagnosis Date  . Allergic rhinitis due to allergen   . Allergy   . Anemia   . Arthritis   . Cancer ([place])    skin ca  . Chronic idiopathic urticaria   . Complication of anesthesia    nausea, slow to wake up  . Depression   . GERD (gastroesophageal reflux disease)   . Gout   . Gout   . Hashimoto's thyroiditis   . Heart murmur   . History of eating disorder   . Hives   . Hyperlipidemia   . Hypertension   . Hypothyroidism   . Lower extremity edema   . Melanoma in situ ([place])    left shoulder  . Migraine   . Mitral valve disorder   . Motion sickness    all moving vehicles  . PCOS (polycystic ovarian syndrome)   . PONV (postoperative nausea and vomiting)   . Thyroid disease   . Ulcer   . Vertigo    last episode over 1 yr ago    Past Surgical History:  Procedure Laterality Date  . BREAST BIOPSY Right    neg- bx/clip  . CHOLECYSTECTOMY  2003  . DILITATION & CURRETTAGE/HYSTROSCOPY WITH NOVASURE ABLATION N/A 08/19/2016   Procedure: DILATATION & CURETTAGE/HYSTEROSCOPY WITH NOVASURE ABLATION;  Surgeon: [name], MD;  Location: [facility] ORS;  Service: Gynecology;  Laterality: N/A;  . ESOPHAGOGASTRODUODENOSCOPY (EGD) WITH PROPOFOL N/A 12/05/2015   Procedure: ESOPHAGOGASTRODUODENOSCOPY (EGD) WITH PROPOFOL;  Surgeon: Lollie Sails, MD;  Location: [facility] ENDOSCOPY;  Service: Endoscopy;  Laterality: N/A;  . ESOPHAGOGASTRODUODENOSCOPY (EGD) WITH PROPOFOL N/A 02/06/2016   Procedure: ESOPHAGOGASTRODUODENOSCOPY (EGD) WITH PROPOFOL;  Surgeon: Lollie Sails, MD;  Location: [facility] ENDOSCOPY;  Service: Endoscopy;  Laterality: N/A;  . FOOT  SURGERY  1994  . GASTRIC BYPASS  2011  . JOINT REPLACEMENT Left 09/27/2014   HIP  . JOINT REPLACEMENT Left 12/01/2014   KNEE  . KNEE ARTHROSCOPY Right   . KNEE ARTHROSCOPY Left 03/23/2015   Procedure: ARTHROSCOPY KNEE, partial synovectomy;  Surgeon: Hessie Knows, MD;  Location: [facility] ORS;  Service: Orthopedics;  Laterality: Left;  . LAPAROSCOPIC GASTRIC BANDING  2010  . ROTATOR CUFF REPAIR Bilateral    right shoulder 07-26-2016  . SHOULDER ARTHROSCOPY Left 06/20/2015   Procedure: ARTHROSCOPY SHOULDER, REPAIR OF MASSIVE ROTATOR CUFF TEAR, TENODESIS, DECOMPRESSION DEBRIDEMENT;  Surgeon: Corky Mull, MD;  Location: [facility] ORS;  Service: Orthopedics;  Laterality: Left;  . TOTAL HIP ARTHROPLASTY Left 09/27/2014   Procedure: TOTAL HIP ARTHROPLASTY ANTERIOR APPROACH;  Surgeon: Hessie Knows, MD;  Location: [facility] ORS;  Service: Orthopedics;  Laterality: Left;  . TOTAL KNEE ARTHROPLASTY Left 12/01/2014   Procedure: TOTAL KNEE ARTHROPLASTY;  Surgeon: Hessie Knows, MD;  Location: [facility] ORS;  Service: Orthopedics;  Laterality: Left;    Prior to Admission medications   Medication Sig Start Date End Date Taking? Authorizing Provider  acetaminophen (TYLENOL) 650 MG CR tablet Take 1,300 mg by mouth 2 (two) times daily.   Yes [provider]  allopurinol (ZYLOPRIM) 300 MG tablet TAKE 1 TABLET BY MOUTH EVERY DAY 04/22/18  Yes Einar Pheasant, MD  azelastine (ASTELIN) 0.1 %  nasal spray U 1 TO 2 SPRAYS IEN BID 07/08/17  Yes [provider]  colchicine 0.6 MG tablet Take 1 tablet (0.6 mg total) by mouth 2 (two) times daily. 09/12/17  Yes Einar Pheasant, MD  cyanocobalamin (,VITAMIN B-12,) 1000 MCG/ML injection INJECT 1 ML INTO THE MUSCLE EVERY 30 DAYS 05/10/18  Yes Karen Kitchens, NP  DYMISTA 137-50 MCG/ACT SUSP SPRAY 1 SPRAY INTO EACH NOSTRIL BID 05/14/17  Yes [provider]  furosemide (LASIX) 40 MG tablet TAKE 1 TABLET BY MOUTH DAILY 03/16/18  Yes Einar Pheasant, MD  levocetirizine (XYZAL) 5  MG tablet every evening. 04/23/17  Yes [provider]  levothyroxine (SYNTHROID, LEVOTHROID) 137 MCG tablet TAKE ONE TABLET BY MOUTH ONCE DAILY BEFORE BREAKFAST Patient taking differently: Take 137 mcg by mouth daily before breakfast. DAILY BEFORE BREAKFAST 09/11/15  Yes Einar Pheasant, MD  metolazone (ZAROXOLYN) 5 MG tablet TK 1 T PO D 60 MINUTES PRIOR TO TAKING MAXIDE AND FUROSEMIDE 01/24/18  Yes [provider]  montelukast (SINGULAIR) 10 MG tablet Take 10 mg by mouth daily. 04/23/17  Yes [provider]  mupirocin ointment (BACTROBAN) 2 % Place 1 application into the nose 2 (two) times daily. 03/21/17  Yes Einar Pheasant, MD  pantoprazole (PROTONIX) 40 MG tablet TAKE 1 TABLET BY MOUTH EVERY DAY 03/19/18  Yes Einar Pheasant, MD  sertraline (ZOLOFT) 50 MG tablet Take 1 tablet (50 mg total) by mouth daily. 03/16/18 03/16/19 Yes Arfeen, Arlyce Harman, MD  triamterene-hydrochlorothiazide (MAXZIDE-25) 37.5-25 MG tablet TAKE 1 TABLET BY MOUTH EVERY DAY 01/14/18  Yes Einar Pheasant, MD  Vitamin D, Ergocalciferol, (DRISDOL) 1.25 MG (50000 UT) CAPS capsule Take 50,000 Units by mouth every 7 (seven) days.   Yes [provider]  potassium chloride (K-DUR,KLOR-CON) 10 MEQ tablet TAKE 1 TABLET BY MOUTH TWICE DAILY 03/23/18   Einar Pheasant, MD  SYRINGE-NEEDLE, DISP, 3 ML (B-D INTEGRA SYRINGE) 23G X 1" 3 ML MISC USE TO ADMINISTER PARENTERAL B12 SUPPLEMENTATION 05/09/18   Karen Kitchens, NP  triamterene-hydrochlorothiazide (MAXZIDE-25) 37.5-25 MG tablet TAKE 1 TABLET BY MOUTH EVERY DAY 01/14/18   Einar Pheasant, MD    Allergies as of 04/21/2018 - Review Complete 03/16/2018  Allergen Reaction Noted  . Contrast media [iodinated diagnostic agents] Anaphylaxis 05/24/2017  . Etodolac Anaphylaxis 02/05/2017  . Influenza vaccines Hives 12/09/2013  . Lactose Nausea And Vomiting 04/25/2015  . Lactose intolerance (gi) Nausea And Vomiting 09/27/2014  . Bacitracin-neomycin-polymyxin Rash  04/25/2015  . Neomycin-bacitracin zn-polymyx Rash   . Neosporin + pain relief max st [neomy-bacit-polymyx-pramoxine] Rash 07/31/2012    Family History  Problem Relation Age of Onset  . Arthritis Mother   . Hyperlipidemia Mother   . Hypertension Mother   . Diabetes Mother   . Arthritis Father   . Hyperlipidemia Father   . Hypertension Father   . Mental illness Father   . Diabetes Father   . Arthritis Maternal Grandmother   . Cancer Maternal Grandmother        breast cancer  . Hyperlipidemia Maternal Grandmother   . Hypertension Maternal Grandmother   . Breast cancer Maternal Grandmother   . Arthritis Maternal Grandfather   . Hyperlipidemia Maternal Grandfather   . Hypertension Maternal Grandfather   . Heart disease Maternal Grandfather        heart attack  . Breast cancer Paternal Grandmother     Social History   Socioeconomic History  . Marital status: Divorced    Spouse name: Not on file  . Number  of children: Not on file  . Years of education: Not on file  . Highest education level: Not on file  Occupational History  . Not on file  Social Needs  . Financial resource strain: Not on file  . Food insecurity:    Worry: Not on file    Inability: Not on file  . Transportation needs:    Medical: Not on file    Non-medical: Not on file  Tobacco Use  . Smoking status: Never Smoker  . Smokeless tobacco: Never Used  Substance and Sexual Activity  . Alcohol use: Yes    Alcohol/week: 0.0 - 1.0 standard drinks    Comment: occ.  . Drug use: No  . Sexual activity: Yes    Partners: Male    Birth control/protection: Surgical    Comment: Ablation   Lifestyle  . Physical activity:    Days per week: Not on file    Minutes per session: Not on file  . Stress: Not on file  Relationships  . Social connections:    Talks on phone: Not on file    Gets together: Not on file    Attends religious service: Not on file    Active member of club or organization: Not on file     Attends meetings of clubs or organizations: Not on file    Relationship status: Not on file  . Intimate partner violence:    Fear of current or ex partner: Not on file    Emotionally abused: Not on file    Physically abused: Not on file    Forced sexual activity: Not on file  Other Topics Concern  . Not on file  Social History Narrative  . Not on file    Review of Systems: See HPI, otherwise negative ROS  Physical Exam: BP 128/87   Pulse 68   Temp (!) 97.2 F (36.2 C) (Tympanic)   Resp 20   Ht 5' 7.5" (1.715 m)   Wt 102.5 kg   LMP  (LMP Unknown)   SpO2 99%   BMI 34.87 kg/m  General:   Alert,  pleasant and cooperative in NAD Head:  Normocephalic and atraumatic. Neck:  Supple; no masses or thyromegaly. Lungs:  Clear throughout to auscultation, normal respiratory effort.    Heart:  +S1, +S2, Regular rate and rhythm, No edema. Abdomen:  Soft, nontender and nondistended. Normal bowel sounds, without guarding, and without rebound.   Neurologic:  Alert and  oriented x4;  grossly normal neurologically.  Impression/Plan: Lori Le is here for a colonoscopy to be performed for average risk screening and EGD for iron deficiency anemia.   Risks, benefits, limitations, and alternatives regarding the procedures have been reviewed with the patient.  Questions have been answered.  All parties agreeable.   [name], MD  05/11/2018, 1:34 PM

## 2018-05-11 NOTE — Anesthesia Preprocedure Evaluation (Signed)
Anesthesia Evaluation  Patient identified by MRN, date of birth, ID band Patient awake    Reviewed: Allergy & Precautions, NPO status , Patient's Chart, lab work & pertinent test results  History of Anesthesia Complications (+) PONV and history of anesthetic complications  Airway Mallampati: III  TM Distance: >3 FB Neck ROM: Full    Dental no notable dental hx.    Pulmonary neg pulmonary ROS, neg sleep apnea, neg COPD,    breath sounds clear to auscultation- rhonchi (-) wheezing      Cardiovascular hypertension, Pt. on medications (-) CAD, (-) Past MI, (-) Cardiac Stents and (-) CABG  Rhythm:Regular Rate:Normal - Systolic murmurs and - Diastolic murmurs    Neuro/Psych  Headaches, PSYCHIATRIC DISORDERS Depression    GI/Hepatic Neg liver ROS, GERD  ,  Endo/Other  neg diabetesHypothyroidism   Renal/GU negative Renal ROS     Musculoskeletal  (+) Arthritis ,   Abdominal (+) + obese,   Peds  Hematology  (+) anemia ,   Anesthesia Other Findings Past Medical History: No date: Allergic rhinitis due to allergen No date: Allergy No date: Anemia No date: Arthritis No date: Cancer ([place])     Comment:  skin ca No date: Chronic idiopathic urticaria No date: Complication of anesthesia     Comment:  nausea, slow to wake up No date: Depression No date: GERD (gastroesophageal reflux disease) No date: Gout No date: Gout No date: Hashimoto's thyroiditis No date: Heart murmur No date: History of eating disorder No date: Hives No date: Hyperlipidemia No date: Hypertension No date: Hypothyroidism No date: Lower extremity edema No date: Melanoma in situ ([place])     Comment:  left shoulder No date: Migraine No date: Mitral valve disorder No date: Motion sickness     Comment:  all moving vehicles No date: PCOS (polycystic ovarian syndrome) No date: PONV (postoperative nausea and vomiting) No date: Thyroid disease No date:  Ulcer No date: Vertigo     Comment:  last episode over 1 yr ago   Reproductive/Obstetrics                             Anesthesia Physical Anesthesia Plan  ASA: III  Anesthesia Plan: General   Post-op Pain Management:    Induction: Intravenous  PONV Risk Score and Plan: 3 and Propofol infusion  Airway Management Planned: Natural Airway  Additional Equipment:   Intra-op Plan:   Post-operative Plan:   Informed Consent: I have reviewed the patients History and Physical, chart, labs and discussed the procedure including the risks, benefits and alternatives for the proposed anesthesia with the patient or authorized representative who has indicated his/her understanding and acceptance.     Dental advisory given  Plan Discussed with: CRNA and Anesthesiologist  Anesthesia Plan Comments:         Anesthesia Quick Evaluation

## 2018-05-12 NOTE — Anesthesia Postprocedure Evaluation (Signed)
Anesthesia Post Note  Patient: Lori Le  Procedure(s) Performed: COLONOSCOPY WITH PROPOFOL (N/A ) ESOPHAGOGASTRODUODENOSCOPY (EGD) WITH PROPOFOL (N/A )  Patient location during evaluation: Endoscopy Anesthesia Type: General Level of consciousness: awake and alert and oriented Pain management: pain level controlled Vital Signs Assessment: post-procedure vital signs reviewed and stable Respiratory status: spontaneous breathing, nonlabored ventilation and respiratory function stable Cardiovascular status: blood pressure returned to baseline and stable Postop Assessment: no signs of nausea or vomiting Anesthetic complications: no     Last Vitals:  Vitals:   05/11/18 1508 05/11/18 1518  BP: (!) 144/92 134/82  Pulse: 73 77  Resp: 15 20  Temp:    SpO2: 100% 100%    Last Pain:  Vitals:   05/11/18 1518  TempSrc:   PainSc: 0-No pain                 Moxie Kalil

## 2018-05-13 ENCOUNTER — Encounter: Payer: Self-pay | Admitting: Internal Medicine

## 2018-05-13 ENCOUNTER — Encounter: Payer: Self-pay | Admitting: Gastroenterology

## 2018-05-13 DIAGNOSIS — Z860100 Personal history of colon polyps, unspecified: Secondary | ICD-10-CM | POA: Insufficient documentation

## 2018-05-13 LAB — POCT PREGNANCY, URINE: Preg Test, Ur: NEGATIVE

## 2018-05-13 LAB — SURGICAL PATHOLOGY

## 2018-05-15 ENCOUNTER — Other Ambulatory Visit: Payer: Managed Care, Other (non HMO)

## 2018-05-18 ENCOUNTER — Ambulatory Visit: Payer: Managed Care, Other (non HMO)

## 2018-05-18 ENCOUNTER — Ambulatory Visit: Payer: Managed Care, Other (non HMO) | Admitting: Hematology and Oncology

## 2018-05-19 ENCOUNTER — Ambulatory Visit (INDEPENDENT_AMBULATORY_CARE_PROVIDER_SITE_OTHER): Payer: 59 | Admitting: Psychiatry

## 2018-05-19 DIAGNOSIS — F330 Major depressive disorder, recurrent, mild: Secondary | ICD-10-CM

## 2018-05-19 DIAGNOSIS — F411 Generalized anxiety disorder: Secondary | ICD-10-CM

## 2018-05-19 MED ORDER — SERTRALINE HCL 50 MG PO TABS: 50.0000 mg | ORAL_TABLET | Freq: Every day | ORAL | 0 refills | Status: DC
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
## 2018-05-22 ENCOUNTER — Inpatient Hospital Stay: Payer: Managed Care, Other (non HMO) | Attending: Hematology and Oncology

## 2018-05-22 DIAGNOSIS — K9589 Other complications of other bariatric procedure: Secondary | ICD-10-CM

## 2018-05-22 DIAGNOSIS — D500 Iron deficiency anemia secondary to blood loss (chronic): Secondary | ICD-10-CM | POA: Insufficient documentation

## 2018-05-22 DIAGNOSIS — D509 Iron deficiency anemia, unspecified: Secondary | ICD-10-CM

## 2018-05-22 LAB — IRON AND TIBC
Iron: 31 ug/dL (ref 28–170)
Saturation Ratios: 8 % — ABNORMAL LOW (ref 10.4–31.8)
TIBC: 373 ug/dL (ref 250–450)
UIBC: 342 ug/dL

## 2018-05-22 LAB — CBC WITH DIFFERENTIAL/PLATELET
Abs Immature Granulocytes: 0.04 10*3/uL (ref 0.00–0.07)
Basophils Absolute: 0 10*3/uL (ref 0.0–0.1)
Basophils Relative: 0 %
Eosinophils Absolute: 0 10*3/uL (ref 0.0–0.5)
Eosinophils Relative: 0 %
HCT: 42.2 % (ref 36.0–46.0)
Hemoglobin: 14.5 g/dL (ref 12.0–15.0)
Immature Granulocytes: 1 %
Lymphocytes Relative: 11 %
Lymphs Abs: 0.9 10*3/uL (ref 0.7–4.0)
MCH: 30.5 pg (ref 26.0–34.0)
MCHC: 34.4 g/dL (ref 30.0–36.0)
MCV: 88.7 fL (ref 80.0–100.0)
Monocytes Absolute: 0.2 10*3/uL (ref 0.1–1.0)
Monocytes Relative: 2 %
Neutro Abs: 6.8 10*3/uL (ref 1.7–7.7)
Neutrophils Relative %: 86 %
Platelets: 364 10*3/uL (ref 150–400)
RBC: 4.76 MIL/uL (ref 3.87–5.11)
RDW: 13.4 % (ref 11.5–15.5)
WBC: 8 10*3/uL (ref 4.0–10.5)
nRBC: 0 % (ref 0.0–0.2)

## 2018-05-22 LAB — FERRITIN: Ferritin: 33 ng/mL (ref 11–307)

## 2018-05-23 NOTE — Progress Notes (Signed)
[facility]     [address], [address]     [place], [place] [address]     Phone: [phone]      Fax: [phone]       Clinic day:  05/25/2018  Chief Complaint: Lori Le is a 53 y.o. female s/p gastric bypass surgery and subsequent iron deficiency and B12 deficiency who is seen for 6 month assessment.  HPI:  The patient was last seen in the hematology clinic on 10/28/2017.  At that time, she felt "ok".  She had issues with fluid retention and gout. She had a history of blood in her stool.  She had cancelled 2 colonoscopies.  Exam revealed bilateral lower extremity edema (below knees).  Hematocrit was 38.6 and hemoglobin 13.1.  Ferritin was 48.  She underwent colonoscopy with Dr. Bonna Gains on 05/11/2008.  Findings revealed one 10 mm polyp in the cecum (tubular adenoma), one 3 mm polyp in the transverse colon, one 7 mm polyp in the transverse colon, and one 6 mm polyp in the transverse colon (all fragments of tubular adenomas).  Repeat colonoscopy in 3 years planned.  EGD on 05/11/2018 revealed salmon-colored mucosa suspicious for short-segment Barrett's esophagus. Pathology revealed squamocolumnar mucosa with features of mild reflux gastroesophagitis.  There was no dysplasia or malignancy.  Gastric bypass with gastrojejunal anastomosis was characterized by ulceration. Examined jejunum was normal.  She receives B12 at home (last injection in the past 1-2 weeks).  Labs have been followed: 01/27/2018:  Hematocrit 44.8, hemoglobin 14.8, and MCV 89.4.  Ferritin 53. 05/22/2018:  Hematocrit 42.2, hemoglobin 14.5, and MCV 88.7.  Ferritin 33 .  During the interim, she has felt tired.  She notes fatigue has been gradually coming on.  She denies any blood in her stool.  She denies any pica.   Past Medical History:  Diagnosis Date  . Allergic rhinitis due to allergen   . Allergy   . Anemia   . Anxiety   . Arthritis   . B12 deficiency   . Cancer ([place])    skin  ca  . Chronic idiopathic urticaria   . Complication of anesthesia    nausea, slow to wake up  . Depression   . Edema   . Family history of adverse reaction to anesthesia    Father - PONV  . Gastric ulcer   . GERD (gastroesophageal reflux disease)   . Gout   . Gout   . Hashimoto's thyroiditis   . Heart murmur   . History of eating disorder   . Hives   . Hyperlipidemia   . Hypertension   . Hypothyroidism   . Lower extremity edema   . Melanoma in situ ([place])    left shoulder  . Migraine    approx 4x/yr  . Mitral valve disorder   . Motion sickness    all moving vehicles  . Neuropathy    fingers and feet  . PCOS (polycystic ovarian syndrome)   . PONV (postoperative nausea and vomiting)   . Thyroid disease   . Ulcer   . Vertigo    last episode over 1 yr ago    Past Surgical History:  Procedure Laterality Date  . BONE EXCISION Left 09/03/2018   Procedure: PART EXCISION BONE-PHALANX 2,3,4 LEFT;  Surgeon: Albertine Patricia, DPM;  Location: [place];  Service: Podiatry;  Laterality: Left;  . BREAST BIOPSY Right 2007   neg- bx/clip  . CHOLECYSTECTOMY  2003  . COLONOSCOPY  WITH PROPOFOL N/A 05/11/2018   Procedure: COLONOSCOPY WITH PROPOFOL;  Surgeon: [name], MD;  Location: [facility] ENDOSCOPY;  Service: Endoscopy;  Laterality: N/A;  . DILITATION & CURRETTAGE/HYSTROSCOPY WITH NOVASURE ABLATION N/A 08/19/2016   Procedure: DILATATION & CURETTAGE/HYSTEROSCOPY WITH NOVASURE ABLATION;  Surgeon: [name], MD;  Location: [facility];  Service: Gynecology;  Laterality: N/A;  . ESOPHAGOGASTRODUODENOSCOPY (EGD) WITH PROPOFOL N/A 12/05/2015   Procedure: ESOPHAGOGASTRODUODENOSCOPY (EGD) WITH PROPOFOL;  Surgeon: Lollie Sails, MD;  Location: [facility] ENDOSCOPY;  Service: Endoscopy;  Laterality: N/A;  . ESOPHAGOGASTRODUODENOSCOPY (EGD) WITH PROPOFOL N/A 02/06/2016   Procedure: ESOPHAGOGASTRODUODENOSCOPY (EGD) WITH PROPOFOL;  Surgeon: Lollie Sails, MD;  Location: [facility]  ENDOSCOPY;  Service: Endoscopy;  Laterality: N/A;  . ESOPHAGOGASTRODUODENOSCOPY (EGD) WITH PROPOFOL N/A 05/11/2018   Procedure: ESOPHAGOGASTRODUODENOSCOPY (EGD) WITH PROPOFOL;  Surgeon: [name], MD;  Location: [facility] ENDOSCOPY;  Service: Endoscopy;  Laterality: N/A;  . ESOPHAGOGASTRODUODENOSCOPY (EGD) WITH PROPOFOL N/A 09/08/2018   Procedure: ESOPHAGOGASTRODUODENOSCOPY (EGD) WITH BIOPSY;  Surgeon: [name], MD;  Location: [place];  Service: Endoscopy;  Laterality: N/A;  . FOOT ARTHRODESIS Left 09/03/2018   Procedure: ARTHRODESIS; HALLUX/IP JOINT LEFT;  Surgeon: Albertine Patricia, DPM;  Location: [place];  Service: Podiatry;  Laterality: Left;  LMA LOCAL  . FOOT SURGERY  1994  . GASTRIC BYPASS  2011  . JOINT REPLACEMENT Left 09/27/2014   HIP  . JOINT REPLACEMENT Left 12/01/2014   KNEE  . KNEE ARTHROSCOPY Right   . KNEE ARTHROSCOPY Left 03/23/2015   Procedure: ARTHROSCOPY KNEE, partial synovectomy;  Surgeon: Hessie Knows, MD;  Location: [facility];  Service: Orthopedics;  Laterality: Left;  . LAPAROSCOPIC GASTRIC BANDING  2010  . POLYPECTOMY N/A 09/08/2018   Procedure: POLYPECTOMY;  Surgeon: [name], MD;  Location: [place];  Service: Endoscopy;  Laterality: N/A;  Stomach  . ROTATOR CUFF REPAIR Bilateral    right shoulder 07-26-2016  . SHOULDER ARTHROSCOPY Left 06/20/2015   Procedure: ARTHROSCOPY SHOULDER, REPAIR OF MASSIVE ROTATOR CUFF TEAR, TENODESIS, DECOMPRESSION DEBRIDEMENT;  Surgeon: Corky Mull, MD;  Location: [facility];  Service: Orthopedics;  Laterality: Left;  . TOTAL HIP ARTHROPLASTY Left 09/27/2014   Procedure: TOTAL HIP ARTHROPLASTY ANTERIOR APPROACH;  Surgeon: Hessie Knows, MD;  Location: [facility];  Service: Orthopedics;  Laterality: Left;  . TOTAL KNEE ARTHROPLASTY Left 12/01/2014   Procedure: TOTAL KNEE ARTHROPLASTY;  Surgeon: Hessie Knows, MD;  Location: [facility];  Service: Orthopedics;  Laterality: Left;    Family  History  Problem Relation Age of Onset  . Arthritis Mother   . Hyperlipidemia Mother   . Hypertension Mother   . Diabetes Mother   . Sleep apnea Mother   . Obesity Mother   . Arthritis Father   . Hyperlipidemia Father   . Hypertension Father   . Mental illness Father   . Diabetes Father   . Obesity Father   . Sleep apnea Father   . Bipolar disorder Father   . Anxiety disorder Father   . Depression Father   . Cancer Father   . Kidney disease Father   . Arthritis Maternal Grandmother   . Cancer Maternal Grandmother        breast cancer  . Hyperlipidemia Maternal Grandmother   . Hypertension Maternal Grandmother   . Breast cancer Maternal Grandmother   . Arthritis Maternal Grandfather   . Hyperlipidemia Maternal Grandfather   . Hypertension Maternal Grandfather   . Heart disease Maternal Grandfather  heart attack  . Breast cancer Paternal Grandmother     Social History:  reports that she has never smoked. She has never used smokeless tobacco. She reports current alcohol use. She reports that she does not use drugs.  The patient lives in [place].  The patient is alone today.  Allergies:  Allergies  Allergen Reactions  . Contrast Media [Iodinated Diagnostic Agents] Anaphylaxis    MRI dye  . Etodolac Anaphylaxis  . Alpha-Gal     Flu- like symptoms  . Influenza Vaccines Hives  . Lactose Nausea And Vomiting    Can have yogurt Can not have milk and ice cream   . Lactose Intolerance (Gi) Nausea And Vomiting    Can have yogurt Can not have milk and ice cream    . Bacitracin-Neomycin-Polymyxin Rash  . Neomycin-Bacitracin Zn-Polymyx Rash  . Neosporin + Pain Relief Max St [Neomy-Bacit-Polymyx-Pramoxine] Rash    Current Medications: Current Outpatient Medications  Medication Sig Dispense Refill  . allopurinol (ZYLOPRIM) 300 MG tablet TAKE 1 TABLET BY MOUTH EVERY DAY 90 tablet 2  . gabapentin (NEURONTIN) 100 MG capsule Take 200 mg by mouth 2 (two) times daily.    .  cephALEXin (KEFLEX) 500 MG capsule Take 1 capsule (500 mg total) by mouth 3 (three) times daily. (Patient not taking: Reported on 09/08/2018) 21 capsule 0  . cyanocobalamin (,VITAMIN B-12,) 1000 MCG/ML injection ADM 1 ML IM Q 30 DAYS    . Dupilumab (DUPIXENT) 300 MG/2ML SOSY     . furosemide (LASIX) 40 MG tablet Take 1 tablet (40 mg total) by mouth daily. 90 tablet 0  . HYDROcodone-acetaminophen (NORCO) 7.5-325 MG tablet Take 1 tablet by mouth every 6 (six) hours as needed for moderate pain. 30 tablet 0  . levothyroxine (SYNTHROID) 137 MCG tablet TK 1 T PO QD 30 TO 60 MIN B BRE OES AND WITH A GLASS OF WATER    . linaclotide (LINZESS) 145 MCG CAPS capsule Take 1 capsule (145 mcg total) by mouth daily before breakfast for 30 days. 30 capsule 1  . metFORMIN (GLUCOPHAGE) 500 MG tablet Take 1 tablet (500 mg total) by mouth daily with breakfast. 30 tablet 0  . metolazone (ZAROXOLYN) 5 MG tablet Take 5 mg by mouth daily as needed.     . montelukast (SINGULAIR) 10 MG tablet Take 10 mg by mouth at bedtime.    . pantoprazole (PROTONIX) 40 MG tablet Take 1 tablet (40 mg total) by mouth daily for 30 days. 30 tablet 0  . polyethylene glycol powder (GLYCOLAX/MIRALAX) 17 GM/SCOOP powder Take 17 g by mouth daily as needed for moderate constipation. 255 g 0  . potassium chloride SA (K-DUR,KLOR-CON) 20 MEQ tablet Take two tablets (40 meq) by mouth daily in the morning and 1 tablet (20 meq) by mouth daily in the evening 90 tablet 0  . sertraline (ZOLOFT) 50 MG tablet Take 1.5 tablets (75 mg total) by mouth daily. 135 tablet 0  . triamcinolone (KENALOG) 0.025 % cream APP EXT AA BID    . triamterene-hydrochlorothiazide (MAXZIDE-25) 37.5-25 MG tablet TAKE 1 TABLET BY MOUTH EVERY DAY 90 tablet 0   No current facility-administered medications for this visit.    Facility-Administered Medications Ordered in Other Visits  Medication Dose Route Frequency Provider Last Rate Last Dose  . 0.9 %  sodium chloride infusion    Intravenous Continuous Lequita Asal, MD 0 mL/hr at 05/08/17 1437    . cyanocobalamin ((VITAMIN B-12)) injection 1,000 mcg  1,000 mcg Intramuscular Once  Lequita Asal, MD        Review of Systems:  GENERAL:  Fatigue, gradually coming on.  No fevers, sweats.  Weight loss of 18 pounds since last visit. PERFORMANCE STATUS (ECOG):  1 HEENT:  No visual changes, runny nose, sore throat, mouth sores or tenderness. Lungs: No shortness of breath or cough.  No hemoptysis. Cardiac:  No chest pain, palpitations, orthopnea, or PND. GI:  No nausea, vomiting, diarrhea, constipation, melena or hematochezia. GU:  No urgency, frequency, dysuria, or hematuria. Musculoskeletal:  No back pain.  No joint pain.  No muscle tenderness. Extremities:  No pain or swelling. Skin:  No rashes or skin changes. Neuro:  No headache, numbness or weakness, balance or coordination issues. Endocrine:  No diabetes.  Thyroid disease on Synthroid.  No hot flashes or night sweats. Psych:  No mood changes, depression or anxiety. Pain:  No focal pain. Review of systems:  All other systems reviewed and found to be negative.    Physical Exam: Blood pressure 109/73, pulse 84, temperature 98.8 F (37.1 C), temperature source Tympanic, resp. rate 18, height 5' 7.5" (1.715 m), weight 225 lb 8.5 oz (102.3 kg), SpO2 97 %. GENERAL:  Well developed, well nourished, woman sitting comfortably in the exam room in no acute distress. MENTAL STATUS:  Alert and oriented to person, place and time. HEAD:  Short brown hair.  Normocephalic, atraumatic, face symmetric, no Cushingoid features. EYES:  Brown eyes.  Pupils equal round and reactive to light and accomodation.  No conjunctivitis or scleral icterus. ENT:  Oropharynx clear without lesion.  Tongue normal. Mucous membranes moist.  RESPIRATORY:  Clear to auscultation without rales, wheezes or rhonchi. CARDIOVASCULAR:  Regular rate and rhythm without murmur, rub or gallop. ABDOMEN:   Soft, non-tender, with active bowel sounds, and no hepatosplenomegaly.  No masses. SKIN:  No rashes, ulcers or lesions. EXTREMITIES: No edema, no skin discoloration or tenderness.  No palpable cords. LYMPH NODES: No palpable cervical, supraclavicular, axillary or inguinal adenopathy  NEUROLOGICAL: Unremarkable. PSYCH:  Appropriate.    No visits with results within 3 Day(s) from this visit.  Latest known visit with results is:  Appointment on 05/22/2018  Component Date Value Ref Range Status  . Iron 05/22/2018 31  28 - 170 ug/dL Final  . TIBC 05/22/2018 373  250 - 450 ug/dL Final  . Saturation Ratios 05/22/2018 8* 10.4 - 31.8 % Final  . UIBC 05/22/2018 342  ug/dL Final   Performed at [facility], [address] [place], [place] [address]  . Ferritin 05/22/2018 33  11 - 307 ng/mL Final   Performed at [facility], [address] [place], [place] [address]  . WBC 05/22/2018 8.0  4.0 - 10.5 K/uL Final  . RBC 05/22/2018 4.76  3.87 - 5.11 MIL/uL Final  . Hemoglobin 05/22/2018 14.5  12.0 - 15.0 g/dL Final  . HCT 05/22/2018 42.2  36.0 - 46.0 % Final  . MCV 05/22/2018 88.7  80.0 - 100.0 fL Final  . MCH 05/22/2018 30.5  26.0 - 34.0 pg Final  . MCHC 05/22/2018 34.4  30.0 - 36.0 g/dL Final  . RDW 05/22/2018 13.4  11.5 - 15.5 % Final  . Platelets 05/22/2018 364  150 - 400 K/uL Final  . nRBC 05/22/2018 0.0  0.0 - 0.2 % Final  . Neutrophils Relative % 05/22/2018 86  % Final  . Neutro Abs 05/22/2018 6.8  1.7 - 7.7 K/uL Final  . Lymphocytes Relative 05/22/2018 11  %  Final  . Lymphs Abs 05/22/2018 0.9  0.7 - 4.0 K/uL Final  . Monocytes Relative 05/22/2018 2  % Final  . Monocytes Absolute 05/22/2018 0.2  0.1 - 1.0 K/uL Final  . Eosinophils Relative 05/22/2018 0  % Final  . Eosinophils Absolute 05/22/2018 0.0  0.0 - 0.5 K/uL Final  . Basophils Relative 05/22/2018 0  % Final  . Basophils Absolute 05/22/2018 0.0  0.0 - 0.1 K/uL Final  . Immature Granulocytes 05/22/2018 1  %  Final  . Abs Immature Granulocytes 05/22/2018 0.04  0.00 - 0.07 K/uL Final   Performed at [facility], [address], [place], [place] [address]    Assessment:  Lori Le is a 53 y.o. female s/p gastric bypass surgery (2011) with iron deficiency anemia. She has a history of bleeding ulcer in 10/2015.  She had heavy menses.  She underwent uterine ablation on 08/19/2016.  She bruises easily.  EGD on 12/05/2015 revealed edema and erythema of the gastrojejunal anastomosis. She was treated with Carafate and Protonix.  Repeat EGD on 02/06/2016 revealed minimal erythema on the tips of some gastric folds.    EGD on 05/11/2018 revealed salmon-colored mucosa suspicious for short-segment Barrett's esophagus. Pathology revealed squamocolumnar mucosa with features of mild reflux gastroesophagitis.  There was no dysplasia or malignancy. Gastric bypass with gastrojejunal anastomosis was characterized by ulceration. Pathology revealed oxyntic mucosa with mild oxyntic gland hyperplasia.  There was no malignancy, dysplasia, or H pylori.  Examined jejunum was normal.  Colonoscopy on 05/11/2018 revealed one 10 mm polyp in the cecum (tubular adenoma), one 3 mm polyp in the transverse colon, one 7 mm polyp in the transverse colon, and one 6 mm polyp in the transverse colon (all fragments of tubular adenomas).  Repeat colonoscopy in 3 years planned.  Diet is good.  She denies any ice pica.  She is intolerant of oral iron secondary to severe constipation.  She has B12 deficiency.  B12 was 168 on 12/29/2012, 367 on 11/29/2015, and 252 on 10/24/2016.  She restarted B12 (last 01/24/2017).  Folate was 30 on 10/24/2016.  Workup on 03/08/2016 revealed a hematocrit of 30.1, hemoglobin 9.1, MCV 70.7, platelets 331,000, WBC 8200 with an ANC of 5800.  Ferritin was 5.  PT and PTT were normal.  Platelet function assay was normal.  She received Venofer 200 mg IV weekly x 4 (03/15/2016 - 04/05/2016), x 2 (07/23/2016  and 08/05/2016), x 3 (01/29/2017 - 02/12/2017),  x 2 (05/01/2017 - 05/08/2017) and x 2 (08/04/2017 - 08/13/2017).  She receives Venofer if her ferritin is < 30.  Ferritin has been followed: 8 on 01/24/2017, 20 on 04/28/2017, 18 on 07/28/2017, 135 on 09/12/2017, 48 on 10/27/2017, 53 on 01/27/2018, and 33 on 05/22/2018.  She has a family history of breast cancer.  Screening bilateral mammogram on 05/09/2017 revealed no evidence of malignancy.  Symptomatically, she notes the gradual onset of fatigue.  Exam is unremarkable.  Plan: 1.   Review labs from 05/22/2018. 2.   Iron deficiency anemia    Hematocrit 42.2.  Hemoglobin 14.5.  MCV 88.7.  Ferritin 33 (borderline).  Discuss weekly Venofer x 2 as patient symptomatic. 3.   B12 deficiency   Patient receives B12 monthly at home (last 1-2 weeks ago).  Monitor folate yearly. 4.   Venofer today and in 1 week. 5.   RTC in 3 months for labs (CBC with diff, ferritin). 6.   RTC in 6 months for MD assessment, labs (CBC with diff,  ferritin, iron studies- day before) +/- Venofer.  I discussed the assessment and treatment plan with the patient.  The patient was provided an opportunity to ask questions and all were answered.  The patient agreed with the plan and demonstrated an understanding of the instructions.  The patient was advised to call back or seek an in person evaluation if the symptoms worsen or if the condition fails to improve as anticipated.    Lequita Asal, MD  05/25/2018, 5:24 PM

## 2018-05-25 ENCOUNTER — Inpatient Hospital Stay: Payer: Managed Care, Other (non HMO) | Admitting: Hematology and Oncology

## 2018-05-25 ENCOUNTER — Encounter: Payer: Self-pay | Admitting: Hematology and Oncology

## 2018-05-25 ENCOUNTER — Inpatient Hospital Stay: Payer: Managed Care, Other (non HMO)

## 2018-05-25 VITALS — BP 109/73 | HR 84 | Temp 98.8°F | Resp 18 | Ht 67.5 in | Wt 225.5 lb

## 2018-05-25 VITALS — BP 147/76 | HR 76 | Temp 97.6°F | Resp 18

## 2018-05-25 DIAGNOSIS — Z9884 Bariatric surgery status: Secondary | ICD-10-CM

## 2018-05-25 DIAGNOSIS — E538 Deficiency of other specified B group vitamins: Secondary | ICD-10-CM | POA: Diagnosis not present

## 2018-05-25 DIAGNOSIS — K9589 Other complications of other bariatric procedure: Secondary | ICD-10-CM

## 2018-05-25 DIAGNOSIS — D509 Iron deficiency anemia, unspecified: Secondary | ICD-10-CM

## 2018-05-25 DIAGNOSIS — D500 Iron deficiency anemia secondary to blood loss (chronic): Secondary | ICD-10-CM

## 2018-05-25 DIAGNOSIS — D508 Other iron deficiency anemias: Secondary | ICD-10-CM

## 2018-05-25 MED ORDER — SODIUM CHLORIDE 0.9 % IV SOLN
Freq: Once | INTRAVENOUS | Status: AC
  Administered 2018-05-25: 15:00:00 via INTRAVENOUS
  Filled 2018-05-25: qty 250

## 2018-05-25 MED ORDER — IRON SUCROSE 20 MG/ML IV SOLN
200.0000 mg | Freq: Once | INTRAVENOUS | Status: AC
  Administered 2018-05-25: 200 mg via INTRAVENOUS
  Filled 2018-05-25: qty 10

## 2018-05-25 MED ORDER — SODIUM CHLORIDE 0.9 % IV SOLN: 200.0000 mg | Freq: Once | INTRAVENOUS | Status: DC

## 2018-05-25 NOTE — Patient Instructions (Signed)
Iron Sucrose injection What is this medicine? IRON SUCROSE (AHY ern SOO krohs) is an iron complex. Iron is used to make healthy red blood cells, which carry oxygen and nutrients throughout the body. This medicine is used to treat iron deficiency anemia in people with chronic kidney disease. This medicine may be used for other purposes; ask your health care provider or pharmacist if you have questions. COMMON BRAND NAME(S): Venofer What should I tell my health care provider before I take this medicine? They need to know if you have any of these conditions: -anemia not caused by low iron levels -heart disease -high levels of iron in the blood -kidney disease -liver disease -an unusual or allergic reaction to iron, other medicines, foods, dyes, or preservatives -pregnant or trying to get pregnant -breast-feeding How should I use this medicine? This medicine is for infusion into a vein. It is given by a health care professional in a hospital or clinic setting. Talk to your pediatrician regarding the use of this medicine in children. While this drug may be prescribed for children as young as 2 years for selected conditions, precautions do apply. Overdosage: If you think you have taken too much of this medicine contact a poison control center or emergency room at once. NOTE: This medicine is only for you. Do not share this medicine with others. What if I miss a dose? It is important not to miss your dose. Call your doctor or health care professional if you are unable to keep an appointment. What may interact with this medicine? Do not take this medicine with any of the following medications: -deferoxamine -dimercaprol -other iron products This medicine may also interact with the following medications: -chloramphenicol -deferasirox This list may not describe all possible interactions. Give your health care provider a list of all the medicines, herbs, non-prescription drugs, or dietary  supplements you use. Also tell them if you smoke, drink alcohol, or use illegal drugs. Some items may interact with your medicine. What should I watch for while using this medicine? Visit your doctor or healthcare professional regularly. Tell your doctor or healthcare professional if your symptoms do not start to get better or if they get worse. You may need blood work done while you are taking this medicine. You may need to follow a special diet. Talk to your doctor. Foods that contain iron include: whole grains/cereals, dried fruits, beans, or peas, leafy green vegetables, and organ meats (liver, kidney). What side effects may I notice from receiving this medicine? Side effects that you should report to your doctor or health care professional as soon as possible: -allergic reactions like skin rash, itching or hives, swelling of the face, lips, or tongue -breathing problems -changes in blood pressure -cough -fast, irregular heartbeat -feeling faint or lightheaded, falls -fever or chills -flushing, sweating, or hot feelings -joint or muscle aches/pains -seizures -swelling of the ankles or feet -unusually weak or tired Side effects that usually do not require medical attention (report to your doctor or health care professional if they continue or are bothersome): -diarrhea -feeling achy -headache -irritation at site where injected -nausea, vomiting -stomach upset -tiredness This list may not describe all possible side effects. Call your doctor for medical advice about side effects. You may report side effects to FDA at 1-800-FDA-1088. Where should I keep my medicine? This drug is given in a hospital or clinic and will not be stored at home. NOTE: This sheet is a summary. It may not cover all possible information. If   you have questions about this medicine, talk to your doctor, pharmacist, or health care provider.  2019 Elsevier/Gold Standard (2010-12-27 17:14:35)  

## 2018-05-25 NOTE — Progress Notes (Signed)
No new changes noted today 

## 2018-05-26 ENCOUNTER — Encounter: Payer: Self-pay | Admitting: Internal Medicine

## 2018-05-26 ENCOUNTER — Other Ambulatory Visit: Payer: Self-pay | Admitting: Internal Medicine

## 2018-05-26 ENCOUNTER — Ambulatory Visit: Payer: Managed Care, Other (non HMO) | Admitting: Internal Medicine

## 2018-05-26 ENCOUNTER — Other Ambulatory Visit: Payer: Self-pay

## 2018-05-26 VITALS — BP 126/72 | HR 79 | Temp 97.9°F | Resp 16 | Wt 223.9 lb

## 2018-05-26 DIAGNOSIS — I10 Essential (primary) hypertension: Secondary | ICD-10-CM

## 2018-05-26 DIAGNOSIS — Z1239 Encounter for other screening for malignant neoplasm of breast: Secondary | ICD-10-CM | POA: Diagnosis not present

## 2018-05-26 DIAGNOSIS — D500 Iron deficiency anemia secondary to blood loss (chronic): Secondary | ICD-10-CM

## 2018-05-26 DIAGNOSIS — R739 Hyperglycemia, unspecified: Secondary | ICD-10-CM | POA: Diagnosis not present

## 2018-05-26 DIAGNOSIS — E039 Hypothyroidism, unspecified: Secondary | ICD-10-CM

## 2018-05-26 DIAGNOSIS — E7800 Pure hypercholesterolemia, unspecified: Secondary | ICD-10-CM | POA: Diagnosis not present

## 2018-05-26 DIAGNOSIS — F32A Depression, unspecified: Secondary | ICD-10-CM

## 2018-05-26 DIAGNOSIS — F320 Major depressive disorder, single episode, mild: Secondary | ICD-10-CM

## 2018-05-26 DIAGNOSIS — Z86010 Personal history of colonic polyps: Secondary | ICD-10-CM

## 2018-05-26 DIAGNOSIS — D039 Melanoma in situ, unspecified: Secondary | ICD-10-CM

## 2018-05-26 DIAGNOSIS — K219 Gastro-esophageal reflux disease without esophagitis: Secondary | ICD-10-CM

## 2018-05-26 DIAGNOSIS — R600 Localized edema: Secondary | ICD-10-CM

## 2018-05-26 LAB — COMPREHENSIVE METABOLIC PANEL
ALT: 23 U/L (ref 0–35)
AST: 11 U/L (ref 0–37)
Albumin: 4.4 g/dL (ref 3.5–5.2)
Alkaline Phosphatase: 50 U/L (ref 39–117)
BUN: 19 mg/dL (ref 6–23)
CO2: 36 mEq/L — ABNORMAL HIGH (ref 19–32)
Calcium: 9.5 mg/dL (ref 8.4–10.5)
Chloride: 88 mEq/L — ABNORMAL LOW (ref 96–112)
Creatinine, Ser: 0.82 mg/dL (ref 0.40–1.20)
GFR: 73.11 mL/min (ref >60.00)
Glucose, Bld: 101 mg/dL — ABNORMAL HIGH (ref 70–99)
Potassium: 2.9 mEq/L — ABNORMAL LOW (ref 3.5–5.1)
SODIUM: 136 meq/L (ref 135–145)
Total Bilirubin: 0.5 mg/dL (ref 0.2–1.2)
Total Protein: 6.7 g/dL (ref 6.0–8.3)

## 2018-05-26 LAB — LIPID PANEL
CHOL/HDL RATIO: 3
Cholesterol: 178 mg/dL (ref 0–200)
HDL: 62.6 mg/dL (ref >39.00)
NonHDL: 115.37
Triglycerides: 256 mg/dL — ABNORMAL HIGH (ref 0.0–149.0)
VLDL: 51.2 mg/dL — ABNORMAL HIGH (ref 0.0–40.0)

## 2018-05-26 LAB — TSH: TSH: 0.57 u[IU]/mL (ref 0.35–4.50)

## 2018-05-26 LAB — HEMOGLOBIN A1C: HEMOGLOBIN A1C: 5.8 % (ref 4.6–6.5)

## 2018-05-26 LAB — LDL CHOLESTEROL, DIRECT: Direct LDL: 106 mg/dL

## 2018-05-26 MED ORDER — POTASSIUM CHLORIDE CRYS ER 20 MEQ PO TBCR: 20.0000 meq | EXTENDED_RELEASE_TABLET | Freq: Two times a day (BID) | ORAL | 1 refills | Status: DC

## 2018-05-26 MED ORDER — MUPIROCIN 2 % EX OINT: 1.0000 "application " | TOPICAL_OINTMENT | Freq: Two times a day (BID) | CUTANEOUS | 0 refills | Status: DC

## 2018-05-26 NOTE — Telephone Encounter (Signed)
rx sent in 

## 2018-05-26 NOTE — Telephone Encounter (Signed)
Pt requesting refill on potassium with the label, 20 meq bid. Recent change in prescription after lab results.

## 2018-05-26 NOTE — Progress Notes (Signed)
Subjective:    Patient ID: Lori Le, female    DOB: 1965/10/23, 53 y.o.   MRN: [id number]  HPI  Patient here for a scheduled follow up.  She reports she is doing better.  Feels better.  Has adjusted her diet.  Lower extremity swelling is better.  Seeing Dr Mike Gip for f/u regarding her anemia.  Is s/p colonoscopy 05/11/18.  Recommended f/u colonoscopy in 3 years.  Also had EGD 05/11/18.  On carafate.  Bowels more regular.  Denies acid reflux.  Hematology recommended f/u labs in 3 months with f/u office visit in 6 months.  Also being followed by psychiatry.  Now on zoloft and doing better on this medication.  Handling stress.  Seeing neurology (Dr Melrose Nakayama).  On gabapentin.  Helping.  No nausea or vomiting.  No abdominal pain.  Overall feels better.     Past Medical History:  Diagnosis Date  . Allergic rhinitis due to allergen   . Allergy   . Anemia   . Arthritis   . Cancer ([place])    skin ca  . Chronic idiopathic urticaria   . Complication of anesthesia    nausea, slow to wake up  . Depression   . GERD (gastroesophageal reflux disease)   . Gout   . Gout   . Hashimoto's thyroiditis   . Heart murmur   . History of eating disorder   . Hives   . Hyperlipidemia   . Hypertension   . Hypothyroidism   . Lower extremity edema   . Melanoma in situ ([place])    left shoulder  . Migraine   . Mitral valve disorder   . Motion sickness    all moving vehicles  . PCOS (polycystic ovarian syndrome)   . PONV (postoperative nausea and vomiting)   . Thyroid disease   . Ulcer   . Vertigo    last episode over 1 yr ago   Past Surgical History:  Procedure Laterality Date  . BREAST BIOPSY Right    neg- bx/clip  . CHOLECYSTECTOMY  2003  . COLONOSCOPY WITH PROPOFOL N/A 05/11/2018   Procedure: COLONOSCOPY WITH PROPOFOL;  Surgeon: [name], MD;  Location: [facility] ENDOSCOPY;  Service: Endoscopy;  Laterality: N/A;  . DILITATION & CURRETTAGE/HYSTROSCOPY WITH NOVASURE ABLATION N/A 08/19/2016     Procedure: DILATATION & CURETTAGE/HYSTEROSCOPY WITH NOVASURE ABLATION;  Surgeon: [name], MD;  Location: [facility] ORS;  Service: Gynecology;  Laterality: N/A;  . ESOPHAGOGASTRODUODENOSCOPY (EGD) WITH PROPOFOL N/A 12/05/2015   Procedure: ESOPHAGOGASTRODUODENOSCOPY (EGD) WITH PROPOFOL;  Surgeon: Lollie Sails, MD;  Location: [facility] ENDOSCOPY;  Service: Endoscopy;  Laterality: N/A;  . ESOPHAGOGASTRODUODENOSCOPY (EGD) WITH PROPOFOL N/A 02/06/2016   Procedure: ESOPHAGOGASTRODUODENOSCOPY (EGD) WITH PROPOFOL;  Surgeon: Lollie Sails, MD;  Location: [facility] ENDOSCOPY;  Service: Endoscopy;  Laterality: N/A;  . ESOPHAGOGASTRODUODENOSCOPY (EGD) WITH PROPOFOL N/A 05/11/2018   Procedure: ESOPHAGOGASTRODUODENOSCOPY (EGD) WITH PROPOFOL;  Surgeon: [name], MD;  Location: [facility] ENDOSCOPY;  Service: Endoscopy;  Laterality: N/A;  . FOOT SURGERY  1994  . GASTRIC BYPASS  2011  . JOINT REPLACEMENT Left 09/27/2014   HIP  . JOINT REPLACEMENT Left 12/01/2014   KNEE  . KNEE ARTHROSCOPY Right   . KNEE ARTHROSCOPY Left 03/23/2015   Procedure: ARTHROSCOPY KNEE, partial synovectomy;  Surgeon: Hessie Knows, MD;  Location: [facility] ORS;  Service: Orthopedics;  Laterality: Left;  . LAPAROSCOPIC GASTRIC BANDING  2010  . ROTATOR CUFF REPAIR Bilateral    right shoulder 07-26-2016  . SHOULDER ARTHROSCOPY Left  06/20/2015   Procedure: ARTHROSCOPY SHOULDER, REPAIR OF MASSIVE ROTATOR CUFF TEAR, TENODESIS, DECOMPRESSION DEBRIDEMENT;  Surgeon: Corky Mull, MD;  Location: [facility] ORS;  Service: Orthopedics;  Laterality: Left;  . TOTAL HIP ARTHROPLASTY Left 09/27/2014   Procedure: TOTAL HIP ARTHROPLASTY ANTERIOR APPROACH;  Surgeon: Hessie Knows, MD;  Location: [facility] ORS;  Service: Orthopedics;  Laterality: Left;  . TOTAL KNEE ARTHROPLASTY Left 12/01/2014   Procedure: TOTAL KNEE ARTHROPLASTY;  Surgeon: Hessie Knows, MD;  Location: [facility] ORS;  Service: Orthopedics;  Laterality: Left;   Family History  Problem Relation Age of Onset   . Arthritis Mother   . Hyperlipidemia Mother   . Hypertension Mother   . Diabetes Mother   . Arthritis Father   . Hyperlipidemia Father   . Hypertension Father   . Mental illness Father   . Diabetes Father   . Arthritis Maternal Grandmother   . Cancer Maternal Grandmother        breast cancer  . Hyperlipidemia Maternal Grandmother   . Hypertension Maternal Grandmother   . Breast cancer Maternal Grandmother   . Arthritis Maternal Grandfather   . Hyperlipidemia Maternal Grandfather   . Hypertension Maternal Grandfather   . Heart disease Maternal Grandfather        heart attack  . Breast cancer Paternal Grandmother    Social History   Socioeconomic History  . Marital status: Divorced    Spouse name: Not on file  . Number of children: Not on file  . Years of education: Not on file  . Highest education level: Not on file  Occupational History  . Not on file  Social Needs  . Financial resource strain: Not on file  . Food insecurity:    Worry: Not on file    Inability: Not on file  . Transportation needs:    Medical: Not on file    Non-medical: Not on file  Tobacco Use  . Smoking status: Never Smoker  . Smokeless tobacco: Never Used  Substance and Sexual Activity  . Alcohol use: Yes    Alcohol/week: 0.0 - 1.0 standard drinks    Comment: occ.  . Drug use: No  . Sexual activity: Yes    Partners: Male    Birth control/protection: Surgical    Comment: Ablation   Lifestyle  . Physical activity:    Days per week: Not on file    Minutes per session: Not on file  . Stress: Not on file  Relationships  . Social connections:    Talks on phone: Not on file    Gets together: Not on file    Attends religious service: Not on file    Active member of club or organization: Not on file    Attends meetings of clubs or organizations: Not on file    Relationship status: Not on file  Other Topics Concern  . Not on file  Social History Narrative  . Not on file    Outpatient  Encounter Medications as of 05/26/2018  Medication Sig  . acetaminophen (TYLENOL) 650 MG CR tablet Take 1,300 mg by mouth 2 (two) times daily.  [name] allopurinol (ZYLOPRIM) 300 MG tablet TAKE 1 TABLET BY MOUTH EVERY DAY  . azelastine (ASTELIN) 0.1 % nasal spray U 1 TO 2 SPRAYS IEN BID  . colchicine 0.6 MG tablet Take 1 tablet (0.6 mg total) by mouth 2 (two) times daily.  . cyanocobalamin (,VITAMIN B-12,) 1000 MCG/ML injection INJECT 1 ML INTO THE MUSCLE EVERY 30 DAYS  . DYMISTA  137-50 MCG/ACT SUSP SPRAY 1 SPRAY INTO EACH NOSTRIL BID  . furosemide (LASIX) 40 MG tablet TAKE 1 TABLET BY MOUTH DAILY  . gabapentin (NEURONTIN) 100 MG capsule Take 200 mg by mouth 2 (two) times daily.  [name] levocetirizine (XYZAL) 5 MG tablet every evening.  [name] levothyroxine (SYNTHROID, LEVOTHROID) 137 MCG tablet TAKE ONE TABLET BY MOUTH ONCE DAILY BEFORE BREAKFAST (Patient taking differently: Take 137 mcg by mouth daily before breakfast. DAILY BEFORE BREAKFAST)  . metolazone (ZAROXOLYN) 5 MG tablet TK 1 T PO D 60 MINUTES PRIOR TO TAKING MAXIDE AND FUROSEMIDE  . montelukast (SINGULAIR) 10 MG tablet Take 10 mg by mouth daily.  . mupirocin ointment (BACTROBAN) 2 % Place 1 application into the nose 2 (two) times daily.  . pantoprazole (PROTONIX) 40 MG tablet Take 1 tablet (40 mg total) by mouth 2 (two) times daily for 30 days.  [name] sertraline (ZOLOFT) 50 MG tablet Take 1 tablet (50 mg total) by mouth daily.  . sucralfate (CARAFATE) 1 GM/10ML suspension Take 10 mLs (1 g total) by mouth 4 (four) times daily for 30 days.  . SYRINGE-NEEDLE, DISP, 3 ML (B-D INTEGRA SYRINGE) 23G X 1" 3 ML MISC USE TO ADMINISTER PARENTERAL B12 SUPPLEMENTATION (Patient not taking: Reported on 05/25/2018)  . triamterene-hydrochlorothiazide (MAXZIDE-25) 37.5-25 MG tablet TAKE 1 TABLET BY MOUTH EVERY DAY  . triamterene-hydrochlorothiazide (MAXZIDE-25) 37.5-25 MG tablet TAKE 1 TABLET BY MOUTH EVERY DAY  . Vitamin D, Ergocalciferol, (DRISDOL) 1.25 MG (50000 UT)  CAPS capsule Take 50,000 Units by mouth every 7 (seven) days.  . [DISCONTINUED] mupirocin ointment (BACTROBAN) 2 % Place 1 application into the nose 2 (two) times daily. (Patient not taking: Reported on 05/25/2018)  . [DISCONTINUED] potassium chloride (K-DUR,KLOR-CON) 10 MEQ tablet TAKE 1 TABLET BY MOUTH TWICE DAILY   Facility-Administered Encounter Medications as of 05/26/2018  Medication  . 0.9 %  sodium chloride infusion  . cyanocobalamin ((VITAMIN B-12)) injection 1,000 mcg    Review of Systems  Constitutional: Negative for appetite change and unexpected weight change.  HENT: Negative for congestion and sinus pressure.   Respiratory: Negative for cough, chest tightness and shortness of breath.   Cardiovascular: Negative for chest pain and palpitations.       Leg swelling better.    Gastrointestinal: Negative for abdominal pain, nausea and vomiting.  Genitourinary: Negative for difficulty urinating and dysuria.  Musculoskeletal: Negative for joint swelling and myalgias.       Leg pain better.   Skin: Negative for color change and rash.  Neurological: Negative for dizziness, light-headedness and headaches.  Psychiatric/Behavioral: Negative for agitation and dysphoric mood.       Objective:    Physical Exam Constitutional:      General: She is not in acute distress.    Appearance: Normal appearance.  HENT:     Nose: Nose normal. No congestion.     Mouth/Throat:     Pharynx: No oropharyngeal exudate or posterior oropharyngeal erythema.  Neck:     Musculoskeletal: Neck supple. No muscular tenderness.     Thyroid: No thyromegaly.  Cardiovascular:     Rate and Rhythm: Normal rate and regular rhythm.  Pulmonary:     Effort: No respiratory distress.     Breath sounds: Normal breath sounds. No wheezing.  Abdominal:     General: Bowel sounds are normal.     Palpations: Abdomen is soft.     Tenderness: There is no abdominal tenderness.  Musculoskeletal:        General: No  swelling or tenderness.  Lymphadenopathy:     Cervical: No cervical adenopathy.  Skin:    Findings: No erythema or rash.  Neurological:     Mental Status: She is alert.  Psychiatric:        Mood and Affect: Mood normal.        Behavior: Behavior normal.     BP 126/72   Pulse 79   Temp 97.9 F (36.6 C) (Oral)   Resp 16   Wt 223 lb 14.4 oz (101.6 kg)   LMP  (LMP Unknown)   SpO2 97%   BMI 34.55 kg/m  Wt Readings from Last 3 Encounters:  05/26/18 223 lb 14.4 oz (101.6 kg)  05/25/18 225 lb 8.5 oz (102.3 kg)  05/11/18 226 lb (102.5 kg)     Lab Results  Component Value Date   WBC 8.0 05/22/2018   HGB 14.5 05/22/2018   HCT 42.2 05/22/2018   PLT 364 05/22/2018   GLUCOSE 101 (H) 05/26/2018   CHOL 178 05/26/2018   TRIG 256.0 (H) 05/26/2018   HDL 62.60 05/26/2018   LDLDIRECT 106.0 05/26/2018   LDLCALC 82 04/10/2017   ALT 23 05/26/2018   AST 11 05/26/2018   NA 136 05/26/2018   K 2.9 (L) 05/26/2018   CL 88 (L) 05/26/2018   CREATININE 0.82 05/26/2018   BUN 19 05/26/2018   CO2 36 (H) 05/26/2018   TSH 0.57 05/26/2018   INR 0.99 03/08/2016   HGBA1C 5.8 05/26/2018       Assessment & Plan:   Problem List Items Addressed This Visit    Anemia    Being followed by hematology.        Bilateral lower extremity edema    Swelling better.  Follow.        Essential hypertension, benign    Blood pressure under good control.  Continue same medication regimen.  Follow pressures.  Follow metabolic panel.        GERD (gastroesophageal reflux disease)    Just had EGD 05/11/18.  Placed on carafate.  No upper symptoms reported.       History of colon polyps    Colonoscopy 05/11/18.  Recommended f/u colonoscopy in 3 years.       Hypercholesterolemia - Primary    Low cholesterol diet and exercise.  Follow lipid panel.       Relevant Orders   Lipid panel (Completed)   Comprehensive metabolic panel (Completed)   Hyperglycemia    Low carb diet and exercise.  Follow met b  and a1c.       Relevant Orders   Hemoglobin A1c (Completed)   Hypothyroidism    On thyroid replacement.  Follow tsh.       Relevant Orders   TSH (Completed)   Melanoma in situ ([place])    Followed by dermatology.        Mild depression ([place])    Seeing psychiatry.  Doing better on zoloft.        Other Visit Diagnoses    Breast cancer screening       Relevant Orders   MM 3D SCREEN BREAST BILATERAL       Einar Pheasant, MD

## 2018-05-26 NOTE — Telephone Encounter (Signed)
Pt requesting 3 months worth of potassium.

## 2018-05-27 ENCOUNTER — Ambulatory Visit: Payer: Managed Care, Other (non HMO) | Attending: Internal Medicine

## 2018-05-27 ENCOUNTER — Encounter: Payer: Self-pay | Admitting: Gastroenterology

## 2018-05-27 DIAGNOSIS — M533 Sacrococcygeal disorders, not elsewhere classified: Secondary | ICD-10-CM

## 2018-05-27 DIAGNOSIS — R293 Abnormal posture: Secondary | ICD-10-CM | POA: Insufficient documentation

## 2018-05-27 DIAGNOSIS — N393 Stress incontinence (female) (male): Secondary | ICD-10-CM | POA: Diagnosis present

## 2018-05-27 DIAGNOSIS — M62830 Muscle spasm of back: Secondary | ICD-10-CM

## 2018-05-27 DIAGNOSIS — M62838 Other muscle spasm: Secondary | ICD-10-CM | POA: Insufficient documentation

## 2018-05-27 NOTE — Patient Instructions (Signed)
Self Internal Trigger Point Relief    1) Wash your hands and prop yourself up in a way where you can easily reach the vagina. You may wish to have a small hand-held mirror near by.  2) lubricate the tool and vaginal opening using a hypoallergenic lubricant such as "slippery-stuff".   3) Slowly and gently insert the tool into the vagina using deep breaths to allow relaxation of the muscles around the tool.  4) Avoiding the "12 o-clock" region near the urethra, gently use the handle of the tool like a lever to press the angled tip of the tool onto the wall of the pelvic floor.   5) Move the tool to different areas of the pelvic floor and feel for areas that are tender called "trigger points". When you find one hold the tool still, applying just enough pressure to elicit mild discomfort and take deep belly breaths until the discomfort subsides or decreases by at least 50%.   6) Repeat the process for any trigger points you find spending between 3-10 minutes on this per night until you do not find any more trigger points or you are told otherwise by your therapist..   

## 2018-05-27 NOTE — Therapy (Signed)
[place] MAIN [facility] SERVICES [address] [place], [place], [address] Phone: [phone]   Fax:  [phone]  Physical Therapy Treatment  Patient Details  Name: Lori Le MRN: [id number] Date of Birth: 1966/03/25 Referring Provider (PT): Einar Pheasant   Encounter Date: 05/27/2018  PT End of Session - 05/28/18 1906    Visit Number  5    Number of Visits  24    Date for PT Re-Evaluation  04/08/18    Authorization - Visit Number  5    Authorization - Number of Visits  12    PT Start Time  2683    PT Stop Time  4196    PT Time Calculation (min)  60 min    Activity Tolerance  Patient tolerated treatment well;Other (comment)    Behavior During Therapy  WFL for tasks assessed/performed       Past Medical History:  Diagnosis Date  . Allergic rhinitis due to allergen   . Allergy   . Anemia   . Arthritis   . Cancer ([place])    skin ca  . Chronic idiopathic urticaria   . Complication of anesthesia    nausea, slow to wake up  . Depression   . GERD (gastroesophageal reflux disease)   . Gout   . Gout   . Hashimoto's thyroiditis   . Heart murmur   . History of eating disorder   . Hives   . Hyperlipidemia   . Hypertension   . Hypothyroidism   . Lower extremity edema   . Melanoma in situ ([place])    left shoulder  . Migraine   . Mitral valve disorder   . Motion sickness    all moving vehicles  . PCOS (polycystic ovarian syndrome)   . PONV (postoperative nausea and vomiting)   . Thyroid disease   . Ulcer   . Vertigo    last episode over 1 yr ago    Past Surgical History:  Procedure Laterality Date  . BREAST BIOPSY Right    neg- bx/clip  . CHOLECYSTECTOMY  2003  . COLONOSCOPY WITH PROPOFOL N/A 05/11/2018   Procedure: COLONOSCOPY WITH PROPOFOL;  Surgeon: [name], MD;  Location: [facility] ENDOSCOPY;  Service: Endoscopy;  Laterality: N/A;  . DILITATION & CURRETTAGE/HYSTROSCOPY WITH NOVASURE ABLATION N/A 08/19/2016    Procedure: DILATATION & CURETTAGE/HYSTEROSCOPY WITH NOVASURE ABLATION;  Surgeon: [name], MD;  Location: [facility] ORS;  Service: Gynecology;  Laterality: N/A;  . ESOPHAGOGASTRODUODENOSCOPY (EGD) WITH PROPOFOL N/A 12/05/2015   Procedure: ESOPHAGOGASTRODUODENOSCOPY (EGD) WITH PROPOFOL;  Surgeon: Lollie Sails, MD;  Location: [facility] ENDOSCOPY;  Service: Endoscopy;  Laterality: N/A;  . ESOPHAGOGASTRODUODENOSCOPY (EGD) WITH PROPOFOL N/A 02/06/2016   Procedure: ESOPHAGOGASTRODUODENOSCOPY (EGD) WITH PROPOFOL;  Surgeon: Lollie Sails, MD;  Location: [facility] ENDOSCOPY;  Service: Endoscopy;  Laterality: N/A;  . ESOPHAGOGASTRODUODENOSCOPY (EGD) WITH PROPOFOL N/A 05/11/2018   Procedure: ESOPHAGOGASTRODUODENOSCOPY (EGD) WITH PROPOFOL;  Surgeon: [name], MD;  Location: [facility] ENDOSCOPY;  Service: Endoscopy;  Laterality: N/A;  . FOOT SURGERY  1994  . GASTRIC BYPASS  2011  . JOINT REPLACEMENT Left 09/27/2014   HIP  . JOINT REPLACEMENT Left 12/01/2014   KNEE  . KNEE ARTHROSCOPY Right   . KNEE ARTHROSCOPY Left 03/23/2015   Procedure: ARTHROSCOPY KNEE, partial synovectomy;  Surgeon: Hessie Knows, MD;  Location: [facility] ORS;  Service: Orthopedics;  Laterality: Left;  . LAPAROSCOPIC GASTRIC BANDING  2010  . ROTATOR CUFF REPAIR Bilateral    right shoulder 07-26-2016  .  SHOULDER ARTHROSCOPY Left 06/20/2015   Procedure: ARTHROSCOPY SHOULDER, REPAIR OF MASSIVE ROTATOR CUFF TEAR, TENODESIS, DECOMPRESSION DEBRIDEMENT;  Surgeon: Corky Mull, MD;  Location: [facility] ORS;  Service: Orthopedics;  Laterality: Left;  . TOTAL HIP ARTHROPLASTY Left 09/27/2014   Procedure: TOTAL HIP ARTHROPLASTY ANTERIOR APPROACH;  Surgeon: Hessie Knows, MD;  Location: [facility] ORS;  Service: Orthopedics;  Laterality: Left;  . TOTAL KNEE ARTHROPLASTY Left 12/01/2014   Procedure: TOTAL KNEE ARTHROPLASTY;  Surgeon: Hessie Knows, MD;  Location: [facility] ORS;  Service: Orthopedics;  Laterality: Left;    There were no vitals filed for this  visit.    Pelvic Floor Physical Therapy Treatment Note  SCREENING  Changes in medications, allergies, or medical history?: none    SUBJECTIVE  Patient reports: She is doing well is on whole 30 diet for alpha-gal and not having swelling. Has only had one episode of leakage several weeks ago with urge. Is not having any leakage with sneezing, coughing, etc.    Precautions:  lymphedema  Pain update: none  Patient Goals: To not have incontinence and to be able to come off of medication and have regular bowel movements.   OBJECTIVE  Changes in: Posture/Observations:  Much less swelling present in B LE but RLE continues to demonstrate more than L.  Range of Motion/Flexibilty:  ~ 30 degrees of hip ER in hook-lying pre-treatment. Following treatment, L hip ER at ~ 55 deg. And R at 45 deg.  Palpation: TTP to B adductors  INTERVENTIONS THIS SESSION: Self-care: educated on how to manage heel-lift and wedge better for improved comfort and to allow for increased wear to prevent return of Sx. Discussed how to perform self internal TP release to continue to decrease PFM spasm and allow for decreased constipation. Manual: Performed STM and TP release   to B adductors to decrease pain and spasm and allow for improved hip ABD and ER and decreased referred tightness into the PFM for decreased constipation and SUI. Dry-needle: Performed TPDN with a .30x65mm needle to B adductors to decrease pain and spasm and allow for improved hip ABD and ER and decreased referred tightness into the PFM for decreased constipation and SUI.  Total time: 60 min.                   Trigger Point Dry Needling - 05/28/18 1527    Consent Given?  Yes    Education Handout Provided  No    Muscles Treated Lower Body  Adductor longus/brevius/maximus    Adductor Response  Twitch response elicited;Palpable increased muscle length           PT Education - 05/28/18 1906    Education Details   See Pt. Instructions and Interventions this session.    Person(s) Educated  Patient    Methods  Explanation    Comprehension  Verbalized understanding       PT Short Term Goals - 02/25/18 0857      PT SHORT TERM GOAL #1   Title  Patient will demonstrate a coordinated contraction, relaxation, and bulge of the pelvic floor muscles to demonstrate functional recruitment and motion and allow for further strengthening.    Baseline  Pt. does not demonstrate appropriate recruitment/coordination or strength of PFM for decreasing SUI    Time  6    Period  Weeks    Status  New    Target Date  04/08/18      PT SHORT TERM GOAL #2   Title  Patient will  demonstrate coordinated diaphragmatic breathing with pelvic tilts to demonstrate improved control of diaphragm and TA, to allow for further strengthening of core musculature and decreased pelvic floor spasm.    Baseline  Pt. lacks knowledge of how to coordinate core to engage PFM and support the abdomen to prevent SUI    Time  6    Period  Weeks    Status  New    Target Date  04/08/18      PT SHORT TERM GOAL #3   Title  Patient will demonstrate improved sitting and standing posture to demonstrate learning and decrease stress on the pelvic floor with functional activity.    Baseline  Hyperlordotic    Time  6    Period  Weeks    Status  New    Target Date  04/08/18      PT SHORT TERM GOAL #4   Title  Patient will demonstrate improved pelvic alignment and balance of musculature surrounding the pelvis to facilitate decreased PFM spasms and decrease pelvic pain.    Baseline  LLE 1.5 cm long, spasms surrounding pelvis in R>L    Time  6    Period  Weeks    Status  New    Target Date  04/08/18      PT SHORT TERM GOAL #5   Title  Patient will demonstrate HEP x1 in the clinic to demonstrate understanding and proper form to allow for further improvement.    Baseline  Pt. lacks knowledge of therapeutic exercises that can help her reduce pain and  incontinence.    Time  6    Period  Weeks    Status  New    Target Date  04/08/18        PT Long Term Goals - 02/24/18 1652      PT LONG TERM GOAL #1   Title  Patient will report no episodes of SUI over the course of the prior two weeks to demonstrate improved functional ability.    Baseline  Having small amounts leakage with cough, sneeze, bending, lifting, and even getting into/out of bed.    Time  12    Period  Weeks    Status  New    Target Date  05/20/18      PT LONG TERM GOAL #2   Title  Patient will score at or below 38/300 on the PFDI and 25/300 on the PFIQ to demonstrate a clinically meaningful decrease in disability and distress due to pelvic floor dysfunction.    Baseline  PFDI:83/300, PFIQ: 70/300    Time  12    Period  Weeks    Status  New    Target Date  05/20/17      PT LONG TERM GOAL #3   Title  Patient will describe pain no greater than 2/10 during standing and walking up to 45 min to demonstrate improved functional ability and to allow her to participate in aerobic activity for health improvement and edema management.    Baseline  Pain increases to 8/10 with standing and walking>15 min.    Time  12    Period  Weeks    Status  New    Target Date  05/20/18      PT LONG TERM GOAL #4   Title  Pt will demo decreased abdominal separation from 8 fingers width to < 4 fingers width during SLR test in order to progress to higher functional exercise routines w/ decreased risk for injuries  Baseline  8 finger separation with SLR test, 4 with chin-tuck/crunch    Time  12    Period  Weeks    Status  New    Target Date  05/20/18      PT LONG TERM GOAL #5   Title  Patient will report having BM's at least every-other day with consistency between [facility] stool scale 3-5 over the prior week to demonstrate decreased constipation.    Baseline  Pt. having to strain, having irregular consistency and needing to use Magnezium citrate to have a BM every-other day.    Time  12     Period  Weeks    Status  New    Target Date  05/20/18            Plan - 05/28/18 1908    Clinical Impression Statement  Pt. responded well to all interventions today, demonstrating decreased spasm, increased ROM and understanding of all education provided. Continue per POC.    Rehab Potential  Good    Clinical Impairments Affecting Rehab Potential  Obesity, leg-length discrepancy, Idopathic edema and peripheral neuropathy, extensive surgical and medical history, good attitude and motivation, appeaprs to be compliant with medical reccomendations     PT Frequency  2x / week    PT Duration  12 weeks    PT Treatment/Interventions  ADLs/Self Care Home Management;Aquatic Therapy;Biofeedback;Electrical Stimulation;Traction;Moist Heat;Gait training;Functional mobility training;Therapeutic activities;Therapeutic exercise;Balance training;Patient/family education;Neuromuscular re-education;Manual techniques;Dry needling;Taping;Spinal Manipulations;Joint Manipulations    PT Next Visit Plan  DN to adductors and HS on long-leg. Educate on internal release with tool, work on recruitment and coordination of PFM. Biofeedback if necessary eventually    Consulted and Agree with Plan of Care  Patient       Patient will benefit from skilled therapeutic intervention in order to improve the following deficits and impairments:  Abnormal gait, Increased fascial restricitons, Impaired sensation, Improper body mechanics, Pain, Decreased mobility, Decreased coordination, Increased muscle spasms, Postural dysfunction, Decreased activity tolerance, Decreased endurance, Decreased range of motion, Decreased strength, Impaired UE functional use, Decreased balance, Difficulty walking, Increased edema, Impaired flexibility, Obesity  Visit Diagnosis: Other muscle spasm  Muscle spasm of back  Sacrococcygeal disorders, not elsewhere classified  Abnormal posture  SUI (stress urinary incontinence,  female)     Problem List Patient Active Problem List   Diagnosis Date Noted  . History of colon polyps 05/13/2018  . Special screening for malignant neoplasms, colon   . Benign neoplasm of cecum   . Benign neoplasm of transverse colon   . Columnar epithelial-lined lower esophagus   . Gastrointestinal ulcer   . Hot flashes 01/12/2018  . Chronic venous insufficiency 12/18/2017  . Lymphedema 12/18/2017  . Iron deficiency anemia following bariatric surgery 10/28/2017  . Bilateral lower extremity edema 10/07/2017  . Hyperglycemia 03/23/2017  . Rotator cuff syndrome 04/24/2015  . Arthritis of knee, degenerative 04/24/2015  . Degenerative arthritis of hip 04/24/2015  . B12 deficiency 01/06/2015  . Primary osteoarthritis of knee 12/01/2014  . Anemia 11/22/2014  . Primary localized osteoarthritis of left hip 09/27/2014  . Gout 03/20/2014  . Excessive and frequent menstruation 08/10/2013  . Derangement of posterior horn of medial meniscus 08/10/2013  . Mitral valve disease 08/10/2013  . Edema 12/30/2012  . Essential hypertension, benign 08/02/2012  . Melanoma in situ ([place]) 08/02/2012  . Mild depression ([place]) 08/02/2012  . Environmental allergies 08/02/2012  . Hypercholesterolemia 08/02/2012  . Migraines 08/02/2012  . Hypothyroidism 08/02/2012  . PCOS (polycystic ovarian syndrome) 08/02/2012  .  GERD (gastroesophageal reflux disease) 08/02/2012  . H/O gastric bypass 08/02/2012  . Food allergy 07/19/2011   Willa Rough DPT, ATC Willa Rough 05/28/2018, 7:13 PM  [place] MAIN [facility] SERVICES [address] [place], [place], [address] Phone: [phone]   Fax:  [phone]  Name: MEMORIE YOKOYAMA MRN: [id number] Date of Birth: April 27, 1965

## 2018-05-28 ENCOUNTER — Encounter: Payer: Self-pay | Admitting: Internal Medicine

## 2018-05-28 NOTE — Assessment & Plan Note (Signed)
Colonoscopy 05/11/18.  Recommended f/u colonoscopy in 3 years.

## 2018-05-28 NOTE — Assessment & Plan Note (Signed)
Low cholesterol diet and exercise.  Follow lipid panel.   

## 2018-05-28 NOTE — Assessment & Plan Note (Signed)
Seeing psychiatry.  Doing better on zoloft.

## 2018-05-28 NOTE — Assessment & Plan Note (Signed)
Followed by dermatology

## 2018-05-28 NOTE — Assessment & Plan Note (Signed)
Low carb diet and exercise.  Follow met b and a1c.  

## 2018-05-28 NOTE — Assessment & Plan Note (Signed)
On thyroid replacement.  Follow tsh.  

## 2018-05-28 NOTE — Assessment & Plan Note (Signed)
Swelling better.  Follow.   

## 2018-05-28 NOTE — Assessment & Plan Note (Signed)
Being followed by hematology.   

## 2018-05-28 NOTE — Assessment & Plan Note (Signed)
Just had EGD 05/11/18.  Placed on carafate.  No upper symptoms reported.

## 2018-05-28 NOTE — Assessment & Plan Note (Signed)
Blood pressure under good control.  Continue same medication regimen.  Follow pressures.  Follow metabolic panel.   

## 2018-06-01 ENCOUNTER — Other Ambulatory Visit: Payer: Self-pay

## 2018-06-02 ENCOUNTER — Inpatient Hospital Stay: Payer: Managed Care, Other (non HMO) | Attending: Urgent Care

## 2018-06-02 ENCOUNTER — Other Ambulatory Visit: Payer: Self-pay

## 2018-06-02 VITALS — BP 117/75 | HR 71 | Temp 96.1°F | Resp 18

## 2018-06-02 DIAGNOSIS — D509 Iron deficiency anemia, unspecified: Secondary | ICD-10-CM | POA: Insufficient documentation

## 2018-06-02 DIAGNOSIS — D500 Iron deficiency anemia secondary to blood loss (chronic): Secondary | ICD-10-CM

## 2018-06-02 MED ORDER — SODIUM CHLORIDE 0.9 % IV SOLN: 200.0000 mg | Freq: Once | INTRAVENOUS | Status: DC

## 2018-06-02 MED ORDER — IRON SUCROSE 20 MG/ML IV SOLN
200.0000 mg | Freq: Once | INTRAVENOUS | Status: AC
  Administered 2018-06-02: 200 mg via INTRAVENOUS
  Filled 2018-06-02: qty 10

## 2018-06-02 MED ORDER — SODIUM CHLORIDE 0.9 % IV SOLN
INTRAVENOUS | Status: DC
  Administered 2018-06-02: 14:00:00 via INTRAVENOUS
  Filled 2018-06-02: qty 250

## 2018-06-02 NOTE — Progress Notes (Signed)
Cardiology Office Note  Date:  06/04/2018   ID:  Lori Le, DOB: 1965-12-31, MRN: [id number]  PCP:  Einar Pheasant, MD   Chief Complaint  Patient presents with  . New Patient (Initial Visit)    bilateral lower extremity edema SOB with exertion.Medications reviewed verbally with the patient.     HPI:  Ms. Lori Le is 53 y.o. female with a history of: Hot flashes Chronic venous insufficiency Mitral valve disease Essential hypertension, benign Hypercholesterolemia Migraines Hx of colon polyps Benign neoplasm of cecum Benign neoplasm of transverse colon Columnar epithelial-lined lower esophagus Gastrointestinal ulcer  Lymphedema Bilateral lower extremity edema Hyperglycemia Rotator cuff syndrome Arthritis of knee, degenerative Degenerative arthritis of hip B12 deficiency Primary osteoarthritis of knee Anemia Edema Excessive and frequent menstruation Derangement of posterior horn of medial meniscus  Melanoma in situ (HCC) Mild depression (HCC) GERD Polycystic ovarian syndrome (PCOS) Hypothyroidism Who presents in the office by referral from Dr. Einar Pheasant for consultation of her  bilateral lower extremity edema and shortness of breath on exertion.  INTERVAL HISTORY: The patient reports today for an initial visit and was referred by Dr. Einar Pheasant, MD for bilateral lower extremity edema and shortness of breath on exertion. She reports having long history of leg swelling, does not like to wear compression hose Previously seen by vein and vascular, has lymphedema compression pumps, uses these sporadically.  As her swelling is well controlled at this time Has been on Lasix 40 daily with HCTZ 25 for a long time, Recently noted hypokalemia and started on supplement potassium 20 twice daily  Her cholesterol is good and is not on any medication. She eats a lot of chicken and sashimi (raw fish), and avoids red meat. Has cut back on carbohydrates.  Trying to lose  weight  She has four ulcers since divorce. CBC is reviewed, no recent anemia  She has low energy due to B12 deficiency, but a B12 shot resolves it.  Shortness of breath when "B12 and other vitamins are low" No regular exercise program  She takes levothyroxine which keeps her TSH stable.   Father passed from cancer, but no cardiac disease.   Her skin stays dry and lips crack even though she drinks a lot of water. The lasix and HCTZ may be dehydrating her and depleting her potassium levels.   She stays active and is a non-smoker.  Today's Blood pressure 126/76 Total Chol 178/ LDL 106 HBA1C 5.8 CR 0.82 Glucose 101  EKG personally reviewed by myself on todays visit Shows normal sinus rhythm. 76 bpm.    OTHER PAST MEDICAL HISTORY REVIEWED BY ME FOR TODAY'S VISIT:   PMH:   has a past medical history of Allergic rhinitis due to allergen, Allergy, Anemia, Arthritis, Cancer ([place]), Chronic idiopathic urticaria, Complication of anesthesia, Depression, GERD (gastroesophageal reflux disease), Gout, Gout, Hashimoto's thyroiditis, Heart murmur, History of eating disorder, Hives, Hyperlipidemia, Hypertension, Hypothyroidism, Lower extremity edema, Melanoma in situ ([place]), Migraine, Mitral valve disorder, Motion sickness, PCOS (polycystic ovarian syndrome), PONV (postoperative nausea and vomiting), Thyroid disease, Ulcer, and Vertigo.  PSH:    Past Surgical History:  Procedure Laterality Date  . BREAST BIOPSY Right 2007   neg- bx/clip  . CHOLECYSTECTOMY  2003  . COLONOSCOPY WITH PROPOFOL N/A 05/11/2018   Procedure: COLONOSCOPY WITH PROPOFOL;  Surgeon: [name], MD;  Location: [facility] ENDOSCOPY;  Service: Endoscopy;  Laterality: N/A;  . DILITATION & CURRETTAGE/HYSTROSCOPY WITH NOVASURE ABLATION N/A 08/19/2016   Procedure: DILATATION & CURETTAGE/HYSTEROSCOPY WITH NOVASURE ABLATION;  Surgeon: [name], MD;  Location: [facility] ORS;  Service: Gynecology;  Laterality: N/A;  .  ESOPHAGOGASTRODUODENOSCOPY (EGD) WITH PROPOFOL N/A 12/05/2015   Procedure: ESOPHAGOGASTRODUODENOSCOPY (EGD) WITH PROPOFOL;  Surgeon: Lollie Sails, MD;  Location: [facility] ENDOSCOPY;  Service: Endoscopy;  Laterality: N/A;  . ESOPHAGOGASTRODUODENOSCOPY (EGD) WITH PROPOFOL N/A 02/06/2016   Procedure: ESOPHAGOGASTRODUODENOSCOPY (EGD) WITH PROPOFOL;  Surgeon: Lollie Sails, MD;  Location: [facility] ENDOSCOPY;  Service: Endoscopy;  Laterality: N/A;  . ESOPHAGOGASTRODUODENOSCOPY (EGD) WITH PROPOFOL N/A 05/11/2018   Procedure: ESOPHAGOGASTRODUODENOSCOPY (EGD) WITH PROPOFOL;  Surgeon: [name], MD;  Location: [facility] ENDOSCOPY;  Service: Endoscopy;  Laterality: N/A;  . FOOT SURGERY  1994  . GASTRIC BYPASS  2011  . JOINT REPLACEMENT Left 09/27/2014   HIP  . JOINT REPLACEMENT Left 12/01/2014   KNEE  . KNEE ARTHROSCOPY Right   . KNEE ARTHROSCOPY Left 03/23/2015   Procedure: ARTHROSCOPY KNEE, partial synovectomy;  Surgeon: Hessie Knows, MD;  Location: [facility] ORS;  Service: Orthopedics;  Laterality: Left;  . LAPAROSCOPIC GASTRIC BANDING  2010  . ROTATOR CUFF REPAIR Bilateral    right shoulder 07-26-2016  . SHOULDER ARTHROSCOPY Left 06/20/2015   Procedure: ARTHROSCOPY SHOULDER, REPAIR OF MASSIVE ROTATOR CUFF TEAR, TENODESIS, DECOMPRESSION DEBRIDEMENT;  Surgeon: Corky Mull, MD;  Location: [facility] ORS;  Service: Orthopedics;  Laterality: Left;  . TOTAL HIP ARTHROPLASTY Left 09/27/2014   Procedure: TOTAL HIP ARTHROPLASTY ANTERIOR APPROACH;  Surgeon: Hessie Knows, MD;  Location: [facility] ORS;  Service: Orthopedics;  Laterality: Left;  . TOTAL KNEE ARTHROPLASTY Left 12/01/2014   Procedure: TOTAL KNEE ARTHROPLASTY;  Surgeon: Hessie Knows, MD;  Location: [facility] ORS;  Service: Orthopedics;  Laterality: Left;    Current Outpatient Medications  Medication Sig Dispense Refill  . allopurinol (ZYLOPRIM) 300 MG tablet TAKE 1 TABLET BY MOUTH EVERY DAY 90 tablet 2  . colchicine 0.6 MG tablet Take 1 tablet (0.6 mg total) by  mouth 2 (two) times daily. 40 tablet 0  . furosemide (LASIX) 40 MG tablet TAKE 1 TABLET BY MOUTH DAILY 90 tablet 0  . gabapentin (NEURONTIN) 100 MG capsule Take 200 mg by mouth 2 (two) times daily.    [name] levothyroxine (SYNTHROID, LEVOTHROID) 137 MCG tablet TAKE ONE TABLET BY MOUTH ONCE DAILY BEFORE BREAKFAST 90 tablet 0  . mupirocin ointment (BACTROBAN) 2 % Place 1 application into the nose 2 (two) times daily. 22 g 0  . pantoprazole (PROTONIX) 40 MG tablet Take 1 tablet (40 mg total) by mouth 2 (two) times daily for 30 days. 60 tablet 1  . potassium chloride (K-DUR,KLOR-CON) 20 MEQ tablet Take 1 tablet (20 mEq total) by mouth 2 (two) times daily. 180 tablet 1  . sertraline (ZOLOFT) 50 MG tablet Take 1 tablet (50 mg total) by mouth daily. 90 tablet 0  . sucralfate (CARAFATE) 1 GM/10ML suspension Take 10 mLs (1 g total) by mouth 4 (four) times daily for 30 days. 1200 mL 1  . SYRINGE-NEEDLE, DISP, 3 ML (B-D INTEGRA SYRINGE) 23G X 1" 3 ML MISC USE TO ADMINISTER PARENTERAL B12 SUPPLEMENTATION 6 each PRN  . triamterene-hydrochlorothiazide (MAXZIDE-25) 37.5-25 MG tablet TAKE 1 TABLET BY MOUTH EVERY DAY 90 tablet 0  . acetaminophen (TYLENOL) 650 MG CR tablet Take 1,300 mg by mouth 2 (two) times daily.    [name] azelastine (ASTELIN) 0.1 % nasal spray U 1 TO 2 SPRAYS IEN BID  6  . cyanocobalamin (,VITAMIN B-12,) 1000 MCG/ML injection INJECT 1 ML INTO THE MUSCLE EVERY 30 DAYS (Patient not taking: Reported on  06/04/2018) 1 mL 5  . DYMISTA 137-50 MCG/ACT SUSP SPRAY 1 SPRAY INTO EACH NOSTRIL BID  2   No current facility-administered medications for this visit.    Facility-Administered Medications Ordered in Other Visits  Medication Dose Route Frequency Provider Last Rate Last Dose  . 0.9 %  sodium chloride infusion   Intravenous Continuous Lequita Asal, MD 0 mL/hr at 05/08/17 1437    . cyanocobalamin ((VITAMIN B-12)) injection 1,000 mcg  1,000 mcg Intramuscular Once Lequita Asal, MD          ALLERGIES:   Contrast media [iodinated diagnostic agents]; Etodolac; Influenza vaccines; Lactose; Lactose intolerance (gi); Bacitracin-neomycin-polymyxin; Neomycin-bacitracin zn-polymyx; and Neosporin + pain relief max st [neomy-bacit-polymyx-pramoxine]   SOCIAL HISTORY:  The patient  reports that she has never smoked. She has never used smokeless tobacco. She reports current alcohol use. She reports that she does not use drugs.   FAMILY HISTORY:   family history includes Arthritis in her father, maternal grandfather, maternal grandmother, and mother; Breast cancer in her maternal grandmother and paternal grandmother; Cancer in her maternal grandmother; Diabetes in her father and mother; Heart disease in her maternal grandfather; Hyperlipidemia in her father, maternal grandfather, maternal grandmother, and mother; Hypertension in her father, maternal grandfather, maternal grandmother, and mother; Mental illness in her father.    REVIEW OF SYSTEMS: Review of Systems  Constitutional: Negative.   Eyes: Negative.   Respiratory: Negative.  Negative for shortness of breath.   Cardiovascular: Positive for leg swelling.  Gastrointestinal: Negative.   Genitourinary: Negative.   Musculoskeletal: Negative.   Neurological: Negative.   Psychiatric/Behavioral: Negative.   All other systems reviewed and are negative.    PHYSICAL EXAM: VS:  BP 126/76 (BP Location: Right Arm, Patient Position: Sitting, Cuff Size: Normal)   Pulse 76   Ht 5' 7.5" (1.715 m)   Wt 229 lb (103.9 kg)   LMP  (LMP Unknown)   BMI 35.34 kg/m  , BMI Body mass index is 35.34 kg/m.  GEN: Well nourished, well developed, in no acute distress HEENT: normal Neck: no JVD, carotid bruits, or masses Cardiac: RRR; no murmurs, rubs, or gallops, trace nonpitting edema bilateral lower extremities  Respiratory:  clear to auscultation bilaterally, normal work of breathing  GI: soft, nontender, nondistended, + BS MS: no deformity  or atrophy Skin: warm and dry, no rash Neuro:  Strength and sensation are intact Psych: euthymic mood, full affect   RECENT LABS: 05/22/2018: Hemoglobin 14.5; Platelets 364 05/26/2018: ALT 23; BUN 19; Creatinine, Ser 0.82; Potassium 2.9; Sodium 136; TSH 0.57    LIPID PANEL: Lab Results  Component Value Date   CHOL 178 05/26/2018   HDL 62.60 05/26/2018   LDLCALC 82 04/10/2017   TRIG 256.0 (H) 05/26/2018      WEIGHT: Wt Readings from Last 3 Encounters:  06/04/18 229 lb (103.9 kg)  05/26/18 223 lb 14.4 oz (101.6 kg)  05/25/18 225 lb 8.5 oz (102.3 kg)       ASSESSMENT AND PLAN:  Shortness of breath Plan: Recommend scheduling an echocardiogram  Bilateral leg edema Plan: Recommend scheduling an echocardiogram She is on high-dose diuretics Lasix 40 daily with HCTZ 25 daily now on potassium 20 twice daily Lab work concerning for mild prerenal state She is looking to wean down on the diuretics Suggested she decrease Lasix down to 20 daily and stay on HCTZ If no significant change in her leg swelling potentially could take Lasix every other day with HCTZ daily -She does have  high water intake, unable to exclude component of diastolic dysfunction, possibly exacerbated by weight  Hyperlipidemia Numbers discussed in detail, recommended dietary changes for elevated triglycerides We did discuss screening studies such as CT coronary calcium scoring  Essential hypertension Blood pressure relatively well controlled, no changes to her medications  GERD Symptoms well controlled On PPI  Disposition:   F/U  PRN We will call her with her echocardiogram results  Total encounter time more than 60 minutes. Greater than 50% was spent in counseling and coordination of care with the patient.  Patient was seen in consultation for Dr. Nicki Reaper and will be referred back to her office for ongoing care of the issues detailed above   Orders Placed This Encounter  Procedures  . EKG 12-Lead   . ECHOCARDIOGRAM COMPLETE   I, Jesus Reyes am acting as a scribe for Ida Rogue, M.D., Ph.D.  I, Ida Rogue, M.D. Ph.D., have reviewed the above documentation for accuracy and completeness, and I agree with the above.   Signed, Esmond Plants, M.D., Ph.D. 06/04/2018  [place], [place]

## 2018-06-03 ENCOUNTER — Ambulatory Visit
Admission: RE | Admit: 2018-06-03 | Discharge: 2018-06-03 | Disposition: A | Discharge status: Home / Self Care | Payer: Managed Care, Other (non HMO) | Source: Ambulatory Visit | Attending: Internal Medicine | Admitting: Internal Medicine

## 2018-06-03 ENCOUNTER — Ambulatory Visit: Payer: Self-pay

## 2018-06-03 DIAGNOSIS — Z1239 Encounter for other screening for malignant neoplasm of breast: Secondary | ICD-10-CM | POA: Diagnosis present

## 2018-06-03 DIAGNOSIS — Z1231 Encounter for screening mammogram for malignant neoplasm of breast: Secondary | ICD-10-CM | POA: Insufficient documentation

## 2018-06-04 ENCOUNTER — Ambulatory Visit (INDEPENDENT_AMBULATORY_CARE_PROVIDER_SITE_OTHER): Payer: Managed Care, Other (non HMO) | Admitting: Cardiovascular Disease

## 2018-06-04 ENCOUNTER — Encounter: Payer: Self-pay | Admitting: Internal Medicine

## 2018-06-04 ENCOUNTER — Ambulatory Visit: Payer: Managed Care, Other (non HMO) | Admitting: Occupational Therapy

## 2018-06-04 ENCOUNTER — Encounter: Payer: Self-pay | Admitting: Cardiovascular Disease

## 2018-06-04 VITALS — BP 126/76 | HR 76 | Ht 67.5 in | Wt 229.0 lb

## 2018-06-04 DIAGNOSIS — E7800 Pure hypercholesterolemia, unspecified: Secondary | ICD-10-CM

## 2018-06-04 DIAGNOSIS — I872 Venous insufficiency (chronic) (peripheral): Secondary | ICD-10-CM | POA: Diagnosis not present

## 2018-06-04 DIAGNOSIS — R0602 Shortness of breath: Secondary | ICD-10-CM | POA: Diagnosis not present

## 2018-06-04 DIAGNOSIS — I10 Essential (primary) hypertension: Secondary | ICD-10-CM | POA: Diagnosis not present

## 2018-06-04 DIAGNOSIS — M7989 Other specified soft tissue disorders: Secondary | ICD-10-CM

## 2018-06-04 NOTE — Patient Instructions (Addendum)
Medication Instructions:  No changes  If you need a refill on your cardiac medications before your next appointment, please call your pharmacy.   Lab work: No new labs needed   If you have labs (blood work) drawn today and your tests are completely normal, you will receive your results only by: [name] MyChart Message (if you have MyChart) OR . A paper copy in the mail If you have any lab test that is abnormal or we need to change your treatment, we will call you to review the results.   Testing/Procedures: We will order an echocardiogarm for leg swelling, SOB Your physician has requested that you have an echocardiogram. Echocardiography is a painless test that uses sound waves to create images of your heart. It provides your doctor with information about the size and shape of your heart and how well your heart's chambers and valves are working. This procedure takes approximately one hour. There are no restrictions for this procedure.    Follow-Up: At [facility], you and your health needs are our priority.  As part of our continuing mission to provide you with exceptional heart care, we have created designated Provider Care Teams.  These Care Teams include your primary Cardiologist (physician) and Advanced Practice Providers (APPs -  Physician Assistants and Nurse Practitioners) who all work together to provide you with the care you need, when you need it.  . You will need a follow up appointment as needed  . Providers on your designated Care Team:   . Murray Hodgkins, NP . Christell Faith, PA-C . Marrianne Mood, PA-C  Any Other Special Instructions Will Be Listed Below (If Applicable).  For educational health videos Log in to : [email or web address] Or : [email or web address], password : [email or web address]   Echocardiogram An echocardiogram is a procedure that uses painless sound waves (ultrasound) to produce an image of the heart. Images from an echocardiogram can provide important information  about:  Signs of coronary artery disease (CAD).  Aneurysm detection. An aneurysm is a weak or damaged part of an artery wall that bulges out from the normal force of blood pumping through the body.  Heart size and shape. Changes in the size or shape of the heart can be associated with certain conditions, including heart failure, aneurysm, and CAD.  Heart muscle function.  Heart valve function.  Signs of a past heart attack.  Fluid buildup around the heart.  Thickening of the heart muscle.  A tumor or infectious growth around the heart valves. Tell a health care provider about:  Any allergies you have.  All medicines you are taking, including vitamins, herbs, eye drops, creams, and over-the-counter medicines.  Any blood disorders you have.  Any surgeries you have had.  Any medical conditions you have.  Whether you are pregnant or may be pregnant. What are the risks? Generally, this is a safe procedure. However, problems may occur, including:  Allergic reaction to dye (contrast) that may be used during the procedure. What happens before the procedure? No specific preparation is needed. You may eat and drink normally. What happens during the procedure?   An IV tube may be inserted into one of your veins.  You may receive contrast through this tube. A contrast is an injection that improves the quality of the pictures from your heart.  A gel will be applied to your chest.  A wand-like tool (transducer) will be moved over your chest. The gel will help to transmit the sound waves from the transducer.  The  sound waves will harmlessly bounce off of your heart to allow the heart images to be captured in real-time motion. The images will be recorded on a computer. The procedure may vary among health care providers and hospitals. What happens after the procedure?  You may return to your normal, everyday life, including diet, activities, and medicines, unless your health care  provider tells you not to do that. Summary  An echocardiogram is a procedure that uses painless sound waves (ultrasound) to produce an image of the heart.  Images from an echocardiogram can provide important information about the size and shape of your heart, heart muscle function, heart valve function, and fluid buildup around your heart.  You do not need to do anything to prepare before this procedure. You may eat and drink normally.  After the echocardiogram is completed, you may return to your normal, everyday life, unless your health care provider tells you not to do that. This information is not intended to replace advice given to you by your health care provider. Make sure you discuss any questions you have with your health care provider. Document Released: 03/15/2000 Document Revised: 04/20/2016 Document Reviewed: 04/20/2016 Elsevier Interactive Patient Education  2019 [employer].

## 2018-06-11 ENCOUNTER — Encounter (INDEPENDENT_AMBULATORY_CARE_PROVIDER_SITE_OTHER): Payer: Managed Care, Other (non HMO)

## 2018-06-11 ENCOUNTER — Other Ambulatory Visit: Payer: Self-pay

## 2018-06-16 ENCOUNTER — Ambulatory Visit: Payer: Managed Care, Other (non HMO) | Admitting: Occupational Therapy

## 2018-06-17 ENCOUNTER — Encounter (INDEPENDENT_AMBULATORY_CARE_PROVIDER_SITE_OTHER): Payer: Self-pay

## 2018-06-17 ENCOUNTER — Ambulatory Visit (INDEPENDENT_AMBULATORY_CARE_PROVIDER_SITE_OTHER): Payer: Managed Care, Other (non HMO) | Admitting: Family Medicine

## 2018-06-17 ENCOUNTER — Encounter (INDEPENDENT_AMBULATORY_CARE_PROVIDER_SITE_OTHER): Payer: Self-pay | Admitting: Family Medicine

## 2018-06-17 ENCOUNTER — Other Ambulatory Visit: Payer: Self-pay

## 2018-06-17 VITALS — BP 117/69 | HR 71 | Temp 98.0°F | Ht 67.0 in | Wt 222.0 lb

## 2018-06-17 DIAGNOSIS — R5383 Other fatigue: Secondary | ICD-10-CM | POA: Diagnosis not present

## 2018-06-17 DIAGNOSIS — E669 Obesity, unspecified: Secondary | ICD-10-CM

## 2018-06-17 DIAGNOSIS — Z1331 Encounter for screening for depression: Secondary | ICD-10-CM | POA: Diagnosis not present

## 2018-06-17 DIAGNOSIS — R0602 Shortness of breath: Secondary | ICD-10-CM

## 2018-06-17 DIAGNOSIS — Z0289 Encounter for other administrative examinations: Secondary | ICD-10-CM

## 2018-06-17 DIAGNOSIS — R7303 Prediabetes: Secondary | ICD-10-CM

## 2018-06-17 DIAGNOSIS — Z9189 Other specified personal risk factors, not elsewhere classified: Secondary | ICD-10-CM

## 2018-06-17 DIAGNOSIS — Z6834 Body mass index (BMI) 34.0-34.9, adult: Secondary | ICD-10-CM
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
## 2018-06-18 LAB — VITAMIN B12: Vitamin B-12: 598 pg/mL (ref 232–1245)

## 2018-06-18 LAB — INSULIN, RANDOM: INSULIN: 12.2 u[IU]/mL (ref 2.6–24.9)

## 2018-06-18 LAB — T3: T3, Total: 120 ng/dL (ref 71–180)

## 2018-06-18 LAB — VITAMIN D 25 HYDROXY (VIT D DEFICIENCY, FRACTURES): Vit D, 25-Hydroxy: 18.4 ng/mL — ABNORMAL LOW (ref 30.0–100.0)

## 2018-06-18 LAB — FOLATE: Folate: >20 ng/mL (ref >3.0)

## 2018-06-18 LAB — T4, FREE: Free T4: 1.12 ng/dL (ref 0.82–1.77)

## 2018-06-19 ENCOUNTER — Ambulatory Visit (INDEPENDENT_AMBULATORY_CARE_PROVIDER_SITE_OTHER): Payer: Managed Care, Other (non HMO) | Admitting: Vascular Surgery

## 2018-06-23 ENCOUNTER — Encounter (INDEPENDENT_AMBULATORY_CARE_PROVIDER_SITE_OTHER): Payer: Self-pay

## 2018-06-23 ENCOUNTER — Ambulatory Visit: Payer: Managed Care, Other (non HMO) | Admitting: Occupational Therapy

## 2018-07-01 ENCOUNTER — Other Ambulatory Visit: Payer: Self-pay

## 2018-07-02 ENCOUNTER — Other Ambulatory Visit: Payer: Self-pay

## 2018-07-02 ENCOUNTER — Encounter (INDEPENDENT_AMBULATORY_CARE_PROVIDER_SITE_OTHER): Payer: Self-pay

## 2018-07-02 ENCOUNTER — Other Ambulatory Visit (INDEPENDENT_AMBULATORY_CARE_PROVIDER_SITE_OTHER): Payer: Self-pay | Admitting: Family Medicine

## 2018-07-02 ENCOUNTER — Ambulatory Visit (INDEPENDENT_AMBULATORY_CARE_PROVIDER_SITE_OTHER): Payer: Managed Care, Other (non HMO) | Admitting: Family Medicine

## 2018-07-02 ENCOUNTER — Encounter (INDEPENDENT_AMBULATORY_CARE_PROVIDER_SITE_OTHER): Payer: Self-pay | Admitting: Family Medicine

## 2018-07-02 ENCOUNTER — Ambulatory Visit (INDEPENDENT_AMBULATORY_CARE_PROVIDER_SITE_OTHER): Payer: Managed Care, Other (non HMO)

## 2018-07-02 DIAGNOSIS — R0602 Shortness of breath: Secondary | ICD-10-CM | POA: Diagnosis not present

## 2018-07-02 DIAGNOSIS — E559 Vitamin D deficiency, unspecified: Secondary | ICD-10-CM

## 2018-07-02 DIAGNOSIS — R7303 Prediabetes: Secondary | ICD-10-CM

## 2018-07-02 DIAGNOSIS — E669 Obesity, unspecified: Secondary | ICD-10-CM | POA: Diagnosis not present

## 2018-07-02 DIAGNOSIS — Z6834 Body mass index (BMI) 34.0-34.9, adult: Secondary | ICD-10-CM

## 2018-07-02 MED ORDER — VITAMIN D (ERGOCALCIFEROL) 1.25 MG (50000 UNIT) PO CAPS: 50000.0000 [IU] | ORAL_CAPSULE | ORAL | 0 refills | Status: DC

## 2018-07-02 MED ORDER — METFORMIN HCL 500 MG PO TABS: 500.0000 mg | ORAL_TABLET | Freq: Every day | ORAL | 0 refills | Status: DC

## 2018-07-06 NOTE — Progress Notes (Signed)
Office: [phone]  /  Fax: [phone] TeleHealth Visit:  Lori Le has verbally consented to this TeleHealth visit today. The patient is located at home, the provider is located at the [employer] [facility] office. The participants in this visit include the listed provider and patient and provider's assistant. The visit was conducted today via webex.  HPI:   Chief Complaint: OBESITY Lori Le is here to discuss her progress with her obesity treatment plan. She is on the Category 2 plan and is following her eating plan approximately 50 % of the time. She states she is walking for 20-30 minutes 7 times per week. Lori Le found it difficult to find some of the food in the grocery store. She is working at home, and finds herself not eating so much. She hasn't been eating bread, and she is skipping breakfast occasionally. She is craving crackers and is looking for variety in food options.  We were unable to weigh the patient today for this TeleHealth visit. She feels as if she has lost weight since her last visit. Unable to determine weight loss since starting treatment with [name].  Vitamin D Deficiency Lori Le has a diagnosis of vitamin D deficiency. She is not currently taking Vit D supplementation. She notes fatigue and denies nausea, vomiting or muscle weakness.  Pre-Diabetes Lori Le has a diagnosis of pre-diabetes based on her elevated Hgb A1c at 5.8 and insulin at 12.2, and was informed this puts her at greater risk of developing diabetes. She has been pre-diabetic for years and was previously on metformin. She continues to work on diet and exercise to decrease risk of diabetes. She denies nausea or hypoglycemia.  ASSESSMENT AND PLAN:  Vitamin D deficiency - Plan: Vitamin D, Ergocalciferol, (DRISDOL) 1.25 MG (50000 UT) CAPS capsule  Prediabetes - Plan: metFORMIN (GLUCOPHAGE) 500 MG tablet  Class 1 obesity with serious comorbidity and body mass index (BMI) of 34.0 to 34.9 in adult, unspecified  obesity type  PLAN:  Vitamin D Deficiency Lori Le was informed that low vitamin D levels contributes to fatigue and are associated with obesity, breast, and colon cancer. Lori Le agrees to start prescription Vit D @50 ,000 IU every week #4 with no refills. She will follow up for routine testing of vitamin D, at least 2-3 times per year. She was informed of the risk of over-replacement of vitamin D and agrees to not increase her dose unless she discusses this with [name] first. Lori Le agrees to follow up with our clinic in 2 weeks.  Pre-Diabetes Lori Le will continue to work on weight loss, exercise, and decreasing simple carbohydrates in her diet to help decrease the risk of diabetes. We dicussed metformin including benefits and risks. She was informed that eating too many simple carbohydrates or too many calories at one sitting increases the likelihood of GI side effects. Lori Le agrees to start metformin 500 mg PO daily #30 with no refills. Lori Le agrees to follow up with our clinic in 2 weeks as directed to monitor her progress.  Obesity Lori Le is currently in the action stage of change. As such, her goal is to continue with weight loss efforts She has agreed to keep a food journal with 1200-1300 calories and 80+ grams of protein daily or follow the Category 2 plan Lori Le has been instructed to work up to a goal of 150 minutes of combined cardio and strengthening exercise per week for weight loss and overall health benefits. We discussed the following Behavioral Modification Strategies today: increasing lean protein intake, increasing  vegetables and work on meal planning and easy cooking plans, no skipping meals, and planning for success We have sent Pre-diabetes/Insulin Resistance, and additional lunch options via MyChart.  Lori Le has agreed to follow up with our clinic in 2 weeks. She was informed of the importance of frequent follow up visits to maximize her success with intensive lifestyle modifications for her  multiple health conditions.  ALLERGIES: Allergies  Allergen Reactions  . Contrast Media [Iodinated Diagnostic Agents] Anaphylaxis    MRI dye  . Etodolac Anaphylaxis  . Influenza Vaccines Hives  . Lactose Nausea And Vomiting    Can have yogurt Can not have milk and ice cream   . Lactose Intolerance (Gi) Nausea And Vomiting    Can have yogurt Can not have milk and ice cream    . Bacitracin-Neomycin-Polymyxin Rash  . Neomycin-Bacitracin Zn-Polymyx Rash  . Neosporin + Pain Relief Max St [Neomy-Bacit-Polymyx-Pramoxine] Rash    MEDICATIONS: Current Outpatient Medications on File Prior to Visit  Medication Sig Dispense Refill  . acetaminophen (TYLENOL) 650 MG CR tablet Take 1,300 mg by mouth 2 (two) times daily.    [name] allopurinol (ZYLOPRIM) 300 MG tablet TAKE 1 TABLET BY MOUTH EVERY DAY 90 tablet 2  . furosemide (LASIX) 40 MG tablet TAKE 1 TABLET BY MOUTH DAILY 90 tablet 0  . gabapentin (NEURONTIN) 100 MG capsule Take 200 mg by mouth 2 (two) times daily.    . metolazone (ZAROXOLYN) 5 MG tablet Take 5 mg by mouth daily.    . montelukast (SINGULAIR) 10 MG tablet Take 10 mg by mouth at bedtime.    . pantoprazole (PROTONIX) 40 MG tablet Take 1 tablet (40 mg total) by mouth 2 (two) times daily for 30 days. 60 tablet 1  . potassium chloride SA (K-DUR,KLOR-CON) 20 MEQ tablet Take 20 mEq by mouth 2 (two) times daily.    . sertraline (ZOLOFT) 50 MG tablet Take 1 tablet (50 mg total) by mouth daily. 90 tablet 0  . triamterene-hydrochlorothiazide (MAXZIDE-25) 37.5-25 MG tablet TAKE 1 TABLET BY MOUTH EVERY DAY 90 tablet 0   Current Facility-Administered Medications on File Prior to Visit  Medication Dose Route Frequency Provider Last Rate Last Dose  . 0.9 %  sodium chloride infusion   Intravenous Continuous Lequita Asal, MD 0 mL/hr at 05/08/17 1437    . cyanocobalamin ((VITAMIN B-12)) injection 1,000 mcg  1,000 mcg Intramuscular Once Lequita Asal, MD        PAST MEDICAL HISTORY:  Past Medical History:  Diagnosis Date  . Allergic rhinitis due to allergen   . Allergy   . Anemia   . Anxiety   . Arthritis   . B12 deficiency   . Cancer ([place])    skin ca  . Chronic idiopathic urticaria   . Complication of anesthesia    nausea, slow to wake up  . Depression   . Edema   . Gastric ulcer   . GERD (gastroesophageal reflux disease)   . Gout   . Gout   . Hashimoto's thyroiditis   . Heart murmur   . History of eating disorder   . Hives   . Hyperlipidemia   . Hypertension   . Hypothyroidism   . Lower extremity edema   . Melanoma in situ ([place])    left shoulder  . Migraine   . Mitral valve disorder   . Motion sickness    all moving vehicles  . Neuropathy   . PCOS (polycystic ovarian syndrome)   .  PONV (postoperative nausea and vomiting)   . Thyroid disease   . Ulcer   . Vertigo    last episode over 1 yr ago    PAST SURGICAL HISTORY: Past Surgical History:  Procedure Laterality Date  . BREAST BIOPSY Right 2007   neg- bx/clip  . CHOLECYSTECTOMY  2003  . COLONOSCOPY WITH PROPOFOL N/A 05/11/2018   Procedure: COLONOSCOPY WITH PROPOFOL;  Surgeon: [name], MD;  Location: [facility] ENDOSCOPY;  Service: Endoscopy;  Laterality: N/A;  . DILITATION & CURRETTAGE/HYSTROSCOPY WITH NOVASURE ABLATION N/A 08/19/2016   Procedure: DILATATION & CURETTAGE/HYSTEROSCOPY WITH NOVASURE ABLATION;  Surgeon: [name], MD;  Location: [facility] ORS;  Service: Gynecology;  Laterality: N/A;  . ESOPHAGOGASTRODUODENOSCOPY (EGD) WITH PROPOFOL N/A 12/05/2015   Procedure: ESOPHAGOGASTRODUODENOSCOPY (EGD) WITH PROPOFOL;  Surgeon: Lollie Sails, MD;  Location[facility] ENDOSCOPY;  Service: Endoscopy;  Laterality: N/A;  . ESOPHAGOGASTRODUODENOSCOPY (EGD) WITH PROPOFOL N/A 02/06/2016   Procedure: ESOPHAGOGASTRODUODENOSCOPY (EGD) WITH PROPOFOL;  Surgeon: Lollie Sails, MD;  Location: [facility] ENDOSCOPY;  Service: Endoscopy;  Laterality: N/A;  . ESOPHAGOGASTRODUODENOSCOPY (EGD) WITH PROPOFOL  N/A 05/11/2018   Procedure: ESOPHAGOGASTRODUODENOSCOPY (EGD) WITH PROPOFOL;  Surgeon: [name], MD;  Location: [facility] ENDOSCOPY;  Service: Endoscopy;  Laterality: N/A;  . FOOT SURGERY  1994  . GASTRIC BYPASS  2011  . JOINT REPLACEMENT Left 09/27/2014   HIP  . JOINT REPLACEMENT Left 12/01/2014   KNEE  . KNEE ARTHROSCOPY Right   . KNEE ARTHROSCOPY Left 03/23/2015   Procedure: ARTHROSCOPY KNEE, partial synovectomy;  Surgeon: Hessie Knows, MD;  Location: [facility] ORS;  Service: Orthopedics;  Laterality: Left;  . LAPAROSCOPIC GASTRIC BANDING  2010  . ROTATOR CUFF REPAIR Bilateral    right shoulder 07-26-2016  . SHOULDER ARTHROSCOPY Left 06/20/2015   Procedure: ARTHROSCOPY SHOULDER, REPAIR OF MASSIVE ROTATOR CUFF TEAR, TENODESIS, DECOMPRESSION DEBRIDEMENT;  Surgeon: Corky Mull, MD;  Location: [facility] ORS;  Service: Orthopedics;  Laterality: Left;  . TOTAL HIP ARTHROPLASTY Left 09/27/2014   Procedure: TOTAL HIP ARTHROPLASTY ANTERIOR APPROACH;  Surgeon: Hessie Knows, MD;  Location: [facility] ORS;  Service: Orthopedics;  Laterality: Left;  . TOTAL KNEE ARTHROPLASTY Left 12/01/2014   Procedure: TOTAL KNEE ARTHROPLASTY;  Surgeon: Hessie Knows, MD;  Location: [facility] ORS;  Service: Orthopedics;  Laterality: Left;    SOCIAL HISTORY: Social History   Tobacco Use  . Smoking status: Never Smoker  . Smokeless tobacco: Never Used  Substance Use Topics  . Alcohol use: Yes    Alcohol/week: 0.0 - 1.0 standard drinks    Comment: occ.  . Drug use: No    FAMILY HISTORY: Family History  Problem Relation Age of Onset  . Arthritis Mother   . Hyperlipidemia Mother   . Hypertension Mother   . Diabetes Mother   . Sleep apnea Mother   . Obesity Mother   . Arthritis Father   . Hyperlipidemia Father   . Hypertension Father   . Mental illness Father   . Diabetes Father   . Obesity Father   . Sleep apnea Father   . Bipolar disorder Father   . Anxiety disorder Father   . Depression Father   . Cancer Father    . Kidney disease Father   . Arthritis Maternal Grandmother   . Cancer Maternal Grandmother        breast cancer  . Hyperlipidemia Maternal Grandmother   . Hypertension Maternal Grandmother   . Breast cancer Maternal Grandmother   . Arthritis Maternal Grandfather   . Hyperlipidemia Maternal Grandfather   .  Hypertension Maternal Grandfather   . Heart disease Maternal Grandfather        heart attack  . Breast cancer Paternal Grandmother     ROS: Review of Systems  Constitutional: Positive for malaise/fatigue and weight loss.  Gastrointestinal: Negative for nausea and vomiting.  Musculoskeletal:       Negative muscle weakness  Endo/Heme/Allergies:       Negative hypoglycemia    PHYSICAL EXAM: Pt in no acute distress  RECENT LABS AND TESTS: BMET    Component Value Date/Time   NA 136 05/26/2018 0842   NA 138 01/26/2018   K 2.9 (L) 05/26/2018 0842   K 4.0 09/27/2014   CL 88 (L) 05/26/2018 0842   CO2 36 (H) 05/26/2018 0842   GLUCOSE 101 (H) 05/26/2018 0842   BUN 19 05/26/2018 0842   BUN 22 (A) 01/26/2018   CREATININE 0.82 05/26/2018 0842   CREATININE 0.81 09/12/2017 1529   CALCIUM 9.5 05/26/2018 0842   GFRNONAA 103 04/10/2017 0821   GFRAA 119 04/10/2017 0821   Lab Results  Component Value Date   HGBA1C 5.8 05/26/2018   HGBA1C 5.5 04/10/2017   HGBA1C 5.4 04/07/2015   Lab Results  Component Value Date   INSULIN 12.2 06/17/2018   CBC    Component Value Date/Time   WBC 8.0 05/22/2018 1540   RBC 4.76 05/22/2018 1540   HGB 14.5 05/22/2018 1540   HGB 13.1 04/07/2015 0938   HCT 42.2 05/22/2018 1540   HCT 40.4 04/07/2015 0938   PLT 364 05/22/2018 1540   PLT 300 04/07/2015 0938   MCV 88.7 05/22/2018 1540   MCV 85 04/07/2015 0938   MCH 30.5 05/22/2018 1540   MCHC 34.4 05/22/2018 1540   RDW 13.4 05/22/2018 1540   RDW 16.9 (H) 04/07/2015 0938   LYMPHSABS 0.9 05/22/2018 1540   LYMPHSABS 1.8 04/07/2015 0938   MONOABS 0.2 05/22/2018 1540   EOSABS 0.0 05/22/2018  1540   EOSABS 0.2 04/07/2015 0938   BASOSABS 0.0 05/22/2018 1540   BASOSABS 0.1 04/07/2015 0938   Iron/TIBC/Ferritin/ %Sat    Component Value Date/Time   IRON 31 05/22/2018 1540   TIBC 373 05/22/2018 1540   FERRITIN 33 05/22/2018 1540   FERRITIN 42 04/07/2015 0938   IRONPCTSAT 8 (L) 05/22/2018 1540   Lipid Panel     Component Value Date/Time   CHOL 178 05/26/2018 0842   CHOL 202 (H) 04/10/2017 0821   TRIG 256.0 (H) 05/26/2018 0842   HDL 62.60 05/26/2018 0842   HDL 67 04/10/2017 0821   CHOLHDL 3 05/26/2018 0842   VLDL 51.2 (H) 05/26/2018 0842   LDLCALC 82 04/10/2017 0821   LDLDIRECT 106.0 05/26/2018 0842   Hepatic Function Panel     Component Value Date/Time   PROT 6.7 05/26/2018 0842   PROT 6.7 04/10/2017 0821   ALBUMIN 4.4 05/26/2018 0842   ALBUMIN 4.2 04/10/2017 0821   AST 11 05/26/2018 0842   ALT 23 05/26/2018 0842   ALKPHOS 50 05/26/2018 0842   BILITOT 0.5 05/26/2018 0842   BILITOT 0.3 04/10/2017 0821   BILIDIR 0.1 09/12/2017 1529   BILIDIR 0.09 04/10/2017 0821   IBILI 0.2 09/12/2017 1529      Component Value Date/Time   TSH 0.57 05/26/2018 0842   TSH 0.54 12/26/2016 0805   TSH 1.40 02/28/2015 1406      I, Lori Le, am acting as transcriptionist for Ilene Qua, MD  I have reviewed the above documentation for accuracy and completeness, and I agree with  the above. - Ilene Qua, MD

## 2018-07-08 ENCOUNTER — Other Ambulatory Visit: Payer: Self-pay | Admitting: Internal Medicine

## 2018-07-08 NOTE — Telephone Encounter (Signed)
I can refill her lasix, but her potassium was low on last check.  She was supposed to get this rechecked.  I do not see any recent results (for potassium).  Did she have drawn?  If no, need to schedule and then ok to refill x 1.  Would still try intermittently to reduce the dose.

## 2018-07-08 NOTE — Telephone Encounter (Signed)
Sent to PCP for approval.  

## 2018-07-09 ENCOUNTER — Other Ambulatory Visit: Payer: Self-pay | Admitting: Internal Medicine

## 2018-07-09 ENCOUNTER — Other Ambulatory Visit: Payer: Self-pay

## 2018-07-09 ENCOUNTER — Other Ambulatory Visit (INDEPENDENT_AMBULATORY_CARE_PROVIDER_SITE_OTHER): Payer: Managed Care, Other (non HMO)

## 2018-07-09 ENCOUNTER — Ambulatory Visit: Admit: 2018-07-09 | Payer: Managed Care, Other (non HMO) | Admitting: Podiatry

## 2018-07-09 DIAGNOSIS — E876 Hypokalemia: Secondary | ICD-10-CM

## 2018-07-09 DIAGNOSIS — D72829 Elevated white blood cell count, unspecified: Secondary | ICD-10-CM

## 2018-07-09 DIAGNOSIS — Z760 Encounter for issue of repeat prescription: Secondary | ICD-10-CM

## 2018-07-09 SURGERY — FUSION, JOINT, FOOT
Anesthesia: Choice | Laterality: Left

## 2018-07-09 NOTE — Addendum Note (Signed)
Addended by: Leeanne Rio on: 07/09/2018 02:59 PM   Modules accepted: Orders

## 2018-07-09 NOTE — Telephone Encounter (Signed)
Pt scheduled for potassium check today

## 2018-07-10 LAB — BASIC METABOLIC PANEL
BUN/Creatinine Ratio: 33 — ABNORMAL HIGH (ref 9–23)
BUN: 26 mg/dL — ABNORMAL HIGH (ref 6–24)
CO2: 26 mmol/L (ref 20–29)
Calcium: 10.2 mg/dL (ref 8.7–10.2)
Chloride: 86 mmol/L — ABNORMAL LOW (ref 96–106)
Creatinine, Ser: 0.8 mg/dL (ref 0.57–1.00)
GFR calc Af Amer: 98 mL/min/{1.73_m2} (ref >59)
GFR calc non Af Amer: 85 mL/min/{1.73_m2} (ref >59)
Glucose: 180 mg/dL — ABNORMAL HIGH (ref 65–99)
Potassium: 3 mmol/L — ABNORMAL LOW (ref 3.5–5.2)
Sodium: 135 mmol/L (ref 134–144)

## 2018-07-10 LAB — CBC WITH DIFFERENTIAL/PLATELET
Basophils Absolute: 0.1 10*3/uL (ref 0.0–0.2)
Basos: 1 %
EOS (ABSOLUTE): 0.1 10*3/uL (ref 0.0–0.4)
Eos: 1 %
Hematocrit: 40.4 % (ref 34.0–46.6)
Hemoglobin: 14.2 g/dL (ref 11.1–15.9)
Immature Grans (Abs): 0 10*3/uL (ref 0.0–0.1)
Immature Granulocytes: 0 %
Lymphocytes Absolute: 2.6 10*3/uL (ref 0.7–3.1)
Lymphs: 24 %
MCH: 30.9 pg (ref 26.6–33.0)
MCHC: 35.1 g/dL (ref 31.5–35.7)
MCV: 88 fL (ref 79–97)
Monocytes Absolute: 0.6 10*3/uL (ref 0.1–0.9)
Monocytes: 6 %
Neutrophils Absolute: 7.2 10*3/uL — ABNORMAL HIGH (ref 1.4–7.0)
Neutrophils: 68 %
Platelets: 316 10*3/uL (ref 150–450)
RBC: 4.59 x10E6/uL (ref 3.77–5.28)
RDW: 13 % (ref 11.7–15.4)
WBC: 10.5 10*3/uL (ref 3.4–10.8)

## 2018-07-11 MED ORDER — FUROSEMIDE 40 MG PO TABS: 40.0000 mg | ORAL_TABLET | Freq: Every day | ORAL | 0 refills | Status: DC

## 2018-07-13 ENCOUNTER — Telehealth: Payer: Self-pay

## 2018-07-13 ENCOUNTER — Other Ambulatory Visit: Payer: Self-pay

## 2018-07-13 ENCOUNTER — Other Ambulatory Visit: Payer: Self-pay | Admitting: Internal Medicine

## 2018-07-13 DIAGNOSIS — R600 Localized edema: Secondary | ICD-10-CM

## 2018-07-13 DIAGNOSIS — E878 Other disorders of electrolyte and fluid balance, not elsewhere classified: Secondary | ICD-10-CM

## 2018-07-13 DIAGNOSIS — E876 Hypokalemia: Secondary | ICD-10-CM

## 2018-07-13 MED ORDER — POTASSIUM CHLORIDE CRYS ER 20 MEQ PO TBCR: EXTENDED_RELEASE_TABLET | ORAL | 0 refills | Status: DC

## 2018-07-13 NOTE — Telephone Encounter (Signed)
See result note.  

## 2018-07-13 NOTE — Telephone Encounter (Signed)
Copied from [place] [phone]. Topic: General - Other >> Jul 13, 2018  1:59 PM Burchel, Abbi R wrote: Reason for CRM:   Pt states she is returning a call from [place] re: med changes.  Please call pt: [phone]

## 2018-07-13 NOTE — Progress Notes (Signed)
Order placed for nephrology referral.   °

## 2018-07-16 ENCOUNTER — Encounter (INDEPENDENT_AMBULATORY_CARE_PROVIDER_SITE_OTHER): Payer: Self-pay | Admitting: Family Medicine

## 2018-07-16 ENCOUNTER — Other Ambulatory Visit: Payer: Self-pay

## 2018-07-16 ENCOUNTER — Ambulatory Visit (INDEPENDENT_AMBULATORY_CARE_PROVIDER_SITE_OTHER): Payer: Managed Care, Other (non HMO) | Admitting: Family Medicine

## 2018-07-16 ENCOUNTER — Other Ambulatory Visit (INDEPENDENT_AMBULATORY_CARE_PROVIDER_SITE_OTHER): Payer: Self-pay | Admitting: Family Medicine

## 2018-07-16 DIAGNOSIS — Z6834 Body mass index (BMI) 34.0-34.9, adult: Secondary | ICD-10-CM | POA: Diagnosis not present

## 2018-07-16 DIAGNOSIS — R7303 Prediabetes: Secondary | ICD-10-CM

## 2018-07-16 DIAGNOSIS — E669 Obesity, unspecified: Secondary | ICD-10-CM

## 2018-07-16 DIAGNOSIS — E876 Hypokalemia: Secondary | ICD-10-CM

## 2018-07-16 MED ORDER — METFORMIN HCL 500 MG PO TABS: 500.0000 mg | ORAL_TABLET | Freq: Every day | ORAL | 0 refills | Status: DC

## 2018-07-20 ENCOUNTER — Encounter (INDEPENDENT_AMBULATORY_CARE_PROVIDER_SITE_OTHER): Payer: Self-pay | Admitting: Family Medicine

## 2018-07-20 NOTE — Telephone Encounter (Signed)
Please advise 

## 2018-07-21 ENCOUNTER — Telehealth: Payer: Self-pay | Admitting: *Deleted

## 2018-07-21 DIAGNOSIS — E878 Other disorders of electrolyte and fluid balance, not elsewhere classified: Secondary | ICD-10-CM

## 2018-07-21 DIAGNOSIS — E876 Hypokalemia: Secondary | ICD-10-CM

## 2018-07-21 NOTE — Telephone Encounter (Signed)
I have placed the order for met b.  She came here last time because the draw station was not open.  I did not talk to her when the lab appt was scheduled, but I assume she will come here again.  Thanks    Dr Nicki Reaper

## 2018-07-21 NOTE — Progress Notes (Signed)
Office: [phone]  /  Fax: [phone] TeleHealth Visit:  Lori Le has verbally consented to this TeleHealth visit today. The patient is located at home, the provider is located at the [employer] [facility] office. The participants in this visit include the listed provider and patient. The visit was conducted today via webex.  HPI:   Chief Complaint: OBESITY Lori Le is here to discuss her progress with her obesity treatment plan. She is on the keep a food journal with 1200-1300 calories and 80+ grams of protein daily or follow the Category 2 plan and is following her eating plan approximately 90-95 % of the time. She states she is walking 3 miles 3-4 times per week. Lori Le recently started on prednisone for gout flare, and has been doing a good amount of snacking secondary to prednisone. She has noticed she has been doing higher calorie snacks (like nuts).  We were unable to weigh the patient today for this TeleHealth visit. She feels as if she has gained 2 lbs since her last visit. She has lost 0 lbs since starting treatment with [name].  Hypokalemia Amory's recent K+ was of 3.0 (previously 2.9). She was referred by her primary care physician to Nephrology.  Pre-Diabetes Lori Le has a diagnosis of pre-diabetes based on her elevated Hgb A1c of 5.8 and insulin of 12.2. She was informed this puts her at greater risk of developing diabetes. She denies GI side effects of metformin and continues to work on diet and exercise to decrease risk of diabetes. She denies nausea or hypoglycemia.  ASSESSMENT AND PLAN:  Hypokalemia  Prediabetes - Plan: metFORMIN (GLUCOPHAGE) 500 MG tablet  Class 1 obesity with serious comorbidity and body mass index (BMI) of 34.0 to 34.9 in adult, unspecified obesity type  PLAN:  Hypokalemia Armie is to follow up with Nephrology, and she agrees to follow up with our clinic in 2 weeks.  Pre-Diabetes Lori Le will continue to work on weight loss, exercise, and  decreasing simple carbohydrates in her diet to help decrease the risk of diabetes. We dicussed metformin including benefits and risks. She was informed that eating too many simple carbohydrates or too many calories at one sitting increases the likelihood of GI side effects. [name] agrees to continue taking metformin 500 mg PO daily #30 and we will refill for 1 month. [name] agrees to follow up with our clinic in 2 weeks as directed to monitor her progress.  Obesity Lori Le is currently in the action stage of change. As such, her goal is to continue with weight loss efforts She has agreed to follow the Category 2 plan Lori Le has been instructed to work up to a goal of 150 minutes of combined cardio and strengthening exercise per week or continue walking 3-4 times per week for weight loss and overall health benefits. We discussed the following Behavioral Modification Strategies today: increasing lean protein intake, work on meal planning and easy cooking plans, emotional eating strategies, ways to avoid boredom eating, keeping healthy foods in the home, better snacking choices, and planning for success   Lori Le has agreed to follow up with our clinic in 2 weeks. She was informed of the importance of frequent follow up visits to maximize her success with intensive lifestyle modifications for her multiple health conditions.  ALLERGIES: Allergies  Allergen Reactions  . Contrast Media [Iodinated Diagnostic Agents] Anaphylaxis    MRI dye  . Etodolac Anaphylaxis  . Influenza Vaccines Hives  . Lactose Nausea And Vomiting    Can  have yogurt Can not have milk and ice cream   . Lactose Intolerance (Gi) Nausea And Vomiting    Can have yogurt Can not have milk and ice cream    . Bacitracin-Neomycin-Polymyxin Rash  . Neomycin-Bacitracin Zn-Polymyx Rash  . Neosporin + Pain Relief Max St [Neomy-Bacit-Polymyx-Pramoxine] Rash    MEDICATIONS: Current Outpatient Medications on File Prior to Visit  Medication Sig  Dispense Refill  . acetaminophen (TYLENOL) 650 MG CR tablet Take 1,300 mg by mouth 2 (two) times daily.    [name] allopurinol (ZYLOPRIM) 300 MG tablet TAKE 1 TABLET BY MOUTH EVERY DAY 90 tablet 2  . furosemide (LASIX) 40 MG tablet Take 1 tablet (40 mg total) by mouth daily. 90 tablet 0  . gabapentin (NEURONTIN) 100 MG capsule Take 200 mg by mouth 2 (two) times daily.    . metolazone (ZAROXOLYN) 5 MG tablet Take 5 mg by mouth daily.    . montelukast (SINGULAIR) 10 MG tablet Take 10 mg by mouth at bedtime.    . pantoprazole (PROTONIX) 40 MG tablet Take 1 tablet (40 mg total) by mouth 2 (two) times daily for 30 days. 60 tablet 1  . potassium chloride SA (K-DUR,KLOR-CON) 20 MEQ tablet Take two tablets (40 meq) by mouth daily in the morning and 1 tablet (20 meq) by mouth daily in the evening 90 tablet 0  . sertraline (ZOLOFT) 50 MG tablet Take 1 tablet (50 mg total) by mouth daily. 90 tablet 0  . triamterene-hydrochlorothiazide (MAXZIDE-25) 37.5-25 MG tablet TAKE 1 TABLET BY MOUTH EVERY DAY 90 tablet 0  . Vitamin D, Ergocalciferol, (DRISDOL) 1.25 MG (50000 UT) CAPS capsule Take 1 capsule (50,000 Units total) by mouth every 7 (seven) days. 4 capsule 0   Current Facility-Administered Medications on File Prior to Visit  Medication Dose Route Frequency Provider Last Rate Last Dose  . 0.9 %  sodium chloride infusion   Intravenous Continuous Lequita Asal, MD 0 mL/hr at 05/08/17 1437    . cyanocobalamin ((VITAMIN B-12)) injection 1,000 mcg  1,000 mcg Intramuscular Once Lequita Asal, MD        PAST MEDICAL HISTORY: Past Medical History:  Diagnosis Date  . Allergic rhinitis due to allergen   . Allergy   . Anemia   . Anxiety   . Arthritis   . B12 deficiency   . Cancer ([place])    skin ca  . Chronic idiopathic urticaria   . Complication of anesthesia    nausea, slow to wake up  . Depression   . Edema   . Gastric ulcer   . GERD (gastroesophageal reflux disease)   . Gout   . Gout   .  Hashimoto's thyroiditis   . Heart murmur   . History of eating disorder   . Hives   . Hyperlipidemia   . Hypertension   . Hypothyroidism   . Lower extremity edema   . Melanoma in situ ([place])    left shoulder  . Migraine   . Mitral valve disorder   . Motion sickness    all moving vehicles  . Neuropathy   . PCOS (polycystic ovarian syndrome)   . PONV (postoperative nausea and vomiting)   . Thyroid disease   . Ulcer   . Vertigo    last episode over 1 yr ago    PAST SURGICAL HISTORY: Past Surgical History:  Procedure Laterality Date  . BREAST BIOPSY Right 2007   neg- bx/clip  . CHOLECYSTECTOMY  2003  . COLONOSCOPY WITH  PROPOFOL N/A 05/11/2018   Procedure: COLONOSCOPY WITH PROPOFOL;  Surgeon: [name], MD;  Location: [facility] ENDOSCOPY;  Service: Endoscopy;  Laterality: N/A;  . DILITATION & CURRETTAGE/HYSTROSCOPY WITH NOVASURE ABLATION N/A 08/19/2016   Procedure: DILATATION & CURETTAGE/HYSTEROSCOPY WITH NOVASURE ABLATION;  Surgeon: [name], MD;  Location: [facility] ORS;  Service: Gynecology;  Laterality: N/A;  . ESOPHAGOGASTRODUODENOSCOPY (EGD) WITH PROPOFOL N/A 12/05/2015   Procedure: ESOPHAGOGASTRODUODENOSCOPY (EGD) WITH PROPOFOL;  Surgeon: Lollie Sails, MD;  Location: [facility] ENDOSCOPY;  Service: Endoscopy;  Laterality: N/A;  . ESOPHAGOGASTRODUODENOSCOPY (EGD) WITH PROPOFOL N/A 02/06/2016   Procedure: ESOPHAGOGASTRODUODENOSCOPY (EGD) WITH PROPOFOL;  Surgeon: Lollie Sails, MD;  Location: [facility] ENDOSCOPY;  Service: Endoscopy;  Laterality: N/A;  . ESOPHAGOGASTRODUODENOSCOPY (EGD) WITH PROPOFOL N/A 05/11/2018   Procedure: ESOPHAGOGASTRODUODENOSCOPY (EGD) WITH PROPOFOL;  Surgeon: [name], MD;  Location: [facility] ENDOSCOPY;  Service: Endoscopy;  Laterality: N/A;  . FOOT SURGERY  1994  . GASTRIC BYPASS  2011  . JOINT REPLACEMENT Left 09/27/2014   HIP  . JOINT REPLACEMENT Left 12/01/2014   KNEE  . KNEE ARTHROSCOPY Right   . KNEE ARTHROSCOPY Left 03/23/2015    Procedure: ARTHROSCOPY KNEE, partial synovectomy;  Surgeon: Hessie Knows, MD;  Location: [facility] ORS;  Service: Orthopedics;  Laterality: Left;  . LAPAROSCOPIC GASTRIC BANDING  2010  . ROTATOR CUFF REPAIR Bilateral    right shoulder 07-26-2016  . SHOULDER ARTHROSCOPY Left 06/20/2015   Procedure: ARTHROSCOPY SHOULDER, REPAIR OF MASSIVE ROTATOR CUFF TEAR, TENODESIS, DECOMPRESSION DEBRIDEMENT;  Surgeon: Corky Mull, MD;  Location: [facility] ORS;  Service: Orthopedics;  Laterality: Left;  . TOTAL HIP ARTHROPLASTY Left 09/27/2014   Procedure: TOTAL HIP ARTHROPLASTY ANTERIOR APPROACH;  Surgeon: Hessie Knows, MD;  Location: [facility] ORS;  Service: Orthopedics;  Laterality: Left;  . TOTAL KNEE ARTHROPLASTY Left 12/01/2014   Procedure: TOTAL KNEE ARTHROPLASTY;  Surgeon: Hessie Knows, MD;  Location: [facility] ORS;  Service: Orthopedics;  Laterality: Left;    SOCIAL HISTORY: Social History   Tobacco Use  . Smoking status: Never Smoker  . Smokeless tobacco: Never Used  Substance Use Topics  . Alcohol use: Yes    Alcohol/week: 0.0 - 1.0 standard drinks    Comment: occ.  . Drug use: No    FAMILY HISTORY: Family History  Problem Relation Age of Onset  . Arthritis Mother   . Hyperlipidemia Mother   . Hypertension Mother   . Diabetes Mother   . Sleep apnea Mother   . Obesity Mother   . Arthritis Father   . Hyperlipidemia Father   . Hypertension Father   . Mental illness Father   . Diabetes Father   . Obesity Father   . Sleep apnea Father   . Bipolar disorder Father   . Anxiety disorder Father   . Depression Father   . Cancer Father   . Kidney disease Father   . Arthritis Maternal Grandmother   . Cancer Maternal Grandmother        breast cancer  . Hyperlipidemia Maternal Grandmother   . Hypertension Maternal Grandmother   . Breast cancer Maternal Grandmother   . Arthritis Maternal Grandfather   . Hyperlipidemia Maternal Grandfather   . Hypertension Maternal Grandfather   . Heart disease Maternal  Grandfather        heart attack  . Breast cancer Paternal Grandmother     ROS: Review of Systems  Constitutional: Negative for weight loss.  Gastrointestinal: Negative for nausea.  Endo/Heme/Allergies:       Negative hypoglycemia  PHYSICAL EXAM: Pt in no acute distress  RECENT LABS AND TESTS: BMET    Component Value Date/Time   NA 135 07/09/2018 1459   K 3.0 (L) 07/09/2018 1459   K 4.0 09/27/2014   CL 86 (L) 07/09/2018 1459   CO2 26 07/09/2018 1459   GLUCOSE 180 (H) 07/09/2018 1459   GLUCOSE 101 (H) 05/26/2018 0842   BUN 26 (H) 07/09/2018 1459   CREATININE 0.80 07/09/2018 1459   CREATININE 0.81 09/12/2017 1529   CALCIUM 10.2 07/09/2018 1459   GFRNONAA 85 07/09/2018 1459   GFRAA 98 07/09/2018 1459   Lab Results  Component Value Date   HGBA1C 5.8 05/26/2018   HGBA1C 5.5 04/10/2017   HGBA1C 5.4 04/07/2015   Lab Results  Component Value Date   INSULIN 12.2 06/17/2018   CBC    Component Value Date/Time   WBC 10.5 07/09/2018 1459   WBC 8.0 05/22/2018 1540   RBC 4.59 07/09/2018 1459   RBC 4.76 05/22/2018 1540   HGB 14.2 07/09/2018 1459   HCT 40.4 07/09/2018 1459   PLT 316 07/09/2018 1459   MCV 88 07/09/2018 1459   MCH 30.9 07/09/2018 1459   MCH 30.5 05/22/2018 1540   MCHC 35.1 07/09/2018 1459   MCHC 34.4 05/22/2018 1540   RDW 13.0 07/09/2018 1459   LYMPHSABS 2.6 07/09/2018 1459   MONOABS 0.2 05/22/2018 1540   EOSABS 0.1 07/09/2018 1459   BASOSABS 0.1 07/09/2018 1459   Iron/TIBC/Ferritin/ %Sat    Component Value Date/Time   IRON 31 05/22/2018 1540   TIBC 373 05/22/2018 1540   FERRITIN 33 05/22/2018 1540   FERRITIN 42 04/07/2015 0938   IRONPCTSAT 8 (L) 05/22/2018 1540   Lipid Panel     Component Value Date/Time   CHOL 178 05/26/2018 0842   CHOL 202 (H) 04/10/2017 0821   TRIG 256.0 (H) 05/26/2018 0842   HDL 62.60 05/26/2018 0842   HDL 67 04/10/2017 0821   CHOLHDL 3 05/26/2018 0842   VLDL 51.2 (H) 05/26/2018 0842   LDLCALC 82 04/10/2017 0821    LDLDIRECT 106.0 05/26/2018 0842   Hepatic Function Panel     Component Value Date/Time   PROT 6.7 05/26/2018 0842   PROT 6.7 04/10/2017 0821   ALBUMIN 4.4 05/26/2018 0842   ALBUMIN 4.2 04/10/2017 0821   AST 11 05/26/2018 0842   ALT 23 05/26/2018 0842   ALKPHOS 50 05/26/2018 0842   BILITOT 0.5 05/26/2018 0842   BILITOT 0.3 04/10/2017 0821   BILIDIR 0.1 09/12/2017 1529   BILIDIR 0.09 04/10/2017 0821   IBILI 0.2 09/12/2017 1529      Component Value Date/Time   TSH 0.57 05/26/2018 0842   TSH 0.54 12/26/2016 0805   TSH 1.40 02/28/2015 1406      I, Trixie Dredge, am acting as transcriptionist for Ilene Qua, MD  I have reviewed the above documentation for accuracy and completeness, and I agree with the above. - Ilene Qua, MD

## 2018-07-21 NOTE — Telephone Encounter (Signed)
Pt has lab appt on 4/22. Please place future orders. 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

## 2018-07-22 ENCOUNTER — Other Ambulatory Visit (INDEPENDENT_AMBULATORY_CARE_PROVIDER_SITE_OTHER): Payer: Managed Care, Other (non HMO)

## 2018-07-22 ENCOUNTER — Other Ambulatory Visit: Payer: Self-pay

## 2018-07-22 DIAGNOSIS — E876 Hypokalemia: Secondary | ICD-10-CM

## 2018-07-22 DIAGNOSIS — E878 Other disorders of electrolyte and fluid balance, not elsewhere classified: Secondary | ICD-10-CM

## 2018-07-23 LAB — BASIC METABOLIC PANEL
BUN/Creatinine Ratio: 32 — ABNORMAL HIGH (ref 9–23)
BUN: 26 mg/dL — ABNORMAL HIGH (ref 6–24)
CO2: 28 mmol/L (ref 20–29)
Calcium: 10.1 mg/dL (ref 8.7–10.2)
Chloride: 89 mmol/L — ABNORMAL LOW (ref 96–106)
Creatinine, Ser: 0.81 mg/dL (ref 0.57–1.00)
GFR calc Af Amer: 97 mL/min/{1.73_m2} (ref >59)
GFR calc non Af Amer: 84 mL/min/{1.73_m2} (ref >59)
Glucose: 126 mg/dL — ABNORMAL HIGH (ref 65–99)
Potassium: 4.1 mmol/L (ref 3.5–5.2)
Sodium: 136 mmol/L (ref 134–144)

## 2018-07-24 ENCOUNTER — Other Ambulatory Visit: Payer: Self-pay | Admitting: Internal Medicine

## 2018-07-24 ENCOUNTER — Telehealth: Payer: Self-pay

## 2018-07-24 DIAGNOSIS — E878 Other disorders of electrolyte and fluid balance, not elsewhere classified: Secondary | ICD-10-CM

## 2018-07-24 NOTE — Telephone Encounter (Signed)
-----   Message from Emily Filbert, RN sent at 07/23/2018  5:14 PM EDT ----- Marykay Lex ladies- she has an echo result in the triage basket- I secure chatted Dr. .Rockey Situ today to clarify about if her potassium needed to be changed with his suggestion about decreasing her diuretic.  He told me to look at his note, he thought he changed it at her visit.   Can someone verify what she is taking for potassium  Her med list says: 20 meq- 2 in the AM & 1 in the PM.  His note on 3/2 says: "She is on high-dose diuretics Lasix 40 daily with HCTZ 25 daily now on potassium 20 twice daily"  She has had hypokalemia, but needed to clarify with him what he wanted to do.   Izora Gala made me clock out so I didn't get to call her to find out.   Thanks girls! Have a great weekend!!

## 2018-07-24 NOTE — Telephone Encounter (Signed)
Contacted the pt to get clarification on how she is taking Potassium per Nira Conn, RN message below. [email or web address].

## 2018-07-24 NOTE — Telephone Encounter (Signed)
Spoke with the pt sts that she is  Taking Potassium 43mEq in the morning and 71mEq in the afternoon.  Adv the pt Dr.Gollan is recommended a reduction in her diuretics, but needed to have the correct dosage of her Potassium because that will likely be reduced as well.  Adv the pt that I will fwd the update to Dr.Gollan and we will call back with his recommendation. Pt verbalized understanding.

## 2018-07-24 NOTE — Telephone Encounter (Signed)
Spoke with the pt. Pt made aware of her echo results with verbalized understanding. Pt sts that she is not currently at home, but she will be home soon. She will need to look at her Potassium prescription bottle and callback to provide her current Potassium dosage.

## 2018-07-24 NOTE — Progress Notes (Signed)
Order placed for f/u met b 

## 2018-07-26 NOTE — Telephone Encounter (Signed)
Can we confirm how much lasix /potassium she is taking? On my last office visit, recommended she decrease lasix down to 20 daily with HCTZ 25 Sounds like she has continued on potassium 40 in AM and 20 in PM Potassium 4 BUN elevated , still looks a little dry  If the above is correct, Would hold the lasix, Continue HCTZ Take potassium 40 daily Repeat bmp in 2-3 weeks

## 2018-07-27 ENCOUNTER — Telehealth: Payer: Self-pay | Admitting: Gastroenterology

## 2018-07-27 ENCOUNTER — Other Ambulatory Visit: Payer: Self-pay

## 2018-07-27 ENCOUNTER — Telehealth: Payer: Self-pay

## 2018-07-27 MED ORDER — PANTOPRAZOLE SODIUM 40 MG PO TBEC: 40.0000 mg | DELAYED_RELEASE_TABLET | Freq: Two times a day (BID) | ORAL | 1 refills | Status: DC

## 2018-07-27 NOTE — Telephone Encounter (Signed)
[facility] sent in a fax stating patient's insurance requires a 90 day supply for pantoprazole (PROTONIX) 40 MG tablet. Is this Ok to give 90 day supply?  Fax placed in Debbie's box

## 2018-07-27 NOTE — Telephone Encounter (Signed)
error 

## 2018-07-27 NOTE — Telephone Encounter (Signed)
Patient called in stating she needs a 90 day refill on her pantoprazole (PROTONIX) 40 MG tablet instead of the 60 day called in so her insurance will cover it. She uses the [facility] in [place].

## 2018-07-27 NOTE — Telephone Encounter (Signed)
Received  A fax from [facility] cb [phone] for rx PANTOPRAZOLE 40 MG TABLETS qt 90s insurance requires 90 days

## 2018-07-28 MED ORDER — PANTOPRAZOLE SODIUM 40 MG PO TBEC: 40.0000 mg | DELAYED_RELEASE_TABLET | Freq: Two times a day (BID) | ORAL | 0 refills | Status: DC

## 2018-07-28 NOTE — Telephone Encounter (Signed)
Pt is calling to check on rx she states her stomach is hurting her real bad

## 2018-07-28 NOTE — Telephone Encounter (Signed)
Left message that Dr. Bonna Gains has approved #90 supply, that the first rx was temporary and I will be sending that in. To contact office for a telemed visit in 30 days. Will send front desk message to try and contact pt.

## 2018-07-29 ENCOUNTER — Encounter (INDEPENDENT_AMBULATORY_CARE_PROVIDER_SITE_OTHER): Payer: Self-pay | Admitting: Family Medicine

## 2018-07-29 ENCOUNTER — Other Ambulatory Visit: Payer: Self-pay

## 2018-07-29 ENCOUNTER — Ambulatory Visit (INDEPENDENT_AMBULATORY_CARE_PROVIDER_SITE_OTHER): Payer: Managed Care, Other (non HMO) | Admitting: Family Medicine

## 2018-07-29 ENCOUNTER — Telehealth: Payer: Self-pay | Admitting: Gastroenterology

## 2018-07-29 DIAGNOSIS — Z6834 Body mass index (BMI) 34.0-34.9, adult: Secondary | ICD-10-CM

## 2018-07-29 DIAGNOSIS — E669 Obesity, unspecified: Secondary | ICD-10-CM

## 2018-07-29 DIAGNOSIS — R7303 Prediabetes: Secondary | ICD-10-CM | POA: Diagnosis not present

## 2018-07-29 NOTE — Progress Notes (Signed)
Office: [phone]  /  Fax: [phone] TeleHealth Visit:  MEHREEN AZIZI has verbally consented to this TeleHealth visit today. The patient is located at home, the provider is located at the [facility] office. The participants in this visit include the listed provider and patient and any and all parties involved. The visit was conducted today via WebEx.  HPI:   Chief Complaint: OBESITY Lori Le is here to discuss her progress with her obesity treatment plan. She is on the Category 2 plan and is following her eating plan approximately 100 % of the time. She states she is walking for 30 to 120 minutes 4 to 7 times per week. Marnell is at 214 pounds today. Meal plan wise, Kallyn has been following the plan 100%. Patient is still struggling with significant skin rash on her scalp and face. She was sick to her stomach most of the day yesterday. Nelline is back on Prednisone. We were unable to weigh the patient today for this TeleHealth visit. She feels as if she has lost weight since her last visit. She has lost 8 lbs since starting treatment with [name].  Pre-Diabetes Loukisha has a diagnosis of prediabetes based on her elevated Hgb A1c and was informed this puts her at greater risk of developing diabetes. She denies any GI side effects of metformin. Murlean continues to work on diet and exercise to decrease risk of diabetes. She denies nausea or hypoglycemia.  ASSESSMENT AND PLAN:  Prediabetes  Class 1 obesity with serious comorbidity and body mass index (BMI) of 34.0 to 34.9 in adult, unspecified obesity type  PLAN:  Pre-Diabetes Rashonda will continue to work on weight loss, exercise, and decreasing simple carbohydrates in her diet to help decrease the risk of diabetes. We dicussed metformin including benefits and risks. She was informed that eating too many simple carbohydrates or too many calories at one sitting increases the likelihood of GI side effects. Avana will continue metformin and follow  up with [name] as directed to monitor her progress.  Obesity Sheria is currently in the action stage of change. As such, her goal is to continue with weight loss efforts She has agreed to follow the Category 2 plan Artemis will continue her current physical activity for weight loss and overall health benefits. We discussed the following Behavioral Modification Strategies today: planning for success, keeping healthy foods in the home, increasing lean protein intake, decreasing simple carbohydrates, increasing vegetables and work on meal planning and easy cooking plans  Mara has agreed to follow up with our clinic in 2 weeks. She was informed of the importance of frequent follow up visits to maximize her success with intensive lifestyle modifications for her multiple health conditions.  ALLERGIES: Allergies  Allergen Reactions  . Contrast Media [Iodinated Diagnostic Agents] Anaphylaxis    MRI dye  . Etodolac Anaphylaxis  . Influenza Vaccines Hives  . Lactose Nausea And Vomiting    Can have yogurt Can not have milk and ice cream   . Lactose Intolerance (Gi) Nausea And Vomiting    Can have yogurt Can not have milk and ice cream    . Bacitracin-Neomycin-Polymyxin Rash  . Neomycin-Bacitracin Zn-Polymyx Rash  . Neosporin + Pain Relief Max St [Neomy-Bacit-Polymyx-Pramoxine] Rash    MEDICATIONS: Current Outpatient Medications on File Prior to Visit  Medication Sig Dispense Refill  . acetaminophen (TYLENOL) 650 MG CR tablet Take 1,300 mg by mouth 2 (two) times daily.    [name] allopurinol (ZYLOPRIM) 300 MG tablet TAKE 1  TABLET BY MOUTH EVERY DAY 90 tablet 2  . furosemide (LASIX) 40 MG tablet Take 1 tablet (40 mg total) by mouth daily. 90 tablet 0  . gabapentin (NEURONTIN) 100 MG capsule Take 200 mg by mouth 2 (two) times daily.    . metFORMIN (GLUCOPHAGE) 500 MG tablet Take 1 tablet (500 mg total) by mouth daily with breakfast. 30 tablet 0  . metolazone (ZAROXOLYN) 5 MG tablet Take 5 mg by mouth daily.     . montelukast (SINGULAIR) 10 MG tablet Take 10 mg by mouth at bedtime.    . pantoprazole (PROTONIX) 40 MG tablet Take 1 tablet (40 mg total) by mouth 2 (two) times daily before a meal. 180 tablet 0  . potassium chloride SA (K-DUR,KLOR-CON) 20 MEQ tablet Take two tablets (40 meq) by mouth daily in the morning and 1 tablet (20 meq) by mouth daily in the evening 90 tablet 0  . sertraline (ZOLOFT) 50 MG tablet Take 1 tablet (50 mg total) by mouth daily. 90 tablet 0  . triamterene-hydrochlorothiazide (MAXZIDE-25) 37.5-25 MG tablet TAKE 1 TABLET BY MOUTH EVERY DAY 90 tablet 0  . Vitamin D, Ergocalciferol, (DRISDOL) 1.25 MG (50000 UT) CAPS capsule Take 1 capsule (50,000 Units total) by mouth every 7 (seven) days. 4 capsule 0   Current Facility-Administered Medications on File Prior to Visit  Medication Dose Route Frequency Provider Last Rate Last Dose  . 0.9 %  sodium chloride infusion   Intravenous Continuous Lequita Asal, MD 0 mL/hr at 05/08/17 1437    . cyanocobalamin ((VITAMIN B-12)) injection 1,000 mcg  1,000 mcg Intramuscular Once Lequita Asal, MD        PAST MEDICAL HISTORY: Past Medical History:  Diagnosis Date  . Allergic rhinitis due to allergen   . Allergy   . Anemia   . Anxiety   . Arthritis   . B12 deficiency   . Cancer ([place])    skin ca  . Chronic idiopathic urticaria   . Complication of anesthesia    nausea, slow to wake up  . Depression   . Edema   . Gastric ulcer   . GERD (gastroesophageal reflux disease)   . Gout   . Gout   . Hashimoto's thyroiditis   . Heart murmur   . History of eating disorder   . Hives   . Hyperlipidemia   . Hypertension   . Hypothyroidism   . Lower extremity edema   . Melanoma in situ ([place])    left shoulder  . Migraine   . Mitral valve disorder   . Motion sickness    all moving vehicles  . Neuropathy   . PCOS (polycystic ovarian syndrome)   . PONV (postoperative nausea and vomiting)   . Thyroid disease   . Ulcer   .  Vertigo    last episode over 1 yr ago    PAST SURGICAL HISTORY: Past Surgical History:  Procedure Laterality Date  . BREAST BIOPSY Right 2007   neg- bx/clip  . CHOLECYSTECTOMY  2003  . COLONOSCOPY WITH PROPOFOL N/A 05/11/2018   Procedure: COLONOSCOPY WITH PROPOFOL;  Surgeon: [name], MD;  Location: [facility] ENDOSCOPY;  Service: Endoscopy;  Laterality: N/A;  . DILITATION & CURRETTAGE/HYSTROSCOPY WITH NOVASURE ABLATION N/A 08/19/2016   Procedure: DILATATION & CURETTAGE/HYSTEROSCOPY WITH NOVASURE ABLATION;  Surgeon: [name], MD;  Location: [facility] ORS;  Service: Gynecology;  Laterality: N/A;  . ESOPHAGOGASTRODUODENOSCOPY (EGD) WITH PROPOFOL N/A 12/05/2015   Procedure: ESOPHAGOGASTRODUODENOSCOPY (EGD) WITH PROPOFOL;  Surgeon:  Lollie Sails, MD;  Location: [facility] ENDOSCOPY;  Service: Endoscopy;  Laterality: N/A;  . ESOPHAGOGASTRODUODENOSCOPY (EGD) WITH PROPOFOL N/A 02/06/2016   Procedure: ESOPHAGOGASTRODUODENOSCOPY (EGD) WITH PROPOFOL;  Surgeon: Lollie Sails, MD;  Location[facility] ENDOSCOPY;  Service: Endoscopy;  Laterality: N/A;  . ESOPHAGOGASTRODUODENOSCOPY (EGD) WITH PROPOFOL N/A 05/11/2018   Procedure: ESOPHAGOGASTRODUODENOSCOPY (EGD) WITH PROPOFOL;  Surgeon: [name], MD;  Location: [facility] ENDOSCOPY;  Service: Endoscopy;  Laterality: N/A;  . FOOT SURGERY  1994  . GASTRIC BYPASS  2011  . JOINT REPLACEMENT Left 09/27/2014   HIP  . JOINT REPLACEMENT Left 12/01/2014   KNEE  . KNEE ARTHROSCOPY Right   . KNEE ARTHROSCOPY Left 03/23/2015   Procedure: ARTHROSCOPY KNEE, partial synovectomy;  Surgeon: Hessie Knows, MD;  Location: [facility] ORS;  Service: Orthopedics;  Laterality: Left;  . LAPAROSCOPIC GASTRIC BANDING  2010  . ROTATOR CUFF REPAIR Bilateral    right shoulder 07-26-2016  . SHOULDER ARTHROSCOPY Left 06/20/2015   Procedure: ARTHROSCOPY SHOULDER, REPAIR OF MASSIVE ROTATOR CUFF TEAR, TENODESIS, DECOMPRESSION DEBRIDEMENT;  Surgeon: Corky Mull, MD;  Location: [facility] ORS;   Service: Orthopedics;  Laterality: Left;  . TOTAL HIP ARTHROPLASTY Left 09/27/2014   Procedure: TOTAL HIP ARTHROPLASTY ANTERIOR APPROACH;  Surgeon: Hessie Knows, MD;  Location: [facility] ORS;  Service: Orthopedics;  Laterality: Left;  . TOTAL KNEE ARTHROPLASTY Left 12/01/2014   Procedure: TOTAL KNEE ARTHROPLASTY;  Surgeon: Hessie Knows, MD;  Location: [facility] ORS;  Service: Orthopedics;  Laterality: Left;    SOCIAL HISTORY: Social History   Tobacco Use  . Smoking status: Never Smoker  . Smokeless tobacco: Never Used  Substance Use Topics  . Alcohol use: Yes    Alcohol/week: 0.0 - 1.0 standard drinks    Comment: occ.  . Drug use: No    FAMILY HISTORY: Family History  Problem Relation Age of Onset  . Arthritis Mother   . Hyperlipidemia Mother   . Hypertension Mother   . Diabetes Mother   . Sleep apnea Mother   . Obesity Mother   . Arthritis Father   . Hyperlipidemia Father   . Hypertension Father   . Mental illness Father   . Diabetes Father   . Obesity Father   . Sleep apnea Father   . Bipolar disorder Father   . Anxiety disorder Father   . Depression Father   . Cancer Father   . Kidney disease Father   . Arthritis Maternal Grandmother   . Cancer Maternal Grandmother        breast cancer  . Hyperlipidemia Maternal Grandmother   . Hypertension Maternal Grandmother   . Breast cancer Maternal Grandmother   . Arthritis Maternal Grandfather   . Hyperlipidemia Maternal Grandfather   . Hypertension Maternal Grandfather   . Heart disease Maternal Grandfather        heart attack  . Breast cancer Paternal Grandmother     ROS: Review of Systems  Constitutional: Positive for weight loss.  Gastrointestinal: Positive for nausea. Negative for diarrhea and vomiting.  Endo/Heme/Allergies:       Negative for hypoglycemia    PHYSICAL EXAM: Pt in no acute distress  RECENT LABS AND TESTS: BMET    Component Value Date/Time   NA 136 07/22/2018 1529   K 4.1 07/22/2018 1529   K 4.0  09/27/2014   CL 89 (L) 07/22/2018 1529   CO2 28 07/22/2018 1529   GLUCOSE 126 (H) 07/22/2018 1529   GLUCOSE 101 (H) 05/26/2018 0842   BUN 26 (H) 07/22/2018  1529   CREATININE 0.81 07/22/2018 1529   CREATININE 0.81 09/12/2017 1529   CALCIUM 10.1 07/22/2018 1529   GFRNONAA 84 07/22/2018 1529   GFRAA 97 07/22/2018 1529   Lab Results  Component Value Date   HGBA1C 5.8 05/26/2018   HGBA1C 5.5 04/10/2017   HGBA1C 5.4 04/07/2015   Lab Results  Component Value Date   INSULIN 12.2 06/17/2018   CBC    Component Value Date/Time   WBC 10.5 07/09/2018 1459   WBC 8.0 05/22/2018 1540   RBC 4.59 07/09/2018 1459   RBC 4.76 05/22/2018 1540   HGB 14.2 07/09/2018 1459   HCT 40.4 07/09/2018 1459   PLT 316 07/09/2018 1459   MCV 88 07/09/2018 1459   MCH 30.9 07/09/2018 1459   MCH 30.5 05/22/2018 1540   MCHC 35.1 07/09/2018 1459   MCHC 34.4 05/22/2018 1540   RDW 13.0 07/09/2018 1459   LYMPHSABS 2.6 07/09/2018 1459   MONOABS 0.2 05/22/2018 1540   EOSABS 0.1 07/09/2018 1459   BASOSABS 0.1 07/09/2018 1459   Iron/TIBC/Ferritin/ %Sat    Component Value Date/Time   IRON 31 05/22/2018 1540   TIBC 373 05/22/2018 1540   FERRITIN 33 05/22/2018 1540   FERRITIN 42 04/07/2015 0938   IRONPCTSAT 8 (L) 05/22/2018 1540   Lipid Panel     Component Value Date/Time   CHOL 178 05/26/2018 0842   CHOL 202 (H) 04/10/2017 0821   TRIG 256.0 (H) 05/26/2018 0842   HDL 62.60 05/26/2018 0842   HDL 67 04/10/2017 0821   CHOLHDL 3 05/26/2018 0842   VLDL 51.2 (H) 05/26/2018 0842   LDLCALC 82 04/10/2017 0821   LDLDIRECT 106.0 05/26/2018 0842   Hepatic Function Panel     Component Value Date/Time   PROT 6.7 05/26/2018 0842   PROT 6.7 04/10/2017 0821   ALBUMIN 4.4 05/26/2018 0842   ALBUMIN 4.2 04/10/2017 0821   AST 11 05/26/2018 0842   ALT 23 05/26/2018 0842   ALKPHOS 50 05/26/2018 0842   BILITOT 0.5 05/26/2018 0842   BILITOT 0.3 04/10/2017 0821   BILIDIR 0.1 09/12/2017 1529   BILIDIR 0.09 04/10/2017  0821   IBILI 0.2 09/12/2017 1529      Component Value Date/Time   TSH 0.57 05/26/2018 0842   TSH 0.54 12/26/2016 0805   TSH 1.40 02/28/2015 1406    Results for JAZLYNE, GAUGER (MRN [id number]) as of 07/29/2018 15:46  Ref. Range 06/17/2018 10:17  Vitamin D, 25-Hydroxy Latest Ref Range: 30.0 - 100.0 ng/mL 18.4 (L)    I, Doreene Nest, am acting as Location manager for Eber Jones, MD  I have reviewed the above documentation for accuracy and completeness, and I agree with the above. - Ilene Qua, MD

## 2018-07-29 NOTE — Telephone Encounter (Signed)
Received a fax from [facility] cb [phone] Proir authorization is needed from rx PANTOPRAZOLE 40 MG TABLETS Plan does not cover more then 1 tablet a day please call plan at [phone]  To initiate prior auth. Patient ID # [id number]

## 2018-07-29 NOTE — Telephone Encounter (Signed)
Spoke with patient and reviewed medications in detail with her. She was taking Furosemide 1/2 tablet once daily and also had decreased Maxide to 1/2 tablet and was taking one potassium in the AM and one in the PM. Reviewed provider recommendations and instructed her to continue 1/2 tablet of the furosemide, whole tablet of the Maxide, and only take Potassium 40 mEq once a day. Also reviewed request for repeat labs. She states that her PCP has ordered labs to be done 08/04/2018. Will update Dr. Rockey Situ of this information and we will watch for those lab results.

## 2018-08-03 ENCOUNTER — Telehealth: Payer: Self-pay | Admitting: Gastroenterology

## 2018-08-03 NOTE — Telephone Encounter (Signed)
Approved. Left message for pt. This was approved.

## 2018-08-03 NOTE — Telephone Encounter (Signed)
Prior authorization completed and sent today via cover my meds.

## 2018-08-03 NOTE — Telephone Encounter (Signed)
Received a fax from [facility] rx notice of approval rx PANTOPRAZOLE TAB 40 MG

## 2018-08-03 NOTE — Telephone Encounter (Signed)
Left message that medication (Pantoprazole) was approved.

## 2018-08-04 ENCOUNTER — Other Ambulatory Visit: Payer: Self-pay

## 2018-08-04 ENCOUNTER — Telehealth: Payer: Self-pay

## 2018-08-04 NOTE — Telephone Encounter (Signed)
Copied from [place] [phone]. Topic: Appointment Scheduling - Scheduling Inquiry for Clinic >> Aug 03, 2018  4:39 PM Valla Leaver wrote: Reason for CRM: Wants to r/s 05/05 labs for next week.

## 2018-08-04 NOTE — Telephone Encounter (Signed)
appt moved  °

## 2018-08-05 ENCOUNTER — Encounter: Payer: Self-pay | Admitting: Internal Medicine

## 2018-08-06 NOTE — Telephone Encounter (Signed)
Do you mind calling this pt.  She works at [employer].  Is this something they are recommending for her to get now?  Please let her know limitations of test.  Let me know if I need to order.  Thanks for your help.

## 2018-08-10 ENCOUNTER — Other Ambulatory Visit: Payer: Self-pay

## 2018-08-10 ENCOUNTER — Other Ambulatory Visit (INDEPENDENT_AMBULATORY_CARE_PROVIDER_SITE_OTHER): Payer: Managed Care, Other (non HMO)

## 2018-08-10 DIAGNOSIS — E878 Other disorders of electrolyte and fluid balance, not elsewhere classified: Secondary | ICD-10-CM | POA: Diagnosis not present

## 2018-08-10 NOTE — Addendum Note (Signed)
Addended by: Leeanne Rio on: 08/10/2018 10:12 AM   Modules accepted: Orders

## 2018-08-11 ENCOUNTER — Ambulatory Visit (INDEPENDENT_AMBULATORY_CARE_PROVIDER_SITE_OTHER): Payer: Managed Care, Other (non HMO) | Admitting: Family Medicine

## 2018-08-11 ENCOUNTER — Other Ambulatory Visit: Payer: Self-pay | Admitting: Internal Medicine

## 2018-08-11 ENCOUNTER — Encounter (INDEPENDENT_AMBULATORY_CARE_PROVIDER_SITE_OTHER): Payer: Self-pay | Admitting: Family Medicine

## 2018-08-11 DIAGNOSIS — Z6834 Body mass index (BMI) 34.0-34.9, adult: Secondary | ICD-10-CM | POA: Diagnosis not present

## 2018-08-11 DIAGNOSIS — E876 Hypokalemia: Secondary | ICD-10-CM

## 2018-08-11 DIAGNOSIS — E878 Other disorders of electrolyte and fluid balance, not elsewhere classified: Secondary | ICD-10-CM

## 2018-08-11 DIAGNOSIS — R7303 Prediabetes: Secondary | ICD-10-CM | POA: Diagnosis not present

## 2018-08-11 DIAGNOSIS — E669 Obesity, unspecified: Secondary | ICD-10-CM

## 2018-08-11 LAB — BASIC METABOLIC PANEL
BUN/Creatinine Ratio: 16 (ref 9–23)
BUN: 11 mg/dL (ref 6–24)
CO2: 24 mmol/L (ref 20–29)
Calcium: 9.2 mg/dL (ref 8.7–10.2)
Chloride: 97 mmol/L (ref 96–106)
Creatinine, Ser: 0.67 mg/dL (ref 0.57–1.00)
GFR calc Af Amer: 117 mL/min/{1.73_m2} (ref >59)
GFR calc non Af Amer: 101 mL/min/{1.73_m2} (ref >59)
Glucose: 153 mg/dL — ABNORMAL HIGH (ref 65–99)
Potassium: 3.3 mmol/L — ABNORMAL LOW (ref 3.5–5.2)
Sodium: 139 mmol/L (ref 134–144)

## 2018-08-11 NOTE — Progress Notes (Signed)
Order placed for f/u potassium.  

## 2018-08-12 NOTE — Progress Notes (Signed)
Office: [phone]  /  Fax: [phone] TeleHealth Visit:  Lori Le has verbally consented to this TeleHealth visit today. The patient is located at home, the provider is located at the [facility] office. The participants in this visit include the listed provider and patient. The visit was conducted today via webex.  HPI:   Chief Complaint: OBESITY Lori Le is here to discuss her progress with her obesity treatment plan. She is on the Category 2 plan and is following her eating plan approximately 90 % of the time. She states she is walking for 30 minutes 3-7 times per week. Lori Le is still dealing with skin issues and so she is still taking medications to help with skin condition. She may require a biologic agent. She has gained a total of 14 lbs while on prednisone. We were unable to weigh the patient today for this TeleHealth visit. She feels as if she has gained weight since her last visit. She has lost 0 lbs since starting treatment with [name].  Hypochloremia Lori Le was referred to [facility]. Last chloride level was on 08/10/2018 and level within normal limits.  Pre-Diabetes Lori Le has a diagnosis of pre-diabetes based on her elevated Hgb A1c and was informed this puts her at greater risk of developing diabetes. She is taking metformin currently and denies carbohydrate cravings. She continues to work on diet and exercise to decrease risk of diabetes. She denies nausea or hypoglycemia.  ASSESSMENT AND PLAN:  Hypochloremia  Prediabetes  Class 1 obesity with serious comorbidity and body mass index (BMI) of 34.0 to 34.9 in adult, unspecified obesity type  PLAN:  Hypochloremia Lori Le is to follow up with [place] at previously scheduled appointment. Lori Le agrees to follow up with our clinic in 2 weeks.  Pre-Diabetes Lori Le will continue to work on weight loss, exercise, and decreasing simple carbohydrates in her diet to help decrease the risk  of diabetes. We dicussed metformin including benefits and risks. She was informed that eating too many simple carbohydrates or too many calories at one sitting increases the likelihood of GI side effects. Lori Le agrees to continue taking metformin, and we will repeat labs in early or mid June. Lori Le agrees to follow up with our clinic in 2 weeks as directed to monitor her progress.  Obesity Lori Le is currently in the action stage of change. As such, her goal is to continue with weight loss efforts She has agreed to keep a food journal with 1150-1300 calories and 80+ grams of protein daily or follow the Category 2 plan Lori Le has been instructed to work up to a goal of 150 minutes of combined cardio and strengthening exercise per week for weight loss and overall health benefits. We discussed the following Behavioral Modification Strategies today: increasing lean protein intake, increasing vegetables and work on meal planning and easy cooking plans, keeping healthy foods in the home, better snacking choices, and planning for success   Lori Le has agreed to follow up with our clinic in 2 weeks. She was informed of the importance of frequent follow up visits to maximize her success with intensive lifestyle modifications for her multiple health conditions.  ALLERGIES: Allergies  Allergen Reactions  . Contrast Media [Iodinated Diagnostic Agents] Anaphylaxis    MRI dye  . Etodolac Anaphylaxis  . Influenza Vaccines Hives  . Lactose Nausea And Vomiting    Can have yogurt Can not have milk and ice cream   . Lactose Intolerance (Gi) Nausea And Vomiting  Can have yogurt Can not have milk and ice cream    . Bacitracin-Neomycin-Polymyxin Rash  . Neomycin-Bacitracin Zn-Polymyx Rash  . Neosporin + Pain Relief Max St [Neomy-Bacit-Polymyx-Pramoxine] Rash    MEDICATIONS: Current Outpatient Medications on File Prior to Visit  Medication Sig Dispense Refill  . acetaminophen (TYLENOL) 650 MG CR tablet Take 1,300  mg by mouth 2 (two) times daily.    [name] allopurinol (ZYLOPRIM) 300 MG tablet TAKE 1 TABLET BY MOUTH EVERY DAY 90 tablet 2  . furosemide (LASIX) 40 MG tablet Take 1 tablet (40 mg total) by mouth daily. 90 tablet 0  . gabapentin (NEURONTIN) 100 MG capsule Take 200 mg by mouth 2 (two) times daily.    . metFORMIN (GLUCOPHAGE) 500 MG tablet Take 1 tablet (500 mg total) by mouth daily with breakfast. 30 tablet 0  . metolazone (ZAROXOLYN) 5 MG tablet Take 5 mg by mouth daily.    . montelukast (SINGULAIR) 10 MG tablet Take 10 mg by mouth at bedtime.    . pantoprazole (PROTONIX) 40 MG tablet Take 1 tablet (40 mg total) by mouth 2 (two) times daily before a meal. 180 tablet 0  . potassium chloride SA (K-DUR,KLOR-CON) 20 MEQ tablet Take two tablets (40 meq) by mouth daily in the morning and 1 tablet (20 meq) by mouth daily in the evening 90 tablet 0  . sertraline (ZOLOFT) 50 MG tablet Take 1 tablet (50 mg total) by mouth daily. 90 tablet 0  . triamterene-hydrochlorothiazide (MAXZIDE-25) 37.5-25 MG tablet TAKE 1 TABLET BY MOUTH EVERY DAY 90 tablet 0  . Vitamin D, Ergocalciferol, (DRISDOL) 1.25 MG (50000 UT) CAPS capsule Take 1 capsule (50,000 Units total) by mouth every 7 (seven) days. 4 capsule 0   Current Facility-Administered Medications on File Prior to Visit  Medication Dose Route Frequency Provider Last Rate Last Dose  . 0.9 %  sodium chloride infusion   Intravenous Continuous Lequita Asal, MD 0 mL/hr at 05/08/17 1437    . cyanocobalamin ((VITAMIN B-12)) injection 1,000 mcg  1,000 mcg Intramuscular Once Lequita Asal, MD        PAST MEDICAL HISTORY: Past Medical History:  Diagnosis Date  . Allergic rhinitis due to allergen   . Allergy   . Anemia   . Anxiety   . Arthritis   . B12 deficiency   . Cancer ([place])    skin ca  . Chronic idiopathic urticaria   . Complication of anesthesia    nausea, slow to wake up  . Depression   . Edema   . Gastric ulcer   . GERD (gastroesophageal  reflux disease)   . Gout   . Gout   . Hashimoto's thyroiditis   . Heart murmur   . History of eating disorder   . Hives   . Hyperlipidemia   . Hypertension   . Hypothyroidism   . Lower extremity edema   . Melanoma in situ ([place])    left shoulder  . Migraine   . Mitral valve disorder   . Motion sickness    all moving vehicles  . Neuropathy   . PCOS (polycystic ovarian syndrome)   . PONV (postoperative nausea and vomiting)   . Thyroid disease   . Ulcer   . Vertigo    last episode over 1 yr ago    PAST SURGICAL HISTORY: Past Surgical History:  Procedure Laterality Date  . BREAST BIOPSY Right 2007   neg- bx/clip  . CHOLECYSTECTOMY  2003  . COLONOSCOPY WITH  PROPOFOL N/A 05/11/2018   Procedure: COLONOSCOPY WITH PROPOFOL;  Surgeon: [name], MD;  Location: [facility] ENDOSCOPY;  Service: Endoscopy;  Laterality: N/A;  . DILITATION & CURRETTAGE/HYSTROSCOPY WITH NOVASURE ABLATION N/A 08/19/2016   Procedure: DILATATION & CURETTAGE/HYSTEROSCOPY WITH NOVASURE ABLATION;  Surgeon: [name], MD;  Location: [facility] ORS;  Service: Gynecology;  Laterality: N/A;  . ESOPHAGOGASTRODUODENOSCOPY (EGD) WITH PROPOFOL N/A 12/05/2015   Procedure: ESOPHAGOGASTRODUODENOSCOPY (EGD) WITH PROPOFOL;  Surgeon: Lollie Sails, MD;  Location[facility] END[facility]COPY;  Service: Endoscopy;  Laterality: N/A;  . ESOPHAGOGASTRODUODENOSCOPY (EGD) WITH PROPOFOL N/A 02/06/2016   Procedure: ESOPHAGOGASTRODUODENOSCOPY (EGD) WITH PROPOFOL;  Surgeon: Lollie Sails, MD;  Location: [facility] ENDOSCOPY;  Service: Endoscopy;  Laterality: N/A;  . ESOPHAGOGASTRODUODENOSCOPY (EGD) WITH PROPOFOL N/A 05/11/2018   Procedure: ESOPHAGOGASTRODUODENOSCOPY (EGD) WITH PROPOFOL;  Surgeon: [name], MD;  Location: [facility] ENDOSCOPY;  Service: Endoscopy;  Laterality: N/A;  . FOOT SURGERY  1994  . GASTRIC BYPASS  2011  . JOINT REPLACEMENT Left 09/27/2014   HIP  . JOINT REPLACEMENT Left 12/01/2014   KNEE  . KNEE ARTHROSCOPY Right   .  KNEE ARTHROSCOPY Left 03/23/2015   Procedure: ARTHROSCOPY KNEE, partial synovectomy;  Surgeon: Hessie Knows, MD;  Location: [facility] ORS;  Service: Orthopedics;  Laterality: Left;  . LAPAROSCOPIC GASTRIC BANDING  2010  . ROTATOR CUFF REPAIR Bilateral    right shoulder 07-26-2016  . SHOULDER ARTHROSCOPY Left 06/20/2015   Procedure: ARTHROSCOPY SHOULDER, REPAIR OF MASSIVE ROTATOR CUFF TEAR, TENODESIS, DECOMPRESSION DEBRIDEMENT;  Surgeon: Corky Mull, MD;  Location: [facility] ORS;  Service: Orthopedics;  Laterality: Left;  . TOTAL HIP ARTHROPLASTY Left 09/27/2014   Procedure: TOTAL HIP ARTHROPLASTY ANTERIOR APPROACH;  Surgeon: Hessie Knows, MD;  Location: [facility] ORS;  Service: Orthopedics;  Laterality: Left;  . TOTAL KNEE ARTHROPLASTY Left 12/01/2014   Procedure: TOTAL KNEE ARTHROPLASTY;  Surgeon: Hessie Knows, MD;  Location: [facility] ORS;  Service: Orthopedics;  Laterality: Left;    SOCIAL HISTORY: Social History   Tobacco Use  . Smoking status: Never Smoker  . Smokeless tobacco: Never Used  Substance Use Topics  . Alcohol use: Yes    Alcohol/week: 0.0 - 1.0 standard drinks    Comment: occ.  . Drug use: No    FAMILY HISTORY: Family History  Problem Relation Age of Onset  . Arthritis Mother   . Hyperlipidemia Mother   . Hypertension Mother   . Diabetes Mother   . Sleep apnea Mother   . Obesity Mother   . Arthritis Father   . Hyperlipidemia Father   . Hypertension Father   . Mental illness Father   . Diabetes Father   . Obesity Father   . Sleep apnea Father   . Bipolar disorder Father   . Anxiety disorder Father   . Depression Father   . Cancer Father   . Kidney disease Father   . Arthritis Maternal Grandmother   . Cancer Maternal Grandmother        breast cancer  . Hyperlipidemia Maternal Grandmother   . Hypertension Maternal Grandmother   . Breast cancer Maternal Grandmother   . Arthritis Maternal Grandfather   . Hyperlipidemia Maternal Grandfather   . Hypertension Maternal  Grandfather   . Heart disease Maternal Grandfather        heart attack  . Breast cancer Paternal Grandmother     ROS: Review of Systems  Constitutional: Negative for weight loss.  Gastrointestinal: Negative for nausea.  Endo/Heme/Allergies:       Negative hypoglycemia  PHYSICAL EXAM: Pt in no acute distress  RECENT LABS AND TESTS: BMET    Component Value Date/Time   NA 139 08/10/2018 1012   K 3.3 (L) 08/10/2018 1012   K 4.0 09/27/2014   CL 97 08/10/2018 1012   CO2 24 08/10/2018 1012   GLUCOSE 153 (H) 08/10/2018 1012   GLUCOSE 101 (H) 05/26/2018 0842   BUN 11 08/10/2018 1012   CREATININE 0.67 08/10/2018 1012   CREATININE 0.81 09/12/2017 1529   CALCIUM 9.2 08/10/2018 1012   GFRNONAA 101 08/10/2018 1012   GFRAA 117 08/10/2018 1012   Lab Results  Component Value Date   HGBA1C 5.8 05/26/2018   HGBA1C 5.5 04/10/2017   HGBA1C 5.4 04/07/2015   Lab Results  Component Value Date   INSULIN 12.2 06/17/2018   CBC    Component Value Date/Time   WBC 10.5 07/09/2018 1459   WBC 8.0 05/22/2018 1540   RBC 4.59 07/09/2018 1459   RBC 4.76 05/22/2018 1540   HGB 14.2 07/09/2018 1459   HCT 40.4 07/09/2018 1459   PLT 316 07/09/2018 1459   MCV 88 07/09/2018 1459   MCH 30.9 07/09/2018 1459   MCH 30.5 05/22/2018 1540   MCHC 35.1 07/09/2018 1459   MCHC 34.4 05/22/2018 1540   RDW 13.0 07/09/2018 1459   LYMPHSABS 2.6 07/09/2018 1459   MONOABS 0.2 05/22/2018 1540   EOSABS 0.1 07/09/2018 1459   BASOSABS 0.1 07/09/2018 1459   Iron/TIBC/Ferritin/ %Sat    Component Value Date/Time   IRON 31 05/22/2018 1540   TIBC 373 05/22/2018 1540   FERRITIN 33 05/22/2018 1540   FERRITIN 42 04/07/2015 0938   IRONPCTSAT 8 (L) 05/22/2018 1540   Lipid Panel     Component Value Date/Time   CHOL 178 05/26/2018 0842   CHOL 202 (H) 04/10/2017 0821   TRIG 256.0 (H) 05/26/2018 0842   HDL 62.60 05/26/2018 0842   HDL 67 04/10/2017 0821   CHOLHDL 3 05/26/2018 0842   VLDL 51.2 (H) 05/26/2018  0842   LDLCALC 82 04/10/2017 0821   LDLDIRECT 106.0 05/26/2018 0842   Hepatic Function Panel     Component Value Date/Time   PROT 6.7 05/26/2018 0842   PROT 6.7 04/10/2017 0821   ALBUMIN 4.4 05/26/2018 0842   ALBUMIN 4.2 04/10/2017 0821   AST 11 05/26/2018 0842   ALT 23 05/26/2018 0842   ALKPHOS 50 05/26/2018 0842   BILITOT 0.5 05/26/2018 0842   BILITOT 0.3 04/10/2017 0821   BILIDIR 0.1 09/12/2017 1529   BILIDIR 0.09 04/10/2017 0821   IBILI 0.2 09/12/2017 1529      Component Value Date/Time   TSH 0.57 05/26/2018 0842   TSH 0.54 12/26/2016 0805   TSH 1.40 02/28/2015 1406      I, Trixie Dredge, am acting as transcriptionist for Ilene Qua, MD  I have reviewed the above documentation for accuracy and completeness, and I agree with the above. - Ilene Qua, MD

## 2018-08-14 ENCOUNTER — Other Ambulatory Visit (INDEPENDENT_AMBULATORY_CARE_PROVIDER_SITE_OTHER): Payer: Self-pay | Admitting: Family Medicine

## 2018-08-14 DIAGNOSIS — R7303 Prediabetes: Secondary | ICD-10-CM

## 2018-08-14 NOTE — Telephone Encounter (Signed)
error 

## 2018-08-17 ENCOUNTER — Other Ambulatory Visit: Payer: Self-pay

## 2018-08-17 ENCOUNTER — Encounter (HOSPITAL_COMMUNITY): Payer: Self-pay | Admitting: Psychiatry

## 2018-08-17 ENCOUNTER — Ambulatory Visit (INDEPENDENT_AMBULATORY_CARE_PROVIDER_SITE_OTHER): Payer: 59 | Admitting: Psychiatry

## 2018-08-17 DIAGNOSIS — F411 Generalized anxiety disorder: Secondary | ICD-10-CM | POA: Diagnosis not present

## 2018-08-17 DIAGNOSIS — F330 Major depressive disorder, recurrent, mild: Secondary | ICD-10-CM | POA: Diagnosis not present

## 2018-08-17 MED ORDER — SERTRALINE HCL 50 MG PO TABS: 75.0000 mg | ORAL_TABLET | Freq: Every day | ORAL | 0 refills | Status: DC
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
## 2018-08-20 NOTE — Telephone Encounter (Signed)
Sorry I did not see this until today called and advised patient of the limitations of this test and that we are not recommending and would need to be done at [facility] patient says she still wants test due to they may not let her come back to work without it.

## 2018-08-23 ENCOUNTER — Other Ambulatory Visit: Payer: Self-pay

## 2018-08-25 ENCOUNTER — Other Ambulatory Visit: Payer: Self-pay

## 2018-08-25 ENCOUNTER — Emergency Department: Payer: Managed Care, Other (non HMO)

## 2018-08-25 ENCOUNTER — Emergency Department
Admission: EM | Admit: 2018-08-25 | Discharge: 2018-08-25 | Disposition: A | Discharge status: Home / Self Care | Payer: Managed Care, Other (non HMO) | Source: Home / Self Care | Attending: Emergency Medicine | Admitting: Emergency Medicine

## 2018-08-25 ENCOUNTER — Ambulatory Visit: Payer: Self-pay | Admitting: Physician Assistant

## 2018-08-25 ENCOUNTER — Inpatient Hospital Stay: Payer: Managed Care, Other (non HMO) | Attending: Hematology and Oncology

## 2018-08-25 VITALS — BP 118/74 | HR 86 | Temp 98.4°F | Resp 16 | Wt 225.0 lb

## 2018-08-25 DIAGNOSIS — Z79899 Other long term (current) drug therapy: Secondary | ICD-10-CM | POA: Insufficient documentation

## 2018-08-25 DIAGNOSIS — I10 Essential (primary) hypertension: Secondary | ICD-10-CM | POA: Insufficient documentation

## 2018-08-25 DIAGNOSIS — Z7984 Long term (current) use of oral hypoglycemic drugs: Secondary | ICD-10-CM | POA: Insufficient documentation

## 2018-08-25 DIAGNOSIS — K5900 Constipation, unspecified: Secondary | ICD-10-CM

## 2018-08-25 DIAGNOSIS — E039 Hypothyroidism, unspecified: Secondary | ICD-10-CM | POA: Diagnosis not present

## 2018-08-25 DIAGNOSIS — N390 Urinary tract infection, site not specified: Secondary | ICD-10-CM | POA: Insufficient documentation

## 2018-08-25 DIAGNOSIS — R10824 Left lower quadrant rebound abdominal tenderness: Secondary | ICD-10-CM

## 2018-08-25 DIAGNOSIS — Z85828 Personal history of other malignant neoplasm of skin: Secondary | ICD-10-CM | POA: Insufficient documentation

## 2018-08-25 DIAGNOSIS — R300 Dysuria: Secondary | ICD-10-CM

## 2018-08-25 DIAGNOSIS — Z96642 Presence of left artificial hip joint: Secondary | ICD-10-CM | POA: Insufficient documentation

## 2018-08-25 DIAGNOSIS — Z96653 Presence of artificial knee joint, bilateral: Secondary | ICD-10-CM | POA: Insufficient documentation

## 2018-08-25 DIAGNOSIS — D509 Iron deficiency anemia, unspecified: Secondary | ICD-10-CM | POA: Diagnosis present

## 2018-08-25 DIAGNOSIS — K9589 Other complications of other bariatric procedure: Secondary | ICD-10-CM

## 2018-08-25 LAB — URINALYSIS, COMPLETE (UACMP) WITH MICROSCOPIC
Bacteria, UA: NONE SEEN
Bilirubin Urine: NEGATIVE
Glucose, UA: NEGATIVE mg/dL
Ketones, ur: NEGATIVE mg/dL
Nitrite: NEGATIVE
Protein, ur: NEGATIVE mg/dL
Specific Gravity, Urine: 1.016 (ref 1.005–1.030)
WBC, UA: >50 WBC/hpf — ABNORMAL HIGH (ref 0–5)
pH: 6 (ref 5.0–8.0)

## 2018-08-25 LAB — POCT URINALYSIS DIPSTICK
Bilirubin, UA: NEGATIVE
Blood, UA: NEGATIVE
Glucose, UA: NEGATIVE
Ketones, UA: NEGATIVE
Leukocytes, UA: NEGATIVE
Nitrite, UA: NEGATIVE
Protein, UA: NEGATIVE
Spec Grav, UA: 1.015 (ref 1.010–1.025)
Urobilinogen, UA: 0.2 E.U./dL
pH, UA: 5.5 (ref 5.0–8.0)

## 2018-08-25 LAB — COMPREHENSIVE METABOLIC PANEL
ALT: 16 U/L (ref 0–44)
ALT: 16 U/L (ref 0–44)
AST: 11 U/L — ABNORMAL LOW (ref 15–41)
AST: 11 U/L — ABNORMAL LOW (ref 15–41)
Albumin: 4.1 g/dL (ref 3.5–5.0)
Albumin: 4.3 g/dL (ref 3.5–5.0)
Alkaline Phosphatase: 50 U/L (ref 38–126)
Alkaline Phosphatase: 54 U/L (ref 38–126)
Anion gap: 12 (ref 5–15)
Anion gap: 12 (ref 5–15)
BUN: 18 mg/dL (ref 6–20)
BUN: 19 mg/dL (ref 6–20)
CO2: 28 mmol/L (ref 22–32)
CO2: 32 mmol/L (ref 22–32)
Calcium: 8.7 mg/dL — ABNORMAL LOW (ref 8.9–10.3)
Calcium: 8.9 mg/dL (ref 8.9–10.3)
Chloride: 94 mmol/L — ABNORMAL LOW (ref 98–111)
Chloride: 93 mmol/L — ABNORMAL LOW (ref 98–111)
Creatinine, Ser: 0.86 mg/dL (ref 0.44–1.00)
Creatinine, Ser: 0.93 mg/dL (ref 0.44–1.00)
GFR calc Af Amer: >60 mL/min (ref >60)
GFR calc Af Amer: >60 mL/min (ref >60)
GFR calc non Af Amer: >60 mL/min (ref >60)
GFR calc non Af Amer: >60 mL/min (ref >60)
Glucose, Bld: 102 mg/dL — ABNORMAL HIGH (ref 70–99)
Glucose, Bld: 102 mg/dL — ABNORMAL HIGH (ref 70–99)
Potassium: 3 mmol/L — ABNORMAL LOW (ref 3.5–5.1)
Potassium: 3.2 mmol/L — ABNORMAL LOW (ref 3.5–5.1)
Sodium: 134 mmol/L — ABNORMAL LOW (ref 135–145)
Sodium: 137 mmol/L (ref 135–145)
Total Bilirubin: 0.4 mg/dL (ref 0.3–1.2)
Total Bilirubin: 0.5 mg/dL (ref 0.3–1.2)
Total Protein: 7.1 g/dL (ref 6.5–8.1)
Total Protein: 7.2 g/dL (ref 6.5–8.1)

## 2018-08-25 LAB — CBC
HCT: 41.5 % (ref 36.0–46.0)
Hemoglobin: 13.6 g/dL (ref 12.0–15.0)
MCH: 30.6 pg (ref 26.0–34.0)
MCHC: 32.8 g/dL (ref 30.0–36.0)
MCV: 93.3 fL (ref 80.0–100.0)
Platelets: 357 10*3/uL (ref 150–400)
RBC: 4.45 MIL/uL (ref 3.87–5.11)
RDW: 13.2 % (ref 11.5–15.5)
WBC: 10.8 10*3/uL — ABNORMAL HIGH (ref 4.0–10.5)
nRBC: 0 % (ref 0.0–0.2)

## 2018-08-25 LAB — LIPASE, BLOOD: Lipase: 33 U/L (ref 11–51)

## 2018-08-25 LAB — POCT PREGNANCY, URINE: Preg Test, Ur: NEGATIVE

## 2018-08-25 LAB — FERRITIN: Ferritin: 38 ng/mL (ref 11–307)

## 2018-08-25 MED ORDER — CEPHALEXIN 500 MG PO CAPS: 500.0000 mg | ORAL_CAPSULE | Freq: Three times a day (TID) | ORAL | 0 refills | Status: DC

## 2018-08-25 MED ORDER — SODIUM CHLORIDE 0.9% FLUSH: 3.0000 mL | Freq: Once | INTRAVENOUS | Status: DC

## 2018-08-25 MED ORDER — POLYETHYLENE GLYCOL 3350 17 GM/SCOOP PO POWD: 17.0000 g | Freq: Every day | ORAL | 0 refills | Status: DC | PRN

## 2018-08-25 NOTE — ED Triage Notes (Signed)
Pt states she was sent from her PCP. States she was having painful urination for a couple days but had a negative UA and when the doctor palpation her abd she had LLQ pain, states last BM was normal yesterday. Denies N/V/D/fever.

## 2018-08-25 NOTE — ED Notes (Signed)
Patient transported to CT 

## 2018-08-25 NOTE — Patient Instructions (Signed)
Thank you for choosing [facility] for your health care needs.  You have left lower quadrant abdominal pain on examination / palpation of the belly.  Believe further evaluation is warranted.  Go directly to [facility] ED. I have called the ED and let them know that you are on your way.  Abdominal Pain, Adult  Many things can cause belly (abdominal) pain. Most times, belly pain is not dangerous. Many cases of belly pain can be watched and treated at home. Sometimes belly pain is serious, though. Your doctor will try to find the cause of your belly pain. Follow these instructions at home:  Take over-the-counter and prescription medicines only as told by your doctor. Do not take medicines that help you poop (laxatives) unless told to by your doctor.  Drink enough fluid to keep your pee (urine) clear or pale yellow.  Watch your belly pain for any changes.  Keep all follow-up visits as told by your doctor. This is important. Contact a doctor if:  Your belly pain changes or gets worse.  You are not hungry, or you lose weight without trying.  You are having trouble pooping (constipated) or have watery poop (diarrhea) for more than 2-3 days.  You have pain when you pee or poop.  Your belly pain wakes you up at night.  Your pain gets worse with meals, after eating, or with certain foods.  You are throwing up and cannot keep anything down.  You have a fever. Get help right away if:  Your pain does not go away as soon as your doctor says it should.  You cannot stop throwing up.  Your pain is only in areas of your belly, such as the right side or the left lower part of the belly.  You have bloody or black poop, or poop that looks like tar.  You have very bad pain, cramping, or bloating in your belly.  You have signs of not having enough fluid or water in your body (dehydration), such as: ? Dark pee, very little pee, or no pee. ? Cracked lips. ? Dry mouth.  ? Sunken eyes. ? Sleepiness. ? Weakness. This information is not intended to replace advice given to you by your health care provider. Make sure you discuss any questions you have with your health care provider. Document Released: 09/04/2007 Document Revised: 10/06/2015 Document Reviewed: 08/30/2015 Elsevier Interactive Patient Education  2019 [employer].

## 2018-08-25 NOTE — ED Provider Notes (Signed)
[facility] Emergency Department Provider Note  Time seen: 7:08 PM  I have reviewed the triage vital signs and the nursing notes.   HISTORY  Chief Complaint Abdominal Pain   HPI Lori Le is a 53 y.o. female with a past medical history of anxiety, gastric reflux, hypertension, hyperlipidemia, migraines, presents to the emergency department for left lower quadrant abdominal pain.  According to the patient for the past 1 to 2 months she has had intermittent lower abdominal discomfort and intermittent dysuria.  Patient states she thought she had a mild urinary tract infection so she finally went to her doctor however when the doctor pressed on her left lower quadrant she was concerned that she could have another type of infection and referred her to the emergency department for evaluation.  Here the patient denies any left lower quadrant pain at this time states very mild lower abdominal discomfort.  Denies any fever cough congestion or shortness of breath.  Denies any nausea vomiting diarrhea.  States intermittent dysuria which she describes as a mild burning sensation from time to time.    Past Medical History:  Diagnosis Date  . Allergic rhinitis due to allergen   . Allergy   . Anemia   . Anxiety   . Arthritis   . B12 deficiency   . Cancer ([place])    skin ca  . Chronic idiopathic urticaria   . Complication of anesthesia    nausea, slow to wake up  . Depression   . Edema   . Gastric ulcer   . GERD (gastroesophageal reflux disease)   . Gout   . Gout   . Hashimoto's thyroiditis   . Heart murmur   . History of eating disorder   . Hives   . Hyperlipidemia   . Hypertension   . Hypothyroidism   . Lower extremity edema   . Melanoma in situ ([place])    left shoulder  . Migraine   . Mitral valve disorder   . Motion sickness    all moving vehicles  . Neuropathy   . PCOS (polycystic ovarian syndrome)   . PONV (postoperative nausea and vomiting)   . Thyroid  disease   . Ulcer   . Vertigo    last episode over 1 yr ago    Patient Active Problem List   Diagnosis Date Noted  . History of colon polyps 05/13/2018  . Health care maintenance   . Benign neoplasm of cecum   . Benign neoplasm of transverse colon   . Columnar epithelial-lined lower esophagus   . Gastrointestinal ulcer   . Hot flashes 01/12/2018  . Chronic venous insufficiency 12/18/2017  . Lymphedema 12/18/2017  . Iron deficiency anemia following bariatric surgery 10/28/2017  . Bilateral lower extremity edema 10/07/2017  . Hyperglycemia 03/23/2017  . Rotator cuff syndrome 04/24/2015  . Arthritis of knee, degenerative 04/24/2015  . Degenerative arthritis of hip 04/24/2015  . B12 deficiency 01/06/2015  . Primary osteoarthritis of knee 12/01/2014  . Anemia 11/22/2014  . Primary localized osteoarthritis of left hip 09/27/2014  . Gout 03/20/2014  . Excessive and frequent menstruation 08/10/2013  . Derangement of posterior horn of medial meniscus 08/10/2013  . Mitral valve disease 08/10/2013  . Edema 12/30/2012  . Essential hypertension, benign 08/02/2012  . Melanoma in situ ([place]) 08/02/2012  . Mild depression ([place]) 08/02/2012  . Environmental allergies 08/02/2012  . Hypercholesterolemia 08/02/2012  . Migraines 08/02/2012  . Hypothyroidism 08/02/2012  . PCOS (polycystic ovarian syndrome) 08/02/2012  .  GERD (gastroesophageal reflux disease) 08/02/2012  . H/O gastric bypass 08/02/2012  . Food allergy 07/19/2011    Past Surgical History:  Procedure Laterality Date  . BREAST BIOPSY Right 2007   neg- bx/clip  . CHOLECYSTECTOMY  2003  . COLONOSCOPY WITH PROPOFOL N/A 05/11/2018   Procedure: COLONOSCOPY WITH PROPOFOL;  Surgeon: [name], MD;  Location: [facility] ENDOSCOPY;  Service: Endoscopy;  Laterality: N/A;  . DILITATION & CURRETTAGE/HYSTROSCOPY WITH NOVASURE ABLATION N/A 08/19/2016   Procedure: DILATATION & CURETTAGE/HYSTEROSCOPY WITH NOVASURE ABLATION;  Surgeon:  [name], MD;  Location: [facility] ORS;  Service: Gynecology;  Laterality: N/A;  . ESOPHAGOGASTRODUODENOSCOPY (EGD) WITH PROPOFOL N/A 12/05/2015   Procedure: ESOPHAGOGASTRODUODENOSCOPY (EGD) WITH PROPOFOL;  Surgeon: Lollie Sails, MD;  Location: [facility] ENDOSCOPY;  Service: Endoscopy;  Laterality: N/A;  . ESOPHAGOGASTRODUODENOSCOPY (EGD) WITH PROPOFOL N/A 02/06/2016   Procedure: ESOPHAGOGASTRODUODENOSCOPY (EGD) WITH PROPOFOL;  Surgeon: Lollie Sails, MD;  Location: [facility] ENDOSCOPY;  Service: Endoscopy;  Laterality: N/A;  . ESOPHAGOGASTRODUODENOSCOPY (EGD) WITH PROPOFOL N/A 05/11/2018   Procedure: ESOPHAGOGASTRODUODENOSCOPY (EGD) WITH PROPOFOL;  Surgeon: [name], MD;  Location: [facility] ENDOSCOPY;  Service: Endoscopy;  Laterality: N/A;  . FOOT SURGERY  1994  . GASTRIC BYPASS  2011  . JOINT REPLACEMENT Left 09/27/2014   HIP  . JOINT REPLACEMENT Left 12/01/2014   KNEE  . KNEE ARTHROSCOPY Right   . KNEE ARTHROSCOPY Left 03/23/2015   Procedure: ARTHROSCOPY KNEE, partial synovectomy;  Surgeon: Hessie Knows, MD;  Location: [facility] ORS;  Service: Orthopedics;  Laterality: Left;  . LAPAROSCOPIC GASTRIC BANDING  2010  . ROTATOR CUFF REPAIR Bilateral    right shoulder 07-26-2016  . SHOULDER ARTHROSCOPY Left 06/20/2015   Procedure: ARTHROSCOPY SHOULDER, REPAIR OF MASSIVE ROTATOR CUFF TEAR, TENODESIS, DECOMPRESSION DEBRIDEMENT;  Surgeon: Corky Mull, MD;  Location: [facility] ORS;  Service: Orthopedics;  Laterality: Left;  . TOTAL HIP ARTHROPLASTY Left 09/27/2014   Procedure: TOTAL HIP ARTHROPLASTY ANTERIOR APPROACH;  Surgeon: Hessie Knows, MD;  Location: [facility] ORS;  Service: Orthopedics;  Laterality: Left;  . TOTAL KNEE ARTHROPLASTY Left 12/01/2014   Procedure: TOTAL KNEE ARTHROPLASTY;  Surgeon: Hessie Knows, MD;  Location: [facility] ORS;  Service: Orthopedics;  Laterality: Left;    Prior to Admission medications   Medication Sig Start Date End Date Taking? Authorizing Provider  acetaminophen (TYLENOL) 650 MG  CR tablet Take 1,300 mg by mouth 2 (two) times daily.    [provider]  allopurinol (ZYLOPRIM) 300 MG tablet TAKE 1 TABLET BY MOUTH EVERY DAY 04/22/18   Einar Pheasant, MD  cyanocobalamin (,VITAMIN B-12,) 1000 MCG/ML injection ADM 1 ML IM Q 30 DAYS 08/07/18   [provider]  Dupilumab ([place]) 300 MG/2ML SOSY  08/12/18   [provider]  furosemide (LASIX) 40 MG tablet Take 1 tablet (40 mg total) by mouth daily. 07/11/18   Einar Pheasant, MD  gabapentin (NEURONTIN) 100 MG capsule Take 200 mg by mouth 2 (two) times daily. 04/20/18   [provider]  levothyroxine (SYNTHROID) 137 MCG tablet TK 1 T PO QD 30 TO 60 MIN B BRE OES AND WITH A GLASS OF WATER 06/19/18   [provider]  metFORMIN (GLUCOPHAGE) 500 MG tablet Take 1 tablet (500 mg total) by mouth daily with breakfast. 07/16/18   Eber Jones, MD  metolazone (ZAROXOLYN) 5 MG tablet Take 5 mg by mouth daily.    [provider]  montelukast (SINGULAIR) 10 MG tablet Take 10 mg by mouth at bedtime.    [provider]  pantoprazole (PROTONIX) 40 MG tablet Take 1 tablet (40 mg total) by mouth 2 (two) times daily before a meal. 07/28/18   [name], MD  potassium chloride SA (K-DUR,KLOR-CON) 20 MEQ tablet Take two tablets (40 meq) by mouth daily in the morning and 1 tablet (20 meq) by mouth daily in the evening 07/13/18   Einar Pheasant, MD  sertraline (ZOLOFT) 50 MG tablet Take 1.5 tablets (75 mg total) by mouth daily. 08/17/18 08/17/19  Kathlee Nations, MD  sulfamethoxazole-trimethoprim (BACTRIM DS) 800-160 MG tablet  08/22/18   [provider]  triamcinolone (KENALOG) 0.025 % cream APP EXT AA BID 08/17/18   [provider]  triamterene-hydrochlorothiazide (MAXZIDE-25) 37.5-25 MG tablet TAKE 1 TABLET BY MOUTH EVERY DAY 07/09/18   Einar Pheasant, MD  Vitamin D, Ergocalciferol, (DRISDOL) 1.25 MG (50000 UT) CAPS capsule Take 1 capsule (50,000 Units total) by mouth  every 7 (seven) days. Patient not taking: Reported on 08/25/2018 07/02/18   Eber Jones, MD    Allergies  Allergen Reactions  . Contrast Media [Iodinated Diagnostic Agents] Anaphylaxis    MRI dye  . Etodolac Anaphylaxis  . Influenza Vaccines Hives  . Lactose Nausea And Vomiting    Can have yogurt Can not have milk and ice cream   . Lactose Intolerance (Gi) Nausea And Vomiting    Can have yogurt Can not have milk and ice cream    . Bacitracin-Neomycin-Polymyxin Rash  . Neomycin-Bacitracin Zn-Polymyx Rash  . Neosporin + Pain Relief Max St [Neomy-Bacit-Polymyx-Pramoxine] Rash    Family History  Problem Relation Age of Onset  . Arthritis Mother   . Hyperlipidemia Mother   . Hypertension Mother   . Diabetes Mother   . Sleep apnea Mother   . Obesity Mother   . Arthritis Father   . Hyperlipidemia Father   . Hypertension Father   . Mental illness Father   . Diabetes Father   . Obesity Father   . Sleep apnea Father   . Bipolar disorder Father   . Anxiety disorder Father   . Depression Father   . Cancer Father   . Kidney disease Father   . Arthritis Maternal Grandmother   . Cancer Maternal Grandmother        breast cancer  . Hyperlipidemia Maternal Grandmother   . Hypertension Maternal Grandmother   . Breast cancer Maternal Grandmother   . Arthritis Maternal Grandfather   . Hyperlipidemia Maternal Grandfather   . Hypertension Maternal Grandfather   . Heart disease Maternal Grandfather        heart attack  . Breast cancer Paternal Grandmother     Social History Social History   Tobacco Use  . Smoking status: Never Smoker  . Smokeless tobacco: Never Used  Substance Use Topics  . Alcohol use: Yes    Alcohol/week: 0.0 - 1.0 standard drinks    Comment: occ.  . Drug use: No    Review of Systems Constitutional: Negative for fever. Cardiovascular: Negative for chest pain. Respiratory: Negative for shortness of breath. Gastrointestinal: Mild left lower  quadrant abdominal discomfort earlier.  Negative for nausea vomiting or diarrhea. Genitourinary: Mild burning upon urination Musculoskeletal: Negative for musculoskeletal complaints Skin: Negative for skin complaints  Neurological: Negative for headache All other ROS negative  ____________________________________________   PHYSICAL EXAM:  VITAL SIGNS: ED Triage Vitals  Enc Vitals Group     BP 08/25/18 1711 115/65     Pulse Rate 08/25/18 1711 75     Resp  08/25/18 1711 17     Temp 08/25/18 1711 98.7 F (37.1 C)     Temp Source 08/25/18 1711 Oral     SpO2 08/25/18 1711 97 %     Weight 08/25/18 1712 225 lb (102.1 kg)     Height 08/25/18 1712 5\' 7"  (1.702 m)     Head Circumference --      Peak Flow --      Pain Score 08/25/18 1712 0     Pain Loc --      Pain Edu? --      Excl. in [place]? --    Constitutional: Alert and oriented. Well appearing and in no distress. Eyes: Normal exam ENT      Head: Normocephalic and atraumatic.      Mouth/Throat: Mucous membranes are moist. Cardiovascular: Normal rate, regular rhythm.  Respiratory: Normal respiratory effort without tachypnea nor retractions. Breath sounds are clear  Gastrointestinal: Soft and nontender. No distention.  Overall benign abdominal exam. Musculoskeletal: Nontender with normal range of motion in all extremities. Neurologic:  Normal speech and language. No gross focal neurologic deficits Skin:  Skin is warm, dry and intact.  Psychiatric: Mood and affect are normal.  ____________________________________________     RADIOLOGY  CT shows constipation otherwise largely negative  ____________________________________________   INITIAL IMPRESSION / ASSESSMENT AND PLAN / ED COURSE  Pertinent labs & imaging results that were available during my care of the patient were reviewed by me and considered in my medical decision making (see chart for details).   Patient presents to the emergency department for intermittent  dysuria x1 to 2 months and now with left lower quadrant abdominal discomfort.  Patient has no significant tenderness on my examination largely benign abdominal exam.  Patient's lab work is largely within normal limits of size greater than 50 white blood cells on urinalysis.  We will obtain a CT renal scan to further evaluate to rule out ureterolithiasis, evaluate for possible pyelonephritis, colitis or diverticulitis.  Overall patient appears extremely well.  If CT negative we will treat for her urinary tract infection.  I have added a urine culture onto the patient's urinalysis.  Patient describes the discomfort is very mild dull pain across the lower abdomen.  CT shows constipation otherwise largely negative.  We will cover with antibiotics to cover for the urinary tract infection, discharged on MiraLAX have the patient follow-up with her PCP.  Patient agreeable to plan of care.  Urine cultures pending.  Lori Le was evaluated in Emergency Department on 08/25/2018 for the symptoms described in the history of present illness. She was evaluated in the context of the global COVID-19 pandemic, which necessitated consideration that the patient might be at risk for infection with the SARS-CoV-2 virus that causes COVID-19. Institutional protocols and algorithms that pertain to the evaluation of patients at risk for COVID-19 are in a state of rapid change based on information released by regulatory bodies including the CDC and federal and state organizations. These policies and algorithms were followed during the patient's care in the ED.  ____________________________________________   FINAL CLINICAL IMPRESSION(S) / ED DIAGNOSES  Lower abdominal pain Urinary tract infection   Harvest Dark, MD 08/25/18 1952

## 2018-08-25 NOTE — Progress Notes (Signed)
Patient ID: Lori Le DOB: [birth year] AGE: 53 y.o. MRN: [id number]   PCP: Einar Pheasant, MD   Chief Complaint:  Chief Complaint  Patient presents with  . Urinary Tract Infection    x4d     Subjective:    HPI:  Lori Le is a 53 y.o. female presents for evaluation  Chief Complaint  Patient presents with  . Urinary Tract Infection    x5d    53 year old female presents with three month history of recurrent UTI issues. Reports dysuria, increased urinary urgency with then scant urination (will only urinate a few drops), and suprapubic pressure/discomfort. Completed MD Live visit in early March 2020. Was prescribed Macrobid. Symptoms resolved. Then returned within a few weeks. Completed another MD Live visit, believes she was prescribed Bactrim. Symptoms resolved. Symptoms returned this past Friday, 08/21/2018, four days ago. Sudden onset. Severe. Stabbing pain with urination, primarily at end of urinary stream. Cloudy urine with dark yellow color. Completed another MD Live visit, same day as onset of symptoms, prescribed Bactrim again. Took twice prescribed dosage (two pills instead of one). No improvement. Has not taken any pain medication. Reports associated constipation sensation. Denies fever, chills, sweats, body aches, nausea/vomiting, abdominal cramping, flank/back pain, vaginal discharge/bleeding, urinary incontinence, gross hematuria, melena or bright red blood per rectum. Patient with baseline increased urinary frequency, due to daily diuretic (Lasix).  Patient with previous history nephrolithiasis. Early 2000s. Presented with colicky flank pain; current symptoms feel different.  Patient with new sexual partner. First UTI prior to being sexually active with new partner. Patient underwent ablation in 2018.  Patient underwent colonoscopy on 05/11/2018. States she was told no abnormality found.   A limited review of symptoms was performed, pertinent positives and negatives  as mentioned in HPI.  The following portions of the patient's history were reviewed and updated as appropriate: allergies, current medications and past medical history.  Patient Active Problem List   Diagnosis Date Noted  . History of colon polyps 05/13/2018  . Health care maintenance   . Benign neoplasm of cecum   . Benign neoplasm of transverse colon   . Columnar epithelial-lined lower esophagus   . Gastrointestinal ulcer   . Hot flashes 01/12/2018  . Chronic venous insufficiency 12/18/2017  . Lymphedema 12/18/2017  . Iron deficiency anemia following bariatric surgery 10/28/2017  . Bilateral lower extremity edema 10/07/2017  . Hyperglycemia 03/23/2017  . Rotator cuff syndrome 04/24/2015  . Arthritis of knee, degenerative 04/24/2015  . Degenerative arthritis of hip 04/24/2015  . B12 deficiency 01/06/2015  . Primary osteoarthritis of knee 12/01/2014  . Anemia 11/22/2014  . Primary localized osteoarthritis of left hip 09/27/2014  . Gout 03/20/2014  . Excessive and frequent menstruation 08/10/2013  . Derangement of posterior horn of medial meniscus 08/10/2013  . Mitral valve disease 08/10/2013  . Edema 12/30/2012  . Essential hypertension, benign 08/02/2012  . Melanoma in situ ([place]) 08/02/2012  . Mild depression ([place]) 08/02/2012  . Environmental allergies 08/02/2012  . Hypercholesterolemia 08/02/2012  . Migraines 08/02/2012  . Hypothyroidism 08/02/2012  . PCOS (polycystic ovarian syndrome) 08/02/2012  . GERD (gastroesophageal reflux disease) 08/02/2012  . H/O gastric bypass 08/02/2012  . Food allergy 07/19/2011    Allergies  Allergen Reactions  . Contrast Media [Iodinated Diagnostic Agents] Anaphylaxis    MRI dye  . Etodolac Anaphylaxis  . Influenza Vaccines Hives  . Lactose Nausea And Vomiting    Can have yogurt Can not have milk and ice cream   .  Lactose Intolerance ([place]) Nausea And Vomiting    Can have yogurt Can not have milk and ice cream    .  Bacitracin-Neomycin-Polymyxin Rash  . Neomycin-Bacitracin Zn-Polymyx Rash  . Neosporin + Pain Relief Max St [Neomy-Bacit-Polymyx-Pramoxine] Rash    Current Outpatient Medications on File Prior to Visit  Medication Sig Dispense Refill  . allopurinol (ZYLOPRIM) 300 MG tablet TAKE 1 TABLET BY MOUTH EVERY DAY 90 tablet 2  . cyanocobalamin (,VITAMIN B-12,) 1000 MCG/ML injection ADM 1 ML IM Q 30 DAYS    . Dupilumab (DUPIXENT) 300 MG/2ML SOSY     . furosemide (LASIX) 40 MG tablet Take 1 tablet (40 mg total) by mouth daily. 90 tablet 0  . gabapentin (NEURONTIN) 100 MG capsule Take 200 mg by mouth 2 (two) times daily.    [name] levothyroxine (SYNTHROID) 137 MCG tablet TK 1 T PO QD 30 TO 60 MIN B BRE OES AND WITH A GLASS OF WATER    . metFORMIN (GLUCOPHAGE) 500 MG tablet Take 1 tablet (500 mg total) by mouth daily with breakfast. 30 tablet 0  . montelukast (SINGULAIR) 10 MG tablet Take 10 mg by mouth at bedtime.    . pantoprazole (PROTONIX) 40 MG tablet Take 1 tablet (40 mg total) by mouth 2 (two) times daily before a meal. 180 tablet 0  . potassium chloride SA (K-DUR,KLOR-CON) 20 MEQ tablet Take two tablets (40 meq) by mouth daily in the morning and 1 tablet (20 meq) by mouth daily in the evening 90 tablet 0  . sertraline (ZOLOFT) 50 MG tablet Take 1.5 tablets (75 mg total) by mouth daily. 135 tablet 0  . sulfamethoxazole-trimethoprim (BACTRIM DS) 800-160 MG tablet     . triamcinolone (KENALOG) 0.025 % cream APP EXT AA BID    . triamterene-hydrochlorothiazide (MAXZIDE-25) 37.5-25 MG tablet TAKE 1 TABLET BY MOUTH EVERY DAY 90 tablet 0  . acetaminophen (TYLENOL) 650 MG CR tablet Take 1,300 mg by mouth 2 (two) times daily.    . metolazone (ZAROXOLYN) 5 MG tablet Take 5 mg by mouth daily.    . Vitamin D, Ergocalciferol, (DRISDOL) 1.25 MG (50000 UT) CAPS capsule Take 1 capsule (50,000 Units total) by mouth every 7 (seven) days. (Patient not taking: Reported on 08/25/2018) 4 capsule 0   Current  Facility-Administered Medications on File Prior to Visit  Medication Dose Route Frequency Provider Last Rate Last Dose  . 0.9 %  sodium chloride infusion   Intravenous Continuous Lequita Asal, MD 0 mL/hr at 05/08/17 1437    . cyanocobalamin ((VITAMIN B-12)) injection 1,000 mcg  1,000 mcg Intramuscular Once Lequita Asal, MD           Objective:   Vitals:   08/25/18 1535  BP: 118/74  Pulse: 86  Resp: 16  Temp: 98.4 F (36.9 C)  SpO2: 97%     Wt Readings from Last 3 Encounters:  08/25/18 225 lb (102.1 kg)  06/17/18 222 lb (100.7 kg)  06/04/18 229 lb (103.9 kg)    Physical Exam:   General Appearance:  Patient sitting comfortably on examination table. Conversational. Kermit Balo self-historian. In no acute distress. Afebrile.   Head:  Normocephalic, without obvious abnormality, atraumatic  Eyes:  PERRL, conjunctiva/corneas clear, EOM's intact  Neck: Supple, symmetrical, trachea midline, no adenopathy  Lungs:   Clear to auscultation bilaterally, respirations unlabored. Good aeration. No rales, rhonchi, crackles or wheezing.  Heart:  Regular rate and rhythm  Abdomen:   Normal to inspection. Normoactive bowel sounds. No CVA tenderness  with percussion bilaterally. Moderate tenderness with palpation in left lower quadrant and left periumbilical area. Mild guarding. Rebound tenderness. No rigidity. No palpable mass. No pain with ambulation.  Extremities: Extremities normal, atraumatic, no cyanosis or edema  Pulses: 2+ and symmetric  Skin: Skin color, texture, turgor normal, no rashes or lesions  Lymph nodes: Cervical, supraclavicular, and axillary nodes normal  Neurologic: Normal    Assessment & Plan:    Exam findings, diagnosis etiology and medication use and indications reviewed with patient. Follow-Up and discharge instructions provided. No emergent/urgent issues found on exam.  Patient education was provided.   Patient verbalized understanding of information provided  and agrees with plan of care (POC), all questions answered. The patient is advised to call or return to clinic if condition does not see an improvement in symptoms, or to seek the care of the closest emergency department if condition worsens with the below plan.    Urinalysis performed in office today revealed No blood, bilirubin, ketones, protein, nitrites, glucose, or leukocytes. PH 5.5. S.G 1.015. Color light yellow. Clear.   1. Left lower quadrant abdominal tenderness with rebound tenderness  2. Dysuria - POCT Urinalysis Dipstick  53 year old female [facility] with three month history of on&off UTI symptoms. Has been treated with three different courses of antibiotics; Bactrim x2 and Macrobid. No previous UA. No urine culture. Has not undergone STD testing with new sexual partner.  Patient during physical exam was surprised by moderate LLQ abdominal tenderness. Differential includes nephrolithiasis/ureteral stone, UTI, PID, diverticulitis, etc.  Patient's VSS, afebrile, in no acute distress - however, UA not indicating UTI. Abdominal exam concerning for acute abdomen. Believe patient should have further work-up at urgent care or ED. UA with urine culture warranted. Imaging warranted; possibly CT scan of abdomen. Patient agrees with plan. Patient to go directly to [facility] ED (will go via private vehicle).  [place] ED. Gave report to charge nurse.   Darlin Priestly, MHS, PA-C Montey Hora, MHS, PA-C Advanced Practice Provider [facility]  [place], [place] [address] (p): [phone] [email or web address]

## 2018-08-26 ENCOUNTER — Encounter (INDEPENDENT_AMBULATORY_CARE_PROVIDER_SITE_OTHER): Payer: Self-pay | Admitting: Family Medicine

## 2018-08-26 ENCOUNTER — Ambulatory Visit (INDEPENDENT_AMBULATORY_CARE_PROVIDER_SITE_OTHER): Payer: Managed Care, Other (non HMO) | Admitting: Family Medicine

## 2018-08-26 ENCOUNTER — Telehealth: Payer: Self-pay

## 2018-08-26 DIAGNOSIS — Z6834 Body mass index (BMI) 34.0-34.9, adult: Secondary | ICD-10-CM | POA: Diagnosis not present

## 2018-08-26 DIAGNOSIS — E669 Obesity, unspecified: Secondary | ICD-10-CM

## 2018-08-26 DIAGNOSIS — E559 Vitamin D deficiency, unspecified: Secondary | ICD-10-CM

## 2018-08-26 DIAGNOSIS — R7303 Prediabetes: Secondary | ICD-10-CM

## 2018-08-26 MED ORDER — METFORMIN HCL 500 MG PO TABS: 500.0000 mg | ORAL_TABLET | Freq: Every day | ORAL | 0 refills | Status: DC

## 2018-08-26 NOTE — Telephone Encounter (Signed)
Informed patient of low potassium levels. Patient reports PCP is aware and recently increased her Potassium to 20 MEQ 2 tablets in AM and 1 tablet in PM. Patient denies any further questions.

## 2018-08-26 NOTE — Telephone Encounter (Signed)
-----   Message from Lequita Asal, MD sent at 08/25/2018  4:19 PM EDT ----- Regarding: Please call patient and PCP  Potassium is 3.0.  She is taking Lasix provided by her PCP.  She needs to start on oral potassium.  We follow her for iron deficiency.  M ----- Message ----- From: Buel Ream, Lab In [place] Sent: 08/25/2018   2:48 PM EDT To: Lequita Asal, MD

## 2018-08-26 NOTE — Telephone Encounter (Signed)
Left VM requesting callback from patient. (This is regarding low potassium levels)   Labs sent to PCP.

## 2018-08-27 ENCOUNTER — Ambulatory Visit: Payer: Managed Care, Other (non HMO) | Admitting: Gastroenterology

## 2018-08-27 ENCOUNTER — Other Ambulatory Visit: Payer: Self-pay | Admitting: Podiatry

## 2018-08-27 ENCOUNTER — Telehealth: Payer: Self-pay

## 2018-08-27 ENCOUNTER — Encounter: Payer: Self-pay | Admitting: *Deleted

## 2018-08-27 ENCOUNTER — Other Ambulatory Visit: Payer: Self-pay

## 2018-08-27 LAB — URINE CULTURE: Culture: 70000 — AB

## 2018-08-27 NOTE — Telephone Encounter (Signed)
Patient wanted to speak with the provider about fecal impaction and the medications she was taking for. She spoke with the provider which gave her some advices.

## 2018-08-27 NOTE — Progress Notes (Signed)
Office: [phone]  /  Fax: [phone] TeleHealth Visit:  SHAE HINNENKAMP has verbally consented to this TeleHealth visit today. The patient is located at home, the provider is located at the [facility] office. The participants in this visit include the listed provider and patient. The visit was conducted today via webex.  HPI:   Chief Complaint: OBESITY Lori Le is here to discuss her progress with her obesity treatment plan. She is on the keep a food journal with 1150-1300 calories and 80+ grams of protein daily or follow the Category 3 plan and is following her eating plan approximately 90 % of the time. She states she is exercising 0 minutes 0 times per week. Kandi finally finished another 6 day Prednisone pack, and is still experiencing a rash. She is struggling with recurrent UTI and stool inpaction. Her weight is of 224 lbs today. She voices that she has struggled with journaling secondary to lack of motivation and increase in frustration with health issues. She states her blood pressure was 117/65 at her doctor's office. We were unable to weigh the patient today for this TeleHealth visit. She feels as if she has lost 2 lbs since her last visit. She has lost 2 lbs since starting treatment with [name].  Pre-Diabetes Denai has a diagnosis of pre-diabetes based on her elevated Hgb A1c of 5.8 and insulin of 12.2. She was informed this puts her at greater risk of developing diabetes. She denies GI side effects of metformin and continues to work on diet and exercise to decrease risk of diabetes. She denies hypoglycemia.  Vitamin D Deficiency Maanasa has a diagnosis of vitamin D deficiency. She is currently taking prescription Vit D. She notes fatigue and denies nausea, vomiting or muscle weakness.  ASSESSMENT AND PLAN:  Prediabetes - Plan: metFORMIN (GLUCOPHAGE) 500 MG tablet  Vitamin D deficiency  Class 1 obesity with serious comorbidity and body mass index (BMI) of 34.0 to 34.9 in  adult, unspecified obesity type  PLAN:  Pre-Diabetes Rhianna will continue to work on weight loss, exercise, and decreasing simple carbohydrates in her diet to help decrease the risk of diabetes. We dicussed metformin including benefits and risks. She was informed that eating too many simple carbohydrates or too many calories at one sitting increases the likelihood of GI side effects. Calise agrees to continue taking metformin 500 mg PO daily #30 and we will refill for 1 month. Wallis agrees to follow up with our clinic in 2 weeks as directed to monitor her progress.  Vitamin D Deficiency Krishika was informed that low vitamin D levels contributes to fatigue and are associated with obesity, breast, and colon cancer. Lynett agrees to continue taking prescription Vit D @50 ,000 IU every week, no refill needed. She will follow up for routine testing of vitamin D, at least 2-3 times per year. She was informed of the risk of over-replacement of vitamin D and agrees to not increase her dose unless she discusses this with [name] first. Zakyia agrees to follow up with our clinic in 2 weeks.  Obesity Rylann is currently in the action stage of change. As such, her goal is to continue with weight loss efforts She has agreed to keep a food journal with 1150-1300 calories and 80+ grams of protein daily Elesha has been instructed to work up to a goal of 150 minutes of combined cardio and strengthening exercise per week for weight loss and overall health benefits. We discussed the following Behavioral Modification Strategies today: increasing  lean protein intake, increasing vegetables and work on meal planning and easy cooking plans, keeping healthy foods in the home, and planning for success   Emilija has agreed to follow up with our clinic in 2 weeks. She was informed of the importance of frequent follow up visits to maximize her success with intensive lifestyle modifications for her multiple health conditions.  ALLERGIES: Allergies   Allergen Reactions  . Contrast Media [Iodinated Diagnostic Agents] Anaphylaxis    MRI dye  . Etodolac Anaphylaxis  . Influenza Vaccines Hives  . Lactose Nausea And Vomiting    Can have yogurt Can not have milk and ice cream   . Lactose Intolerance (Gi) Nausea And Vomiting    Can have yogurt Can not have milk and ice cream    . Bacitracin-Neomycin-Polymyxin Rash  . Neomycin-Bacitracin Zn-Polymyx Rash  . Neosporin + Pain Relief Max St [Neomy-Bacit-Polymyx-Pramoxine] Rash    MEDICATIONS: Current Outpatient Medications on File Prior to Visit  Medication Sig Dispense Refill  . acetaminophen (TYLENOL) 650 MG CR tablet Take 1,300 mg by mouth 2 (two) times daily.    [name] allopurinol (ZYLOPRIM) 300 MG tablet TAKE 1 TABLET BY MOUTH EVERY DAY 90 tablet 2  . cephALEXin (KEFLEX) 500 MG capsule Take 1 capsule (500 mg total) by mouth 3 (three) times daily. 21 capsule 0  . cyanocobalamin (,VITAMIN B-12,) 1000 MCG/ML injection ADM 1 ML IM Q 30 DAYS    . Dupilumab (DUPIXENT) 300 MG/2ML SOSY     . furosemide (LASIX) 40 MG tablet Take 1 tablet (40 mg total) by mouth daily. 90 tablet 0  . gabapentin (NEURONTIN) 100 MG capsule Take 200 mg by mouth 2 (two) times daily.    [name] levothyroxine (SYNTHROID) 137 MCG tablet TK 1 T PO QD 30 TO 60 MIN B BRE OES AND WITH A GLASS OF WATER    . metolazone (ZAROXOLYN) 5 MG tablet Take 5 mg by mouth daily.    . montelukast (SINGULAIR) 10 MG tablet Take 10 mg by mouth at bedtime.    . pantoprazole (PROTONIX) 40 MG tablet Take 1 tablet (40 mg total) by mouth 2 (two) times daily before a meal. 180 tablet 0  . polyethylene glycol powder (GLYCOLAX/MIRALAX) 17 GM/SCOOP powder Take 17 g by mouth daily as needed for moderate constipation. 255 g 0  . potassium chloride SA (K-DUR,KLOR-CON) 20 MEQ tablet Take two tablets (40 meq) by mouth daily in the morning and 1 tablet (20 meq) by mouth daily in the evening 90 tablet 0  . sertraline (ZOLOFT) 50 MG tablet Take 1.5 tablets (75 mg  total) by mouth daily. 135 tablet 0  . sulfamethoxazole-trimethoprim (BACTRIM DS) 800-160 MG tablet     . triamcinolone (KENALOG) 0.025 % cream APP EXT AA BID    . triamterene-hydrochlorothiazide (MAXZIDE-25) 37.5-25 MG tablet TAKE 1 TABLET BY MOUTH EVERY DAY 90 tablet 0  . Vitamin D, Ergocalciferol, (DRISDOL) 1.25 MG (50000 UT) CAPS capsule Take 1 capsule (50,000 Units total) by mouth every 7 (seven) days. (Patient not taking: Reported on 08/25/2018) 4 capsule 0   Current Facility-Administered Medications on File Prior to Visit  Medication Dose Route Frequency Provider Last Rate Last Dose  . 0.9 %  sodium chloride infusion   Intravenous Continuous Lequita Asal, MD 0 mL/hr at 05/08/17 1437    . cyanocobalamin ((VITAMIN B-12)) injection 1,000 mcg  1,000 mcg Intramuscular Once Lequita Asal, MD        PAST MEDICAL HISTORY: Past Medical History:  Diagnosis Date  . Allergic rhinitis due to allergen   . Allergy   . Anemia   . Anxiety   . Arthritis   . B12 deficiency   . Cancer ([place])    skin ca  . Chronic idiopathic urticaria   . Complication of anesthesia    nausea, slow to wake up  . Depression   . Edema   . Gastric ulcer   . GERD (gastroesophageal reflux disease)   . Gout   . Gout   . Hashimoto's thyroiditis   . Heart murmur   . History of eating disorder   . Hives   . Hyperlipidemia   . Hypertension   . Hypothyroidism   . Lower extremity edema   . Melanoma in situ ([place])    left shoulder  . Migraine   . Mitral valve disorder   . Motion sickness    all moving vehicles  . Neuropathy   . PCOS (polycystic ovarian syndrome)   . PONV (postoperative nausea and vomiting)   . Thyroid disease   . Ulcer   . Vertigo    last episode over 1 yr ago    PAST SURGICAL HISTORY: Past Surgical History:  Procedure Laterality Date  . BREAST BIOPSY Right 2007   neg- bx/clip  . CHOLECYSTECTOMY  2003  . COLONOSCOPY WITH PROPOFOL N/A 05/11/2018   Procedure: COLONOSCOPY WITH  PROPOFOL;  Surgeon: [name], MD;  Location: [facility] ENDOSCOPY;  Service: Endoscopy;  Laterality: N/A;  . DILITATION & CURRETTAGE/HYSTROSCOPY WITH NOVASURE ABLATION N/A 08/19/2016   Procedure: DILATATION & CURETTAGE/HYSTEROSCOPY WITH NOVASURE ABLATION;  Surgeon: [name], MD;  Location: [facility] ORS;  Service: Gynecology;  Laterality: N/A;  . ESOPHAGOGASTRODUODENOSCOPY (EGD) WITH PROPOFOL N/A 12/05/2015   Procedure: ESOPHAGOGASTRODUODENOSCOPY (EGD) WITH PROPOFOL;  Surgeon: Lollie Sails, MD;  Location[facility]COPY;  Service: Endoscopy;  Laterality: N/A;  . ESOPHAGOGASTRODUODENOSCOPY (EGD) WITH PROPOFOL N/A 02/06/2016   Procedure: ESOPHAGOGASTRODUODENOSCOPY (EGD) WITH PROPOFOL;  Surgeon: Lollie Sails, MD;  Location: [facility] ENDOSCOPY;  Service: Endoscopy;  Laterality: N/A;  . ESOPHAGOGASTRODUODENOSCOPY (EGD) WITH PROPOFOL N/A 05/11/2018   Procedure: ESOPHAGOGASTRODUODENOSCOPY (EGD) WITH PROPOFOL;  Surgeon: [name], MD;  Location: [facility] ENDOSCOPY;  Service: Endoscopy;  Laterality: N/A;  . FOOT SURGERY  1994  . GASTRIC BYPASS  2011  . JOINT REPLACEMENT Left 09/27/2014   HIP  . JOINT REPLACEMENT Left 12/01/2014   KNEE  . KNEE ARTHROSCOPY Right   . KNEE ARTHROSCOPY Left 03/23/2015   Procedure: ARTHROSCOPY KNEE, partial synovectomy;  Surgeon: Hessie Knows, MD;  Location: [facility] ORS;  Service: Orthopedics;  Laterality: Left;  . LAPAROSCOPIC GASTRIC BANDING  2010  . ROTATOR CUFF REPAIR Bilateral    right shoulder 07-26-2016  . SHOULDER ARTHROSCOPY Left 06/20/2015   Procedure: ARTHROSCOPY SHOULDER, REPAIR OF MASSIVE ROTATOR CUFF TEAR, TENODESIS, DECOMPRESSION DEBRIDEMENT;  Surgeon: Corky Mull, MD;  Location: [facility] ORS;  Service: Orthopedics;  Laterality: Left;  . TOTAL HIP ARTHROPLASTY Left 09/27/2014   Procedure: TOTAL HIP ARTHROPLASTY ANTERIOR APPROACH;  Surgeon: Hessie Knows, MD;  Location: [facility] ORS;  Service: Orthopedics;  Laterality: Left;  . TOTAL KNEE ARTHROPLASTY Left  12/01/2014   Procedure: TOTAL KNEE ARTHROPLASTY;  Surgeon: Hessie Knows, MD;  Location: [facility] ORS;  Service: Orthopedics;  Laterality: Left;    SOCIAL HISTORY: Social History   Tobacco Use  . Smoking status: Never Smoker  . Smokeless tobacco: Never Used  Substance Use Topics  . Alcohol use: Yes    Alcohol/week: 0.0 - 1.0 standard  drinks    Comment: occ.  . Drug use: No    FAMILY HISTORY: Family History  Problem Relation Age of Onset  . Arthritis Mother   . Hyperlipidemia Mother   . Hypertension Mother   . Diabetes Mother   . Sleep apnea Mother   . Obesity Mother   . Arthritis Father   . Hyperlipidemia Father   . Hypertension Father   . Mental illness Father   . Diabetes Father   . Obesity Father   . Sleep apnea Father   . Bipolar disorder Father   . Anxiety disorder Father   . Depression Father   . Cancer Father   . Kidney disease Father   . Arthritis Maternal Grandmother   . Cancer Maternal Grandmother        breast cancer  . Hyperlipidemia Maternal Grandmother   . Hypertension Maternal Grandmother   . Breast cancer Maternal Grandmother   . Arthritis Maternal Grandfather   . Hyperlipidemia Maternal Grandfather   . Hypertension Maternal Grandfather   . Heart disease Maternal Grandfather        heart attack  . Breast cancer Paternal Grandmother     ROS: Review of Systems  Constitutional: Positive for malaise/fatigue and weight loss.  Gastrointestinal: Negative for nausea and vomiting.  Musculoskeletal:       Negative muscle weakness  Skin: Positive for rash.  Endo/Heme/Allergies:       Negative hypoglycemia    PHYSICAL EXAM: Pt in no acute distress  RECENT LABS AND TESTS: BMET    Component Value Date/Time   NA 137 08/25/2018 1716   NA 139 08/10/2018 1012   K 3.2 (L) 08/25/2018 1716   K 4.0 09/27/2014   CL 93 (L) 08/25/2018 1716   CO2 32 08/25/2018 1716   GLUCOSE 102 (H) 08/25/2018 1716   BUN 19 08/25/2018 1716   BUN 11 08/10/2018 1012    CREATININE 0.93 08/25/2018 1716   CREATININE 0.81 09/12/2017 1529   CALCIUM 8.9 08/25/2018 1716   GFRNONAA >60 08/25/2018 1716   GFRAA >60 08/25/2018 1716   Lab Results  Component Value Date   HGBA1C 5.8 05/26/2018   HGBA1C 5.5 04/10/2017   HGBA1C 5.4 04/07/2015   Lab Results  Component Value Date   INSULIN 12.2 06/17/2018   CBC    Component Value Date/Time   WBC 10.8 (H) 08/25/2018 1716   RBC 4.45 08/25/2018 1716   HGB 13.6 08/25/2018 1716   HGB 14.2 07/09/2018 1459   HCT 41.5 08/25/2018 1716   HCT 40.4 07/09/2018 1459   PLT 357 08/25/2018 1716   PLT 316 07/09/2018 1459   MCV 93.3 08/25/2018 1716   MCV 88 07/09/2018 1459   MCH 30.6 08/25/2018 1716   MCHC 32.8 08/25/2018 1716   RDW 13.2 08/25/2018 1716   RDW 13.0 07/09/2018 1459   LYMPHSABS 2.6 07/09/2018 1459   MONOABS 0.2 05/22/2018 1540   EOSABS 0.1 07/09/2018 1459   BASOSABS 0.1 07/09/2018 1459   Iron/TIBC/Ferritin/ %Sat    Component Value Date/Time   IRON 31 05/22/2018 1540   TIBC 373 05/22/2018 1540   FERRITIN 38 08/25/2018 1425   FERRITIN 42 04/07/2015 0938   IRONPCTSAT 8 (L) 05/22/2018 1540   Lipid Panel     Component Value Date/Time   CHOL 178 05/26/2018 0842   CHOL 202 (H) 04/10/2017 0821   TRIG 256.0 (H) 05/26/2018 0842   HDL 62.60 05/26/2018 0842   HDL 67 04/10/2017 0821   CHOLHDL 3 05/26/2018 1610  VLDL 51.2 (H) 05/26/2018 0842   LDLCALC 82 04/10/2017 0821   LDLDIRECT 106.0 05/26/2018 0842   Hepatic Function Panel     Component Value Date/Time   PROT 7.2 08/25/2018 1716   PROT 6.7 04/10/2017 0821   ALBUMIN 4.3 08/25/2018 1716   ALBUMIN 4.2 04/10/2017 0821   AST 11 (L) 08/25/2018 1716   ALT 16 08/25/2018 1716   ALKPHOS 54 08/25/2018 1716   BILITOT 0.5 08/25/2018 1716   BILITOT 0.3 04/10/2017 0821   BILIDIR 0.1 09/12/2017 1529   BILIDIR 0.09 04/10/2017 0821   IBILI 0.2 09/12/2017 1529      Component Value Date/Time   TSH 0.57 05/26/2018 0842   TSH 0.54 12/26/2016 0805   TSH  1.40 02/28/2015 1406      I, Trixie Dredge, am acting as transcriptionist for Ilene Qua, MD  I have reviewed the above documentation for accuracy and completeness, and I agree with the above. - Ilene Qua, MD

## 2018-08-28 ENCOUNTER — Ambulatory Visit (INDEPENDENT_AMBULATORY_CARE_PROVIDER_SITE_OTHER): Payer: Managed Care, Other (non HMO) | Admitting: Internal Medicine

## 2018-08-28 ENCOUNTER — Encounter: Payer: Self-pay | Admitting: Internal Medicine

## 2018-08-28 DIAGNOSIS — I10 Essential (primary) hypertension: Secondary | ICD-10-CM

## 2018-08-28 DIAGNOSIS — N3000 Acute cystitis without hematuria: Secondary | ICD-10-CM

## 2018-08-28 DIAGNOSIS — M79676 Pain in unspecified toe(s): Secondary | ICD-10-CM

## 2018-08-28 DIAGNOSIS — R600 Localized edema: Secondary | ICD-10-CM

## 2018-08-28 DIAGNOSIS — F320 Major depressive disorder, single episode, mild: Secondary | ICD-10-CM

## 2018-08-28 DIAGNOSIS — D500 Iron deficiency anemia secondary to blood loss (chronic): Secondary | ICD-10-CM | POA: Diagnosis not present

## 2018-08-28 DIAGNOSIS — E876 Hypokalemia: Secondary | ICD-10-CM

## 2018-08-28 DIAGNOSIS — K219 Gastro-esophageal reflux disease without esophagitis: Secondary | ICD-10-CM | POA: Diagnosis not present

## 2018-08-28 DIAGNOSIS — K5900 Constipation, unspecified: Secondary | ICD-10-CM

## 2018-08-28 DIAGNOSIS — E039 Hypothyroidism, unspecified: Secondary | ICD-10-CM

## 2018-08-28 DIAGNOSIS — F32A Depression, unspecified: Secondary | ICD-10-CM

## 2018-08-28 DIAGNOSIS — R739 Hyperglycemia, unspecified: Secondary | ICD-10-CM

## 2018-08-28 NOTE — Progress Notes (Signed)
Patient ID: Lori Le, female   DOB: Nov 04, 1965, 53 y.o.   MRN: [id number]   Virtual Visit via video Note  This visit type was conducted due to national recommendations for restrictions regarding the COVID-19 pandemic (e.g. social distancing).  This format is felt to be most appropriate for this patient at this time.  All issues noted in this document were discussed and addressed.  No physical exam was performed (except for noted visual exam findings with Video Visits).   I connected with Lori Le by a video enabled telemedicine application and verified that I am speaking with the correct person using two identifiers. Location patient: home Location provider: work  Persons participating in the virtual visit: patient, provider  I discussed the limitations, risks, security and privacy concerns of performing an evaluation and management service by video and the availability of in person appointments.   The patient expressed understanding and agreed to proceed.   Reason for visit: work in visit  HPI: Work in visit to discuss her leg swelling and low potassium.  Has had issues with lower extremity swelling.  Takes lasix and hctz.  Have tried to get her off of one of these medications, but she has issues with leg swelling when attempted.  Has compression hose and lymphedema pump.  Not using regularly.  Discussed diet and exercise.  Seeing Dr Adair Patter for weight loss.  She has started walking.  Planning for toe surgery.  No chest pain or tightness with increased activity or exertion.  No sob with exertion.  No acid reflux.  Was seen in ER 08/25/18 for lower abdominal discomfort.  CT revealed constipation.  Treated for UTI.  Drinking cranberry juice.  Discussed staying hydrated.  She is taking miralax tid.  Drinking apple juice.  Recently took enema with some results.  Discussed taking another enema.  Discussed the need to try to decrease her hctz dose, given she is on lasix.     ROS: See pertinent  positives and negatives per HPI.  Past Medical History:  Diagnosis Date   Allergic rhinitis due to allergen    Allergy    Anemia    Anxiety    Arthritis    B12 deficiency    Cancer (HCC)    skin ca   Chronic idiopathic urticaria    Complication of anesthesia    nausea, slow to wake up   Depression    Edema    Family history of adverse reaction to anesthesia    Father - PONV   Gastric ulcer    GERD (gastroesophageal reflux disease)    Gout    Gout    Hashimoto's thyroiditis    Heart murmur    History of eating disorder    Hives    Hyperlipidemia    Hypertension    Hypothyroidism    Lower extremity edema    Melanoma in situ ([place])    left shoulder   Migraine    approx 4x/yr   Mitral valve disorder    Motion sickness    all moving vehicles   Neuropathy    fingers and feet   PCOS (polycystic ovarian syndrome)    PONV (postoperative nausea and vomiting)    Thyroid disease    Ulcer    Vertigo    last episode over 1 yr ago    Past Surgical History:  Procedure Laterality Date   BREAST BIOPSY Right 2007   neg- bx/clip   CHOLECYSTECTOMY  2003   COLONOSCOPY WITH  PROPOFOL N/A 05/11/2018   Procedure: COLONOSCOPY WITH PROPOFOL;  Surgeon: [name], MD;  Location: [facility] ENDOSCOPY;  Service: Endoscopy;  Laterality: N/A;   DILITATION & CURRETTAGE/HYSTROSCOPY WITH NOVASURE ABLATION N/A 08/19/2016   Procedure: DILATATION & CURETTAGE/HYSTEROSCOPY WITH NOVASURE ABLATION;  Surgeon: [name], MD;  Location: [facility] ORS;  Service: Gynecology;  Laterality: N/A;   ESOPHAGOGASTRODUODENOSCOPY (EGD) WITH PROPOFOL N/A 12/05/2015   Procedure: ESOPHAGOGASTRODUODENOSCOPY (EGD) WITH PROPOFOL;  Surgeon: Lollie Sails, MD;  Location: [facility] ENDOSCOPY;  Service: Endoscopy;  Laterality: N/A;   ESOPHAGOGASTRODUODENOSCOPY (EGD) WITH PROPOFOL N/A 02/06/2016   Procedure: ESOPHAGOGASTRODUODENOSCOPY (EGD) WITH PROPOFOL;  Surgeon: Lollie Sails, MD;   Location: [facility] ENDOSCOPY;  Service: Endoscopy;  Laterality: N/A;   ESOPHAGOGASTRODUODENOSCOPY (EGD) WITH PROPOFOL N/A 05/11/2018   Procedure: ESOPHAGOGASTRODUODENOSCOPY (EGD) WITH PROPOFOL;  Surgeon: [name], MD;  Location: [facility] ENDOSCOPY;  Service: Endoscopy;  Laterality: N/A;   FOOT SURGERY  1994   GASTRIC BYPASS  2011   JOINT REPLACEMENT Left 09/27/2014   HIP   JOINT REPLACEMENT Left 12/01/2014   KNEE   KNEE ARTHROSCOPY Right    KNEE ARTHROSCOPY Left 03/23/2015   Procedure: ARTHROSCOPY KNEE, partial synovectomy;  Surgeon: Hessie Knows, MD;  Location: [facility] ORS;  Service: Orthopedics;  Laterality: Left;   LAPAROSCOPIC GASTRIC BANDING  2010   ROTATOR CUFF REPAIR Bilateral    right shoulder 07-26-2016   SHOULDER ARTHROSCOPY Left 06/20/2015   Procedure: ARTHROSCOPY SHOULDER, REPAIR OF MASSIVE ROTATOR CUFF TEAR, TENODESIS, DECOMPRESSION DEBRIDEMENT;  Surgeon: Corky Mull, MD;  Location: [facility] ORS;  Service: Orthopedics;  Laterality: Left;   TOTAL HIP ARTHROPLASTY Left 09/27/2014   Procedure: TOTAL HIP ARTHROPLASTY ANTERIOR APPROACH;  Surgeon: Hessie Knows, MD;  Location: [facility] ORS;  Service: Orthopedics;  Laterality: Left;   TOTAL KNEE ARTHROPLASTY Left 12/01/2014   Procedure: TOTAL KNEE ARTHROPLASTY;  Surgeon: Hessie Knows, MD;  Location: [facility] ORS;  Service: Orthopedics;  Laterality: Left;    Family History  Problem Relation Age of Onset   Arthritis Mother    Hyperlipidemia Mother    Hypertension Mother    Diabetes Mother    Sleep apnea Mother    Obesity Mother    Arthritis Father    Hyperlipidemia Father    Hypertension Father    Mental illness Father    Diabetes Father    Obesity Father    Sleep apnea Father    Bipolar disorder Father    Anxiety disorder Father    Depression Father    Cancer Father    Kidney disease Father    Arthritis Maternal Grandmother    Cancer Maternal Grandmother        breast cancer   Hyperlipidemia Maternal  Grandmother    Hypertension Maternal Grandmother    Breast cancer Maternal Grandmother    Arthritis Maternal Grandfather    Hyperlipidemia Maternal Grandfather    Hypertension Maternal Grandfather    Heart disease Maternal Grandfather        heart attack   Breast cancer Paternal Grandmother     SOCIAL HX: reviewed.    Current Outpatient Medications:    allopurinol (ZYLOPRIM) 300 MG tablet, TAKE 1 TABLET BY MOUTH EVERY DAY, Disp: 90 tablet, Rfl: 2   cephALEXin (KEFLEX) 500 MG capsule, Take 1 capsule (500 mg total) by mouth 3 (three) times daily., Disp: 21 capsule, Rfl: 0   cyanocobalamin (,VITAMIN B-12,) 1000 MCG/ML injection, ADM 1 ML IM Q 30 DAYS, Disp: , Rfl:    Dupilumab (DUPIXENT) 300 MG/2ML SOSY, ,  Disp: , Rfl:    furosemide (LASIX) 40 MG tablet, Take 1 tablet (40 mg total) by mouth daily., Disp: 90 tablet, Rfl: 0   gabapentin (NEURONTIN) 100 MG capsule, Take 200 mg by mouth 2 (two) times daily., Disp: , Rfl:    levothyroxine (SYNTHROID) 137 MCG tablet, TK 1 T PO QD 30 TO 60 MIN B BRE OES AND WITH A GLASS OF WATER, Disp: , Rfl:    metFORMIN (GLUCOPHAGE) 500 MG tablet, Take 1 tablet (500 mg total) by mouth daily with breakfast., Disp: 30 tablet, Rfl: 0   metolazone (ZAROXOLYN) 5 MG tablet, Take 5 mg by mouth daily as needed. , Disp: , Rfl:    montelukast (SINGULAIR) 10 MG tablet, Take 10 mg by mouth at bedtime., Disp: , Rfl:    Multiple Vitamins-Minerals (ZINC PO), Take by mouth daily., Disp: , Rfl:    pantoprazole (PROTONIX) 40 MG tablet, Take 1 tablet (40 mg total) by mouth 2 (two) times daily before a meal., Disp: 180 tablet, Rfl: 0   polyethylene glycol powder (GLYCOLAX/MIRALAX) 17 GM/SCOOP powder, Take 17 g by mouth daily as needed for moderate constipation., Disp: 255 g, Rfl: 0   potassium chloride SA (K-DUR,KLOR-CON) 20 MEQ tablet, Take two tablets (40 meq) by mouth daily in the morning and 1 tablet (20 meq) by mouth daily in the evening, Disp: 90 tablet,  Rfl: 0   sertraline (ZOLOFT) 50 MG tablet, Take 1.5 tablets (75 mg total) by mouth daily., Disp: 135 tablet, Rfl: 0   triamcinolone (KENALOG) 0.025 % cream, APP EXT AA BID, Disp: , Rfl:    triamterene-hydrochlorothiazide (MAXZIDE-25) 37.5-25 MG tablet, TAKE 1 TABLET BY MOUTH EVERY DAY, Disp: 90 tablet, Rfl: 0 No current facility-administered medications for this visit.   Facility-Administered Medications Ordered in Other Visits:    0.9 %  sodium chloride infusion, , Intravenous, Continuous, Corcoran, Melissa C, MD, Last Rate: 0 mL/hr at 05/08/17 1437   cyanocobalamin ((VITAMIN B-12)) injection 1,000 mcg, 1,000 mcg, Intramuscular, Once, Corcoran, Drue Second, MD  EXAM:  GENERAL: alert, oriented, appears well and in no acute distress  HEENT: atraumatic, conjunttiva clear, no obvious abnormalities on inspection of external nose and ears  NECK: normal movements of the head and neck  LUNGS: on inspection no signs of respiratory distress, breathing rate appears normal, no obvious gross SOB, gasping or wheezing  CV: no obvious cyanosis  PSYCH/NEURO: pleasant and cooperative, no obvious depression or anxiety, speech and thought processing grossly intact  ASSESSMENT AND PLAN:  Discussed the following assessment and plan:  Iron deficiency anemia due to chronic blood loss  Bilateral lower extremity edema  Essential hypertension, benign  Gastroesophageal reflux disease, esophagitis presence not specified  Hyperglycemia  Hypothyroidism, unspecified type  Mild depression (HCC)  Pain of toe, unspecified laterality  Hypokalemia - Plan: Basic metabolic panel  Constipation, unspecified constipation type  Acute cystitis without hematuria  Anemia 08/25/18 hgb wnl.   Bilateral lower extremity edema Persistent intermittent issues with swelling.  Needs to wear compression hose.  Has lymphedema pump.  On lasix and hctz.  Have tried to get her off one of these, but she had increased  swelling.  Will change the hctz to qod.  Continue lasix.  Follow potassium and swelling.    Essential hypertension, benign Blood pressure has been under good control.  Follow pressures.  Follow metabolic panel.  Will need recheck potassium prior to her surgery.    GERD (gastroesophageal reflux disease) Controlled on current regimen.  Hyperglycemia Low carb diet and exercise.  Follow met b and a1c.    Hypothyroidism On thyroid replacement.  Follow tsh.   Mild depression ([place]) Has seen psychiatry.  On zoloft.    Toe pain Planning for toe surgery.  Having no chest pain or sob with exertion.  Potassium low.  Taking potassium supplements.  Recheck potassium prior to her surgery.  Will need close intra op and post op monitoring of her heart rate and blood pressure to avoid extremes.    Hypokalemia Low potassium and low chloride - persistent intermittent problems.  On potassium supplements.  Adjust medication as outlined.  Recheck potassium prior to surgery.    Constipation Will take another enema.  After bowel movement, start daily miralax and dulcolax.  Follow. Notify me if persistent problems with constipation.  She is feeling better.    UTI (urinary tract infection) Was evaluated in ER.  Being treated.  Needs to confirm symptoms clear prior to surgery.     I discussed the assessment and treatment plan with the patient. The patient was provided an opportunity to ask questions and all were answered. The patient agreed with the plan and demonstrated an understanding of the instructions.   The patient was advised to call back or seek an in-person evaluation if the symptoms worsen or if the condition fails to improve as anticipated.  I provided 25 minutes of non-face-to-face time during this encounter.   Einar Pheasant, MD

## 2018-08-30 ENCOUNTER — Encounter: Payer: Self-pay | Admitting: Internal Medicine

## 2018-08-30 DIAGNOSIS — E876 Hypokalemia: Secondary | ICD-10-CM | POA: Insufficient documentation

## 2018-08-30 DIAGNOSIS — K5900 Constipation, unspecified: Secondary | ICD-10-CM | POA: Insufficient documentation

## 2018-08-30 DIAGNOSIS — M79676 Pain in unspecified toe(s): Secondary | ICD-10-CM | POA: Insufficient documentation

## 2018-08-30 DIAGNOSIS — N390 Urinary tract infection, site not specified: Secondary | ICD-10-CM | POA: Insufficient documentation

## 2018-08-30 NOTE — Assessment & Plan Note (Signed)
08/25/18 hgb wnl.

## 2018-08-30 NOTE — Assessment & Plan Note (Signed)
On thyroid replacement.  Follow tsh.  

## 2018-08-30 NOTE — Assessment & Plan Note (Signed)
Low carb diet and exercise.  Follow met b and a1c.   

## 2018-08-30 NOTE — Assessment & Plan Note (Signed)
Controlled on current regimen.   

## 2018-08-30 NOTE — Assessment & Plan Note (Signed)
Blood pressure has been under good control.  Follow pressures.  Follow metabolic panel.  Will need recheck potassium prior to her surgery.

## 2018-08-30 NOTE — Assessment & Plan Note (Signed)
Planning for toe surgery.  Having no chest pain or sob with exertion.  Potassium low.  Taking potassium supplements.  Recheck potassium prior to her surgery.  Will need close intra op and post op monitoring of her heart rate and blood pressure to avoid extremes.

## 2018-08-30 NOTE — Assessment & Plan Note (Addendum)
Was evaluated in ER.  Being treated.  Needs to confirm symptoms clear prior to surgery.

## 2018-08-30 NOTE — Assessment & Plan Note (Signed)
Has seen psychiatry.  On zoloft.

## 2018-08-30 NOTE — Assessment & Plan Note (Signed)
Persistent intermittent issues with swelling.  Needs to wear compression hose.  Has lymphedema pump.  On lasix and hctz.  Have tried to get her off one of these, but she had increased swelling.  Will change the hctz to qod.  Continue lasix.  Follow potassium and swelling.

## 2018-08-30 NOTE — Assessment & Plan Note (Signed)
Low potassium and low chloride - persistent intermittent problems.  On potassium supplements.  Adjust medication as outlined.  Recheck potassium prior to surgery.

## 2018-08-30 NOTE — Assessment & Plan Note (Signed)
Will take another enema.  After bowel movement, start daily miralax and dulcolax.  Follow. Notify me if persistent problems with constipation.  She is feeling better.

## 2018-08-31 ENCOUNTER — Other Ambulatory Visit: Payer: Self-pay

## 2018-08-31 ENCOUNTER — Encounter
Admission: RE | Admit: 2018-08-31 | Discharge: 2018-08-31 | Disposition: A | Discharge status: Home / Self Care | Payer: Managed Care, Other (non HMO) | Source: Ambulatory Visit | Attending: Podiatry | Admitting: Podiatry

## 2018-08-31 ENCOUNTER — Encounter: Payer: Self-pay | Admitting: Internal Medicine

## 2018-08-31 DIAGNOSIS — Z9884 Bariatric surgery status: Secondary | ICD-10-CM | POA: Diagnosis not present

## 2018-08-31 DIAGNOSIS — Z79899 Other long term (current) drug therapy: Secondary | ICD-10-CM | POA: Diagnosis not present

## 2018-08-31 DIAGNOSIS — Z79890 Hormone replacement therapy (postmenopausal): Secondary | ICD-10-CM | POA: Diagnosis not present

## 2018-08-31 DIAGNOSIS — M2032 Hallux varus (acquired), left foot: Secondary | ICD-10-CM | POA: Diagnosis present

## 2018-08-31 DIAGNOSIS — Z01812 Encounter for preprocedural laboratory examination: Secondary | ICD-10-CM | POA: Insufficient documentation

## 2018-08-31 DIAGNOSIS — Z1159 Encounter for screening for other viral diseases: Secondary | ICD-10-CM | POA: Insufficient documentation

## 2018-08-31 DIAGNOSIS — Q6689 Other  specified congenital deformities of feet: Secondary | ICD-10-CM | POA: Diagnosis not present

## 2018-08-31 DIAGNOSIS — M109 Gout, unspecified: Secondary | ICD-10-CM | POA: Diagnosis not present

## 2018-08-31 DIAGNOSIS — Z96642 Presence of left artificial hip joint: Secondary | ICD-10-CM | POA: Diagnosis not present

## 2018-08-31 DIAGNOSIS — M62472 Contracture of muscle, left ankle and foot: Secondary | ICD-10-CM | POA: Diagnosis not present

## 2018-08-31 DIAGNOSIS — I10 Essential (primary) hypertension: Secondary | ICD-10-CM | POA: Diagnosis not present

## 2018-08-31 DIAGNOSIS — E063 Autoimmune thyroiditis: Secondary | ICD-10-CM | POA: Diagnosis not present

## 2018-08-31 DIAGNOSIS — Z96652 Presence of left artificial knee joint: Secondary | ICD-10-CM | POA: Diagnosis not present

## 2018-08-31 DIAGNOSIS — K210 Gastro-esophageal reflux disease with esophagitis: Secondary | ICD-10-CM | POA: Diagnosis not present

## 2018-09-01 ENCOUNTER — Other Ambulatory Visit: Payer: Self-pay

## 2018-09-01 ENCOUNTER — Ambulatory Visit (INDEPENDENT_AMBULATORY_CARE_PROVIDER_SITE_OTHER): Payer: Managed Care, Other (non HMO) | Admitting: Gastroenterology

## 2018-09-01 ENCOUNTER — Telehealth: Payer: Self-pay

## 2018-09-01 ENCOUNTER — Encounter: Payer: Self-pay | Admitting: Gastroenterology

## 2018-09-01 ENCOUNTER — Other Ambulatory Visit (INDEPENDENT_AMBULATORY_CARE_PROVIDER_SITE_OTHER): Payer: Managed Care, Other (non HMO)

## 2018-09-01 DIAGNOSIS — Z8711 Personal history of peptic ulcer disease: Secondary | ICD-10-CM

## 2018-09-01 DIAGNOSIS — K5900 Constipation, unspecified: Secondary | ICD-10-CM | POA: Diagnosis not present

## 2018-09-01 DIAGNOSIS — Z9884 Bariatric surgery status: Secondary | ICD-10-CM

## 2018-09-01 DIAGNOSIS — Z8719 Personal history of other diseases of the digestive system: Secondary | ICD-10-CM

## 2018-09-01 DIAGNOSIS — E876 Hypokalemia: Secondary | ICD-10-CM

## 2018-09-01 LAB — NOVEL CORONAVIRUS, NAA (HOSP ORDER, SEND-OUT TO REF LAB; TAT 18-24 HRS): SARS-CoV-2, NAA: NOT DETECTED

## 2018-09-01 MED ORDER — LINACLOTIDE 145 MCG PO CAPS: 145.0000 ug | ORAL_CAPSULE | Freq: Every day | ORAL | 1 refills | Status: DC

## 2018-09-01 NOTE — Progress Notes (Signed)
Lori Antigua, MD [address]  [place], [place] [address]  Main: [phone]  Fax: [phone]   Primary Care Physician: Einar Pheasant, MD  Virtual Visit via Video Note  I connected with patient on 09/01/18 at  9:30 AM EDT by video (using [email or web address]) and verified that I am speaking with the correct person using two identifiers.   I discussed the limitations, risks, security and privacy concerns of performing an evaluation and management service by video and the availability of in person appointments. I also discussed with the patient that there may be a patient responsible charge related to this service. The patient expressed understanding and agreed to proceed.  Location of Patient: Home Location of Provider: Home Persons involved: Patient and provider only (Nursing staff checked in patient via phone but were not physically involved in the video interaction - see their notes)   History of Present Illness: Chief Complaint  Patient presents with   Follow-up    IDA, Hx Gastric Ulcer    HPI: Lori Le is a 53 y.o. female with history of iron deficiency anemia here for follow-up.  History of gastric bypass.   The patient denies abdominal or flank pain, anorexia, nausea or vomiting, dysphagia, change in bowel habits or black or bloody stools or weight loss.  Reports chronic history of constipation for years.  Has had to take enemas every 1 to 2 months to help her have a bowel movement.  Lately took 2 enemas over the last 2 weeks which led to bowel movements.  Without this even with taking MiraLAX 3 times a day and Dulcolax once daily she did not have any bowel movements.  Has not been on any pharmacologic medication for constipation besides the above.  Patient is taking Protonix twice daily due to ulcer seen on her upper endoscopy in February 2020  Upper endoscopy February 2020 showed 1 tongue of salmon-colored mucosa at the GE junction.  Gastric bypass  anatomy seen and ulceration seen at the gastrojejunal anastomosis.  GE junction biopsies showed reflux gastroesophagitis.  Stomach biopsies were negative for H. pylori.  Colonoscopy February 2020, with 110 mm and 3 subcentimeter polyps removed.  Repeat recommended in 3 years with 2-day prep.  Pathology showed tubular adenoma.  Had an EGD in September 2017 obtained by Dr. Gustavo Lah for melena.  8.5 cm gastric pouch was reported.  Erythema and edema were reported at the gastrojejunal anastomosis.  EGD was repeated in November 2017 and reported healthy appearing anastomosis.  Biopsies showed reactive gastropathy.  Current Outpatient Medications  Medication Sig Dispense Refill   allopurinol (ZYLOPRIM) 300 MG tablet TAKE 1 TABLET BY MOUTH EVERY DAY 90 tablet 2   cephALEXin (KEFLEX) 500 MG capsule Take 1 capsule (500 mg total) by mouth 3 (three) times daily. 21 capsule 0   cyanocobalamin (,VITAMIN B-12,) 1000 MCG/ML injection ADM 1 ML IM Q 30 DAYS     Dupilumab (DUPIXENT) 300 MG/2ML SOSY      furosemide (LASIX) 40 MG tablet Take 1 tablet (40 mg total) by mouth daily. 90 tablet 0   gabapentin (NEURONTIN) 100 MG capsule Take 200 mg by mouth 2 (two) times daily.     levothyroxine (SYNTHROID) 137 MCG tablet TK 1 T PO QD 30 TO 60 MIN B BRE OES AND WITH A GLASS OF WATER     metFORMIN (GLUCOPHAGE) 500 MG tablet Take 1 tablet (500 mg total) by mouth daily with breakfast. 30 tablet 0   metolazone (  ZAROXOLYN) 5 MG tablet Take 5 mg by mouth daily as needed.      montelukast (SINGULAIR) 10 MG tablet Take 10 mg by mouth at bedtime.     pantoprazole (PROTONIX) 40 MG tablet Take 1 tablet (40 mg total) by mouth 2 (two) times daily before a meal. 180 tablet 0   polyethylene glycol powder (GLYCOLAX/MIRALAX) 17 GM/SCOOP powder Take 17 g by mouth daily as needed for moderate constipation. 255 g 0   potassium chloride SA (K-DUR,KLOR-CON) 20 MEQ tablet Take two tablets (40 meq) by mouth daily in the morning and  1 tablet (20 meq) by mouth daily in the evening 90 tablet 0   sertraline (ZOLOFT) 50 MG tablet Take 1.5 tablets (75 mg total) by mouth daily. 135 tablet 0   triamcinolone (KENALOG) 0.025 % cream APP EXT AA BID     triamterene-hydrochlorothiazide (MAXZIDE-25) 37.5-25 MG tablet TAKE 1 TABLET BY MOUTH EVERY DAY 90 tablet 0   linaclotide (LINZESS) 145 MCG CAPS capsule Take 1 capsule (145 mcg total) by mouth daily before breakfast for 30 days. 30 capsule 1   No current facility-administered medications for this visit.    Facility-Administered Medications Ordered in Other Visits  Medication Dose Route Frequency Provider Last Rate Last Dose   0.9 %  sodium chloride infusion   Intravenous Continuous Mike Gip, Melissa C, MD 0 mL/hr at 05/08/17 1437     cyanocobalamin ((VITAMIN B-12)) injection 1,000 mcg  1,000 mcg Intramuscular Once Lequita Asal, MD        Allergies as of 09/01/2018 - Review Complete 09/01/2018  Allergen Reaction Noted   Contrast media [iodinated diagnostic agents] Anaphylaxis 05/24/2017   Etodolac Anaphylaxis 02/05/2017   Alpha-gal  08/27/2018   Influenza vaccines Hives 12/09/2013   Lactose Nausea And Vomiting 04/25/2015   Lactose intolerance (gi) Nausea And Vomiting 09/27/2014   Bacitracin-neomycin-polymyxin Rash 04/25/2015   Neomycin-bacitracin zn-polymyx Rash    Neosporin + pain relief max st [neomy-bacit-polymyx-pramoxine] Rash 07/31/2012    Review of Systems:    All systems reviewed and negative except where noted in HPI.   Observations/Objective:  Labs: CMP     Component Value Date/Time   NA 137 08/25/2018 1716   NA 139 08/10/2018 1012   K 3.2 (L) 08/25/2018 1716   K 4.0 09/27/2014   CL 93 (L) 08/25/2018 1716   CO2 32 08/25/2018 1716   GLUCOSE 102 (H) 08/25/2018 1716   BUN 19 08/25/2018 1716   BUN 11 08/10/2018 1012   CREATININE 0.93 08/25/2018 1716   CREATININE 0.81 09/12/2017 1529   CALCIUM 8.9 08/25/2018 1716   PROT 7.2  08/25/2018 1716   PROT 6.7 04/10/2017 0821   ALBUMIN 4.3 08/25/2018 1716   ALBUMIN 4.2 04/10/2017 0821   AST 11 (L) 08/25/2018 1716   ALT 16 08/25/2018 1716   ALKPHOS 54 08/25/2018 1716   BILITOT 0.5 08/25/2018 1716   BILITOT 0.3 04/10/2017 0821   GFRNONAA >60 08/25/2018 1716   GFRAA >60 08/25/2018 1716   Lab Results  Component Value Date   WBC 10.8 (H) 08/25/2018   HGB 13.6 08/25/2018   HCT 41.5 08/25/2018   MCV 93.3 08/25/2018   PLT 357 08/25/2018    Imaging Studies: Ct Renal Stone Study  Result Date: 08/25/2018 CLINICAL DATA:  Dysuria.  Left lower quadrant abdominal pain. EXAM: CT ABDOMEN AND PELVIS WITHOUT CONTRAST TECHNIQUE: Multidetector CT imaging of the abdomen and pelvis was performed following the standard protocol without IV contrast. COMPARISON:  08/22/2004 CT scan FINDINGS:  Lower chest: Unremarkable Hepatobiliary: Cholecystectomy.  Otherwise unremarkable. Pancreas: Unremarkable Spleen: Unremarkable Adrenals/Urinary Tract: Unremarkable kidneys and adrenal glands. The bladder appears normal. No urinary tract calculi are identified. Stomach/Bowel: Gastric bypass. Prominent stool throughout the colon favors constipation. Vascular/Lymphatic: Aortoiliac atherosclerotic vascular disease. Unremarkable Reproductive: Unremarkable Other: No supplemental non-categorized findings. Musculoskeletal: Left total hip prosthesis. Subcutaneous cystic lesion along the right posterior flank, probably a sebaceous cyst. Moderate degenerative loss of articular space in the right hip. Transitional L5 vertebra with posterior spurring at L4-5 and grade 1 anterolisthesis at L3-4. IMPRESSION: 1.  Prominent stool throughout the colon favors constipation. 2. Prior gastric bypass. 3.  Aortic Atherosclerosis (ICD10-I70.0). 4. Lower lumbar spondylosis. Electronically Signed   By: Van Clines M.D.   On: 08/25/2018 19:35    Assessment and Plan:   Lori Le is a 53 y.o. 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  I have discussed alternative options, risks & benefits,  which include, but are not limited to, bleeding, infection, perforation,respiratory complication & drug reaction.  The patient agrees with this plan & written consent will be obtained.    Due to chronic constipation unrelieved with MiraLAX, high-fiber diet and water intake, will start on Linzess 145 MCG once daily.  However, may have to change to another medication depending on insurance approval.  Follow Up Instructions: Follow-up in 3 months   I discussed the assessment and treatment plan with the patient. The patient was provided an opportunity to ask questions and all were answered. The patient agreed with the plan and demonstrated an understanding of the instructions.   The patient was advised to call back or seek an in-person evaluation if the symptoms worsen or if the condition fails to improve as anticipated.  I provided 15 minutes of face-to-face time via video software during this encounter.   [name], MD  Speech recognition software was used to dictate this note.

## 2018-09-01 NOTE — Progress Notes (Signed)
Patient ID: Lori Le, female   DOB: [birth year], 53 y.o.   MRN: [id number] Pt scheduled EGD for 09/08/18 at [facility]. Aware to have covid-19 testing done. She just had this done for her foot surgery but she may need another one. Pt to let PAT know.

## 2018-09-01 NOTE — Telephone Encounter (Signed)
Scheduled for potassium recheck today

## 2018-09-01 NOTE — Telephone Encounter (Signed)
LMTCB to schedule lab appt to recheck potassium

## 2018-09-01 NOTE — Telephone Encounter (Signed)
Pt called back. °

## 2018-09-01 NOTE — Addendum Note (Signed)
Addended by: Leeanne Rio on: 09/01/2018 03:27 PM   Modules accepted: Orders

## 2018-09-01 NOTE — Telephone Encounter (Signed)
-----   Message from Einar Pheasant, MD sent at 09/01/2018  8:11 AM EDT ----- Regarding: needs lab check Needs non fasting lab.  Her surgery is Thursday.  Needs a f/u potassium check prior to her surgery.  Pt aware.  See if she can come in today or tomorrow.    Dr Nicki Reaper

## 2018-09-02 ENCOUNTER — Other Ambulatory Visit: Payer: Self-pay

## 2018-09-02 ENCOUNTER — Encounter: Payer: Self-pay | Admitting: *Deleted

## 2018-09-02 ENCOUNTER — Ambulatory Visit: Payer: Self-pay

## 2018-09-02 LAB — BASIC METABOLIC PANEL
BUN/Creatinine Ratio: 16 (ref 9–23)
BUN: 16 mg/dL (ref 6–24)
CO2: 30 mmol/L — ABNORMAL HIGH (ref 20–29)
Calcium: 8.8 mg/dL (ref 8.7–10.2)
Chloride: 92 mmol/L — ABNORMAL LOW (ref 96–106)
Creatinine, Ser: 0.98 mg/dL (ref 0.57–1.00)
GFR calc Af Amer: 77 mL/min/{1.73_m2} (ref >59)
GFR calc non Af Amer: 67 mL/min/{1.73_m2} (ref >59)
Glucose: 101 mg/dL — ABNORMAL HIGH (ref 65–99)
Potassium: 3.7 mmol/L (ref 3.5–5.2)
Sodium: 139 mmol/L (ref 134–144)

## 2018-09-02 NOTE — Telephone Encounter (Signed)
Provided lab results per Dr.  Einar Pheasant of  09/02/18.  Patient voices understanding.  Will call back  To make appointment for 3 week out.

## 2018-09-03 ENCOUNTER — Encounter
Admission: RE | Disposition: A | Discharge status: Home / Self Care | Payer: Self-pay | Source: Home / Self Care | Attending: Podiatry

## 2018-09-03 ENCOUNTER — Ambulatory Visit: Payer: Managed Care, Other (non HMO) | Admitting: Anesthesiology

## 2018-09-03 ENCOUNTER — Encounter: Payer: Self-pay | Admitting: Podiatry

## 2018-09-03 ENCOUNTER — Ambulatory Visit
Admission: RE | Admit: 2018-09-03 | Discharge: 2018-09-03 | Disposition: A | Discharge status: Home / Self Care | Payer: Managed Care, Other (non HMO) | Attending: Podiatry | Admitting: Podiatry

## 2018-09-03 DIAGNOSIS — Z79899 Other long term (current) drug therapy: Secondary | ICD-10-CM | POA: Insufficient documentation

## 2018-09-03 DIAGNOSIS — M2032 Hallux varus (acquired), left foot: Secondary | ICD-10-CM | POA: Diagnosis not present

## 2018-09-03 DIAGNOSIS — Z79890 Hormone replacement therapy (postmenopausal): Secondary | ICD-10-CM | POA: Insufficient documentation

## 2018-09-03 DIAGNOSIS — Q6689 Other  specified congenital deformities of feet: Secondary | ICD-10-CM | POA: Insufficient documentation

## 2018-09-03 DIAGNOSIS — K210 Gastro-esophageal reflux disease with esophagitis: Secondary | ICD-10-CM | POA: Insufficient documentation

## 2018-09-03 DIAGNOSIS — Z96652 Presence of left artificial knee joint: Secondary | ICD-10-CM | POA: Insufficient documentation

## 2018-09-03 DIAGNOSIS — E063 Autoimmune thyroiditis: Secondary | ICD-10-CM | POA: Insufficient documentation

## 2018-09-03 DIAGNOSIS — M109 Gout, unspecified: Secondary | ICD-10-CM | POA: Insufficient documentation

## 2018-09-03 DIAGNOSIS — Z96642 Presence of left artificial hip joint: Secondary | ICD-10-CM | POA: Insufficient documentation

## 2018-09-03 DIAGNOSIS — M62472 Contracture of muscle, left ankle and foot: Secondary | ICD-10-CM | POA: Insufficient documentation

## 2018-09-03 DIAGNOSIS — Z9884 Bariatric surgery status: Secondary | ICD-10-CM | POA: Insufficient documentation

## 2018-09-03 DIAGNOSIS — I10 Essential (primary) hypertension: Secondary | ICD-10-CM | POA: Insufficient documentation

## 2018-09-03 DIAGNOSIS — Z1159 Encounter for screening for other viral diseases: Secondary | ICD-10-CM | POA: Insufficient documentation

## 2018-09-03 HISTORY — PX: FOOT ARTHRODESIS: SHX1655

## 2018-09-03 HISTORY — DX: Family history of other specified conditions: Z84.89

## 2018-09-03 HISTORY — PX: BONE EXCISION: SHX6730

## 2018-09-03 SURGERY — FUSION, JOINT, FOOT
Anesthesia: General | Site: Foot | Laterality: Left

## 2018-09-03 MED ORDER — ONDANSETRON HCL 4 MG/2ML IJ SOLN
INTRAMUSCULAR | Status: DC | PRN
  Administered 2018-09-03: 4 mg via INTRAVENOUS

## 2018-09-03 MED ORDER — LACTATED RINGERS IV SOLN
10.0000 mL/h | INTRAVENOUS | Status: DC
  Administered 2018-09-03: 10 mL/h via INTRAVENOUS
  Administered 2018-09-03: 09:00:00 via INTRAVENOUS

## 2018-09-03 MED ORDER — FENTANYL CITRATE (PF) 100 MCG/2ML IJ SOLN: 25.0000 ug | INTRAMUSCULAR | Status: DC | PRN

## 2018-09-03 MED ORDER — DEXTROSE 5 % IV SOLN
1000.0000 mg | Freq: Once | INTRAVENOUS | Status: AC
  Administered 2018-09-03: 1000 mg via INTRAVENOUS

## 2018-09-03 MED ORDER — DEXAMETHASONE SODIUM PHOSPHATE 4 MG/ML IJ SOLN
INTRAMUSCULAR | Status: DC | PRN
  Administered 2018-09-03: 4 mg via INTRAVENOUS

## 2018-09-03 MED ORDER — ACETAMINOPHEN 325 MG PO TABS
650.0000 mg | ORAL_TABLET | Freq: Once | ORAL | Status: AC
  Administered 2018-09-03: 650 mg via ORAL

## 2018-09-03 MED ORDER — HYDROCODONE-ACETAMINOPHEN 7.5-325 MG PO TABS: 1.0000 | ORAL_TABLET | Freq: Four times a day (QID) | ORAL | 0 refills | Status: DC | PRN

## 2018-09-03 MED ORDER — LIDOCAINE HCL (CARDIAC) PF 100 MG/5ML IV SOSY
PREFILLED_SYRINGE | INTRAVENOUS | Status: DC | PRN
  Administered 2018-09-03: 40 mg via INTRATRACHEAL

## 2018-09-03 MED ORDER — OXYCODONE HCL 5 MG PO TABS: 5.0000 mg | ORAL_TABLET | Freq: Once | ORAL | Status: DC | PRN

## 2018-09-03 MED ORDER — PROMETHAZINE HCL 25 MG/ML IJ SOLN: 6.2500 mg | INTRAMUSCULAR | Status: DC | PRN

## 2018-09-03 MED ORDER — MIDAZOLAM HCL 5 MG/5ML IJ SOLN
INTRAMUSCULAR | Status: DC | PRN
  Administered 2018-09-03: 2 mg via INTRAVENOUS

## 2018-09-03 MED ORDER — CEFAZOLIN SODIUM-DEXTROSE 2-4 GM/100ML-% IV SOLN: 2.0000 g | INTRAVENOUS | Status: DC

## 2018-09-03 MED ORDER — FENTANYL CITRATE (PF) 100 MCG/2ML IJ SOLN
INTRAMUSCULAR | Status: DC | PRN
  Administered 2018-09-03: 25 ug via INTRAVENOUS
  Administered 2018-09-03 (×2): 12.5 ug via INTRAVENOUS

## 2018-09-03 MED ORDER — DEXMEDETOMIDINE HCL 200 MCG/2ML IV SOLN
INTRAVENOUS | Status: DC | PRN
  Administered 2018-09-03: 8 ug via INTRAVENOUS

## 2018-09-03 MED ORDER — PROPOFOL 10 MG/ML IV BOLUS
INTRAVENOUS | Status: DC | PRN
  Administered 2018-09-03: 250 mg via INTRAVENOUS

## 2018-09-03 MED ORDER — MEPERIDINE HCL 25 MG/ML IJ SOLN: 6.2500 mg | INTRAMUSCULAR | Status: DC | PRN

## 2018-09-03 MED ORDER — POVIDONE-IODINE 7.5 % EX SOLN
Freq: Once | CUTANEOUS | Status: AC
  Administered 2018-09-03: 07:00:00 via TOPICAL

## 2018-09-03 MED ORDER — BUPIVACAINE HCL (PF) 0.25 % IJ SOLN
INTRAMUSCULAR | Status: DC | PRN
  Administered 2018-09-03: 5 mL

## 2018-09-03 MED ORDER — SCOPOLAMINE 1 MG/3DAYS TD PT72
1.0000 | MEDICATED_PATCH | Freq: Once | TRANSDERMAL | Status: DC
  Administered 2018-09-03: 1.5 mg via TRANSDERMAL

## 2018-09-03 MED ORDER — OXYCODONE HCL 5 MG/5ML PO SOLN: 5.0000 mg | Freq: Once | ORAL | Status: DC | PRN

## 2018-09-03 MED ORDER — BUPIVACAINE LIPOSOME 1.3 % IJ SUSP
INTRAMUSCULAR | Status: DC | PRN
  Administered 2018-09-03: 5 mL

## 2018-09-03 SURGICAL SUPPLY — 47 items
APL PRP STRL LF DISP 70% ISPRP (MISCELLANEOUS) ×1
APL SKNCLS STERI-STRIP NONHPOA (GAUZE/BANDAGES/DRESSINGS) ×1
BANDAGE ELASTIC 4 VELCRO NS (GAUZE/BANDAGES/DRESSINGS) ×5 IMPLANT
BENZOIN TINCTURE PRP APPL 2/3 (GAUZE/BANDAGES/DRESSINGS) ×2 IMPLANT
BLADE MINI RND TIP GREEN BEAV (BLADE) ×8 IMPLANT
BLADE OSC/SAGITTAL MD 5.5X18 (BLADE) ×2 IMPLANT
BNDG CMPR 75X41 PLY HI ABS (GAUZE/BANDAGES/DRESSINGS) ×2
BNDG ESMARK 4X12 TAN STRL LF (GAUZE/BANDAGES/DRESSINGS) ×3 IMPLANT
BNDG GAUZE 4.5X4.1 6PLY STRL (MISCELLANEOUS) ×3 IMPLANT
BNDG STRETCH 4X75 STRL LF (GAUZE/BANDAGES/DRESSINGS) ×5 IMPLANT
CANISTER SUCT 1200ML W/VALVE (MISCELLANEOUS) ×3 IMPLANT
CAST PADDING 3X4FT ST 30246 (SOFTGOODS) ×4
CHLORAPREP W/TINT 26 (MISCELLANEOUS) ×2 IMPLANT
CLOSURE WOUND 1/4X4 (GAUZE/BANDAGES/DRESSINGS) ×2
COVER LIGHT HANDLE UNIVERSAL (MISCELLANEOUS) ×6 IMPLANT
COVER PIN YLW 0.028-062 (MISCELLANEOUS) ×8 IMPLANT
CUFF TOURN SGL QUICK 18X4 (TOURNIQUET CUFF) ×4 IMPLANT
DRAPE FLUOR MINI C-ARM 54X84 (DRAPES) ×3 IMPLANT
ELECT REM PT RETURN 9FT ADLT (ELECTROSURGICAL) ×3
ELECTRODE REM PT RTRN 9FT ADLT (ELECTROSURGICAL) ×1 IMPLANT
GAUZE SPONGE 4X4 12PLY STRL (GAUZE/BANDAGES/DRESSINGS) ×5 IMPLANT
GAUZE XEROFORM 1X8 LF (GAUZE/BANDAGES/DRESSINGS) ×2 IMPLANT
GLOVE BIO SURGEON STRL SZ8 (GLOVE) ×11 IMPLANT
GOWN STRL REUS W/ TWL LRG LVL3 (GOWN DISPOSABLE) ×1 IMPLANT
GOWN STRL REUS W/ TWL XL LVL3 (GOWN DISPOSABLE) ×1 IMPLANT
GOWN STRL REUS W/TWL LRG LVL3 (GOWN DISPOSABLE) ×3
GOWN STRL REUS W/TWL XL LVL3 (GOWN DISPOSABLE) ×3
K-WIRE SNGL END 1.2X150 (MISCELLANEOUS) ×6
K-Wire 1.1 mm (0.045") x 152 mm (6") (Wire) ×8 IMPLANT
KIT TURNOVER KIT A (KITS) ×3 IMPLANT
KWIRE SNGL END 1.2X150 (MISCELLANEOUS) IMPLANT
NDL HYPO 25GX1X1/2 BEV (NEEDLE) IMPLANT
NEEDLE HYPO 25GX1X1/2 BEV (NEEDLE) ×3 IMPLANT
NS IRRIG 500ML POUR BTL (IV SOLUTION) ×3 IMPLANT
PACK EXTREMITY ARMC (MISCELLANEOUS) ×3 IMPLANT
PAD CAST CTTN 3X4 STRL (SOFTGOODS) IMPLANT
PADDING CAST COTTON 3X4 STRL (SOFTGOODS) ×2
PENCIL SMOKE EVACUATOR (MISCELLANEOUS) ×3 IMPLANT
SPLINT CAST 1 STEP 4X30 (MISCELLANEOUS) ×5 IMPLANT
STOCKINETTE STRL 6IN 960660 (GAUZE/BANDAGES/DRESSINGS) ×3 IMPLANT
STRIP CLOSURE SKIN 1/4X4 (GAUZE/BANDAGES/DRESSINGS) ×3 IMPLANT
SUT ETHILON 4 0 FS (SUTURE) ×6 IMPLANT
SUT VIC AB 3-0 SH 27 (SUTURE) ×3
SUT VIC AB 3-0 SH 27X BRD (SUTURE) IMPLANT
SUT VIC AB 4-0 FS2 27 (SUTURE) ×4 IMPLANT
SYR 10ML LL (SYRINGE) ×4 IMPLANT
Screw 4.0 x 40 ST (Screw) ×2 IMPLANT

## 2018-09-03 NOTE — Anesthesia Postprocedure Evaluation (Signed)
Anesthesia Post Note  Patient: Lori Le  Procedure(s) Performed: ARTHRODESIS; HALLUX/IP JOINT LEFT (Left Foot) PART EXCISION BONE-PHALANX 2,3,4 LEFT (Left Foot)  Patient location during evaluation: PACU Anesthesia Type: General Level of consciousness: awake and alert Pain management: pain level controlled Vital Signs Assessment: post-procedure vital signs reviewed and stable Respiratory status: spontaneous breathing, nonlabored ventilation, respiratory function stable and patient connected to nasal cannula oxygen Cardiovascular status: blood pressure returned to baseline and stable Postop Assessment: no apparent nausea or vomiting Anesthetic complications: no    Alisa Graff

## 2018-09-03 NOTE — Progress Notes (Signed)
After standing up to get dressed a scant amount of sanguinous drainage was noted on kerlix dressing around the toes, approx 10 mins later no new drainage was noted. Encouraged patient to elevate leg immediately upon arrival home and to contact Dr. Selina Cooley office if bandage becomes saturated with blood, patient and her mother verbalize understanding.

## 2018-09-03 NOTE — Discharge Instructions (Signed)
[address] [place]   1. Take your medication as prescribed.  Pain medication should be taken only as needed.  2. Keep the dressing clean, dry and intact.  3. Keep your foot elevated above the heart level for the first 48 hours.  4. Walking to the bathroom and brief periods of walking are acceptable, unless we have instructed you to be non-weight bearing.  5. Always wear your post-op shoe when walking.  Always use your crutches if you are to be non-weight bearing.  6. Do not take a shower. Baths are permissible as long as the foot is kept out of the water.   7. Every hour you are awake:  - Bend your knee 15 times. - Flex foot 15 times - Massage calf 15 times  8. Call [facility] [phone]) if any of the following problems occur: - You develop a temperature or fever. - The bandage becomes saturated with blood. - Medication does not stop your pain. - Injury of the foot occurs. - Any symptoms of infection including redness, odor, or red streaks running from wound.  Scopolamine skin patches REMOVE PATCH IN 72 HOURS AND Aneta HANDS IMMEDIATELY What is this medicine? SCOPOLAMINE (skoe POL a meen) is used to prevent nausea and vomiting caused by motion sickness, anesthesia and surgery. This medicine may be used for other purposes; ask your health care provider or pharmacist if you have questions. COMMON BRAND NAME(S): Transderm Scop What should I tell my health care provider before I take this medicine? They need to know if you have any of these conditions: -are scheduled to have a gastric secretion test -glaucoma -heart disease -kidney disease -liver disease -lung or breathing disease, like asthma -mental illness -prostate disease -seizures -stomach or intestine problems -trouble passing urine -an unusual or allergic reaction to  scopolamine, atropine, other medicines, foods, dyes, or preservatives -pregnant or trying to get pregnant -breast-feeding How should I use this medicine? This medicine is for external use only. Follow the directions on the prescription label. Wear only 1 patch at a time. Choose an area behind the ear, that is clean, dry, hairless and free from any cuts or irritation. Wipe the area with a clean dry tissue. Peel off the plastic backing of the skin patch, trying not to touch the adhesive side with your hands. Do not cut the patches. Firmly apply to the area you have chosen, with the metallic side of the patch to the skin and the tan-colored side showing. Once firmly in place, wash your hands well with soap and water. Do not get this medicine into your eyes. After removing the patch, wash your hands and the area behind your ear thoroughly with soap and water. The patch will still contain some medicine after use. To avoid accidental contact or ingestion by children or pets, fold the used patch in half with the sticky side together and throw away in the trash out of the reach of children and pets. If you need to use a second patch after you remove the first, place it behind the other ear. A special MedGuide will be given to you by the pharmacist with each prescription and refill. Be sure to read this information carefully each time. Talk to your pediatrician regarding the use of this medicine in children. Special care may be needed. Overdosage: If you think you have taken too much of this medicine contact  a poison control center or emergency room at once. NOTE: This medicine is only for you. Do not share this medicine with others. What if I miss a dose? This does not apply. This medicine is not for regular use. What may interact with this medicine? -alcohol -antihistamines for allergy cough and cold -atropine -certain medicines for anxiety or sleep -certain medicines for bladder problems like oxybutynin,  tolterodine -certain medicines for depression like amitriptyline, fluoxetine, sertraline -certain medicines for stomach problems like dicyclomine, hyoscyamine -certain medicines for Parkinson's disease like benztropine, trihexyphenidyl -certain medicines for seizures like phenobarbital, primidone -general anesthetics like halothane, isoflurane, methoxyflurane, propofol -ipratropium -local anesthetics like lidocaine, pramoxine, tetracaine -medicines that relax muscles for surgery -phenothiazines like chlorpromazine, mesoridazine, prochlorperazine, thioridazine -narcotic medicines for pain -other belladonna alkaloids This list may not describe all possible interactions. Give your health care provider a list of all the medicines, herbs, non-prescription drugs, or dietary supplements you use. Also tell them if you smoke, drink alcohol, or use illegal drugs. Some items may interact with your medicine. What should I watch for while using this medicine? Limit contact with water while swimming and bathing because the patch may fall off. If the patch falls off, throw it away and put a new one behind the other ear. You may get drowsy or dizzy. Do not drive, use machinery, or do anything that needs mental alertness until you know how this medicine affects you. Do not stand or sit up quickly, especially if you are an older patient. This reduces the risk of dizzy or fainting spells. Alcohol may interfere with the effect of this medicine. Avoid alcoholic drinks. Your mouth may get dry. Chewing sugarless gum or sucking hard candy, and drinking plenty of water may help. Contact your healthcare professional if the problem does not go away or is severe. This medicine may cause dry eyes and blurred vision. If you wear contact lenses, you may feel some discomfort. Lubricating drops may help. See your healthcare professional if the problem does not go away or is severe. If you are going to need surgery, an MRI, CT  scan, or other procedure, tell your healthcare professional that you are using this medicine. You may need to remove the patch before the procedure. What side effects may I notice from receiving this medicine? Side effects that you should report to your doctor or health care professional as soon as possible: -allergic reactions like skin rash, itching or hives; swelling of the face, lips, or tongue -blurred vision -changes in vision -confusion -dizziness -eye pain -fast, irregular heartbeat -hallucinations, loss of contact with reality -nausea, vomiting -pain or trouble passing urine -restlessness -seizures -skin irritation -stomach pain Side effects that usually do not require medical attention (report to your doctor or health care professional if they continue or are bothersome): -drowsiness -dry mouth -headache -sore throat This list may not describe all possible side effects. Call your doctor for medical advice about side effects. You may report side effects to FDA at 1-800-FDA-1088. Where should I keep my medicine? Keep out of the reach of children. Store at room temperature between 20 and 25 degrees C (68 and 77 degrees F). Keep this medicine in the foil package until ready to use. Throw away any unused medicine after the expiration date. NOTE: This sheet is a summary. It may not cover all possible information. If you have questions about this medicine, talk to your doctor, pharmacist, or health care provider.  2019 Elsevier/Gold Standard (2017-06-06 16:14:46)   General  Anesthesia, Adult, Care After This sheet gives you information about how to care for yourself after your procedure. Your health care provider may also give you more specific instructions. If you have problems or questions, contact your health care provider. What can I expect after the procedure? After the procedure, the following side effects are common:  Pain or discomfort at the IV  site.  Nausea.  Vomiting.  Sore throat.  Trouble concentrating.  Feeling cold or chills.  Weak or tired.  Sleepiness and fatigue.  Soreness and body aches. These side effects can affect parts of the body that were not involved in surgery. Follow these instructions at home:  For at least 24 hours after the procedure:  Have a responsible adult stay with you. It is important to have someone help care for you until you are awake and alert.  Rest as needed.  Do not: ? Participate in activities in which you could fall or become injured. ? Drive. ? Use heavy machinery. ? Drink alcohol. ? Take sleeping pills or medicines that cause drowsiness. ? Make important decisions or sign legal documents. ? Take care of children on your own. Eating and drinking  Follow any instructions from your health care provider about eating or drinking restrictions.  When you feel hungry, start by eating small amounts of foods that are soft and easy to digest (bland), such as toast. Gradually return to your regular diet.  Drink enough fluid to keep your urine pale yellow.  If you vomit, rehydrate by drinking water, juice, or clear broth. General instructions  If you have sleep apnea, surgery and certain medicines can increase your risk for breathing problems. Follow instructions from your health care provider about wearing your sleep device: ? Anytime you are sleeping, including during daytime naps. ? While taking prescription pain medicines, sleeping medicines, or medicines that make you drowsy.  Return to your normal activities as told by your health care provider. Ask your health care provider what activities are safe for you.  Take over-the-counter and prescription medicines only as told by your health care provider.  If you smoke, do not smoke without supervision.  Keep all follow-up visits as told by your health care provider. This is important. Contact a health care provider if:  You  have nausea or vomiting that does not get better with medicine.  You cannot eat or drink without vomiting.  You have pain that does not get better with medicine.  You are unable to pass urine.  You develop a skin rash.  You have a fever.  You have redness around your IV site that gets worse. Get help right away if:  You have difficulty breathing.  You have chest pain.  You have blood in your urine or stool, or you vomit blood. Summary  After the procedure, it is common to have a sore throat or nausea. It is also common to feel tired.  Have a responsible adult stay with you for the first 24 hours after general anesthesia. It is important to have someone help care for you until you are awake and alert.  When you feel hungry, start by eating small amounts of foods that are soft and easy to digest (bland), such as toast. Gradually return to your regular diet.  Drink enough fluid to keep your urine pale yellow.  Return to your normal activities as told by your health care provider. Ask your health care provider what activities are safe for you. This information is not  intended to replace advice given to you by your health care provider. Make sure you discuss any questions you have with your health care provider. Document Released: 06/24/2000 Document Revised: 11/01/2016 Document Reviewed: 11/01/2016 Elsevier Interactive Patient Education  2019 [employer].

## 2018-09-03 NOTE — Anesthesia Preprocedure Evaluation (Signed)
Anesthesia Evaluation  Patient identified by MRN, date of birth, ID band Patient awake    Reviewed: Allergy & Precautions, H&P , NPO status , Patient's Chart, lab work & pertinent test results, reviewed documented beta blocker date and time   History of Anesthesia Complications (+) PONV and history of anesthetic complications  Airway Mallampati: II  TM Distance: >3 FB Neck ROM: full    Dental no notable dental hx.    Pulmonary neg pulmonary ROS,    Pulmonary exam normal breath sounds clear to auscultation       Cardiovascular Exercise Tolerance: Good hypertension, negative cardio ROS   Rhythm:regular Rate:Normal     Neuro/Psych  Headaches, negative psych ROS   GI/Hepatic Neg liver ROS, PUD, GERD  ,  Endo/Other  Hypothyroidism   Renal/GU negative Renal ROS  negative genitourinary   Musculoskeletal   Abdominal   Peds  Hematology negative hematology ROS (+)   Anesthesia Other Findings   Reproductive/Obstetrics negative OB ROS                             Anesthesia Physical Anesthesia Plan  ASA: II  Anesthesia Plan: General   Post-op Pain Management:    Induction:   PONV Risk Score and Plan:   Airway Management Planned:   Additional Equipment:   Intra-op Plan:   Post-operative Plan:   Informed Consent: I have reviewed the patients History and Physical, chart, labs and discussed the procedure including the risks, benefits and alternatives for the proposed anesthesia with the patient or authorized representative who has indicated his/her understanding and acceptance.     Dental Advisory Given  Plan Discussed with: CRNA  Anesthesia Plan Comments:         Anesthesia Quick Evaluation

## 2018-09-03 NOTE — Op Note (Signed)
Operative note   Surgeon: Dr. Albertine Patricia, DPM.    Assistant: None    Preop diagnosis: 1.  Hallux malleus left foot 2.  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  Tight extensor tendon second toe left foot    Postop diagnosis: Same    Procedure:   1.  Hallux interphalangeal joint fusion with 4.0 K wire from [place] left foot   2.  Arthroplasty second third and fourth DIP joints with K wire fixation left foot   3.  Extensor tendon lengthening to the second toe at the MTP joint area left foot     EBL: Less than 5 cc    Anesthesia:general delivered by the anesthesia team.  I injected 0.25% Marcaine mixed with Exparel rel to the base of each of the toes and also around the second MTP joint.    Hemostasis: Ankle tourniquet 250 mils mercury pressure for 72 minutes    Specimen: None    Complications: None    Operative indications: Chronic pain unresponsive to conservative care on the left foot with multiple deformities.  History of previous surgery    Procedure:  Patient was brought into the OR and placed on the operating table in thesupine position. After anesthesia was obtained theleft lower extremity was prepped and draped in usual sterile fashion.  Operative Report: This time attention was directed to the left foot to the great toe initially where 2 similar incisions were made over the interphalangeal joint.  This lip skin was then removed extension was identified incised transversely reflected proximally off the proximal phalanx head and distally off the distal phalanx base.  This point a sagittal saw was used to remove the residual articular cartilage was noted arthritic changes to the joint.  Once the cartilage was removed and Rall bone was noted the area was drilled with a 2.0 drill bit and a wire from screw set was run through the distal phalanx and into the proximal phalanx while holding in a corrected position.  This was then checked FluoroScan good position correction  were noted.  This point a 4 oh cannulated screw was placed distally through the distal phalanx into the proximal phalanx along the course of the wire.  There is checked FluoroScan good position and correction were noted.  I also elected this time to put a secondary percutaneous point of 5 K wire from the medial distal phalanx and across the joint into the lateral proximal phalanx.  There is an copiously irrigated the area checked for skin good position correction were noted.  Incision was then closed with 3-0 Vicryl simple interrupted sutures.  Skin was closed with 4-0 nylon simple direct and horizontal mattress combination.  This time his direct to the second toe of the left foot where 2 semielliptical incisions were made transversely across the DIP joint.  Slip skin was then removed extension was then divided incised transversely reflected proximally.  This point the distal head of the middle phalanx was then removed with power equipment.  There was copiously irrigated the tendon sutured back with 4-0 Vicryl simple erupted sutures.  Skin was closed with 4-0 nylon simple erupted sutures as well.  A K wire had been run through the distal phalanx and rested back into the middle phalanx prior to closure.  All areas were copiously irrigated throughout the procedure.  This time attention directed to the third and fourth toes left foot where similar procedures were performed.  This time to direct to the  second metatarsal phalangeal joint where a 2 cm linear incision was made deepened sharp blunt dissection states of scar tissue was noted around the extensor tendon from previous surgery.  This was released and an extensor tendon lengthening was performed a Z-plasty technique.  There is an copiously irrigated the superficial fascial layers closed with 4-0 Vicryl continuous stitch and skin was closed with 4-0 Vicryl subcuticular fashion.  Sterile compressive dressings placed across wound this time consisting of  Steri-Strips Xeroform gauze 4 x 4's Kling and Kerlix.  A posterior splint was also placed on the left foot leg in the operating room.  Tourniquet had been released after applying the bandage and probably vascular nursing return to the digits of the left foot.    Patient tolerated the procedure and anesthesia well.  Was transported from the OR to the PACU with all vital signs stable and vascular status intact. To be discharged per routine protocol.  Will follow up in approximately 1 week in the outpatient clinic.

## 2018-09-03 NOTE — Anesthesia Procedure Notes (Signed)
Procedure Name: LMA Insertion Date/Time: 09/03/2018 7:39 AM Performed by: Janna Arch, CRNA Pre-anesthesia Checklist: Patient identified, Emergency Drugs available, Suction available, Timeout performed and Patient being monitored Patient Re-evaluated:Patient Re-evaluated prior to induction Oxygen Delivery Method: Circle system utilized Preoxygenation: Pre-oxygenation with 100% oxygen Induction Type: IV induction LMA: LMA inserted LMA Size: 4.0 Number of attempts: 1 Placement Confirmation: positive ETCO2 and breath sounds checked- equal and bilateral Tube secured with: Tape

## 2018-09-03 NOTE — H&P (Signed)
H and P has been reviewed and no changes are noted.  

## 2018-09-03 NOTE — Transfer of Care (Signed)
Immediate Anesthesia Transfer of Care Note  Patient: Lori Le  Procedure(s) Performed: ARTHRODESIS; HALLUX/IP JOINT LEFT (Left Foot) PART EXCISION BONE-PHALANX 2,3,4 LEFT (Left Foot)  Patient Location: PACU  Anesthesia Type: General  Level of Consciousness: awake, alert  and patient cooperative  Airway and Oxygen Therapy: Patient Spontanous Breathing and Patient connected to supplemental oxygen  Post-op Assessment: Post-op Vital signs reviewed, Patient's Cardiovascular Status Stable, Respiratory Function Stable, Patent Airway and No signs of Nausea or vomiting  Post-op Vital Signs: Reviewed and stable  Complications: No apparent anesthesia complications

## 2018-09-04 ENCOUNTER — Encounter: Payer: Self-pay | Admitting: Podiatry

## 2018-09-04 ENCOUNTER — Other Ambulatory Visit: Payer: Self-pay

## 2018-09-04 ENCOUNTER — Other Ambulatory Visit
Admit: 2018-09-04 | Discharge: 2018-09-04 | Disposition: A | Discharge status: Home / Self Care | Payer: Managed Care, Other (non HMO) | Attending: Gastroenterology | Admitting: Gastroenterology

## 2018-09-04 DIAGNOSIS — Z01812 Encounter for preprocedural laboratory examination: Secondary | ICD-10-CM | POA: Diagnosis present

## 2018-09-04 DIAGNOSIS — Z1159 Encounter for screening for other viral diseases: Secondary | ICD-10-CM | POA: Insufficient documentation

## 2018-09-05 LAB — NOVEL CORONAVIRUS, NAA (HOSP ORDER, SEND-OUT TO REF LAB; TAT 18-24 HRS): SARS-CoV-2, NAA: NOT DETECTED

## 2018-09-07 DIAGNOSIS — R200 Anesthesia of skin: Secondary | ICD-10-CM | POA: Insufficient documentation

## 2018-09-08 ENCOUNTER — Other Ambulatory Visit: Payer: Self-pay

## 2018-09-08 ENCOUNTER — Ambulatory Visit: Payer: Managed Care, Other (non HMO) | Admitting: Anesthesiology

## 2018-09-08 ENCOUNTER — Ambulatory Visit
Admission: RE | Admit: 2018-09-08 | Discharge: 2018-09-08 | Disposition: A | Discharge status: Home / Self Care | Payer: Managed Care, Other (non HMO) | Attending: Gastroenterology | Admitting: Gastroenterology

## 2018-09-08 ENCOUNTER — Encounter
Admission: RE | Disposition: A | Discharge status: Home / Self Care | Payer: Self-pay | Source: Home / Self Care | Attending: Gastroenterology

## 2018-09-08 ENCOUNTER — Other Ambulatory Visit: Payer: Managed Care, Other (non HMO)

## 2018-09-08 DIAGNOSIS — Z8711 Personal history of peptic ulcer disease: Secondary | ICD-10-CM | POA: Diagnosis not present

## 2018-09-08 DIAGNOSIS — M1990 Unspecified osteoarthritis, unspecified site: Secondary | ICD-10-CM | POA: Diagnosis not present

## 2018-09-08 DIAGNOSIS — Z7984 Long term (current) use of oral hypoglycemic drugs: Secondary | ICD-10-CM | POA: Diagnosis not present

## 2018-09-08 DIAGNOSIS — K317 Polyp of stomach and duodenum: Secondary | ICD-10-CM | POA: Insufficient documentation

## 2018-09-08 DIAGNOSIS — Z980 Intestinal bypass and anastomosis status: Secondary | ICD-10-CM | POA: Insufficient documentation

## 2018-09-08 DIAGNOSIS — E039 Hypothyroidism, unspecified: Secondary | ICD-10-CM | POA: Insufficient documentation

## 2018-09-08 DIAGNOSIS — Z888 Allergy status to other drugs, medicaments and biological substances status: Secondary | ICD-10-CM | POA: Insufficient documentation

## 2018-09-08 DIAGNOSIS — K228 Other specified diseases of esophagus: Secondary | ICD-10-CM | POA: Diagnosis not present

## 2018-09-08 DIAGNOSIS — Z96652 Presence of left artificial knee joint: Secondary | ICD-10-CM | POA: Insufficient documentation

## 2018-09-08 DIAGNOSIS — Z79899 Other long term (current) drug therapy: Secondary | ICD-10-CM | POA: Insufficient documentation

## 2018-09-08 DIAGNOSIS — Z881 Allergy status to other antibiotic agents status: Secondary | ICD-10-CM | POA: Insufficient documentation

## 2018-09-08 DIAGNOSIS — Z96642 Presence of left artificial hip joint: Secondary | ICD-10-CM | POA: Diagnosis not present

## 2018-09-08 DIAGNOSIS — E755 Other lipid storage disorders: Secondary | ICD-10-CM | POA: Insufficient documentation

## 2018-09-08 DIAGNOSIS — Z79890 Hormone replacement therapy (postmenopausal): Secondary | ICD-10-CM | POA: Diagnosis not present

## 2018-09-08 DIAGNOSIS — E785 Hyperlipidemia, unspecified: Secondary | ICD-10-CM | POA: Insufficient documentation

## 2018-09-08 DIAGNOSIS — G629 Polyneuropathy, unspecified: Secondary | ICD-10-CM | POA: Insufficient documentation

## 2018-09-08 DIAGNOSIS — Z09 Encounter for follow-up examination after completed treatment for conditions other than malignant neoplasm: Secondary | ICD-10-CM | POA: Insufficient documentation

## 2018-09-08 DIAGNOSIS — K283 Acute gastrojejunal ulcer without hemorrhage or perforation: Secondary | ICD-10-CM | POA: Diagnosis not present

## 2018-09-08 DIAGNOSIS — K2289 Other specified disease of esophagus: Secondary | ICD-10-CM

## 2018-09-08 DIAGNOSIS — I10 Essential (primary) hypertension: Secondary | ICD-10-CM | POA: Diagnosis not present

## 2018-09-08 DIAGNOSIS — M109 Gout, unspecified: Secondary | ICD-10-CM | POA: Diagnosis not present

## 2018-09-08 DIAGNOSIS — K210 Gastro-esophageal reflux disease with esophagitis: Secondary | ICD-10-CM | POA: Diagnosis not present

## 2018-09-08 HISTORY — PX: POLYPECTOMY: SHX5525

## 2018-09-08 HISTORY — PX: ESOPHAGOGASTRODUODENOSCOPY (EGD) WITH PROPOFOL: SHX5813

## 2018-09-08 LAB — GLUCOSE, CAPILLARY: Glucose-Capillary: 100 mg/dL — ABNORMAL HIGH (ref 70–99)

## 2018-09-08 SURGERY — ESOPHAGOGASTRODUODENOSCOPY (EGD) WITH PROPOFOL
Anesthesia: Monitor Anesthesia Care | Site: Esophagus

## 2018-09-08 MED ORDER — LIDOCAINE HCL (CARDIAC) PF 100 MG/5ML IV SOSY
PREFILLED_SYRINGE | INTRAVENOUS | Status: DC | PRN
  Administered 2018-09-08: 30 mg via INTRAVENOUS

## 2018-09-08 MED ORDER — GLYCOPYRROLATE 0.2 MG/ML IJ SOLN
INTRAMUSCULAR | Status: DC | PRN
  Administered 2018-09-08: 0.1 mg via INTRAVENOUS

## 2018-09-08 MED ORDER — PANTOPRAZOLE SODIUM 40 MG PO TBEC: 40.0000 mg | DELAYED_RELEASE_TABLET | Freq: Every day | ORAL | 0 refills | Status: DC

## 2018-09-08 MED ORDER — LACTATED RINGERS IV SOLN
INTRAVENOUS | Status: DC | PRN
  Administered 2018-09-08: 11:00:00 via INTRAVENOUS

## 2018-09-08 MED ORDER — STERILE WATER FOR IRRIGATION IR SOLN
Status: DC | PRN
  Administered 2018-09-08: 11:00:00

## 2018-09-08 MED ORDER — PROPOFOL 10 MG/ML IV BOLUS
INTRAVENOUS | Status: DC | PRN
  Administered 2018-09-08: 100 mg via INTRAVENOUS
  Administered 2018-09-08: 40 mg via INTRAVENOUS
  Administered 2018-09-08: 50 mg via INTRAVENOUS
  Administered 2018-09-08: 30 mg via INTRAVENOUS
  Administered 2018-09-08: 50 mg via INTRAVENOUS

## 2018-09-08 SURGICAL SUPPLY — 7 items
BLOCK BITE 60FR ADLT L/F GRN (MISCELLANEOUS) ×4 IMPLANT
CANISTER SUCT 1200ML W/VALVE (MISCELLANEOUS) ×4 IMPLANT
FORCEPS BIOP RAD 4 LRG CAP 4 (CUTTING FORCEPS) ×2 IMPLANT
GOWN CVR UNV OPN BCK APRN NK (MISCELLANEOUS) ×4 IMPLANT
GOWN ISOL THUMB LOOP REG UNIV (MISCELLANEOUS) ×8
KIT ENDO PROCEDURE OLY (KITS) ×4 IMPLANT
WATER STERILE IRR 250ML POUR (IV SOLUTION) ×4 IMPLANT

## 2018-09-08 NOTE — Anesthesia Postprocedure Evaluation (Signed)
Anesthesia Post Note  Patient: Lori Le  Procedure(s) Performed: ESOPHAGOGASTRODUODENOSCOPY (EGD) WITH BIOPSY (N/A Esophagus) POLYPECTOMY (N/A )  Patient location during evaluation: PACU Anesthesia Type: General Level of consciousness: awake and alert Pain management: pain level controlled Vital Signs Assessment: post-procedure vital signs reviewed and stable Respiratory status: spontaneous breathing, nonlabored ventilation, respiratory function stable and patient connected to nasal cannula oxygen Cardiovascular status: blood pressure returned to baseline and stable Postop Assessment: no apparent nausea or vomiting Anesthetic complications: no    Donavan Kerlin ELAINE

## 2018-09-08 NOTE — Anesthesia Preprocedure Evaluation (Addendum)
Anesthesia Evaluation  Patient identified by MRN, date of birth, ID band Patient awake    Reviewed: Allergy & Precautions, H&P , NPO status , Patient's Chart, lab work & pertinent test results, reviewed documented beta blocker date and time   History of Anesthesia Complications (+) PONV and history of anesthetic complications  Airway Mallampati: II  TM Distance: >3 FB Neck ROM: full    Dental no notable dental hx.    Pulmonary neg pulmonary ROS,    Pulmonary exam normal breath sounds clear to auscultation       Cardiovascular Exercise Tolerance: Good hypertension, negative cardio ROS   Rhythm:regular Rate:Normal     Neuro/Psych  Headaches, negative psych ROS   GI/Hepatic Neg liver ROS, PUD, GERD  ,  Endo/Other  Hypothyroidism   Renal/GU negative Renal ROS  negative genitourinary   Musculoskeletal   Abdominal   Peds  Hematology negative hematology ROS (+)   Anesthesia Other Findings   Reproductive/Obstetrics negative OB ROS                             Anesthesia Physical  Anesthesia Plan  ASA: II  Anesthesia Plan: General   Post-op Pain Management:    Induction:   PONV Risk Score and Plan:   Airway Management Planned:   Additional Equipment:   Intra-op Plan:   Post-operative Plan:   Informed Consent: I have reviewed the patients History and Physical, chart, labs and discussed the procedure including the risks, benefits and alternatives for the proposed anesthesia with the patient or authorized representative who has indicated his/her understanding and acceptance.     Dental Advisory Given  Plan Discussed with: CRNA  Anesthesia Plan Comments:       Anesthesia Quick Evaluation

## 2018-09-08 NOTE — Transfer of Care (Signed)
Immediate Anesthesia Transfer of Care Note  Patient: Lori Le  Procedure(s) Performed: ESOPHAGOGASTRODUODENOSCOPY (EGD) WITH BIOPSY (N/A Esophagus)  Patient Location: PACU  Anesthesia Type: MAC  Level of Consciousness: awake, alert  and patient cooperative  Airway and Oxygen Therapy: Patient Spontanous Breathing and Patient connected to supplemental oxygen  Post-op Assessment: Post-op Vital signs reviewed, Patient's Cardiovascular Status Stable, Respiratory Function Stable, Patent Airway and No signs of Nausea or vomiting  Post-op Vital Signs: Reviewed and stable  Complications: No apparent anesthesia complications

## 2018-09-08 NOTE — Op Note (Signed)
[facility] Gastroenterology Patient Name: Lori Le Procedure Date: 09/08/2018 11:08 AM MRN: [id number] Account #: [id number] Date of Birth: 01/21/1966 Admit Type: Outpatient Age: 53 Room: [facility] OR ROOM 01 Gender: Female Note Status: Finalized Procedure:            Upper GI endoscopy Indications:          Follow-up of acute gastrojejunal ulcer Providers:            Anjalee Cope B. Bonna Gains MD, MD Referring MD:         Einar Pheasant, MD (Referring MD) Medicines:            Monitored Anesthesia Care Complications:        No immediate complications. Procedure:            Pre-Anesthesia Assessment:                       - The risks and benefits of the procedure and the                        sedation options and risks were discussed with the                        patient. All questions were answered and informed                        consent was obtained.                       - Patient identification and proposed procedure were                        verified prior to the procedure.                       - ASA Grade Assessment: II - A patient with mild                        systemic disease.                       After obtaining informed consent, the endoscope was                        passed under direct vision. Throughout the procedure,                        the patient's blood pressure, pulse, and oxygen                        saturations were monitored continuously. The                        Endosonoscope was introduced through the mouth, and                        advanced to the jejunum. The upper GI endoscopy was                        accomplished with ease. The patient tolerated the  procedure well. Findings:      Islands of salmon-colored mucosa were present. No other visible       abnormalities were present. The maximum longitudinal extent of these       esophageal mucosal changes was 1 cm in length. Biopsies were taken with      a cold forceps for histology. Mucosa was biopsied with a cold forceps       for histology in a targeted manner and in 4 quadrants. Only 1 island was       present      A few 2 to 4 mm sessile polyps with no bleeding and no stigmata of       recent bleeding were found in the stomach. Biopsies were taken with a       cold forceps for histology.      Evidence of a Roux-en-Y gastrojejunostomy was found. The gastrojejunal       anastomosis was characterized by healthy appearing mucosa and an intact       appearance. This was traversed. The pouch-to-jejunum limb was       characterized by healthy appearing mucosa and the presence of no stomal       ulceration.      The examined jejunum was normal. Impression:           - Salmon-colored mucosa suspicious for short-segment                        Barrett's esophagus. Biopsied.                       - A few gastric polyps. Biopsied.                       - Roux-en-Y gastrojejunostomy with gastrojejunal                        anastomosis characterized by healthy appearing mucosa                        and an intact appearance.                       - Normal examined jejunum. Recommendation:       - Decrease PPI to once daily                       - Follow an antireflux regimen.                       - Await pathology results.                       - Return to my office in 4 weeks.                       - Continue present medications.                       - The findings and recommendations were discussed with                        the patient.                       -  The findings and recommendations were discussed with                        the patient's family. Procedure Code(s):    --- Professional ---                       [phone], Esophagogastroduodenoscopy, flexible, transoral;                        with biopsy, single or multiple Diagnosis Code(s):    --- Professional ---                       K22.8, Other specified diseases of  esophagus                       K31.7, Polyp of stomach and duodenum                       Z98.0, Intestinal bypass and anastomosis status                       K28.3, Acute gastrojejunal ulcer without hemorrhage or                        perforation CPT copyright 2019 [employer] Association. All rights reserved. The codes documented in this report are preliminary and upon coder review may  be revised to meet current compliance requirements.  Vonda Antigua, MD Margretta Sidle B. Bonna Gains MD, MD 09/08/2018 11:30:54 AM This report has been signed electronically. Number of Addenda: 0 Note Initiated On: 09/08/2018 11:08 AM Estimated Blood Loss: Estimated blood loss: none.      [facility]

## 2018-09-08 NOTE — H&P (Signed)
Vonda Antigua, MD [address] [place], [place], [place], [address] [address], [place], [place], [place], [address] Phone: [phone]  Fax: [phone]  Primary Care Physician:  Einar Pheasant, MD   Pre-Procedure History & Physical: HPI:  LATECIA Le is a 53 y.o. female is here for an EGD.   Past Medical History:  Diagnosis Date  . Allergic rhinitis due to allergen   . Allergy   . Anemia   . Anxiety   . Arthritis   . B12 deficiency   . Cancer ([place])    skin ca  . Chronic idiopathic urticaria   . Complication of anesthesia    nausea, slow to wake up  . Depression   . Edema   . Family history of adverse reaction to anesthesia    Father - PONV  . Gastric ulcer   . GERD (gastroesophageal reflux disease)   . Gout   . Gout   . Hashimoto's thyroiditis   . Heart murmur   . History of eating disorder   . Hives   . Hyperlipidemia   . Hypertension   . Hypothyroidism   . Lower extremity edema   . Melanoma in situ ([place])    left shoulder  . Migraine    approx 4x/yr  . Mitral valve disorder   . Motion sickness    all moving vehicles  . Neuropathy    fingers and feet  . PCOS (polycystic ovarian syndrome)   . PONV (postoperative nausea and vomiting)   . Thyroid disease   . Ulcer   . Vertigo    last episode over 1 yr ago    Past Surgical History:  Procedure Laterality Date  . BONE EXCISION Left 09/03/2018   Procedure: PART EXCISION BONE-PHALANX 2,3,4 LEFT;  Surgeon: Albertine Patricia, DPM;  Location: [facility];  Service: Podiatry;  Laterality: Left;  . BREAST BIOPSY Right 2007   neg- bx/clip  . CHOLECYSTECTOMY  2003  . COLONOSCOPY WITH PROPOFOL N/A 05/11/2018   Procedure: COLONOSCOPY WITH PROPOFOL;  Surgeon: [name], MD;  Location: [facility] ENDOSCOPY;  Service: Endoscopy;  Laterality: N/A;  . DILITATION & CURRETTAGE/HYSTROSCOPY WITH NOVASURE ABLATION N/A 08/19/2016   Procedure: DILATATION & CURETTAGE/HYSTEROSCOPY WITH NOVASURE ABLATION;   Surgeon: [name], MD;  Location: [facility] ORS;  Service: Gynecology;  Laterality: N/A;  . ESOPHAGOGASTRODUODENOSCOPY (EGD) WITH PROPOFOL N/A 12/05/2015   Procedure: ESOPHAGOGASTRODUODENOSCOPY (EGD) WITH PROPOFOL;  Surgeon: Lollie Sails, MD;  Location: [facility] ENDOSCOPY;  Service: Endoscopy;  Laterality: N/A;  . ESOPHAGOGASTRODUODENOSCOPY (EGD) WITH PROPOFOL N/A 02/06/2016   Procedure: ESOPHAGOGASTRODUODENOSCOPY (EGD) WITH PROPOFOL;  Surgeon: Lollie Sails, MD;  Location: [facility] ENDOSCOPY;  Service: Endoscopy;  Laterality: N/A;  . ESOPHAGOGASTRODUODENOSCOPY (EGD) WITH PROPOFOL N/A 05/11/2018   Procedure: ESOPHAGOGASTRODUODENOSCOPY (EGD) WITH PROPOFOL;  Surgeon: [name], MD;  Location: [facility] ENDOSCOPY;  Service: Endoscopy;  Laterality: N/A;  . FOOT ARTHRODESIS Left 09/03/2018   Procedure: ARTHRODESIS; HALLUX/IP JOINT LEFT;  Surgeon: Albertine Patricia, DPM;  Location: [facility];  Service: Podiatry;  Laterality: Left;  LMA LOCAL  . FOOT SURGERY  1994  . GASTRIC BYPASS  2011  . JOINT REPLACEMENT Left 09/27/2014   HIP  . JOINT REPLACEMENT Left 12/01/2014   KNEE  . KNEE ARTHROSCOPY Right   . KNEE ARTHROSCOPY Left 03/23/2015   Procedure: ARTHROSCOPY KNEE, partial synovectomy;  Surgeon: Hessie Knows, MD;  Location: [facility] ORS;  Service: Orthopedics;  Laterality: Left;  . LAPAROSCOPIC GASTRIC BANDING  2010  . ROTATOR CUFF  REPAIR Bilateral    right shoulder 07-26-2016  . SHOULDER ARTHROSCOPY Left 06/20/2015   Procedure: ARTHROSCOPY SHOULDER, REPAIR OF MASSIVE ROTATOR CUFF TEAR, TENODESIS, DECOMPRESSION DEBRIDEMENT;  Surgeon: Corky Mull, MD;  Location: [facility] ORS;  Service: Orthopedics;  Laterality: Left;  . TOTAL HIP ARTHROPLASTY Left 09/27/2014   Procedure: TOTAL HIP ARTHROPLASTY ANTERIOR APPROACH;  Surgeon: Hessie Knows, MD;  Location: [facility] ORS;  Service: Orthopedics;  Laterality: Left;  . TOTAL KNEE ARTHROPLASTY Left 12/01/2014   Procedure: TOTAL KNEE ARTHROPLASTY;  Surgeon: Hessie Knows, MD;  Location: [facility] ORS;  Service: Orthopedics;  Laterality: Left;    Prior to Admission medications   Medication Sig Start Date End Date Taking? Authorizing Provider  allopurinol (ZYLOPRIM) 300 MG tablet TAKE 1 TABLET BY MOUTH EVERY DAY 04/22/18  Yes Einar Pheasant, MD  cyanocobalamin (,VITAMIN B-12,) 1000 MCG/ML injection ADM 1 ML IM Q 30 DAYS 08/07/18  Yes [provider]  Dupilumab ([place]) 300 MG/2ML SOSY  08/12/18  Yes [provider]  furosemide (LASIX) 40 MG tablet Take 1 tablet (40 mg total) by mouth daily. 07/11/18  Yes Einar Pheasant, MD  gabapentin (NEURONTIN) 100 MG capsule Take 200 mg by mouth 2 (two) times daily. 04/20/18  Yes [provider]  HYDROcodone-acetaminophen (NORCO) 7.5-325 MG tablet Take 1 tablet by mouth every 6 (six) hours as needed for moderate pain. 09/03/18  Yes Troxler, Rodman Key, DPM  levothyroxine (SYNTHROID) 137 MCG tablet TK 1 T PO QD 30 TO 60 MIN B BRE OES AND WITH A GLASS OF WATER 06/19/18  Yes [provider]  linaclotide (LINZESS) 145 MCG CAPS capsule Take 1 capsule (145 mcg total) by mouth daily before breakfast for 30 days. 09/01/18 10/01/18 Yes [name], MD  metFORMIN (GLUCOPHAGE) 500 MG tablet Take 1 tablet (500 mg total) by mouth daily with breakfast. 08/26/18  Yes Eber Jones, MD  metolazone (ZAROXOLYN) 5 MG tablet Take 5 mg by mouth daily as needed.    Yes [provider]  montelukast (SINGULAIR) 10 MG tablet Take 10 mg by mouth at bedtime.   Yes [provider]  pantoprazole (PROTONIX) 40 MG tablet Take 1 tablet (40 mg total) by mouth 2 (two) times daily before a meal. 07/28/18  Yes Coraima Tibbs, Margretta Sidle B, MD  potassium chloride SA (K-DUR,KLOR-CON) 20 MEQ tablet Take two tablets (40 meq) by mouth daily in the morning and 1 tablet (20 meq) by mouth daily in the evening 07/13/18  Yes Einar Pheasant, MD  sertraline (ZOLOFT) 50 MG tablet Take 1.5 tablets (75 mg total) by mouth daily.  08/17/18 08/17/19 Yes Arfeen, Arlyce Harman, MD  triamterene-hydrochlorothiazide (MAXZIDE-25) 37.5-25 MG tablet TAKE 1 TABLET BY MOUTH EVERY DAY 07/09/18  Yes Einar Pheasant, MD  cephALEXin (KEFLEX) 500 MG capsule Take 1 capsule (500 mg total) by mouth 3 (three) times daily. Patient not taking: Reported on 09/08/2018 08/25/18   Harvest Dark, MD  polyethylene glycol powder (GLYCOLAX/MIRALAX) 17 GM/SCOOP powder Take 17 g by mouth daily as needed for moderate constipation. 08/25/18   Harvest Dark, MD  triamcinolone (KENALOG) 0.025 % cream APP EXT AA BID 08/17/18   [provider]    Allergies as of 09/01/2018 - Review Complete 09/01/2018  Allergen Reaction Noted  . Contrast media [iodinated diagnostic agents] Anaphylaxis 05/24/2017  . Etodolac Anaphylaxis 02/05/2017  . Alpha-gal  08/27/2018  . Influenza vaccines Hives 12/09/2013  . Lactose Nausea And Vomiting 04/25/2015  . Lactose intolerance (gi) Nausea And Vomiting 09/27/2014  . Bacitracin-neomycin-polymyxin Rash  04/25/2015  . Neomycin-bacitracin zn-polymyx Rash   . Neosporin + pain relief max st [neomy-bacit-polymyx-pramoxine] Rash 07/31/2012    Family History  Problem Relation Age of Onset  . Arthritis Mother   . Hyperlipidemia Mother   . Hypertension Mother   . Diabetes Mother   . Sleep apnea Mother   . Obesity Mother   . Arthritis Father   . Hyperlipidemia Father   . Hypertension Father   . Mental illness Father   . Diabetes Father   . Obesity Father   . Sleep apnea Father   . Bipolar disorder Father   . Anxiety disorder Father   . Depression Father   . Cancer Father   . Kidney disease Father   . Arthritis Maternal Grandmother   . Cancer Maternal Grandmother        breast cancer  . Hyperlipidemia Maternal Grandmother   . Hypertension Maternal Grandmother   . Breast cancer Maternal Grandmother   . Arthritis Maternal Grandfather   . Hyperlipidemia Maternal Grandfather   . Hypertension Maternal Grandfather   .  Heart disease Maternal Grandfather        heart attack  . Breast cancer Paternal Grandmother     Social History   Socioeconomic History  . Marital status: Divorced    Spouse name: Not on file  . Number of children: Not on file  . Years of education: Not on file  . Highest education level: Not on file  Occupational History  . Occupation: Chiropractor  Social Needs  . Financial resource strain: Not on file  . Food insecurity:    Worry: Not on file    Inability: Not on file  . Transportation needs:    Medical: Not on file    Non-medical: Not on file  Tobacco Use  . Smoking status: Never Smoker  . Smokeless tobacco: Never Used  Substance and Sexual Activity  . Alcohol use: Yes    Alcohol/week: 0.0 - 1.0 standard drinks    Comment: occ.  . Drug use: No  . Sexual activity: Yes    Partners: Male    Birth control/protection: Surgical    Comment: Ablation   Lifestyle  . Physical activity:    Days per week: Not on file    Minutes per session: Not on file  . Stress: Not on file  Relationships  . Social connections:    Talks on phone: Not on file    Gets together: Not on file    Attends religious service: Not on file    Active member of club or organization: Not on file    Attends meetings of clubs or organizations: Not on file    Relationship status: Not on file  . Intimate partner violence:    Fear of current or ex partner: Not on file    Emotionally abused: Not on file    Physically abused: Not on file    Forced sexual activity: Not on file  Other Topics Concern  . Not on file  Social History Narrative  . Not on file    Review of Systems: See HPI, otherwise negative ROS  Physical Exam: BP 121/63   Pulse 65   Temp 97.8 F (36.6 C)   Ht 5\' 7"  (1.702 m)   Wt 105.2 kg   SpO2 97%   BMI 36.34 kg/m  General:   Alert,  pleasant and cooperative in NAD Head:  Normocephalic and atraumatic. Neck:  Supple; no masses or thyromegaly. Lungs:  Clear  throughout to  auscultation, normal respiratory effort.    Heart:  +S1, +S2, Regular rate and rhythm, No edema. Abdomen:  Soft, nontender and nondistended. Normal bowel sounds, without guarding, and without rebound.   Neurologic:  Alert and  oriented x4;  grossly normal neurologically.  Impression/Plan: COPELAND LAPIER is here for an EGD for gastric ulcer .  Risks, benefits, limitations, and alternatives regarding the procedure have been reviewed with the patient.  Questions have been answered.  All parties agreeable.   [name], MD  09/08/2018, 10:26 AM

## 2018-09-08 NOTE — Anesthesia Procedure Notes (Signed)
Date/Time: 09/08/2018 11:13 AM Performed by: Cameron Ali, CRNA Pre-anesthesia Checklist: Patient identified, Emergency Drugs available, Suction available, Timeout performed and Patient being monitored Patient Re-evaluated:Patient Re-evaluated prior to induction Oxygen Delivery Method: Nasal cannula Placement Confirmation: positive ETCO2

## 2018-09-09 ENCOUNTER — Encounter: Payer: Self-pay | Admitting: Gastroenterology

## 2018-09-10 ENCOUNTER — Encounter (INDEPENDENT_AMBULATORY_CARE_PROVIDER_SITE_OTHER): Payer: Self-pay | Admitting: Family Medicine

## 2018-09-10 ENCOUNTER — Ambulatory Visit (INDEPENDENT_AMBULATORY_CARE_PROVIDER_SITE_OTHER): Payer: Managed Care, Other (non HMO) | Admitting: Family Medicine

## 2018-09-10 ENCOUNTER — Other Ambulatory Visit: Payer: Self-pay

## 2018-09-10 DIAGNOSIS — E669 Obesity, unspecified: Secondary | ICD-10-CM

## 2018-09-10 DIAGNOSIS — R7303 Prediabetes: Secondary | ICD-10-CM | POA: Diagnosis not present

## 2018-09-10 DIAGNOSIS — E7849 Other hyperlipidemia: Secondary | ICD-10-CM

## 2018-09-10 DIAGNOSIS — Z6834 Body mass index (BMI) 34.0-34.9, adult: Secondary | ICD-10-CM | POA: Diagnosis not present

## 2018-09-10 LAB — SURGICAL PATHOLOGY

## 2018-09-10 MED ORDER — METFORMIN HCL 500 MG PO TABS: 500.0000 mg | ORAL_TABLET | Freq: Every day | ORAL | 0 refills | Status: DC

## 2018-09-10 NOTE — Progress Notes (Signed)
Office: [phone]  /  Fax: [phone] TeleHealth Visit:  Lori Le has verbally consented to this TeleHealth visit today. The patient is located at home, the provider is located at the [facility] office. The participants in this visit include the listed provider and patient. The visit was conducted today via webex.  HPI:   Chief Complaint: OBESITY Lori Le is here to discuss her progress with her obesity treatment plan. She is on the Category 2 plan and is following her eating plan approximately 60 % of the time. She states she is exercising 0 minutes 0 times per week. Lori Le is not weighing herself at home. She found out she may always be allowed to work from home. She has had to settle with eating what was brought to her easier focus like ramen. Her physical activity is limited to getting to the bathroom and back.  We were unable to weigh the patient today for this TeleHealth visit. She feels as if she has gained 1-2 lbs since her last visit. She has lost 2 lbs since starting treatment with [name].  Pre-Diabetes Lori Le has a diagnosis of pre-diabetes based on her elevated Hgb A1c of 5.8 on 05/26/2018, and was informed this puts her at greater risk of developing diabetes. She is taking metformin currently and denies cravings. She continues to work on diet and exercise to decrease risk of diabetes. She denies nausea or hypoglycemia.  Hyperlipidemia Lori Le has hyperlipidemia and has been trying to improve her cholesterol levels with intensive lifestyle modification including a low saturated fat diet, exercise and weight loss. Last LDL was of 106 on 05/26/2018. She is not on statin and denies any chest pain, claudication or myalgias.  ASSESSMENT AND PLAN:  Prediabetes - Plan: metFORMIN (GLUCOPHAGE) 500 MG tablet  Other hyperlipidemia  Class 1 obesity with serious comorbidity and body mass index (BMI) of 34.0 to 34.9 in adult, unspecified obesity type  PLAN:  Pre-Diabetes Lori Le  will continue to work on weight loss, exercise, and decreasing simple carbohydrates in her diet to help decrease the risk of diabetes. We dicussed metformin including benefits and risks. She was informed that eating too many simple carbohydrates or too many calories at one sitting increases the likelihood of GI side effects. Lori Le agrees to continue taking metformin 500 mg PO q AM #30 and we will refill for 1 month. Lori Le agrees to follow up with our clinic in 2 weeks as directed to monitor her progress.  Hyperlipidemia Lori Le was informed of the American Heart Association Guidelines emphasizing intensive lifestyle modifications as the first line treatment for hyperlipidemia. We discussed many lifestyle modifications today in depth, and Lori Le will continue to work on decreasing saturated fats such as fatty red meat, butter and many fried foods. She will also increase vegetables and lean protein in her diet and continue to work on exercise and weight loss efforts. We will repeat labs at her first in person appointment. Lori Le agrees to follow up with our clinic in 2 weeks.  Obesity Lori Le is currently in the action stage of change. As such, her goal is to continue with weight loss efforts She has agreed to keep a food journal with 1150-1300 calories and 80+ grams of protein daily or follow the Category 2 plan Lori Le has been instructed to work up to a goal of 150 minutes of combined cardio and strengthening exercise per week for weight loss and overall health benefits. We discussed the following Behavioral Modification Strategies today: increasing lean protein  intake, increasing vegetables and work on meal planning and easy cooking plans, keeping healthy foods in the home, better snacking choices, and planning for success   Lori Le has agreed to follow up with our clinic in 2 weeks. She was informed of the importance of frequent follow up visits to maximize her success with intensive lifestyle modifications for her  multiple health conditions.  ALLERGIES: Allergies  Allergen Reactions  . Contrast Media [Iodinated Diagnostic Agents] Anaphylaxis    MRI dye  . Etodolac Anaphylaxis  . Alpha-Gal     Flu- like symptoms  . Influenza Vaccines Hives  . Lactose Nausea And Vomiting    Can have yogurt Can not have milk and ice cream   . Lactose Intolerance (Gi) Nausea And Vomiting    Can have yogurt Can not have milk and ice cream    . Bacitracin-Neomycin-Polymyxin Rash  . Neomycin-Bacitracin Zn-Polymyx Rash  . Neosporin + Pain Relief Max St [Neomy-Bacit-Polymyx-Pramoxine] Rash    MEDICATIONS: Current Outpatient Medications on File Prior to Visit  Medication Sig Dispense Refill  . allopurinol (ZYLOPRIM) 300 MG tablet TAKE 1 TABLET BY MOUTH EVERY DAY 90 tablet 2  . cephALEXin (KEFLEX) 500 MG capsule Take 1 capsule (500 mg total) by mouth 3 (three) times daily. (Patient not taking: Reported on 09/08/2018) 21 capsule 0  . cyanocobalamin (,VITAMIN B-12,) 1000 MCG/ML injection ADM 1 ML IM Q 30 DAYS    . Dupilumab (DUPIXENT) 300 MG/2ML SOSY     . furosemide (LASIX) 40 MG tablet Take 1 tablet (40 mg total) by mouth daily. 90 tablet 0  . gabapentin (NEURONTIN) 100 MG capsule Take 200 mg by mouth 2 (two) times daily.    [name] HYDROcodone-acetaminophen (NORCO) 7.5-325 MG tablet Take 1 tablet by mouth every 6 (six) hours as needed for moderate pain. 30 tablet 0  . levothyroxine (SYNTHROID) 137 MCG tablet TK 1 T PO QD 30 TO 60 MIN B BRE OES AND WITH A GLASS OF WATER    . linaclotide (LINZESS) 145 MCG CAPS capsule Take 1 capsule (145 mcg total) by mouth daily before breakfast for 30 days. 30 capsule 1  . metolazone (ZAROXOLYN) 5 MG tablet Take 5 mg by mouth daily as needed.     . montelukast (SINGULAIR) 10 MG tablet Take 10 mg by mouth at bedtime.    . pantoprazole (PROTONIX) 40 MG tablet Take 1 tablet (40 mg total) by mouth daily for 30 days. 30 tablet 0  . polyethylene glycol powder (GLYCOLAX/MIRALAX) 17 GM/SCOOP  powder Take 17 g by mouth daily as needed for moderate constipation. 255 g 0  . potassium chloride SA (K-DUR,KLOR-CON) 20 MEQ tablet Take two tablets (40 meq) by mouth daily in the morning and 1 tablet (20 meq) by mouth daily in the evening 90 tablet 0  . sertraline (ZOLOFT) 50 MG tablet Take 1.5 tablets (75 mg total) by mouth daily. 135 tablet 0  . triamcinolone (KENALOG) 0.025 % cream APP EXT AA BID    . triamterene-hydrochlorothiazide (MAXZIDE-25) 37.5-25 MG tablet TAKE 1 TABLET BY MOUTH EVERY DAY 90 tablet 0   Current Facility-Administered Medications on File Prior to Visit  Medication Dose Route Frequency Provider Last Rate Last Dose  . 0.9 %  sodium chloride infusion   Intravenous Continuous Lequita Asal, MD 0 mL/hr at 05/08/17 1437    . cyanocobalamin ((VITAMIN B-12)) injection 1,000 mcg  1,000 mcg Intramuscular Once Lequita Asal, MD        PAST MEDICAL HISTORY: Past  Medical History:  Diagnosis Date  . Allergic rhinitis due to allergen   . Allergy   . Anemia   . Anxiety   . Arthritis   . B12 deficiency   . Cancer ([place])    skin ca  . Chronic idiopathic urticaria   . Complication of anesthesia    nausea, slow to wake up  . Depression   . Edema   . Family history of adverse reaction to anesthesia    Father - PONV  . Gastric ulcer   . GERD (gastroesophageal reflux disease)   . Gout   . Gout   . Hashimoto's thyroiditis   . Heart murmur   . History of eating disorder   . Hives   . Hyperlipidemia   . Hypertension   . Hypothyroidism   . Lower extremity edema   . Melanoma in situ ([place])    left shoulder  . Migraine    approx 4x/yr  . Mitral valve disorder   . Motion sickness    all moving vehicles  . Neuropathy    fingers and feet  . PCOS (polycystic ovarian syndrome)   . PONV (postoperative nausea and vomiting)   . Thyroid disease   . Ulcer   . Vertigo    last episode over 1 yr ago    PAST SURGICAL HISTORY: Past Surgical History:  Procedure  Laterality Date  . BONE EXCISION Left 09/03/2018   Procedure: PART EXCISION BONE-PHALANX 2,3,4 LEFT;  Surgeon: Albertine Patricia, DPM;  Location: [facility];  Service: Podiatry;  Laterality: Left;  . BREAST BIOPSY Right 2007   neg- bx/clip  . CHOLECYSTECTOMY  2003  . COLONOSCOPY WITH PROPOFOL N/A 05/11/2018   Procedure: COLONOSCOPY WITH PROPOFOL;  Surgeon: [name], MD;  Location: [facility];  Service: Endoscopy;  Laterality: N/A;  . DILITATION & CURRETTAGE/HYSTROSCOPY WITH NOVASURE ABLATION N/A 08/19/2016   Procedure: DILATATION & CURETTAGE/HYSTEROSCOPY WITH NOVASURE ABLATION;  Surgeon: [name], MD;  Location: [facility];  Service: Gynecology;  Laterality: N/A;  . ESOPHAGOGASTRODUODENOSCOPY (EGD) WITH PROPOFOL N/A 12/05/2015   Procedure: ESOPHAGOGASTRODUODENOSCOPY (EGD) WITH PROPOFOL;  Surgeon: Lollie Sails, MD;  Location: [facility] END[facility]COPY;  Service: Endoscopy;  Laterality: N/A;  . ESOPHAGOGASTRODUODENOSCOPY (EGD) WITH PROPOFOL N/A 02/06/2016   Procedure: ESOPHAGOGASTRODUODENOSCOPY (EGD) WITH PROPOFOL;  Surgeon: Lollie Sails, MD;  Location: [facility] END[facility]COPY;  Service: Endoscopy;  Laterality: N/A;  . ESOPHAGOGASTRODUODENOSCOPY (EGD) WITH PROPOFOL N/A 05/11/2018   Procedure: ESOPHAGOGASTRODUODENOSCOPY (EGD) WITH PROPOFOL;  Surgeon: [name], MD;  Location: [facility];  Service: Endoscopy;  Laterality: N/A;  . ESOPHAGOGASTRODUODENOSCOPY (EGD) WITH PROPOFOL N/A 09/08/2018   Procedure: ESOPHAGOGASTRODUODENOSCOPY (EGD) WITH BIOPSY;  Surgeon: [name], MD;  Location: [place];  Service: Endoscopy;  Laterality: N/A;  . FOOT ARTHRODESIS Left 09/03/2018   Procedure: ARTHRODESIS; HALLUX/IP JOINT LEFT;  Surgeon: Albertine Patricia, DPM;  Location: [place];  Service: Podiatry;  Laterality: Left;  LMA LOCAL  . FOOT SURGERY  1994  . GASTRIC BYPASS  2011  . JOINT REPLACEMENT Left 09/27/2014   HIP  . JOINT REPLACEMENT Left 12/01/2014    KNEE  . KNEE ARTHROSCOPY Right   . KNEE ARTHROSCOPY Left 03/23/2015   Procedure: ARTHROSCOPY KNEE, partial synovectomy;  Surgeon: Hessie Knows, MD;  Location: [facility];  Service: Orthopedics;  Laterality: Left;  . LAPAROSCOPIC GASTRIC BANDING  2010  . POLYPECTOMY N/A 09/08/2018   Procedure: POLYPECTOMY;  Surgeon: [name], MD;  Location: [facility];  Service: Endoscopy;  Laterality:  N/A;  Stomach  . ROTATOR CUFF REPAIR Bilateral    right shoulder 07-26-2016  . SHOULDER ARTHROSCOPY Left 06/20/2015   Procedure: ARTHROSCOPY SHOULDER, REPAIR OF MASSIVE ROTATOR CUFF TEAR, TENODESIS, DECOMPRESSION DEBRIDEMENT;  Surgeon: Corky Mull, MD;  Location: [facility];  Service: Orthopedics;  Laterality: Left;  . TOTAL HIP ARTHROPLASTY Left 09/27/2014   Procedure: TOTAL HIP ARTHROPLASTY ANTERIOR APPROACH;  Surgeon: Hessie Knows, MD;  Location: [facility];  Service: Orthopedics;  Laterality: Left;  . TOTAL KNEE ARTHROPLASTY Left 12/01/2014   Procedure: TOTAL KNEE ARTHROPLASTY;  Surgeon: Hessie Knows, MD;  Location: [facility];  Service: Orthopedics;  Laterality: Left;    SOCIAL HISTORY: Social History   Tobacco Use  . Smoking status: Never Smoker  . Smokeless tobacco: Never Used  Substance Use Topics  . Alcohol use: Yes    Alcohol/week: 0.0 - 1.0 standard drinks    Comment: occ.  . Drug use: No    FAMILY HISTORY: Family History  Problem Relation Age of Onset  . Arthritis Mother   . Hyperlipidemia Mother   . Hypertension Mother   . Diabetes Mother   . Sleep apnea Mother   . Obesity Mother   . Arthritis Father   . Hyperlipidemia Father   . Hypertension Father   . Mental illness Father   . Diabetes Father   . Obesity Father   . Sleep apnea Father   . Bipolar disorder Father   . Anxiety disorder Father   . Depression Father   . Cancer Father   . Kidney disease Father   . Arthritis Maternal Grandmother   . Cancer Maternal Grandmother        breast cancer  . Hyperlipidemia  Maternal Grandmother   . Hypertension Maternal Grandmother   . Breast cancer Maternal Grandmother   . Arthritis Maternal Grandfather   . Hyperlipidemia Maternal Grandfather   . Hypertension Maternal Grandfather   . Heart disease Maternal Grandfather        heart attack  . Breast cancer Paternal Grandmother     ROS: Review of Systems  Constitutional: Negative for weight loss.  Cardiovascular: Negative for chest pain and claudication.  Gastrointestinal: Negative for nausea.  Musculoskeletal: Negative for myalgias.  Endo/Heme/Allergies:       Negative hypoglycemia    PHYSICAL EXAM: Pt in no acute distress  RECENT LABS AND TESTS: BMET    Component Value Date/Time   NA 139 09/01/2018 1527   K 3.7 09/01/2018 1527   K 4.0 09/27/2014   CL 92 (L) 09/01/2018 1527   CO2 30 (H) 09/01/2018 1527   GLUCOSE 101 (H) 09/01/2018 1527   GLUCOSE 102 (H) 08/25/2018 1716   BUN 16 09/01/2018 1527   CREATININE 0.98 09/01/2018 1527   CREATININE 0.81 09/12/2017 1529   CALCIUM 8.8 09/01/2018 1527   GFRNONAA 67 09/01/2018 1527   GFRAA 77 09/01/2018 1527   Lab Results  Component Value Date   HGBA1C 5.8 05/26/2018   HGBA1C 5.5 04/10/2017   HGBA1C 5.4 04/07/2015   Lab Results  Component Value Date   INSULIN 12.2 06/17/2018   CBC    Component Value Date/Time   WBC 10.8 (H) 08/25/2018 1716   RBC 4.45 08/25/2018 1716   HGB 13.6 08/25/2018 1716   HGB 14.2 07/09/2018 1459   HCT 41.5 08/25/2018 1716   HCT 40.4 07/09/2018 1459   PLT 357 08/25/2018 1716   PLT 316 07/09/2018 1459   MCV 93.3 08/25/2018 1716   MCV 88 07/09/2018 1459  MCH 30.6 08/25/2018 1716   MCHC 32.8 08/25/2018 1716   RDW 13.2 08/25/2018 1716   RDW 13.0 07/09/2018 1459   LYMPHSABS 2.6 07/09/2018 1459   MONOABS 0.2 05/22/2018 1540   EOSABS 0.1 07/09/2018 1459   BASOSABS 0.1 07/09/2018 1459   Iron/TIBC/Ferritin/ %Sat    Component Value Date/Time   IRON 31 05/22/2018 1540   TIBC 373 05/22/2018 1540   FERRITIN 38  08/25/2018 1425   FERRITIN 42 04/07/2015 0938   IRONPCTSAT 8 (L) 05/22/2018 1540   Lipid Panel     Component Value Date/Time   CHOL 178 05/26/2018 0842   CHOL 202 (H) 04/10/2017 0821   TRIG 256.0 (H) 05/26/2018 0842   HDL 62.60 05/26/2018 0842   HDL 67 04/10/2017 0821   CHOLHDL 3 05/26/2018 0842   VLDL 51.2 (H) 05/26/2018 0842   LDLCALC 82 04/10/2017 0821   LDLDIRECT 106.0 05/26/2018 0842   Hepatic Function Panel     Component Value Date/Time   PROT 7.2 08/25/2018 1716   PROT 6.7 04/10/2017 0821   ALBUMIN 4.3 08/25/2018 1716   ALBUMIN 4.2 04/10/2017 0821   AST 11 (L) 08/25/2018 1716   ALT 16 08/25/2018 1716   ALKPHOS 54 08/25/2018 1716   BILITOT 0.5 08/25/2018 1716   BILITOT 0.3 04/10/2017 0821   BILIDIR 0.1 09/12/2017 1529   BILIDIR 0.09 04/10/2017 0821   IBILI 0.2 09/12/2017 1529      Component Value Date/Time   TSH 0.57 05/26/2018 0842   TSH 0.54 12/26/2016 0805   TSH 1.40 02/28/2015 1406      I, Trixie Dredge, am acting as transcriptionist for Ilene Qua, MD  I have reviewed the above documentation for accuracy and completeness, and I agree with the above. - Ilene Qua, MD

## 2018-09-15 ENCOUNTER — Encounter: Payer: Self-pay | Admitting: Gastroenterology

## 2018-09-16 ENCOUNTER — Telehealth: Payer: Self-pay

## 2018-09-16 NOTE — Telephone Encounter (Signed)
Copied from [place] [id number]. Topic: General - Other >> Sep 15, 2018  3:07 PM Keene Breath wrote: Reason for CRM: Patient is returning a call to [place].  Please call patient at [phone]

## 2018-09-21 NOTE — Telephone Encounter (Signed)
See result note.  

## 2018-09-24 ENCOUNTER — Telehealth (INDEPENDENT_AMBULATORY_CARE_PROVIDER_SITE_OTHER): Payer: Managed Care, Other (non HMO) | Admitting: Family Medicine

## 2018-09-24 ENCOUNTER — Other Ambulatory Visit: Payer: Self-pay

## 2018-09-24 DIAGNOSIS — R7303 Prediabetes: Secondary | ICD-10-CM

## 2018-09-24 DIAGNOSIS — E669 Obesity, unspecified: Secondary | ICD-10-CM | POA: Diagnosis not present

## 2018-09-24 DIAGNOSIS — E559 Vitamin D deficiency, unspecified: Secondary | ICD-10-CM

## 2018-09-24 DIAGNOSIS — Z6834 Body mass index (BMI) 34.0-34.9, adult: Secondary | ICD-10-CM

## 2018-09-24 MED ORDER — METFORMIN HCL 500 MG PO TABS: 500.0000 mg | ORAL_TABLET | Freq: Every day | ORAL | 0 refills | Status: DC

## 2018-09-28 NOTE — Progress Notes (Signed)
Office: [phone]  /  Fax: [phone] TeleHealth Visit:  Lori Le has verbally consented to this TeleHealth visit today. The patient is located at home, the provider is located at the [facility] office. The participants in this visit include the listed provider and patient. The visit was conducted today via webex.  HPI:   Chief Complaint: OBESITY Lori Le is here to discuss her progress with her obesity treatment plan. She is on the keep a food journal with 1150-1300 calories and 80+ grams of protein daily or follow the Category 2 plan and is following her eating plan approximately 0 % of the time. She states she is exercising 0 minutes 0 times per week. Lori Le voices the last few weeks have been "so so". She is still recovering from surgery and still non weight bearing. Mental health wise, she is struggling. She reports  She has been snacking more and not keeping track.  We were unable to weigh the patient today for this TeleHealth visit. She is unsure if she has lost or gained weight since her last visit. She has lost 2 lbs since starting treatment with [name].  Vitamin D Deficiency Lori Le has a diagnosis of vitamin D deficiency. She is currently taking prescription Vit D. She notes fatigue and denies nausea, vomiting or muscle weakness.  Pre-Diabetes Lori Le has a diagnosis of pre-diabetes based on her elevated Hgb A1c and was informed this puts her at greater risk of developing diabetes. She notes snacking and carbohydrate cravings, and denies GI side effects of metformin. She continues to work on diet and exercise to decrease risk of diabetes. She denies hypoglycemia.  ASSESSMENT AND PLAN:  Vitamin D deficiency  Prediabetes - Plan: metFORMIN (GLUCOPHAGE) 500 MG tablet  Class 1 obesity with serious comorbidity and body mass index (BMI) of 34.0 to 34.9 in adult, unspecified obesity type  PLAN:  Vitamin D Deficiency Lori Le was informed that low vitamin D levels contributes  to fatigue and are associated with obesity, breast, and colon cancer. Lori Le agrees to continue taking prescription Vit D 50,000 IU every week, no refill needed. She will follow up for routine testing of vitamin D, at least 2-3 times per year. She was informed of the risk of over-replacement of vitamin D and agrees to not increase her dose unless she discusses this with [name] first. Lori Le agrees to follow up with our clinic in 2 weeks.  Pre-Diabetes Lori Le will continue to work on weight loss, exercise, and decreasing simple carbohydrates in her diet to help decrease the risk of diabetes. We dicussed metformin including benefits and risks. She was informed that eating too many simple carbohydrates or too many calories at one sitting increases the likelihood of GI side effects. Lori Le agrees to continue taking metformin 500 mg PO q AM #90 day supply with no refills. We will repeat labs at her first in office appointment. Lori Le agrees to follow up with our clinic in 2 weeks as directed to monitor her progress.  Obesity Lori Le is currently in the action stage of change. As such, her goal is to continue with weight loss efforts She has agreed to keep a food journal with 1150-1300 calories and 80+ grams of protein daily Lori Le has been instructed to work up to a goal of 150 minutes of combined cardio and strengthening exercise per week for weight loss and overall health benefits. We discussed the following Behavioral Modification Strategies today: increasing lean protein intake, increasing vegetables and work on meal planning and  easy cooking plans, keeping healthy foods in the home, better snacking choices, and planning for success   Lori Le has agreed to follow up with our clinic in 2 weeks. She was informed of the importance of frequent follow up visits to maximize her success with intensive lifestyle modifications for her multiple health conditions.  ALLERGIES: Allergies  Allergen Reactions  . Contrast Media  [Iodinated Diagnostic Agents] Anaphylaxis    MRI dye  . Etodolac Anaphylaxis  . Alpha-Gal     Flu- like symptoms  . Influenza Vaccines Hives  . Lactose Nausea And Vomiting    Can have yogurt Can not have milk and ice cream   . Lactose Intolerance (Gi) Nausea And Vomiting    Can have yogurt Can not have milk and ice cream    . Bacitracin-Neomycin-Polymyxin Rash  . Neomycin-Bacitracin Zn-Polymyx Rash  . Neosporin + Pain Relief Max St [Neomy-Bacit-Polymyx-Pramoxine] Rash    MEDICATIONS: Current Outpatient Medications on File Prior to Visit  Medication Sig Dispense Refill  . allopurinol (ZYLOPRIM) 300 MG tablet TAKE 1 TABLET BY MOUTH EVERY DAY 90 tablet 2  . cephALEXin (KEFLEX) 500 MG capsule Take 1 capsule (500 mg total) by mouth 3 (three) times daily. (Patient not taking: Reported on 09/08/2018) 21 capsule 0  . cyanocobalamin (,VITAMIN B-12,) 1000 MCG/ML injection ADM 1 ML IM Q 30 DAYS    . Dupilumab (DUPIXENT) 300 MG/2ML SOSY     . furosemide (LASIX) 40 MG tablet Take 1 tablet (40 mg total) by mouth daily. 90 tablet 0  . gabapentin (NEURONTIN) 100 MG capsule Take 200 mg by mouth 2 (two) times daily.    [name] HYDROcodone-acetaminophen (NORCO) 7.5-325 MG tablet Take 1 tablet by mouth every 6 (six) hours as needed for moderate pain. 30 tablet 0  . levothyroxine (SYNTHROID) 137 MCG tablet TK 1 T PO QD 30 TO 60 MIN B BRE OES AND WITH A GLASS OF WATER    . linaclotide (LINZESS) 145 MCG CAPS capsule Take 1 capsule (145 mcg total) by mouth daily before breakfast for 30 days. 30 capsule 1  . metolazone (ZAROXOLYN) 5 MG tablet Take 5 mg by mouth daily as needed.     . montelukast (SINGULAIR) 10 MG tablet Take 10 mg by mouth at bedtime.    . pantoprazole (PROTONIX) 40 MG tablet Take 1 tablet (40 mg total) by mouth daily for 30 days. 30 tablet 0  . polyethylene glycol powder (GLYCOLAX/MIRALAX) 17 GM/SCOOP powder Take 17 g by mouth daily as needed for moderate constipation. 255 g 0  . potassium  chloride SA (K-DUR,KLOR-CON) 20 MEQ tablet Take two tablets (40 meq) by mouth daily in the morning and 1 tablet (20 meq) by mouth daily in the evening 90 tablet 0  . sertraline (ZOLOFT) 50 MG tablet Take 1.5 tablets (75 mg total) by mouth daily. 135 tablet 0  . triamcinolone (KENALOG) 0.025 % cream APP EXT AA BID    . triamterene-hydrochlorothiazide (MAXZIDE-25) 37.5-25 MG tablet TAKE 1 TABLET BY MOUTH EVERY DAY 90 tablet 0   Current Facility-Administered Medications on File Prior to Visit  Medication Dose Route Frequency Provider Last Rate Last Dose  . 0.9 %  sodium chloride infusion   Intravenous Continuous Lequita Asal, MD 0 mL/hr at 05/08/17 1437    . cyanocobalamin ((VITAMIN B-12)) injection 1,000 mcg  1,000 mcg Intramuscular Once Lequita Asal, MD        PAST MEDICAL HISTORY: Past Medical History:  Diagnosis Date  . Allergic rhinitis  due to allergen   . Allergy   . Anemia   . Anxiety   . Arthritis   . B12 deficiency   . Cancer ([place])    skin ca  . Chronic idiopathic urticaria   . Complication of anesthesia    nausea, slow to wake up  . Depression   . Edema   . Family history of adverse reaction to anesthesia    Father - PONV  . Gastric ulcer   . GERD (gastroesophageal reflux disease)   . Gout   . Gout   . Hashimoto's thyroiditis   . Heart murmur   . History of eating disorder   . Hives   . Hyperlipidemia   . Hypertension   . Hypothyroidism   . Lower extremity edema   . Melanoma in situ ([place])    left shoulder  . Migraine    approx 4x/yr  . Mitral valve disorder   . Motion sickness    all moving vehicles  . Neuropathy    fingers and feet  . PCOS (polycystic ovarian syndrome)   . PONV (postoperative nausea and vomiting)   . Thyroid disease   . Ulcer   . Vertigo    last episode over 1 yr ago    PAST SURGICAL HISTORY: Past Surgical History:  Procedure Laterality Date  . BONE EXCISION Left 09/03/2018   Procedure: PART EXCISION BONE-PHALANX 2,3,4  LEFT;  Surgeon: Albertine Patricia, DPM;  Location: [place];  Service: Podiatry;  Laterality: Left;  . BREAST BIOPSY Right 2007   neg- bx/clip  . CHOLECYSTECTOMY  2003  . COLONOSCOPY WITH PROPOFOL N/A 05/11/2018   Procedure: COLONOSCOPY WITH PROPOFOL;  Surgeon: [name], MD;  Location: [facility];  Service: Endoscopy;  Laterality: N/A;  . DILITATION & CURRETTAGE/HYSTROSCOPY WITH NOVASURE ABLATION N/A 08/19/2016   Procedure: DILATATION & CURETTAGE/HYSTEROSCOPY WITH NOVASURE ABLATION;  Surgeon: [name], MD;  Location: [facility];  Service: Gynecology;  Laterality: N/A;  . ESOPHAGOGASTRODUODENOSCOPY (EGD) WITH PROPOFOL N/A 12/05/2015   Procedure: ESOPHAGOGASTRODUODENOSCOPY (EGD) WITH PROPOFOL;  Surgeon: Lollie Sails, MD;  Location: [facility] ENDOSCOPY;  Service: Endoscopy;  Laterality: N/A;  . ESOPHAGOGASTRODUODENOSCOPY (EGD) WITH PROPOFOL N/A 02/06/2016   Procedure: ESOPHAGOGASTRODUODENOSCOPY (EGD) WITH PROPOFOL;  Surgeon: Lollie Sails, MD;  Location: [facility] ENDOSCOPY;  Service: Endoscopy;  Laterality: N/A;  . ESOPHAGOGASTRODUODENOSCOPY (EGD) WITH PROPOFOL N/A 05/11/2018   Procedure: ESOPHAGOGASTRODUODENOSCOPY (EGD) WITH PROPOFOL;  Surgeon: [name], MD;  Location: [facility] ENDOSCOPY;  Service: Endoscopy;  Laterality: N/A;  . ESOPHAGOGASTRODUODENOSCOPY (EGD) WITH PROPOFOL N/A 09/08/2018   Procedure: ESOPHAGOGASTRODUODENOSCOPY (EGD) WITH BIOPSY;  Surgeon: [name], MD;  Location: [place];  Service: Endoscopy;  Laterality: N/A;  . FOOT ARTHRODESIS Left 09/03/2018   Procedure: ARTHRODESIS; HALLUX/IP JOINT LEFT;  Surgeon: Albertine Patricia, DPM;  Location: [place];  Service: Podiatry;  Laterality: Left;  LMA LOCAL  . FOOT SURGERY  1994  . GASTRIC BYPASS  2011  . JOINT REPLACEMENT Left 09/27/2014   HIP  . JOINT REPLACEMENT Left 12/01/2014   KNEE  . KNEE ARTHROSCOPY Right   . KNEE ARTHROSCOPY Left 03/23/2015   Procedure: ARTHROSCOPY  KNEE, partial synovectomy;  Surgeon: Hessie Knows, MD;  Location: [facility];  Service: Orthopedics;  Laterality: Left;  . LAPAROSCOPIC GASTRIC BANDING  2010  . POLYPECTOMY N/A 09/08/2018   Procedure: POLYPECTOMY;  Surgeon: [name], MD;  Location: [place];  Service: Endoscopy;  Laterality: N/A;  Stomach  . ROTATOR CUFF REPAIR Bilateral  right shoulder 07-26-2016  . SHOULDER ARTHROSCOPY Left 06/20/2015   Procedure: ARTHROSCOPY SHOULDER, REPAIR OF MASSIVE ROTATOR CUFF TEAR, TENODESIS, DECOMPRESSION DEBRIDEMENT;  Surgeon: Corky Mull, MD;  Location: [facility];  Service: Orthopedics;  Laterality: Left;  . TOTAL HIP ARTHROPLASTY Left 09/27/2014   Procedure: TOTAL HIP ARTHROPLASTY ANTERIOR APPROACH;  Surgeon: Hessie Knows, MD;  Location: [facility];  Service: Orthopedics;  Laterality: Left;  . TOTAL KNEE ARTHROPLASTY Left 12/01/2014   Procedure: TOTAL KNEE ARTHROPLASTY;  Surgeon: Hessie Knows, MD;  Location: [facility];  Service: Orthopedics;  Laterality: Left;    SOCIAL HISTORY: Social History   Tobacco Use  . Smoking status: Never Smoker  . Smokeless tobacco: Never Used  Substance Use Topics  . Alcohol use: Yes    Alcohol/week: 0.0 - 1.0 standard drinks    Comment: occ.  . Drug use: No    FAMILY HISTORY: Family History  Problem Relation Age of Onset  . Arthritis Mother   . Hyperlipidemia Mother   . Hypertension Mother   . Diabetes Mother   . Sleep apnea Mother   . Obesity Mother   . Arthritis Father   . Hyperlipidemia Father   . Hypertension Father   . Mental illness Father   . Diabetes Father   . Obesity Father   . Sleep apnea Father   . Bipolar disorder Father   . Anxiety disorder Father   . Depression Father   . Cancer Father   . Kidney disease Father   . Arthritis Maternal Grandmother   . Cancer Maternal Grandmother        breast cancer  . Hyperlipidemia Maternal Grandmother   . Hypertension Maternal Grandmother   . Breast cancer Maternal Grandmother    . Arthritis Maternal Grandfather   . Hyperlipidemia Maternal Grandfather   . Hypertension Maternal Grandfather   . Heart disease Maternal Grandfather        heart attack  . Breast cancer Paternal Grandmother     ROS: Review of Systems  Constitutional: Positive for malaise/fatigue. Negative for weight loss.  Gastrointestinal: Negative for nausea and vomiting.  Musculoskeletal:       Negative muscle weakness  Endo/Heme/Allergies:       Negative hypoglycemia    PHYSICAL EXAM: Pt in no acute distress  RECENT LABS AND TESTS: BMET    Component Value Date/Time   NA 139 09/01/2018 1527   K 3.7 09/01/2018 1527   K 4.0 09/27/2014   CL 92 (L) 09/01/2018 1527   CO2 30 (H) 09/01/2018 1527   GLUCOSE 101 (H) 09/01/2018 1527   GLUCOSE 102 (H) 08/25/2018 1716   BUN 16 09/01/2018 1527   CREATININE 0.98 09/01/2018 1527   CREATININE 0.81 09/12/2017 1529   CALCIUM 8.8 09/01/2018 1527   GFRNONAA 67 09/01/2018 1527   GFRAA 77 09/01/2018 1527   Lab Results  Component Value Date   HGBA1C 5.8 05/26/2018   HGBA1C 5.5 04/10/2017   HGBA1C 5.4 04/07/2015   Lab Results  Component Value Date   INSULIN 12.2 06/17/2018   CBC    Component Value Date/Time   WBC 10.8 (H) 08/25/2018 1716   RBC 4.45 08/25/2018 1716   HGB 13.6 08/25/2018 1716   HGB 14.2 07/09/2018 1459   HCT 41.5 08/25/2018 1716   HCT 40.4 07/09/2018 1459   PLT 357 08/25/2018 1716   PLT 316 07/09/2018 1459   MCV 93.3 08/25/2018 1716   MCV 88 07/09/2018 1459   MCH 30.6 08/25/2018 1716   MCHC 32.8  08/25/2018 1716   RDW 13.2 08/25/2018 1716   RDW 13.0 07/09/2018 1459   LYMPHSABS 2.6 07/09/2018 1459   MONOABS 0.2 05/22/2018 1540   EOSABS 0.1 07/09/2018 1459   BASOSABS 0.1 07/09/2018 1459   Iron/TIBC/Ferritin/ %Sat    Component Value Date/Time   IRON 31 05/22/2018 1540   TIBC 373 05/22/2018 1540   FERRITIN 38 08/25/2018 1425   FERRITIN 42 04/07/2015 0938   IRONPCTSAT 8 (L) 05/22/2018 1540   Lipid Panel      Component Value Date/Time   CHOL 178 05/26/2018 0842   CHOL 202 (H) 04/10/2017 0821   TRIG 256.0 (H) 05/26/2018 0842   HDL 62.60 05/26/2018 0842   HDL 67 04/10/2017 0821   CHOLHDL 3 05/26/2018 0842   VLDL 51.2 (H) 05/26/2018 0842   LDLCALC 82 04/10/2017 0821   LDLDIRECT 106.0 05/26/2018 0842   Hepatic Function Panel     Component Value Date/Time   PROT 7.2 08/25/2018 1716   PROT 6.7 04/10/2017 0821   ALBUMIN 4.3 08/25/2018 1716   ALBUMIN 4.2 04/10/2017 0821   AST 11 (L) 08/25/2018 1716   ALT 16 08/25/2018 1716   ALKPHOS 54 08/25/2018 1716   BILITOT 0.5 08/25/2018 1716   BILITOT 0.3 04/10/2017 0821   BILIDIR 0.1 09/12/2017 1529   BILIDIR 0.09 04/10/2017 0821   IBILI 0.2 09/12/2017 1529      Component Value Date/Time   TSH 0.57 05/26/2018 0842   TSH 0.54 12/26/2016 0805   TSH 1.40 02/28/2015 1406      I, Trixie Dredge, am acting as transcriptionist for Ilene Qua, MD  I have reviewed the above documentation for accuracy and completeness, and I agree with the above. - Ilene Qua, MD

## 2018-10-06 ENCOUNTER — Ambulatory Visit: Payer: Managed Care, Other (non HMO) | Admitting: Internal Medicine

## 2018-10-12 ENCOUNTER — Other Ambulatory Visit (INDEPENDENT_AMBULATORY_CARE_PROVIDER_SITE_OTHER): Payer: Self-pay | Admitting: Family Medicine

## 2018-10-12 ENCOUNTER — Other Ambulatory Visit: Payer: Self-pay

## 2018-10-12 ENCOUNTER — Ambulatory Visit (INDEPENDENT_AMBULATORY_CARE_PROVIDER_SITE_OTHER): Payer: Managed Care, Other (non HMO) | Admitting: Family Medicine

## 2018-10-12 ENCOUNTER — Encounter (INDEPENDENT_AMBULATORY_CARE_PROVIDER_SITE_OTHER): Payer: Self-pay | Admitting: Family Medicine

## 2018-10-12 DIAGNOSIS — Z6834 Body mass index (BMI) 34.0-34.9, adult: Secondary | ICD-10-CM | POA: Diagnosis not present

## 2018-10-12 DIAGNOSIS — R7303 Prediabetes: Secondary | ICD-10-CM | POA: Diagnosis not present

## 2018-10-12 DIAGNOSIS — K529 Noninfective gastroenteritis and colitis, unspecified: Secondary | ICD-10-CM

## 2018-10-12 DIAGNOSIS — E669 Obesity, unspecified: Secondary | ICD-10-CM

## 2018-10-12 NOTE — Progress Notes (Signed)
Office: [phone]  /  Fax: [phone] TeleHealth Visit:  Lori Le has verbally consented to this TeleHealth visit today. The patient is located at home, the provider is located at the [facility] office. The participants in this visit include the listed provider and patient. The visit was conducted today via webex.  HPI:   Chief Complaint: OBESITY Lori Le is here to discuss her progress with her obesity treatment plan. She is on the keep a food journal with 1150-1300 calories and 80+ grams of protein daily and is following her eating plan approximately 50 % of the time. She states she is exercising 0 minutes 0 times per week. Lori Le is still recovering from foot surgery. She has been unable to get up and move much, secondary to foot recovery. She has not been super compliant with journaling. She has been eating snacks that her friends have brought over when she was house sitting.  We were unable to weigh the patient today for this TeleHealth visit. She is unsure if she has gained or lost weight since her last visit. She has lost 2 lbs since starting treatment with [name].  Gastroenteritis Lori Le has a diagnosis of gastroenteritis. She is alternating between diarrhea and constipation. She notes significant flatulence.  Pre-Diabetes Lori Le has a diagnosis of pre-diabetes based on her elevated Hgb A1c and was informed this puts her at greater risk of developing diabetes. She is still experiencing cravings and denies symptoms associated with metformin. She continues to work on diet and exercise to decrease risk of diabetes. She denies nausea or hypoglycemia.  ASSESSMENT AND PLAN:  Gastroenteritis - Plan: Gliadin antibodies, serum  PLAN:  Gastroenteritis Gliadin ABS lab was ordered, and Barbra agrees to follow up with our clinic in 2 weeks.  Pre-Diabetes Lori Le will continue to work on weight loss, exercise, and decreasing simple carbohydrates in her diet to help decrease the risk of  diabetes. We dicussed metformin including benefits and risks. She was informed that eating too many simple carbohydrates or too many calories at one sitting increases the likelihood of GI side effects. Lori Le agrees to continue taking metformin and we will repeat labs at her next appointment. Lori Le agrees to follow up with our clinic in 2 weeks as directed to monitor her progress.  Obesity Lori Le is currently in the action stage of change. As such, her goal is to continue with weight loss efforts She has agreed to keep a food journal with 1150-1300 calories and 80+ grams of protein daily Lori Le has been instructed to work up to a goal of 150 minutes of combined cardio and strengthening exercise per week for weight loss and overall health benefits. We discussed the following Behavioral Modification Strategies today: increasing lean protein intake and work on meal planning and easy cooking plans, better snacking choices, planning for success, and keep a strict food journal   Lori Le has agreed to follow up with our clinic in 2 weeks. She was informed of the importance of frequent follow up visits to maximize her success with intensive lifestyle modifications for her multiple health conditions.  ALLERGIES: Allergies  Allergen Reactions  . Contrast Media [Iodinated Diagnostic Agents] Anaphylaxis    MRI dye  . Etodolac Anaphylaxis  . Alpha-Gal     Flu- like symptoms  . Influenza Vaccines Hives  . Lactose Nausea And Vomiting    Can have yogurt Can not have milk and ice cream   . Lactose Intolerance (Gi) Nausea And Vomiting    Can  have yogurt Can not have milk and ice cream    . Bacitracin-Neomycin-Polymyxin Rash  . Neomycin-Bacitracin Zn-Polymyx Rash  . Neosporin + Pain Relief Max St [Neomy-Bacit-Polymyx-Pramoxine] Rash    MEDICATIONS: Current Outpatient Medications on File Prior to Visit  Medication Sig Dispense Refill  . allopurinol (ZYLOPRIM) 300 MG tablet TAKE 1 TABLET BY MOUTH EVERY DAY 90  tablet 2  . cephALEXin (KEFLEX) 500 MG capsule Take 1 capsule (500 mg total) by mouth 3 (three) times daily. 21 capsule 0  . cyanocobalamin (,VITAMIN B-12,) 1000 MCG/ML injection ADM 1 ML IM Q 30 DAYS    . Dupilumab (DUPIXENT) 300 MG/2ML SOSY     . furosemide (LASIX) 40 MG tablet Take 1 tablet (40 mg total) by mouth daily. 90 tablet 0  . gabapentin (NEURONTIN) 100 MG capsule Take 200 mg by mouth 2 (two) times daily.    [name] levothyroxine (SYNTHROID) 137 MCG tablet TK 1 T PO QD 30 TO 60 MIN B BRE OES AND WITH A GLASS OF WATER    . metFORMIN (GLUCOPHAGE) 500 MG tablet Take 1 tablet (500 mg total) by mouth daily with breakfast. 90 tablet 0  . montelukast (SINGULAIR) 10 MG tablet Take 10 mg by mouth at bedtime.    . polyethylene glycol powder (GLYCOLAX/MIRALAX) 17 GM/SCOOP powder Take 17 g by mouth daily as needed for moderate constipation. 255 g 0  . potassium chloride SA (K-DUR,KLOR-CON) 20 MEQ tablet Take two tablets (40 meq) by mouth daily in the morning and 1 tablet (20 meq) by mouth daily in the evening 90 tablet 0  . sertraline (ZOLOFT) 50 MG tablet Take 1.5 tablets (75 mg total) by mouth daily. 135 tablet 0  . triamcinolone (KENALOG) 0.025 % cream APP EXT AA BID    . triamterene-hydrochlorothiazide (MAXZIDE-25) 37.5-25 MG tablet TAKE 1 TABLET BY MOUTH EVERY DAY 90 tablet 0  . HYDROcodone-acetaminophen (NORCO) 7.5-325 MG tablet Take 1 tablet by mouth every 6 (six) hours as needed for moderate pain. (Patient not taking: Reported on 10/12/2018) 30 tablet 0  . linaclotide (LINZESS) 145 MCG CAPS capsule Take 1 capsule (145 mcg total) by mouth daily before breakfast for 30 days. 30 capsule 1  . metolazone (ZAROXOLYN) 5 MG tablet Take 5 mg by mouth daily as needed.     . pantoprazole (PROTONIX) 40 MG tablet Take 1 tablet (40 mg total) by mouth daily for 30 days. 30 tablet 0   Current Facility-Administered Medications on File Prior to Visit  Medication Dose Route Frequency Provider Last Rate Last Dose   . 0.9 %  sodium chloride infusion   Intravenous Continuous Lequita Asal, MD 0 mL/hr at 05/08/17 1437    . cyanocobalamin ((VITAMIN B-12)) injection 1,000 mcg  1,000 mcg Intramuscular Once Lequita Asal, MD        PAST MEDICAL HISTORY: Past Medical History:  Diagnosis Date  . Allergic rhinitis due to allergen   . Allergy   . Anemia   . Anxiety   . Arthritis   . B12 deficiency   . Cancer ([place])    skin ca  . Chronic idiopathic urticaria   . Complication of anesthesia    nausea, slow to wake up  . Depression   . Edema   . Family history of adverse reaction to anesthesia    Father - PONV  . Gastric ulcer   . GERD (gastroesophageal reflux disease)   . Gout   . Gout   . Hashimoto's thyroiditis   .  Heart murmur   . History of eating disorder   . Hives   . Hyperlipidemia   . Hypertension   . Hypothyroidism   . Lower extremity edema   . Melanoma in situ ([place])    left shoulder  . Migraine    approx 4x/yr  . Mitral valve disorder   . Motion sickness    all moving vehicles  . Neuropathy    fingers and feet  . PCOS (polycystic ovarian syndrome)   . PONV (postoperative nausea and vomiting)   . Thyroid disease   . Ulcer   . Vertigo    last episode over 1 yr ago    PAST SURGICAL HISTORY: Past Surgical History:  Procedure Laterality Date  . BONE EXCISION Left 09/03/2018   Procedure: PART EXCISION BONE-PHALANX 2,3,4 LEFT;  Surgeon: Albertine Patricia, DPM;  Location: [facility];  Service: Podiatry;  Laterality: Left;  . BREAST BIOPSY Right 2007   neg- bx/clip  . CHOLECYSTECTOMY  2003  . COLONOSCOPY WITH PROPOFOL N/A 05/11/2018   Procedure: COLONOSCOPY WITH PROPOFOL;  Surgeon: [name], MD;  Location: [facility] ENDOSCOPY;  Service: Endoscopy;  Laterality: N/A;  . DILITATION & CURRETTAGE/HYSTROSCOPY WITH NOVASURE ABLATION N/A 08/19/2016   Procedure: DILATATION & CURETTAGE/HYSTEROSCOPY WITH NOVASURE ABLATION;  Surgeon: [name], MD;  Location: [facility]  ORS;  Service: Gynecology;  Laterality: N/A;  . ESOPHAGOGASTRODUODENOSCOPY (EGD) WITH PROPOFOL N/A 12/05/2015   Procedure: ESOPHAGOGASTRODUODENOSCOPY (EGD) WITH PROPOFOL;  Surgeon: Lollie Sails, MD;  Location: [facility] END[facility]COPY;  Service: Endoscopy;  Laterality: N/A;  . ESOPHAGOGASTRODUODENOSCOPY (EGD) WITH PROPOFOL N/A 02/06/2016   Procedure: ESOPHAGOGASTRODUODENOSCOPY (EGD) WITH PROPOFOL;  Surgeon: Lollie Sails, MD;  Location: [facility] ENDOSCOPY;  Service: Endoscopy;  Laterality: N/A;  . ESOPHAGOGASTRODUODENOSCOPY (EGD) WITH PROPOFOL N/A 05/11/2018   Procedure: ESOPHAGOGASTRODUODENOSCOPY (EGD) WITH PROPOFOL;  Surgeon: [name], MD;  Location: [facility] ENDOSCOPY;  Service: Endoscopy;  Laterality: N/A;  . ESOPHAGOGASTRODUODENOSCOPY (EGD) WITH PROPOFOL N/A 09/08/2018   Procedure: ESOPHAGOGASTRODUODENOSCOPY (EGD) WITH BIOPSY;  Surgeon: [name], MD;  Location: [place];  Service: Endoscopy;  Laterality: N/A;  . FOOT ARTHRODESIS Left 09/03/2018   Procedure: ARTHRODESIS; HALLUX/IP JOINT LEFT;  Surgeon: Albertine Patricia, DPM;  Location: [place];  Service: Podiatry;  Laterality: Left;  LMA LOCAL  . FOOT SURGERY  1994  . GASTRIC BYPASS  2011  . JOINT REPLACEMENT Left 09/27/2014   HIP  . JOINT REPLACEMENT Left 12/01/2014   KNEE  . KNEE ARTHROSCOPY Right   . KNEE ARTHROSCOPY Left 03/23/2015   Procedure: ARTHROSCOPY KNEE, partial synovectomy;  Surgeon: Hessie Knows, MD;  Location: [facility] ORS;  Service: Orthopedics;  Laterality: Left;  . LAPAROSCOPIC GASTRIC BANDING  2010  . POLYPECTOMY N/A 09/08/2018   Procedure: POLYPECTOMY;  Surgeon: [name], MD;  Location: [place];  Service: Endoscopy;  Laterality: N/A;  Stomach  . ROTATOR CUFF REPAIR Bilateral    right shoulder 07-26-2016  . SHOULDER ARTHROSCOPY Left 06/20/2015   Procedure: ARTHROSCOPY SHOULDER, REPAIR OF MASSIVE ROTATOR CUFF TEAR, TENODESIS, DECOMPRESSION DEBRIDEMENT;  Surgeon: Corky Mull, MD;  Location: [facility] ORS;  Service: Orthopedics;  Laterality: Left;  . TOTAL HIP ARTHROPLASTY Left 09/27/2014   Procedure: TOTAL HIP ARTHROPLASTY ANTERIOR APPROACH;  Surgeon: Hessie Knows, MD;  Location: [facility] ORS;  Service: Orthopedics;  Laterality: Left;  . TOTAL KNEE ARTHROPLASTY Left 12/01/2014   Procedure: TOTAL KNEE ARTHROPLASTY;  Surgeon: Hessie Knows, MD;  Location: [facility] ORS;  Service: Orthopedics;  Laterality: Left;  SOCIAL HISTORY: Social History   Tobacco Use  . Smoking status: Never Smoker  . Smokeless tobacco: Never Used  Substance Use Topics  . Alcohol use: Yes    Alcohol/week: 0.0 - 1.0 standard drinks    Comment: occ.  . Drug use: No    FAMILY HISTORY: Family History  Problem Relation Age of Onset  . Arthritis Mother   . Hyperlipidemia Mother   . Hypertension Mother   . Diabetes Mother   . Sleep apnea Mother   . Obesity Mother   . Arthritis Father   . Hyperlipidemia Father   . Hypertension Father   . Mental illness Father   . Diabetes Father   . Obesity Father   . Sleep apnea Father   . Bipolar disorder Father   . Anxiety disorder Father   . Depression Father   . Cancer Father   . Kidney disease Father   . Arthritis Maternal Grandmother   . Cancer Maternal Grandmother        breast cancer  . Hyperlipidemia Maternal Grandmother   . Hypertension Maternal Grandmother   . Breast cancer Maternal Grandmother   . Arthritis Maternal Grandfather   . Hyperlipidemia Maternal Grandfather   . Hypertension Maternal Grandfather   . Heart disease Maternal Grandfather        heart attack  . Breast cancer Paternal Grandmother     ROS: Review of Systems  Constitutional: Negative for weight loss.  Gastrointestinal: Positive for constipation and diarrhea. Negative for nausea.  Endo/Heme/Allergies:       Negative hypoglycemia    PHYSICAL EXAM: Pt in no acute distress  RECENT LABS AND TESTS: BMET    Component Value Date/Time   NA 139 09/01/2018 1527    K 3.7 09/01/2018 1527   K 4.0 09/27/2014   CL 92 (L) 09/01/2018 1527   CO2 30 (H) 09/01/2018 1527   GLUCOSE 101 (H) 09/01/2018 1527   GLUCOSE 102 (H) 08/25/2018 1716   BUN 16 09/01/2018 1527   CREATININE 0.98 09/01/2018 1527   CREATININE 0.81 09/12/2017 1529   CALCIUM 8.8 09/01/2018 1527   GFRNONAA 67 09/01/2018 1527   GFRAA 77 09/01/2018 1527   Lab Results  Component Value Date   HGBA1C 5.8 05/26/2018   HGBA1C 5.5 04/10/2017   HGBA1C 5.4 04/07/2015   Lab Results  Component Value Date   INSULIN 12.2 06/17/2018   CBC    Component Value Date/Time   WBC 10.8 (H) 08/25/2018 1716   RBC 4.45 08/25/2018 1716   HGB 13.6 08/25/2018 1716   HGB 14.2 07/09/2018 1459   HCT 41.5 08/25/2018 1716   HCT 40.4 07/09/2018 1459   PLT 357 08/25/2018 1716   PLT 316 07/09/2018 1459   MCV 93.3 08/25/2018 1716   MCV 88 07/09/2018 1459   MCH 30.6 08/25/2018 1716   MCHC 32.8 08/25/2018 1716   RDW 13.2 08/25/2018 1716   RDW 13.0 07/09/2018 1459   LYMPHSABS 2.6 07/09/2018 1459   MONOABS 0.2 05/22/2018 1540   EOSABS 0.1 07/09/2018 1459   BASOSABS 0.1 07/09/2018 1459   Iron/TIBC/Ferritin/ %Sat    Component Value Date/Time   IRON 31 05/22/2018 1540   TIBC 373 05/22/2018 1540   FERRITIN 38 08/25/2018 1425   FERRITIN 42 04/07/2015 0938   IRONPCTSAT 8 (L) 05/22/2018 1540   Lipid Panel     Component Value Date/Time   CHOL 178 05/26/2018 0842   CHOL 202 (H) 04/10/2017 0821   TRIG 256.0 (H) 05/26/2018 7371  HDL 62.60 05/26/2018 0842   HDL 67 04/10/2017 0821   CHOLHDL 3 05/26/2018 0842   VLDL 51.2 (H) 05/26/2018 0842   LDLCALC 82 04/10/2017 0821   LDLDIRECT 106.0 05/26/2018 0842   Hepatic Function Panel     Component Value Date/Time   PROT 7.2 08/25/2018 1716   PROT 6.7 04/10/2017 0821   ALBUMIN 4.3 08/25/2018 1716   ALBUMIN 4.2 04/10/2017 0821   AST 11 (L) 08/25/2018 1716   ALT 16 08/25/2018 1716   ALKPHOS 54 08/25/2018 1716   BILITOT 0.5 08/25/2018 1716   BILITOT 0.3  04/10/2017 0821   BILIDIR 0.1 09/12/2017 1529   BILIDIR 0.09 04/10/2017 0821   IBILI 0.2 09/12/2017 1529      Component Value Date/Time   TSH 0.57 05/26/2018 0842   TSH 0.54 12/26/2016 0805   TSH 1.40 02/28/2015 1406      I, Trixie Dredge, am acting as transcriptionist for Ilene Qua, MD  I have reviewed the above documentation for accuracy and completeness, and I agree with the above. - Ilene Qua, MD

## 2018-10-13 LAB — GLIADIN DEAMIDATED PEPT AB,IGA: Antigliadin Abs, IgA: 6 units (ref 0–19)

## 2018-10-13 LAB — GLIADIN ANTIBODY, IGG: Gliadin IgG: 3 units (ref 0–19)

## 2018-10-22 ENCOUNTER — Other Ambulatory Visit: Payer: Self-pay

## 2018-10-22 ENCOUNTER — Encounter: Payer: Self-pay | Admitting: Internal Medicine

## 2018-10-22 ENCOUNTER — Ambulatory Visit (INDEPENDENT_AMBULATORY_CARE_PROVIDER_SITE_OTHER): Payer: Managed Care, Other (non HMO) | Admitting: Internal Medicine

## 2018-10-22 DIAGNOSIS — I10 Essential (primary) hypertension: Secondary | ICD-10-CM

## 2018-10-22 DIAGNOSIS — E538 Deficiency of other specified B group vitamins: Secondary | ICD-10-CM | POA: Diagnosis not present

## 2018-10-22 DIAGNOSIS — E876 Hypokalemia: Secondary | ICD-10-CM

## 2018-10-22 DIAGNOSIS — K219 Gastro-esophageal reflux disease without esophagitis: Secondary | ICD-10-CM

## 2018-10-22 DIAGNOSIS — Z760 Encounter for issue of repeat prescription: Secondary | ICD-10-CM

## 2018-10-22 DIAGNOSIS — F320 Major depressive disorder, single episode, mild: Secondary | ICD-10-CM

## 2018-10-22 DIAGNOSIS — D039 Melanoma in situ, unspecified: Secondary | ICD-10-CM

## 2018-10-22 DIAGNOSIS — D500 Iron deficiency anemia secondary to blood loss (chronic): Secondary | ICD-10-CM | POA: Diagnosis not present

## 2018-10-22 DIAGNOSIS — F32A Depression, unspecified: Secondary | ICD-10-CM

## 2018-10-22 DIAGNOSIS — R739 Hyperglycemia, unspecified: Secondary | ICD-10-CM

## 2018-10-22 DIAGNOSIS — R21 Rash and other nonspecific skin eruption: Secondary | ICD-10-CM

## 2018-10-22 DIAGNOSIS — R600 Localized edema: Secondary | ICD-10-CM | POA: Diagnosis not present

## 2018-10-22 DIAGNOSIS — E039 Hypothyroidism, unspecified: Secondary | ICD-10-CM

## 2018-10-22 DIAGNOSIS — E7800 Pure hypercholesterolemia, unspecified: Secondary | ICD-10-CM

## 2018-10-22 DIAGNOSIS — K5900 Constipation, unspecified: Secondary | ICD-10-CM | POA: Diagnosis not present

## 2018-10-22 MED ORDER — PANTOPRAZOLE SODIUM 40 MG PO TBEC: 40.0000 mg | DELAYED_RELEASE_TABLET | Freq: Every day | ORAL | 1 refills | Status: DC

## 2018-10-22 MED ORDER — FUROSEMIDE 40 MG PO TABS: 40.0000 mg | ORAL_TABLET | Freq: Every day | ORAL | 1 refills | Status: DC

## 2018-10-22 MED ORDER — TRIAMTERENE-HCTZ 37.5-25 MG PO TABS: 1.0000 | ORAL_TABLET | Freq: Every day | ORAL | 1 refills | Status: DC

## 2018-10-22 NOTE — Progress Notes (Signed)
Patient ID: Lori Le, female   DOB: [birth year], 53 y.o.   MRN: [id number]   Virtual Visit via video Note  This visit type was conducted due to national recommendations for restrictions regarding the COVID-19 pandemic (e.g. social distancing).  This format is felt to be most appropriate for this patient at this time.  All issues noted in this document were discussed and addressed.  No physical exam was performed (except for noted visual exam findings with Video Visits).   I connected with Lori Le by a video enabled telemedicine application and verified that I am speaking with the correct person using two identifiers. Location patient: home Location provider: work or home office Persons participating in the virtual visit: patient, provider  I discussed the limitations, risks, security and privacy concerns of performing an evaluation and management service by video and the availability of in person appointments. The patient expressed understanding and agreed to proceed.   Reason for visit: scheduled follow up.     HPI: She is being followed by Dr Adair Patter for weight loss.  Just evaluated 10/12/18.  Discussed diet and exercise - low carb diet.  Taking metformin.  Has not been able to exercise given recent foot surgery.  Sugars averaging 110-120s. No chest pain.  Breathing stable.  No sob.  No acid reflux reported.  No abdominal pain.  Does report alternating diarrhea and constipation.  Has seen GI - Dr Ronelle Nigh.  Tried to adjust her lasix/hctz.  She has problems with lower extremity swelling.  Having issues with low potassium.  Saw nephrology.  Discussed again with her today to try and limit the amount of hctz.  On lasix as well.  Persistent rash.  Request referral to dermatology.  States weight today - 233 pounds.     ROS: See pertinent positives and negatives per HPI.  Past Medical History:  Diagnosis Date  . Allergic rhinitis due to allergen   . Allergy   . Anemia   . Anxiety   .  Arthritis   . B12 deficiency   . Cancer ([place])    skin ca  . Chronic idiopathic urticaria   . Complication of anesthesia    nausea, slow to wake up  . Depression   . Edema   . Family history of adverse reaction to anesthesia    Father - PONV  . Gastric ulcer   . GERD (gastroesophageal reflux disease)   . Gout   . Gout   . Hashimoto's thyroiditis   . Heart murmur   . History of eating disorder   . Hives   . Hyperlipidemia   . Hypertension   . Hypothyroidism   . Lower extremity edema   . Melanoma in situ ([place])    left shoulder  . Migraine    approx 4x/yr  . Mitral valve disorder   . Motion sickness    all moving vehicles  . Neuropathy    fingers and feet  . PCOS (polycystic ovarian syndrome)   . PONV (postoperative nausea and vomiting)   . Thyroid disease   . Ulcer   . Vertigo    last episode over 1 yr ago    Past Surgical History:  Procedure Laterality Date  . BONE EXCISION Left 09/03/2018   Procedure: PART EXCISION BONE-PHALANX 2,3,4 LEFT;  Surgeon: Albertine Patricia, DPM;  Location: [place];  Service: Podiatry;  Laterality: Left;  . BREAST BIOPSY Right 2007   neg- bx/clip  . CHOLECYSTECTOMY  2003  . COLONOSCOPY  WITH PROPOFOL N/A 05/11/2018   Procedure: COLONOSCOPY WITH PROPOFOL;  Surgeon: [name], MD;  Location: [facility];  Service: Endoscopy;  Laterality: N/A;  . DILITATION & CURRETTAGE/HYSTROSCOPY WITH NOVASURE ABLATION N/A 08/19/2016   Procedure: DILATATION & CURETTAGE/HYSTEROSCOPY WITH NOVASURE ABLATION;  Surgeon: [name], MD;  Location: [facility];  Service: Gynecology;  Laterality: N/A;  . ESOPHAGOGASTRODUODENOSCOPY (EGD) WITH PROPOFOL N/A 12/05/2015   Procedure: ESOPHAGOGASTRODUODENOSCOPY (EGD) WITH PROPOFOL;  Surgeon: Lollie Sails, MD;  Location: [facility]COPY;  Service: Endoscopy;  Laterality: N/A;  . ESOPHAGOGASTRODUODENOSCOPY (EGD) WITH PROPOFOL N/A 02/06/2016   Procedure: ESOPHAGOGASTRODUODENOSCOPY (EGD) WITH PROPOFOL;   Surgeon: Lollie Sails, MD;  Location: [facility] ENDOSCOPY;  Service: Endoscopy;  Laterality: N/A;  . ESOPHAGOGASTRODUODENOSCOPY (EGD) WITH PROPOFOL N/A 05/11/2018   Procedure: ESOPHAGOGASTRODUODENOSCOPY (EGD) WITH PROPOFOL;  Surgeon: [name], MD;  Location: [facility];  Service: Endoscopy;  Laterality: N/A;  . ESOPHAGOGASTRODUODENOSCOPY (EGD) WITH PROPOFOL N/A 09/08/2018   Procedure: ESOPHAGOGASTRODUODENOSCOPY (EGD) WITH BIOPSY;  Surgeon: [name], MD;  Location: [place];  Service: Endoscopy;  Laterality: N/A;  . FOOT ARTHRODESIS Left 09/03/2018   Procedure: ARTHRODESIS; HALLUX/IP JOINT LEFT;  Surgeon: Albertine Patricia, DPM;  Location: [place];  Service: Podiatry;  Laterality: Left;  LMA LOCAL  . FOOT SURGERY  1994  . GASTRIC BYPASS  2011  . JOINT REPLACEMENT Left 09/27/2014   HIP  . JOINT REPLACEMENT Left 12/01/2014   KNEE  . KNEE ARTHROSCOPY Right   . KNEE ARTHROSCOPY Left 03/23/2015   Procedure: ARTHROSCOPY KNEE, partial synovectomy;  Surgeon: Hessie Knows, MD;  Location: [facility];  Service: Orthopedics;  Laterality: Left;  . LAPAROSCOPIC GASTRIC BANDING  2010  . POLYPECTOMY N/A 09/08/2018   Procedure: POLYPECTOMY;  Surgeon: [name], MD;  Location: [place];  Service: Endoscopy;  Laterality: N/A;  Stomach  . ROTATOR CUFF REPAIR Bilateral    right shoulder 07-26-2016  . SHOULDER ARTHROSCOPY Left 06/20/2015   Procedure: ARTHROSCOPY SHOULDER, REPAIR OF MASSIVE ROTATOR CUFF TEAR, TENODESIS, DECOMPRESSION DEBRIDEMENT;  Surgeon: Corky Mull, MD;  Location: [facility];  Service: Orthopedics;  Laterality: Left;  . TOTAL HIP ARTHROPLASTY Left 09/27/2014   Procedure: TOTAL HIP ARTHROPLASTY ANTERIOR APPROACH;  Surgeon: Hessie Knows, MD;  Location: [facility];  Service: Orthopedics;  Laterality: Left;  . TOTAL KNEE ARTHROPLASTY Left 12/01/2014   Procedure: TOTAL KNEE ARTHROPLASTY;  Surgeon: Hessie Knows, MD;  Location: [facility];  Service:  Orthopedics;  Laterality: Left;    Family History  Problem Relation Age of Onset  . Arthritis Mother   . Hyperlipidemia Mother   . Hypertension Mother   . Diabetes Mother   . Sleep apnea Mother   . Obesity Mother   . Arthritis Father   . Hyperlipidemia Father   . Hypertension Father   . Mental illness Father   . Diabetes Father   . Obesity Father   . Sleep apnea Father   . Bipolar disorder Father   . Anxiety disorder Father   . Depression Father   . Cancer Father   . Kidney disease Father   . Arthritis Maternal Grandmother   . Cancer Maternal Grandmother        breast cancer  . Hyperlipidemia Maternal Grandmother   . Hypertension Maternal Grandmother   . Breast cancer Maternal Grandmother   . Arthritis Maternal Grandfather   . Hyperlipidemia Maternal Grandfather   . Hypertension Maternal Grandfather   . Heart disease Maternal Grandfather  heart attack  . Breast cancer Paternal Grandmother     SOCIAL HX: reviewed.    Current Outpatient Medications:  .  allopurinol (ZYLOPRIM) 300 MG tablet, TAKE 1 TABLET BY MOUTH EVERY DAY, Disp: 90 tablet, Rfl: 2 .  cyanocobalamin (,VITAMIN B-12,) 1000 MCG/ML injection, ADM 1 ML IM Q 30 DAYS, Disp: , Rfl:  .  Dupilumab (DUPIXENT) 300 MG/2ML SOSY, , Disp: , Rfl:  .  furosemide (LASIX) 40 MG tablet, Take 1 tablet (40 mg total) by mouth daily., Disp: 90 tablet, Rfl: 1 .  gabapentin (NEURONTIN) 100 MG capsule, Take 200 mg by mouth 2 (two) times daily., Disp: , Rfl:  .  levothyroxine (SYNTHROID) 137 MCG tablet, TK 1 T PO QD 30 TO 60 MIN B BRE OES AND WITH A GLASS OF WATER, Disp: , Rfl:  .  linaclotide (LINZESS) 145 MCG CAPS capsule, Take 1 capsule (145 mcg total) by mouth daily before breakfast for 30 days., Disp: 30 capsule, Rfl: 1 .  metFORMIN (GLUCOPHAGE) 500 MG tablet, Take 1 tablet (500 mg total) by mouth daily with breakfast., Disp: 90 tablet, Rfl: 0 .  metolazone (ZAROXOLYN) 5 MG tablet, Take 5 mg by mouth daily as needed. ,  Disp: , Rfl:  .  montelukast (SINGULAIR) 10 MG tablet, Take 10 mg by mouth at bedtime., Disp: , Rfl:  .  pantoprazole (PROTONIX) 40 MG tablet, Take 1 tablet (40 mg total) by mouth daily., Disp: 90 tablet, Rfl: 1 .  polyethylene glycol powder (GLYCOLAX/MIRALAX) 17 GM/SCOOP powder, Take 17 g by mouth daily as needed for moderate constipation., Disp: 255 g, Rfl: 0 .  potassium chloride SA (K-DUR,KLOR-CON) 20 MEQ tablet, Take two tablets (40 meq) by mouth daily in the morning and 1 tablet (20 meq) by mouth daily in the evening, Disp: 90 tablet, Rfl: 0 .  sertraline (ZOLOFT) 50 MG tablet, Take 1.5 tablets (75 mg total) by mouth daily., Disp: 135 tablet, Rfl: 0 .  triamcinolone (KENALOG) 0.025 % cream, APP EXT AA BID, Disp: , Rfl:  .  triamterene-hydrochlorothiazide (MAXZIDE-25) 37.5-25 MG tablet, Take 1 tablet by mouth daily., Disp: 90 tablet, Rfl: 1 No current facility-administered medications for this visit.   Facility-Administered Medications Ordered in Other Visits:  .  0.9 %  sodium chloride infusion, , Intravenous, Continuous, Corcoran, Melissa C, MD, Last Rate: 0 mL/hr at 05/08/17 1437 .  cyanocobalamin ((VITAMIN B-12)) injection 1,000 mcg, 1,000 mcg, Intramuscular, Once, Corcoran, Drue Second, MD  EXAM:  VITALS per patient if applicable:  003 pounds.   GENERAL: alert, oriented, appears well and in no acute distress  HEENT: atraumatic, conjunttiva clear, no obvious abnormalities on inspection of external nose and ears  NECK: normal movements of the head and neck  LUNGS: on inspection no signs of respiratory distress, breathing rate appears normal, no obvious gross SOB, gasping or wheezing  CV: no obvious cyanosis  PSYCH/NEURO: pleasant and cooperative, no obvious depression or anxiety, speech and thought processing grossly intact  ASSESSMENT AND PLAN:  Discussed the following assessment and plan:  Anemia Follow cbc.    B12 deficiency Continue b12 injections.    Bilateral lower  extremity edema Persistent intermittent issues with swelling.  Not wearing compression hose.  Not using lymphedema pump.  Again discussed with her regarding using these.  On lasix and hctz.  Needs to limit amount of hctz.  Has problems with decreasing dose - increased leg swelling.  Follow potassium.    Constipation Having problems with alternating diarrhea and constipation.  Fiber.  Stay hydrated.  Notify me if persistent problems.    Essential hypertension, benign Blood pressure has been doing well.  Follow pressures.  Follow metabolic panel.    GERD (gastroesophageal reflux disease) Controlled on current regimen.    Hypercholesterolemia Low cholesterol diet and exercise.  Follow lipid panel.    Hyperglycemia Low carb diet and exercise.  Sugars as outlined.  Follow met b and a1c.    Hypokalemia Related to taking lasix and hctz.  On supplements.  Follow potassium level.  Limit hctz.    Hypothyroidism On thyroid replacement.  Follow tsh.   Melanoma in situ Has been followed by dermatology.    Mild depression ([place]) On zoloft.  Stable.    Rash Request referral to dermatology.     I discussed the assessment and treatment plan with the patient. The patient was provided an opportunity to ask questions and all were answered. The patient agreed with the plan and demonstrated an understanding of the instructions.   The patient was advised to call back or seek an in-person evaluation if the symptoms worsen or if the condition fails to improve as anticipated.   Einar Pheasant, MD

## 2018-10-25 ENCOUNTER — Encounter: Payer: Self-pay | Admitting: Internal Medicine

## 2018-10-25 DIAGNOSIS — R21 Rash and other nonspecific skin eruption: Secondary | ICD-10-CM | POA: Insufficient documentation

## 2018-10-25 NOTE — Assessment & Plan Note (Signed)
Low cholesterol diet and exercise.  Follow lipid panel.   

## 2018-10-25 NOTE — Assessment & Plan Note (Signed)
Related to taking lasix and hctz.  On supplements.  Follow potassium level.  Limit hctz.

## 2018-10-25 NOTE — Assessment & Plan Note (Signed)
On thyroid replacement.  Follow tsh.  

## 2018-10-25 NOTE — Assessment & Plan Note (Signed)
Low carb diet and exercise.  Sugars as outlined.  Follow met b and a1c.   

## 2018-10-25 NOTE — Assessment & Plan Note (Signed)
Blood pressure has been doing well.  Follow pressures.  Follow metabolic panel.   

## 2018-10-25 NOTE — Assessment & Plan Note (Signed)
Continue b12 injections.  

## 2018-10-25 NOTE — Assessment & Plan Note (Signed)
Controlled on current regimen.   

## 2018-10-25 NOTE — Assessment & Plan Note (Signed)
Persistent intermittent issues with swelling.  Not wearing compression hose.  Not using lymphedema pump.  Again discussed with her regarding using these.  On lasix and hctz.  Needs to limit amount of hctz.  Has problems with decreasing dose - increased leg swelling.  Follow potassium.

## 2018-10-25 NOTE — Assessment & Plan Note (Signed)
On zoloft.  Stable.  

## 2018-10-25 NOTE — Assessment & Plan Note (Signed)
Having problems with alternating diarrhea and constipation.  Fiber.  Stay hydrated.  Notify me if persistent problems.

## 2018-10-25 NOTE — Assessment & Plan Note (Signed)
Has been followed by dermatology.  

## 2018-10-25 NOTE — Assessment & Plan Note (Signed)
Follow cbc.  

## 2018-10-25 NOTE — Assessment & Plan Note (Signed)
Request referral to dermatology.  

## 2018-10-26 ENCOUNTER — Ambulatory Visit (INDEPENDENT_AMBULATORY_CARE_PROVIDER_SITE_OTHER): Payer: Managed Care, Other (non HMO) | Admitting: Family Medicine

## 2018-10-26 ENCOUNTER — Other Ambulatory Visit: Payer: Self-pay

## 2018-10-26 ENCOUNTER — Encounter (INDEPENDENT_AMBULATORY_CARE_PROVIDER_SITE_OTHER): Payer: Self-pay | Admitting: Family Medicine

## 2018-10-26 VITALS — BP 112/80 | HR 74 | Temp 98.2°F | Ht 67.0 in | Wt 230.0 lb

## 2018-10-26 DIAGNOSIS — Z9189 Other specified personal risk factors, not elsewhere classified: Secondary | ICD-10-CM | POA: Diagnosis not present

## 2018-10-26 DIAGNOSIS — E66812 Obesity, class 2: Secondary | ICD-10-CM

## 2018-10-26 DIAGNOSIS — E559 Vitamin D deficiency, unspecified: Secondary | ICD-10-CM | POA: Diagnosis not present

## 2018-10-26 DIAGNOSIS — R7303 Prediabetes: Secondary | ICD-10-CM | POA: Diagnosis not present

## 2018-10-26 DIAGNOSIS — Z6836 Body mass index (BMI) 36.0-36.9, adult: Secondary | ICD-10-CM

## 2018-10-26 NOTE — Progress Notes (Signed)
Office: [phone]  /  Fax: [phone]   HPI:   Chief Complaint: OBESITY Lori Le is here to discuss her progress with her obesity treatment plan. She is on the keep a food journal with 1150-1300 calories and 80+ grams of protein daily and is following her eating plan approximately 75 % of the time. She states she is exercising 0 minutes 0 times per week. Lori Le is still sitting with her foot propped up most of the time, secondary to being post op from foot surgery. She notes swelling improved. She is off prednisone, but now on Xolair. She has been better with journaling than previously, averaging around 1200-1300 calories with 70 grams of protein.  Her weight is 230 lb (104.3 kg) today and has gained 8 lbs since her last visit. She has lost 0 lbs since starting treatment with [name].  Pre-Diabetes Lori Le has a diagnosis of pre-diabetes based on her elevated Hgb A1c and was informed this puts her at greater risk of developing diabetes. Last Hgb A1c was of 5.8 in February. She is taking metformin currently and continues to work on diet and exercise to decrease risk of diabetes. She denies nausea or hypoglycemia.  At risk for diabetes Lori Le is at higher than average risk for developing diabetes due to her obesity and pre-diabetes. She currently denies polyuria or polydipsia.  Vitamin D Deficiency Lori Le has a diagnosis of vitamin D deficiency. She is currently taking prescription Vit D replacement. Last Vit D level was of 18. She notes fatigue and denies nausea, vomiting or muscle weakness.  ASSESSMENT AND PLAN:  Prediabetes - Plan: Hemoglobin A1c, Insulin, random  Vitamin D deficiency - Plan: VITAMIN D 25 Hydroxy (Vit-D Deficiency, Fractures)  At risk for diabetes mellitus  Class 2 severe obesity with serious comorbidity and body mass index (BMI) of 36.0 to 36.9 in adult, unspecified obesity type [facility])  PLAN:  Pre-Diabetes Lori Le will continue to work on weight loss, exercise, and decreasing  simple carbohydrates in her diet to help decrease the risk of diabetes. We dicussed metformin including benefits and risks. She was informed that eating too many simple carbohydrates or too many calories at one sitting increases the likelihood of GI side effects. Lori Le agrees to continue taking metformin, and we will check Hgb A1c and insulin level today. Lori Le agrees to follow up with our clinic in 2 weeks as directed to monitor her progress.  Diabetes risk counseling Ashyah was given extended (15 minutes) diabetes prevention counseling today. She is 53 y.o. female and has risk factors for diabetes including obesity and pre-diabetes. We discussed intensive lifestyle modifications today with an emphasis on weight loss as well as increasing exercise and decreasing simple carbohydrates in her diet.  Vitamin D Deficiency Lori Le was informed that low vitamin D levels contributes to fatigue and are associated with obesity, breast, and colon cancer. [place] agrees to continue taking prescription Vit D 50,000 IU every week and will follow up for routine testing of vitamin D, at least 2-3 times per year. She was informed of the risk of over-replacement of vitamin D and agrees to not increase her dose unless she discusses this with [name] first. We will check Vit D level today. Gayleen agrees to follow up with our clinic in 2 weeks.  Obesity Lori Le is currently in the action stage of change. As such, her goal is to continue with weight loss efforts She has agreed to keep a food journal with 1150-1300 calories and 80+ grams of protein daily Lori Le  has been instructed to work up to a goal of 150 minutes of combined cardio and strengthening exercise per week for weight loss and overall health benefits. We discussed the following Behavioral Modification Strategies today: increasing lean protein intake, increasing vegetables and work on meal planning and easy cooking plans, keeping healthy foods in the home, better snacking choices, and  planning for success   Lori Le has agreed to follow up with our clinic in 2 weeks. She was informed of the importance of frequent follow up visits to maximize her success with intensive lifestyle modifications for her multiple health conditions.  ALLERGIES: Allergies  Allergen Reactions  . Contrast Media [Iodinated Diagnostic Agents] Anaphylaxis    MRI dye  . Etodolac Anaphylaxis  . Alpha-Gal     Flu- like symptoms  . Influenza Vaccines Hives  . Lactose Nausea And Vomiting    Can have yogurt Can not have milk and ice cream   . Lactose Intolerance (Gi) Nausea And Vomiting    Can have yogurt Can not have milk and ice cream    . Bacitracin-Neomycin-Polymyxin Rash  . Neomycin-Bacitracin Zn-Polymyx Rash  . Neosporin + Pain Relief Max St [Neomy-Bacit-Polymyx-Pramoxine] Rash    MEDICATIONS: Current Outpatient Medications on File Prior to Visit  Medication Sig Dispense Refill  . omalizumab Arvid Right) 150 MG/ML prefilled syringe Inject into the skin every 28 (twenty-eight) days.    [name] allopurinol (ZYLOPRIM) 300 MG tablet TAKE 1 TABLET BY MOUTH EVERY DAY 90 tablet 2  . cyanocobalamin (,VITAMIN B-12,) 1000 MCG/ML injection ADM 1 ML IM Q 30 DAYS    . Dupilumab (DUPIXENT) 300 MG/2ML SOSY     . furosemide (LASIX) 40 MG tablet Take 1 tablet (40 mg total) by mouth daily. 90 tablet 1  . gabapentin (NEURONTIN) 100 MG capsule Take 200 mg by mouth 2 (two) times daily.    [name] levothyroxine (SYNTHROID) 137 MCG tablet TK 1 T PO QD 30 TO 60 MIN B BRE OES AND WITH A GLASS OF WATER    . linaclotide (LINZESS) 145 MCG CAPS capsule Take 1 capsule (145 mcg total) by mouth daily before breakfast for 30 days. 30 capsule 1  . metFORMIN (GLUCOPHAGE) 500 MG tablet Take 1 tablet (500 mg total) by mouth daily with breakfast. 90 tablet 0  . metolazone (ZAROXOLYN) 5 MG tablet Take 5 mg by mouth daily as needed.     . montelukast (SINGULAIR) 10 MG tablet Take 10 mg by mouth at bedtime.    . pantoprazole (PROTONIX) 40 MG  tablet Take 1 tablet (40 mg total) by mouth daily. 90 tablet 1  . polyethylene glycol powder (GLYCOLAX/MIRALAX) 17 GM/SCOOP powder Take 17 g by mouth daily as needed for moderate constipation. 255 g 0  . potassium chloride SA (K-DUR,KLOR-CON) 20 MEQ tablet Take two tablets (40 meq) by mouth daily in the morning and 1 tablet (20 meq) by mouth daily in the evening 90 tablet 0  . sertraline (ZOLOFT) 50 MG tablet Take 1.5 tablets (75 mg total) by mouth daily. 135 tablet 0  . triamcinolone (KENALOG) 0.025 % cream APP EXT AA BID    . triamterene-hydrochlorothiazide (MAXZIDE-25) 37.5-25 MG tablet Take 1 tablet by mouth daily. 90 tablet 1   Current Facility-Administered Medications on File Prior to Visit  Medication Dose Route Frequency Provider Last Rate Last Dose  . 0.9 %  sodium chloride infusion   Intravenous Continuous Lequita Asal, MD 0 mL/hr at 05/08/17 1437    . cyanocobalamin ((VITAMIN B-12)) injection 1,000  mcg  1,000 mcg Intramuscular Once Lequita Asal, MD        PAST MEDICAL HISTORY: Past Medical History:  Diagnosis Date  . Allergic rhinitis due to allergen   . Allergy   . Anemia   . Anxiety   . Arthritis   . B12 deficiency   . Cancer ([place])    skin ca  . Chronic idiopathic urticaria   . Complication of anesthesia    nausea, slow to wake up  . Depression   . Edema   . Family history of adverse reaction to anesthesia    Father - PONV  . Gastric ulcer   . GERD (gastroesophageal reflux disease)   . Gout   . Gout   . Hashimoto's thyroiditis   . Heart murmur   . History of eating disorder   . Hives   . Hyperlipidemia   . Hypertension   . Hypothyroidism   . Lower extremity edema   . Melanoma in situ ([place])    left shoulder  . Migraine    approx 4x/yr  . Mitral valve disorder   . Motion sickness    all moving vehicles  . Neuropathy    fingers and feet  . PCOS (polycystic ovarian syndrome)   . PONV (postoperative nausea and vomiting)   . Thyroid disease    . Ulcer   . Vertigo    last episode over 1 yr ago    PAST SURGICAL HISTORY: Past Surgical History:  Procedure Laterality Date  . BONE EXCISION Left 09/03/2018   Procedure: PART EXCISION BONE-PHALANX 2,3,4 LEFT;  Surgeon: Albertine Patricia, DPM;  Location: [facility];  Service: Podiatry;  Laterality: Left;  . BREAST BIOPSY Right 2007   neg- bx/clip  . CHOLECYSTECTOMY  2003  . COLONOSCOPY WITH PROPOFOL N/A 05/11/2018   Procedure: COLONOSCOPY WITH PROPOFOL;  Surgeon: [name], MD;  Location: [facility] ENDOSCOPY;  Service: Endoscopy;  Laterality: N/A;  . DILITATION & CURRETTAGE/HYSTROSCOPY WITH NOVASURE ABLATION N/A 08/19/2016   Procedure: DILATATION & CURETTAGE/HYSTEROSCOPY WITH NOVASURE ABLATION;  Surgeon: [name], MD;  Location: [facility];  Service: Gynecology;  Laterality: N/A;  . ESOPHAGOGASTRODUODENOSCOPY (EGD) WITH PROPOFOL N/A 12/05/2015   Procedure: ESOPHAGOGASTRODUODENOSCOPY (EGD) WITH PROPOFOL;  Surgeon: Lollie Sails, MD;  Location: [facility] ENDOSCOPY;  Service: Endoscopy;  Laterality: N/A;  . ESOPHAGOGASTRODUODENOSCOPY (EGD) WITH PROPOFOL N/A 02/06/2016   Procedure: ESOPHAGOGASTRODUODENOSCOPY (EGD) WITH PROPOFOL;  Surgeon: Lollie Sails, MD;  Location: [facility] ENDOSCOPY;  Service: Endoscopy;  Laterality: N/A;  . ESOPHAGOGASTRODUODENOSCOPY (EGD) WITH PROPOFOL N/A 05/11/2018   Procedure: ESOPHAGOGASTRODUODENOSCOPY (EGD) WITH PROPOFOL;  Surgeon: [name], MD;  Location: [facility] ENDOSCOPY;  Service: Endoscopy;  Laterality: N/A;  . ESOPHAGOGASTRODUODENOSCOPY (EGD) WITH PROPOFOL N/A 09/08/2018   Procedure: ESOPHAGOGASTRODUODENOSCOPY (EGD) WITH BIOPSY;  Surgeon: [name], MD;  Location: [place];  Service: Endoscopy;  Laterality: N/A;  . FOOT ARTHRODESIS Left 09/03/2018   Procedure: ARTHRODESIS; HALLUX/IP JOINT LEFT;  Surgeon: Albertine Patricia, DPM;  Location: [place];  Service: Podiatry;  Laterality: Left;  LMA LOCAL  . FOOT SURGERY   1994  . GASTRIC BYPASS  2011  . JOINT REPLACEMENT Left 09/27/2014   HIP  . JOINT REPLACEMENT Left 12/01/2014   KNEE  . KNEE ARTHROSCOPY Right   . KNEE ARTHROSCOPY Left 03/23/2015   Procedure: ARTHROSCOPY KNEE, partial synovectomy;  Surgeon: Hessie Knows, MD;  Location: [facility];  Service: Orthopedics;  Laterality: Left;  . LAPAROSCOPIC GASTRIC BANDING  2010  . POLYPECTOMY N/A  09/08/2018   Procedure: POLYPECTOMY;  Surgeon: [name], MD;  Location: [facility];  Service: Endoscopy;  Laterality: N/A;  Stomach  . ROTATOR CUFF REPAIR Bilateral    right shoulder 07-26-2016  . SHOULDER ARTHROSCOPY Left 06/20/2015   Procedure: ARTHROSCOPY SHOULDER, REPAIR OF MASSIVE ROTATOR CUFF TEAR, TENODESIS, DECOMPRESSION DEBRIDEMENT;  Surgeon: Corky Mull, MD;  Location: [facility];  Service: Orthopedics;  Laterality: Left;  . TOTAL HIP ARTHROPLASTY Left 09/27/2014   Procedure: TOTAL HIP ARTHROPLASTY ANTERIOR APPROACH;  Surgeon: Hessie Knows, MD;  Location: [facility];  Service: Orthopedics;  Laterality: Left;  . TOTAL KNEE ARTHROPLASTY Left 12/01/2014   Procedure: TOTAL KNEE ARTHROPLASTY;  Surgeon: Hessie Knows, MD;  Location: [facility];  Service: Orthopedics;  Laterality: Left;    SOCIAL HISTORY: Social History   Tobacco Use  . Smoking status: Never Smoker  . Smokeless tobacco: Never Used  Substance Use Topics  . Alcohol use: Yes    Alcohol/week: 0.0 - 1.0 standard drinks    Comment: occ.  . Drug use: No    FAMILY HISTORY: Family History  Problem Relation Age of Onset  . Arthritis Mother   . Hyperlipidemia Mother   . Hypertension Mother   . Diabetes Mother   . Sleep apnea Mother   . Obesity Mother   . Arthritis Father   . Hyperlipidemia Father   . Hypertension Father   . Mental illness Father   . Diabetes Father   . Obesity Father   . Sleep apnea Father   . Bipolar disorder Father   . Anxiety disorder Father   . Depression Father   . Cancer Father   . Kidney disease  Father   . Arthritis Maternal Grandmother   . Cancer Maternal Grandmother        breast cancer  . Hyperlipidemia Maternal Grandmother   . Hypertension Maternal Grandmother   . Breast cancer Maternal Grandmother   . Arthritis Maternal Grandfather   . Hyperlipidemia Maternal Grandfather   . Hypertension Maternal Grandfather   . Heart disease Maternal Grandfather        heart attack  . Breast cancer Paternal Grandmother     ROS: Review of Systems  Constitutional: Positive for malaise/fatigue. Negative for weight loss.  Gastrointestinal: Negative for nausea and vomiting.  Genitourinary: Negative for frequency.  Musculoskeletal:       Negative muscle weakness  Endo/Heme/Allergies: Negative for polydipsia.       Negative muscle weakness    PHYSICAL EXAM: Blood pressure 112/80, pulse 74, temperature 98.2 F (36.8 C), temperature source Oral, height 5\' 7"  (1.702 m), weight 230 lb (104.3 kg), SpO2 98 %. Body mass index is 36.02 kg/m. Physical Exam Vitals signs reviewed.  Constitutional:      Appearance: Normal appearance. She is obese.  Cardiovascular:     Rate and Rhythm: Normal rate.     Pulses: Normal pulses.  Pulmonary:     Effort: Pulmonary effort is normal.     Breath sounds: Normal breath sounds.  Musculoskeletal: Normal range of motion.  Skin:    General: Skin is warm and dry.  Neurological:     Mental Status: She is alert and oriented to person, place, and time.  Psychiatric:        Mood and Affect: Mood normal.        Behavior: Behavior normal.     RECENT LABS AND TESTS: BMET    Component Value Date/Time   NA 139 09/01/2018 1527   K 3.7  09/01/2018 1527   K 4.0 09/27/2014   CL 92 (L) 09/01/2018 1527   CO2 30 (H) 09/01/2018 1527   GLUCOSE 101 (H) 09/01/2018 1527   GLUCOSE 102 (H) 08/25/2018 1716   BUN 16 09/01/2018 1527   CREATININE 0.98 09/01/2018 1527   CREATININE 0.81 09/12/2017 1529   CALCIUM 8.8 09/01/2018 1527   GFRNONAA 67 09/01/2018 1527    GFRAA 77 09/01/2018 1527   Lab Results  Component Value Date   HGBA1C 5.8 05/26/2018   HGBA1C 5.5 04/10/2017   HGBA1C 5.4 04/07/2015   Lab Results  Component Value Date   INSULIN 12.2 06/17/2018   CBC    Component Value Date/Time   WBC 10.8 (H) 08/25/2018 1716   RBC 4.45 08/25/2018 1716   HGB 13.6 08/25/2018 1716   HGB 14.2 07/09/2018 1459   HCT 41.5 08/25/2018 1716   HCT 40.4 07/09/2018 1459   PLT 357 08/25/2018 1716   PLT 316 07/09/2018 1459   MCV 93.3 08/25/2018 1716   MCV 88 07/09/2018 1459   MCH 30.6 08/25/2018 1716   MCHC 32.8 08/25/2018 1716   RDW 13.2 08/25/2018 1716   RDW 13.0 07/09/2018 1459   LYMPHSABS 2.6 07/09/2018 1459   MONOABS 0.2 05/22/2018 1540   EOSABS 0.1 07/09/2018 1459   BASOSABS 0.1 07/09/2018 1459   Iron/TIBC/Ferritin/ %Sat    Component Value Date/Time   IRON 31 05/22/2018 1540   TIBC 373 05/22/2018 1540   FERRITIN 38 08/25/2018 1425   FERRITIN 42 04/07/2015 0938   IRONPCTSAT 8 (L) 05/22/2018 1540   Lipid Panel     Component Value Date/Time   CHOL 178 05/26/2018 0842   CHOL 202 (H) 04/10/2017 0821   TRIG 256.0 (H) 05/26/2018 0842   HDL 62.60 05/26/2018 0842   HDL 67 04/10/2017 0821   CHOLHDL 3 05/26/2018 0842   VLDL 51.2 (H) 05/26/2018 0842   LDLCALC 82 04/10/2017 0821   LDLDIRECT 106.0 05/26/2018 0842   Hepatic Function Panel     Component Value Date/Time   PROT 7.2 08/25/2018 1716   PROT 6.7 04/10/2017 0821   ALBUMIN 4.3 08/25/2018 1716   ALBUMIN 4.2 04/10/2017 0821   AST 11 (L) 08/25/2018 1716   ALT 16 08/25/2018 1716   ALKPHOS 54 08/25/2018 1716   BILITOT 0.5 08/25/2018 1716   BILITOT 0.3 04/10/2017 0821   BILIDIR 0.1 09/12/2017 1529   BILIDIR 0.09 04/10/2017 0821   IBILI 0.2 09/12/2017 1529      Component Value Date/Time   TSH 0.57 05/26/2018 0842   TSH 0.54 12/26/2016 0805   TSH 1.40 02/28/2015 1406      OBESITY BEHAVIORAL INTERVENTION VISIT  Today's visit was # 10   Starting weight: 222 lbs Starting  date: 06/17/2018 Today's weight : 230 lbs Today's date: 10/26/2018 Total lbs lost to date: 0    ASK: We discussed the diagnosis of obesity with [name] today and Lori Le agreed to give [name] permission to discuss obesity behavioral modification therapy today.  ASSESS: Rickiya has the diagnosis of obesity and her BMI today is 36.01 Marcedes is in the action stage of change   ADVISE: Jessicaann was educated on the multiple health risks of obesity as well as the benefit of weight loss to improve her health. She was advised of the need for long term treatment and the importance of lifestyle modifications to improve her current health and to decrease her risk of future health problems.  AGREE: Multiple dietary modification options and treatment options  were discussed and  Purva agreed to follow the recommendations documented in the above note.  ARRANGE: Natia was educated on the importance of frequent visits to treat obesity as outlined per CMS and USPSTF guidelines and agreed to schedule her next follow up appointment today.  I, Trixie Dredge, am acting as transcriptionist for Ilene Qua, MD  I have reviewed the above documentation for accuracy and completeness, and I agree with the above. - Ilene Qua, MD

## 2018-10-28 LAB — INSULIN, RANDOM: INSULIN: 13.4 u[IU]/mL (ref 2.6–24.9)

## 2018-10-28 LAB — HEMOGLOBIN A1C
Est. average glucose Bld gHb Est-mCnc: 123 mg/dL
Hgb A1c MFr Bld: 5.9 % — ABNORMAL HIGH (ref 4.8–5.6)

## 2018-10-28 LAB — VITAMIN D 25 HYDROXY (VIT D DEFICIENCY, FRACTURES): Vit D, 25-Hydroxy: 24.5 ng/mL — ABNORMAL LOW (ref 30.0–100.0)

## 2018-11-02 ENCOUNTER — Other Ambulatory Visit: Payer: Self-pay | Admitting: Surgery

## 2018-11-02 DIAGNOSIS — S46011A Strain of muscle(s) and tendon(s) of the rotator cuff of right shoulder, initial encounter: Secondary | ICD-10-CM

## 2018-11-02 DIAGNOSIS — Z9889 Other specified postprocedural states: Secondary | ICD-10-CM

## 2018-11-02 DIAGNOSIS — M7581 Other shoulder lesions, right shoulder: Secondary | ICD-10-CM

## 2018-11-09 ENCOUNTER — Ambulatory Visit (INDEPENDENT_AMBULATORY_CARE_PROVIDER_SITE_OTHER): Payer: Managed Care, Other (non HMO) | Admitting: Family Medicine

## 2018-11-09 ENCOUNTER — Encounter (INDEPENDENT_AMBULATORY_CARE_PROVIDER_SITE_OTHER): Payer: Self-pay | Admitting: Family Medicine

## 2018-11-09 ENCOUNTER — Other Ambulatory Visit: Payer: Self-pay

## 2018-11-09 VITALS — BP 101/68 | HR 88 | Temp 98.1°F | Ht 67.0 in | Wt 229.0 lb

## 2018-11-09 DIAGNOSIS — F320 Major depressive disorder, single episode, mild: Secondary | ICD-10-CM

## 2018-11-09 DIAGNOSIS — Z9189 Other specified personal risk factors, not elsewhere classified: Secondary | ICD-10-CM

## 2018-11-09 DIAGNOSIS — Z6835 Body mass index (BMI) 35.0-35.9, adult: Secondary | ICD-10-CM

## 2018-11-09 DIAGNOSIS — F32A Depression, unspecified: Secondary | ICD-10-CM

## 2018-11-09 DIAGNOSIS — E559 Vitamin D deficiency, unspecified: Secondary | ICD-10-CM

## 2018-11-10 NOTE — Progress Notes (Signed)
Office: [phone]  /  Fax: [phone]   HPI:   Chief Complaint: OBESITY Lori Le is here to discuss her progress with her obesity treatment plan. She is keeping a food journal with 1150-1300 calories and 80 grams of protein and is following her eating plan approximately 50% of the time. She states she is exercising 0 minutes 0 times per week. Lori Le hasn't felt well the last few weeks secondary to fatigue and lack of motivation. She did not journal the days she felt tired. She is not sure if her fatigue is from anemia. She has a rash and a torn rotator cuff which is causing fairly consistent pain.  Her weight is 229 lb (103.9 kg) today and has had a weight loss of 1 pound over a period of 2 weeks since her last visit. She has lost 0 lbs since starting treatment with [name].  Depression  Lori Le is feeling more down and fatigued. She shows no sign of suicidal or homicidal ideations.  Depression screen [facility] 2/9 06/17/2018 01/12/2018 03/21/2017 12/12/2016  Decreased Interest 2 0 0 0  Down, Depressed, Hopeless 3 0 0 0  PHQ - 2 Score 5 0 0 0  Altered sleeping 0 - - 0  Tired, decreased energy 3 - - 0  Change in appetite 2 - - 0  Feeling bad or failure about yourself  3 - - 0  Trouble concentrating 1 - - 0  Moving slowly or fidgety/restless 1 - - 0  Suicidal thoughts 0 - - 0  PHQ-9 Score 15 - - 0  Difficult doing work/chores Not difficult at all - - -  Some recent data might be hidden   Vitamin D deficiency Lori Le has a diagnosis of Vitamin D deficiency. She is currently taking prescription Vit D and denies nausea, vomiting or muscle weakness, but does report fatigue.  At risk for osteopenia and osteoporosis Lori Le is at higher risk of osteopenia and osteoporosis due to Vitamin D deficiency.   ASSESSMENT AND PLAN:  Mild depression ([place])  Vitamin D deficiency  At risk for osteoporosis  Class 2 severe obesity with serious comorbidity and body mass index (BMI) of 35.0 to 35.9 in adult,  unspecified obesity type ([place])  PLAN:  Depression  Lori Le will touch base with her psychiatrist to discuss either increasing her Zoloft dose or switching to an alternative antidepressant.   Vitamin D Deficiency Lori Le was informed that low Vitamin D levels contributes to fatigue and are associated with obesity, breast, and colon cancer. She agrees to continue to take prescription Vit D @ 50,000 IU every week #4 with 0 refills and will follow-up for routine testing of Vitamin D, at least 2-3 times per year. She was informed of the risk of over-replacement of Vitamin D and agrees to not increase her dose unless she discusses this with [name] first. Lori Le agrees to follow-up with our clinic in 2 weeks.  At risk for osteopenia and osteoporosis Lori Le was given extended  (15 minutes) osteoporosis prevention counseling today. Lori Le is at risk for osteopenia and osteoporosis due to her Vitamin D deficiency. She was encouraged to take her Vitamin D and follow her higher calcium diet and increase strengthening exercise to help strengthen her bones and decrease her risk of osteopenia and osteoporosis.  Obesity Lori Le is currently in the action stage of change. As such, her goal is to continue with weight loss efforts. She has agreed to keep a food journal with 1150-1300 calories and 80+ grams of protein daily.  Lori Le has been instructed to work up to a goal of 150 minutes of combined cardio and strengthening exercise per week for weight loss and overall health benefits. We discussed the following Behavioral Modification Strategies today: increasing lean protein intake, increasing vegetables, work on meal planning and easy cooking plans, keeping healthy foods in the home, planning for success, and keep a strict food journal.  Lori Le has agreed to follow-up with our clinic in 2 weeks. She was informed of the importance of frequent follow-up visits to maximize her success with intensive lifestyle modifications for her  multiple health conditions.  ALLERGIES: Allergies  Allergen Reactions  . Contrast Media [Iodinated Diagnostic Agents] Anaphylaxis    MRI dye  . Etodolac Anaphylaxis  . Alpha-Gal     Flu- like symptoms  . Influenza Vaccines Hives  . Lactose Nausea And Vomiting    Can have yogurt Can not have milk and ice cream   . Lactose Intolerance (Gi) Nausea And Vomiting    Can have yogurt Can not have milk and ice cream    . Bacitracin-Neomycin-Polymyxin Rash  . Neomycin-Bacitracin Zn-Polymyx Rash  . Neosporin + Pain Relief Max St [Neomy-Bacit-Polymyx-Pramoxine] Rash    MEDICATIONS: Current Outpatient Medications on File Prior to Visit  Medication Sig Dispense Refill  . allopurinol (ZYLOPRIM) 300 MG tablet TAKE 1 TABLET BY MOUTH EVERY DAY 90 tablet 2  . cyanocobalamin (,VITAMIN B-12,) 1000 MCG/ML injection ADM 1 ML IM Q 30 DAYS    . furosemide (LASIX) 40 MG tablet Take 1 tablet (40 mg total) by mouth daily. 90 tablet 1  . gabapentin (NEURONTIN) 100 MG capsule Take 200 mg by mouth 2 (two) times daily.    Lori Le Kitchen levothyroxine (SYNTHROID) 137 MCG tablet TK 1 T PO QD 30 TO 60 MIN B BRE OES AND WITH A GLASS OF WATER    . metFORMIN (GLUCOPHAGE) 500 MG tablet Take 1 tablet (500 mg total) by mouth daily with breakfast. 90 tablet 0  . metolazone (ZAROXOLYN) 5 MG tablet Take 5 mg by mouth daily as needed.     . montelukast (SINGULAIR) 10 MG tablet Take 10 mg by mouth at bedtime.    . pantoprazole (PROTONIX) 40 MG tablet Take 1 tablet (40 mg total) by mouth daily. 90 tablet 1  . polyethylene glycol powder (GLYCOLAX/MIRALAX) 17 GM/SCOOP powder Take 17 g by mouth daily as needed for moderate constipation. 255 g 0  . potassium chloride SA (K-DUR,KLOR-CON) 20 MEQ tablet Take two tablets (40 meq) by mouth daily in the morning and 1 tablet (20 meq) by mouth daily in the evening 90 tablet 0  . sertraline (ZOLOFT) 50 MG tablet Take 1.5 tablets (75 mg total) by mouth daily. 135 tablet 0  . triamcinolone (KENALOG)  0.025 % cream APP EXT AA BID    . triamterene-hydrochlorothiazide (MAXZIDE-25) 37.5-25 MG tablet Take 1 tablet by mouth daily. 90 tablet 1  . Dupilumab (DUPIXENT) 300 MG/2ML SOSY     . linaclotide (LINZESS) 145 MCG CAPS capsule Take 1 capsule (145 mcg total) by mouth daily before breakfast for 30 days. 30 capsule 1  . omalizumab (XOLAIR) 150 MG/ML prefilled syringe Inject into the skin every 28 (twenty-eight) days.     Current Facility-Administered Medications on File Prior to Visit  Medication Dose Route Frequency Provider Last Rate Last Dose  . 0.9 %  sodium chloride infusion   Intravenous Continuous Lori Asal, MD 0 mL/hr at 05/08/17 1437    . cyanocobalamin ((VITAMIN B-12)) injection 1,000 mcg  1,000 mcg Intramuscular Once Lori Asal, MD        PAST MEDICAL HISTORY: Past Medical History:  Diagnosis Date  . Allergic rhinitis due to allergen   . Allergy   . Anemia   . Anxiety   . Arthritis   . B12 deficiency   . Cancer ([place])    skin ca  . Chronic idiopathic urticaria   . Complication of anesthesia    nausea, slow to wake up  . Depression   . Edema   . Family history of adverse reaction to anesthesia    Father - PONV  . Gastric ulcer   . GERD (gastroesophageal reflux disease)   . Gout   . Gout   . Hashimoto's thyroiditis   . Heart murmur   . History of eating disorder   . Hives   . Hyperlipidemia   . Hypertension   . Hypothyroidism   . Lower extremity edema   . Melanoma in situ ([place])    left shoulder  . Migraine    approx 4x/yr  . Mitral valve disorder   . Motion sickness    all moving vehicles  . Neuropathy    fingers and feet  . PCOS (polycystic ovarian syndrome)   . PONV (postoperative nausea and vomiting)   . Thyroid disease   . Ulcer   . Vertigo    last episode over 1 yr ago    PAST SURGICAL HISTORY: Past Surgical History:  Procedure Laterality Date  . BONE EXCISION Left 09/03/2018   Procedure: PART EXCISION BONE-PHALANX 2,3,4 LEFT;   Surgeon: Albertine Patricia, DPM;  Location: [place];  Service: Podiatry;  Laterality: Left;  . BREAST BIOPSY Right 2007   neg- bx/clip  . CHOLECYSTECTOMY  2003  . COLONOSCOPY WITH PROPOFOL N/A 05/11/2018   Procedure: COLONOSCOPY WITH PROPOFOL;  Surgeon: [name], MD;  Location: [facility] ENDOSCOPY;  Service: Endoscopy;  Laterality: N/A;  . DILITATION & CURRETTAGE/HYSTROSCOPY WITH NOVASURE ABLATION N/A 08/19/2016   Procedure: DILATATION & CURETTAGE/HYSTEROSCOPY WITH NOVASURE ABLATION;  Surgeon: [name], MD;  Location: [facility] ORS;  Service: Gynecology;  Laterality: N/A;  . ESOPHAGOGASTRODUODENOSCOPY (EGD) WITH PROPOFOL N/A 12/05/2015   Procedure: ESOPHAGOGASTRODUODENOSCOPY (EGD) WITH PROPOFOL;  Surgeon: Lollie Sails, MD;  Location: [facility] ENDOSCOPY;  Service: Endoscopy;  Laterality: N/A;  . ESOPHAGOGASTRODUODENOSCOPY (EGD) WITH PROPOFOL N/A 02/06/2016   Procedure: ESOPHAGOGASTRODUODENOSCOPY (EGD) WITH PROPOFOL;  Surgeon: Lollie Sails, MD;  Location[facility] ENDOSCOPY;  Service: Endoscopy;  Laterality: N/A;  . ESOPHAGOGASTRODUODENOSCOPY (EGD) WITH PROPOFOL N/A 05/11/2018   Procedure: ESOPHAGOGASTRODUODENOSCOPY (EGD) WITH PROPOFOL;  Surgeon: [name], MD;  Location: [facility] ENDOSCOPY;  Service: Endoscopy;  Laterality: N/A;  . ESOPHAGOGASTRODUODENOSCOPY (EGD) WITH PROPOFOL N/A 09/08/2018   Procedure: ESOPHAGOGASTRODUODENOSCOPY (EGD) WITH BIOPSY;  Surgeon: [name], MD;  Location: [place];  Service: Endoscopy;  Laterality: N/A;  . FOOT ARTHRODESIS Left 09/03/2018   Procedure: ARTHRODESIS; HALLUX/IP JOINT LEFT;  Surgeon: Albertine Patricia, DPM;  Location: [place];  Service: Podiatry;  Laterality: Left;  LMA LOCAL  . FOOT SURGERY  1994  . GASTRIC BYPASS  2011  . JOINT REPLACEMENT Left 09/27/2014   HIP  . JOINT REPLACEMENT Left 12/01/2014   KNEE  . KNEE ARTHROSCOPY Right   . KNEE ARTHROSCOPY Left 03/23/2015   Procedure: ARTHROSCOPY KNEE,  partial synovectomy;  Surgeon: Hessie Knows, MD;  Location: [facility] ORS;  Service: Orthopedics;  Laterality: Left;  . LAPAROSCOPIC GASTRIC BANDING  2010  . POLYPECTOMY N/A 09/08/2018  Procedure: POLYPECTOMY;  Surgeon: [name], MD;  Location: [place];  Service: Endoscopy;  Laterality: N/A;  Stomach  . ROTATOR CUFF REPAIR Bilateral    right shoulder 07-26-2016  . SHOULDER ARTHROSCOPY Left 06/20/2015   Procedure: ARTHROSCOPY SHOULDER, REPAIR OF MASSIVE ROTATOR CUFF TEAR, TENODESIS, DECOMPRESSION DEBRIDEMENT;  Surgeon: Corky Mull, MD;  Location: [facility] ORS;  Service: Orthopedics;  Laterality: Left;  . TOTAL HIP ARTHROPLASTY Left 09/27/2014   Procedure: TOTAL HIP ARTHROPLASTY ANTERIOR APPROACH;  Surgeon: Hessie Knows, MD;  Location: [facility];  Service: Orthopedics;  Laterality: Left;  . TOTAL KNEE ARTHROPLASTY Left 12/01/2014   Procedure: TOTAL KNEE ARTHROPLASTY;  Surgeon: Hessie Knows, MD;  Location: [facility];  Service: Orthopedics;  Laterality: Left;    SOCIAL HISTORY: Social History   Tobacco Use  . Smoking status: Never Smoker  . Smokeless tobacco: Never Used  Substance Use Topics  . Alcohol use: Yes    Alcohol/week: 0.0 - 1.0 standard drinks    Comment: occ.  . Drug use: No    FAMILY HISTORY: Family History  Problem Relation Age of Onset  . Arthritis Mother   . Hyperlipidemia Mother   . Hypertension Mother   . Diabetes Mother   . Sleep apnea Mother   . Obesity Mother   . Arthritis Father   . Hyperlipidemia Father   . Hypertension Father   . Mental illness Father   . Diabetes Father   . Obesity Father   . Sleep apnea Father   . Bipolar disorder Father   . Anxiety disorder Father   . Depression Father   . Cancer Father   . Kidney disease Father   . Arthritis Maternal Grandmother   . Cancer Maternal Grandmother        breast cancer  . Hyperlipidemia Maternal Grandmother   . Hypertension Maternal Grandmother   . Breast cancer Maternal Grandmother   .  Arthritis Maternal Grandfather   . Hyperlipidemia Maternal Grandfather   . Hypertension Maternal Grandfather   . Heart disease Maternal Grandfather        heart attack  . Breast cancer Paternal Grandmother    ROS: Review of Systems  Constitutional: Positive for malaise/fatigue.  Gastrointestinal: Negative for nausea and vomiting.  Musculoskeletal:       Negative for muscle weakness.  Psychiatric/Behavioral: Positive for depression. Negative for suicidal ideas.       Negative for homicidal ideas.   PHYSICAL EXAM: Blood pressure 101/68, pulse 88, temperature 98.1 F (36.7 C), temperature source Oral, height 5\' 7"  (1.702 m), weight 229 lb (103.9 kg), SpO2 96 %. Body mass index is 35.87 kg/m. Physical Exam Vitals signs reviewed.  Constitutional:      Appearance: Normal appearance. She is obese.  Cardiovascular:     Rate and Rhythm: Normal rate.     Pulses: Normal pulses.  Pulmonary:     Effort: Pulmonary effort is normal.     Breath sounds: Normal breath sounds.  Musculoskeletal: Normal range of motion.  Skin:    General: Skin is warm and dry.  Neurological:     Mental Status: She is alert and oriented to person, place, and time.  Psychiatric:        Behavior: Behavior normal.   RECENT LABS AND TESTS: BMET    Component Value Date/Time   NA 139 09/01/2018 1527   K 3.7 09/01/2018 1527   K 4.0 09/27/2014   CL 92 (L) 09/01/2018 1527   CO2 30 (H) 09/01/2018 1527  GLUCOSE 101 (H) 09/01/2018 1527   GLUCOSE 102 (H) 08/25/2018 1716   BUN 16 09/01/2018 1527   CREATININE 0.98 09/01/2018 1527   CREATININE 0.81 09/12/2017 1529   CALCIUM 8.8 09/01/2018 1527   GFRNONAA 67 09/01/2018 1527   GFRAA 77 09/01/2018 1527   Lab Results  Component Value Date   HGBA1C 5.9 (H) 10/27/2018   HGBA1C 5.8 05/26/2018   HGBA1C 5.5 04/10/2017   HGBA1C 5.4 04/07/2015   Lab Results  Component Value Date   INSULIN 13.4 10/27/2018   INSULIN 12.2 06/17/2018   CBC    Component Value  Date/Time   WBC 10.8 (H) 08/25/2018 1716   RBC 4.45 08/25/2018 1716   HGB 13.6 08/25/2018 1716   HGB 14.2 07/09/2018 1459   HCT 41.5 08/25/2018 1716   HCT 40.4 07/09/2018 1459   PLT 357 08/25/2018 1716   PLT 316 07/09/2018 1459   MCV 93.3 08/25/2018 1716   MCV 88 07/09/2018 1459   MCH 30.6 08/25/2018 1716   MCHC 32.8 08/25/2018 1716   RDW 13.2 08/25/2018 1716   RDW 13.0 07/09/2018 1459   LYMPHSABS 2.6 07/09/2018 1459   MONOABS 0.2 05/22/2018 1540   EOSABS 0.1 07/09/2018 1459   BASOSABS 0.1 07/09/2018 1459   Iron/TIBC/Ferritin/ %Sat    Component Value Date/Time   IRON 31 05/22/2018 1540   TIBC 373 05/22/2018 1540   FERRITIN 38 08/25/2018 1425   FERRITIN 42 04/07/2015 0938   IRONPCTSAT 8 (L) 05/22/2018 1540   Lipid Panel     Component Value Date/Time   CHOL 178 05/26/2018 0842   CHOL 202 (H) 04/10/2017 0821   TRIG 256.0 (H) 05/26/2018 0842   HDL 62.60 05/26/2018 0842   HDL 67 04/10/2017 0821   CHOLHDL 3 05/26/2018 0842   VLDL 51.2 (H) 05/26/2018 0842   LDLCALC 82 04/10/2017 0821   LDLDIRECT 106.0 05/26/2018 0842   Hepatic Function Panel     Component Value Date/Time   PROT 7.2 08/25/2018 1716   PROT 6.7 04/10/2017 0821   ALBUMIN 4.3 08/25/2018 1716   ALBUMIN 4.2 04/10/2017 0821   AST 11 (L) 08/25/2018 1716   ALT 16 08/25/2018 1716   ALKPHOS 54 08/25/2018 1716   BILITOT 0.5 08/25/2018 1716   BILITOT 0.3 04/10/2017 0821   BILIDIR 0.1 09/12/2017 1529   BILIDIR 0.09 04/10/2017 0821   IBILI 0.2 09/12/2017 1529      Component Value Date/Time   TSH 0.57 05/26/2018 0842   TSH 0.54 12/26/2016 0805   TSH 1.40 02/28/2015 1406   Results for MEGANNE, RITA (MRN [id number]) as of 11/10/2018 11:30  Ref. Range 10/27/2018 09:40  Vitamin D, 25-Hydroxy Latest Ref Range: 30.0 - 100.0 ng/mL 24.5 (L)   OBESITY BEHAVIORAL INTERVENTION VISIT  Today's visit was #11  Starting weight: 222 lbs Starting date: 06/17/2018 Today's weight: 229 lbs Today's date: 11/09/2018 Total  lbs lost to date: 0    11/09/2018  Height 5\' 7"  (1.702 m)  Weight 229 lb (103.9 kg)  BMI (Calculated) 35.86  BLOOD PRESSURE - SYSTOLIC 425  BLOOD PRESSURE - DIASTOLIC 68   Body Fat % 44 %  Total Body Water (lbs) 81.4 lbs   ASK: We discussed the diagnosis of obesity with Lori Le today and Lori Le agreed to give [name] permission to discuss obesity behavioral modification therapy today.  ASSESS: Kelie has the diagnosis of obesity and her BMI today is 35.9. Jaylah is in the action stage of change.   ADVISE: Melda was educated  on the multiple health risks of obesity as well as the benefit of weight loss to improve her health. She was advised of the need for long term treatment and the importance of lifestyle modifications to improve her current health and to decrease her risk of future health problems.  AGREE: Multiple dietary modification options and treatment options were discussed and  [place]y agreed to follow the recommendations documented in the above note.  ARRANGE: Ayven was educated on the importance of frequent visits to treat obesity as outlined per CMS and USPSTF guidelines and agreed to schedule her next follow up appointment today.  I, Michaelene Song, am acting as transcriptionist for Ilene Qua, MD  I have reviewed the above documentation for accuracy and completeness, and I agree with the above. - Ilene Qua, MD

## 2018-11-13 ENCOUNTER — Other Ambulatory Visit (HOSPITAL_COMMUNITY): Payer: Self-pay | Admitting: Psychiatry

## 2018-11-13 DIAGNOSIS — F411 Generalized anxiety disorder: Secondary | ICD-10-CM

## 2018-11-13 DIAGNOSIS — F330 Major depressive disorder, recurrent, mild: Secondary | ICD-10-CM

## 2018-11-15 ENCOUNTER — Ambulatory Visit
Admission: RE | Admit: 2018-11-15 | Discharge: 2018-11-15 | Disposition: A | Discharge status: Home / Self Care | Payer: Managed Care, Other (non HMO) | Source: Ambulatory Visit | Attending: Surgery | Admitting: Surgery

## 2018-11-15 DIAGNOSIS — M7581 Other shoulder lesions, right shoulder: Secondary | ICD-10-CM | POA: Insufficient documentation

## 2018-11-15 DIAGNOSIS — Z9889 Other specified postprocedural states: Secondary | ICD-10-CM | POA: Insufficient documentation

## 2018-11-15 DIAGNOSIS — S46011A Strain of muscle(s) and tendon(s) of the rotator cuff of right shoulder, initial encounter: Secondary | ICD-10-CM | POA: Insufficient documentation

## 2018-11-17 ENCOUNTER — Ambulatory Visit (HOSPITAL_COMMUNITY): Payer: 59 | Admitting: Psychiatry

## 2018-11-17 ENCOUNTER — Other Ambulatory Visit: Payer: Self-pay

## 2018-11-18 ENCOUNTER — Ambulatory Visit (INDEPENDENT_AMBULATORY_CARE_PROVIDER_SITE_OTHER): Payer: 59 | Admitting: Psychiatry

## 2018-11-18 ENCOUNTER — Inpatient Hospital Stay: Payer: Managed Care, Other (non HMO) | Attending: Hematology and Oncology | Admitting: Oncology

## 2018-11-18 ENCOUNTER — Encounter (HOSPITAL_COMMUNITY): Payer: Self-pay | Admitting: Psychiatry

## 2018-11-18 ENCOUNTER — Other Ambulatory Visit: Payer: Self-pay

## 2018-11-18 DIAGNOSIS — F330 Major depressive disorder, recurrent, mild: Secondary | ICD-10-CM | POA: Diagnosis not present

## 2018-11-18 DIAGNOSIS — F411 Generalized anxiety disorder: Secondary | ICD-10-CM

## 2018-11-18 DIAGNOSIS — D509 Iron deficiency anemia, unspecified: Secondary | ICD-10-CM | POA: Insufficient documentation

## 2018-11-18 DIAGNOSIS — K9589 Other complications of other bariatric procedure: Secondary | ICD-10-CM

## 2018-11-18 LAB — COMPREHENSIVE METABOLIC PANEL
ALT: 15 U/L (ref 0–44)
AST: 12 U/L — ABNORMAL LOW (ref 15–41)
Albumin: 3.8 g/dL (ref 3.5–5.0)
Alkaline Phosphatase: 48 U/L (ref 38–126)
Anion gap: 13 (ref 5–15)
BUN: 22 mg/dL — ABNORMAL HIGH (ref 6–20)
CO2: 26 mmol/L (ref 22–32)
Calcium: 8.9 mg/dL (ref 8.9–10.3)
Chloride: 97 mmol/L — ABNORMAL LOW (ref 98–111)
Creatinine, Ser: 0.71 mg/dL (ref 0.44–1.00)
GFR calc Af Amer: >60 mL/min (ref >60)
GFR calc non Af Amer: >60 mL/min (ref >60)
Glucose, Bld: 104 mg/dL — ABNORMAL HIGH (ref 70–99)
Potassium: 3.2 mmol/L — ABNORMAL LOW (ref 3.5–5.1)
Sodium: 136 mmol/L (ref 135–145)
Total Bilirubin: 0.4 mg/dL (ref 0.3–1.2)
Total Protein: 6.9 g/dL (ref 6.5–8.1)

## 2018-11-18 LAB — FERRITIN: Ferritin: 23 ng/mL (ref 11–307)

## 2018-11-18 MED ORDER — SERTRALINE HCL 100 MG PO TABS: 100.0000 mg | ORAL_TABLET | Freq: Every day | ORAL | 0 refills | Status: DC
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
## 2018-11-19 NOTE — Progress Notes (Signed)
Spoke to patient and reviewed lab results.  She is agreeable to begin IV Venofer next week.  Her appointment is on 11/25/2018.  She is on the schedule to receive IV iron.  We discussed her low potassium level.  She is currently taking 20 mEq in the morning and 20 mEq in the evening (40 meq daily).  Per review of her prescription by Dr. Nicki Reaper she is supposed to be taking 40 mEq in the morning and 20 mEq in the evening (60 mEq daily)  She will start the increased dosing today.  Rulon Abide, NP

## 2018-11-23 ENCOUNTER — Ambulatory Visit (INDEPENDENT_AMBULATORY_CARE_PROVIDER_SITE_OTHER): Payer: Managed Care, Other (non HMO) | Admitting: Family Medicine

## 2018-11-23 ENCOUNTER — Other Ambulatory Visit: Payer: Self-pay

## 2018-11-24 ENCOUNTER — Other Ambulatory Visit: Payer: Self-pay

## 2018-11-24 NOTE — Progress Notes (Signed)
Confirmed Name, DOB, and Address. Denies any concerns at this time.  

## 2018-11-24 NOTE — Progress Notes (Signed)
[facility]  [address] [place], [place] [address] Phone: [phone]  Fax: [phone]   Clinic Day:  11/25/2018  Referring physician: Einar Pheasant, MD  Chief Complaint: Lori Le is a 53 y.o. female s/p gastric bypass surgery and subsequent iron deficiency and B12 deficiency who is seen for 6 month assessment.  HPI: The patient was last seen in the hematology clinic on 05/25/2018. At that time, she noted the gradual onset of fatigue.  Exam was unremarkable.  She received Venofer x2 (05/25/2018 - 06/02/2018).  Bilateral screening mammogram on 06/03/2018 revealed no evidence of malignancy.   She was seen in the [facility] ER on 08/25/2018 for dysuria and intermittent lower abdominal discomfort. CT renal stone study revealed prominent stool burden in the colon favoring constipation. She was discharged with antibiotics and Miralax.   EGD on 09/08/2018 revealed salmon-colored mucosa suspicious for short-segment Barrett's esophagus. There were a few gastric polyps. Roux-en-Y gastrojejunostomy with gastrojejunal anastomosis characterized by healthy appearing mucosa and an intact appearance was noted. Normal examined jejunum.  Pathology revealed gastric xanthoma, fundic gland polyp, and oxyntic gland hyperplasia, consistent with proton pump inhibitor effect and negative for H pylori, intestinal metaplasia, dysplasia, and malignancy. There was squamocolumnar mucosa with mild chronic inflammation.   She was contacted regarding low potassium on 11/18/2018. She corrected her intake to 64mEq in the morning and 51mEq in the evening.   Labs followed: 07/09/2018: hematocrit 40.4, hemoglobin 14.2, MCV 88, platelets 316,000, WBC 10,500.  08/25/2018: hematocrit 41.5, hemoglobin 13.6, MCV 93.3, platelets 357,000, WBC 10,800.   Ferritin 38.  Sodium 134, potassium 3.0, calcium 8.7. 11/18/2018: Sodium 136, potassium 3.2, calcium 8.9. Ferritin 23.  During the interim,  she has felt "alright".  However, she feels that she is "falling apart".  She states notes that her toe is not healing s/p foot surgery.  She has a horrible rash.  She has seen two  dermatologists.  She has tried shots, creams and shampoos and has been sent to rheumatology.  She has a torn rotator cuff (complete) and needs surgery.    She states that she is taking potassium 40 mEq in the morning and 20 mEq in the evening.  She feels tired.  She feels a little dizzy when bending over.  She is sleeping until 8:30.  She notes restless legs, but denies any ice pica.  She received a B12 injection about 2 to 3 weeks ago.   Past Medical History:  Diagnosis Date   Allergic rhinitis due to allergen    Allergy    Anemia    Anxiety    Arthritis    B12 deficiency    Cancer (HCC)    skin ca   Chronic idiopathic urticaria    Complication of anesthesia    nausea, slow to wake up   Depression    Edema    Family history of adverse reaction to anesthesia    Father - PONV   Gastric ulcer    GERD (gastroesophageal reflux disease)    Gout    Gout    Hashimoto's thyroiditis    Heart murmur    History of eating disorder    Hives    Hyperlipidemia    Hypertension    Hypothyroidism    Lower extremity edema    Melanoma in situ ([place])    left shoulder   Migraine    approx 4x/yr   Mitral valve disorder    Motion sickness    all  moving vehicles   Neuropathy    fingers and feet   PCOS (polycystic ovarian syndrome)    PONV (postoperative nausea and vomiting)    Thyroid disease    Ulcer    Vertigo    last episode over 1 yr ago    Past Surgical History:  Procedure Laterality Date   BONE EXCISION Left 09/03/2018   Procedure: PART EXCISION BONE-PHALANX 2,3,4 LEFT;  Surgeon: Albertine Patricia, DPM;  Location: [place];  Service: Podiatry;  Laterality: Left;   BREAST BIOPSY Right 2007   neg- bx/clip   CHOLECYSTECTOMY  2003   COLONOSCOPY WITH PROPOFOL  N/A 05/11/2018   Procedure: COLONOSCOPY WITH PROPOFOL;  Surgeon: [name], MD;  Location: [facility] ENDOSCOPY;  Service: Endoscopy;  Laterality: N/A;   DILITATION & CURRETTAGE/HYSTROSCOPY WITH NOVASURE ABLATION N/A 08/19/2016   Procedure: DILATATION & CURETTAGE/HYSTEROSCOPY WITH NOVASURE ABLATION;  Surgeon: [name], MD;  Location: [facility];  Service: Gynecology;  Laterality: N/A;   ESOPHAGOGASTRODUODENOSCOPY (EGD) WITH PROPOFOL N/A 12/05/2015   Procedure: ESOPHAGOGASTRODUODENOSCOPY (EGD) WITH PROPOFOL;  Surgeon: Lollie Sails, MD;  Location: [facility] ENDOSCOPY;  Service: Endoscopy;  Laterality: N/A;   ESOPHAGOGASTRODUODENOSCOPY (EGD) WITH PROPOFOL N/A 02/06/2016   Procedure: ESOPHAGOGASTRODUODENOSCOPY (EGD) WITH PROPOFOL;  Surgeon: Lollie Sails, MD;  Location: [facility];  Service: Endoscopy;  Laterality: N/A;   ESOPHAGOGASTRODUODENOSCOPY (EGD) WITH PROPOFOL N/A 05/11/2018   Procedure: ESOPHAGOGASTRODUODENOSCOPY (EGD) WITH PROPOFOL;  Surgeon: [name], MD;  Location: [facility] ENDOSCOPY;  Service: Endoscopy;  Laterality: N/A;   ESOPHAGOGASTRODUODENOSCOPY (EGD) WITH PROPOFOL N/A 09/08/2018   Procedure: ESOPHAGOGASTRODUODENOSCOPY (EGD) WITH BIOPSY;  Surgeon: [name], MD;  Location: [place];  Service: Endoscopy;  Laterality: N/A;   FOOT ARTHRODESIS Left 09/03/2018   Procedure: ARTHRODESIS; HALLUX/IP JOINT LEFT;  Surgeon: Albertine Patricia, DPM;  Location: [place];  Service: Podiatry;  Laterality: Left;  [place]   GASTRIC BYPASS  2011   JOINT REPLACEMENT Left 09/27/2014   HIP   JOINT REPLACEMENT Left 12/01/2014   KNEE   KNEE ARTHROSCOPY Right    KNEE ARTHROSCOPY Left 03/23/2015   Procedure: ARTHROSCOPY KNEE, partial synovectomy;  Surgeon: Hessie Knows, MD;  Location: [facility];  Service: Orthopedics;  Laterality: Left;   LAPAROSCOPIC GASTRIC BANDING  2010   POLYPECTOMY N/A 09/08/2018   Procedure:  POLYPECTOMY;  Surgeon: [name], MD;  Location: [place];  Service: Endoscopy;  Laterality: N/A;  Stom[place]   ROTATOR CUFF REPAIR Bilateral    right shoulder 07-26-2016   SHOULDER ARTHROSCOPY Left 06/20/2015   Procedure: ARTHROSCOPY SHOULDER, REPAIR OF MASSIVE ROTATOR CUFF TEAR, TENODESIS, DECOMPRESSION DEBRIDEMENT;  Surgeon: Corky Mull, MD;  Location: [facility];  Service: Orthopedics;  Laterality: Left;   TOTAL HIP ARTHROPLASTY Left 09/27/2014   Procedure: TOTAL HIP ARTHROPLASTY ANTERIOR APPROACH;  Surgeon: Hessie Knows, MD;  Location: [facility];  Service: Orthopedics;  Laterality: Left;   TOTAL KNEE ARTHROPLASTY Left 12/01/2014   Procedure: TOTAL KNEE ARTHROPLASTY;  Surgeon: Hessie Knows, MD;  Location: [facility];  Service: Orthopedics;  Laterality: Left;    Family History  Problem Relation Age of Onset   Arthritis Mother    Hyperlipidemia Mother    Hypertension Mother    Diabetes Mother    Sleep apnea Mother    Obesity Mother    Arthritis Father    Hyperlipidemia Father    Hypertension Father    Mental illness Father    Diabetes Father    Obesity  Father    Sleep apnea Father    Bipolar disorder Father    Anxiety disorder Father    Depression Father    Cancer Father    Kidney disease Father    Arthritis Maternal Grandmother    Cancer Maternal Grandmother        breast cancer   Hyperlipidemia Maternal Grandmother    Hypertension Maternal Grandmother    Breast cancer Maternal Grandmother    Arthritis Maternal Grandfather    Hyperlipidemia Maternal Grandfather    Hypertension Maternal Grandfather    Heart disease Maternal Grandfather        heart attack   Breast cancer Paternal Grandmother     Social History:  reports that she has never smoked. She has never used smokeless tobacco. She reports current alcohol use. She reports that she does not use drugs. The patient lives in [place].  The patient is alone today.  Allergies:    Allergies  Allergen Reactions   Contrast Media [Iodinated Diagnostic Agents] Anaphylaxis    MRI dye   Etodolac Anaphylaxis   Alpha-Gal     Flu- like symptoms   Influenza Vaccines Hives   Lactose Nausea And Vomiting    Can have yogurt Can not have milk and ice cream    Lactose Intolerance (Gi) Nausea And Vomiting    Can have yogurt Can not have milk and ice cream     Bacitracin-Neomycin-Polymyxin Rash   Neomycin-Bacitracin Zn-Polymyx Rash   Neosporin + Pain Relief Max St [Neomy-Bacit-Polymyx-Pramoxine] Rash    Current Medications: Current Outpatient Medications  Medication Sig Dispense Refill   allopurinol (ZYLOPRIM) 300 MG tablet TAKE 1 TABLET BY MOUTH EVERY DAY 90 tablet 2   clobetasol ointment (TEMOVATE) [id number] % Apply 1 application topically at bedtime.      cyanocobalamin (,VITAMIN B-12,) 1000 MCG/ML injection ADM 1 ML IM Q 30 DAYS     furosemide (LASIX) 40 MG tablet Take 1 tablet (40 mg total) by mouth daily. 90 tablet 1   gabapentin (NEURONTIN) 300 MG capsule 2 (two) times daily.     levothyroxine (SYNTHROID) 137 MCG tablet Take 137 mcg by mouth daily before breakfast.      metFORMIN (GLUCOPHAGE) 500 MG tablet Take 1 tablet (500 mg total) by mouth daily with breakfast. 90 tablet 0   metolazone (ZAROXOLYN) 5 MG tablet Take 5 mg by mouth daily as needed.      montelukast (SINGULAIR) 10 MG tablet Take 10 mg by mouth at bedtime.     pantoprazole (PROTONIX) 40 MG tablet Take 1 tablet (40 mg total) by mouth daily. 90 tablet 1   sertraline (ZOLOFT) 100 MG tablet Take 1 tablet (100 mg total) by mouth daily. 90 tablet 0   triamcinolone (KENALOG) 0.025 % cream APP EXT AA BID     triamterene-hydrochlorothiazide (MAXZIDE-25) 37.5-25 MG tablet Take 1 tablet by mouth daily. 90 tablet 1   polyethylene glycol powder (GLYCOLAX/MIRALAX) 17 GM/SCOOP powder Take 17 g by mouth daily as needed for moderate constipation. (Patient not taking: Reported on 11/24/2018) 255 g 0    potassium chloride SA (K-DUR) 20 MEQ tablet Take two tablets (40 meq) by mouth daily in the morning and 1 tablet (20 meq) by mouth daily in the evening 90 tablet 3   No current facility-administered medications for this visit.    Facility-Administered Medications Ordered in Other Visits  Medication Dose Route Frequency Provider Last Rate Last Dose   0.9 %  sodium chloride infusion   Intravenous Continuous Rinda Rollyson,  Drue Second, MD 0 mL/hr at 05/08/17 1437     cyanocobalamin ((VITAMIN B-12)) injection 1,000 mcg  1,000 mcg Intramuscular Once Lequita Asal, MD        Review of Systems  Constitutional: Positive for malaise/fatigue. Negative for chills, diaphoresis, fever and weight loss (up 14 pounds).       Feels "alright", but notes "falling apart".  HENT: Negative.  Negative for congestion, ear discharge, ear pain, hearing loss, nosebleeds, sinus pain and sore throat.   Eyes: Negative.  Negative for blurred vision, double vision and photophobia.  Respiratory: Positive for shortness of breath (climbing stairs). Negative for cough, sputum production and wheezing.   Cardiovascular: Negative.  Negative for chest pain, palpitations, orthopnea, leg swelling and PND.  Gastrointestinal: Negative.  Negative for blood in stool, constipation, diarrhea, melena and nausea.  Genitourinary: Negative.  Negative for dysuria, frequency, hematuria and urgency.  Musculoskeletal: Negative for back pain, joint pain and myalgias.       Torn rotator cuff.  Toe not healing.  Skin: Positive for rash (see HPI).  Neurological: Negative for dizziness, tingling, sensory change, speech change, focal weakness, weakness and headaches.       Dizzy when bending over.  Endo/Heme/Allergies: Does not bruise/bleed easily.       Thyroid disease on Synthroid.   Psychiatric/Behavioral: Negative.  Negative for depression and substance abuse. The patient is not nervous/anxious and does not have insomnia.   All other systems  reviewed and are negative.  Performance status (ECOG): 1  Vitals Blood pressure 118/66, pulse 78, resp. rate 18, height 5\' 7"  (1.702 m), weight 239 lb 12 oz (108.7 kg), SpO2 99 %.   Physical Exam  Constitutional: She is oriented to person, place, and time. She appears well-developed and well-nourished. No distress.  HENT:  Head: Normocephalic and atraumatic.  Mouth/Throat: Oropharynx is clear and moist. No oropharyngeal exudate.  Short blonde hair.  Mask.  Eyes: Pupils are equal, round, and reactive to light. Conjunctivae and EOM are normal. No scleral icterus.  Hazel eyes.  Neck: Normal range of motion. Neck supple. No JVD present.  Cardiovascular: Normal rate, regular rhythm and normal heart sounds. Exam reveals no gallop and no friction rub.  No murmur heard. Pulmonary/Chest: Effort normal and breath sounds normal. No respiratory distress. She has no wheezes. She has no rales.  Abdominal: Soft. Bowel sounds are normal. She exhibits no distension and no mass. There is no abdominal tenderness. There is no rebound and no guarding.  Musculoskeletal: Normal range of motion.        General: No edema.  Lymphadenopathy:    She has no cervical adenopathy.    She has no axillary adenopathy.       Right: No supraclavicular adenopathy present.       Left: No supraclavicular adenopathy present.  Neurological: She is alert and oriented to person, place, and time.  Skin: Skin is warm and dry. She is not diaphoretic. No erythema. No pallor.  Psychiatric: She has a normal mood and affect. Her behavior is normal. Judgment and thought content normal.  Nursing note and vitals reviewed.   Infusion on 11/25/2018  Component Date Value Ref Range Status   Sodium 11/25/2018 134* 135 - 145 mmol/L Final   Potassium 11/25/2018 3.2* 3.5 - 5.1 mmol/L Final   Chloride 11/25/2018 96* 98 - 111 mmol/L Final   CO2 11/25/2018 28  22 - 32 mmol/L Final   Glucose, Bld 11/25/2018 98  70 - 99 mg/dL Final  BUN 11/25/2018 14  6 - 20 mg/dL Final   Creatinine, Ser 11/25/2018 0.61  0.44 - 1.00 mg/dL Final   Calcium 11/25/2018 8.6* 8.9 - 10.3 mg/dL Final   GFR calc non Af Amer 11/25/2018 >60  >60 mL/min Final   GFR calc Af Amer 11/25/2018 >60  >60 mL/min Final   Anion gap 11/25/2018 10  5 - 15 Final   Performed at [facility] [address] [place], [place] [address]   WBC 11/25/2018 10.4  4.0 - 10.5 K/uL Final   RBC 11/25/2018 4.50  3.87 - 5.11 MIL/uL Final   Hemoglobin 11/25/2018 13.5  12.0 - 15.0 g/dL Final   HCT 11/25/2018 41.0  36.0 - 46.0 % Final   MCV 11/25/2018 91.1  80.0 - 100.0 fL Final   MCH 11/25/2018 30.0  26.0 - 34.0 pg Final   MCHC 11/25/2018 32.9  30.0 - 36.0 g/dL Final   RDW 11/25/2018 13.7  11.5 - 15.5 % Final   Platelets 11/25/2018 270  150 - 400 K/uL Final   nRBC 11/25/2018 0.0  0.0 - 0.2 % Final   Neutrophils Relative % 11/25/2018 71  % Final   Neutro Abs 11/25/2018 7.5  1.7 - 7.7 K/uL Final   Lymphocytes Relative 11/25/2018 20  % Final   Lymphs Abs 11/25/2018 2.0  0.7 - 4.0 K/uL Final   Monocytes Relative 11/25/2018 7  % Final   Monocytes Absolute 11/25/2018 0.7  0.1 - 1.0 K/uL Final   Eosinophils Relative 11/25/2018 1  % Final   Eosinophils Absolute 11/25/2018 0.1  0.0 - 0.5 K/uL Final   Basophils Relative 11/25/2018 1  % Final   Basophils Absolute 11/25/2018 0.1  0.0 - 0.1 K/uL Final   Immature Granulocytes 11/25/2018 0  % Final   Abs Immature Granulocytes 11/25/2018 0.04  0.00 - 0.07 K/uL Final   Performed at [facility], [address], [place], [place] [address]    Assessment:  Lori Le is a 53 y.o. female s/p gastric bypass surgery(2011) with iron deficiency anemia. She has a history of bleeding ulcerin 10/2015. She hadheavy menses. She underwent uterine ablation on 08/19/2016.  She bruises easily.  EGDon 12/05/2015 revealed edema and erythema of the gastrojejunal anastomosis. She was treated  with Carafate and Protonix. Repeat EGDon 02/06/2016 revealed minimal erythema on the tips of some gastric folds.   EGD on 05/11/2018 revealed salmon-colored mucosa suspicious for short-segment Barrett's esophagus. Pathology revealed squamocolumnar mucosa with features of mild reflux gastroesophagitis.  There was no dysplasia or malignancy. Gastric bypass with gastrojejunal anastomosis was characterized by ulceration. Pathology revealed oxyntic mucosa with mild oxyntic gland hyperplasia.  There was no malignancy, dysplasia, or H pylori.  Examined jejunum was normal.  EGD on 09/08/2018 revealed salmon-colored mucosa suspicious for short-segment Barrett's esophagus. There were a few gastric polyps. Roux-en-Y gastrojejunostomy with gastrojejunal anastomosis characterized by healthy appearing mucosa and an intact appearance was noted. Normal examined jejunum.  Pathology revealed gastric xanthoma, fundic gland polyp, and oxyntic gland hyperplasia, consistent with proton pump inhibitor effect and negative for H pylori, intestinal metaplasia, dysplasia, and malignancy. There was squamocolumnar mucosa with mild chronic inflammation.   Colonoscopy on 05/11/2018 revealed one 10 mm polyp in the cecum (tubular adenoma), one 3 mm polyp in the transverse colon, one 7 mm polyp in the transverse colon, and one 6 mm polyp in the transverse colon (all fragments of tubular adenomas).  Repeat colonoscopy in 3 years planned.  Dietis good. She  denies any ice pica. She is intolerant of oral ironsecondary to severe constipation.  She has B12 deficiency.  B12 was 168 on 12/29/2012, 367 on 11/29/2015, and 252 on 10/24/2016.  She restarted B12 (last 01/24/2017).  Folate was > 20 on 06/17/2018.  Workup on 03/08/2016 revealed a hematocrit of 30.1, hemoglobin 9.1, MCV 70.7, platelets 331,000, WBC 8200 with an ANC of 5800.  Ferritin was 5.  PT and PTT were normal.  Platelet function assay was normal.  She received Venofer  200 mg IV weekly x 4 (03/15/2016 - 04/05/2016), x 2 (07/23/2016 and 08/05/2016), x 3 (01/29/2017 - 02/12/2017),  x 2 (05/01/2017 - 05/08/2017), x 2 (08/04/2017 - 08/13/2017), and x2 (05/25/2018 - 06/02/2018).  She receives Venofer if her ferritin is < 30.  Ferritin has been followed: 8 on 01/24/2017, 20 on 04/28/2017, 18 on 07/28/2017, 135 on 09/12/2017, 48 on 10/27/2017, 53 on 01/27/2018, 33 on 05/22/2018, 38 on 08/25/2018, 23 on 11/18/2018.   She has a family history of breast cancer. Screening bilateral mammogram on 05/09/2017 revealed no evidence of malignancy.  Symptomatically, she feels "alright".  She denies any bleeding.  She denies any pica.  Exam is stable.  Plan: 1.   Review labs from 11/18/2018. 2.   Labs today:  CBC with diff, BMP. 3.   Iron deficiency anemia               Hematocrit 41.0.  Hemoglobin 13.5.  MCV 91.1.             Ferritin 23 (low).             Venofer today and weekly x2. 4.   B12 deficiency              Patient continues to receive B12 monthly at home (last 2 - 3 weeks ago).             Check folate yearly (last 05/2018). 5.   Hypokalemia  Potassium 3.2.  Patient on potassium supplementation.  Etiology secondary to diuretics (Lasix, HCTZ).  Forward labs to Dr. Einar Pheasant. 6.   RTC in 3 months for labs (CBC with diff, ferritin). 7.   RTC in 6 months for MD assessment, labs (CBC with diff, ferritin, iron studies - day before) and +/- Venofer.  I discussed the assessment and treatment plan with the patient.  The patient was provided an opportunity to ask questions and all were answered.  The patient agreed with the plan and demonstrated an understanding of the instructions.  The patient was advised to call back if the symptoms worsen or if the condition fails to improve as anticipated.   Lequita Asal, MD, PhD    11/25/2018, 4:09 PM

## 2018-11-25 ENCOUNTER — Inpatient Hospital Stay (HOSPITAL_BASED_OUTPATIENT_CLINIC_OR_DEPARTMENT_OTHER): Payer: Managed Care, Other (non HMO) | Admitting: Hematology and Oncology

## 2018-11-25 ENCOUNTER — Inpatient Hospital Stay: Payer: Managed Care, Other (non HMO)

## 2018-11-25 ENCOUNTER — Encounter: Payer: Self-pay | Admitting: Hematology and Oncology

## 2018-11-25 VITALS — BP 109/68 | HR 69 | Temp 97.6°F | Resp 18

## 2018-11-25 VITALS — BP 118/66 | HR 78 | Resp 18 | Ht 67.0 in | Wt 239.8 lb

## 2018-11-25 DIAGNOSIS — E538 Deficiency of other specified B group vitamins: Secondary | ICD-10-CM

## 2018-11-25 DIAGNOSIS — D500 Iron deficiency anemia secondary to blood loss (chronic): Secondary | ICD-10-CM

## 2018-11-25 DIAGNOSIS — D509 Iron deficiency anemia, unspecified: Secondary | ICD-10-CM

## 2018-11-25 DIAGNOSIS — E876 Hypokalemia: Secondary | ICD-10-CM

## 2018-11-25 DIAGNOSIS — K9589 Other complications of other bariatric procedure: Secondary | ICD-10-CM

## 2018-11-25 DIAGNOSIS — D508 Other iron deficiency anemias: Secondary | ICD-10-CM

## 2018-11-25 LAB — CBC WITH DIFFERENTIAL/PLATELET
Abs Immature Granulocytes: 0.04 10*3/uL (ref 0.00–0.07)
Basophils Absolute: 0.1 10*3/uL (ref 0.0–0.1)
Basophils Relative: 1 %
Eosinophils Absolute: 0.1 10*3/uL (ref 0.0–0.5)
Eosinophils Relative: 1 %
HCT: 41 % (ref 36.0–46.0)
Hemoglobin: 13.5 g/dL (ref 12.0–15.0)
Immature Granulocytes: 0 %
Lymphocytes Relative: 20 %
Lymphs Abs: 2 10*3/uL (ref 0.7–4.0)
MCH: 30 pg (ref 26.0–34.0)
MCHC: 32.9 g/dL (ref 30.0–36.0)
MCV: 91.1 fL (ref 80.0–100.0)
Monocytes Absolute: 0.7 10*3/uL (ref 0.1–1.0)
Monocytes Relative: 7 %
Neutro Abs: 7.5 10*3/uL (ref 1.7–7.7)
Neutrophils Relative %: 71 %
Platelets: 270 10*3/uL (ref 150–400)
RBC: 4.5 MIL/uL (ref 3.87–5.11)
RDW: 13.7 % (ref 11.5–15.5)
WBC: 10.4 10*3/uL (ref 4.0–10.5)
nRBC: 0 % (ref 0.0–0.2)

## 2018-11-25 LAB — BASIC METABOLIC PANEL
Anion gap: 10 (ref 5–15)
BUN: 14 mg/dL (ref 6–20)
CO2: 28 mmol/L (ref 22–32)
Calcium: 8.6 mg/dL — ABNORMAL LOW (ref 8.9–10.3)
Chloride: 96 mmol/L — ABNORMAL LOW (ref 98–111)
Creatinine, Ser: 0.61 mg/dL (ref 0.44–1.00)
GFR calc Af Amer: >60 mL/min (ref >60)
GFR calc non Af Amer: >60 mL/min (ref >60)
Glucose, Bld: 98 mg/dL (ref 70–99)
Potassium: 3.2 mmol/L — ABNORMAL LOW (ref 3.5–5.1)
Sodium: 134 mmol/L — ABNORMAL LOW (ref 135–145)

## 2018-11-25 MED ORDER — CYANOCOBALAMIN 1000 MCG/ML IJ SOLN: 1000.0000 ug | Freq: Once | INTRAMUSCULAR | Status: DC

## 2018-11-25 MED ORDER — IRON SUCROSE 20 MG/ML IV SOLN
200.0000 mg | Freq: Once | INTRAVENOUS | Status: AC
  Administered 2018-11-25: 200 mg via INTRAVENOUS

## 2018-11-25 MED ORDER — SODIUM CHLORIDE 0.9 % IV SOLN
Freq: Once | INTRAVENOUS | Status: AC
  Administered 2018-11-25: 14:00:00 via INTRAVENOUS
  Filled 2018-11-25: qty 250

## 2018-11-25 MED ORDER — SODIUM CHLORIDE 0.9 % IV SOLN: 200.0000 mg | Freq: Once | INTRAVENOUS | Status: DC

## 2018-11-25 NOTE — Progress Notes (Signed)
Patient c/o headache

## 2018-11-25 NOTE — Patient Instructions (Signed)

## 2018-11-26 ENCOUNTER — Other Ambulatory Visit: Payer: Self-pay

## 2018-11-26 ENCOUNTER — Telehealth: Payer: Self-pay

## 2018-11-26 MED ORDER — POTASSIUM CHLORIDE CRYS ER 20 MEQ PO TBCR: EXTENDED_RELEASE_TABLET | ORAL | 3 refills | Status: DC

## 2018-11-26 NOTE — Telephone Encounter (Signed)
-----   Message from Lequita Asal, MD sent at 11/25/2018  5:25 PM EDT ----- Regarding: Please call patient and send to PCP.  Please also talk to the PCP's nurse to ensure that they see her low potassium.  M ----- Message ----- From: Buel Ream, Lab In [place] Sent: 11/25/2018   2:00 PM EDT To: Lequita Asal, MD

## 2018-11-26 NOTE — Telephone Encounter (Signed)
Informed patient low low potassium level. Patient states she takes Potassium 40 MEQ in AM and 20 MEQ in PM but has been out for over a week now. Educated patient on Potassium rich foods and informed her I would reach out to her PCP office to make them aware. Patient encouraged to contact PCP office if she does not hear from them regarding Potassium. Patient verbalizes understanding.   Contacted Dr. Bary Leriche office and spoke with nurse, Larena Glassman, regarding low Potassium and request for refill. Larena Glassman states request was never received but that she would send it in now. Labs forwarded to PCP office.

## 2018-11-30 ENCOUNTER — Encounter (INDEPENDENT_AMBULATORY_CARE_PROVIDER_SITE_OTHER): Payer: Self-pay | Admitting: Family Medicine

## 2018-11-30 ENCOUNTER — Other Ambulatory Visit: Payer: Self-pay

## 2018-11-30 ENCOUNTER — Ambulatory Visit (INDEPENDENT_AMBULATORY_CARE_PROVIDER_SITE_OTHER): Payer: Managed Care, Other (non HMO) | Admitting: Family Medicine

## 2018-11-30 VITALS — BP 119/75 | HR 71 | Temp 97.9°F | Ht 67.0 in | Wt 233.0 lb

## 2018-11-30 DIAGNOSIS — R7303 Prediabetes: Secondary | ICD-10-CM | POA: Diagnosis not present

## 2018-11-30 DIAGNOSIS — Z6836 Body mass index (BMI) 36.0-36.9, adult: Secondary | ICD-10-CM

## 2018-11-30 DIAGNOSIS — I10 Essential (primary) hypertension: Secondary | ICD-10-CM | POA: Diagnosis not present

## 2018-11-30 NOTE — Progress Notes (Signed)
Office: [phone]  /  Fax: [phone]   HPI:   Chief Complaint: OBESITY Lori Le is here to discuss her progress with her obesity treatment plan. She is on the keep a food journal with 1150-1300 calories and 80+ grams of protein daily and is following her eating plan approximately 60 % of the time. She states she is exercising 0 minutes 0 times per week. Lori Le was just diagnosed with psoriatic arthritis and has follow up plans with her Rheumatologist on September 8th. She is a candidate for 2 anti-psoriasis medications. She just finished her second round of prednisone (had increase in hunger, and hot flashes). She voices she has been cooking a lot for other people. She wants to start cooking for herself. She is having rotator cuff surgery on 12/22/2018.  Her weight is 233 lb (105.7 kg) today and has gained 3 lbs since her last visit. She has lost 0 lbs since starting treatment with [name].  Pre-Diabetes Lori Le has a diagnosis of pre-diabetes based on her elevated Hgb A1c and was informed this puts her at greater risk of developing diabetes. Last Hgb A1c was of 5.9. She denies GI side effects of metformin and notes carbohydrate cravings with prednisone. She continues to work on diet and exercise to decrease risk of diabetes. She denies hypoglycemia.  Hypertension Lori Le is a 53 y.o. female with hypertension. Lori Le's blood pressure is controlled today. She denies chest pain, chest pressure, or headaches. She is working on weight loss to help control her blood pressure with the goal of decreasing her risk of heart attack and stroke.   ASSESSMENT AND PLAN:  Prediabetes  Essential hypertension  Class 2 severe obesity with serious comorbidity and body mass index (BMI) of 36.0 to 36.9 in adult, unspecified obesity type [facility])  PLAN:  Pre-Diabetes Lori Le will continue to work on weight loss, exercise, and decreasing simple carbohydrates in her diet to help decrease the risk of diabetes. We dicussed  metformin including benefits and risks. She was informed that eating too many simple carbohydrates or too many calories at one sitting increases the likelihood of GI side effects. Lori Le agrees to continue taking metformin, and she agrees to follow up with our clinic in 2 weeks as directed to monitor her progress.  Hypertension We discussed sodium restriction, working on healthy weight loss, and a regular exercise program as the means to achieve improved blood pressure control. Lori Le agreed with this plan and agreed to follow up as directed. We will continue to monitor her blood pressure as well as her progress with the above lifestyle modifications. Lori Le agrees to continue taking Maxzide and will watch for signs of hypotension as she continues her lifestyle modifications. Lori Le agrees to follow up with our clinic in 2 weeks.  I spent > than 50% of the 15 minute visit on counseling as documented in the note.  Obesity Lori Le is currently in the action stage of change. As such, her goal is to continue with weight loss efforts She has agreed to keep a food journal with 1150-1300 calories and 80+ grams of protein daily Lori Le is to cook and plan food for herself for the next week. Lori Le has been instructed to work up to a goal of 150 minutes of combined cardio and strengthening exercise per week for weight loss and overall health benefits. We discussed the following Behavioral Modification Strategies today: increasing lean protein intake, increasing vegetables and work on meal planning and easy cooking plans, keeping healthy foods in the  home, and planning for success   Lori Le has agreed to follow up with our clinic in 2 weeks. She was informed of the importance of frequent follow up visits to maximize her success with intensive lifestyle modifications for her multiple health conditions.  ALLERGIES: Allergies  Allergen Reactions  . Contrast Media [Iodinated Diagnostic Agents] Anaphylaxis    MRI dye  .  Etodolac Anaphylaxis  . Alpha-Gal     Flu- like symptoms  . Influenza Vaccines Hives  . Lactose Nausea And Vomiting    Can have yogurt Can not have milk and ice cream   . Lactose Intolerance (Gi) Nausea And Vomiting    Can have yogurt Can not have milk and ice cream    . Bacitracin-Neomycin-Polymyxin Rash  . Neomycin-Bacitracin Zn-Polymyx Rash  . Neosporin + Pain Relief Max St [Neomy-Bacit-Polymyx-Pramoxine] Rash    MEDICATIONS: Current Outpatient Medications on File Prior to Visit  Medication Sig Dispense Refill  . clobetasol ointment (TEMOVATE) [id number] % Apply 1 application topically at bedtime.     . cyanocobalamin (,VITAMIN B-12,) 1000 MCG/ML injection ADM 1 ML IM Q 30 DAYS    . furosemide (LASIX) 40 MG tablet Take 1 tablet (40 mg total) by mouth daily. 90 tablet 1  . gabapentin (NEURONTIN) 300 MG capsule 2 (two) times daily.    [name] levothyroxine (SYNTHROID) 137 MCG tablet Take 137 mcg by mouth daily before breakfast.     . metFORMIN (GLUCOPHAGE) 500 MG tablet Take 1 tablet (500 mg total) by mouth daily with breakfast. 90 tablet 0  . metolazone (ZAROXOLYN) 5 MG tablet Take 5 mg by mouth daily as needed.     . montelukast (SINGULAIR) 10 MG tablet Take 10 mg by mouth at bedtime.    . polyethylene glycol powder (GLYCOLAX/MIRALAX) 17 GM/SCOOP powder Take 17 g by mouth daily as needed for moderate constipation. 255 g 0  . potassium chloride SA (K-DUR) 20 MEQ tablet Take two tablets (40 meq) by mouth daily in the morning and 1 tablet (20 meq) by mouth daily in the evening 90 tablet 3  . sertraline (ZOLOFT) 100 MG tablet Take 1 tablet (100 mg total) by mouth daily. 90 tablet 0  . triamcinolone (KENALOG) 0.025 % cream APP EXT AA BID    . triamterene-hydrochlorothiazide (MAXZIDE-25) 37.5-25 MG tablet Take 1 tablet by mouth daily. 90 tablet 1  . allopurinol (ZYLOPRIM) 300 MG tablet TAKE 1 TABLET BY MOUTH EVERY DAY (Patient not taking: Reported on 11/30/2018) 90 tablet 2  . pantoprazole  (PROTONIX) 40 MG tablet Take 1 tablet (40 mg total) by mouth daily. 90 tablet 1   Current Facility-Administered Medications on File Prior to Visit  Medication Dose Route Frequency Provider Last Rate Last Dose  . 0.9 %  sodium chloride infusion   Intravenous Continuous Lequita Asal, MD 0 mL/hr at 05/08/17 1437    . cyanocobalamin ((VITAMIN B-12)) injection 1,000 mcg  1,000 mcg Intramuscular Once Lequita Asal, MD        PAST MEDICAL HISTORY: Past Medical History:  Diagnosis Date  . Allergic rhinitis due to allergen   . Allergy   . Anemia   . Anxiety   . Arthritis   . B12 deficiency   . Cancer ([place])    skin ca  . Chronic idiopathic urticaria   . Complication of anesthesia    nausea, slow to wake up  . Depression   . Edema   . Family history of adverse reaction to anesthesia  Father - PONV  . Gastric ulcer   . GERD (gastroesophageal reflux disease)   . Gout   . Gout   . Hashimoto's thyroiditis   . Heart murmur   . History of eating disorder   . Hives   . Hyperlipidemia   . Hypertension   . Hypothyroidism   . Lower extremity edema   . Melanoma in situ ([place])    left shoulder  . Migraine    approx 4x/yr  . Mitral valve disorder   . Motion sickness    all moving vehicles  . Neuropathy    fingers and feet  . PCOS (polycystic ovarian syndrome)   . PONV (postoperative nausea and vomiting)   . Thyroid disease   . Ulcer   . Vertigo    last episode over 1 yr ago    PAST SURGICAL HISTORY: Past Surgical History:  Procedure Laterality Date  . BONE EXCISION Left 09/03/2018   Procedure: PART EXCISION BONE-PHALANX 2,3,4 LEFT;  Surgeon: Albertine Patricia, DPM;  Location: [place];  Service: Podiatry;  Laterality: Left;  . BREAST BIOPSY Right 2007   neg- bx/clip  . CHOLECYSTECTOMY  2003  . COLONOSCOPY WITH PROPOFOL N/A 05/11/2018   Procedure: COLONOSCOPY WITH PROPOFOL;  Surgeon: [name], MD;  Location: [facility];  Service: Endoscopy;   Laterality: N/A;  . DILITATION & CURRETTAGE/HYSTROSCOPY WITH NOVASURE ABLATION N/A 08/19/2016   Procedure: DILATATION & CURETTAGE/HYSTEROSCOPY WITH NOVASURE ABLATION;  Surgeon: [name], MD;  Location: [facility];  Service: Gynecology;  Laterality: N/A;  . ESOPHAGOGASTRODUODENOSCOPY (EGD) WITH PROPOFOL N/A 12/05/2015   Procedure: ESOPHAGOGASTRODUODENOSCOPY (EGD) WITH PROPOFOL;  Surgeon: Lollie Sails, MD;  Location: [facility];  Service: Endoscopy;  Laterality: N/A;  . ESOPHAGOGASTRODUODENOSCOPY (EGD) WITH PROPOFOL N/A 02/06/2016   Procedure: ESOPHAGOGASTRODUODENOSCOPY (EGD) WITH PROPOFOL;  Surgeon: Lollie Sails, MD;  Location: [facility] END[facility]COPY;  Service: Endoscopy;  Laterality: N/A;  . ESOPHAGOGASTRODUODENOSCOPY (EGD) WITH PROPOFOL N/A 05/11/2018   Procedure: ESOPHAGOGASTRODUODENOSCOPY (EGD) WITH PROPOFOL;  Surgeon: [name], MD;  Location: [facility];  Service: Endoscopy;  Laterality: N/A;  . ESOPHAGOGASTRODUODENOSCOPY (EGD) WITH PROPOFOL N/A 09/08/2018   Procedure: ESOPHAGOGASTRODUODENOSCOPY (EGD) WITH BIOPSY;  Surgeon: [name], MD;  Location: [place];  Service: Endoscopy;  Laterality: N/A;  . FOOT ARTHRODESIS Left 09/03/2018   Procedure: ARTHRODESIS; HALLUX/IP JOINT LEFT;  Surgeon: Albertine Patricia, DPM;  Location: [place];  Service: Podiatry;  Laterality: Left;  LMA LOCAL  . FOOT SURGERY  1994  . GASTRIC BYPASS  2011  . JOINT REPLACEMENT Left 09/27/2014   HIP  . JOINT REPLACEMENT Left 12/01/2014   KNEE  . KNEE ARTHROSCOPY Right   . KNEE ARTHROSCOPY Left 03/23/2015   Procedure: ARTHROSCOPY KNEE, partial synovectomy;  Surgeon: Hessie Knows, MD;  Location: [facility];  Service: Orthopedics;  Laterality: Left;  . LAPAROSCOPIC GASTRIC BANDING  2010  . POLYPECTOMY N/A 09/08/2018   Procedure: POLYPECTOMY;  Surgeon: [name], MD;  Location: [place];  Service: Endoscopy;  Laterality: N/A;  Stomach  . ROTATOR CUFF  REPAIR Bilateral    right shoulder 07-26-2016  . SHOULDER ARTHROSCOPY Left 06/20/2015   Procedure: ARTHROSCOPY SHOULDER, REPAIR OF MASSIVE ROTATOR CUFF TEAR, TENODESIS, DECOMPRESSION DEBRIDEMENT;  Surgeon: Corky Mull, MD;  Location: [facility];  Service: Orthopedics;  Laterality: Left;  . TOTAL HIP ARTHROPLASTY Left 09/27/2014   Procedure: TOTAL HIP ARTHROPLASTY ANTERIOR APPROACH;  Surgeon: Hessie Knows, MD;  Location: [facility];  Service: Orthopedics;  Laterality: Left;  .  TOTAL KNEE ARTHROPLASTY Left 12/01/2014   Procedure: TOTAL KNEE ARTHROPLASTY;  Surgeon: Hessie Knows, MD;  Location: [facility];  Service: Orthopedics;  Laterality: Left;    SOCIAL HISTORY: Social History   Tobacco Use  . Smoking status: Never Smoker  . Smokeless tobacco: Never Used  Substance Use Topics  . Alcohol use: Yes    Alcohol/week: 0.0 - 1.0 standard drinks    Comment: occ.  . Drug use: No    FAMILY HISTORY: Family History  Problem Relation Age of Onset  . Arthritis Mother   . Hyperlipidemia Mother   . Hypertension Mother   . Diabetes Mother   . Sleep apnea Mother   . Obesity Mother   . Arthritis Father   . Hyperlipidemia Father   . Hypertension Father   . Mental illness Father   . Diabetes Father   . Obesity Father   . Sleep apnea Father   . Bipolar disorder Father   . Anxiety disorder Father   . Depression Father   . Cancer Father   . Kidney disease Father   . Arthritis Maternal Grandmother   . Cancer Maternal Grandmother        breast cancer  . Hyperlipidemia Maternal Grandmother   . Hypertension Maternal Grandmother   . Breast cancer Maternal Grandmother   . Arthritis Maternal Grandfather   . Hyperlipidemia Maternal Grandfather   . Hypertension Maternal Grandfather   . Heart disease Maternal Grandfather        heart attack  . Breast cancer Paternal Grandmother     ROS: Review of Systems  Constitutional: Negative for weight loss.  Cardiovascular: Negative for chest pain.        Negative chest pressure  Neurological: Negative for headaches.  Endo/Heme/Allergies:       Negative hypoglycemia    PHYSICAL EXAM: Blood pressure 119/75, pulse 71, temperature 97.9 F (36.6 C), temperature source Oral, height 5\' 7"  (1.702 m), weight 233 lb (105.7 kg), SpO2 97 %. Body mass index is 36.49 kg/m. Physical Exam Vitals signs reviewed.  Constitutional:      Appearance: Normal appearance. She is obese.  Cardiovascular:     Rate and Rhythm: Normal rate.     Pulses: Normal pulses.  Pulmonary:     Effort: Pulmonary effort is normal.     Breath sounds: Normal breath sounds.  Musculoskeletal: Normal range of motion.  Skin:    General: Skin is warm and dry.  Neurological:     Mental Status: She is alert and oriented to person, place, and time.  Psychiatric:        Mood and Affect: Mood normal.        Behavior: Behavior normal.     RECENT LABS AND TESTS: BMET    Component Value Date/Time   NA 134 (L) 11/25/2018 1348   NA 139 09/01/2018 1527   K 3.2 (L) 11/25/2018 1348   K 4.0 09/27/2014   CL 96 (L) 11/25/2018 1348   CO2 28 11/25/2018 1348   GLUCOSE 98 11/25/2018 1348   BUN 14 11/25/2018 1348   BUN 16 09/01/2018 1527   CREATININE 0.61 11/25/2018 1348   CREATININE 0.81 09/12/2017 1529   CALCIUM 8.6 (L) 11/25/2018 1348   GFRNONAA >60 11/25/2018 1348   GFRAA >60 11/25/2018 1348   Lab Results  Component Value Date   HGBA1C 5.9 (H) 10/27/2018   HGBA1C 5.8 05/26/2018   HGBA1C 5.5 04/10/2017   HGBA1C 5.4 04/07/2015   Lab Results  Component Value Date  INSULIN 13.4 10/27/2018   INSULIN 12.2 06/17/2018   CBC    Component Value Date/Time   WBC 10.4 11/25/2018 1348   RBC 4.50 11/25/2018 1348   HGB 13.5 11/25/2018 1348   HGB 14.2 07/09/2018 1459   HCT 41.0 11/25/2018 1348   HCT 40.4 07/09/2018 1459   PLT 270 11/25/2018 1348   PLT 316 07/09/2018 1459   MCV 91.1 11/25/2018 1348   MCV 88 07/09/2018 1459   MCH 30.0 11/25/2018 1348   MCHC 32.9 11/25/2018  1348   RDW 13.7 11/25/2018 1348   RDW 13.0 07/09/2018 1459   LYMPHSABS 2.0 11/25/2018 1348   LYMPHSABS 2.6 07/09/2018 1459   MONOABS 0.7 11/25/2018 1348   EOSABS 0.1 11/25/2018 1348   EOSABS 0.1 07/09/2018 1459   BASOSABS 0.1 11/25/2018 1348   BASOSABS 0.1 07/09/2018 1459   Iron/TIBC/Ferritin/ %Sat    Component Value Date/Time   IRON 31 05/22/2018 1540   TIBC 373 05/22/2018 1540   FERRITIN 23 11/18/2018 1342   FERRITIN 42 04/07/2015 0938   IRONPCTSAT 8 (L) 05/22/2018 1540   Lipid Panel     Component Value Date/Time   CHOL 178 05/26/2018 0842   CHOL 202 (H) 04/10/2017 0821   TRIG 256.0 (H) 05/26/2018 0842   HDL 62.60 05/26/2018 0842   HDL 67 04/10/2017 0821   CHOLHDL 3 05/26/2018 0842   VLDL 51.2 (H) 05/26/2018 0842   LDLCALC 82 04/10/2017 0821   LDLDIRECT 106.0 05/26/2018 0842   Hepatic Function Panel     Component Value Date/Time   PROT 6.9 11/18/2018 1342   PROT 6.7 04/10/2017 0821   ALBUMIN 3.8 11/18/2018 1342   ALBUMIN 4.2 04/10/2017 0821   AST 12 (L) 11/18/2018 1342   ALT 15 11/18/2018 1342   ALKPHOS 48 11/18/2018 1342   BILITOT 0.4 11/18/2018 1342   BILITOT 0.3 04/10/2017 0821   BILIDIR 0.1 09/12/2017 1529   BILIDIR 0.09 04/10/2017 0821   IBILI 0.2 09/12/2017 1529      Component Value Date/Time   TSH 0.57 05/26/2018 0842   TSH 0.54 12/26/2016 0805   TSH 1.40 02/28/2015 1406      OBESITY BEHAVIORAL INTERVENTION VISIT  Today's visit was # 12   Starting weight: 222 lbs Starting date: 06/17/2018 Today's weight : 233 lbs Today's date: 11/30/2018 Total lbs lost to date: 0    ASK: We discussed the diagnosis of obesity with Lori Le today and Lori Le agreed to give [name] permission to discuss obesity behavioral modification therapy today.  ASSESS: Lori Le has the diagnosis of obesity and her BMI today is 36.48 Lori Le is in the action stage of change   ADVISE: Lori Le was educated on the multiple health risks of obesity as well as the benefit of weight  loss to improve her health. She was advised of the need for long term treatment and the importance of lifestyle modifications to improve her current health and to decrease her risk of future health problems.  AGREE: Multiple dietary modification options and treatment options were discussed and  Lori Le agreed to follow the recommendations documented in the above note.  ARRANGE: Lori Le was educated on the importance of frequent visits to treat obesity as outlined per CMS and USPSTF guidelines and agreed to schedule her next follow up appointment today.  I, Trixie Dredge, am acting as transcriptionist for Ilene Qua, MD  I have reviewed the above documentation for accuracy and completeness, and I agree with the above. - Ilene Qua, MD

## 2018-12-02 ENCOUNTER — Other Ambulatory Visit: Payer: Self-pay

## 2018-12-02 ENCOUNTER — Inpatient Hospital Stay: Payer: Managed Care, Other (non HMO) | Attending: Hematology and Oncology

## 2018-12-02 VITALS — BP 150/74 | HR 114 | Temp 97.0°F | Resp 20

## 2018-12-02 DIAGNOSIS — D509 Iron deficiency anemia, unspecified: Secondary | ICD-10-CM | POA: Diagnosis not present

## 2018-12-02 DIAGNOSIS — D500 Iron deficiency anemia secondary to blood loss (chronic): Secondary | ICD-10-CM

## 2018-12-02 MED ORDER — SODIUM CHLORIDE 0.9 % IV SOLN
Freq: Once | INTRAVENOUS | Status: AC
  Administered 2018-12-02: 14:00:00 via INTRAVENOUS
  Filled 2018-12-02: qty 250

## 2018-12-02 MED ORDER — SODIUM CHLORIDE 0.9 % IV SOLN: 200.0000 mg | Freq: Once | INTRAVENOUS | Status: DC

## 2018-12-02 MED ORDER — IRON SUCROSE 20 MG/ML IV SOLN
200.0000 mg | Freq: Once | INTRAVENOUS | Status: AC
  Administered 2018-12-02: 200 mg via INTRAVENOUS
  Filled 2018-12-02: qty 10

## 2018-12-02 NOTE — Patient Instructions (Signed)

## 2018-12-08 ENCOUNTER — Ambulatory Visit: Payer: Managed Care, Other (non HMO) | Admitting: Gastroenterology

## 2018-12-08 ENCOUNTER — Telehealth: Payer: Self-pay

## 2018-12-08 ENCOUNTER — Other Ambulatory Visit: Payer: Self-pay | Admitting: *Deleted

## 2018-12-08 MED ORDER — CYANOCOBALAMIN 1000 MCG/ML IJ SOLN: INTRAMUSCULAR | 5 refills | Status: DC

## 2018-12-08 NOTE — Telephone Encounter (Signed)
Refill B-12 1000 mcg/ ml injection Q 30 days # 1 ml with 5 refills. The patient has been made aware.

## 2018-12-09 ENCOUNTER — Inpatient Hospital Stay: Payer: Managed Care, Other (non HMO)

## 2018-12-11 ENCOUNTER — Inpatient Hospital Stay: Payer: Managed Care, Other (non HMO)

## 2018-12-14 ENCOUNTER — Ambulatory Visit (INDEPENDENT_AMBULATORY_CARE_PROVIDER_SITE_OTHER): Payer: Managed Care, Other (non HMO) | Admitting: Family Medicine

## 2018-12-14 ENCOUNTER — Other Ambulatory Visit: Payer: Self-pay

## 2018-12-14 VITALS — BP 115/73 | HR 74 | Temp 98.0°F | Resp 20 | Ht 67.0 in | Wt 233.0 lb

## 2018-12-14 DIAGNOSIS — I10 Essential (primary) hypertension: Secondary | ICD-10-CM

## 2018-12-14 DIAGNOSIS — R7303 Prediabetes: Secondary | ICD-10-CM | POA: Diagnosis not present

## 2018-12-14 DIAGNOSIS — Z6836 Body mass index (BMI) 36.0-36.9, adult: Secondary | ICD-10-CM

## 2018-12-14 NOTE — Progress Notes (Signed)
Office: [phone]  /  Fax: [phone]   HPI:   Chief Complaint: OBESITY Aide is here to discuss her progress with her obesity treatment plan. She is on the keep a food journal with 1150-1300 calories and 80+ grams of protein daily and is following her eating plan approximately 80 % of the time. She states she is exercising 0 minutes 0 times per week. Lori Le is still limited in terms of activity, secondary to psoriatic arthritis. She is planning to start Consentx after staple removal from upcoming shoulder surgery. She was at the beach during the last 2 weeks and voices she was only eating 2 meals a day. She is having the plan to cook prior to surgery. She is really enjoying journaling.  Her weight is 233 lb (105.7 kg) today and has not lost weight since her last visit. She has lost 0 lbs since starting treatment with [name].  Hypertension Lori Le is a 53 y.o. female with hypertension. [name]'s blood pressure is controlled today. She denies chest pain, chest pressure, or headaches. She is working on weight loss to help control her blood pressure with the goal of decreasing her risk of heart attack and stroke.   Pre-Diabetes Lori Le has a diagnosis of pre-diabetes based on her elevated Hgb A1c and was informed this puts her at greater risk of developing diabetes. Last Hgb A1c was of 5.9 and insulin of 13.4. She is taking metformin currently and denies GI side effects. She continues to work on diet and exercise to decrease risk of diabetes. She denies hypoglycemia.  ASSESSMENT AND PLAN:  Essential hypertension  Prediabetes  Class 2 severe obesity with serious comorbidity and body mass index (BMI) of 36.0 to 36.9 in adult, unspecified obesity type ([place])  PLAN:  Hypertension We discussed sodium restriction, working on healthy weight loss, and a regular exercise program as the means to achieve improved blood pressure control. Devona agreed with this plan and agreed to follow up as directed. We  will continue to monitor her blood pressure as well as her progress with the above lifestyle modifications. Tynleigh agrees to continue her current medications, no refill needed. She will watch for signs of hypotension as she continues her lifestyle modifications. Kerrah agrees to follow up with our clinic in 2 weeks.  Pre-Diabetes Lori Le will continue to work on weight loss, exercise, and decreasing simple carbohydrates in her diet to help decrease the risk of diabetes. We dicussed metformin including benefits and risks. She was informed that eating too many simple carbohydrates or too many calories at one sitting increases the likelihood of GI side effects. Umaima agrees to continue taking metformin, no refill needed. Teyanna agrees to follow up with our clinic in 2 weeks as directed to monitor her progress.  I spent > than 50% of the 15 minute visit on counseling as documented in the note.  Obesity Lori Le is currently in the action stage of change. As such, her goal is to continue with weight loss efforts She has agreed to keep a food journal with 1150-1300 calories and 80+ grams of protein daily Lori Le has been instructed to work up to a goal of 150 minutes of combined cardio and strengthening exercise per week for weight loss and overall health benefits. We discussed the following Behavioral Modification Strategies today: increasing lean protein intake, increasing vegetables and work on meal planning and easy cooking plans, keeping healthy foods in the home, and planning for success   Lori Le has agreed to follow up  with our clinic in 2 weeks. She was informed of the importance of frequent follow up visits to maximize her success with intensive lifestyle modifications for her multiple health conditions.  ALLERGIES: Allergies  Allergen Reactions  . Contrast Media [Iodinated Diagnostic Agents] Anaphylaxis    MRI dye  . Etodolac Anaphylaxis  . Alpha-Gal     Flu- like symptoms  . Influenza Vaccines Hives  .  Lactose Nausea And Vomiting    Can have yogurt Can not have milk and ice cream   . Lactose Intolerance (Gi) Nausea And Vomiting    Can have yogurt Can not have milk and ice cream    . Bacitracin-Neomycin-Polymyxin Rash  . Neomycin-Bacitracin Zn-Polymyx Rash  . Neosporin + Pain Relief Max St [Neomy-Bacit-Polymyx-Pramoxine] Rash    MEDICATIONS: Current Outpatient Medications on File Prior to Visit  Medication Sig Dispense Refill  . allopurinol (ZYLOPRIM) 300 MG tablet TAKE 1 TABLET BY MOUTH EVERY DAY 90 tablet 2  . clobetasol ointment (TEMOVATE) [id number] % Apply 1 application topically at bedtime as needed (inflammation).     . cyanocobalamin (,VITAMIN B-12,) 1000 MCG/ML injection ADM 1 ML IM Q 30 DAYS 1 mL 5  . furosemide (LASIX) 40 MG tablet Take 1 tablet (40 mg total) by mouth daily. 90 tablet 1  . gabapentin (NEURONTIN) 300 MG capsule 300 mg 2 (two) times daily.     [name] levothyroxine (SYNTHROID) 137 MCG tablet Take 137 mcg by mouth daily before breakfast.     . metFORMIN (GLUCOPHAGE) 500 MG tablet Take 1 tablet (500 mg total) by mouth daily with breakfast. 90 tablet 0  . montelukast (SINGULAIR) 10 MG tablet Take 10 mg by mouth at bedtime.    . pantoprazole (PROTONIX) 40 MG tablet Take 40 mg by mouth daily.    . potassium chloride SA (K-DUR) 20 MEQ tablet Take two tablets (40 meq) by mouth daily in the morning and 1 tablet (20 meq) by mouth daily in the evening 90 tablet 3  . sertraline (ZOLOFT) 100 MG tablet Take 1 tablet (100 mg total) by mouth daily. 90 tablet 0  . triamcinolone (KENALOG) 0.025 % cream Apply 1 application topically 2 (two) times daily as needed (irritation).     . triamterene-hydrochlorothiazide (MAXZIDE-25) 37.5-25 MG tablet Take 1 tablet by mouth daily. 90 tablet 1   Current Facility-Administered Medications on File Prior to Visit  Medication Dose Route Frequency Provider Last Rate Last Dose  . 0.9 %  sodium chloride infusion   Intravenous Continuous Lequita Asal, MD 0 mL/hr at 05/08/17 1437    . cyanocobalamin ((VITAMIN B-12)) injection 1,000 mcg  1,000 mcg Intramuscular Once Lequita Asal, MD        PAST MEDICAL HISTORY: Past Medical History:  Diagnosis Date  . Allergic rhinitis due to allergen   . Allergy   . Anemia   . Anxiety   . Arthritis   . B12 deficiency   . Cancer ([place])    skin ca  . Chronic idiopathic urticaria   . Complication of anesthesia    nausea, slow to wake up  . Depression   . Edema   . Family history of adverse reaction to anesthesia    Father - PONV  . Gastric ulcer   . GERD (gastroesophageal reflux disease)   . Gout   . Gout   . Hashimoto's thyroiditis   . Heart murmur   . History of eating disorder   . Hives   . Hyperlipidemia   .  Hypertension   . Hypothyroidism   . Lower extremity edema   . Melanoma in situ ([place])    left shoulder  . Migraine    approx 4x/yr  . Mitral valve disorder   . Motion sickness    all moving vehicles  . Neuropathy    fingers and feet  . PCOS (polycystic ovarian syndrome)   . PONV (postoperative nausea and vomiting)   . Thyroid disease   . Ulcer   . Vertigo    last episode over 1 yr ago    PAST SURGICAL HISTORY: Past Surgical History:  Procedure Laterality Date  . BONE EXCISION Left 09/03/2018   Procedure: PART EXCISION BONE-PHALANX 2,3,4 LEFT;  Surgeon: Albertine Patricia, DPM;  Location: [place];  Service: Podiatry;  Laterality: Left;  . BREAST BIOPSY Right 2007   neg- bx/clip  . CHOLECYSTECTOMY  2003  . COLONOSCOPY WITH PROPOFOL N/A 05/11/2018   Procedure: COLONOSCOPY WITH PROPOFOL;  Surgeon: [name], MD;  Location: [facility];  Service: Endoscopy;  Laterality: N/A;  . DILITATION & CURRETTAGE/HYSTROSCOPY WITH NOVASURE ABLATION N/A 08/19/2016   Procedure: DILATATION & CURETTAGE/HYSTEROSCOPY WITH NOVASURE ABLATION;  Surgeon: [name], MD;  Location: [facility];  Service: Gynecology;  Laterality: N/A;  . ESOPHAGOGASTRODUODENOSCOPY  (EGD) WITH PROPOFOL N/A 12/05/2015   Procedure: ESOPHAGOGASTRODUODENOSCOPY (EGD) WITH PROPOFOL;  Surgeon: Lollie Sails, MD;  Location: [facility] END[facility]COPY;  Service: Endoscopy;  Laterality: N/A;  . ESOPHAGOGASTRODUODENOSCOPY (EGD) WITH PROPOFOL N/A 02/06/2016   Procedure: ESOPHAGOGASTRODUODENOSCOPY (EGD) WITH PROPOFOL;  Surgeon: Lollie Sails, MD;  Location[facility] END[facility]COPY;  Service: Endoscopy;  Laterality: N/A;  . ESOPHAGOGASTRODUODENOSCOPY (EGD) WITH PROPOFOL N/A 05/11/2018   Procedure: ESOPHAGOGASTRODUODENOSCOPY (EGD) WITH PROPOFOL;  Surgeon: [name], MD;  Location: [facility];  Service: Endoscopy;  Laterality: N/A;  . ESOPHAGOGASTRODUODENOSCOPY (EGD) WITH PROPOFOL N/A 09/08/2018   Procedure: ESOPHAGOGASTRODUODENOSCOPY (EGD) WITH BIOPSY;  Surgeon: [name], MD;  Location: [place];  Service: Endoscopy;  Laterality: N/A;  . FOOT ARTHRODESIS Left 09/03/2018   Procedure: ARTHRODESIS; HALLUX/IP JOINT LEFT;  Surgeon: Albertine Patricia, DPM;  Location: [place];  Service: Podiatry;  Laterality: Left;  LMA LOCAL  . FOOT SURGERY  1994  . GASTRIC BYPASS  2011  . JOINT REPLACEMENT Left 09/27/2014   HIP  . JOINT REPLACEMENT Left 12/01/2014   KNEE  . KNEE ARTHROSCOPY Right   . KNEE ARTHROSCOPY Left 03/23/2015   Procedure: ARTHROSCOPY KNEE, partial synovectomy;  Surgeon: Hessie Knows, MD;  Location: [facility];  Service: Orthopedics;  Laterality: Left;  . LAPAROSCOPIC GASTRIC BANDING  2010  . POLYPECTOMY N/A 09/08/2018   Procedure: POLYPECTOMY;  Surgeon: [name], MD;  Location: [place];  Service: Endoscopy;  Laterality: N/A;  Stomach  . ROTATOR CUFF REPAIR Bilateral    right shoulder 07-26-2016  . SHOULDER ARTHROSCOPY Left 06/20/2015   Procedure: ARTHROSCOPY SHOULDER, REPAIR OF MASSIVE ROTATOR CUFF TEAR, TENODESIS, DECOMPRESSION DEBRIDEMENT;  Surgeon: Corky Mull, MD;  Location: [facility];  Service: Orthopedics;  Laterality: Left;  .  TOTAL HIP ARTHROPLASTY Left 09/27/2014   Procedure: TOTAL HIP ARTHROPLASTY ANTERIOR APPROACH;  Surgeon: Hessie Knows, MD;  Location: [facility];  Service: Orthopedics;  Laterality: Left;  . TOTAL KNEE ARTHROPLASTY Left 12/01/2014   Procedure: TOTAL KNEE ARTHROPLASTY;  Surgeon: Hessie Knows, MD;  Location: [facility];  Service: Orthopedics;  Laterality: Left;    SOCIAL HISTORY: Social History   Tobacco Use  . Smoking status: Never Smoker  . Smokeless tobacco: Never Used  Substance Use Topics  . Alcohol use: Yes    Alcohol/week: 0.0 - 1.0 standard drinks    Comment: occ.  . Drug use: No    FAMILY HISTORY: Family History  Problem Relation Age of Onset  . Arthritis Mother   . Hyperlipidemia Mother   . Hypertension Mother   . Diabetes Mother   . Sleep apnea Mother   . Obesity Mother   . Arthritis Father   . Hyperlipidemia Father   . Hypertension Father   . Mental illness Father   . Diabetes Father   . Obesity Father   . Sleep apnea Father   . Bipolar disorder Father   . Anxiety disorder Father   . Depression Father   . Cancer Father   . Kidney disease Father   . Arthritis Maternal Grandmother   . Cancer Maternal Grandmother        breast cancer  . Hyperlipidemia Maternal Grandmother   . Hypertension Maternal Grandmother   . Breast cancer Maternal Grandmother   . Arthritis Maternal Grandfather   . Hyperlipidemia Maternal Grandfather   . Hypertension Maternal Grandfather   . Heart disease Maternal Grandfather        heart attack  . Breast cancer Paternal Grandmother     ROS: Review of Systems  Constitutional: Negative for weight loss.  Cardiovascular: Negative for chest pain.       Negative chest pressure  Neurological: Negative for headaches.  Endo/Heme/Allergies:       Negative hypoglycemia    PHYSICAL EXAM: Blood pressure 115/73, pulse 74, temperature 98 F (36.7 C), temperature source Oral, resp. rate 20, height 5\' 7"  (1.702 m), weight 233 lb (105.7 kg), SpO2  96 %. Body mass index is 36.49 kg/m. Physical Exam Vitals signs reviewed.  Constitutional:      Appearance: Normal appearance. She is obese.  Cardiovascular:     Rate and Rhythm: Normal rate.     Pulses: Normal pulses.  Pulmonary:     Effort: Pulmonary effort is normal.     Breath sounds: Normal breath sounds.  Musculoskeletal: Normal range of motion.  Skin:    General: Skin is warm and dry.  Neurological:     Mental Status: She is alert and oriented to person, place, and time.  Psychiatric:        Mood and Affect: Mood normal.        Behavior: Behavior normal.     RECENT LABS AND TESTS: BMET    Component Value Date/Time   NA 134 (L) 11/25/2018 1348   NA 139 09/01/2018 1527   K 3.2 (L) 11/25/2018 1348   K 4.0 09/27/2014   CL 96 (L) 11/25/2018 1348   CO2 28 11/25/2018 1348   GLUCOSE 98 11/25/2018 1348   BUN 14 11/25/2018 1348   BUN 16 09/01/2018 1527   CREATININE 0.61 11/25/2018 1348   CREATININE 0.81 09/12/2017 1529   CALCIUM 8.6 (L) 11/25/2018 1348   GFRNONAA >60 11/25/2018 1348   GFRAA >60 11/25/2018 1348   Lab Results  Component Value Date   HGBA1C 5.9 (H) 10/27/2018   HGBA1C 5.8 05/26/2018   HGBA1C 5.5 04/10/2017   HGBA1C 5.4 04/07/2015   Lab Results  Component Value Date   INSULIN 13.4 10/27/2018   INSULIN 12.2 06/17/2018   CBC    Component Value Date/Time   WBC 10.4 11/25/2018 1348   RBC 4.50 11/25/2018 1348   HGB 13.5 11/25/2018 1348   HGB 14.2 07/09/2018 1459   HCT 41.0  11/25/2018 1348   HCT 40.4 07/09/2018 1459   PLT 270 11/25/2018 1348   PLT 316 07/09/2018 1459   MCV 91.1 11/25/2018 1348   MCV 88 07/09/2018 1459   MCH 30.0 11/25/2018 1348   MCHC 32.9 11/25/2018 1348   RDW 13.7 11/25/2018 1348   RDW 13.0 07/09/2018 1459   LYMPHSABS 2.0 11/25/2018 1348   LYMPHSABS 2.6 07/09/2018 1459   MONOABS 0.7 11/25/2018 1348   EOSABS 0.1 11/25/2018 1348   EOSABS 0.1 07/09/2018 1459   BASOSABS 0.1 11/25/2018 1348   BASOSABS 0.1 07/09/2018 1459    Iron/TIBC/Ferritin/ %Sat    Component Value Date/Time   IRON 31 05/22/2018 1540   TIBC 373 05/22/2018 1540   FERRITIN 23 11/18/2018 1342   FERRITIN 42 04/07/2015 0938   IRONPCTSAT 8 (L) 05/22/2018 1540   Lipid Panel     Component Value Date/Time   CHOL 178 05/26/2018 0842   CHOL 202 (H) 04/10/2017 0821   TRIG 256.0 (H) 05/26/2018 0842   HDL 62.60 05/26/2018 0842   HDL 67 04/10/2017 0821   CHOLHDL 3 05/26/2018 0842   VLDL 51.2 (H) 05/26/2018 0842   LDLCALC 82 04/10/2017 0821   LDLDIRECT 106.0 05/26/2018 0842   Hepatic Function Panel     Component Value Date/Time   PROT 6.9 11/18/2018 1342   PROT 6.7 04/10/2017 0821   ALBUMIN 3.8 11/18/2018 1342   ALBUMIN 4.2 04/10/2017 0821   AST 12 (L) 11/18/2018 1342   ALT 15 11/18/2018 1342   ALKPHOS 48 11/18/2018 1342   BILITOT 0.4 11/18/2018 1342   BILITOT 0.3 04/10/2017 0821   BILIDIR 0.1 09/12/2017 1529   BILIDIR 0.09 04/10/2017 0821   IBILI 0.2 09/12/2017 1529      Component Value Date/Time   TSH 0.57 05/26/2018 0842   TSH 0.54 12/26/2016 0805   TSH 1.40 02/28/2015 1406      OBESITY BEHAVIORAL INTERVENTION VISIT  Today's visit was # 13   Starting weight: 222 lbs Starting date: 06/17/2018 Today's weight : 233 lbs Today's date: 12/14/2018 Total lbs lost to date: 0    ASK: We discussed the diagnosis of obesity with [name] today and [name] agreed to give [name] permission to discuss obesity behavioral modification therapy today.  ASSESS: Jaliayah has the diagnosis of obesity and her BMI today is 36.69 Jyzelle is in the action stage of change   ADVISE: Baraah was educated on the multiple health risks of obesity as well as the benefit of weight loss to improve her health. She was advised of the need for long term treatment and the importance of lifestyle modifications to improve her current health and to decrease her risk of future health problems.  AGREE: Multiple dietary modification options and treatment options were  discussed and  Taji agreed to follow the recommendations documented in the above note.  ARRANGE: Caedyn was educated on the importance of frequent visits to treat obesity as outlined per CMS and USPSTF guidelines and agreed to schedule her next follow up appointment today.  I, Trixie Dredge, am acting as transcriptionist for Ilene Qua, MD  I have reviewed the above documentation for accuracy and completeness, and I agree with the above. - Ilene Qua, MD

## 2018-12-15 ENCOUNTER — Encounter
Admission: RE | Admit: 2018-12-15 | Discharge: 2018-12-15 | Disposition: A | Discharge status: Home / Self Care | Payer: Managed Care, Other (non HMO) | Source: Ambulatory Visit | Attending: Surgery | Admitting: Surgery

## 2018-12-15 DIAGNOSIS — Z01812 Encounter for preprocedural laboratory examination: Secondary | ICD-10-CM | POA: Insufficient documentation

## 2018-12-15 HISTORY — DX: Personal history of urinary calculi: Z87.442

## 2018-12-15 HISTORY — DX: Arthropathic psoriasis, unspecified: L40.50

## 2018-12-15 LAB — POTASSIUM: Potassium: 3.8 mmol/L (ref 3.5–5.1)

## 2018-12-15 NOTE — Pre-Procedure Instructions (Signed)
Minna Merritts, MD  Physician  Specialty:  Cardiology  Progress Notes  Signed  Encounter Date:  06/04/2018          Signed      Expand All Collapse All    Show:Clear all [x] Manual[x] Template[] Copied  Added by: [x] Minna Merritts, MD[x] Arvin Collard, Jesus  [] Hover for details Cardiology Office Note  Date:  06/04/2018   ID:  [name], DOB: Jun 24, 1965, MRN: [id number]  PCP:  Einar Pheasant, MD           Chief Complaint  Patient presents with  . New Patient (Initial Visit)    bilateral lower extremity edema SOB with exertion.Medications reviewed verbally with the patient.     HPI:  Ms. Shepperd is 53 y.o. female with a history of: Hot flashes Chronic venous insufficiency Mitral valve disease Essential hypertension, benign Hypercholesterolemia Migraines Hx of colon polyps Benign neoplasm of cecum Benign neoplasm of transverse colon Columnar epithelial-lined lower esophagus Gastrointestinal ulcer  Lymphedema Bilateral lower extremity edema Hyperglycemia Rotator cuff syndrome Arthritis of knee, degenerative Degenerative arthritis of hip B12 deficiency Primary osteoarthritis of knee Anemia Edema Excessive and frequent menstruation Derangement of posterior horn of medial meniscus  Melanoma in situ (HCC) Mild depression (HCC) GERD Polycystic ovarian syndrome (PCOS) Hypothyroidism Who presents in the office by referral from Dr. Einar Pheasant for consultation of her  bilateral lower extremity edema and shortness of breath on exertion.  INTERVAL HISTORY: The patient reports today for an initial visit and was referred by Dr. Einar Pheasant, MD for bilateral lower extremity edema and shortness of breath on exertion. She reports having long history of leg swelling, does not like to wear compression hose Previously seen by vein and vascular, has lymphedema compression pumps, uses these sporadically.  As her swelling is well controlled at this time Has been  on Lasix 40 daily with HCTZ 25 for a long time, Recently noted hypokalemia and started on supplement potassium 20 twice daily  Her cholesterol is good and is not on any medication. She eats a lot of chicken and sashimi (raw fish), and avoids red meat. Has cut back on carbohydrates.  Trying to lose weight  She has four ulcers since divorce. CBC is reviewed, no recent anemia  She has low energy due to B12 deficiency, but a B12 shot resolves it.  Shortness of breath when "B12 and other vitamins are low" No regular exercise program  She takes levothyroxine which keeps her TSH stable.   Father passed from cancer, but no cardiac disease.   Her skin stays dry and lips crack even though she drinks a lot of water. The lasix and HCTZ may be dehydrating her and depleting her potassium levels.   She stays active and is a non-smoker.  Today's Blood pressure 126/76 Total Chol 178/ LDL 106 HBA1C 5.8 CR 0.82 Glucose 101  EKG personally reviewed by myself on todays visit Shows normal sinus rhythm. 76 bpm.    OTHER PAST MEDICAL HISTORY REVIEWED BY ME FOR TODAY'S VISIT:   PMH:   has a past medical history of Allergic rhinitis due to allergen, Allergy, Anemia, Arthritis, Cancer ([place]), Chronic idiopathic urticaria, Complication of anesthesia, Depression, GERD (gastroesophageal reflux disease), Gout, Gout, Hashimoto's thyroiditis, Heart murmur, History of eating disorder, Hives, Hyperlipidemia, Hypertension, Hypothyroidism, Lower extremity edema, Melanoma in situ ([place]), Migraine, Mitral valve disorder, Motion sickness, PCOS (polycystic ovarian syndrome), PONV (postoperative nausea and vomiting), Thyroid disease, Ulcer, and Vertigo.  PSH:         Past  Surgical History:  Procedure Laterality Date  . BREAST BIOPSY Right 2007   neg- bx/clip  . CHOLECYSTECTOMY  2003  . COLONOSCOPY WITH PROPOFOL N/A 05/11/2018   Procedure: COLONOSCOPY WITH PROPOFOL;  Surgeon: [name], MD;   Location: [facility] ENDOSCOPY;  Service: Endoscopy;  Laterality: N/A;  . DILITATION & CURRETTAGE/HYSTROSCOPY WITH NOVASURE ABLATION N/A 08/19/2016   Procedure: DILATATION & CURETTAGE/HYSTEROSCOPY WITH NOVASURE ABLATION;  Surgeon: [name], MD;  Location: [facility] ORS;  Service: Gynecology;  Laterality: N/A;  . ESOPHAGOGASTRODUODENOSCOPY (EGD) WITH PROPOFOL N/A 12/05/2015   Procedure: ESOPHAGOGASTRODUODENOSCOPY (EGD) WITH PROPOFOL;  Surgeon: Lollie Sails, MD;  Location: [facility] ENDOSCOPY;  Service: Endoscopy;  Laterality: N/A;  . ESOPHAGOGASTRODUODENOSCOPY (EGD) WITH PROPOFOL N/A 02/06/2016   Procedure: ESOPHAGOGASTRODUODENOSCOPY (EGD) WITH PROPOFOL;  Surgeon: Lollie Sails, MD;  Location: [facility] ENDOSCOPY;  Service: Endoscopy;  Laterality: N/A;  . ESOPHAGOGASTRODUODENOSCOPY (EGD) WITH PROPOFOL N/A 05/11/2018   Procedure: ESOPHAGOGASTRODUODENOSCOPY (EGD) WITH PROPOFOL;  Surgeon: [name], MD;  Location: [facility] ENDOSCOPY;  Service: Endoscopy;  Laterality: N/A;  . FOOT SURGERY  1994  . GASTRIC BYPASS  2011  . JOINT REPLACEMENT Left 09/27/2014   HIP  . JOINT REPLACEMENT Left 12/01/2014   KNEE  . KNEE ARTHROSCOPY Right   . KNEE ARTHROSCOPY Left 03/23/2015   Procedure: ARTHROSCOPY KNEE, partial synovectomy;  Surgeon: Hessie Knows, MD;  Location: [facility] ORS;  Service: Orthopedics;  Laterality: Left;  . LAPAROSCOPIC GASTRIC BANDING  2010  . ROTATOR CUFF REPAIR Bilateral    right shoulder 07-26-2016  . SHOULDER ARTHROSCOPY Left 06/20/2015   Procedure: ARTHROSCOPY SHOULDER, REPAIR OF MASSIVE ROTATOR CUFF TEAR, TENODESIS, DECOMPRESSION DEBRIDEMENT;  Surgeon: Corky Mull, MD;  Location: [facility] ORS;  Service: Orthopedics;  Laterality: Left;  . TOTAL HIP ARTHROPLASTY Left 09/27/2014   Procedure: TOTAL HIP ARTHROPLASTY ANTERIOR APPROACH;  Surgeon: Hessie Knows, MD;  Location: [facility] ORS;  Service: Orthopedics;  Laterality: Left;  . TOTAL KNEE ARTHROPLASTY Left 12/01/2014   Procedure: TOTAL  KNEE ARTHROPLASTY;  Surgeon: Hessie Knows, MD;  Location: [facility] ORS;  Service: Orthopedics;  Laterality: Left;          Current Outpatient Medications  Medication Sig Dispense Refill  . allopurinol (ZYLOPRIM) 300 MG tablet TAKE 1 TABLET BY MOUTH EVERY DAY 90 tablet 2  . colchicine 0.6 MG tablet Take 1 tablet (0.6 mg total) by mouth 2 (two) times daily. 40 tablet 0  . furosemide (LASIX) 40 MG tablet TAKE 1 TABLET BY MOUTH DAILY 90 tablet 0  . gabapentin (NEURONTIN) 100 MG capsule Take 200 mg by mouth 2 (two) times daily.    [name] levothyroxine (SYNTHROID, LEVOTHROID) 137 MCG tablet TAKE ONE TABLET BY MOUTH ONCE DAILY BEFORE BREAKFAST 90 tablet 0  . mupirocin ointment (BACTROBAN) 2 % Place 1 application into the nose 2 (two) times daily. 22 g 0  . pantoprazole (PROTONIX) 40 MG tablet Take 1 tablet (40 mg total) by mouth 2 (two) times daily for 30 days. 60 tablet 1  . potassium chloride (K-DUR,KLOR-CON) 20 MEQ tablet Take 1 tablet (20 mEq total) by mouth 2 (two) times daily. 180 tablet 1  . sertraline (ZOLOFT) 50 MG tablet Take 1 tablet (50 mg total) by mouth daily. 90 tablet 0  . sucralfate (CARAFATE) 1 GM/10ML suspension Take 10 mLs (1 g total) by mouth 4 (four) times daily for 30 days. 1200 mL 1  . SYRINGE-NEEDLE, DISP, 3 ML (B-D INTEGRA SYRINGE) 23G X 1" 3 ML MISC USE TO ADMINISTER PARENTERAL B12 SUPPLEMENTATION 6 each  PRN  . triamterene-hydrochlorothiazide (MAXZIDE-25) 37.5-25 MG tablet TAKE 1 TABLET BY MOUTH EVERY DAY 90 tablet 0  . acetaminophen (TYLENOL) 650 MG CR tablet Take 1,300 mg by mouth 2 (two) times daily.    [name] azelastine (ASTELIN) 0.1 % nasal spray U 1 TO 2 SPRAYS IEN BID  6  . cyanocobalamin (,VITAMIN B-12,) 1000 MCG/ML injection INJECT 1 ML INTO THE MUSCLE EVERY 30 DAYS (Patient not taking: Reported on 06/04/2018) 1 mL 5  . DYMISTA 137-50 MCG/ACT SUSP SPRAY 1 SPRAY INTO EACH NOSTRIL BID  2   No current facility-administered medications for this visit.              Facility-Administered Medications Ordered in Other Visits  Medication Dose Route Frequency Provider Last Rate Last Dose  . 0.9 %  sodium chloride infusion   Intravenous Continuous Lequita Asal, MD 0 mL/hr at 05/08/17 1437    . cyanocobalamin ((VITAMIN B-12)) injection 1,000 mcg  1,000 mcg Intramuscular Once Lequita Asal, MD         ALLERGIES:   Contrast media [iodinated diagnostic agents]; Etodolac; Influenza vaccines; Lactose; Lactose intolerance (gi); Bacitracin-neomycin-polymyxin; Neomycin-bacitracin zn-polymyx; and Neosporin + pain relief max st [neomy-bacit-polymyx-pramoxine]   SOCIAL HISTORY:  The patient  reports that she has never smoked. She has never used smokeless tobacco. She reports current alcohol use. She reports that she does not use drugs.   FAMILY HISTORY:   family history includes Arthritis in her father, maternal grandfather, maternal grandmother, and mother; Breast cancer in her maternal grandmother and paternal grandmother; Cancer in her maternal grandmother; Diabetes in her father and mother; Heart disease in her maternal grandfather; Hyperlipidemia in her father, maternal grandfather, maternal grandmother, and mother; Hypertension in her father, maternal grandfather, maternal grandmother, and mother; Mental illness in her father.    REVIEW OF SYSTEMS: Review of Systems  Constitutional: Negative.   Eyes: Negative.   Respiratory: Negative.  Negative for shortness of breath.   Cardiovascular: Positive for leg swelling.  Gastrointestinal: Negative.   Genitourinary: Negative.   Musculoskeletal: Negative.   Neurological: Negative.   Psychiatric/Behavioral: Negative.   All other systems reviewed and are negative.    PHYSICAL EXAM: VS:  BP 126/76 (BP Location: Right Arm, Patient Position: Sitting, Cuff Size: Normal)   Pulse 76   Ht 5' 7.5" (1.715 m)   Wt 229 lb (103.9 kg)   LMP  (LMP Unknown)   BMI 35.34 kg/m  , BMI Body mass index is 35.34  kg/m.  GEN: Well nourished, well developed, in no acute distress HEENT: normal Neck: no JVD, carotid bruits, or masses Cardiac: RRR; no murmurs, rubs, or gallops, trace nonpitting edema bilateral lower extremities  Respiratory:  clear to auscultation bilaterally, normal work of breathing  GI: soft, nontender, nondistended, + BS MS: no deformity or atrophy Skin: warm and dry, no rash Neuro:  Strength and sensation are intact Psych: euthymic mood, full affect   RECENT LABS: 05/22/2018: Hemoglobin 14.5; Platelets 364 05/26/2018: ALT 23; BUN 19; Creatinine, Ser 0.82; Potassium 2.9; Sodium 136; TSH 0.57    LIPID PANEL: Recent Labs       Lab Results  Component Value Date   CHOL 178 05/26/2018   HDL 62.60 05/26/2018   LDLCALC 82 04/10/2017   TRIG 256.0 (H) 05/26/2018        WEIGHT:    Wt Readings from Last 3 Encounters:  06/04/18 229 lb (103.9 kg)  05/26/18 223 lb 14.4 oz (101.6 kg)  05/25/18 225 lb  8.5 oz (102.3 kg)       ASSESSMENT AND PLAN:  Shortness of breath Plan: Recommend scheduling an echocardiogram  Bilateral leg edema Plan: Recommend scheduling an echocardiogram She is on high-dose diuretics Lasix 40 daily with HCTZ 25 daily now on potassium 20 twice daily Lab work concerning for mild prerenal state She is looking to wean down on the diuretics Suggested she decrease Lasix down to 20 daily and stay on HCTZ If no significant change in her leg swelling potentially could take Lasix every other day with HCTZ daily -She does have high water intake, unable to exclude component of diastolic dysfunction, possibly exacerbated by weight  Hyperlipidemia Numbers discussed in detail, recommended dietary changes for elevated triglycerides We did discuss screening studies such as CT coronary calcium scoring  Essential hypertension Blood pressure relatively well controlled, no changes to her medications  GERD Symptoms well controlled On PPI   Disposition:   F/U  PRN We will call her with her echocardiogram results  Total encounter time more than23minutes. Greater than 50% was spent in counseling and coordination of care with the patient.  Patient was seen in consultation for Dr. Nicki Reaper and will be referred back to her office for ongoing care of the issues detailed above      Orders Placed This Encounter  Procedures  . EKG 12-Lead  . ECHOCARDIOGRAM COMPLETE   I, Jesus Reyes am acting as a scribe for Ida Rogue, M.D., Ph.D.  I, Ida Rogue, M.D. Ph.D., have reviewed the above documentation for accuracy and completeness, and I agree with the above.   Signed, Esmond Plants, M.D., Ph.D. 06/04/2018  [place]             Electronically signed by Minna Merritts, MD at 06/04/2018 5:18 PM   Office Visit on 06/04/2018     Revision History     Detailed Report

## 2018-12-15 NOTE — Patient Instructions (Signed)
Your procedure is scheduled on: 12-22-18 TUESDAY Report to Same Day Surgery 2nd floor medical mall [facility] Entrance-take elevator on left to 2nd floor.  Check in with surgery information desk.) To find out your arrival time please call [phone] between 1PM - 3PM on 12-21-18 MNDAY  Remember: Instructions that are not followed completely may result in serious medical risk, up to and including death, or upon the discretion of your surgeon and anesthesiologist your surgery may need to be rescheduled.    _x___ 1. Do not eat food after midnight the night before your procedure. NO GUM OR CANDY AFTER MIDNIGHT. You may drink clear liquids up to 2 hours before you are scheduled to arrive at the hospital for your procedure.  Do not drink clear liquids within 2 hours of your scheduled arrival to the hospital.  Clear liquids include  --Water or Apple juice without pulp  --Clear carbohydrate beverage such as ClearFast or Gatorade  --Black Coffee or Clear Tea (No milk, no creamers, do not add anything to the coffee or Tea   ____Ensure clear carbohydrate drink on the way to the hospital for bariatric patients  ____Ensure clear carbohydrate drink 3 hours before surgery.     __x__ 2. No Alcohol for 24 hours before or after surgery.   __x__3. No Smoking or e-cigarettes for 24 prior to surgery.  Do not use any chewable tobacco products for at least 6 hour prior to surgery   ____  4. Bring all medications with you on the day of surgery if instructed.    __x__ 5. Notify your doctor if there is any change in your medical condition     (cold, fever, infections).    x___6. On the morning of surgery brush your teeth with toothpaste and water.  You may rinse your mouth with mouth wash if you wish.  Do not swallow any toothpaste or mouthwash.   Do not wear jewelry, make-up, hairpins, clips or nail polish.  Do not wear lotions, powders, or perfumes. You may wear deodorant.  Do not shave 48 hours prior  to surgery. Men may shave face and neck.  Do not bring valuables to the hospital.    [facility] is not responsible for any belongings or valuables.               Contacts, dentures or bridgework may not be worn into surgery.  Leave your suitcase in the car. After surgery it may be brought to your room.  For patients admitted to the hospital, discharge time is determined by your treatment team.  _  Patients discharged the day of surgery will not be allowed to drive home.  You will need someone to drive you home and stay with you the night of your procedure.    Please read over the following fact sheets that you were given:   [facility] Preparing for Surgery   _x___ TAKE THE FOLLOWING MEDICATION THE MORNING OF SURGERY WITH A SMALL SIP OF WATER. These include:  1. GABAPENTIN (NEURONTIN)  2. LEVOTHYROXINE (SYNTHROID)  3. PROTONIX (PANTOPRAZOLE)  4. ZOLOFT (SERTRALINE)  5. TAKE AN EXTRA PROTONIX THE NIGHT BEFORE YOUR SURGERY  6.  ____Fleets enema or Magnesium Citrate as directed.   _x___ Use CHG Soap or sage wipes as directed on instruction sheet   ____ Use inhalers on the day of surgery and bring to hospital day of surgery  _X___ Stop Metformin 2 days prior to surgery-LAST DOSE ON Saturday 12-19-18   ____  Take 1/2 of usual insulin dose the night before surgery and none on the morning surgery.   ____ Follow recommendations from Cardiologist, Pulmonologist or PCP regarding stopping Aspirin, Coumadin, Plavix ,Eliquis, Effient, or Pradaxa, and Pletal.  X____Stop Anti-inflammatories such as Advil, Aleve, Ibuprofen, Motrin, Naproxen, Naprosyn, Goodies powders or aspirin products NOW-OK to take Tylenol    ____ Stop supplements until after surgery.    ____ Bring C-Pap to the hospital.

## 2018-12-15 NOTE — Pre-Procedure Instructions (Signed)
Result Notes for ECHOCARDIOGRAM COMPLETE  Notes recorded by Lori Laundry, RN on 07/24/2018 at 9:32 AM EDT  Patient made aware of echo results with verbalized understanding. She 07/24/18 tel enc regarding Potassium info.  ------   Notes recorded by Lori Filbert, RN on 07/16/2018 at 3:59 PM EDT  Dr. Rockey Le- what should her potassium dose be?  ------   Notes recorded by Lori Merritts, MD on 07/15/2018 at 10:03 PM EDT  Echo  Normal study  No significant fluid retention  No valve disease  Would consider decreasing the lasix down to every other day  Stay on HCTZ      Study Result  Result status: Final result     ECHOCARDIOGRAM REPORT       Patient Name:   Lori Le Date of Exam: 07/02/2018 Medical Rec #:  [id number]    Height:       67.0 in Accession #:    [id number]   Weight:       222.0 lb Date of Birth:  [birth year]   BSA:          2.11 m Patient Age:    53 years     BP:           126/70 mmHg Patient Gender: F            HR:           86 bpm. Exam Location:  [facility]    Procedure: 2D Echo, Cardiac Doppler and Color Doppler  Indications:    R06.02 SOB   History:        Patient has no prior history of Echocardiogram examinations.                 Mitral Valve Disease Signs/Symptoms: Dyspnea and Edema Risk                 Factors: Hypertension, Dyslipidemia and Non-Smoker. GERD.   Sonographer:    Lori Le RDMS, RVT, RDCS Referring Phys: Lori Le Comments: Suboptimal parasternal window. IMPRESSIONS    1. The left ventricle has normal systolic function, with an ejection fraction of 55-60%. The cavity size was normal. Left ventricular diastolic Doppler parameters are consistent with impaired relaxation.Challenging images, unable to exclude mild  hypokinesis of the anteroseptal and septal wall in select images.  2. The right ventricle has normal systolic function. The cavity was normal. There is no increase in right  ventricular wall thickness. Normal RVSP  3. The aortic valve was not well visualized. Valve opens well. Aortic valve regurgitation is mild by color flow Doppler.Mild thickening of the leaflets  FINDINGS  Left Ventricle: The left ventricle has normal systolic function, with an ejection fraction of 55-60%. The cavity size was normal. There is no increase in left ventricular wall thickness. Left ventricular diastolic Doppler parameters are consistent with  impaired relaxation. Right Ventricle: The right ventricle has normal systolic function. The cavity was normal. There is no increase in right ventricular wall thickness. Left Atrium: left atrial size was normal in size Right Atrium: right atrial size was normal in size. Right atrial pressure is estimated at 10 mmHg. Interatrial Septum: No atrial level shunt detected by color flow Doppler. Pericardium: There is no evidence of pericardial effusion. Mitral Valve: The mitral valve is normal in structure. Mitral valve regurgitation is mild by color flow Doppler. Tricuspid Valve: The tricuspid valve is grossly normal. Tricuspid valve regurgitation is trivial by color  flow Doppler. Aortic Valve: The aortic valve was not well visualized Mild thickening of the aortic valve. Aortic valve regurgitation is mild by color flow Doppler. Pulmonic Valve: The pulmonic valve was grossly normal. Pulmonic valve regurgitation was not assessed by color flow Doppler. No pulmonic valve vegetation visualized. Venous: The inferior vena cava is normal in size with greater than 50% respiratory variability.   LEFT VENTRICLE PLAX 2D LVIDd:         4.10 cm       Diastology LVIDs:         2.80 cm       LV e' lateral:   9.79 cm/s LV PW:         1.00 cm       LV E/e' lateral: 5.4 LV IVS:        1.30 cm       LV e' medial:    8.05 cm/s LVOT diam:     2.20 cm       LV E/e' medial:  6.5 LV SV:         45 ml LV SV Index:   20.11 LVOT Area:     3.80 cm   LV Volumes (MOD) LV  area Le, A2C:    32.20 cm LV area Le, A4C:    30.10 cm LV area s, A2C:    18.90 cm LV area s, A4C:    19.10 cm LV major Le, A2C:   8.16 cm LV major Le, A4C:   7.62 cm LV major s, A2C:   7.04 cm LV major s, A4C:   6.86 cm LV vol Le, MOD A2C: 107.0 ml LV vol Le, MOD A4C: 98.0 ml LV vol s, MOD A2C: 43.4 ml LV vol s, MOD A4C: 45.5 ml LV SV MOD A2C:     63.6 ml LV SV MOD A4C:     98.0 ml LV SV MOD BP:      60.8 ml  RIGHT VENTRICLE RV S prime:     11.40 cm/s TAPSE (M-mode): 1.8 cm  LEFT ATRIUM             Index       RIGHT ATRIUM           Index LA diam:        3.30 cm 1.56 cm/m  RA Pressure: 10 mmHg LA Vol (A2C):   33.8 ml 15.99 ml/m RA Area:     11.10 cm LA Vol (A4C):   30.3 ml 14.33 ml/m RA Volume:   21.00 ml  9.93 ml/m LA Biplane Vol: 33.0 ml 15.61 ml/m  AORTIC VALVE AV Area (Vmax):    3.13 cm AV Area (Vmean):   2.96 cm AV Area (VTI):     3.80 cm AV Vmax:           121.00 cm/s AV Vmean:          84.900 cm/s AV VTI:            0.177 m AV Peak Grad:      5.9 mmHg AV Mean Grad:      3.0 mmHg LVOT Vmax:         99.70 cm/s LVOT Vmean:        66.200 cm/s LVOT VTI:          0.177 m LVOT/AV VTI ratio: 1.00   AORTA Ao Root diam: 2.90 cm Ao Arch diam: 2.6 cm  MITRAL VALVE MV Area (PHT): 2.99 cm  SHUNTS MV PHT:        73.66 msec Systemic VTI:  0.18 m MV Decel Time: 254 msec   Systemic Diam: 2.20 cm MV E velocity: 52.60 cm/s MV A velocity: 70.40 cm/s MV E/A ratio:  0.75    Lori Rogue MD Electronically signed by Lori Rogue MD Signature Date/Time: 07/02/2018/5:49:52 PM       Final    Syngo Images  Show images for ECHOCARDIOGRAM COMPLETE  Images on [employer]  Show images for [facility]  Performing Technologist/Nurse  Performing Technologist/Nurse: [facility]  Reason for Exam Priority: Routine Dx: Shortness of breath [R06.02 (ICD-10-CM)]  Comments:                        Surgical History  Surgical History  No  past medical history on file.    Other Surgical History  Procedure Laterality Date Comment Source  BONE EXCISION Left 09/03/2018 Procedure: PART EXCISION BONE-PHALANX 2,3,4 LEFT; Surgeon: [facility], DPM; Location: [facility]; Service: Podiatry; Laterality: Left; Provider  BREAST BIOPSY Right 2007 neg- bx/clip Provider  CHOLECYSTECTOMY  2003  Provider  COLONOSCOPY WITH PROPOFOL N/A 05/11/2018 Procedure: COLONOSCOPY WITH PROPOFOL; Surgeon: [name], MD; Location: [facility]; Service: Endoscopy; Laterality: N/A; Provider  DILITATION & CURRETTAGE/HYSTROSCOPY WITH NOVASURE ABLATION N/A 08/19/2016 Procedure: DILATATION & CURETTAGE/HYSTEROSCOPY WITH NOVASURE ABLATION; Surgeon: [name], MD; Location: [facility]; Service: Gynecology; Laterality: N/A; Provider  ESOPHAGOGASTRODUODENOSCOPY (EGD) WITH PROPOFOL N/A 12/05/2015 Procedure: ESOPHAGOGASTRODUODENOSCOPY (EGD) WITH PROPOFOL; Surgeon: Lollie Sails, MD; Location: [facility]; Service: Endoscopy; Laterality: N/A; Provider  ESOPHAGOGASTRODUODENOSCOPY (EGD) WITH PROPOFOL N/A 02/06/2016 Procedure: ESOPHAGOGASTRODUODENOSCOPY (EGD) WITH PROPOFOL; Surgeon: Lollie Sails, MD; Location: [facility]; Service: Endoscopy; Laterality: N/A; Provider  ESOPHAGOGASTRODUODENOSCOPY (EGD) WITH PROPOFOL N/A 05/11/2018 Procedure: ESOPHAGOGASTRODUODENOSCOPY (EGD) WITH PROPOFOL; Surgeon: [name], MD; Location: [facility]; Service: Endoscopy; Laterality: N/A; Provider  ESOPHAGOGASTRODUODENOSCOPY (EGD) WITH PROPOFOL N/A 09/08/2018 Procedure: ESOPHAGOGASTRODUODENOSCOPY (EGD) WITH BIOPSY; Surgeon: [name], MD; Location: [place]; Service: Endoscopy; Laterality: N/A; Provider  FOOT ARTHRODESIS Left 09/03/2018 Procedure: ARTHRODESIS; HALLUX/IP JOINT LEFT; Surgeon: [facility] Le, DPM; Location: [place]; Service: Podiatry; Laterality: Left; LMA LOCAL Provider   FOOT SURGERY  1994  Provider  GASTRIC BYPASS  2011  Provider  JOINT REPLACEMENT Left 09/27/2014 HIP Provider  JOINT REPLACEMENT Left 12/01/2014 KNEE Provider  KNEE ARTHROSCOPY Right   Provider  KNEE ARTHROSCOPY Left 03/23/2015 Procedure: ARTHROSCOPY KNEE, partial synovectomy; Surgeon: Hessie Knows, MD; Location: [facility]; Service: Orthopedics; Laterality: Left; Provider  LAPAROSCOPIC GASTRIC BANDING  2010  Provider  POLYPECTOMY N/A 09/08/2018 Procedure: POLYPECTOMY; Surgeon: [name], MD; Location: [place]; Service: Endoscopy; Laterality: N/A; Stomach Provider  ROTATOR CUFF REPAIR Bilateral  right shoulder 07-26-2016 Provider  SHOULDER ARTHROSCOPY Left 06/20/2015 Procedure: ARTHROSCOPY SHOULDER, REPAIR OF MASSIVE ROTATOR CUFF TEAR, TENODESIS, DECOMPRESSION DEBRIDEMENT; Surgeon: Corky Mull, MD; Location: [facility]; Service: Orthopedics; Laterality: Left; Provider  TOTAL HIP ARTHROPLASTY Left 09/27/2014 Procedure: TOTAL HIP ARTHROPLASTY ANTERIOR APPROACH; Surgeon: Hessie Knows, MD; Location: [facility]; Service: Orthopedics; Laterality: Left; Provider  TOTAL KNEE ARTHROPLASTY Left 12/01/2014 Procedure: TOTAL KNEE ARTHROPLASTY; Surgeon: Hessie Knows, MD; Location: [facility]; Service: Orthopedics; Laterality: Left; Provider    Implants   Implant  Capt Hip Total 3 - [id number] - Capitated Charge Entry (Left) Hip Inventory item: CAPT HIP TOTAL 3 Model/Cat number: [id number]  Manufacturer: MEDACTA    As of 09/27/2014  Status: Capitated Charge Entry      Capt Knee Total  3 - [id number] - Capitated Charge Entry (Left) Knee Inventory item: CAPT KNEE TOTAL 3 Model/Cat number: [id number]  Manufacturer: MEDACTA    As of 12/01/2014  Status: Capitated Charge Entry      Order-Level Documents:  There are no order-level documents. Encounter-Level Documents - 07/02/2018:  Electronic signature on 07/02/2018 3:06 PM - E-signed     Resulted by:  Signed Date/Time   Phone Pager  [facility] 07/02/2018 5:49 PM [phone]   External Result Report  External Result Report

## 2018-12-16 ENCOUNTER — Inpatient Hospital Stay: Payer: Managed Care, Other (non HMO)

## 2018-12-16 ENCOUNTER — Other Ambulatory Visit: Payer: Self-pay

## 2018-12-16 VITALS — BP 136/82 | HR 74 | Temp 97.6°F | Resp 18

## 2018-12-16 DIAGNOSIS — D500 Iron deficiency anemia secondary to blood loss (chronic): Secondary | ICD-10-CM

## 2018-12-16 DIAGNOSIS — D509 Iron deficiency anemia, unspecified: Secondary | ICD-10-CM | POA: Diagnosis not present

## 2018-12-16 MED ORDER — SODIUM CHLORIDE 0.9 % IV SOLN
Freq: Once | INTRAVENOUS | Status: AC
  Administered 2018-12-16: 11:00:00 via INTRAVENOUS
  Filled 2018-12-16: qty 250

## 2018-12-16 MED ORDER — IRON SUCROSE 20 MG/ML IV SOLN
200.0000 mg | Freq: Once | INTRAVENOUS | Status: AC
  Administered 2018-12-16: 11:00:00 200 mg via INTRAVENOUS
  Filled 2018-12-16: qty 10

## 2018-12-16 MED ORDER — SODIUM CHLORIDE 0.9 % IV SOLN: 200.0000 mg | Freq: Once | INTRAVENOUS | Status: DC

## 2018-12-16 NOTE — Patient Instructions (Signed)

## 2018-12-18 ENCOUNTER — Other Ambulatory Visit: Payer: Self-pay

## 2018-12-18 ENCOUNTER — Other Ambulatory Visit
Admission: RE | Admit: 2018-12-18 | Discharge: 2018-12-18 | Disposition: A | Discharge status: Home / Self Care | Payer: Managed Care, Other (non HMO) | Source: Ambulatory Visit | Attending: Surgery | Admitting: Surgery

## 2018-12-18 DIAGNOSIS — Z01812 Encounter for preprocedural laboratory examination: Secondary | ICD-10-CM | POA: Diagnosis not present

## 2018-12-18 DIAGNOSIS — Z20828 Contact with and (suspected) exposure to other viral communicable diseases: Secondary | ICD-10-CM | POA: Diagnosis not present

## 2018-12-18 LAB — SARS CORONAVIRUS 2 (TAT 6-24 HRS): SARS Coronavirus 2: NEGATIVE

## 2018-12-21 MED ORDER — CEFAZOLIN SODIUM-DEXTROSE 2-4 GM/100ML-% IV SOLN
2.0000 g | Freq: Once | INTRAVENOUS | Status: AC
  Administered 2018-12-22: 08:00:00 2 g via INTRAVENOUS

## 2018-12-22 ENCOUNTER — Encounter
Admission: RE | Disposition: A | Discharge status: Home / Self Care | Payer: Self-pay | Source: Home / Self Care | Attending: Surgery

## 2018-12-22 ENCOUNTER — Ambulatory Visit
Admission: RE | Admit: 2018-12-22 | Discharge: 2018-12-22 | Disposition: A | Discharge status: Home / Self Care | Payer: Managed Care, Other (non HMO) | Attending: Surgery | Admitting: Surgery

## 2018-12-22 ENCOUNTER — Ambulatory Visit: Payer: Managed Care, Other (non HMO) | Admitting: Anesthesiology

## 2018-12-22 ENCOUNTER — Encounter: Payer: Self-pay | Admitting: *Deleted

## 2018-12-22 ENCOUNTER — Ambulatory Visit: Payer: Managed Care, Other (non HMO)

## 2018-12-22 DIAGNOSIS — M1990 Unspecified osteoarthritis, unspecified site: Secondary | ICD-10-CM | POA: Insufficient documentation

## 2018-12-22 DIAGNOSIS — Z79899 Other long term (current) drug therapy: Secondary | ICD-10-CM | POA: Insufficient documentation

## 2018-12-22 DIAGNOSIS — Z9884 Bariatric surgery status: Secondary | ICD-10-CM | POA: Insufficient documentation

## 2018-12-22 DIAGNOSIS — K210 Gastro-esophageal reflux disease with esophagitis: Secondary | ICD-10-CM | POA: Insufficient documentation

## 2018-12-22 DIAGNOSIS — E063 Autoimmune thyroiditis: Secondary | ICD-10-CM | POA: Insufficient documentation

## 2018-12-22 DIAGNOSIS — E669 Obesity, unspecified: Secondary | ICD-10-CM | POA: Diagnosis not present

## 2018-12-22 DIAGNOSIS — M109 Gout, unspecified: Secondary | ICD-10-CM | POA: Insufficient documentation

## 2018-12-22 DIAGNOSIS — Z96652 Presence of left artificial knee joint: Secondary | ICD-10-CM | POA: Diagnosis not present

## 2018-12-22 DIAGNOSIS — Z888 Allergy status to other drugs, medicaments and biological substances status: Secondary | ICD-10-CM | POA: Insufficient documentation

## 2018-12-22 DIAGNOSIS — Z7984 Long term (current) use of oral hypoglycemic drugs: Secondary | ICD-10-CM | POA: Diagnosis not present

## 2018-12-22 DIAGNOSIS — Z79890 Hormone replacement therapy (postmenopausal): Secondary | ICD-10-CM | POA: Insufficient documentation

## 2018-12-22 DIAGNOSIS — I10 Essential (primary) hypertension: Secondary | ICD-10-CM | POA: Diagnosis not present

## 2018-12-22 DIAGNOSIS — Z881 Allergy status to other antibiotic agents status: Secondary | ICD-10-CM | POA: Diagnosis not present

## 2018-12-22 DIAGNOSIS — M75101 Unspecified rotator cuff tear or rupture of right shoulder, not specified as traumatic: Secondary | ICD-10-CM | POA: Diagnosis not present

## 2018-12-22 HISTORY — PX: SHOULDER ARTHROSCOPY WITH OPEN ROTATOR CUFF REPAIR: SHX6092

## 2018-12-22 LAB — GLUCOSE, CAPILLARY
Glucose-Capillary: 99 mg/dL (ref 70–99)
Glucose-Capillary: 146 mg/dL — ABNORMAL HIGH (ref 70–99)
Glucose-Capillary: 163 mg/dL — ABNORMAL HIGH (ref 70–99)

## 2018-12-22 SURGERY — ARTHROSCOPY, SHOULDER WITH REPAIR, ROTATOR CUFF, OPEN
Anesthesia: General | Site: Shoulder | Laterality: Right

## 2018-12-22 MED ORDER — PHENYLEPHRINE HCL-NACL 10-0.9 MG/250ML-% IV SOLN
INTRAVENOUS | Status: DC | PRN
  Administered 2018-12-22: 25 ug/min via INTRAVENOUS

## 2018-12-22 MED ORDER — CEFAZOLIN SODIUM-DEXTROSE 2-4 GM/100ML-% IV SOLN
INTRAVENOUS | Status: AC
  Filled 2018-12-22: qty 100

## 2018-12-22 MED ORDER — OXYCODONE HCL 5 MG PO TABS
ORAL_TABLET | ORAL | Status: AC
  Filled 2018-12-22: qty 1

## 2018-12-22 MED ORDER — SUGAMMADEX SODIUM 200 MG/2ML IV SOLN
INTRAVENOUS | Status: DC | PRN
  Administered 2018-12-22: 200 mg via INTRAVENOUS

## 2018-12-22 MED ORDER — LIDOCAINE HCL (CARDIAC) PF 100 MG/5ML IV SOSY
PREFILLED_SYRINGE | INTRAVENOUS | Status: DC | PRN
  Administered 2018-12-22: 100 mg via INTRAVENOUS

## 2018-12-22 MED ORDER — DEXAMETHASONE SODIUM PHOSPHATE 10 MG/ML IJ SOLN
INTRAMUSCULAR | Status: AC
  Filled 2018-12-22: qty 1

## 2018-12-22 MED ORDER — METOCLOPRAMIDE HCL 10 MG PO TABS: 5.0000 mg | ORAL_TABLET | Freq: Three times a day (TID) | ORAL | Status: DC | PRN

## 2018-12-22 MED ORDER — FENTANYL CITRATE (PF) 100 MCG/2ML IJ SOLN: 25.0000 ug | INTRAMUSCULAR | Status: DC | PRN

## 2018-12-22 MED ORDER — BUPIVACAINE HCL (PF) 0.5 % IJ SOLN
INTRAMUSCULAR | Status: AC
  Filled 2018-12-22: qty 10

## 2018-12-22 MED ORDER — ROCURONIUM BROMIDE 100 MG/10ML IV SOLN
INTRAVENOUS | Status: DC | PRN
  Administered 2018-12-22: 80 mg via INTRAVENOUS
  Administered 2018-12-22: 30 mg via INTRAVENOUS

## 2018-12-22 MED ORDER — LACTATED RINGERS IV SOLN
INTRAVENOUS | Status: DC | PRN
  Administered 2018-12-22: 2 mL

## 2018-12-22 MED ORDER — ONDANSETRON HCL 4 MG/2ML IJ SOLN
INTRAMUSCULAR | Status: DC | PRN
  Administered 2018-12-22: 4 mg via INTRAVENOUS

## 2018-12-22 MED ORDER — ONDANSETRON HCL 4 MG/2ML IJ SOLN
4.0000 mg | Freq: Once | INTRAMUSCULAR | Status: AC
  Administered 2018-12-22: 4 mg via INTRAVENOUS

## 2018-12-22 MED ORDER — SUGAMMADEX SODIUM 200 MG/2ML IV SOLN
INTRAVENOUS | Status: AC
  Filled 2018-12-22: qty 2

## 2018-12-22 MED ORDER — DEXAMETHASONE SODIUM PHOSPHATE 4 MG/ML IJ SOLN
INTRAMUSCULAR | Status: DC | PRN
  Administered 2018-12-22: 5 mg via INTRAVENOUS

## 2018-12-22 MED ORDER — BUPIVACAINE LIPOSOME 1.3 % IJ SUSP
INTRAMUSCULAR | Status: AC
  Filled 2018-12-22: qty 20

## 2018-12-22 MED ORDER — ONDANSETRON HCL 4 MG PO TABS: 4.0000 mg | ORAL_TABLET | Freq: Four times a day (QID) | ORAL | Status: DC | PRN

## 2018-12-22 MED ORDER — BUPIVACAINE HCL (PF) 0.5 % IJ SOLN
INTRAMUSCULAR | Status: DC | PRN
  Administered 2018-12-22: 7 mL via PERINEURAL
  Administered 2018-12-22: 3 mL via PERINEURAL

## 2018-12-22 MED ORDER — ONDANSETRON 4 MG PO TBDP: 4.0000 mg | ORAL_TABLET | Freq: Three times a day (TID) | ORAL | 1 refills | Status: DC | PRN

## 2018-12-22 MED ORDER — OXYCODONE HCL 5 MG PO TABS: 5.0000 mg | ORAL_TABLET | Freq: Once | ORAL | Status: DC | PRN

## 2018-12-22 MED ORDER — FENTANYL CITRATE (PF) 250 MCG/5ML IJ SOLN
INTRAMUSCULAR | Status: AC
  Filled 2018-12-22: qty 5

## 2018-12-22 MED ORDER — BUPIVACAINE LIPOSOME 1.3 % IJ SUSP
INTRAMUSCULAR | Status: DC | PRN
  Administered 2018-12-22: 13 mL via PERINEURAL
  Administered 2018-12-22: 7 mL via PERINEURAL

## 2018-12-22 MED ORDER — ONDANSETRON HCL 4 MG/2ML IJ SOLN
INTRAMUSCULAR | Status: AC
  Filled 2018-12-22: qty 2

## 2018-12-22 MED ORDER — LIDOCAINE HCL (PF) 2 % IJ SOLN
INTRAMUSCULAR | Status: AC
  Filled 2018-12-22: qty 10

## 2018-12-22 MED ORDER — PROPOFOL 10 MG/ML IV BOLUS
INTRAVENOUS | Status: DC | PRN
  Administered 2018-12-22: 30 mg via INTRAVENOUS
  Administered 2018-12-22: 170 mg via INTRAVENOUS

## 2018-12-22 MED ORDER — LIDOCAINE HCL (PF) 1 % IJ SOLN
INTRAMUSCULAR | Status: AC
  Filled 2018-12-22: qty 5

## 2018-12-22 MED ORDER — METOCLOPRAMIDE HCL 5 MG/ML IJ SOLN
5.0000 mg | Freq: Three times a day (TID) | INTRAMUSCULAR | Status: DC | PRN
  Administered 2018-12-22 (×2): 5 mg via INTRAVENOUS

## 2018-12-22 MED ORDER — FENTANYL CITRATE (PF) 100 MCG/2ML IJ SOLN
INTRAMUSCULAR | Status: AC
  Administered 2018-12-22: 07:00:00 50 ug via INTRAVENOUS
  Filled 2018-12-22: qty 2

## 2018-12-22 MED ORDER — MIDAZOLAM HCL 2 MG/2ML IJ SOLN
1.0000 mg | Freq: Once | INTRAMUSCULAR | Status: AC
  Administered 2018-12-22: 07:00:00 1 mg via INTRAVENOUS

## 2018-12-22 MED ORDER — BUPIVACAINE-EPINEPHRINE 0.5% -1:200000 IJ SOLN
INTRAMUSCULAR | Status: DC | PRN
  Administered 2018-12-22: 30 mL

## 2018-12-22 MED ORDER — LACTATED RINGERS IV SOLN
INTRAVENOUS | Status: DC
  Administered 2018-12-22: 07:00:00 via INTRAVENOUS

## 2018-12-22 MED ORDER — OXYCODONE HCL 5 MG PO TABS
5.0000 mg | ORAL_TABLET | ORAL | Status: DC | PRN
  Administered 2018-12-22: 12:00:00 5 mg via ORAL

## 2018-12-22 MED ORDER — PROPOFOL 10 MG/ML IV BOLUS
INTRAVENOUS | Status: AC
  Filled 2018-12-22: qty 20

## 2018-12-22 MED ORDER — FENTANYL CITRATE (PF) 100 MCG/2ML IJ SOLN
50.0000 ug | Freq: Once | INTRAMUSCULAR | Status: AC
  Administered 2018-12-22: 07:00:00 50 ug via INTRAVENOUS

## 2018-12-22 MED ORDER — ONDANSETRON HCL 4 MG/2ML IJ SOLN: 4.0000 mg | Freq: Four times a day (QID) | INTRAMUSCULAR | Status: DC | PRN

## 2018-12-22 MED ORDER — ROCURONIUM BROMIDE 50 MG/5ML IV SOLN
INTRAVENOUS | Status: AC
  Filled 2018-12-22: qty 2

## 2018-12-22 MED ORDER — MIDAZOLAM HCL 2 MG/2ML IJ SOLN
INTRAMUSCULAR | Status: AC
  Administered 2018-12-22: 07:00:00 1 mg via INTRAVENOUS
  Filled 2018-12-22: qty 2

## 2018-12-22 MED ORDER — METOCLOPRAMIDE HCL 5 MG/ML IJ SOLN
INTRAMUSCULAR | Status: AC
  Administered 2018-12-22: 13:00:00 5 mg via INTRAVENOUS
  Filled 2018-12-22: qty 2

## 2018-12-22 MED ORDER — OXYCODONE HCL 5 MG/5ML PO SOLN: 5.0000 mg | Freq: Once | ORAL | Status: DC | PRN

## 2018-12-22 MED ORDER — FENTANYL CITRATE (PF) 100 MCG/2ML IJ SOLN
INTRAMUSCULAR | Status: DC | PRN
  Administered 2018-12-22: 75 ug via INTRAVENOUS
  Administered 2018-12-22: 25 ug via INTRAVENOUS
  Administered 2018-12-22: 75 ug via INTRAVENOUS

## 2018-12-22 MED ORDER — EPINEPHRINE PF 1 MG/ML IJ SOLN
INTRAMUSCULAR | Status: AC
  Filled 2018-12-22: qty 2

## 2018-12-22 MED ORDER — BUPIVACAINE-EPINEPHRINE (PF) 0.5% -1:200000 IJ SOLN
INTRAMUSCULAR | Status: AC
  Filled 2018-12-22: qty 30

## 2018-12-22 MED ORDER — PHENYLEPHRINE HCL (PRESSORS) 10 MG/ML IV SOLN
INTRAVENOUS | Status: AC
  Filled 2018-12-22: qty 1

## 2018-12-22 MED ORDER — MIDAZOLAM HCL 2 MG/2ML IJ SOLN
INTRAMUSCULAR | Status: AC
  Filled 2018-12-22: qty 2

## 2018-12-22 MED ORDER — OXYCODONE HCL 5 MG PO TABS: 5.0000 mg | ORAL_TABLET | ORAL | 0 refills | Status: DC | PRN

## 2018-12-22 MED ORDER — POTASSIUM CHLORIDE IN NACL 20-0.9 MEQ/L-% IV SOLN: INTRAVENOUS | Status: DC

## 2018-12-22 SURGICAL SUPPLY — 60 items
ANCH SUT 2 2.9 2 LD TPR NDL (Anchor) ×1 IMPLANT
ANCH SUT 2 2/0 ABS BRD STRL (Anchor) ×1 IMPLANT
ANCH SUT KNTLS STRL SHLDR SYS (Anchor) ×2 IMPLANT
ANCH SUT Q-FX 2.8 (Anchor) ×3 IMPLANT
ANCHOR ALL-SUT Q-FIX 2.8 (Anchor) ×6 IMPLANT
ANCHOR JUGGERKNOT WTAP NDL 2.9 (Anchor) ×2 IMPLANT
ANCHOR SUT QUATTRO KNTLS 4.5 (Anchor) ×4 IMPLANT
ANCHOR SUT W/ ORTHOCORD (Anchor) ×2 IMPLANT
APL PRP STRL LF DISP 70% ISPRP (MISCELLANEOUS) ×2
Anchor Stainless Surgical needles ×2 IMPLANT
BIT DRILL JUGRKNT W/NDL BIT2.9 (DRILL) IMPLANT
BLADE FULL RADIUS 3.5 (BLADE) ×3 IMPLANT
BUR ACROMIONIZER 4.0 (BURR) ×3 IMPLANT
CANNULA SHAVER 8MMX76MM (CANNULA) ×3 IMPLANT
CHLORAPREP W/TINT 26 (MISCELLANEOUS) ×5 IMPLANT
COVER MAYO STAND REUSABLE (DRAPES) ×3 IMPLANT
COVER WAND RF STERILE (DRAPES) ×3 IMPLANT
DRAPE IMP U-DRAPE 54X76 (DRAPES) ×6 IMPLANT
DRAPE SPLIT 6X30 W/TAPE (DRAPES) ×6 IMPLANT
DRILL JUGGERKNOT W/NDL BIT 2.9 (DRILL) ×3
ELECT CAUTERY BLADE TIP 2.5 (TIP) ×3
ELECT REM PT RETURN 9FT ADLT (ELECTROSURGICAL) ×3
ELECTRODE CAUTERY BLDE TIP 2.5 (TIP) ×1 IMPLANT
ELECTRODE REM PT RTRN 9FT ADLT (ELECTROSURGICAL) ×1 IMPLANT
GAUZE SPONGE 4X4 12PLY STRL (GAUZE/BANDAGES/DRESSINGS) ×3 IMPLANT
GAUZE XEROFORM 1X8 LF (GAUZE/BANDAGES/DRESSINGS) ×3 IMPLANT
GLOVE BIO SURGEON STRL SZ7.5 (GLOVE) ×8 IMPLANT
GLOVE BIO SURGEON STRL SZ8 (GLOVE) ×10 IMPLANT
GLOVE BIOGEL PI IND STRL 8 (GLOVE) ×1 IMPLANT
GLOVE BIOGEL PI INDICATOR 8 (GLOVE) ×4
GLOVE INDICATOR 8.0 STRL GRN (GLOVE) ×3 IMPLANT
GOWN STRL REUS W/ TWL LRG LVL3 (GOWN DISPOSABLE) ×1 IMPLANT
GOWN STRL REUS W/ TWL XL LVL3 (GOWN DISPOSABLE) ×1 IMPLANT
GOWN STRL REUS W/TWL LRG LVL3 (GOWN DISPOSABLE) ×6
GOWN STRL REUS W/TWL XL LVL3 (GOWN DISPOSABLE) ×3
GRAFT TISS 40X70 3 THK DERM (Tissue) IMPLANT
GRASPER SUT 15 45D LOW PRO (SUTURE) ×2 IMPLANT
IV LACTATED RINGER IRRG 3000ML (IV SOLUTION) ×3
IV LR IRRIG 3000ML ARTHROMATIC (IV SOLUTION) ×2 IMPLANT
KIT SUTURE 2.8 Q-FIX DISP (MISCELLANEOUS) ×2 IMPLANT
MANIFOLD NEPTUNE II (INSTRUMENTS) ×3 IMPLANT
MASK FACE SPIDER DISP (MASK) ×3 IMPLANT
MAT ABSORB  FLUID 56X50 GRAY (MISCELLANEOUS) ×2
MAT ABSORB FLUID 56X50 GRAY (MISCELLANEOUS) ×1 IMPLANT
PACK ARTHROSCOPY SHOULDER (MISCELLANEOUS) ×3 IMPLANT
PASSER SUT FIRSTPASS SELF (INSTRUMENTS) ×2 IMPLANT
SLING ARM LRG DEEP (SOFTGOODS) ×1 IMPLANT
SLING ULTRA II LG (MISCELLANEOUS) ×3 IMPLANT
STAPLER SKIN PROX 35W (STAPLE) ×3 IMPLANT
STRAP SAFETY 5IN WIDE (MISCELLANEOUS) ×3 IMPLANT
SUT ETHIBOND 0 MO6 C/R (SUTURE) ×3 IMPLANT
SUT VIC AB 2-0 CT1 27 (SUTURE) ×6
SUT VIC AB 2-0 CT1 TAPERPNT 27 (SUTURE) ×2 IMPLANT
SUTURE MAGNUM WIRE 2X48 BLK (SUTURE) ×10 IMPLANT
TAPE MICROFOAM 4IN (TAPE) ×3 IMPLANT
TISSUE ARTHOFLEX THICK 3MM (Tissue) ×3 IMPLANT
TUBING ARTHRO INFLOW-ONLY STRL (TUBING) ×3 IMPLANT
TUBING CONNECTING 10 (TUBING) ×2 IMPLANT
TUBING CONNECTING 10' (TUBING) ×1
WAND WEREWOLF FLOW 90D (MISCELLANEOUS) ×3 IMPLANT

## 2018-12-22 NOTE — Transfer of Care (Signed)
Immediate Anesthesia Transfer of Care Note  Patient: [facility]  Procedure(s) Performed: SHOULDER ARTHROSCOPY WITH OPEN ROTATOR CUFF REPAIR (Right Shoulder)  Patient Location: PACU  Anesthesia Type:General  Level of Consciousness: awake, alert  and oriented  Airway & Oxygen Therapy: Patient Spontanous Breathing and Patient connected to nasal cannula oxygen  Post-op Assessment: Report given to RN and Post -op Vital signs reviewed and stable  Post vital signs: Reviewed and stable  Last Vitals:  Vitals Value Taken Time  BP 171/96 12/22/18 1042  Temp    Pulse 100 12/22/18 1042  Resp 18 12/22/18 1042  SpO2 96 % 12/22/18 1042  Vitals shown include unvalidated device data.  Last Pain:  Vitals:   12/22/18 0629  TempSrc: Tympanic  PainSc: 0-No pain         Complications: No apparent anesthesia complications

## 2018-12-22 NOTE — Op Note (Signed)
12/22/2018  10:29 AM  Patient:   Lori Le  Pre-Op Diagnosis:   Recurrent large rotator cuff tear, right shoulder.  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  Procedure:   Limited arthroscopic debridement, arthroscopic subscapularis tendon repair, arthroscopic subacromial decompression, mini-open repair of massive rotator cuff tear with dermal bridging graft, and mini-open biceps tenodesis, right shoulder.  Anesthesia:   General endotracheal with interscalene block using Exparel placed preoperatively by the anesthesiologist.  Surgeon:   Pascal Lux, MD  Assistant:   Cameron Proud, PA-C; Orland Penman, PA-S  Findings:   As above. There was a massive recurrent rotator cuff tear with complete loss of the supraspinatus and infraspinatus tendon attachments and retraction back to the glenohumeral joint. The superior portion of the subscapularis tendon also was torn. There was moderate tendinopathy of the biceps tendon without partial or full-thickness tearing. The labrum demonstrated fraying anteriorly and superiorly but otherwise was intact with no detachment from the glenoid rim. The articular surfaces of the glenoid and humerus both were in satisfactory condition.  Complications:   None  Fluids:   750 cc  Estimated blood loss:   15 cc  Tourniquet time:   None  Drains:   None  Closure:   Staples      Brief clinical note:   The patient is a 53 year old female who underwent a rotator cuff repair 2 years ago in [place]. The patient was doing well until she apparently slipped descending some stairs. As she reached out to grab the railing with her right arm, she felt a tearing sensation in the shoulder. Subsequently, she began to have pain and weakness in the right shoulder. These symptoms have progressed despite medications, activity modification, etc. The patient's history and examination are consistent with a recurrent rotator  cuff tear. These findings were confirmed by MRI scan. The patient presents at this time for definitive management of these shoulder symptoms.  Procedure:   The patient underwent placement of an interscalene block using Exparel by the anesthesiologist in the preoperative holding area before being brought into the operating room and lain in the supine position. The patient then underwent general endotracheal intubation and anesthesia before being repositioned in the beach chair position using the beach chair positioner. The right shoulder and upper extremity were prepped with ChloraPrep solution before being draped sterilely. Preoperative antibiotics were administered. A timeout was performed to confirm the proper surgical site before the expected portal sites and incision site were injected with 0.5% Sensorcaine with epinephrine. A posterior portal was created and the glenohumeral joint thoroughly inspected with the findings as described above. An anterior portal was created using an outside-in technique. The labrum and rotator cuff were further probed, again confirming the above-noted findings. Areas of labral fraying and synovitis were debrided back to stable margins using the full-radius resector. The ArthroCare wand was inserted and used to release the biceps tendon from its labral anchor. It also was used to obtain hemostasis as well as to "anneal" the labrum superiorly and anteriorly.   The subscapularis tendon was repaired using a single Mytec BioKnotless anchor placed through an anterior portal. A superolateral portal was created using an outside in technique to act as a working portal in order to assist this repair. Subsequent assessment of the repair demonstrated good stability as demonstrated both by probing and with passive external rotation of the shoulder. The instruments were removed from the joint after suctioning the excess fluid.  The camera was repositioned through the posterior portal into  the subacromial space. A separate lateral portal was created using an outside-in technique. The 3.5 mm full-radius resector was introduced and used to perform a subtotal bursectomy. The ArthroCare wand was then inserted and used to remove the periosteal tissue off the undersurface of the anterior third of the acromion as well as to recess the coracoacromial ligament from its attachment along the anterior and lateral margins of the acromion. The 4.0 mm acromionizing bur was introduced and used to complete the decompression by removing the undersurface of the anterior third of the acromion. The full radius resector was reintroduced to remove any residual bony debris before the ArthroCare wand was reintroduced to obtain hemostasis. The instruments were then removed from the subacromial space after suctioning the excess fluid.  Utilizing the previous incision, an approximately 4-5 cm incision was made over the anterolateral aspect of the shoulder beginning at the anterolateral corner of the acromion and extending distally in line with the bicipital groove. This incision was carried down through the subcutaneous tissues to expose the deltoid fascia. The raphae between the anterior and middle thirds was identified and this plane developed to provide access into the subacromial space. Additional bursal tissues were debrided sharply using Metzenbaum scissors. The rotator cuff tear was readily identified. The margins were debrided sharply with a #15 blade and the exposed greater tuberosity roughened with a rongeur. Extensive mobilization was done with the remaining rotator cuff tissue but it was clear that the tendon could not be brought back all the way to the greater tuberosity. The tear was repaired using three [employer]hew 2.8 mm Q-Fix anchors anchors. The first two were placed to stabilize the anterior and posterior margins of the tear while the third was placed more centrally and used to stabilize the dermal  allograft.  Once it was clear that the cuff could not be fully mobilized and brought back to the greater tuberosity without undue tension, a 3-3.5 mm thick dermal allograft was selected and cut to the appropriate size. Three additional #2 FiberWire sutures were passed through the lateral margin of the rotator cuff centrally in a horizontal mattress pattern. These sutures were passed through the graft, as were the sutures from the anterior and posterior anchors and tied securely. In addition, the sutures from the third more central anchor were passed through the graft and tied securely. Most of these sutures were then brought back laterally and secured using two [place] QuatroLink anchors to create a two-layer closure. In addition, a single #0 FiberWire suture was used to close the posterior margin of the tear in a side-to-side fashion. An apparent watertight closure was obtained.  The bicipital groove was identified by palpation and opened for 1-1.5 cm. The biceps tendon stump was retrieved through this defect. The floor of the bicipital groove was roughened with a curet before a single Biomet 2.9 mm JuggerKnot anchor was inserted. Both sets of sutures were passed through the biceps tendon and tied securely to effect the tenodesis. The bicipital sheath was reapproximated using two #0 Ethibond interrupted sutures, incorporating the biceps tendon to further reinforce the tenodesis.  The wound was copiously irrigated with sterile saline solution before the deltoid raphae was reapproximated using 2-0 Vicryl interrupted sutures. The subcutaneous tissues were closed in two layers using 2-0 Vicryl interrupted sutures before the skin was closed using staples. The portal sites also were closed using staples. A sterile bulky dressing was applied to the shoulder before  the arm was placed into a shoulder immobilizer. The patient was then awakened, extubated, and returned to the recovery room in satisfactory condition  after tolerating the procedure well.

## 2018-12-22 NOTE — Anesthesia Postprocedure Evaluation (Signed)
Anesthesia Post Note  Patient: [facility]  Procedure(s) Performed: SHOULDER ARTHROSCOPY WITH OPEN ROTATOR CUFF REPAIR (Right Shoulder)  Patient location during evaluation: PACU Anesthesia Type: General Level of consciousness: awake and alert Pain management: pain level controlled Vital Signs Assessment: post-procedure vital signs reviewed and stable Respiratory status: spontaneous breathing, nonlabored ventilation, respiratory function stable and patient connected to nasal cannula oxygen Cardiovascular status: blood pressure returned to baseline and stable Postop Assessment: no apparent nausea or vomiting Anesthetic complications: no     Last Vitals:  Vitals:   12/22/18 0718 12/22/18 1042  BP: 109/69 (!) 171/96  Pulse: 65 100  Resp: 16 18  Temp:  36.6 C  SpO2: 100% 96%    Last Pain:  Vitals:   12/22/18 1042  TempSrc:   PainSc: 0-No pain                 Molli Barrows

## 2018-12-22 NOTE — OR Nursing (Signed)
Pt feeling very nauseated. Vomited a small amount of clear fluid. Pt is also complaining of pain states block did not work rates pain at 8. Reglan given and plan to follow with pain medication.

## 2018-12-22 NOTE — H&P (Signed)
Paper H&P to be scanned into permanent record. H&P reviewed and patient re-examined. No changes. 

## 2018-12-22 NOTE — Anesthesia Post-op Follow-up Note (Signed)
Anesthesia QCDR form completed.        

## 2018-12-22 NOTE — Anesthesia Preprocedure Evaluation (Signed)
Anesthesia Evaluation  Patient identified by MRN, date of birth, ID band Patient awake    Reviewed: Allergy & Precautions, H&P , NPO status , Patient's Chart, lab work & pertinent test results  History of Anesthesia Complications (+) PONV, PROLONGED EMERGENCE, Family history of anesthesia reaction and history of anesthetic complications  Airway Mallampati: II  TM Distance: >3 FB Neck ROM: full    Dental  (+) Chipped   Pulmonary neg pulmonary ROS, neg shortness of breath,           Cardiovascular Exercise Tolerance: Good hypertension, (-) angina(-) Past MI and (-) DOE      Neuro/Psych  Headaches, PSYCHIATRIC DISORDERS    GI/Hepatic Neg liver ROS, PUD, GERD  Medicated and Controlled,  Endo/Other  Hypothyroidism   Renal/GU      Musculoskeletal  (+) Arthritis ,   Abdominal   Peds  Hematology negative hematology ROS (+)   Anesthesia Other Findings Past Medical History: No date: Allergic rhinitis due to allergen No date: Allergy No date: Anemia No date: Anxiety No date: Arthritis No date: B12 deficiency No date: Cancer ([place])     Comment:  skin ca-melanoma No date: Chronic idiopathic urticaria No date: Complication of anesthesia     Comment:  nausea, slow to wake up No date: Depression No date: Edema No date: Family history of adverse reaction to anesthesia     Comment:  Father - PONV No date: Gastric ulcer No date: GERD (gastroesophageal reflux disease) No date: Gout No date: Gout No date: Hashimoto's thyroiditis No date: Heart murmur No date: History of eating disorder No date: History of kidney stones     Comment:  h/o years ago No date: Hives No date: Hyperlipidemia No date: Hypertension No date: Hypothyroidism No date: Lower extremity edema No date: Melanoma in situ ([facility])     Comment:  left shoulder No date: Migraine     Comment:  approx 4x/yr-migraines No date: Mitral valve disorder No date:  Motion sickness     Comment:  all moving vehicles No date: Neuropathy     Comment:  fingers and feet No date: PCOS (polycystic ovarian syndrome) No date: PONV (postoperative nausea and vomiting) No date: Psoriatic arthritis ([place]) No date: Thyroid disease No date: Ulcer No date: Vertigo     Comment:  last episode over 1 yr ago  Past Surgical History: 09/03/2018: BONE EXCISION; Left     Comment:  Procedure: PART EXCISION BONE-PHALANX 2,3,4 LEFT;                Surgeon: Albertine Patricia, DPM;  Location: [place];  Service: Podiatry;  Laterality: Left; 2007: BREAST BIOPSY; Right     Comment:  neg- bx/clip 2003: CHOLECYSTECTOMY 05/11/2018: COLONOSCOPY WITH PROPOFOL; N/A     Comment:  Procedure: COLONOSCOPY WITH PROPOFOL;  Surgeon:               [name], MD;  Location: [facility];                Service: Endoscopy;  Laterality: N/A; 08/19/2016: [place]; N/A     Comment:  Procedure: DILATATION & CURETTAGE/HYSTEROSCOPY WITH               NOVASURE ABLATION;  Surgeon: [name], MD;                Location: [facility];  Service: Gynecology;  Laterality:               N/A; 12/05/2015: ESOPHAGOGASTRODUODENOSCOPY (EGD) WITH PROPOFOL; N/A     Comment:  Procedure: ESOPHAGOGASTRODUODENOSCOPY (EGD) WITH               PROPOFOL;  Surgeon: Lollie Sails, MD;  Location:               [facility] ENDOSCOPY;  Service: Endoscopy;  Laterality: N/A; 02/06/2016: ESOPHAGOGASTRODUODENOSCOPY (EGD) WITH PROPOFOL; N/A     Comment:  Procedure: ESOPHAGOGASTRODUODENOSCOPY (EGD) WITH               PROPOFOL;  Surgeon: Lollie Sails, MD;  Location:               [facility] ENDOSCOPY;  Service: Endoscopy;  Laterality: N/A; 05/11/2018: ESOPHAGOGASTRODUODENOSCOPY (EGD) WITH PROPOFOL; N/A     Comment:  Procedure: ESOPHAGOGASTRODUODENOSCOPY (EGD) WITH               PROPOFOL;  Surgeon: [name], MD;  Location:                [facility];  Service: Endoscopy;  Laterality: N/A; 09/08/2018: ESOPHAGOGASTRODUODENOSCOPY (EGD) WITH PROPOFOL; N/A     Comment:  Procedure: ESOPHAGOGASTRODUODENOSCOPY (EGD) WITH BIOPSY;              Surgeon: [name], MD;  Location: [place];  Service: Endoscopy;  Laterality: N/A; 09/03/2018: FOOT ARTHRODESIS; Left     Comment:  Procedure: ARTHRODESIS; HALLUX/IP JOINT LEFT;  Surgeon:               Albertine Patricia, DPM;  Location: [place];                Service: Podiatry;  Laterality: Left;  LMA LOCAL 1994: FOOT SURGERY 2011: GASTRIC BYPASS 09/27/2014: JOINT REPLACEMENT; Left     Comment:  HIP 12/01/2014: JOINT REPLACEMENT; Left     Comment:  KNEE No date: KNEE ARTHROSCOPY; Right 03/23/2015: KNEE ARTHROSCOPY; Left     Comment:  Procedure: ARTHROSCOPY KNEE, partial synovectomy;                Surgeon: Hessie Knows, MD;  Location: [facility];  Service:              Orthopedics;  Laterality: Left; 2010: LAPAROSCOPIC GASTRIC BANDING 09/08/2018: POLYPECTOMY; N/A     Comment:  Procedure: POLYPECTOMY;  Surgeon: [name],               MD;  Location: [place];  Service: Endoscopy;               Laterality: N/A;  Stomach No date: ROTATOR CUFF REPAIR; Bilateral     Comment:  right shoulder 07-26-2016 06/20/2015: SHOULDER ARTHROSCOPY; Left     Comment:  Procedure: ARTHROSCOPY SHOULDER, REPAIR OF MASSIVE               ROTATOR CUFF TEAR, TENODESIS, DECOMPRESSION DEBRIDEMENT;               Surgeon: Corky Mull, MD;  Location: [facility];  Service:              Orthopedics;  Laterality: Left; 09/27/2014: TOTAL HIP ARTHROPLASTY; Left     Comment:  Procedure: TOTAL HIP ARTHROPLASTY ANTERIOR APPROACH;                Surgeon: Legrand Como  Rudene Christians, MD;  Location: [facility];  Service:              Orthopedics;  Laterality: Left; 12/01/2014: TOTAL KNEE ARTHROPLASTY; Left     Comment:  Procedure: TOTAL KNEE ARTHROPLASTY;  Surgeon: Hessie Knows, MD;  Location: [facility];  Service: Orthopedics;                Laterality: Left;     Reproductive/Obstetrics negative OB ROS                             Anesthesia Physical Anesthesia Plan  ASA: III  Anesthesia Plan: General ETT   Post-op Pain Management: GA combined w/ Regional for post-op pain   Induction: Intravenous  PONV Risk Score and Plan: Ondansetron, Dexamethasone, Midazolam and Treatment may vary due to age or medical condition  Airway Management Planned: Oral ETT  Additional Equipment:   Intra-op Plan:   Post-operative Plan: Extubation in OR  Informed Consent: I have reviewed the patients History and Physical, chart, labs and discussed the procedure including the risks, benefits and alternatives for the proposed anesthesia with the patient or authorized representative who has indicated his/her understanding and acceptance.     Dental Advisory Given  Plan Discussed with: Anesthesiologist, CRNA and Surgeon  Anesthesia Plan Comments: (Patient consented for risks of anesthesia including but not limited to:  - adverse reactions to medications - damage to teeth, lips or other oral mucosa - sore throat or hoarseness - Damage to heart, brain, lungs or loss of life  Patient voiced understanding.)        Anesthesia Quick Evaluation

## 2018-12-22 NOTE — Anesthesia Procedure Notes (Signed)
Procedure Name: Intubation Date/Time: 12/22/2018 7:39 AM Performed by: Bernardo Heater, CRNA Pre-anesthesia Checklist: Patient identified, Patient being monitored, Timeout performed, Emergency Drugs available and Suction available Patient Re-evaluated:Patient Re-evaluated prior to induction Oxygen Delivery Method: Circle system utilized Preoxygenation: Pre-oxygenation with 100% oxygen Induction Type: IV induction Ventilation: Mask ventilation without difficulty Laryngoscope Size: Mac and 3 Grade View: Grade I Tube type: Oral Tube size: 7.0 mm Number of attempts: 1 Placement Confirmation: ETT inserted through vocal cords under direct vision,  positive ETCO2 and breath sounds checked- equal and bilateral Secured at: 21 cm Tube secured with: Tape Dental Injury: Teeth and Oropharynx as per pre-operative assessment

## 2018-12-22 NOTE — Anesthesia Procedure Notes (Signed)
Anesthesia Regional Block: Interscalene brachial plexus block   Pre-Anesthetic Checklist: ,, timeout performed, Correct Patient, Correct Site, Correct Laterality, Correct Procedure, Correct Position, site marked, Risks and benefits discussed,  Surgical consent,  Pre-op evaluation,  At surgeon's request and post-op pain management  Laterality: Upper and Right  Prep: chloraprep       Needles:  Injection technique: Single-shot  Needle Type: Stimiplex     Needle Length: 5cm  Needle Gauge: 22     Additional Needles:   Procedures:,,,, ultrasound used (permanent image in chart),,,,  Narrative:  Start time: 12/22/2018 7:13 AM End time: 12/22/2018 7:16 AM Injection made incrementally with aspirations every 5 mL.  Performed by: Personally  Anesthesiologist: Jeanne Diefendorf, Precious Haws, MD  Additional Notes: Patient consented for risk and benefits of nerve block including but not limited to nerve damage, failed block, bleeding and infection.  Patient voiced understanding.  Functioning IV was confirmed and monitors were applied.  A 18mm 22ga Stimuplex needle was used. Sterile prep,hand hygiene and sterile gloves were used.  Minimal sedation used for procedure.  No paresthesia endorsed by patient during the procedure.  Negative aspiration and negative test dose prior to incremental administration of local anesthetic. The patient tolerated the procedure well with no immediate complications.

## 2018-12-22 NOTE — Progress Notes (Signed)
Right hand and finger pink in color, warm to touch, patient has sensation and able to feel me touching her fingers.

## 2018-12-22 NOTE — Discharge Instructions (Signed)
Orthopedic discharge instructions: Keep dressing dry and intact.  May shower after dressing changed on post-op day #4 (Saturday).  Cover staples with Band-Aids after drying off. Apply ice frequently to shoulder. Take ibuprofen 800 mg TID with meals for 7-10 days, then as necessary. Take oxycodone as prescribed when needed.  May supplement with ES Tylenol if necessary. Keep shoulder immobilizer on at all times except may remove for bathing purposes. Follow-up in 10-14 days or as scheduled.

## 2018-12-23 ENCOUNTER — Encounter: Payer: Self-pay | Admitting: Surgery

## 2018-12-24 ENCOUNTER — Other Ambulatory Visit (INDEPENDENT_AMBULATORY_CARE_PROVIDER_SITE_OTHER): Payer: Self-pay | Admitting: Family Medicine

## 2018-12-24 DIAGNOSIS — R7303 Prediabetes: Secondary | ICD-10-CM

## 2018-12-24 DIAGNOSIS — M7521 Bicipital tendinitis, right shoulder: Secondary | ICD-10-CM | POA: Insufficient documentation

## 2018-12-28 ENCOUNTER — Ambulatory Visit (INDEPENDENT_AMBULATORY_CARE_PROVIDER_SITE_OTHER): Payer: Managed Care, Other (non HMO) | Admitting: Internal Medicine

## 2018-12-28 ENCOUNTER — Other Ambulatory Visit: Payer: Self-pay

## 2018-12-28 ENCOUNTER — Encounter: Payer: Self-pay | Admitting: Internal Medicine

## 2018-12-28 DIAGNOSIS — D500 Iron deficiency anemia secondary to blood loss (chronic): Secondary | ICD-10-CM

## 2018-12-28 DIAGNOSIS — E538 Deficiency of other specified B group vitamins: Secondary | ICD-10-CM | POA: Diagnosis not present

## 2018-12-28 DIAGNOSIS — F32A Depression, unspecified: Secondary | ICD-10-CM

## 2018-12-28 DIAGNOSIS — Z9109 Other allergy status, other than to drugs and biological substances: Secondary | ICD-10-CM | POA: Diagnosis not present

## 2018-12-28 DIAGNOSIS — E039 Hypothyroidism, unspecified: Secondary | ICD-10-CM

## 2018-12-28 DIAGNOSIS — K219 Gastro-esophageal reflux disease without esophagitis: Secondary | ICD-10-CM

## 2018-12-28 DIAGNOSIS — I10 Essential (primary) hypertension: Secondary | ICD-10-CM

## 2018-12-28 DIAGNOSIS — E7800 Pure hypercholesterolemia, unspecified: Secondary | ICD-10-CM

## 2018-12-28 DIAGNOSIS — R600 Localized edema: Secondary | ICD-10-CM | POA: Diagnosis not present

## 2018-12-28 DIAGNOSIS — M109 Gout, unspecified: Secondary | ICD-10-CM

## 2018-12-28 DIAGNOSIS — R739 Hyperglycemia, unspecified: Secondary | ICD-10-CM

## 2018-12-28 DIAGNOSIS — L4050 Arthropathic psoriasis, unspecified: Secondary | ICD-10-CM

## 2018-12-28 DIAGNOSIS — F320 Major depressive disorder, single episode, mild: Secondary | ICD-10-CM

## 2018-12-28 NOTE — Progress Notes (Signed)
Patient ID: Lori Le, female   DOB: 07-09-1965, 53 y.o.   MRN: [id number]

## 2018-12-30 ENCOUNTER — Other Ambulatory Visit (INDEPENDENT_AMBULATORY_CARE_PROVIDER_SITE_OTHER): Payer: Self-pay | Admitting: Family Medicine

## 2018-12-30 DIAGNOSIS — E559 Vitamin D deficiency, unspecified: Secondary | ICD-10-CM

## 2018-12-31 ENCOUNTER — Telehealth (INDEPENDENT_AMBULATORY_CARE_PROVIDER_SITE_OTHER): Payer: Managed Care, Other (non HMO) | Admitting: Family Medicine

## 2018-12-31 ENCOUNTER — Encounter (INDEPENDENT_AMBULATORY_CARE_PROVIDER_SITE_OTHER): Payer: Self-pay | Admitting: Family Medicine

## 2018-12-31 ENCOUNTER — Other Ambulatory Visit: Payer: Self-pay

## 2018-12-31 ENCOUNTER — Encounter (INDEPENDENT_AMBULATORY_CARE_PROVIDER_SITE_OTHER): Payer: Self-pay

## 2018-12-31 DIAGNOSIS — R7303 Prediabetes: Secondary | ICD-10-CM | POA: Diagnosis not present

## 2018-12-31 DIAGNOSIS — Z6835 Body mass index (BMI) 35.0-35.9, adult: Secondary | ICD-10-CM | POA: Diagnosis not present

## 2018-12-31 DIAGNOSIS — L4050 Arthropathic psoriasis, unspecified: Secondary | ICD-10-CM

## 2018-12-31 MED ORDER — EPINEPHRINE 0.3 MG/0.3ML IJ SOAJ: 0.3000 mg | INTRAMUSCULAR | 0 refills | Status: AC | PRN

## 2018-12-31 MED ORDER — METFORMIN HCL 500 MG PO TABS: 500.0000 mg | ORAL_TABLET | Freq: Every day | ORAL | 0 refills | Status: DC

## 2019-01-03 ENCOUNTER — Encounter: Payer: Self-pay | Admitting: Internal Medicine

## 2019-01-03 DIAGNOSIS — L4050 Arthropathic psoriasis, unspecified: Secondary | ICD-10-CM | POA: Insufficient documentation

## 2019-01-03 NOTE — Assessment & Plan Note (Signed)
Stable.  Follow.   

## 2019-01-03 NOTE — Progress Notes (Signed)
Patient ID: Lori Le, female   DOB: [birth year], 53 y.o.   MRN: [id number]   Virtual Visit via video Note  This visit type was conducted due to national recommendations for restrictions regarding the COVID-19 pandemic (e.g. social distancing).  This format is felt to be most appropriate for this patient at this time.  All issues noted in this document were discussed and addressed.  No physical exam was performed (except for noted visual exam findings with Video Visits).   I connected with [name] by a video enabled telemedicine application and verified that I am speaking with the correct person using two identifiers. Location patient: home Location provider: work  Persons participating in the virtual visit: patient, provider  I discussed the limitations, risks, security and privacy concerns of performing an evaluation and management service by video and the availability of in person appointments.  The patient expressed understanding and agreed to proceed.   Reason for visit: scheduled follow up.   HPI: Is s/p right shoulder arthroscopy with rotator cuff repair.  Still with increased pain.  Seeing and being followed by ortho.  Has been diagnosed with psoriatic arthritis.  Not on medication for this secondary to her surgery.  Increased flare with her skin.  Handling stress relatively well.  No chest pain.  Breathing stable.  No acid reflux. No abdominal pain.  No increased swelling.     ROS: See pertinent positives and negatives per HPI.  Past Medical History:  Diagnosis Date  . Allergic rhinitis due to allergen   . Allergy   . Anemia   . Anxiety   . Arthritis   . B12 deficiency   . Cancer ([place])    skin ca-melanoma  . Chronic idiopathic urticaria   . Complication of anesthesia    nausea, slow to wake up  . Depression   . Edema   . Family history of adverse reaction to anesthesia    Father - PONV  . Gastric ulcer   . GERD (gastroesophageal reflux disease)   . Gout   .  Gout   . Hashimoto's thyroiditis   . Heart murmur   . History of eating disorder   . History of kidney stones    h/o years ago  . Hives   . Hyperlipidemia   . Hypertension   . Hypothyroidism   . Lower extremity edema   . Melanoma in situ ([place])    left shoulder  . Migraine    approx 4x/yr-migraines  . Mitral valve disorder   . Motion sickness    all moving vehicles  . Neuropathy    fingers and feet  . PCOS (polycystic ovarian syndrome)   . PONV (postoperative nausea and vomiting)   . Psoriatic arthritis ([place])   . Thyroid disease   . Ulcer   . Vertigo    last episode over 1 yr ago    Past Surgical History:  Procedure Laterality Date  . BONE EXCISION Left 09/03/2018   Procedure: PART EXCISION BONE-PHALANX 2,3,4 LEFT;  Surgeon: Albertine Patricia, DPM;  Location: [place];  Service: Podiatry;  Laterality: Left;  . BREAST BIOPSY Right 2007   neg- bx/clip  . CHOLECYSTECTOMY  2003  . COLONOSCOPY WITH PROPOFOL N/A 05/11/2018   Procedure: COLONOSCOPY WITH PROPOFOL;  Surgeon: [name], MD;  Location: [facility];  Service: Endoscopy;  Laterality: N/A;  . DILITATION & CURRETTAGE/HYSTROSCOPY WITH NOVASURE ABLATION N/A 08/19/2016   Procedure: DILATATION & CURETTAGE/HYSTEROSCOPY WITH NOVASURE ABLATION;  Surgeon: Marcelline Mates,  Dolphus Jenny, MD;  Location: [facility];  Service: Gynecology;  Laterality: N/A;  . ESOPHAGOGASTRODUODENOSCOPY (EGD) WITH PROPOFOL N/A 12/05/2015   Procedure: ESOPHAGOGASTRODUODENOSCOPY (EGD) WITH PROPOFOL;  Surgeon: Lollie Sails, MD;  [facility] END[facility]COPY;  Service: Endoscopy;  Laterality: N/A;  . ESOPHAGOGASTRODUODENOSCOPY (EGD) WITH PROPOFOL N/A 02/06/2016   Procedure: ESOPHAGOGASTRODUODENOSCOPY (EGD) WITH PROPOFOL;  Surgeon: Lollie Sails, MD;  Location: [facility] ENDOSCOPY;  Service: Endoscopy;  Laterality: N/A;  . ESOPHAGOGASTRODUODENOSCOPY (EGD) WITH PROPOFOL N/A 05/11/2018   Procedure: ESOPHAGOGASTRODUODENOSCOPY (EGD) WITH PROPOFOL;  Surgeon:  [name], MD;  Location: [facility];  Service: Endoscopy;  Laterality: N/A;  . ESOPHAGOGASTRODUODENOSCOPY (EGD) WITH PROPOFOL N/A 09/08/2018   Procedure: ESOPHAGOGASTRODUODENOSCOPY (EGD) WITH BIOPSY;  Surgeon: [name], MD;  Location: [place];  Service: Endoscopy;  Laterality: N/A;  . FOOT ARTHRODESIS Left 09/03/2018   Procedure: ARTHRODESIS; HALLUX/IP JOINT LEFT;  Surgeon: Albertine Patricia, DPM;  Location: [place];  Service: Podiatry;  Laterality: Left;  LMA LOCAL  . FOOT SURGERY  1994  . GASTRIC BYPASS  2011  . JOINT REPLACEMENT Left 09/27/2014   HIP  . JOINT REPLACEMENT Left 12/01/2014   KNEE  . KNEE ARTHROSCOPY Right   . KNEE ARTHROSCOPY Left 03/23/2015   Procedure: ARTHROSCOPY KNEE, partial synovectomy;  Surgeon: Hessie Knows, MD;  Location: [facility];  Service: Orthopedics;  Laterality: Left;  . LAPAROSCOPIC GASTRIC BANDING  2010  . POLYPECTOMY N/A 09/08/2018   Procedure: POLYPECTOMY;  Surgeon: [name], MD;  Location: [place];  Service: Endoscopy;  Laterality: N/A;  Stomach  . ROTATOR CUFF REPAIR Bilateral    right shoulder 07-26-2016  . SHOULDER ARTHROSCOPY Left 06/20/2015   Procedure: ARTHROSCOPY SHOULDER, REPAIR OF MASSIVE ROTATOR CUFF TEAR, TENODESIS, DECOMPRESSION DEBRIDEMENT;  Surgeon: Corky Mull, MD;  Location: [facility];  Service: Orthopedics;  Laterality: Left;  . SHOULDER ARTHROSCOPY WITH OPEN ROTATOR CUFF REPAIR Right 12/22/2018   Procedure: SHOULDER ARTHROSCOPY WITH OPEN ROTATOR CUFF REPAIR;  Surgeon: Corky Mull, MD;  Location: [facility];  Service: Orthopedics;  Laterality: Right;  . TOTAL HIP ARTHROPLASTY Left 09/27/2014   Procedure: TOTAL HIP ARTHROPLASTY ANTERIOR APPROACH;  Surgeon: Hessie Knows, MD;  Location: [facility];  Service: Orthopedics;  Laterality: Left;  . TOTAL KNEE ARTHROPLASTY Left 12/01/2014   Procedure: TOTAL KNEE ARTHROPLASTY;  Surgeon: Hessie Knows, MD;  Location: [facility];  Service:  Orthopedics;  Laterality: Left;    Family History  Problem Relation Age of Onset  . Arthritis Mother   . Hyperlipidemia Mother   . Hypertension Mother   . Diabetes Mother   . Sleep apnea Mother   . Obesity Mother   . Arthritis Father   . Hyperlipidemia Father   . Hypertension Father   . Mental illness Father   . Diabetes Father   . Obesity Father   . Sleep apnea Father   . Bipolar disorder Father   . Anxiety disorder Father   . Depression Father   . Cancer Father   . Kidney disease Father   . Arthritis Maternal Grandmother   . Cancer Maternal Grandmother        breast cancer  . Hyperlipidemia Maternal Grandmother   . Hypertension Maternal Grandmother   . Breast cancer Maternal Grandmother   . Arthritis Maternal Grandfather   . Hyperlipidemia Maternal Grandfather   . Hypertension Maternal Grandfather   . Heart disease Maternal Grandfather        heart attack  . Breast cancer Paternal Grandmother  SOCIAL HX: reviewed.    Current Outpatient Medications:  .  clobetasol ointment (TEMOVATE) 8.09 %, Apply 1 application topically at bedtime as needed (inflammation). , Disp: , Rfl:  .  cyanocobalamin (,VITAMIN B-12,) 1000 MCG/ML injection, ADM 1 ML IM Q 30 DAYS, Disp: 1 mL, Rfl: 5 .  EPINEPHrine 0.3 mg/0.3 mL IJ SOAJ injection, Inject 0.3 mLs (0.3 mg total) into the muscle as needed for anaphylaxis., Disp: 1 each, Rfl: 0 .  furosemide (LASIX) 40 MG tablet, Take 1 tablet (40 mg total) by mouth daily., Disp: 90 tablet, Rfl: 1 .  gabapentin (NEURONTIN) 300 MG capsule, 300 mg 2 (two) times daily. , Disp: , Rfl:  .  levothyroxine (SYNTHROID) 137 MCG tablet, Take 137 mcg by mouth daily before breakfast. , Disp: , Rfl:  .  metFORMIN (GLUCOPHAGE) 500 MG tablet, Take 1 tablet (500 mg total) by mouth daily with breakfast., Disp: 90 tablet, Rfl: 0 .  montelukast (SINGULAIR) 10 MG tablet, Take 10 mg by mouth at bedtime., Disp: , Rfl:  .  ondansetron (ZOFRAN ODT) 4 MG disintegrating  tablet, Take 1 tablet (4 mg total) by mouth every 8 (eight) hours as needed for nausea or vomiting., Disp: 20 tablet, Rfl: 1 .  oxyCODONE (ROXICODONE) 5 MG immediate release tablet, Take 1-2 tablets (5-10 mg total) by mouth every 4 (four) hours as needed., Disp: 50 tablet, Rfl: 0 .  pantoprazole (PROTONIX) 40 MG tablet, Take 40 mg by mouth every morning. , Disp: , Rfl:  .  potassium chloride SA (K-DUR) 20 MEQ tablet, Take two tablets (40 meq) by mouth daily in the morning and 1 tablet (20 meq) by mouth daily in the evening (Patient taking differently: Take 20 mEq by mouth 2 (two) times daily. Take two tablets (40 meq) by mouth daily in the morning and 1 tablet (20 meq) by mouth daily in the evening), Disp: 90 tablet, Rfl: 3 .  sertraline (ZOLOFT) 100 MG tablet, Take 1 tablet (100 mg total) by mouth daily. (Patient taking differently: Take 100 mg by mouth every morning. ), Disp: 90 tablet, Rfl: 0 .  triamcinolone (KENALOG) 0.025 % cream, Apply 1 application topically 2 (two) times daily as needed (irritation). , Disp: , Rfl:  .  triamterene-hydrochlorothiazide (MAXZIDE-25) 37.5-25 MG tablet, Take 1 tablet by mouth daily., Disp: 90 tablet, Rfl: 1 No current facility-administered medications for this visit.   Facility-Administered Medications Ordered in Other Visits:  .  0.9 %  sodium chloride infusion, , Intravenous, Continuous, Corcoran, Melissa C, MD, Last Rate: 0 mL/hr at 05/08/17 1437 .  cyanocobalamin ((VITAMIN B-12)) injection 1,000 mcg, 1,000 mcg, Intramuscular, Once, Corcoran, Melissa C, MD  EXAM:  GENERAL: alert, oriented, appears well and in no acute distress  HEENT: atraumatic, conjunttiva clear, no obvious abnormalities on inspection of external nose and ears  NECK: normal movements of the head and neck  LUNGS: on inspection no signs of respiratory distress, breathing rate appears normal, no obvious gross SOB, gasping or wheezing  CV: no obvious cyanosis  PSYCH/NEURO: pleasant and  cooperative, no obvious depression or anxiety, speech and thought processing grossly intact  ASSESSMENT AND PLAN:  Discussed the following assessment and plan:  Anemia Follow cbc.   B12 deficiency Continue b12 injections.   Bilateral lower extremity edema Stable.  Follow.    Environmental allergies Stable.   Essential hypertension, benign Blood pressure under good control.  Continue same medication regimen.  Follow pressures.  Follow metabolic panel.    GERD (gastroesophageal reflux disease)  Controlled.    Gout On allopurinol.  Stable.   Hypercholesterolemia Low cholesterol diet and exercise.  Follow lipid panel.   Hyperglycemia Low carb diet and exercise.  Follow met b and a1c.    Hypothyroidism On thyroid replacement.  Follow tsh.   Mild depression ([place]) On zoloft.  Stable.    Psoriatic arthritis ([place]) Treatment on hold now secondary to her surgery. Being followed by dermatology.      I discussed the assessment and treatment plan with the patient. The patient was provided an opportunity to ask questions and all were answered. The patient agreed with the plan and demonstrated an understanding of the instructions.   The patient was advised to call back or seek an in-person evaluation if the symptoms worsen or if the condition fails to improve as anticipated.   Einar Pheasant, MD

## 2019-01-03 NOTE — Assessment & Plan Note (Signed)
Controlled.  

## 2019-01-03 NOTE — Assessment & Plan Note (Signed)
On thyroid replacement.  Follow tsh.  

## 2019-01-03 NOTE — Assessment & Plan Note (Signed)
On allopurinol.  Stable.

## 2019-01-03 NOTE — Assessment & Plan Note (Signed)
Stable

## 2019-01-03 NOTE — Assessment & Plan Note (Signed)
Low carb diet and exercise.  Follow met b and a1c.   

## 2019-01-03 NOTE — Assessment & Plan Note (Signed)
Treatment on hold now secondary to her surgery. Being followed by dermatology.

## 2019-01-03 NOTE — Assessment & Plan Note (Signed)
On zoloft.  Stable.  

## 2019-01-03 NOTE — Assessment & Plan Note (Signed)
Follow cbc.  

## 2019-01-03 NOTE — Assessment & Plan Note (Signed)
Blood pressure under good control.  Continue same medication regimen.  Follow pressures.  Follow metabolic panel.   

## 2019-01-03 NOTE — Assessment & Plan Note (Signed)
Low cholesterol diet and exercise.  Follow lipid panel.   

## 2019-01-03 NOTE — Assessment & Plan Note (Signed)
Continue b12 injections.  

## 2019-01-04 NOTE — Progress Notes (Signed)
Office: [phone]  /  Fax: [phone] TeleHealth Visit:  Lori Le has verbally consented to this TeleHealth visit today. The patient is located at home, the provider is located at the [employer] [facility] office. The participants in this visit include the listed provider and patient. The visit was conducted today via Webex.  HPI:   Chief Complaint: OBESITY Lori Le is here to discuss her progress with her obesity treatment plan. She is keeping a food journal with 1150-1300 calories and 80 grams of protein and is following her eating plan approximately 0% of the time. She states she is exercising 0 minutes 0 times per week. Lori Le had recent rotator cuff surgery, which she is still recovering from, and hasn't started PT yet. She feels she has lost weight (no weight to report). She has a minimal appetite - tolerating crackers, toast, ginger ale and water. She states she has not been journaling. We were unable to weigh the patient today for this TeleHealth visit. She feels as if she has lost weight since her last visit. She has lost 0 lbs since starting treatment with [name].  Psoriatic Arthritis Lori Le has a biologic infusion arriving tomorrow and needs EpiPen to have on hand for the infusion.  Pre-Diabetes Lori Le has a diagnosis of prediabetes based on her elevated Hgb A1c and was informed this puts her at greater risk of developing diabetes. She is taking metformin currently with no GI side effects. Lori Le is currently tolerating mostly carbs secondary to surgery and nausea postoperatively.  ASSESSMENT AND PLAN:  Prediabetes - Plan: metFORMIN (GLUCOPHAGE) 500 MG tablet  Psoriatic arthritis ([place]) - Plan: EPINEPHrine 0.3 mg/0.3 mL IJ SOAJ injection  Class 2 severe obesity with serious comorbidity and body mass index (BMI) of 35.0 to 35.9 in adult, unspecified obesity type ([place])  PLAN:  Psoriatic Arthritis Lori Le was given a refill on her EpiPen and will follow-up as  directed.  Pre-Diabetes Lori Le will continue to work on weight loss, exercise, and decreasing simple carbohydrates in her diet to help decrease the risk of diabetes. We dicussed metformin including benefits and risks. She was informed that eating too many simple carbohydrates or too many calories at one sitting increases the likelihood of GI side effects. Lori Le was given a refill on her metformin 500 mg PO daily #30 with 0 refills and agrees to follow-up with our clinic in 2 weeks.  Obesity Lori Le is currently in the action stage of change. As such, her goal is to continue with weight loss efforts. She has agreed to follow the Vegetarian/Pescatarian eating plan and will journal 1150-1300 calories + 80 grams of protein. Lori Le has been instructed to work up to a goal of 150 minutes of combined cardio and strengthening exercise per week for weight loss and overall health benefits. We discussed the following Behavioral Modification Strategies today: increasing lean protein intake, work on meal planning and easy cooking plans, keeping healthy foods in the home, and planning for success.  Lori Le has agreed to follow-up with our clinic in 2 weeks. She was informed of the importance of frequent follow-up visits to maximize her success with intensive lifestyle modifications for her multiple health conditions.  ALLERGIES: Allergies  Allergen Reactions   Contrast Media [Iodinated Diagnostic Agents] Anaphylaxis    MRI dye   Etodolac Anaphylaxis   Alpha-Gal     Flu- like symptoms   Influenza Vaccines Hives   Lactose Nausea And Vomiting    Can have yogurt Can not have milk and ice  cream    Lactose Intolerance (Gi) Nausea And Vomiting    Can have yogurt Can not have milk and ice cream     Bacitracin-Neomycin-Polymyxin Rash   Neomycin-Bacitracin Zn-Polymyx Rash   Neosporin + Pain Relief Max St [Neomy-Bacit-Polymyx-Pramoxine] Rash    MEDICATIONS: Current Outpatient Medications on File Prior to  Visit  Medication Sig Dispense Refill   clobetasol ointment (TEMOVATE) [id number] % Apply 1 application topically at bedtime as needed (inflammation).      cyanocobalamin (,VITAMIN B-12,) 1000 MCG/ML injection ADM 1 ML IM Q 30 DAYS 1 mL 5   furosemide (LASIX) 40 MG tablet Take 1 tablet (40 mg total) by mouth daily. 90 tablet 1   gabapentin (NEURONTIN) 300 MG capsule 300 mg 2 (two) times daily.      levothyroxine (SYNTHROID) 137 MCG tablet Take 137 mcg by mouth daily before breakfast.      montelukast (SINGULAIR) 10 MG tablet Take 10 mg by mouth at bedtime.     ondansetron (ZOFRAN ODT) 4 MG disintegrating tablet Take 1 tablet (4 mg total) by mouth every 8 (eight) hours as needed for nausea or vomiting. 20 tablet 1   oxyCODONE (ROXICODONE) 5 MG immediate release tablet Take 1-2 tablets (5-10 mg total) by mouth every 4 (four) hours as needed. 50 tablet 0   pantoprazole (PROTONIX) 40 MG tablet Take 40 mg by mouth every morning.      potassium chloride SA (K-DUR) 20 MEQ tablet Take two tablets (40 meq) by mouth daily in the morning and 1 tablet (20 meq) by mouth daily in the evening (Patient taking differently: Take 20 mEq by mouth 2 (two) times daily. Take two tablets (40 meq) by mouth daily in the morning and 1 tablet (20 meq) by mouth daily in the evening) 90 tablet 3   sertraline (ZOLOFT) 100 MG tablet Take 1 tablet (100 mg total) by mouth daily. (Patient taking differently: Take 100 mg by mouth every morning. ) 90 tablet 0   triamcinolone (KENALOG) 0.025 % cream Apply 1 application topically 2 (two) times daily as needed (irritation).      triamterene-hydrochlorothiazide (MAXZIDE-25) 37.5-25 MG tablet Take 1 tablet by mouth daily. 90 tablet 1   Current Facility-Administered Medications on File Prior to Visit  Medication Dose Route Frequency Provider Last Rate Last Dose   0.9 %  sodium chloride infusion   Intravenous Continuous Lequita Asal, MD 0 mL/hr at 05/08/17 1437      cyanocobalamin ((VITAMIN B-12)) injection 1,000 mcg  1,000 mcg Intramuscular Once Lequita Asal, MD        PAST MEDICAL HISTORY: Past Medical History:  Diagnosis Date   Allergic rhinitis due to allergen    Allergy    Anemia    Anxiety    Arthritis    B12 deficiency    Cancer (HCC)    skin ca-melanoma   Chronic idiopathic urticaria    Complication of anesthesia    nausea, slow to wake up   Depression    Edema    Family history of adverse reaction to anesthesia    Father - PONV   Gastric ulcer    GERD (gastroesophageal reflux disease)    Gout    Gout    Hashimoto's thyroiditis    Heart murmur    History of eating disorder    History of kidney stones    h/o years ago   Hives    Hyperlipidemia    Hypertension    Hypothyroidism  Lower extremity edema    Melanoma in situ ([place])    left shoulder   Migraine    approx 4x/yr-migraines   Mitral valve disorder    Motion sickness    all moving vehicles   Neuropathy    fingers and feet   PCOS (polycystic ovarian syndrome)    PONV (postoperative nausea and vomiting)    Psoriatic arthritis (HCC)    Thyroid disease    Ulcer    Vertigo    last episode over 1 yr ago    PAST SURGICAL HISTORY: Past Surgical History:  Procedure Laterality Date   BONE EXCISION Left 09/03/2018   Procedure: PART EXCISION [place] 2,3,4 LEFT;  Surgeon: Albertine Patricia, DPM;  Location: [place];  Service: Podiatry;  Laterality: Left;   BREAST BIOPSY Right 2007   neg- bx/clip   CHOLECYSTECTOMY  2003   COLONOSCOPY WITH PROPOFOL N/A 05/11/2018   Procedure: COLONOSCOPY WITH PROPOFOL;  Surgeon: [name], MD;  Location: [facility] ENDOSCOPY;  Service: Endoscopy;  Laterality: N/A;   DILITATION & CURRETTAGE/HYSTROSCOPY WITH NOVASURE ABLATION N/A 08/19/2016   Procedure: DILATATION & CURETTAGE/HYSTEROSCOPY WITH NOVASURE ABLATION;  Surgeon: [name], MD;  Location: [facility];  Service:  Gynecology;  Laterality: N/A;   ESOPHAGOGASTRODUODENOSCOPY (EGD) WITH PROPOFOL N/A 12/05/2015   Procedure: ESOPHAGOGASTRODUODENOSCOPY (EGD) WITH PROPOFOL;  Surgeon: Lollie Sails, MD;  Location: [facility] ENDOSCOPY;  Service: Endoscopy;  Laterality: N/A;   ESOPHAGOGASTRODUODENOSCOPY (EGD) WITH PROPOFOL N/A 02/06/2016   Procedure: ESOPHAGOGASTRODUODENOSCOPY (EGD) WITH PROPOFOL;  Surgeon: Lollie Sails, MD;  Location: [facility] ENDOSCOPY;  Service: Endoscopy;  Laterality: N/A;   ESOPHAGOGASTRODUODENOSCOPY (EGD) WITH PROPOFOL N/A 05/11/2018   Procedure: ESOPHAGOGASTRODUODENOSCOPY (EGD) WITH PROPOFOL;  Surgeon: [name], MD;  Location: [facility] ENDOSCOPY;  Service: Endoscopy;  Laterality: N/A;   ESOPHAGOGASTRODUODENOSCOPY (EGD) WITH PROPOFOL N/A 09/08/2018   Procedure: ESOPHAGOGASTRODUODENOSCOPY (EGD) WITH BIOPSY;  Surgeon: [name], MD;  Location: [place];  Service: Endoscopy;  Laterality: N/A;   FOOT ARTHRODESIS Left 09/03/2018   Procedure: ARTHRODESIS; HALLUX/IP JOINT LEFT;  Surgeon: Albertine Patricia, DPM;  Location: [place];  Service: Podiatry;  Laterality: Left;  [place]   GASTRIC BYPASS  2011   JOINT REPLACEMENT Left 09/27/2014   HIP   JOINT REPLACEMENT Left 12/01/2014   KNEE   KNEE ARTHROSCOPY Right    KNEE ARTHROSCOPY Left 03/23/2015   Procedure: ARTHROSCOPY KNEE, partial synovectomy;  Surgeon: Hessie Knows, MD;  Location: [facility];  Service: Orthopedics;  Laterality: Left;   LAPAROSCOPIC GASTRIC BANDING  2010   POLYPECTOMY N/A 09/08/2018   Procedure: POLYPECTOMY;  Surgeon: [name], MD;  Location: [place];  Service: Endoscopy;  Laterality: N/A;  Stomach   ROTATOR CUFF REPAIR Bilateral    right shoulder 07-26-2016   SHOULDER ARTHROSCOPY Left 06/20/2015   Procedure: ARTHROSCOPY SHOULDER, REPAIR OF MASSIVE ROTATOR CUFF TEAR, TENODESIS, DECOMPRESSION DEBRIDEMENT;  Surgeon: Corky Mull, MD;   Location: [facility];  Service: Orthopedics;  Laterality: Left;   SHOULDER ARTHROSCOPY WITH OPEN ROTATOR CUFF REPAIR Right 12/22/2018   Procedure: SHOULDER ARTHROSCOPY WITH OPEN ROTATOR CUFF REPAIR;  Surgeon: Corky Mull, MD;  Location: [facility];  Service: Orthopedics;  Laterality: Right;   TOTAL HIP ARTHROPLASTY Left 09/27/2014   Procedure: TOTAL HIP ARTHROPLASTY ANTERIOR APPROACH;  Surgeon: Hessie Knows, MD;  Location: [facility];  Service: Orthopedics;  Laterality: Left;   TOTAL KNEE ARTHROPLASTY Left 12/01/2014   Procedure: TOTAL KNEE ARTHROPLASTY;  Surgeon: Legrand Como  Rudene Christians, MD;  Location: [facility];  Service: Orthopedics;  Laterality: Left;    SOCIAL HISTORY: Social History   Tobacco Use   Smoking status: Never Smoker   Smokeless tobacco: Never Used  Substance Use Topics   Alcohol use: Yes    Alcohol/week: 0.0 - 1.0 standard drinks    Comment: occ.   Drug use: No    FAMILY HISTORY: Family History  Problem Relation Age of Onset   Arthritis Mother    Hyperlipidemia Mother    Hypertension Mother    Diabetes Mother    Sleep apnea Mother    Obesity Mother    Arthritis Father    Hyperlipidemia Father    Hypertension Father    Mental illness Father    Diabetes Father    Obesity Father    Sleep apnea Father    Bipolar disorder Father    Anxiety disorder Father    Depression Father    Cancer Father    Kidney disease Father    Arthritis Maternal Grandmother    Cancer Maternal Grandmother        breast cancer   Hyperlipidemia Maternal Grandmother    Hypertension Maternal Grandmother    Breast cancer Maternal Grandmother    Arthritis Maternal Grandfather    Hyperlipidemia Maternal Grandfather    Hypertension Maternal Grandfather    Heart disease Maternal Grandfather        heart attack   Breast cancer Paternal Grandmother    ROS: Review of Systems  Gastrointestinal: Positive for nausea (postop).  Musculoskeletal:       Positive for  psoriatic arthritis.   PHYSICAL EXAM: Pt in no acute distress  RECENT LABS AND TESTS: BMET    Component Value Date/Time   NA 134 (L) 11/25/2018 1348   NA 139 09/01/2018 1527   K 3.8 12/15/2018 1001   K 4.0 09/27/2014   CL 96 (L) 11/25/2018 1348   CO2 28 11/25/2018 1348   GLUCOSE 98 11/25/2018 1348   BUN 14 11/25/2018 1348   BUN 16 09/01/2018 1527   CREATININE 0.61 11/25/2018 1348   CREATININE 0.81 09/12/2017 1529   CALCIUM 8.6 (L) 11/25/2018 1348   GFRNONAA >60 11/25/2018 1348   GFRAA >60 11/25/2018 1348   Lab Results  Component Value Date   HGBA1C 5.9 (H) 10/27/2018   HGBA1C 5.8 05/26/2018   HGBA1C 5.5 04/10/2017   HGBA1C 5.4 04/07/2015   Lab Results  Component Value Date   INSULIN 13.4 10/27/2018   INSULIN 12.2 06/17/2018   CBC    Component Value Date/Time   WBC 10.4 11/25/2018 1348   RBC 4.50 11/25/2018 1348   HGB 13.5 11/25/2018 1348   HGB 14.2 07/09/2018 1459   HCT 41.0 11/25/2018 1348   HCT 40.4 07/09/2018 1459   PLT 270 11/25/2018 1348   PLT 316 07/09/2018 1459   MCV 91.1 11/25/2018 1348   MCV 88 07/09/2018 1459   MCH 30.0 11/25/2018 1348   MCHC 32.9 11/25/2018 1348   RDW 13.7 11/25/2018 1348   RDW 13.0 07/09/2018 1459   LYMPHSABS 2.0 11/25/2018 1348   LYMPHSABS 2.6 07/09/2018 1459   MONOABS 0.7 11/25/2018 1348   EOSABS 0.1 11/25/2018 1348   EOSABS 0.1 07/09/2018 1459   BASOSABS 0.1 11/25/2018 1348   BASOSABS 0.1 07/09/2018 1459   Iron/TIBC/Ferritin/ %Sat    Component Value Date/Time   IRON 31 05/22/2018 1540   TIBC 373 05/22/2018 1540   FERRITIN 23 11/18/2018 1342   FERRITIN 42 04/07/2015 [id number]  IRONPCTSAT 8 (L) 05/22/2018 1540   Lipid Panel     Component Value Date/Time   CHOL 178 05/26/2018 0842   CHOL 202 (H) 04/10/2017 0821   TRIG 256.0 (H) 05/26/2018 0842   HDL 62.60 05/26/2018 0842   HDL 67 04/10/2017 0821   CHOLHDL 3 05/26/2018 0842   VLDL 51.2 (H) 05/26/2018 0842   LDLCALC 82 04/10/2017 0821   LDLDIRECT 106.0 05/26/2018  0842   Hepatic Function Panel     Component Value Date/Time   PROT 6.9 11/18/2018 1342   PROT 6.7 04/10/2017 0821   ALBUMIN 3.8 11/18/2018 1342   ALBUMIN 4.2 04/10/2017 0821   AST 12 (L) 11/18/2018 1342   ALT 15 11/18/2018 1342   ALKPHOS 48 11/18/2018 1342   BILITOT 0.4 11/18/2018 1342   BILITOT 0.3 04/10/2017 0821   BILIDIR 0.1 09/12/2017 1529   BILIDIR 0.09 04/10/2017 0821   IBILI 0.2 09/12/2017 1529      Component Value Date/Time   TSH 0.57 05/26/2018 0842   TSH 0.54 12/26/2016 0805   TSH 1.40 02/28/2015 1406   Results for IVIS, LAWWILL (MRN [id number]) as of 01/04/2019 14:43  Ref. Range 10/27/2018 09:40  Vitamin D, 25-Hydroxy Latest Ref Range: 30.0 - 100.0 ng/mL 24.5 (L)   I, Michaelene Song, am acting as Location manager for Ilene Qua, MD  I have reviewed the above documentation for accuracy and completeness, and I agree with the above. - Ilene Qua, MD

## 2019-01-16 ENCOUNTER — Other Ambulatory Visit: Payer: Self-pay

## 2019-01-16 ENCOUNTER — Encounter: Payer: Self-pay | Admitting: Emergency Medicine

## 2019-01-16 ENCOUNTER — Ambulatory Visit
Admission: EM | Admit: 2019-01-16 | Discharge: 2019-01-16 | Disposition: A | Discharge status: Home / Self Care | Payer: Managed Care, Other (non HMO) | Attending: Family Medicine | Admitting: Family Medicine

## 2019-01-16 DIAGNOSIS — N390 Urinary tract infection, site not specified: Secondary | ICD-10-CM | POA: Diagnosis not present

## 2019-01-16 LAB — URINALYSIS, COMPLETE (UACMP) WITH MICROSCOPIC
Glucose, UA: NEGATIVE mg/dL
Nitrite: NEGATIVE
Protein, ur: >300 mg/dL — AB
RBC / HPF: >50 RBC/hpf (ref 0–5)
Specific Gravity, Urine: 1.02 (ref 1.005–1.030)
pH: 6.5 (ref 5.0–8.0)

## 2019-01-16 MED ORDER — NITROFURANTOIN MONOHYD MACRO 100 MG PO CAPS: 100.0000 mg | ORAL_CAPSULE | Freq: Two times a day (BID) | ORAL | 0 refills | Status: DC

## 2019-01-16 NOTE — ED Triage Notes (Signed)
Pt c/o dysuria, hematuria, urgency. Started about a week ago. She had an e visit and started on keflex but does not seem to be getting better.

## 2019-01-16 NOTE — ED Provider Notes (Signed)
[facility]    CSN: [id number] Arrival date & time: 01/16/19  0850      History   Chief Complaint Chief Complaint  Patient presents with  . Dysuria    APPT  . Hematuria    HPI Lori Le is a 53 y.o. female.   53 yo female with a c/o burning with urination, urgency and blood in the urine for the past week not improved with keflex prescribed through telemedicine visit. Denies any abdominal pain, fevers, chills, back pain, vomiting.    Dysuria Hematuria    Past Medical History:  Diagnosis Date  . Allergic rhinitis due to allergen   . Allergy   . Anemia   . Anxiety   . Arthritis   . B12 deficiency   . Cancer ([place])    skin ca-melanoma  . Chronic idiopathic urticaria   . Complication of anesthesia    nausea, slow to wake up  . Depression   . Edema   . Family history of adverse reaction to anesthesia    Father - PONV  . Gastric ulcer   . GERD (gastroesophageal reflux disease)   . Gout   . Gout   . Hashimoto's thyroiditis   . Heart murmur   . History of eating disorder   . History of kidney stones    h/o years ago  . Hives   . Hyperlipidemia   . Hypertension   . Hypothyroidism   . Lower extremity edema   . Melanoma in situ ([place])    left shoulder  . Migraine    approx 4x/yr-migraines  . Mitral valve disorder   . Motion sickness    all moving vehicles  . Neuropathy    fingers and feet  . PCOS (polycystic ovarian syndrome)   . PONV (postoperative nausea and vomiting)   . Psoriatic arthritis ([place])   . Thyroid disease   . Ulcer   . Vertigo    last episode over 1 yr ago    Patient Active Problem List   Diagnosis Date Noted  . Psoriatic arthritis ([place]) 01/03/2019  . Rash 10/25/2018  . Gastric polyp   . Acute gastrojejunal ulcer without hemorrhage or perforation   . Toe pain 08/30/2018  . Hypokalemia 08/30/2018  . Constipation 08/30/2018  . UTI (urinary tract infection) 08/30/2018  . History of colon polyps 05/13/2018  . Health  care maintenance   . Benign neoplasm of cecum   . Benign neoplasm of transverse colon   . CLE (columnar lined esophagus)   . Gastrointestinal ulcer   . Hot flashes 01/12/2018  . Chronic venous insufficiency 12/18/2017  . Lymphedema 12/18/2017  . Iron deficiency anemia following bariatric surgery 10/28/2017  . Bilateral lower extremity edema 10/07/2017  . Hyperglycemia 03/23/2017  . Rotator cuff syndrome 04/24/2015  . Arthritis of knee, degenerative 04/24/2015  . Degenerative arthritis of hip 04/24/2015  . B12 deficiency 01/06/2015  . Primary osteoarthritis of knee 12/01/2014  . Anemia 11/22/2014  . Primary localized osteoarthritis of left hip 09/27/2014  . Gout 03/20/2014  . Excessive and frequent menstruation 08/10/2013  . Derangement of posterior horn of medial meniscus 08/10/2013  . Mitral valve disease 08/10/2013  . Edema 12/30/2012  . Essential hypertension, benign 08/02/2012  . Melanoma in situ ([place]) 08/02/2012  . Mild depression ([place]) 08/02/2012  . Environmental allergies 08/02/2012  . Hypercholesterolemia 08/02/2012  . Migraines 08/02/2012  . Hypothyroidism 08/02/2012  . PCOS (polycystic ovarian syndrome) 08/02/2012  . GERD (gastroesophageal reflux disease)  08/02/2012  . Intestinal bypass or anastomosis status 08/02/2012  . Food allergy 07/19/2011    Past Surgical History:  Procedure Laterality Date  . BONE EXCISION Left 09/03/2018   Procedure: PART EXCISION BONE-PHALANX 2,3,4 LEFT;  Surgeon: Albertine Patricia, DPM;  Location: [place];  Service: Podiatry;  Laterality: Left;  . BREAST BIOPSY Right 2007   neg- bx/clip  . CHOLECYSTECTOMY  2003  . COLONOSCOPY WITH PROPOFOL N/A 05/11/2018   Procedure: COLONOSCOPY WITH PROPOFOL;  Surgeon: [name], MD;  Location: [facility];  Service: Endoscopy;  Laterality: N/A;  . DILITATION & CURRETTAGE/HYSTROSCOPY WITH NOVASURE ABLATION N/A 08/19/2016   Procedure: DILATATION & CURETTAGE/HYSTEROSCOPY WITH  NOVASURE ABLATION;  Surgeon: [name], MD;  Location: [facility];  Service: Gynecology;  Laterality: N/A;  . ESOPHAGOGASTRODUODENOSCOPY (EGD) WITH PROPOFOL N/A 12/05/2015   Procedure: ESOPHAGOGASTRODUODENOSCOPY (EGD) WITH PROPOFOL;  Surgeon: Lollie Sails, MD;  Location: [facility];  Service: Endoscopy;  Laterality: N/A;  . ESOPHAGOGASTRODUODENOSCOPY (EGD) WITH PROPOFOL N/A 02/06/2016   Procedure: ESOPHAGOGASTRODUODENOSCOPY (EGD) WITH PROPOFOL;  Surgeon: Lollie Sails, MD;  Location: [facility]COPY;  Service: Endoscopy;  Laterality: N/A;  . ESOPHAGOGASTRODUODENOSCOPY (EGD) WITH PROPOFOL N/A 05/11/2018   Procedure: ESOPHAGOGASTRODUODENOSCOPY (EGD) WITH PROPOFOL;  Surgeon: [name], MD;  Location: [facility];  Service: Endoscopy;  Laterality: N/A;  . ESOPHAGOGASTRODUODENOSCOPY (EGD) WITH PROPOFOL N/A 09/08/2018   Procedure: ESOPHAGOGASTRODUODENOSCOPY (EGD) WITH BIOPSY;  Surgeon: [name], MD;  Location: [place];  Service: Endoscopy;  Laterality: N/A;  . FOOT ARTHRODESIS Left 09/03/2018   Procedure: ARTHRODESIS; HALLUX/IP JOINT LEFT;  Surgeon: Albertine Patricia, DPM;  Location: [place];  Service: Podiatry;  Laterality: Left;  LMA LOCAL  . FOOT SURGERY  1994  . GASTRIC BYPASS  2011  . JOINT REPLACEMENT Left 09/27/2014   HIP  . JOINT REPLACEMENT Left 12/01/2014   KNEE  . KNEE ARTHROSCOPY Right   . KNEE ARTHROSCOPY Left 03/23/2015   Procedure: ARTHROSCOPY KNEE, partial synovectomy;  Surgeon: Hessie Knows, MD;  Location: [facility];  Service: Orthopedics;  Laterality: Left;  . LAPAROSCOPIC GASTRIC BANDING  2010  . POLYPECTOMY N/A 09/08/2018   Procedure: POLYPECTOMY;  Surgeon: [name], MD;  Location: [place];  Service: Endoscopy;  Laterality: N/A;  Stomach  . ROTATOR CUFF REPAIR Bilateral    right shoulder 07-26-2016  . SHOULDER ARTHROSCOPY Left 06/20/2015   Procedure: ARTHROSCOPY SHOULDER, REPAIR OF MASSIVE ROTATOR CUFF  TEAR, TENODESIS, DECOMPRESSION DEBRIDEMENT;  Surgeon: Corky Mull, MD;  Location: [facility];  Service: Orthopedics;  Laterality: Left;  . SHOULDER ARTHROSCOPY WITH OPEN ROTATOR CUFF REPAIR Right 12/22/2018   Procedure: SHOULDER ARTHROSCOPY WITH OPEN ROTATOR CUFF REPAIR;  Surgeon: Corky Mull, MD;  Location: [facility];  Service: Orthopedics;  Laterality: Right;  . TOTAL HIP ARTHROPLASTY Left 09/27/2014   Procedure: TOTAL HIP ARTHROPLASTY ANTERIOR APPROACH;  Surgeon: Hessie Knows, MD;  Location: [facility];  Service: Orthopedics;  Laterality: Left;  . TOTAL KNEE ARTHROPLASTY Left 12/01/2014   Procedure: TOTAL KNEE ARTHROPLASTY;  Surgeon: Hessie Knows, MD;  Location: [facility];  Service: Orthopedics;  Laterality: Left;    OB History    Gravida  0   Para  0   Term  0   Preterm  0   AB  0   Living  0     SAB  0   TAB  0   Ectopic  0   Multiple  0   Live Births  0  Home Medications    Prior to Admission medications   Medication Sig Start Date End Date Taking? Authorizing Provider  clobetasol ointment (TEMOVATE) [id number] % Apply 1 application topically at bedtime as needed (inflammation).  10/22/18  Yes [provider]  EPINEPHrine 0.3 mg/0.3 mL IJ SOAJ injection Inject 0.3 mLs (0.3 mg total) into the muscle as needed for anaphylaxis. 12/31/18  Yes Eber Jones, MD  furosemide (LASIX) 40 MG tablet Take 1 tablet (40 mg total) by mouth daily. 10/22/18  Yes Einar Pheasant, MD  gabapentin (NEURONTIN) 300 MG capsule 300 mg 2 (two) times daily.  10/21/18  Yes [provider]  levothyroxine (SYNTHROID) 137 MCG tablet Take 137 mcg by mouth daily before breakfast.  06/19/18  Yes [provider]  montelukast (SINGULAIR) 10 MG tablet Take 10 mg by mouth at bedtime.   Yes [provider]  ondansetron (ZOFRAN ODT) 4 MG disintegrating tablet Take 1 tablet (4 mg total) by mouth every 8 (eight) hours as needed for nausea or vomiting. 12/22/18  Yes  Poggi, Marshall Cork, MD  oxyCODONE (ROXICODONE) 5 MG immediate release tablet Take 1-2 tablets (5-10 mg total) by mouth every 4 (four) hours as needed. 12/22/18  Yes Poggi, Marshall Cork, MD  pantoprazole (PROTONIX) 40 MG tablet Take 40 mg by mouth every morning.    Yes [provider]  potassium chloride SA (K-DUR) 20 MEQ tablet Take two tablets (40 meq) by mouth daily in the morning and 1 tablet (20 meq) by mouth daily in the evening Patient taking differently: Take 20 mEq by mouth 2 (two) times daily. Take two tablets (40 meq) by mouth daily in the morning and 1 tablet (20 meq) by mouth daily in the evening 11/26/18  Yes Einar Pheasant, MD  sertraline (ZOLOFT) 100 MG tablet Take 1 tablet (100 mg total) by mouth daily. Patient taking differently: Take 100 mg by mouth every morning.  11/18/18 11/18/19 Yes Arfeen, Arlyce Harman, MD  COSENTYX SENSOREADY, 300 MG, 150 MG/ML SOAJ  12/28/18   [provider]  cyanocobalamin (,VITAMIN B-12,) 1000 MCG/ML injection ADM 1 ML IM Q 30 DAYS 12/08/18   Lequita Asal, MD  metFORMIN (GLUCOPHAGE) 500 MG tablet Take 1 tablet (500 mg total) by mouth daily with breakfast. 12/31/18 03/31/19  Eber Jones, MD  nitrofurantoin, macrocrystal-monohydrate, (MACROBID) 100 MG capsule Take 1 capsule (100 mg total) by mouth 2 (two) times daily. 01/16/19   Norval Gable, MD  triamcinolone (KENALOG) 0.025 % cream Apply 1 application topically 2 (two) times daily as needed (irritation).  08/17/18   [provider]  triamterene-hydrochlorothiazide (MAXZIDE-25) 37.5-25 MG tablet Take 1 tablet by mouth daily. 10/22/18   Einar Pheasant, MD    Family History Family History  Problem Relation Age of Onset  . Arthritis Mother   . Hyperlipidemia Mother   . Hypertension Mother   . Diabetes Mother   . Sleep apnea Mother   . Obesity Mother   . Arthritis Father   . Hyperlipidemia Father   . Hypertension Father   . Mental illness Father   . Diabetes Father   . Obesity  Father   . Sleep apnea Father   . Bipolar disorder Father   . Anxiety disorder Father   . Depression Father   . Cancer Father   . Kidney disease Father   . Arthritis Maternal Grandmother   . Cancer Maternal Grandmother        breast cancer  . Hyperlipidemia Maternal Grandmother   .  Hypertension Maternal Grandmother   . Breast cancer Maternal Grandmother   . Arthritis Maternal Grandfather   . Hyperlipidemia Maternal Grandfather   . Hypertension Maternal Grandfather   . Heart disease Maternal Grandfather        heart attack  . Breast cancer Paternal Grandmother     Social History Social History   Tobacco Use  . Smoking status: Never Smoker  . Smokeless tobacco: Never Used  Substance Use Topics  . Alcohol use: Yes    Alcohol/week: 0.0 - 1.0 standard drinks    Comment: occ.  . Drug use: No     Allergies   Contrast media [iodinated diagnostic agents], Etodolac, Alpha-gal, Influenza vaccines, Lactose, Lactose intolerance (gi), Bacitracin-neomycin-polymyxin, Neomycin-bacitracin zn-polymyx, and Neosporin + pain relief max st [neomy-bacit-polymyx-pramoxine]   Review of Systems Review of Systems  Genitourinary: Positive for dysuria and hematuria.     Physical Exam Triage Vital Signs ED Triage Vitals  Enc Vitals Group     BP 01/16/19 0903 125/70     Pulse Rate 01/16/19 0903 72     Resp 01/16/19 0903 18     Temp 01/16/19 0903 98.4 F (36.9 C)     Temp Source 01/16/19 0903 Oral     SpO2 01/16/19 0903 99 %     Weight 01/16/19 0901 235 lb (106.6 kg)     Height 01/16/19 0901 5\' 7"  (1.702 m)     Head Circumference --      Peak Flow --      Pain Score 01/16/19 0900 6     Pain Loc --      Pain Edu? --      Excl. in [place]? --    No data found.  Updated Vital Signs BP 125/70 (BP Location: Left Arm)   Pulse 72   Temp 98.4 F (36.9 C) (Oral)   Resp 18   Ht 5\' 7"  (1.702 m)   Wt 106.6 kg   SpO2 99%   BMI 36.81 kg/m   Visual Acuity Right Eye Distance:   Left Eye  Distance:   Bilateral Distance:    Right Eye Near:   Left Eye Near:    Bilateral Near:     Physical Exam Vitals signs and nursing note reviewed.  Constitutional:      General: She is not in acute distress.    Appearance: She is not toxic-appearing or diaphoretic.  Cardiovascular:     Rate and Rhythm: Normal rate.  Pulmonary:     Effort: Pulmonary effort is normal. No respiratory distress.  Abdominal:     General: There is no distension.  Neurological:     Mental Status: She is alert.      UC Treatments / Results  Labs (all labs ordered are listed, but only abnormal results are displayed) Labs Reviewed  URINALYSIS, COMPLETE (UACMP) WITH MICROSCOPIC - Abnormal; Notable for the following components:      Result Value   APPearance CLOUDY (*)    Hgb urine dipstick LARGE (*)    Bilirubin Urine SMALL (*)    Ketones, ur TRACE (*)    Protein, ur >300 (*)    Leukocytes,Ua MODERATE (*)    Bacteria, UA FEW (*)    All other components within normal limits  URINE CULTURE    EKG   Radiology No results found.  Procedures Procedures (including critical care time)  Medications Ordered in UC Medications - No data to display  Initial Impression / Assessment and Plan / UC Course  I have reviewed the triage vital signs and the nursing notes.  Pertinent labs & imaging results that were available during my care of the patient were reviewed by me and considered in my medical decision making (see chart for details).      Final Clinical Impressions(s) / UC Diagnoses   Final diagnoses:  Lower urinary tract infectious disease     Discharge Instructions     Continue drinking lots of water    ED Prescriptions    Medication Sig Dispense Auth. Provider   nitrofurantoin, macrocrystal-monohydrate, (MACROBID) 100 MG capsule Take 1 capsule (100 mg total) by mouth 2 (two) times daily. 14 capsule Norval Gable, MD      1. Lab results and diagnosis reviewed with patient 2.  rx as per orders above; reviewed possible side effects, interactions, risks and benefits  3. Recommend supportive treatment with increased fluids 4. Check urine culture 5. Follow-up prn if symptoms worsen or don't improve  PDMP not reviewed this encounter.   Norval Gable, MD 01/16/19 [phone]

## 2019-01-16 NOTE — Discharge Instructions (Addendum)
Continue drinking lots of water

## 2019-01-19 ENCOUNTER — Telehealth: Payer: Self-pay

## 2019-01-19 ENCOUNTER — Ambulatory Visit (HOSPITAL_COMMUNITY): Payer: 59 | Admitting: Psychiatry

## 2019-01-19 LAB — URINE CULTURE
Culture: >=100000 — AB
Special Requests: NORMAL

## 2019-01-19 MED ORDER — SULFAMETHOXAZOLE-TRIMETHOPRIM 800-160 MG PO TABS: 1.0000 | ORAL_TABLET | Freq: Two times a day (BID) | ORAL | 0 refills | Status: DC

## 2019-01-19 NOTE — Telephone Encounter (Signed)
Patient called in for lab results. Currently on Macrobid and not seeing any improvement. Urine grew out Proteus Penneri and is resistant to Macrobid and Keflex that she was previously on. Spoke with Dr. Zenda Alpers. Advised to stop Macrobid and start Bactrim BID x 7 days. I have sent medication in to pharmacy and patient is advised. [facility]

## 2019-01-21 ENCOUNTER — Ambulatory Visit (INDEPENDENT_AMBULATORY_CARE_PROVIDER_SITE_OTHER): Payer: 59 | Admitting: Psychiatry

## 2019-01-21 ENCOUNTER — Other Ambulatory Visit: Payer: Self-pay

## 2019-01-21 ENCOUNTER — Encounter (HOSPITAL_COMMUNITY): Payer: Self-pay | Admitting: Psychiatry

## 2019-01-21 DIAGNOSIS — F330 Major depressive disorder, recurrent, mild: Secondary | ICD-10-CM | POA: Diagnosis not present

## 2019-01-21 DIAGNOSIS — F411 Generalized anxiety disorder: Secondary | ICD-10-CM | POA: Diagnosis not present

## 2019-01-21 MED ORDER — SERTRALINE HCL 100 MG PO TABS: 100.0000 mg | ORAL_TABLET | ORAL | 0 refills | Status: DC
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
## 2019-01-25 ENCOUNTER — Other Ambulatory Visit: Payer: Self-pay

## 2019-01-26 ENCOUNTER — Ambulatory Visit (INDEPENDENT_AMBULATORY_CARE_PROVIDER_SITE_OTHER): Payer: Managed Care, Other (non HMO) | Admitting: Gastroenterology

## 2019-01-26 ENCOUNTER — Other Ambulatory Visit: Payer: Self-pay

## 2019-01-26 ENCOUNTER — Encounter: Payer: Self-pay | Admitting: Gastroenterology

## 2019-01-26 VITALS — BP 119/64 | HR 86 | Temp 98.1°F | Wt 244.0 lb

## 2019-01-26 DIAGNOSIS — K283 Acute gastrojejunal ulcer without hemorrhage or perforation: Secondary | ICD-10-CM

## 2019-01-26 DIAGNOSIS — D509 Iron deficiency anemia, unspecified: Secondary | ICD-10-CM

## 2019-01-26 DIAGNOSIS — K219 Gastro-esophageal reflux disease without esophagitis: Secondary | ICD-10-CM | POA: Diagnosis not present

## 2019-01-26 DIAGNOSIS — K9589 Other complications of other bariatric procedure: Secondary | ICD-10-CM

## 2019-01-26 NOTE — Progress Notes (Signed)
Vonda Antigua, MD [address]  [place]  [place], [place] [address]  Main: [phone]  Fax: [phone]   Primary Care Physician: Einar Pheasant, MD   Chief Complaint  Patient presents with  . Constipation    Patient is having no symptoms     HPI: Lori Le is a 53 y.o. female here for follow-up of iron deficiency anemia, history of gastric bypass.  Iron deficiency anemia improved as evidenced by normal hemoglobin in August 2020.  Patient otherwise asymptomatic. The patient denies abdominal or flank pain, anorexia, nausea or vomiting, dysphagia, heartburn, change in bowel habits or black or bloody stools or weight loss.  Was previously prescribed Linzess but states she never got it due to high co-pay.  However, reports 1 soft bowel movement daily since started Cosentyx for psoriatic arthritis  Is taking Protonix once daily at this time with no heartburn.  Was previously on Protonix due to ulcer seen at the gastrojejunal anastomosis site in February 2020  Repeat upper endoscopy in June 2020 showed that this had resolved and therefore Protonix was decreased to once daily  Impression:           - Salmon-colored mucosa suspicious for short-segment                        Barrett's esophagus. Biopsied.                       - A few gastric polyps. Biopsied.                       - Roux-en-Y gastrojejunostomy with gastrojejunal                        anastomosis characterized by healthy appearing mucosa                        and an intact appearance.                       - Normal examined jejunum.  Salmon-colored mucosa previously seen and biopsied but did not show intestinal metaplasia.   A. STOMACH POLYP X 1; COLD BIOPSY:  - GASTRIC XANTHOMA, 1 FRAGMENT.  - FUNDIC GLAND POLYP, 1 FRAGMENT.  - OXYNTIC GLAND HYPERPLASIA, 1 FRAGMENT.  - FEATURES CONSISTENT WITH PROTON PUMP INHIBITOR EFFECT.  - NEGATIVE FOR H. PYLORI, INTESTINAL METAPLASIA, DYSPLASIA, AND   MALIGNANCY.   B. GASTROESOPHAGEAL JUNCTION; COLD BIOPSY:  - SQUAMOCOLUMNAR MUCOSA WITH MILD CHRONIC INFLAMMATION.  - NEGATIVE FOR GOBLET CELLS, DYSPLASIA, AND MALIGNANCY.   Upper endoscopy February 2020 showed 1 tongue of salmon-colored mucosa at the GE junction.  Gastric bypass anatomy seen and ulceration seen at the gastrojejunal anastomosis.  GE junction biopsies showed reflux gastroesophagitis.  Stomach biopsies were negative for H. pylori.  Colonoscopy February 2020, with 1, 10 mm and 3 subcentimeter polyps removed.  Repeat recommended in 3 years with 2-day prep.  Pathology showed tubular adenoma.  Had an EGD in September 2017 obtained by Dr. Gustavo Lah for melena. 8.5 cm gastric pouch was reported. Erythema and edema were reported at the gastrojejunal anastomosis. EGD was repeated in November 2017 and reported healthy appearing anastomosis. Biopsies showed reactive gastropathy.  Current Outpatient Medications  Medication Sig Dispense Refill  . clobetasol ointment (TEMOVATE) [id number] % Apply 1 application topically at bedtime as needed (inflammation).     [name]  COSENTYX SENSOREADY, 300 MG, 150 MG/ML SOAJ     . cyanocobalamin (,VITAMIN B-12,) 1000 MCG/ML injection ADM 1 ML IM Q 30 DAYS 1 mL 5  . cyclobenzaprine (FLEXERIL) 5 MG tablet     . EPINEPHrine 0.3 mg/0.3 mL IJ SOAJ injection Inject 0.3 mLs (0.3 mg total) into the muscle as needed for anaphylaxis. 1 each 0  . furosemide (LASIX) 40 MG tablet Take 1 tablet (40 mg total) by mouth daily. 90 tablet 1  . gabapentin (NEURONTIN) 300 MG capsule 300 mg 2 (two) times daily.     [name] levocetirizine (XYZAL) 5 MG tablet     . levothyroxine (SYNTHROID) 137 MCG tablet SMARTSIG:1 Tablet(s) By Mouth Daily    . metFORMIN (GLUCOPHAGE) 500 MG tablet Take 1 tablet (500 mg total) by mouth daily with breakfast. 90 tablet 0  . ondansetron (ZOFRAN ODT) 4 MG disintegrating tablet Take 1 tablet (4 mg total) by mouth every 8 (eight) hours as needed for nausea or  vomiting. 20 tablet 1  . oxyCODONE (ROXICODONE) 5 MG immediate release tablet Take 1-2 tablets (5-10 mg total) by mouth every 4 (four) hours as needed. 50 tablet 0  . potassium chloride SA (K-DUR) 20 MEQ tablet Take two tablets (40 meq) by mouth daily in the morning and 1 tablet (20 meq) by mouth daily in the evening (Patient taking differently: Take 20 mEq by mouth 2 (two) times daily. Take two tablets (40 meq) by mouth daily in the morning and 1 tablet (20 meq) by mouth daily in the evening) 90 tablet 3  . sertraline (ZOLOFT) 100 MG tablet Take 1 tablet (100 mg total) by mouth every morning. 90 tablet 0  . triamcinolone (KENALOG) 0.025 % cream Apply 1 application topically 2 (two) times daily as needed (irritation).     . triamterene-hydrochlorothiazide (MAXZIDE-25) 37.5-25 MG tablet Take 1 tablet by mouth daily. 90 tablet 1  . montelukast (SINGULAIR) 10 MG tablet Take 10 mg by mouth at bedtime.    . nitrofurantoin, macrocrystal-monohydrate, (MACROBID) 100 MG capsule Take 1 capsule (100 mg total) by mouth 2 (two) times daily. (Patient not taking: Reported on 01/26/2019) 14 capsule 0   No current facility-administered medications for this visit.    Facility-Administered Medications Ordered in Other Visits  Medication Dose Route Frequency Provider Last Rate Last Dose  . 0.9 %  sodium chloride infusion   Intravenous Continuous Lequita Asal, MD 0 mL/hr at 05/08/17 1437    . cyanocobalamin ((VITAMIN B-12)) injection 1,000 mcg  1,000 mcg Intramuscular Once Lequita Asal, MD        Allergies as of 01/26/2019 - Review Complete 01/26/2019  Allergen Reaction Noted  . Contrast media [iodinated diagnostic agents] Anaphylaxis 05/24/2017  . Etodolac Anaphylaxis 02/05/2017  . Alpha-gal  08/27/2018  . Influenza vaccines Hives 12/09/2013  . Lactose Nausea And Vomiting 04/25/2015  . Lactose intolerance (gi) Nausea And Vomiting 09/27/2014  . Bacitracin-neomycin-polymyxin Rash 04/25/2015  .  Neomycin-bacitracin zn-polymyx Rash   . Neosporin + pain relief max st [neomy-bacit-polymyx-pramoxine] Rash 07/31/2012    ROS:  General: Negative for anorexia, weight loss, fever, chills, fatigue, weakness. ENT: Negative for hoarseness, difficulty swallowing , nasal congestion. CV: Negative for chest pain, angina, palpitations, dyspnea on exertion, peripheral edema.  Respiratory: Negative for dyspnea at rest, dyspnea on exertion, cough, sputum, wheezing.  GI: See history of present illness. GU:  Negative for dysuria, hematuria, urinary incontinence, urinary frequency, nocturnal urination.  Endo: Negative for unusual weight change.    Physical  Examination:   BP 119/64 (BP Location: Left Arm, Patient Position: Sitting, Cuff Size: Large)   Pulse 86   Temp 98.1 F (36.7 C) (Oral)   Wt 244 lb (110.7 kg)   BMI 38.22 kg/m   General: Well-nourished, well-developed in no acute distress.  Eyes: No icterus. Conjunctivae pink. Mouth: Oropharyngeal mucosa moist and pink , no lesions erythema or exudate. Neck: Supple, Trachea midline Abdomen: Bowel sounds are normal, nontender, nondistended, no hepatosplenomegaly or masses, no abdominal bruits or hernia , no rebound or guarding.   Extremities: No lower extremity edema. No clubbing or deformities. Neuro: Alert and oriented x 3.  Grossly intact. Skin: Warm and dry, no jaundice.   Psych: Alert and cooperative, normal mood and affect.   Labs: CMP     Component Value Date/Time   NA 134 (L) 11/25/2018 1348   NA 139 09/01/2018 1527   K 3.8 12/15/2018 1001   K 4.0 09/27/2014   CL 96 (L) 11/25/2018 1348   CO2 28 11/25/2018 1348   GLUCOSE 98 11/25/2018 1348   BUN 14 11/25/2018 1348   BUN 16 09/01/2018 1527   CREATININE 0.61 11/25/2018 1348   CREATININE 0.81 09/12/2017 1529   CALCIUM 8.6 (L) 11/25/2018 1348   PROT 6.9 11/18/2018 1342   PROT 6.7 04/10/2017 0821   ALBUMIN 3.8 11/18/2018 1342   ALBUMIN 4.2 04/10/2017 0821   AST 12 (L)  11/18/2018 1342   ALT 15 11/18/2018 1342   ALKPHOS 48 11/18/2018 1342   BILITOT 0.4 11/18/2018 1342   BILITOT 0.3 04/10/2017 0821   GFRNONAA >60 11/25/2018 1348   GFRAA >60 11/25/2018 1348   Lab Results  Component Value Date   WBC 10.4 11/25/2018   HGB 13.5 11/25/2018   HCT 41.0 11/25/2018   MCV 91.1 11/25/2018   PLT 270 11/25/2018    Imaging Studies: No results found.  Assessment and Plan:   Lori Le is a 53 y.o. y/o female with history of gastric bypass, iron deficiency anemia, improved, and constipation here for follow-up  Constipation resolved Discontinue Linzess patient is not taking it  No heartburn No ulcer seen on June 2020 EGD, discontinue Protonix If patient starts having heartburn she was advised to call us and she verbalized understanding Repeat EGD in 3 years from last 1 to reevaluate salmon-colored mucosa seen previously, but biopsies not consistent with goblet cells or intestinal metaplasia on February and June 2020 EGD  Patient educated extensively on acid reflux lifestyle modification, including buying a bed wedge, not eating 3 hrs before bedtime, diet modifications, and handout given for the same.     Dr Vonda Antigua

## 2019-01-26 NOTE — Patient Instructions (Signed)

## 2019-02-16 ENCOUNTER — Other Ambulatory Visit: Payer: Self-pay

## 2019-02-17 ENCOUNTER — Inpatient Hospital Stay: Payer: Managed Care, Other (non HMO) | Attending: Hematology and Oncology

## 2019-02-17 DIAGNOSIS — D509 Iron deficiency anemia, unspecified: Secondary | ICD-10-CM

## 2019-02-17 DIAGNOSIS — K9589 Other complications of other bariatric procedure: Secondary | ICD-10-CM

## 2019-02-17 LAB — COMPREHENSIVE METABOLIC PANEL
ALT: 15 U/L (ref 0–44)
AST: 10 U/L — ABNORMAL LOW (ref 15–41)
Albumin: 4.1 g/dL (ref 3.5–5.0)
Alkaline Phosphatase: 53 U/L (ref 38–126)
Anion gap: 11 (ref 5–15)
BUN: 23 mg/dL — ABNORMAL HIGH (ref 6–20)
CO2: 29 mmol/L (ref 22–32)
Calcium: 8.9 mg/dL (ref 8.9–10.3)
Chloride: 97 mmol/L — ABNORMAL LOW (ref 98–111)
Creatinine, Ser: 1.03 mg/dL — ABNORMAL HIGH (ref 0.44–1.00)
GFR calc Af Amer: >60 mL/min (ref >60)
GFR calc non Af Amer: >60 mL/min (ref >60)
Glucose, Bld: 103 mg/dL — ABNORMAL HIGH (ref 70–99)
Potassium: 3.6 mmol/L (ref 3.5–5.1)
Sodium: 137 mmol/L (ref 135–145)
Total Bilirubin: 0.2 mg/dL — ABNORMAL LOW (ref 0.3–1.2)
Total Protein: 7.3 g/dL (ref 6.5–8.1)

## 2019-02-17 LAB — FERRITIN: Ferritin: 86 ng/mL (ref 11–307)

## 2019-03-03 DIAGNOSIS — E878 Other disorders of electrolyte and fluid balance, not elsewhere classified: Secondary | ICD-10-CM | POA: Insufficient documentation

## 2019-03-03 DIAGNOSIS — R600 Localized edema: Secondary | ICD-10-CM | POA: Insufficient documentation

## 2019-03-16 ENCOUNTER — Other Ambulatory Visit: Payer: Self-pay | Admitting: Surgery

## 2019-03-16 DIAGNOSIS — M75121 Complete rotator cuff tear or rupture of right shoulder, not specified as traumatic: Secondary | ICD-10-CM

## 2019-03-16 DIAGNOSIS — M7521 Bicipital tendinitis, right shoulder: Secondary | ICD-10-CM

## 2019-03-16 DIAGNOSIS — M24111 Other articular cartilage disorders, right shoulder: Secondary | ICD-10-CM

## 2019-03-27 ENCOUNTER — Encounter: Payer: Self-pay | Admitting: Emergency Medicine

## 2019-03-27 ENCOUNTER — Ambulatory Visit
Admission: EM | Admit: 2019-03-27 | Discharge: 2019-03-27 | Disposition: A | Discharge status: Home / Self Care | Payer: Managed Care, Other (non HMO) | Attending: Internal Medicine | Admitting: Internal Medicine

## 2019-03-27 ENCOUNTER — Other Ambulatory Visit: Payer: Self-pay

## 2019-03-27 DIAGNOSIS — E039 Hypothyroidism, unspecified: Secondary | ICD-10-CM | POA: Diagnosis not present

## 2019-03-27 DIAGNOSIS — F329 Major depressive disorder, single episode, unspecified: Secondary | ICD-10-CM | POA: Insufficient documentation

## 2019-03-27 DIAGNOSIS — I10 Essential (primary) hypertension: Secondary | ICD-10-CM | POA: Diagnosis not present

## 2019-03-27 DIAGNOSIS — R739 Hyperglycemia, unspecified: Secondary | ICD-10-CM | POA: Diagnosis not present

## 2019-03-27 DIAGNOSIS — Z833 Family history of diabetes mellitus: Secondary | ICD-10-CM | POA: Diagnosis not present

## 2019-03-27 DIAGNOSIS — J029 Acute pharyngitis, unspecified: Secondary | ICD-10-CM | POA: Diagnosis not present

## 2019-03-27 DIAGNOSIS — Z818 Family history of other mental and behavioral disorders: Secondary | ICD-10-CM | POA: Insufficient documentation

## 2019-03-27 DIAGNOSIS — Z85820 Personal history of malignant melanoma of skin: Secondary | ICD-10-CM | POA: Insufficient documentation

## 2019-03-27 DIAGNOSIS — G629 Polyneuropathy, unspecified: Secondary | ICD-10-CM | POA: Diagnosis not present

## 2019-03-27 DIAGNOSIS — Z79899 Other long term (current) drug therapy: Secondary | ICD-10-CM | POA: Insufficient documentation

## 2019-03-27 DIAGNOSIS — I059 Rheumatic mitral valve disease, unspecified: Secondary | ICD-10-CM | POA: Insufficient documentation

## 2019-03-27 DIAGNOSIS — Z79890 Hormone replacement therapy (postmenopausal): Secondary | ICD-10-CM | POA: Insufficient documentation

## 2019-03-27 DIAGNOSIS — Z7984 Long term (current) use of oral hypoglycemic drugs: Secondary | ICD-10-CM | POA: Diagnosis not present

## 2019-03-27 DIAGNOSIS — Z96642 Presence of left artificial hip joint: Secondary | ICD-10-CM | POA: Diagnosis not present

## 2019-03-27 DIAGNOSIS — M1710 Unilateral primary osteoarthritis, unspecified knee: Secondary | ICD-10-CM | POA: Insufficient documentation

## 2019-03-27 DIAGNOSIS — E876 Hypokalemia: Secondary | ICD-10-CM | POA: Diagnosis not present

## 2019-03-27 DIAGNOSIS — Z96652 Presence of left artificial knee joint: Secondary | ICD-10-CM | POA: Diagnosis not present

## 2019-03-27 DIAGNOSIS — Z9049 Acquired absence of other specified parts of digestive tract: Secondary | ICD-10-CM | POA: Diagnosis not present

## 2019-03-27 DIAGNOSIS — Z8261 Family history of arthritis: Secondary | ICD-10-CM | POA: Insufficient documentation

## 2019-03-27 DIAGNOSIS — Z881 Allergy status to other antibiotic agents status: Secondary | ICD-10-CM | POA: Insufficient documentation

## 2019-03-27 DIAGNOSIS — E739 Lactose intolerance, unspecified: Secondary | ICD-10-CM | POA: Insufficient documentation

## 2019-03-27 DIAGNOSIS — Z91041 Radiographic dye allergy status: Secondary | ICD-10-CM | POA: Insufficient documentation

## 2019-03-27 DIAGNOSIS — E538 Deficiency of other specified B group vitamins: Secondary | ICD-10-CM | POA: Insufficient documentation

## 2019-03-27 DIAGNOSIS — F419 Anxiety disorder, unspecified: Secondary | ICD-10-CM | POA: Diagnosis not present

## 2019-03-27 DIAGNOSIS — Z886 Allergy status to analgesic agent status: Secondary | ICD-10-CM | POA: Insufficient documentation

## 2019-03-27 DIAGNOSIS — Z792 Long term (current) use of antibiotics: Secondary | ICD-10-CM | POA: Insufficient documentation

## 2019-03-27 DIAGNOSIS — Z20828 Contact with and (suspected) exposure to other viral communicable diseases: Secondary | ICD-10-CM | POA: Insufficient documentation

## 2019-03-27 DIAGNOSIS — Z887 Allergy status to serum and vaccine status: Secondary | ICD-10-CM | POA: Insufficient documentation

## 2019-03-27 DIAGNOSIS — M1612 Unilateral primary osteoarthritis, left hip: Secondary | ICD-10-CM | POA: Insufficient documentation

## 2019-03-27 DIAGNOSIS — Z803 Family history of malignant neoplasm of breast: Secondary | ICD-10-CM | POA: Insufficient documentation

## 2019-03-27 LAB — GROUP A STREP BY PCR: Group A Strep by PCR: NOT DETECTED

## 2019-03-27 MED ORDER — AMOXICILLIN 500 MG PO CAPS: 500.0000 mg | ORAL_CAPSULE | Freq: Two times a day (BID) | ORAL | 0 refills | Status: AC

## 2019-03-27 NOTE — ED Triage Notes (Signed)
Pt c/o sore throat. Started yesterday. She noticed white patches on the back of her throat. Denies fever.

## 2019-03-27 NOTE — ED Provider Notes (Signed)
[facility]    CSN: [id number] Arrival date & time: 03/27/19  1139      History   Chief Complaint Chief Complaint  Patient presents with  . Sore Throat    HPI Lori Le is a 53 y.o. female history of Hashimoto's thyroiditis comes to urgent care with complaints of 1 day history of sore throat.  Symptoms started yesterday and has been persistent.  It is associated with some neck discomfort.  Sore throat is aggravated by swallowing.  No known relieving factors.  She has tried warm water gargles at home with no relief.  She denies any fever, chills, cough or sputum production.[name]   HPI  Past Medical History:  Diagnosis Date  . Allergic rhinitis due to allergen   . Allergy   . Anemia   . Anxiety   . Arthritis   . B12 deficiency   . Cancer ([place])    skin ca-melanoma  . Chronic idiopathic urticaria   . Complication of anesthesia    nausea, slow to wake up  . Depression   . Edema   . Family history of adverse reaction to anesthesia    Father - PONV  . Gastric ulcer   . GERD (gastroesophageal reflux disease)   . Gout   . Gout   . Hashimoto's thyroiditis   . Heart murmur   . History of eating disorder   . History of kidney stones    h/o years ago  . Hives   . Hyperlipidemia   . Hypertension   . Hypothyroidism   . Lower extremity edema   . Melanoma in situ ([place])    left shoulder  . Migraine    approx 4x/yr-migraines  . Mitral valve disorder   . Motion sickness    all moving vehicles  . Neuropathy    fingers and feet  . PCOS (polycystic ovarian syndrome)   . PONV (postoperative nausea and vomiting)   . Psoriatic arthritis ([place])   . Thyroid disease   . Ulcer   . Vertigo    last episode over 1 yr ago    Patient Active Problem List   Diagnosis Date Noted  . Psoriatic arthritis ([place]) 01/03/2019  . Rash 10/25/2018  . Gastric polyp   . Acute gastrojejunal ulcer without hemorrhage or perforation   . Toe pain 08/30/2018  . Hypokalemia 08/30/2018   . Constipation 08/30/2018  . UTI (urinary tract infection) 08/30/2018  . History of colon polyps 05/13/2018  . Health care maintenance   . Benign neoplasm of cecum   . Benign neoplasm of transverse colon   . CLE (columnar lined esophagus)   . Gastrointestinal ulcer   . Hot flashes 01/12/2018  . Chronic venous insufficiency 12/18/2017  . Lymphedema 12/18/2017  . Iron deficiency anemia following bariatric surgery 10/28/2017  . Bilateral lower extremity edema 10/07/2017  . Hyperglycemia 03/23/2017  . Rotator cuff syndrome 04/24/2015  . Arthritis of knee, degenerative 04/24/2015  . Degenerative arthritis of hip 04/24/2015  . B12 deficiency 01/06/2015  . Primary osteoarthritis of knee 12/01/2014  . Anemia 11/22/2014  . Primary localized osteoarthritis of left hip 09/27/2014  . Gout 03/20/2014  . Excessive and frequent menstruation 08/10/2013  . Derangement of posterior horn of medial meniscus 08/10/2013  . Mitral valve disease 08/10/2013  . Edema 12/30/2012  . Essential hypertension, benign 08/02/2012  . Melanoma in situ ([place]) 08/02/2012  . Mild depression ([place]) 08/02/2012  . Environmental allergies 08/02/2012  . Hypercholesterolemia 08/02/2012  .  Migraines 08/02/2012  . Hypothyroidism 08/02/2012  . PCOS (polycystic ovarian syndrome) 08/02/2012  . GERD (gastroesophageal reflux disease) 08/02/2012  . Intestinal bypass or anastomosis status 08/02/2012  . Food allergy 07/19/2011    Past Surgical History:  Procedure Laterality Date  . BONE EXCISION Left 09/03/2018   Procedure: PART EXCISION BONE-PHALANX 2,3,4 LEFT;  Surgeon: Albertine Patricia, DPM;  Location: [facility];  Service: Podiatry;  Laterality: Left;  . BREAST BIOPSY Right 2007   neg- bx/clip  . CHOLECYSTECTOMY  2003  . COLONOSCOPY WITH PROPOFOL N/A 05/11/2018   Procedure: COLONOSCOPY WITH PROPOFOL;  Surgeon: [name], MD;  Location: [facility];  Service: Endoscopy;  Laterality: N/A;  .  DILITATION & CURRETTAGE/HYSTROSCOPY WITH NOVASURE ABLATION N/A 08/19/2016   Procedure: DILATATION & CURETTAGE/HYSTEROSCOPY WITH NOVASURE ABLATION;  Surgeon: [name], MD;  Location: [facility];  Service: Gynecology;  Laterality: N/A;  . ESOPHAGOGASTRODUODENOSCOPY (EGD) WITH PROPOFOL N/A 12/05/2015   Procedure: ESOPHAGOGASTRODUODENOSCOPY (EGD) WITH PROPOFOL;  Surgeon: Lollie Sails, MD;  Location[facility];  Service: Endoscopy;  Laterality: N/A;  . ESOPHAGOGASTRODUODENOSCOPY (EGD) WITH PROPOFOL N/A 02/06/2016   Procedure: ESOPHAGOGASTRODUODENOSCOPY (EGD) WITH PROPOFOL;  Surgeon: Lollie Sails, MD;  Location: [facility] ENDOSCOPY;  Service: Endoscopy;  Laterality: N/A;  . ESOPHAGOGASTRODUODENOSCOPY (EGD) WITH PROPOFOL N/A 05/11/2018   Procedure: ESOPHAGOGASTRODUODENOSCOPY (EGD) WITH PROPOFOL;  Surgeon: [name], MD;  Location: [facility];  Service: Endoscopy;  Laterality: N/A;  . ESOPHAGOGASTRODUODENOSCOPY (EGD) WITH PROPOFOL N/A 09/08/2018   Procedure: ESOPHAGOGASTRODUODENOSCOPY (EGD) WITH BIOPSY;  Surgeon: [name], MD;  Location: [facility];  Service: Endoscopy;  Laterality: N/A;  . FOOT ARTHRODESIS Left 09/03/2018   Procedure: ARTHRODESIS; HALLUX/IP JOINT LEFT;  Surgeon: Albertine Patricia, DPM;  Location: [facility];  Service: Podiatry;  Laterality: Left;  LMA LOCAL  . FOOT SURGERY  1994  . GASTRIC BYPASS  2011  . JOINT REPLACEMENT Left 09/27/2014   HIP  . JOINT REPLACEMENT Left 12/01/2014   KNEE  . KNEE ARTHROSCOPY Right   . KNEE ARTHROSCOPY Left 03/23/2015   Procedure: ARTHROSCOPY KNEE, partial synovectomy;  Surgeon: Hessie Knows, MD;  Location: [facility];  Service: Orthopedics;  Laterality: Left;  . LAPAROSCOPIC GASTRIC BANDING  2010  . POLYPECTOMY N/A 09/08/2018   Procedure: POLYPECTOMY;  Surgeon: [name], MD;  Location: [facility];  Service: Endoscopy;  Laterality: N/A;  Stomach  . ROTATOR CUFF REPAIR Bilateral    right  shoulder 07-26-2016  . SHOULDER ARTHROSCOPY Left 06/20/2015   Procedure: ARTHROSCOPY SHOULDER, REPAIR OF MASSIVE ROTATOR CUFF TEAR, TENODESIS, DECOMPRESSION DEBRIDEMENT;  Surgeon: Corky Mull, MD;  Location: [facility];  Service: Orthopedics;  Laterality: Left;  . SHOULDER ARTHROSCOPY WITH OPEN ROTATOR CUFF REPAIR Right 12/22/2018   Procedure: SHOULDER ARTHROSCOPY WITH OPEN ROTATOR CUFF REPAIR;  Surgeon: Corky Mull, MD;  Location: [facility];  Service: Orthopedics;  Laterality: Right;  . TOTAL HIP ARTHROPLASTY Left 09/27/2014   Procedure: TOTAL HIP ARTHROPLASTY ANTERIOR APPROACH;  Surgeon: Hessie Knows, MD;  Location: [facility];  Service: Orthopedics;  Laterality: Left;  . TOTAL KNEE ARTHROPLASTY Left 12/01/2014   Procedure: TOTAL KNEE ARTHROPLASTY;  Surgeon: Hessie Knows, MD;  Location: [facility];  Service: Orthopedics;  Laterality: Left;    OB History    Gravida  0   Para  0   Term  0   Preterm  0   AB  0   Living  0     SAB  0   TAB  0  Ectopic  0   Multiple  0   Live Births  0            Home Medications    Prior to Admission medications   Medication Sig Start Date End Date Taking? Authorizing Provider  clobetasol ointment (TEMOVATE) [id number] % Apply 1 application topically at bedtime as needed (inflammation).  10/22/18  Yes [provider]  COSENTYX SENSOREADY, 300 MG, 150 MG/ML SOAJ  12/28/18  Yes [provider]  cyanocobalamin (,VITAMIN B-12,) 1000 MCG/ML injection ADM 1 ML IM Q 30 DAYS 12/08/18  Yes Corcoran, Melissa C, MD  EPINEPHrine 0.3 mg/0.3 mL IJ SOAJ injection Inject 0.3 mLs (0.3 mg total) into the muscle as needed for anaphylaxis. 12/31/18  Yes Eber Jones, MD  furosemide (LASIX) 40 MG tablet Take 1 tablet (40 mg total) by mouth daily. 10/22/18  Yes Einar Pheasant, MD  gabapentin (NEURONTIN) 300 MG capsule 300 mg 2 (two) times daily.  10/21/18  Yes [provider]  levocetirizine (XYZAL) 5 MG tablet  08/06/18  Yes [provider]  levothyroxine (SYNTHROID) 137 MCG tablet SMARTSIG:1 Tablet(s) By Mouth Daily 08/06/18  Yes [provider]  metFORMIN (GLUCOPHAGE) 500 MG tablet Take 1 tablet (500 mg total) by mouth daily with breakfast. 12/31/18 03/31/19 Yes Eber Jones, MD  montelukast (SINGULAIR) 10 MG tablet Take 10 mg by mouth at bedtime.   Yes [provider]  potassium chloride SA (K-DUR) 20 MEQ tablet Take two tablets (40 meq) by mouth daily in the morning and 1 tablet (20 meq) by mouth daily in the evening Patient taking differently: Take 20 mEq by mouth 2 (two) times daily. Take two tablets (40 meq) by mouth daily in the morning and 1 tablet (20 meq) by mouth daily in the evening 11/26/18  Yes Einar Pheasant, MD  sertraline (ZOLOFT) 100 MG tablet Take 1 tablet (100 mg total) by mouth every morning. 01/21/19 01/21/20 Yes Arfeen, Arlyce Harman, MD  triamcinolone (KENALOG) 0.025 % cream Apply 1 application topically 2 (two) times daily as needed (irritation).  08/17/18  Yes [provider]  triamterene-hydrochlorothiazide (MAXZIDE-25) 37.5-25 MG tablet Take 1 tablet by mouth daily. 10/22/18  Yes Einar Pheasant, MD  amoxicillin (AMOXIL) 500 MG capsule Take 1 capsule (500 mg total) by mouth 2 (two) times daily for 10 days. 03/27/19 04/06/19  Chase Picket, MD    Family History Family History  Problem Relation Age of Onset  . Arthritis Mother   . Hyperlipidemia Mother   . Hypertension Mother   . Diabetes Mother   . Sleep apnea Mother   . Obesity Mother   . Arthritis Father   . Hyperlipidemia Father   . Hypertension Father   . Mental illness Father   . Diabetes Father   . Obesity Father   . Sleep apnea Father   . Bipolar disorder Father   . Anxiety disorder Father   . Depression Father   . Cancer Father   . Kidney disease Father   . Arthritis Maternal Grandmother   . Cancer Maternal Grandmother        breast cancer  . Hyperlipidemia Maternal Grandmother   .  Hypertension Maternal Grandmother   . Breast cancer Maternal Grandmother   . Arthritis Maternal Grandfather   . Hyperlipidemia Maternal Grandfather   . Hypertension Maternal Grandfather   . Heart disease Maternal Grandfather        heart attack  . Breast cancer Paternal Grandmother  Social History Social History   Tobacco Use  . Smoking status: Never Smoker  . Smokeless tobacco: Never Used  Substance Use Topics  . Alcohol use: Yes    Alcohol/week: 0.0 - 1.0 standard drinks    Comment: occ.  . Drug use: No     Allergies   Contrast media [iodinated diagnostic agents], Etodolac, Alpha-gal, Influenza vaccines, Lactose, Lactose intolerance (gi), Bacitracin-neomycin-polymyxin, Neomycin-bacitracin zn-polymyx, and Neosporin + pain relief max st [neomy-bacit-polymyx-pramoxine]   Review of Systems Review of Systems  Constitutional: Negative.  Negative for chills, fatigue and fever.  HENT: Positive for sore throat. Negative for congestion and voice change.   Eyes: Negative.   Respiratory: Negative for cough, chest tightness and shortness of breath.   Gastrointestinal: Negative for diarrhea, nausea and vomiting.  Musculoskeletal: Negative for arthralgias, joint swelling and myalgias.  Skin: Negative.   Neurological: Negative.  Negative for dizziness, light-headedness and headaches.  Psychiatric/Behavioral: Negative.  Negative for confusion and decreased concentration.     Physical Exam Triage Vital Signs ED Triage Vitals  Enc Vitals Group     BP 03/27/19 1210 117/73     Pulse Rate 03/27/19 1210 99     Resp 03/27/19 1210 18     Temp 03/27/19 1210 98.2 F (36.8 C)     Temp Source 03/27/19 1210 Oral     SpO2 03/27/19 1210 98 %     Weight 03/27/19 1207 240 lb (108.9 kg)     Height 03/27/19 1207 5' 7.5" (1.715 m)     Head Circumference --      Peak Flow --      Pain Score 03/27/19 1206 4     Pain Loc --      Pain Edu? --      Excl. in [place]? --    No data  found.  Updated Vital Signs BP 117/73 (BP Location: Right Arm)   Pulse 99   Temp 98.2 F (36.8 C) (Oral)   Resp 18   Ht 5' 7.5" (1.715 m)   Wt 108.9 kg   SpO2 98%   BMI 37.03 kg/m   Visual Acuity Right Eye Distance:   Left Eye Distance:   Bilateral Distance:    Right Eye Near:   Left Eye Near:    Bilateral Near:     Physical Exam Constitutional:      General: She is in acute distress.     Appearance: She is not ill-appearing.  HENT:     Right Ear: Tympanic membrane normal. No swelling or tenderness. No middle ear effusion.     Left Ear: Tympanic membrane normal. No swelling or tenderness.  No middle ear effusion.     Mouth/Throat:     Mouth: Mucous membranes are moist.     Pharynx: Pharyngeal swelling and oropharyngeal exudate present. No uvula swelling.     Tonsils: 1+ on the right. 1+ on the left.  Eyes:     Extraocular Movements:     Right eye: Normal extraocular motion.     Left eye: Normal extraocular motion.     Conjunctiva/sclera: Conjunctivae normal.     Pupils: Pupils are equal, round, and reactive to light.  Cardiovascular:     Rate and Rhythm: Normal rate and regular rhythm.  Pulmonary:     Effort: Respiratory distress present.     Breath sounds: No rhonchi or rales.  Abdominal:     General: Bowel sounds are normal. There is no distension.     Palpations: Abdomen  is soft.     Tenderness: There is no guarding or rebound.  Skin:    General: Skin is warm.     Capillary Refill: Capillary refill takes less than 2 seconds.     Findings: No erythema or rash.  Neurological:     General: No focal deficit present.     Mental Status: She is alert.      UC Treatments / Results  Labs (all labs ordered are listed, but only abnormal results are displayed) Labs Reviewed  GROUP A STREP BY PCR  NOVEL CORONAVIRUS, NAA (HOSP ORDER, SEND-OUT TO REF LAB; TAT 18-24 HRS)    EKG   Radiology No results found.  Procedures Procedures (including critical care  time)  Medications Ordered in UC Medications - No data to display  Initial Impression / Assessment and Plan / UC Course  I have reviewed the triage vital signs and the nursing notes.  Pertinent labs & imaging results that were available during my care of the patient were reviewed by me and considered in my medical decision making (see chart for details).    1.  Acute pharyngitis likely streptococcal: Strep PCR is negative Given that the patient has pharyngeal erythema with whitish patch of the tonsils, I think this is streptococcal pharyngitis infection. Amoxicillin 500 twice daily for 10 days COVID-19 PCR test sent out Patient is advised to self isolate, maintain a safe distance, hand/respiratory etiquette and use face mask systemically. If patient symptoms worsens she is welcome to return to urgent care to be reevaluated. Final Clinical Impressions(s) / UC Diagnoses   Final diagnoses:  Viral pharyngitis   Discharge Instructions   None    ED Prescriptions    Medication Sig Dispense Auth. Provider   amoxicillin (AMOXIL) 500 MG capsule Take 1 capsule (500 mg total) by mouth 2 (two) times daily for 10 days. 20 capsule Rosalina Dingwall, Myrene Galas, MD     PDMP not reviewed this encounter.   Chase Picket, MD 03/27/19 1256

## 2019-03-28 ENCOUNTER — Other Ambulatory Visit (INDEPENDENT_AMBULATORY_CARE_PROVIDER_SITE_OTHER): Payer: Self-pay | Admitting: Family Medicine

## 2019-03-28 DIAGNOSIS — R7303 Prediabetes: Secondary | ICD-10-CM

## 2019-03-28 LAB — NOVEL CORONAVIRUS, NAA (HOSP ORDER, SEND-OUT TO REF LAB; TAT 18-24 HRS): SARS-CoV-2, NAA: NOT DETECTED

## 2019-03-29 ENCOUNTER — Ambulatory Visit
Admission: RE | Admit: 2019-03-29 | Discharge: 2019-03-29 | Disposition: A | Discharge status: Home / Self Care | Payer: Managed Care, Other (non HMO) | Source: Ambulatory Visit | Attending: Surgery | Admitting: Surgery

## 2019-03-29 DIAGNOSIS — M7521 Bicipital tendinitis, right shoulder: Secondary | ICD-10-CM | POA: Insufficient documentation

## 2019-03-29 DIAGNOSIS — M24111 Other articular cartilage disorders, right shoulder: Secondary | ICD-10-CM | POA: Diagnosis present

## 2019-03-29 DIAGNOSIS — M75121 Complete rotator cuff tear or rupture of right shoulder, not specified as traumatic: Secondary | ICD-10-CM | POA: Insufficient documentation

## 2019-04-19 ENCOUNTER — Ambulatory Visit: Payer: Managed Care, Other (non HMO) | Admitting: Internal Medicine

## 2019-04-23 ENCOUNTER — Ambulatory Visit (HOSPITAL_COMMUNITY): Payer: 59 | Admitting: Psychiatry

## 2019-04-30 ENCOUNTER — Ambulatory Visit: Payer: Self-pay | Admitting: Internal Medicine

## 2019-05-07 ENCOUNTER — Encounter: Payer: Self-pay | Admitting: Internal Medicine

## 2019-05-07 ENCOUNTER — Ambulatory Visit (INDEPENDENT_AMBULATORY_CARE_PROVIDER_SITE_OTHER): Payer: Managed Care, Other (non HMO) | Admitting: Internal Medicine

## 2019-05-07 ENCOUNTER — Telehealth: Payer: Self-pay | Admitting: Internal Medicine

## 2019-05-07 DIAGNOSIS — E7800 Pure hypercholesterolemia, unspecified: Secondary | ICD-10-CM

## 2019-05-07 DIAGNOSIS — F32A Depression, unspecified: Secondary | ICD-10-CM

## 2019-05-07 DIAGNOSIS — R739 Hyperglycemia, unspecified: Secondary | ICD-10-CM

## 2019-05-07 DIAGNOSIS — D509 Iron deficiency anemia, unspecified: Secondary | ICD-10-CM

## 2019-05-07 DIAGNOSIS — E039 Hypothyroidism, unspecified: Secondary | ICD-10-CM

## 2019-05-07 DIAGNOSIS — L4050 Arthropathic psoriasis, unspecified: Secondary | ICD-10-CM

## 2019-05-07 DIAGNOSIS — E876 Hypokalemia: Secondary | ICD-10-CM

## 2019-05-07 DIAGNOSIS — F320 Major depressive disorder, single episode, mild: Secondary | ICD-10-CM

## 2019-05-07 DIAGNOSIS — I10 Essential (primary) hypertension: Secondary | ICD-10-CM

## 2019-05-07 DIAGNOSIS — K219 Gastro-esophageal reflux disease without esophagitis: Secondary | ICD-10-CM

## 2019-05-07 MED ORDER — POTASSIUM CHLORIDE CRYS ER 20 MEQ PO TBCR: EXTENDED_RELEASE_TABLET | ORAL | 1 refills | Status: DC

## 2019-05-07 NOTE — Telephone Encounter (Signed)
LMTBC and schedule fasting labs within the week and a 47m follow up.. also need to get insurance info

## 2019-05-07 NOTE — Progress Notes (Signed)
Patient ID: Lori Le, female   DOB: [birth year], 54 y.o.   MRN: [id number]   Virtual Visit via video Note  This visit type was conducted due to national recommendations for restrictions regarding the COVID-19 pandemic (e.g. social distancing).  This format is felt to be most appropriate for this patient at this time.  All issues noted in this document were discussed and addressed.  No physical exam was performed (except for noted visual exam findings with Video Visits).   I connected with [name] by a video enabled telemedicine application  and verified that I am speaking with the correct person using two identifiers. Location patient: home Location provider: work  Persons participating in the virtual visit: patient, provider  The limitations, risks, security and privacy concerns of performing an evaluation and management service by video and the availability of in person appointments have been discussed.  The patient expressed understanding and agreed to proceed.   Reason for visit: scheduled follow up.    HPI: S/p recent right shoulder surgery.  Going to therapy.  Limited rom - upper arm.  Followed by ortho.  Increased stress related to this.  Blood pressure doing ok.  No acid reflux.  No chest pain or sob reported.  No abdominal pain.  Bowels stable.  Right eye - sore.  Question of sty.  Plans to f/u with ophthalmology.  Joints stiff in am.    Taking lasix qod.  Off potassium. Seeing psych.  Overall stable.     ROS: See pertinent positives and negatives per HPI.  Past Medical History:  Diagnosis Date  . Allergic rhinitis due to allergen   . Allergy   . Anemia   . Anxiety   . Arthritis   . B12 deficiency   . Cancer ([place])    skin ca-melanoma  . Chronic idiopathic urticaria   . Complication of anesthesia    nausea, slow to wake up  . Depression   . Edema   . Family history of adverse reaction to anesthesia    Father - PONV  . Gastric ulcer   . GERD (gastroesophageal  reflux disease)   . Gout   . Gout   . Hashimoto's thyroiditis   . Heart murmur   . History of eating disorder   . History of kidney stones    h/o years ago  . Hives   . Hyperlipidemia   . Hypertension   . Hypothyroidism   . Lower extremity edema   . Melanoma in situ ([place])    left shoulder  . Migraine    approx 4x/yr-migraines  . Mitral valve disorder   . Motion sickness    all moving vehicles  . Neuropathy    fingers and feet  . PCOS (polycystic ovarian syndrome)   . PONV (postoperative nausea and vomiting)   . Psoriatic arthritis ([place])   . Thyroid disease   . Ulcer   . Vertigo    last episode over 1 yr ago    Past Surgical History:  Procedure Laterality Date  . BONE EXCISION Left 09/03/2018   Procedure: PART EXCISION BONE-PHALANX 2,3,4 LEFT;  Surgeon: Albertine Patricia, DPM;  Location: [facility];  Service: Podiatry;  Laterality: Left;  . BREAST BIOPSY Right 2007   neg- bx/clip  . CHOLECYSTECTOMY  2003  . COLONOSCOPY WITH PROPOFOL N/A 05/11/2018   Procedure: COLONOSCOPY WITH PROPOFOL;  Surgeon: [name], MD;  Location: [facility];  Service: Endoscopy;  Laterality: N/A;  . DILITATION &  CURRETTAGE/HYSTROSCOPY WITH NOVASURE ABLATION N/A 08/19/2016   Procedure: DILATATION & CURETTAGE/HYSTEROSCOPY WITH NOVASURE ABLATION;  Surgeon: [name], MD;  Location: [facility];  Service: Gynecology;  Laterality: N/A;  . ESOPHAGOGASTRODUODENOSCOPY (EGD) WITH PROPOFOL N/A 12/05/2015   Procedure: ESOPHAGOGASTRODUODENOSCOPY (EGD) WITH PROPOFOL;  Surgeon: Lollie Sails, MD;  Location: [facility] ENDOSCOPY;  Service: Endoscopy;  Laterality: N/A;  . ESOPHAGOGASTRODUODENOSCOPY (EGD) WITH PROPOFOL N/A 02/06/2016   Procedure: ESOPHAGOGASTRODUODENOSCOPY (EGD) WITH PROPOFOL;  Surgeon: Lollie Sails, MD;  Location: [facility] ENDOSCOPY;  Service: Endoscopy;  Laterality: N/A;  . ESOPHAGOGASTRODUODENOSCOPY (EGD) WITH PROPOFOL N/A 05/11/2018   Procedure: ESOPHAGOGASTRODUODENOSCOPY  (EGD) WITH PROPOFOL;  Surgeon: [name], MD;  Location: [facility] ENDOSCOPY;  Service: Endoscopy;  Laterality: N/A;  . ESOPHAGOGASTRODUODENOSCOPY (EGD) WITH PROPOFOL N/A 09/08/2018   Procedure: ESOPHAGOGASTRODUODENOSCOPY (EGD) WITH BIOPSY;  Surgeon: [name], MD;  Location: [place];  Service: Endoscopy;  Laterality: N/A;  . FOOT ARTHRODESIS Left 09/03/2018   Procedure: ARTHRODESIS; HALLUX/IP JOINT LEFT;  Surgeon: Albertine Patricia, DPM;  Location: [place];  Service: Podiatry;  Laterality: Left;  LMA LOCAL  . FOOT SURGERY  1994  . GASTRIC BYPASS  2011  . JOINT REPLACEMENT Left 09/27/2014   HIP  . JOINT REPLACEMENT Left 12/01/2014   KNEE  . KNEE ARTHROSCOPY Right   . KNEE ARTHROSCOPY Left 03/23/2015   Procedure: ARTHROSCOPY KNEE, partial synovectomy;  Surgeon: Hessie Knows, MD;  Location: [facility];  Service: Orthopedics;  Laterality: Left;  . LAPAROSCOPIC GASTRIC BANDING  2010  . POLYPECTOMY N/A 09/08/2018   Procedure: POLYPECTOMY;  Surgeon: [name], MD;  Location: [place];  Service: Endoscopy;  Laterality: N/A;  Stomach  . ROTATOR CUFF REPAIR Bilateral    right shoulder 07-26-2016  . SHOULDER ARTHROSCOPY Left 06/20/2015   Procedure: ARTHROSCOPY SHOULDER, REPAIR OF MASSIVE ROTATOR CUFF TEAR, TENODESIS, DECOMPRESSION DEBRIDEMENT;  Surgeon: Corky Mull, MD;  Location: [facility];  Service: Orthopedics;  Laterality: Left;  . SHOULDER ARTHROSCOPY WITH OPEN ROTATOR CUFF REPAIR Right 12/22/2018   Procedure: SHOULDER ARTHROSCOPY WITH OPEN ROTATOR CUFF REPAIR;  Surgeon: Corky Mull, MD;  Location: [facility];  Service: Orthopedics;  Laterality: Right;  . TOTAL HIP ARTHROPLASTY Left 09/27/2014   Procedure: TOTAL HIP ARTHROPLASTY ANTERIOR APPROACH;  Surgeon: Hessie Knows, MD;  Location: [facility];  Service: Orthopedics;  Laterality: Left;  . TOTAL KNEE ARTHROPLASTY Left 12/01/2014   Procedure: TOTAL KNEE ARTHROPLASTY;  Surgeon: Hessie Knows, MD;   Location: [facility];  Service: Orthopedics;  Laterality: Left;    Family History  Problem Relation Age of Onset  . Arthritis Mother   . Hyperlipidemia Mother   . Hypertension Mother   . Diabetes Mother   . Sleep apnea Mother   . Obesity Mother   . Arthritis Father   . Hyperlipidemia Father   . Hypertension Father   . Mental illness Father   . Diabetes Father   . Obesity Father   . Sleep apnea Father   . Bipolar disorder Father   . Anxiety disorder Father   . Depression Father   . Cancer Father   . Kidney disease Father   . Arthritis Maternal Grandmother   . Cancer Maternal Grandmother        breast cancer  . Hyperlipidemia Maternal Grandmother   . Hypertension Maternal Grandmother   . Breast cancer Maternal Grandmother   . Arthritis Maternal Grandfather   . Hyperlipidemia Maternal Grandfather   . Hypertension Maternal Grandfather   . Heart disease Maternal  Grandfather        heart attack  . Breast cancer Paternal Grandmother     SOCIAL HX: reviewed.    Current Outpatient Medications:  .  pantoprazole (PROTONIX) 40 MG tablet, Take 40 mg by mouth daily., Disp: , Rfl:  .  clobetasol ointment (TEMOVATE) 2.95 %, Apply 1 application topically at bedtime as needed (inflammation). , Disp: , Rfl:  .  COSENTYX SENSOREADY, 300 MG, 150 MG/ML SOAJ, , Disp: , Rfl:  .  EPINEPHrine 0.3 mg/0.3 mL IJ SOAJ injection, Inject 0.3 mLs (0.3 mg total) into the muscle as needed for anaphylaxis., Disp: 1 each, Rfl: 0 .  furosemide (LASIX) 40 MG tablet, Take 1 tablet (40 mg total) by mouth daily., Disp: 90 tablet, Rfl: 1 .  gabapentin (NEURONTIN) 300 MG capsule, 300 mg 2 (two) times daily. , Disp: , Rfl:  .  levothyroxine (SYNTHROID) 137 MCG tablet, SMARTSIG:1 Tablet(s) By Mouth Daily, Disp: , Rfl:  .  metFORMIN (GLUCOPHAGE) 500 MG tablet, Take 1 tablet (500 mg total) by mouth daily with breakfast., Disp: 90 tablet, Rfl: 0 .  montelukast (SINGULAIR) 10 MG tablet, Take 10 mg by mouth at bedtime.,  Disp: , Rfl:  .  potassium chloride SA (KLOR-CON) 20 MEQ tablet, Take two tablets (40 meq) by mouth daily in the morning and 1 tablet (20 meq) by mouth daily in the evening, Disp: 90 tablet, Rfl: 1 .  sertraline (ZOLOFT) 100 MG tablet, Take 1 tablet (100 mg total) by mouth every morning., Disp: 90 tablet, Rfl: 0 .  triamterene-hydrochlorothiazide (MAXZIDE-25) 37.5-25 MG tablet, Take 1 tablet by mouth daily., Disp: 90 tablet, Rfl: 1 No current facility-administered medications for this visit.  Facility-Administered Medications Ordered in Other Visits:  .  0.9 %  sodium chloride infusion, , Intravenous, Continuous, Corcoran, Melissa C, MD, Last Rate: 0 mL/hr at 05/08/17 1437, New Bag at 05/11/18 1437 .  cyanocobalamin ((VITAMIN B-12)) injection 1,000 mcg, 1,000 mcg, Intramuscular, Once, Corcoran, Melissa C, MD  EXAM:  GENERAL: alert, oriented, appears well and in no acute distress  HEENT: atraumatic, conjunttiva clear, no obvious abnormalities on inspection of external nose and ears  NECK: normal movements of the head and neck  LUNGS: on inspection no signs of respiratory distress, breathing rate appears normal, no obvious gross SOB, gasping or wheezing  CV: no obvious cyanosis  PSYCH/NEURO: pleasant and cooperative, no obvious depression or anxiety, speech and thought processing grossly intact  ASSESSMENT AND PLAN:  Discussed the following assessment and plan:  Anemia Follow cbc.   GERD (gastroesophageal reflux disease) Controlled.   Essential hypertension, benign Blood pressure doing well.  Continue current medication regimen.  Follow pressures.    Hypercholesterolemia Low cholesterol diet and exercise.  Follow lipid panel.   Hyperglycemia Low carb diet and exercise.  Follow met b and a1c.   Hypokalemia Off potassium.  Recheck potassium.   Hypothyroidism On thyroid replacement.  Follow tsh.   Mild depression ([place]) Seeing psychiatry.  On zoloft.  Stable.   Psoriatic  arthritis ([place]) On cosentyx.     Meds ordered this encounter  Medications  . potassium chloride SA (KLOR-CON) 20 MEQ tablet    Sig: Take two tablets (40 meq) by mouth daily in the morning and 1 tablet (20 meq) by mouth daily in the evening    Dispense:  90 tablet    Refill:  1     I discussed the assessment and treatment plan with the patient. The patient was provided an opportunity to  ask questions and all were answered. The patient agreed with the plan and demonstrated an understanding of the instructions.   The patient was advised to call back or seek an in-person evaluation if the symptoms worsen or if the condition fails to improve as anticipated.   Einar Pheasant, MD

## 2019-05-10 ENCOUNTER — Other Ambulatory Visit: Payer: Self-pay

## 2019-05-10 ENCOUNTER — Other Ambulatory Visit (INDEPENDENT_AMBULATORY_CARE_PROVIDER_SITE_OTHER): Payer: Managed Care, Other (non HMO)

## 2019-05-10 DIAGNOSIS — I10 Essential (primary) hypertension: Secondary | ICD-10-CM

## 2019-05-10 DIAGNOSIS — D500 Iron deficiency anemia secondary to blood loss (chronic): Secondary | ICD-10-CM

## 2019-05-10 DIAGNOSIS — E7800 Pure hypercholesterolemia, unspecified: Secondary | ICD-10-CM

## 2019-05-11 LAB — CBC WITH DIFFERENTIAL/PLATELET
Basophils Absolute: 0.1 10*3/uL (ref 0.0–0.2)
Basos: 1 %
EOS (ABSOLUTE): 0.1 10*3/uL (ref 0.0–0.4)
Eos: 2 %
Hematocrit: 40 % (ref 34.0–46.6)
Hemoglobin: 13.7 g/dL (ref 11.1–15.9)
Immature Grans (Abs): 0 10*3/uL (ref 0.0–0.1)
Immature Granulocytes: 0 %
Lymphocytes Absolute: 1.7 10*3/uL (ref 0.7–3.1)
Lymphs: 29 %
MCH: 30.2 pg (ref 26.6–33.0)
MCHC: 34.3 g/dL (ref 31.5–35.7)
MCV: 88 fL (ref 79–97)
Monocytes Absolute: 0.4 10*3/uL (ref 0.1–0.9)
Monocytes: 8 %
Neutrophils Absolute: 3.5 10*3/uL (ref 1.4–7.0)
Neutrophils: 60 %
Platelets: 257 10*3/uL (ref 150–450)
RBC: 4.54 x10E6/uL (ref 3.77–5.28)
RDW: 12.5 % (ref 11.7–15.4)
WBC: 5.8 10*3/uL (ref 3.4–10.8)

## 2019-05-11 LAB — BASIC METABOLIC PANEL
BUN/Creatinine Ratio: 25 — ABNORMAL HIGH (ref 9–23)
BUN: 17 mg/dL (ref 6–24)
CO2: 25 mmol/L (ref 20–29)
Calcium: 9.3 mg/dL (ref 8.7–10.2)
Chloride: 100 mmol/L (ref 96–106)
Creatinine, Ser: 0.67 mg/dL (ref 0.57–1.00)
GFR calc Af Amer: 116 mL/min/{1.73_m2} (ref >59)
GFR calc non Af Amer: 101 mL/min/{1.73_m2} (ref >59)
Glucose: 97 mg/dL (ref 65–99)
Potassium: 4.2 mmol/L (ref 3.5–5.2)
Sodium: 141 mmol/L (ref 134–144)

## 2019-05-11 LAB — HEPATIC FUNCTION PANEL
ALT: 9 IU/L (ref 0–32)
AST: 8 IU/L (ref 0–40)
Albumin: 4.1 g/dL (ref 3.8–4.9)
Alkaline Phosphatase: 81 IU/L (ref 39–117)
Bilirubin Total: 0.3 mg/dL (ref 0.0–1.2)
Bilirubin, Direct: 0.09 mg/dL (ref 0.00–0.40)
Total Protein: 6.5 g/dL (ref 6.0–8.5)

## 2019-05-11 LAB — LIPID PANEL
Chol/HDL Ratio: 4.3 ratio (ref 0.0–4.4)
Cholesterol, Total: 242 mg/dL — ABNORMAL HIGH (ref 100–199)
HDL: 56 mg/dL (ref >39)
LDL Chol Calc (NIH): 148 mg/dL — ABNORMAL HIGH (ref 0–99)
Triglycerides: 211 mg/dL — ABNORMAL HIGH (ref 0–149)
VLDL Cholesterol Cal: 38 mg/dL (ref 5–40)

## 2019-05-15 ENCOUNTER — Encounter: Payer: Self-pay | Admitting: Internal Medicine

## 2019-05-15 NOTE — Assessment & Plan Note (Signed)
Low cholesterol diet and exercise.  Follow lipid panel.   

## 2019-05-15 NOTE — Assessment & Plan Note (Signed)
Off potassium.  Recheck potassium.

## 2019-05-15 NOTE — Assessment & Plan Note (Signed)
Low carb diet and exercise.  Follow met b and a1c.  

## 2019-05-15 NOTE — Assessment & Plan Note (Signed)
Seeing psychiatry.  On zoloft.  Stable.

## 2019-05-15 NOTE — Assessment & Plan Note (Signed)
Controlled.  

## 2019-05-15 NOTE — Assessment & Plan Note (Signed)
On thyroid replacement.  Follow tsh.  

## 2019-05-15 NOTE — Assessment & Plan Note (Signed)
Follow cbc.  

## 2019-05-15 NOTE — Assessment & Plan Note (Signed)
On cosentyx.

## 2019-05-15 NOTE — Assessment & Plan Note (Signed)
Blood pressure doing well.  Continue current medication regimen.  Follow pressures.

## 2019-05-18 ENCOUNTER — Other Ambulatory Visit: Payer: Self-pay

## 2019-05-18 ENCOUNTER — Telehealth: Payer: Self-pay | Admitting: Internal Medicine

## 2019-05-18 MED ORDER — FUROSEMIDE 40 MG PO TABS: 40.0000 mg | ORAL_TABLET | Freq: Every day | ORAL | 1 refills | Status: DC

## 2019-05-18 MED ORDER — TRIAMTERENE-HCTZ 37.5-25 MG PO TABS: 1.0000 | ORAL_TABLET | Freq: Every day | ORAL | 1 refills | Status: DC

## 2019-05-18 NOTE — Telephone Encounter (Signed)
Rx sent in

## 2019-05-18 NOTE — Telephone Encounter (Signed)
Patient needs the following medications refilled; furosemide (LASIX) 40 MG tablet andtriamterene-hydrochlorothiazide (MAXZIDE-25) 37.5-25 MG tablet . Patient is out of medication.

## 2019-05-21 ENCOUNTER — Other Ambulatory Visit: Payer: Self-pay | Admitting: *Deleted

## 2019-05-21 ENCOUNTER — Other Ambulatory Visit: Payer: Self-pay

## 2019-05-21 DIAGNOSIS — D509 Iron deficiency anemia, unspecified: Secondary | ICD-10-CM

## 2019-05-21 DIAGNOSIS — K9589 Other complications of other bariatric procedure: Secondary | ICD-10-CM

## 2019-05-21 NOTE — Telephone Encounter (Signed)
See result note.  

## 2019-05-21 NOTE — Telephone Encounter (Signed)
Pt's phone did an update and just saw there was a voicemail from you. Please call back if needed

## 2019-05-24 ENCOUNTER — Other Ambulatory Visit: Payer: Self-pay

## 2019-05-24 ENCOUNTER — Inpatient Hospital Stay: Payer: Managed Care, Other (non HMO) | Attending: Hematology and Oncology

## 2019-05-24 DIAGNOSIS — L4050 Arthropathic psoriasis, unspecified: Secondary | ICD-10-CM | POA: Insufficient documentation

## 2019-05-24 DIAGNOSIS — Z9884 Bariatric surgery status: Secondary | ICD-10-CM | POA: Diagnosis not present

## 2019-05-24 DIAGNOSIS — E538 Deficiency of other specified B group vitamins: Secondary | ICD-10-CM | POA: Insufficient documentation

## 2019-05-24 DIAGNOSIS — D509 Iron deficiency anemia, unspecified: Secondary | ICD-10-CM | POA: Insufficient documentation

## 2019-05-24 LAB — CBC WITH DIFFERENTIAL/PLATELET
Abs Immature Granulocytes: 0.02 10*3/uL (ref 0.00–0.07)
Basophils Absolute: 0.1 10*3/uL (ref 0.0–0.1)
Basophils Relative: 1 %
Eosinophils Absolute: 0.2 10*3/uL (ref 0.0–0.5)
Eosinophils Relative: 3 %
HCT: 43.1 % (ref 36.0–46.0)
Hemoglobin: 13.9 g/dL (ref 12.0–15.0)
Immature Granulocytes: 0 %
Lymphocytes Relative: 29 %
Lymphs Abs: 2.1 10*3/uL (ref 0.7–4.0)
MCH: 29.6 pg (ref 26.0–34.0)
MCHC: 32.3 g/dL (ref 30.0–36.0)
MCV: 91.9 fL (ref 80.0–100.0)
Monocytes Absolute: 0.6 10*3/uL (ref 0.1–1.0)
Monocytes Relative: 9 %
Neutro Abs: 4.1 10*3/uL (ref 1.7–7.7)
Neutrophils Relative %: 58 %
Platelets: 305 10*3/uL (ref 150–400)
RBC: 4.69 MIL/uL (ref 3.87–5.11)
RDW: 13.2 % (ref 11.5–15.5)
WBC: 7 10*3/uL (ref 4.0–10.5)
nRBC: 0 % (ref 0.0–0.2)

## 2019-05-24 LAB — IRON AND TIBC
Iron: 52 ug/dL (ref 28–170)
Saturation Ratios: 15 % (ref 10.4–31.8)
TIBC: 340 ug/dL (ref 250–450)
UIBC: 288 ug/dL

## 2019-05-24 LAB — FERRITIN: Ferritin: 32 ng/mL (ref 11–307)

## 2019-05-24 NOTE — Progress Notes (Signed)
[facility]  [address] [place], [place] [address] Phone: [phone]  Fax: [phone]   Clinic Day:  05/26/2019  Referring physician: Einar Pheasant, MD  Chief Complaint: ULA CATES is a 54 y.o. female with s/p gastric bypass surgery and subsequent iron deficiency and B12 deficiency who is seen for a 6 month assessment.   HPI: The patient was last seen in the hematology clinic on 11/25/2018. At that time, she felt "alright". She denied any bleeding. She denied any pica. Exam was stable. Hematocrit 41.0, hemoglobin 13.5, MCV 91.1, platelets 270,000, WBC 10,400. Sodium 134, potassium 3.2, calcium 8.6.    Patient reported taking oral potassium 40 MEQ in AM and 20 MEQ in PM but had been out for over a week on 11/26/2018. Patient was educated on potassium rich foods. Patient was advised to maker her PCP aware of potassium. Dr. Bary Leriche nurse put in a refill request her potassium.   She received Venofer weekly x 3 (11/25/2018 - 12/16/2018).   Patient underwent right shoulder arthroscopy with rotator cuff repair with Dr. Milagros Evener in orthopedics on 12/22/2018.  Estimated blood loss was 15 cc.   The patient had a virtual visit with her PCP on 12/28/2018. She had increased pain. She was not on any medication s/p right shoulder arthroscopy with rotator cuff repair. She had increased flare with her skin. Her breathing was stable. She had no swelling or abdominal pain. She remained on her current regimen.   She was seen by Dr. Bonna Gains on 01/26/2019. She was asymptomatic. She was taking Protonix to control heartburn. Her Protonix was discontinued. EGD was recommended in 3 years.   Patient had a follow up with Dr. Milagros Evener on 05/19/2019. She had pain with motion in her right shoulder. Steroid injection did not relieve patient of pain. She saw no improvement with physical therapy. Patient was referred to Dr. Donnie Coffin at [facility] to discuss alternate treatment  options like tendon transfer. She continued Tylenol for discomfort.   Labs followed: 02/17/2019: Ferritin was 86. Potassium 3.6. 05/24/2019: Hematocrit 43.1, hemoglobin 13.9, MCV 91.9, platelets 305,000, WBC 7,000. Ferritin 32 with iron saturation 15% and TIBC 340.   During the interim, she has felt ok. She notes right shoulder and right ankle pain secondary to psoriatic arthritis. Right ankle pain started this morning. She notes after walking on it for a while, the pain is better. She had right shoulder swelling. She began a biologic shot in 12/2018. The first shot helped with pain for 5 day. The second shot helped with pain for 3 days. Ever since her third shot the pain would go away for 1 day or not at all. She will be started on self injections called Tremfya to see if this will help improve her arthritis. She will be seen in [facility] for pain next week.   She notes a poor diet. She is eating more meat but it increased her cholesterol. She has no shortness of breath or chest pains. She has no ice pica. She notes restless legs and is on gabapentin. She denies any dizziness when being over or upon standing.    Past Medical History:  Diagnosis Date  . Allergic rhinitis due to allergen   . Allergy   . Anemia   . Anxiety   . Arthritis   . B12 deficiency   . Cancer ([place])    skin ca-melanoma  . Chronic idiopathic urticaria   . Complication of anesthesia    nausea,  slow to wake up  . Depression   . Edema   . Family history of adverse reaction to anesthesia    Father - PONV  . Gastric ulcer   . GERD (gastroesophageal reflux disease)   . Gout   . Gout   . Hashimoto's thyroiditis   . Heart murmur   . History of eating disorder   . History of kidney stones    h/o years ago  . Hives   . Hyperlipidemia   . Hypertension   . Hypothyroidism   . Lower extremity edema   . Melanoma in situ ([place])    left shoulder  . Migraine    approx 4x/yr-migraines  . Mitral valve disorder   . Motion  sickness    all moving vehicles  . Neuropathy    fingers and feet  . PCOS (polycystic ovarian syndrome)   . PONV (postoperative nausea and vomiting)   . Psoriatic arthritis ([place])   . Thyroid disease   . Ulcer   . Vertigo    last episode over 1 yr ago    Past Surgical History:  Procedure Laterality Date  . BONE EXCISION Left 09/03/2018   Procedure: PART EXCISION BONE-PHALANX 2,3,4 LEFT;  Surgeon: Albertine Patricia, DPM;  Location: [place];  Service: Podiatry;  Laterality: Left;  . BREAST BIOPSY Right 2007   neg- bx/clip  . CHOLECYSTECTOMY  2003  . COLONOSCOPY WITH PROPOFOL N/A 05/11/2018   Procedure: COLONOSCOPY WITH PROPOFOL;  Surgeon: [name], MD;  Location: [facility] ENDOSCOPY;  Service: Endoscopy;  Laterality: N/A;  . DILITATION & CURRETTAGE/HYSTROSCOPY WITH NOVASURE ABLATION N/A 08/19/2016   Procedure: DILATATION & CURETTAGE/HYSTEROSCOPY WITH NOVASURE ABLATION;  Surgeon: [name], MD;  Location: [facility];  Service: Gynecology;  Laterality: N/A;  . ESOPHAGOGASTRODUODENOSCOPY (EGD) WITH PROPOFOL N/A 12/05/2015   Procedure: ESOPHAGOGASTRODUODENOSCOPY (EGD) WITH PROPOFOL;  Surgeon: Lollie Sails, MD;  Location: [facility] ENDOSCOPY;  Service: Endoscopy;  Laterality: N/A;  . ESOPHAGOGASTRODUODENOSCOPY (EGD) WITH PROPOFOL N/A 02/06/2016   Procedure: ESOPHAGOGASTRODUODENOSCOPY (EGD) WITH PROPOFOL;  Surgeon: Lollie Sails, MD;  Location: [facility] ENDOSCOPY;  Service: Endoscopy;  Laterality: N/A;  . ESOPHAGOGASTRODUODENOSCOPY (EGD) WITH PROPOFOL N/A 05/11/2018   Procedure: ESOPHAGOGASTRODUODENOSCOPY (EGD) WITH PROPOFOL;  Surgeon: [name], MD;  Location: [facility] ENDOSCOPY;  Service: Endoscopy;  Laterality: N/A;  . ESOPHAGOGASTRODUODENOSCOPY (EGD) WITH PROPOFOL N/A 09/08/2018   Procedure: ESOPHAGOGASTRODUODENOSCOPY (EGD) WITH BIOPSY;  Surgeon: [name], MD;  Location: [place];  Service: Endoscopy;  Laterality: N/A;  . FOOT ARTHRODESIS Left  09/03/2018   Procedure: ARTHRODESIS; HALLUX/IP JOINT LEFT;  Surgeon: Albertine Patricia, DPM;  Location: [place];  Service: Podiatry;  Laterality: Left;  LMA LOCAL  . FOOT SURGERY  1994  . GASTRIC BYPASS  2011  . JOINT REPLACEMENT Left 09/27/2014   HIP  . JOINT REPLACEMENT Left 12/01/2014   KNEE  . KNEE ARTHROSCOPY Right   . KNEE ARTHROSCOPY Left 03/23/2015   Procedure: ARTHROSCOPY KNEE, partial synovectomy;  Surgeon: Hessie Knows, MD;  Location: [facility];  Service: Orthopedics;  Laterality: Left;  . LAPAROSCOPIC GASTRIC BANDING  2010  . POLYPECTOMY N/A 09/08/2018   Procedure: POLYPECTOMY;  Surgeon: [name], MD;  Location: [place];  Service: Endoscopy;  Laterality: N/A;  Stomach  . ROTATOR CUFF REPAIR Bilateral    right shoulder 07-26-2016  . SHOULDER ARTHROSCOPY Left 06/20/2015   Procedure: ARTHROSCOPY SHOULDER, REPAIR OF MASSIVE ROTATOR CUFF TEAR, TENODESIS, DECOMPRESSION DEBRIDEMENT;  Surgeon: Corky Mull, MD;  Location:  [facility];  Service: Orthopedics;  Laterality: Left;  . SHOULDER ARTHROSCOPY WITH OPEN ROTATOR CUFF REPAIR Right 12/22/2018   Procedure: SHOULDER ARTHROSCOPY WITH OPEN ROTATOR CUFF REPAIR;  Surgeon: Corky Mull, MD;  Location: [facility];  Service: Orthopedics;  Laterality: Right;  . TOTAL HIP ARTHROPLASTY Left 09/27/2014   Procedure: TOTAL HIP ARTHROPLASTY ANTERIOR APPROACH;  Surgeon: Hessie Knows, MD;  Location: [facility];  Service: Orthopedics;  Laterality: Left;  . TOTAL KNEE ARTHROPLASTY Left 12/01/2014   Procedure: TOTAL KNEE ARTHROPLASTY;  Surgeon: Hessie Knows, MD;  Location: [facility];  Service: Orthopedics;  Laterality: Left;    Family History  Problem Relation Age of Onset  . Arthritis Mother   . Hyperlipidemia Mother   . Hypertension Mother   . Diabetes Mother   . Sleep apnea Mother   . Obesity Mother   . Arthritis Father   . Hyperlipidemia Father   . Hypertension Father   . Mental illness Father   . Diabetes Father   .  Obesity Father   . Sleep apnea Father   . Bipolar disorder Father   . Anxiety disorder Father   . Depression Father   . Cancer Father   . Kidney disease Father   . Arthritis Maternal Grandmother   . Cancer Maternal Grandmother        breast cancer  . Hyperlipidemia Maternal Grandmother   . Hypertension Maternal Grandmother   . Breast cancer Maternal Grandmother   . Arthritis Maternal Grandfather   . Hyperlipidemia Maternal Grandfather   . Hypertension Maternal Grandfather   . Heart disease Maternal Grandfather        heart attack  . Breast cancer Paternal Grandmother     Social History:  reports that she has never smoked. She has never used smokeless tobacco. She reports current alcohol use. She reports that she does not use drugs. The patient lives in [place]. 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 The patient is alone today.  Allergies:  Allergies  Allergen Reactions  . Contrast Media [Iodinated Diagnostic Agents] Anaphylaxis    MRI dye  . Etodolac Anaphylaxis  . Alpha-Gal     Flu- like symptoms  . Influenza Vaccines Hives  . Lactose Nausea And Vomiting    Can have yogurt Can not have milk and ice cream   . Lactose Intolerance (Gi) Nausea And Vomiting    Can have yogurt Can not have milk and ice cream    . Bacitracin-Neomycin-Polymyxin Rash  . Neomycin-Bacitracin Zn-Polymyx Rash  . Neosporin + Pain Relief Max St [Neomy-Bacit-Polymyx-Pramoxine] Rash    Current Medications: Current Outpatient Medications  Medication Sig Dispense Refill  . EPINEPHrine 0.3 mg/0.3 mL IJ SOAJ injection Inject 0.3 mLs (0.3 mg total) into the muscle as needed for anaphylaxis. 1 each 0  . furosemide (LASIX) 40 MG tablet Take 1 tablet (40 mg total) by mouth daily. 90 tablet 1  . gabapentin (NEURONTIN) 300 MG capsule 300 mg 2 (two) times daily.     [name] levothyroxine (SYNTHROID) 137 MCG tablet SMARTSIG:1 Tablet(s) By Mouth Daily    . metFORMIN (GLUCOPHAGE) 500 MG tablet  Take 1 tablet (500 mg total) by mouth daily with breakfast. 90 tablet 0  . montelukast (SINGULAIR) 10 MG tablet Take 10 mg by mouth at bedtime.    . pantoprazole (PROTONIX) 40 MG tablet Take 40 mg by mouth daily.    . potassium chloride SA (KLOR-CON) 20 MEQ tablet Take two tablets (40 meq) by mouth daily  in the morning and 1 tablet (20 meq) by mouth daily in the evening 90 tablet 1  . sertraline (ZOLOFT) 100 MG tablet Take 1 tablet (100 mg total) by mouth every morning. 90 tablet 0  . triamterene-hydrochlorothiazide (MAXZIDE-25) 37.5-25 MG tablet Take 1 tablet by mouth daily. 90 tablet 1  . clobetasol ointment (TEMOVATE) [id number] % Apply 1 application topically at bedtime as needed (inflammation).     . COSENTYX SENSOREADY, 300 MG, 150 MG/ML SOAJ     . Guselkumab (TREMFYA) 100 MG/ML SOPN Inject into the skin.     No current facility-administered medications for this visit.   Facility-Administered Medications Ordered in Other Visits  Medication Dose Route Frequency Provider Last Rate Last Admin  . 0.9 %  sodium chloride infusion   Intravenous Continuous Lequita Asal, MD 0 mL/hr at 05/08/17 1437 New Bag at 05/11/18 1437  . cyanocobalamin ((VITAMIN B-12)) injection 1,000 mcg  1,000 mcg Intramuscular Once Lequita Asal, MD        Review of Systems  Constitutional: Negative for chills, diaphoresis, fever, malaise/fatigue and weight loss (up 11 lbs).       Feels "ok".  HENT: Negative.  Negative for congestion, ear discharge, ear pain, hearing loss, nosebleeds, sinus pain and sore throat.   Eyes: Negative.  Negative for blurred vision, double vision and photophobia.  Respiratory: Positive for shortness of breath (climbing stairs). Negative for cough, sputum production and wheezing.   Cardiovascular: Negative.  Negative for chest pain, palpitations, orthopnea, leg swelling and PND.  Gastrointestinal: Negative.  Negative for blood in stool, constipation, diarrhea, melena and nausea.        Poor diet. Eating more meat.  Genitourinary: Negative.  Negative for dysuria, frequency, hematuria and urgency.  Musculoskeletal: Positive for joint pain (right should; right ankle; psoriatic arthritis). Negative for back pain and myalgias.       Right shoulder swelling.  Skin: Negative.  Negative for rash.  Neurological: Negative for dizziness, tingling, sensory change, speech change, focal weakness, weakness and headaches.       Restless legs.  Endo/Heme/Allergies: Does not bruise/bleed easily.       Thyroid disease on Synthroid.   Psychiatric/Behavioral: Negative.  Negative for depression and substance abuse. The patient is not nervous/anxious and does not have insomnia.   All other systems reviewed and are negative.  Performance status (ECOG): 1  Vitals Blood pressure 109/61, pulse 82, temperature (!) 96.9 F (36.1 C), temperature source Tympanic, resp. rate 18, height 5' 7.5" (1.715 m), weight 250 lb 10.6 oz (113.7 kg), SpO2 97 %.   Physical Exam  Constitutional: She is oriented to person, place, and time. She appears well-developed and well-nourished. No distress.  HENT:  Head: Normocephalic and atraumatic.  Mouth/Throat: Oropharynx is clear and moist. No oropharyngeal exudate.  Short blonde hair.  Mask.  Eyes: Pupils are equal, round, and reactive to light. Conjunctivae and EOM are normal. No scleral icterus.  Blue eyes.  Neck: No JVD present.  Cardiovascular: Normal rate, regular rhythm and normal heart sounds. Exam reveals no gallop and no friction rub.  No murmur heard. Pulmonary/Chest: Effort normal and breath sounds normal. No respiratory distress. She has no wheezes. She has no rales.  Abdominal: Soft. Bowel sounds are normal. She exhibits no distension and no mass. There is no abdominal tenderness. There is no rebound and no guarding.  Musculoskeletal:        General: No edema. Normal range of motion.     Right shoulder: Tenderness  present.     Cervical back: Normal  range of motion and neck supple.     Comments: Walks with a limp.  Lymphadenopathy:       Head (right side): No preauricular, no posterior auricular and no occipital adenopathy present.       Head (left side): No preauricular, no posterior auricular and no occipital adenopathy present.    She has no cervical adenopathy.    She has no axillary adenopathy.       Right: No inguinal and no supraclavicular adenopathy present.       Left: No inguinal and no supraclavicular adenopathy present.  Neurological: She is alert and oriented to person, place, and time.  Skin: Skin is warm and dry. She is not diaphoretic. No erythema. No pallor.  Psychiatric: She has a normal mood and affect. Her behavior is normal. Judgment and thought content normal.  Nursing note and vitals reviewed.   Appointment on 05/24/2019  Component Date Value Ref Range Status  . Iron 05/24/2019 52  28 - 170 ug/dL Final  . TIBC 05/24/2019 340  250 - 450 ug/dL Final  . Saturation Ratios 05/24/2019 15  10.4 - 31.8 % Final  . UIBC 05/24/2019 288  ug/dL Final   Performed at [facility], [address] [place], [place] [address]  . Ferritin 05/24/2019 32  11 - 307 ng/mL Final   Performed at [facility], [address] [place], [place] [address]  . WBC 05/24/2019 7.0  4.0 - 10.5 K/uL Final  . RBC 05/24/2019 4.69  3.87 - 5.11 MIL/uL Final  . Hemoglobin 05/24/2019 13.9  12.0 - 15.0 g/dL Final  . HCT 05/24/2019 43.1  36.0 - 46.0 % Final  . MCV 05/24/2019 91.9  80.0 - 100.0 fL Final  . MCH 05/24/2019 29.6  26.0 - 34.0 pg Final  . MCHC 05/24/2019 32.3  30.0 - 36.0 g/dL Final  . RDW 05/24/2019 13.2  11.5 - 15.5 % Final  . Platelets 05/24/2019 305  150 - 400 K/uL Final  . nRBC 05/24/2019 0.0  0.0 - 0.2 % Final  . Neutrophils Relative % 05/24/2019 58  % Final  . Neutro Abs 05/24/2019 4.1  1.7 - 7.7 K/uL Final  . Lymphocytes Relative 05/24/2019 29  % Final  . Lymphs Abs 05/24/2019 2.1  0.7 - 4.0 K/uL Final  .  Monocytes Relative 05/24/2019 9  % Final  . Monocytes Absolute 05/24/2019 0.6  0.1 - 1.0 K/uL Final  . Eosinophils Relative 05/24/2019 3  % Final  . Eosinophils Absolute 05/24/2019 0.2  0.0 - 0.5 K/uL Final  . Basophils Relative 05/24/2019 1  % Final  . Basophils Absolute 05/24/2019 0.1  0.0 - 0.1 K/uL Final  . Immature Granulocytes 05/24/2019 0  % Final  . Abs Immature Granulocytes 05/24/2019 0.02  0.00 - 0.07 K/uL Final   Performed at [facility], [address] [place], [place] [address]    Assessment:  Lori Le is a 54 y.o. female s/p gastric bypass surgery(2011) with iron deficiency anemia. She has a history of bleeding ulcerin 10/2015. She hadheavy menses. She underwent uterine ablationon 08/19/2016. She bruises easily.  EGDon 12/05/2015 revealed edema and erythema of the gastrojejunal anastomosis. She was treated with Carafate and Protonix. Repeat EGDon 02/06/2016 revealed minimal erythema on the tips of some gastric folds.  EGDon 05/11/2018 revealed salmon-colored mucosa suspicious for short-segment Barrett's esophagus.Pathology revealed squamocolumnar mucosa with features of mild reflux gastroesophagitis. There was no  dysplasia or malignancy. Gastric bypasswith gastrojejunal anastomosiswascharacterized by ulceration.Pathology revealed oxyntic mucosa with mild oxyntic gland hyperplasia. There was no malignancy, dysplasia, or H pylori. Examined jejunumwas normal.  EGD on 09/08/2018 revealed salmon-colored mucosa suspicious for short-segment Barrett's esophagus. There were a few gastric polyps. Roux-en-Y gastrojejunostomy with gastrojejunal anastomosis characterized by healthy appearing mucosa and an intact appearance was noted. Normal examined jejunum.  Pathology revealed gastric xanthoma, fundic gland polyp, and oxyntic gland hyperplasia, consistent with proton pump inhibitor effect and negative for H pylori, intestinal metaplasia, dysplasia, and  malignancy. There was squamocolumnar mucosa with mild chronic inflammation.   Colonoscopyon 05/11/2018 revealed one 10 mm polyp in the cecum(tubular adenoma),one 3 mm polyp in the transverse colon,one 7 mm polyp in the transverse colon,and one 6 mm polyp in the transverse colon(all fragments of tubular adenomas). Repeat colonoscopy in 3 years planned.  Dietis good. She denies any ice pica. She is intolerant of oral ironsecondary to severe constipation. She has B12 deficiency. B12 was 168 on 12/29/2012, 367 on 11/29/2015, and 252 on 10/24/2016. She restarted B12 (last in clinic 10/16/2017). She receives B12 injections monthly at home.  Folate was > 20 on 06/17/2018.  Workup on 12/08/2017revealed a hematocrit of 30.1, hemoglobin 9.1, MCV 70.7, platelets 331,000, WBC 8200 with an ANC of 5800. Ferritinwas 5. PT and PTT were normal. Platelet function assay was normal.  She received Venofer200 mg IV weekly x 4 (03/15/2016 - 04/05/2016), x 2 (07/23/2016 and 08/05/2016), x 3 (01/29/2017 - 02/12/2017), x 2 (05/01/2017 - 05/08/2017), x 2 (08/04/2017 - 08/13/2017), x2 (05/25/2018 - 06/02/2018), and x3 (11/25/2018 - 12/16/2018). She receives Venofer if her ferritin is < 30.  Ferritinhas been followed: 8 on 01/24/2017, 20 on 04/28/2017, 18 on 07/28/2017, 135 on 09/12/2017, 48 on 10/27/2017, 53 on 01/27/2018, 33 on 05/22/2018, 38 on 08/25/2018, 23 on 11/18/2018, 86 on 02/17/2019, and 32 on 05/24/2019.   She has a family history of breast cancer. Screening bilateral mammogramon 02/08/2019revealed no evidence of malignancy.  Symptomatically, she feels "ok".  She denies any bleeding.  Exam is stable.  Plan: 1.   Review labs from 05/24/2019.  2. Iron deficiency anemia Hematocrit 43.1. Hemoglobin 13.9. MCV 91.9. Ferritin 32. She denies any melena, hematochezia, or hematuria.  No Venofer today. 3. B12 deficiency B12  injections are performed at home monthly.  She notes that it has been a while since her last injection. Refill home B12 injections.  Check folate yearly (last 06/17/2018). 4.   RTC in 2 months for labs (CBC with diff, ferritin, folate).   5.   RTC in 4 months for MD assessment, labs (CBC with diff, ferritin, iron studies- day before) and +/- Venofer.  I discussed the assessment and treatment plan with the patient.  The patient was provided an opportunity to ask questions and all were answered.  The patient agreed with the plan and demonstrated an understanding of the instructions.  The patient was advised to call back if the symptoms worsen or if the condition fails to improve as anticipated.   Lequita Asal, MD, PhD    05/26/2019, 1:44 PM  I, Selena Batten, am acting as scribe for [employer]. Mike Gip, MD, PhD.  I, Jaisean Monteforte C. Mike Gip, MD, have reviewed the above documentation for accuracy and completeness, and I agree with the above.

## 2019-05-26 ENCOUNTER — Other Ambulatory Visit: Payer: Self-pay

## 2019-05-26 ENCOUNTER — Inpatient Hospital Stay (HOSPITAL_BASED_OUTPATIENT_CLINIC_OR_DEPARTMENT_OTHER): Payer: Managed Care, Other (non HMO) | Admitting: Hematology and Oncology

## 2019-05-26 ENCOUNTER — Inpatient Hospital Stay: Payer: Managed Care, Other (non HMO)

## 2019-05-26 ENCOUNTER — Encounter: Payer: Self-pay | Admitting: Hematology and Oncology

## 2019-05-26 VITALS — BP 109/61 | HR 82 | Temp 96.9°F | Resp 18 | Ht 67.5 in | Wt 250.7 lb

## 2019-05-26 DIAGNOSIS — E538 Deficiency of other specified B group vitamins: Secondary | ICD-10-CM | POA: Diagnosis not present

## 2019-05-26 DIAGNOSIS — D509 Iron deficiency anemia, unspecified: Secondary | ICD-10-CM | POA: Diagnosis not present

## 2019-05-26 NOTE — Progress Notes (Signed)
No new changes noted today 

## 2019-05-27 ENCOUNTER — Ambulatory Visit: Payer: Managed Care, Other (non HMO) | Admitting: Gastroenterology

## 2019-05-27 ENCOUNTER — Telehealth: Payer: Self-pay

## 2019-05-27 ENCOUNTER — Other Ambulatory Visit
Admission: RE | Admit: 2019-05-27 | Discharge: 2019-05-27 | Disposition: A | Discharge status: Home / Self Care | Payer: Managed Care, Other (non HMO) | Source: Ambulatory Visit | Attending: Internal Medicine | Admitting: Internal Medicine

## 2019-05-27 DIAGNOSIS — E876 Hypokalemia: Secondary | ICD-10-CM

## 2019-05-27 LAB — BASIC METABOLIC PANEL
Anion gap: 8 (ref 5–15)
BUN: 26 mg/dL — ABNORMAL HIGH (ref 6–20)
CO2: 29 mmol/L (ref 22–32)
Calcium: 9.2 mg/dL (ref 8.9–10.3)
Chloride: 99 mmol/L (ref 98–111)
Creatinine, Ser: 0.78 mg/dL (ref 0.44–1.00)
GFR calc Af Amer: >60 mL/min (ref >60)
GFR calc non Af Amer: >60 mL/min (ref >60)
Glucose, Bld: 117 mg/dL — ABNORMAL HIGH (ref 70–99)
Potassium: 3.6 mmol/L (ref 3.5–5.1)
Sodium: 136 mmol/L (ref 135–145)

## 2019-05-27 NOTE — Telephone Encounter (Signed)
Order placed for BMP.

## 2019-05-28 ENCOUNTER — Other Ambulatory Visit: Payer: Self-pay | Admitting: Internal Medicine

## 2019-05-28 ENCOUNTER — Telehealth: Payer: Self-pay | Admitting: Internal Medicine

## 2019-05-28 DIAGNOSIS — E876 Hypokalemia: Secondary | ICD-10-CM

## 2019-05-28 NOTE — Telephone Encounter (Signed)
Pt returned call. I saw she needed a lab scheduled for a recheck of potassium so I scheduled a lab appt for 06/09/19. She would like a call back regarding the lab results.

## 2019-05-28 NOTE — Progress Notes (Signed)
Order placed for f/u potassium.  

## 2019-06-09 ENCOUNTER — Other Ambulatory Visit (INDEPENDENT_AMBULATORY_CARE_PROVIDER_SITE_OTHER): Payer: Managed Care, Other (non HMO)

## 2019-06-09 ENCOUNTER — Other Ambulatory Visit: Payer: Self-pay

## 2019-06-09 DIAGNOSIS — E876 Hypokalemia: Secondary | ICD-10-CM

## 2019-06-09 LAB — POTASSIUM: Potassium: 3.7 mEq/L (ref 3.5–5.1)

## 2019-06-09 NOTE — Addendum Note (Signed)
Addended by: Leeanne Rio on: 06/09/2019 09:23 AM   Modules accepted: Orders

## 2019-06-09 NOTE — Addendum Note (Signed)
Addended by: Leeanne Rio on: 06/09/2019 09:22 AM   Modules accepted: Orders

## 2019-06-14 ENCOUNTER — Other Ambulatory Visit: Payer: Self-pay

## 2019-06-14 MED ORDER — POTASSIUM CHLORIDE CRYS ER 20 MEQ PO TBCR: 20.0000 meq | EXTENDED_RELEASE_TABLET | Freq: Two times a day (BID) | ORAL | 1 refills | Status: DC

## 2019-06-15 ENCOUNTER — Other Ambulatory Visit: Payer: Self-pay | Admitting: Internal Medicine

## 2019-06-17 ENCOUNTER — Other Ambulatory Visit: Payer: Self-pay | Admitting: Gastroenterology

## 2019-06-17 NOTE — Telephone Encounter (Signed)
Last office visit 01/26/2019 iron def anemia  Last refill Historical medication

## 2019-06-21 ENCOUNTER — Telehealth: Payer: Self-pay | Admitting: Internal Medicine

## 2019-06-21 NOTE — Telephone Encounter (Signed)
Patient stated she was feeling some better. She has slept most of the afternoon. Will call with update tomorrow and let me know if she needs anything

## 2019-06-21 NOTE — Telephone Encounter (Signed)
Pt called in and said she is on Day 2 of a migraine. She wanted an appt with Dr. Nicki Reaper to see if there is anything she could do to help like maybe a shot? I called [facility] to see if they had an availability but I did not get an answer thru back line and I was on hold for a long time for the main line. Pt said she will try calling neurologist or going to Urgent Care. I told her I would like Dr. Nicki Reaper know.

## 2019-07-18 ENCOUNTER — Other Ambulatory Visit: Payer: Self-pay | Admitting: Gastroenterology

## 2019-07-19 NOTE — Telephone Encounter (Signed)
Last office visit 01/26/2019 iron def anemia  Last refill 06/17/2019 0 refills  No appointment is scheduled  Canceled appointment 05/27/2019

## 2019-07-21 ENCOUNTER — Inpatient Hospital Stay: Payer: Managed Care, Other (non HMO) | Attending: Hematology and Oncology

## 2019-07-21 ENCOUNTER — Other Ambulatory Visit: Payer: Self-pay

## 2019-07-21 DIAGNOSIS — D509 Iron deficiency anemia, unspecified: Secondary | ICD-10-CM | POA: Diagnosis present

## 2019-07-21 DIAGNOSIS — E538 Deficiency of other specified B group vitamins: Secondary | ICD-10-CM

## 2019-07-21 LAB — CBC WITH DIFFERENTIAL/PLATELET
Abs Immature Granulocytes: 0.01 10*3/uL (ref 0.00–0.07)
Basophils Absolute: 0.1 10*3/uL (ref 0.0–0.1)
Basophils Relative: 1 %
Eosinophils Absolute: 0.3 10*3/uL (ref 0.0–0.5)
Eosinophils Relative: 4 %
HCT: 45 % (ref 36.0–46.0)
Hemoglobin: 14.8 g/dL (ref 12.0–15.0)
Immature Granulocytes: 0 %
Lymphocytes Relative: 34 %
Lymphs Abs: 2.2 10*3/uL (ref 0.7–4.0)
MCH: 29.4 pg (ref 26.0–34.0)
MCHC: 32.9 g/dL (ref 30.0–36.0)
MCV: 89.5 fL (ref 80.0–100.0)
Monocytes Absolute: 0.5 10*3/uL (ref 0.1–1.0)
Monocytes Relative: 8 %
Neutro Abs: 3.3 10*3/uL (ref 1.7–7.7)
Neutrophils Relative %: 53 %
Platelets: 267 10*3/uL (ref 150–400)
RBC: 5.03 MIL/uL (ref 3.87–5.11)
RDW: 12.2 % (ref 11.5–15.5)
WBC: 6.3 10*3/uL (ref 4.0–10.5)
nRBC: 0 % (ref 0.0–0.2)

## 2019-07-21 LAB — FERRITIN: Ferritin: 47 ng/mL (ref 11–307)

## 2019-07-21 LAB — FOLATE: Folate: 22 ng/mL (ref >5.9)

## 2019-08-02 ENCOUNTER — Telehealth: Payer: Self-pay | Admitting: Gastroenterology

## 2019-08-02 MED ORDER — PANTOPRAZOLE SODIUM 20 MG PO TBEC: 20.0000 mg | DELAYED_RELEASE_TABLET | Freq: Every day | ORAL | 0 refills | Status: DC

## 2019-08-02 NOTE — Telephone Encounter (Signed)
After reading your last office note, you had told patient to call us back if she started to need Pantoprazole again due to her GERD. Patient is requesting a refill on her Pantoprazole for a 90 day supply. Please let me know if you are okay for me to send it. Thank you!

## 2019-08-02 NOTE — Telephone Encounter (Signed)
Patient states she needs her medication pantoprazole refilled for 90 days instead of 30 so her insurance will cover.

## 2019-08-02 NOTE — Telephone Encounter (Signed)
Called patient and left her a voicemail letting her know that her prescription was sent to her [facility] in [place] for the 90 day supply she requested and okayed by Dr. Bonna Gains.

## 2019-08-05 ENCOUNTER — Other Ambulatory Visit: Payer: Self-pay

## 2019-08-05 ENCOUNTER — Ambulatory Visit: Payer: Managed Care, Other (non HMO) | Admitting: Internal Medicine

## 2019-08-05 ENCOUNTER — Encounter: Payer: Self-pay | Admitting: Internal Medicine

## 2019-08-05 VITALS — BP 122/70 | HR 92 | Temp 97.8°F | Resp 16 | Ht 68.0 in | Wt 245.0 lb

## 2019-08-05 DIAGNOSIS — F320 Major depressive disorder, single episode, mild: Secondary | ICD-10-CM

## 2019-08-05 DIAGNOSIS — Z1231 Encounter for screening mammogram for malignant neoplasm of breast: Secondary | ICD-10-CM

## 2019-08-05 DIAGNOSIS — E7800 Pure hypercholesterolemia, unspecified: Secondary | ICD-10-CM

## 2019-08-05 DIAGNOSIS — I10 Essential (primary) hypertension: Secondary | ICD-10-CM | POA: Diagnosis not present

## 2019-08-05 DIAGNOSIS — E876 Hypokalemia: Secondary | ICD-10-CM

## 2019-08-05 DIAGNOSIS — Z01818 Encounter for other preprocedural examination: Secondary | ICD-10-CM | POA: Diagnosis not present

## 2019-08-05 DIAGNOSIS — L4050 Arthropathic psoriasis, unspecified: Secondary | ICD-10-CM

## 2019-08-05 DIAGNOSIS — D509 Iron deficiency anemia, unspecified: Secondary | ICD-10-CM

## 2019-08-05 DIAGNOSIS — R609 Edema, unspecified: Secondary | ICD-10-CM

## 2019-08-05 DIAGNOSIS — D039 Melanoma in situ, unspecified: Secondary | ICD-10-CM

## 2019-08-05 DIAGNOSIS — D508 Other iron deficiency anemias: Secondary | ICD-10-CM

## 2019-08-05 DIAGNOSIS — K219 Gastro-esophageal reflux disease without esophagitis: Secondary | ICD-10-CM

## 2019-08-05 DIAGNOSIS — F32A Depression, unspecified: Secondary | ICD-10-CM

## 2019-08-05 DIAGNOSIS — L989 Disorder of the skin and subcutaneous tissue, unspecified: Secondary | ICD-10-CM

## 2019-08-05 DIAGNOSIS — E039 Hypothyroidism, unspecified: Secondary | ICD-10-CM

## 2019-08-05 LAB — BASIC METABOLIC PANEL
BUN: 16 mg/dL (ref 6–23)
CO2: 31 mEq/L (ref 19–32)
Calcium: 9.2 mg/dL (ref 8.4–10.5)
Chloride: 97 mEq/L (ref 96–112)
Creatinine, Ser: 0.7 mg/dL (ref 0.40–1.20)
GFR: 87.35 mL/min (ref >60.00)
Glucose, Bld: 100 mg/dL — ABNORMAL HIGH (ref 70–99)
Potassium: 3.7 mEq/L (ref 3.5–5.1)
Sodium: 138 mEq/L (ref 135–145)

## 2019-08-05 LAB — CBC WITH DIFFERENTIAL/PLATELET
Basophils Absolute: 0 10*3/uL (ref 0.0–0.1)
Basophils Relative: 0.8 % (ref 0.0–3.0)
Eosinophils Absolute: 0.1 10*3/uL (ref 0.0–0.7)
Eosinophils Relative: 2.7 % (ref 0.0–5.0)
HCT: 39.8 % (ref 36.0–46.0)
Hemoglobin: 13.3 g/dL (ref 12.0–15.0)
Lymphocytes Relative: 37.4 % (ref 12.0–46.0)
Lymphs Abs: 2 10*3/uL (ref 0.7–4.0)
MCHC: 33.5 g/dL (ref 30.0–36.0)
MCV: 90.1 fl (ref 78.0–100.0)
Monocytes Absolute: 0.4 10*3/uL (ref 0.1–1.0)
Monocytes Relative: 7.4 % (ref 3.0–12.0)
Neutro Abs: 2.8 10*3/uL (ref 1.4–7.7)
Neutrophils Relative %: 51.7 % (ref 43.0–77.0)
Platelets: 280 10*3/uL (ref 150.0–400.0)
RBC: 4.42 Mil/uL (ref 3.87–5.11)
RDW: 13 % (ref 11.5–15.5)
WBC: 5.5 10*3/uL (ref 4.0–10.5)

## 2019-08-05 LAB — IBC + FERRITIN
Ferritin: 52.5 ng/mL (ref 10.0–291.0)
Iron: 65 ug/dL (ref 42–145)
Saturation Ratios: 18.3 % — ABNORMAL LOW (ref 20.0–50.0)
Transferrin: 254 mg/dL (ref 212.0–360.0)

## 2019-08-05 NOTE — Progress Notes (Signed)
Patient ID: Lori Le, female   DOB: [birth year], 54 y.o.   MRN: [id number]   Subjective:    Patient ID: Lori Le, female    DOB: 05-06-1965, 54 y.o.   MRN: [id number]  HPI This visit occurred during the SARS-CoV-2 public health emergency.  Safety protocols were in place, including screening questions prior to the visit, additional usage of staff PPE, and extensive cleaning of exam room while observing appropriate contact time as indicated for disinfecting solutions.  Patient here for a scheduled follow up.  She she is here to f/u on anemia, hypokalemia and for pre op - planning to have right shoulder surgery next week.  Increased stress.  Lost her job.  Discussed with her today.  She is off zoloft.  Could not afford copay to see psychiatry.  Feels she needs to be back on the medication.  Would like for me to prescribe.  Is on disability.  Planning to have total replacement - right shoulder.  Had a pre op with anesthesia on the phone.  Planning to go tomorrow to [facility] for another pre op evaluation.  States other than her shoulder she feels she is physically doing ok.  She denies any chest pain or sob.  No acid reflux or swallowing problems reported. No abdominal pain or bowel change reported.  No headache now.  Not taking nortriptyline.  Did not tolerate.  She is taking gabapentin.  Recently evaluated by dermatology.  Had skin lesions removed from her legs.  On keflex - due to infection.  Clearing.  Decreased redness and swelling - per her report.    Past Medical History:  Diagnosis Date  . Allergic rhinitis due to allergen   . Allergy   . Anemia   . Anxiety   . Arthritis   . B12 deficiency   . Cancer ([place])    skin ca-melanoma  . Chronic idiopathic urticaria   . Complication of anesthesia    nausea, slow to wake up  . Depression   . Edema   . Family history of adverse reaction to anesthesia    Father - PONV  . Gastric ulcer   . GERD (gastroesophageal reflux disease)   . Gout   .  Gout   . Hashimoto's thyroiditis   . Heart murmur   . History of eating disorder   . History of kidney stones    h/o years ago  . Hives   . Hyperlipidemia   . Hypertension   . Hypothyroidism   . Lower extremity edema   . Melanoma in situ ([place])    left shoulder  . Migraine    approx 4x/yr-migraines  . Mitral valve disorder   . Motion sickness    all moving vehicles  . Neuropathy    fingers and feet  . PCOS (polycystic ovarian syndrome)   . PONV (postoperative nausea and vomiting)   . Psoriatic arthritis ([place])   . Thyroid disease   . Ulcer   . Vertigo    last episode over 1 yr ago   Past Surgical History:  Procedure Laterality Date  . BONE EXCISION Left 09/03/2018   Procedure: PART EXCISION BONE-PHALANX 2,3,4 LEFT;  Surgeon: Albertine Patricia, DPM;  Location: [facility];  Service: Podiatry;  Laterality: Left;  . BREAST BIOPSY Right 2007   neg- bx/clip  . CHOLECYSTECTOMY  2003  . COLONOSCOPY WITH PROPOFOL N/A 05/11/2018   Procedure: COLONOSCOPY WITH PROPOFOL;  Surgeon: [name], MD;  Location: [facility] ENDOSCOPY;  Service: Endoscopy;  Laterality: N/A;  . DILITATION & CURRETTAGE/HYSTROSCOPY WITH NOVASURE ABLATION N/A 08/19/2016   Procedure: DILATATION & CURETTAGE/HYSTEROSCOPY WITH NOVASURE ABLATION;  Surgeon: [name], MD;  Location: [facility];  Service: Gynecology;  Laterality: N/A;  . ESOPHAGOGASTRODUODENOSCOPY (EGD) WITH PROPOFOL N/A 12/05/2015   Procedure: ESOPHAGOGASTRODUODENOSCOPY (EGD) WITH PROPOFOL;  Surgeon: Lollie Sails, MD;  Location: [facility] ENDOSCOPY;  Service: Endoscopy;  Laterality: N/A;  . ESOPHAGOGASTRODUODENOSCOPY (EGD) WITH PROPOFOL N/A 02/06/2016   Procedure: ESOPHAGOGASTRODUODENOSCOPY (EGD) WITH PROPOFOL;  Surgeon: Lollie Sails, MD;  Location[facility] ENDOSCOPY;  Service: Endoscopy;  Laterality: N/A;  . ESOPHAGOGASTRODUODENOSCOPY (EGD) WITH PROPOFOL N/A 05/11/2018   Procedure: ESOPHAGOGASTRODUODENOSCOPY (EGD) WITH PROPOFOL;  Surgeon:  [name], MD;  Location: [facility] ENDOSCOPY;  Service: Endoscopy;  Laterality: N/A;  . ESOPHAGOGASTRODUODENOSCOPY (EGD) WITH PROPOFOL N/A 09/08/2018   Procedure: ESOPHAGOGASTRODUODENOSCOPY (EGD) WITH BIOPSY;  Surgeon: [name], MD;  Location: [facility];  Service: Endoscopy;  Laterality: N/A;  . FOOT ARTHRODESIS Left 09/03/2018   Procedure: ARTHRODESIS; HALLUX/IP JOINT LEFT;  Surgeon: Albertine Patricia, DPM;  Location: [facility];  Service: Podiatry;  Laterality: Left;  LMA LOCAL  . FOOT SURGERY  1994  . GASTRIC BYPASS  2011  . JOINT REPLACEMENT Left 09/27/2014   HIP  . JOINT REPLACEMENT Left 12/01/2014   KNEE  . KNEE ARTHROSCOPY Right   . KNEE ARTHROSCOPY Left 03/23/2015   Procedure: ARTHROSCOPY KNEE, partial synovectomy;  Surgeon: Hessie Knows, MD;  Location: [facility];  Service: Orthopedics;  Laterality: Left;  . LAPAROSCOPIC GASTRIC BANDING  2010  . POLYPECTOMY N/A 09/08/2018   Procedure: POLYPECTOMY;  Surgeon: [name], MD;  Location: [facility];  Service: Endoscopy;  Laterality: N/A;  Stomach  . ROTATOR CUFF REPAIR Bilateral    right shoulder 07-26-2016  . SHOULDER ARTHROSCOPY Left 06/20/2015   Procedure: ARTHROSCOPY SHOULDER, REPAIR OF MASSIVE ROTATOR CUFF TEAR, TENODESIS, DECOMPRESSION DEBRIDEMENT;  Surgeon: Corky Mull, MD;  Location: [facility];  Service: Orthopedics;  Laterality: Left;  . SHOULDER ARTHROSCOPY WITH OPEN ROTATOR CUFF REPAIR Right 12/22/2018   Procedure: SHOULDER ARTHROSCOPY WITH OPEN ROTATOR CUFF REPAIR;  Surgeon: Corky Mull, MD;  Location: [facility];  Service: Orthopedics;  Laterality: Right;  . TOTAL HIP ARTHROPLASTY Left 09/27/2014   Procedure: TOTAL HIP ARTHROPLASTY ANTERIOR APPROACH;  Surgeon: Hessie Knows, MD;  Location: [facility];  Service: Orthopedics;  Laterality: Left;  . TOTAL KNEE ARTHROPLASTY Left 12/01/2014   Procedure: TOTAL KNEE ARTHROPLASTY;  Surgeon: Hessie Knows, MD;  Location: [facility];  Service:  Orthopedics;  Laterality: Left;   Family History  Problem Relation Age of Onset  . Arthritis Mother   . Hyperlipidemia Mother   . Hypertension Mother   . Diabetes Mother   . Sleep apnea Mother   . Obesity Mother   . Arthritis Father   . Hyperlipidemia Father   . Hypertension Father   . Mental illness Father   . Diabetes Father   . Obesity Father   . Sleep apnea Father   . Bipolar disorder Father   . Anxiety disorder Father   . Depression Father   . Cancer Father   . Kidney disease Father   . Arthritis Maternal Grandmother   . Cancer Maternal Grandmother        breast cancer  . Hyperlipidemia Maternal Grandmother   . Hypertension Maternal Grandmother   . Breast cancer Maternal Grandmother   . Arthritis Maternal Grandfather   . Hyperlipidemia Maternal Grandfather   . Hypertension  Maternal Grandfather   . Heart disease Maternal Grandfather        heart attack  . Breast cancer Paternal Grandmother    Social History   Socioeconomic History  . Marital status: Divorced    Spouse name: Not on file  . Number of children: Not on file  . Years of education: Not on file  . Highest education level: Not on file  Occupational History  . Occupation: Chiropractor  Tobacco Use  . Smoking status: Never Smoker  . Smokeless tobacco: Never Used  Substance and Sexual Activity  . Alcohol use: Yes    Alcohol/week: 0.0 - 1.0 standard drinks    Comment: occ.  . Drug use: No  . Sexual activity: Yes    Partners: Male    Birth control/protection: Surgical    Comment: Ablation   Other Topics Concern  . Not on file  Social History Narrative  . Not on file   Social Determinants of Health   Financial Resource Strain:   . Difficulty of Paying Living Expenses:   Food Insecurity:   . Worried About Charity fundraiser in the Last Year:   . Arboriculturist in the Last Year:   Transportation Needs:   . Film/video editor (Medical):   [name] Lack of Transportation (Non-Medical):     Physical Activity:   . Days of Exercise per Week:   . Minutes of Exercise per Session:   Stress:   . Feeling of Stress :   Social Connections:   . Frequency of Communication with Friends and Family:   . Frequency of Social Gatherings with Friends and Family:   . Attends Religious Services:   . Active Member of Clubs or Organizations:   . Attends Archivist Meetings:   [name] Marital Status:     Outpatient Encounter Medications as of 08/05/2019  Medication Sig  . clobetasol ointment (TEMOVATE) 1.97 % Apply 1 application topically at bedtime as needed (inflammation).   . COSENTYX SENSOREADY, 300 MG, 150 MG/ML SOAJ   . EPINEPHrine 0.3 mg/0.3 mL IJ SOAJ injection Inject 0.3 mLs (0.3 mg total) into the muscle as needed for anaphylaxis.  . furosemide (LASIX) 40 MG tablet Take 1 tablet (40 mg total) by mouth daily.  [name] gabapentin (NEURONTIN) 300 MG capsule 300 mg 2 (two) times daily.   . Guselkumab (TREMFYA) 100 MG/ML SOPN Inject into the skin.  [name] levothyroxine (SYNTHROID) 137 MCG tablet SMARTSIG:1 Tablet(s) By Mouth Daily  . metFORMIN (GLUCOPHAGE) 500 MG tablet Take 1 tablet (500 mg total) by mouth daily with breakfast.  . montelukast (SINGULAIR) 10 MG tablet Take 10 mg by mouth at bedtime.  . pantoprazole (PROTONIX) 20 MG tablet Take 1 tablet (20 mg total) by mouth daily.  . potassium chloride SA (KLOR-CON) 20 MEQ tablet Take 1 tablet (20 mEq total) by mouth 2 (two) times daily.  . sertraline (ZOLOFT) 50 MG tablet Take 1 tablet (50 mg total) by mouth daily.  [name] triamterene-hydrochlorothiazide (MAXZIDE-25) 37.5-25 MG tablet Take 1 tablet by mouth daily.  . [DISCONTINUED] sertraline (ZOLOFT) 100 MG tablet Take 1 tablet (100 mg total) by mouth every morning.   Facility-Administered Encounter Medications as of 08/05/2019  Medication  . 0.9 %  sodium chloride infusion  . cyanocobalamin ((VITAMIN B-12)) injection 1,000 mcg    Review of Systems  Constitutional: Negative for appetite change  and unexpected weight change.  HENT: Negative for congestion and sinus pressure.   Respiratory: Negative for cough,  chest tightness and shortness of breath.   Cardiovascular: Negative for chest pain and palpitations.       Reports decreased leg swelling.   Gastrointestinal: Negative for abdominal pain, diarrhea, nausea and vomiting.  Genitourinary: Negative for difficulty urinating and dysuria.  Musculoskeletal: Negative for joint swelling and myalgias.       Increased pain - right shoulder.   Skin: Negative for color change and rash.  Neurological: Negative for dizziness, light-headedness and headaches.  Psychiatric/Behavioral: Negative for agitation and dysphoric mood.       Increased stress as outlined.         Objective:    Physical Exam Vitals reviewed.  Constitutional:      General: She is not in acute distress.    Appearance: Normal appearance.  HENT:     Head: Normocephalic and atraumatic.     Right Ear: External ear normal.     Left Ear: External ear normal.  Eyes:     General: No scleral icterus.       Right eye: No discharge.        Left eye: No discharge.     Conjunctiva/sclera: Conjunctivae normal.  Neck:     Thyroid: No thyromegaly.  Cardiovascular:     Rate and Rhythm: Normal rate and regular rhythm.  Pulmonary:     Effort: No respiratory distress.     Breath sounds: Normal breath sounds. No wheezing.  Abdominal:     General: Bowel sounds are normal.     Palpations: Abdomen is soft.     Tenderness: There is no abdominal tenderness.  Musculoskeletal:        General: No swelling or tenderness.     Cervical back: Neck supple. No tenderness.  Lymphadenopathy:     Cervical: No cervical adenopathy.  Skin:    Findings: No erythema or rash.     Comments: Healing - skin lesions - lower extremities.  No increased erythema.   Neurological:     Mental Status: She is alert.  Psychiatric:        Mood and Affect: Mood normal.        Behavior: Behavior normal.      BP 122/70   Pulse 92   Temp 97.8 F (36.6 C)   Resp 16   Ht '5\' 8"'  (1.727 m)   Wt 245 lb (111.1 kg)   SpO2 99%   BMI 37.25 kg/m  Wt Readings from Last 3 Encounters:  08/05/19 245 lb (111.1 kg)  05/26/19 250 lb 10.6 oz (113.7 kg)  05/07/19 240 lb (108.9 kg)     Lab Results  Component Value Date   WBC 5.5 08/05/2019   HGB 13.3 08/05/2019   HCT 39.8 08/05/2019   PLT 280.0 08/05/2019   GLUCOSE 100 (H) 08/05/2019   CHOL 242 (H) 05/10/2019   TRIG 211 (H) 05/10/2019   HDL 56 05/10/2019   LDLDIRECT 106.0 05/26/2018   LDLCALC 148 (H) 05/10/2019   ALT 9 05/10/2019   AST 8 05/10/2019   NA 138 08/05/2019   K 3.7 08/05/2019   CL 97 08/05/2019   CREATININE 0.70 08/05/2019   BUN 16 08/05/2019   CO2 31 08/05/2019   TSH 0.57 05/26/2018   INR 0.99 03/08/2016   HGBA1C 5.9 (H) 10/27/2018       Assessment & Plan:   Problem List Items Addressed This Visit    Anemia    Check cbc and iron studies today.        Relevant Orders  CBC with Differential/Platelet (Completed)   IBC + Ferritin (Completed)   Edema    Reports swelling improved.  Follow.        Essential hypertension, benign    Blood pressure has been doing well.  On triam/hctz and takes lasix.  Have discuss trying to decrease. See previous notes.  Check met b to confirm wnl.       Relevant Orders   Basic metabolic panel (Completed)   GERD (gastroesophageal reflux disease)    No problems reports.  On protonix.        Hypercholesterolemia    Low cholesterol diet and exercise.  Follow lipid panel.       Hypokalemia    Has had issues with low potassium.  Taking her potassium supplements as outlined.  Recheck electrolytes today to confirm wnl prior to surgery.        Hypothyroidism    On thyroid replacement.  Follow tsh.       Iron deficiency anemia following bariatric surgery    Has been evaluated by hematology.  Has previously received iron infusions.  Check cbc and iron levels.        Melanoma  in situ [facility])    Followed by dermatology.       Mild depression ([place])    Off zoloft.  Was seeing psychiatry.  Unable to afford co pay.  Will restart zoloft at 25m q day.  Follow.        Relevant Medications   sertraline (ZOLOFT) 50 MG tablet   Pre-op evaluation    Planning for total replacement - right shoulder - next week.  She denies any chest pain or sob.  EKG - SR with no acute ischemic changes.  Will need close intra op and post op monitoring of blood pressure to avoid extremes.  As noted, needs to confirm with prescribing physician if any adjustments need to be made in cosentyx dosing - prior to surgery. She also has recently had skin lesions removed - leg.  Apparently became infected.  On keflex now.  Reports decreased swelling and redness.  She plans to discuss with ortho - to make them aware of infection prior to surgery (to confirm clear - prior to surgery).        Relevant Orders   EKG 12-Lead   Psoriatic arthritis ([place]    On cosentyx.  She will need to discuss with prescribing physician  if needs adjustment prior to her surgery.        Skin lesions    Saw dermatology and had lesions removed - leg.  Apparently became infected.  Was placed on keflex - by dermatology.  Reports decreased swelling and redness.  Discussed importance of discussing with ortho - to confirm infection clears prior to surgery.  Complete keflex.        Other Visit Diagnoses    Visit for screening mammogram    -  Primary   Relevant Orders   MM 3D SCREEN BREAST BILATERAL       CEinar Pheasant MD

## 2019-08-06 ENCOUNTER — Telehealth: Payer: Self-pay

## 2019-08-06 ENCOUNTER — Encounter: Payer: Self-pay | Admitting: Internal Medicine

## 2019-08-06 DIAGNOSIS — L989 Disorder of the skin and subcutaneous tissue, unspecified: Secondary | ICD-10-CM | POA: Insufficient documentation

## 2019-08-06 MED ORDER — SERTRALINE HCL 50 MG PO TABS: 50.0000 mg | ORAL_TABLET | Freq: Every day | ORAL | 2 refills | Status: DC

## 2019-08-06 NOTE — Assessment & Plan Note (Signed)
Has had issues with low potassium.  Taking her potassium supplements as outlined.  Recheck electrolytes today to confirm wnl prior to surgery.

## 2019-08-06 NOTE — Assessment & Plan Note (Signed)
Followed by dermatology

## 2019-08-06 NOTE — Assessment & Plan Note (Signed)
On thyroid replacement.  Follow tsh.  

## 2019-08-06 NOTE — Assessment & Plan Note (Signed)
No problems reports.  On protonix.

## 2019-08-06 NOTE — Assessment & Plan Note (Signed)
Off zoloft.  Was seeing psychiatry.  Unable to afford co pay.  Will restart zoloft at 50mg  q day.  Follow.

## 2019-08-06 NOTE — Assessment & Plan Note (Signed)
Has been evaluated by hematology.  Has previously received iron infusions.  Check cbc and iron levels.

## 2019-08-06 NOTE — Assessment & Plan Note (Signed)
Low cholesterol diet and exercise.  Follow lipid panel.   

## 2019-08-06 NOTE — Assessment & Plan Note (Signed)
Saw dermatology and had lesions removed - leg.  Apparently became infected.  Was placed on keflex - by dermatology.  Reports decreased swelling and redness.  Discussed importance of discussing with ortho - to confirm infection clears prior to surgery.  Complete keflex.

## 2019-08-06 NOTE — Assessment & Plan Note (Signed)
Blood pressure has been doing well.  On triam/hctz and takes lasix.  Have discuss trying to decrease. See previous notes.  Check met b to confirm wnl.

## 2019-08-06 NOTE — Assessment & Plan Note (Signed)
Planning for total replacement - right shoulder - next week.  She denies any chest pain or sob.  EKG - SR with no acute ischemic changes.  Will need close intra op and post op monitoring of blood pressure to avoid extremes.  As noted, needs to confirm with prescribing physician if any adjustments need to be made in cosentyx dosing - prior to surgery. She also has recently had skin lesions removed - leg.  Apparently became infected.  On keflex now.  Reports decreased swelling and redness.  She plans to discuss with ortho - to make them aware of infection prior to surgery (to confirm clear - prior to surgery).

## 2019-08-06 NOTE — Telephone Encounter (Signed)
Left message for patient to call back regarding pre-op instructions. Need medication clarification

## 2019-08-06 NOTE — Assessment & Plan Note (Signed)
Reports swelling improved.  Follow.

## 2019-08-06 NOTE — Assessment & Plan Note (Addendum)
On cosentyx.  She will need to discuss with prescribing physician  if needs adjustment prior to her surgery.

## 2019-08-06 NOTE — Assessment & Plan Note (Signed)
Check cbc and iron studies today.

## 2019-08-18 DIAGNOSIS — Z96611 Presence of right artificial shoulder joint: Secondary | ICD-10-CM | POA: Insufficient documentation

## 2019-08-25 ENCOUNTER — Encounter: Payer: Self-pay | Admitting: Internal Medicine

## 2019-08-25 DIAGNOSIS — L4050 Arthropathic psoriasis, unspecified: Secondary | ICD-10-CM

## 2019-08-25 NOTE — Telephone Encounter (Signed)
Order placed for rheumatology referral.  

## 2019-08-31 DIAGNOSIS — R519 Headache, unspecified: Secondary | ICD-10-CM | POA: Insufficient documentation

## 2019-09-13 ENCOUNTER — Encounter: Payer: Self-pay | Admitting: Internal Medicine

## 2019-09-15 ENCOUNTER — Ambulatory Visit
Admission: RE | Admit: 2019-09-15 | Discharge: 2019-09-15 | Disposition: A | Discharge status: Home / Self Care | Payer: Managed Care, Other (non HMO) | Source: Ambulatory Visit | Attending: Internal Medicine | Admitting: Internal Medicine

## 2019-09-15 ENCOUNTER — Other Ambulatory Visit: Payer: Self-pay

## 2019-09-15 VITALS — BP 133/76 | HR 97 | Temp 98.0°F | Resp 16

## 2019-09-15 DIAGNOSIS — H8110 Benign paroxysmal vertigo, unspecified ear: Secondary | ICD-10-CM

## 2019-09-15 MED ORDER — MECLIZINE HCL 25 MG PO TABS: 25.0000 mg | ORAL_TABLET | Freq: Three times a day (TID) | ORAL | 0 refills | Status: AC

## 2019-09-15 NOTE — ED Triage Notes (Signed)
Pt c/o dizziness when laying flat and turning head from side to side or from laying flat to sitting up. Dull headache. No OTC meds tried.

## 2019-09-15 NOTE — Discharge Instructions (Signed)
Keep you appointment with ENT

## 2019-09-15 NOTE — ED Provider Notes (Signed)
[facility]    CSN: [id number] Arrival date & time: 09/15/19  1655      History   Chief Complaint Chief Complaint  Patient presents with  . Dizziness    HPI Lori Le is a 54 y.o. female. who presents with vertigo worse with turining her head to the side and once when she stood up yesterday. She has been drinking well. Has had mild vertigo in the past, but lasted all day, unlike this time it is only positional. She has ENT apt in 3 days, but cant wait til then.     Past Medical History:  Diagnosis Date  . Allergic rhinitis due to allergen   . Allergy   . Anemia   . Anxiety   . Arthritis   . B12 deficiency   . Cancer ([place])    skin ca-melanoma  . Chronic idiopathic urticaria   . Complication of anesthesia    nausea, slow to wake up  . Depression   . Edema   . Family history of adverse reaction to anesthesia    Father - PONV  . Gastric ulcer   . GERD (gastroesophageal reflux disease)   . Gout   . Gout   . Hashimoto's thyroiditis   . Heart murmur   . History of eating disorder   . History of kidney stones    h/o years ago  . Hives   . Hyperlipidemia   . Hypertension   . Hypothyroidism   . Lower extremity edema   . Melanoma in situ ([place])    left shoulder  . Migraine    approx 4x/yr-migraines  . Mitral valve disorder   . Motion sickness    all moving vehicles  . Neuropathy    fingers and feet  . PCOS (polycystic ovarian syndrome)   . PONV (postoperative nausea and vomiting)   . Psoriatic arthritis ([place])   . Thyroid disease   . Ulcer   . Vertigo    last episode over 1 yr ago    Patient Active Problem List   Diagnosis Date Noted  . Skin lesions 08/06/2019  . Pre-op evaluation 08/05/2019  . Psoriatic arthritis ([place]) 01/03/2019  . Rash 10/25/2018  . Gastric polyp   . Acute gastrojejunal ulcer without hemorrhage or perforation   . Toe pain 08/30/2018  . Hypokalemia 08/30/2018  . Constipation 08/30/2018  . UTI (urinary tract  infection) 08/30/2018  . History of colon polyps 05/13/2018  . Health care maintenance   . Benign neoplasm of cecum   . Benign neoplasm of transverse colon   . CLE (columnar lined esophagus)   . Gastrointestinal ulcer   . Hot flashes 01/12/2018  . Chronic venous insufficiency 12/18/2017  . Lymphedema 12/18/2017  . Iron deficiency anemia following bariatric surgery 10/28/2017  . Bilateral lower extremity edema 10/07/2017  . Hyperglycemia 03/23/2017  . Rotator cuff syndrome 04/24/2015  . Arthritis of knee, degenerative 04/24/2015  . Degenerative arthritis of hip 04/24/2015  . B12 deficiency 01/06/2015  . Primary osteoarthritis of knee 12/01/2014  . Anemia 11/22/2014  . Primary localized osteoarthritis of left hip 09/27/2014  . Gout 03/20/2014  . Excessive and frequent menstruation 08/10/2013  . Derangement of posterior horn of medial meniscus 08/10/2013  . Mitral valve disease 08/10/2013  . Edema 12/30/2012  . Essential hypertension, benign 08/02/2012  . Melanoma in situ ([place]) 08/02/2012  . Mild depression ([place]) 08/02/2012  . Environmental allergies 08/02/2012  . Hypercholesterolemia 08/02/2012  . Migraines 08/02/2012  .  Hypothyroidism 08/02/2012  . PCOS (polycystic ovarian syndrome) 08/02/2012  . GERD (gastroesophageal reflux disease) 08/02/2012  . Intestinal bypass or anastomosis status 08/02/2012  . Food allergy 07/19/2011    Past Surgical History:  Procedure Laterality Date  . BONE EXCISION Left 09/03/2018   Procedure: PART EXCISION BONE-PHALANX 2,3,4 LEFT;  Surgeon: Albertine Patricia, DPM;  Location: [facility];  Service: Podiatry;  Laterality: Left;  . BREAST BIOPSY Right 2007   neg- bx/clip  . CHOLECYSTECTOMY  2003  . COLONOSCOPY WITH PROPOFOL N/A 05/11/2018   Procedure: COLONOSCOPY WITH PROPOFOL;  Surgeon: [name], MD;  Location: [facility];  Service: Endoscopy;  Laterality: N/A;  . DILITATION & CURRETTAGE/HYSTROSCOPY WITH NOVASURE ABLATION  N/A 08/19/2016   Procedure: DILATATION & CURETTAGE/HYSTEROSCOPY WITH NOVASURE ABLATION;  Surgeon: [name], MD;  Location: [facility];  Service: Gynecology;  Laterality: N/A;  . ESOPHAGOGASTRODUODENOSCOPY (EGD) WITH PROPOFOL N/A 12/05/2015   Procedure: ESOPHAGOGASTRODUODENOSCOPY (EGD) WITH PROPOFOL;  Surgeon: Lollie Sails, MD;  Location: [facility]COPY;  Service: Endoscopy;  Laterality: N/A;  . ESOPHAGOGASTRODUODENOSCOPY (EGD) WITH PROPOFOL N/A 02/06/2016   Procedure: ESOPHAGOGASTRODUODENOSCOPY (EGD) WITH PROPOFOL;  Surgeon: Lollie Sails, MD;  Location: [facility] ENDOSCOPY;  Service: Endoscopy;  Laterality: N/A;  . ESOPHAGOGASTRODUODENOSCOPY (EGD) WITH PROPOFOL N/A 05/11/2018   Procedure: ESOPHAGOGASTRODUODENOSCOPY (EGD) WITH PROPOFOL;  Surgeon: [name], MD;  Location: [facility]COPY;  Service: Endoscopy;  Laterality: N/A;  . ESOPHAGOGASTRODUODENOSCOPY (EGD) WITH PROPOFOL N/A 09/08/2018   Procedure: ESOPHAGOGASTRODUODENOSCOPY (EGD) WITH BIOPSY;  Surgeon: [name], MD;  Location: [place] [facility];  Service: Endoscopy;  Laterality: N/A;  . FOOT ARTHRODESIS Left 09/03/2018   Procedure: ARTHRODESIS; HALLUX/IP JOINT LEFT;  Surgeon: Albertine Patricia, DPM;  Location: [place];  Service: Podiatry;  Laterality: Left;  LMA LOCAL  . FOOT SURGERY  1994  . GASTRIC BYPASS  2011  . JOINT REPLACEMENT Left 09/27/2014   HIP  . JOINT REPLACEMENT Left 12/01/2014   KNEE  . KNEE ARTHROSCOPY Right   . KNEE ARTHROSCOPY Left 03/23/2015   Procedure: ARTHROSCOPY KNEE, partial synovectomy;  Surgeon: Hessie Knows, MD;  Location: [facility];  Service: Orthopedics;  Laterality: Left;  . LAPAROSCOPIC GASTRIC BANDING  2010  . POLYPECTOMY N/A 09/08/2018   Procedure: POLYPECTOMY;  Surgeon: [name], MD;  Location: [facility];  Service: Endoscopy;  Laterality: N/A;  Stomach  . ROTATOR CUFF REPAIR Bilateral    right shoulder 07-26-2016  . SHOULDER ARTHROSCOPY Left 06/20/2015    Procedure: ARTHROSCOPY SHOULDER, REPAIR OF MASSIVE ROTATOR CUFF TEAR, TENODESIS, DECOMPRESSION DEBRIDEMENT;  Surgeon: Corky Mull, MD;  Location: [facility];  Service: Orthopedics;  Laterality: Left;  . SHOULDER ARTHROSCOPY WITH OPEN ROTATOR CUFF REPAIR Right 12/22/2018   Procedure: SHOULDER ARTHROSCOPY WITH OPEN ROTATOR CUFF REPAIR;  Surgeon: Corky Mull, MD;  Location: [facility];  Service: Orthopedics;  Laterality: Right;  . TOTAL HIP ARTHROPLASTY Left 09/27/2014   Procedure: TOTAL HIP ARTHROPLASTY ANTERIOR APPROACH;  Surgeon: Hessie Knows, MD;  Location: [facility];  Service: Orthopedics;  Laterality: Left;  . TOTAL KNEE ARTHROPLASTY Left 12/01/2014   Procedure: TOTAL KNEE ARTHROPLASTY;  Surgeon: Hessie Knows, MD;  Location: [facility];  Service: Orthopedics;  Laterality: Left;    OB History    Gravida  0   Para  0   Term  0   Preterm  0   AB  0   Living  0     SAB  0   TAB  0   Ectopic  0   Multiple  0   Live Births  0            Home Medications    Prior to Admission medications   Medication Sig Start Date End Date Taking? Authorizing Provider  clobetasol ointment (TEMOVATE) 1.82 % Apply 1 application topically at bedtime as needed (inflammation).  10/22/18   [provider]  COSENTYX SENSOREADY, 300 MG, 150 MG/ML SOAJ  12/28/18   [provider]  EPINEPHrine 0.3 mg/0.3 mL IJ SOAJ injection Inject 0.3 mLs (0.3 mg total) into the muscle as needed for anaphylaxis. 12/31/18   Eber Jones, MD  furosemide (LASIX) 40 MG tablet Take 1 tablet (40 mg total) by mouth daily. 05/18/19   Einar Pheasant, MD  gabapentin (NEURONTIN) 300 MG capsule 300 mg 2 (two) times daily.  10/21/18   [provider]  Guselkumab (TREMFYA) 100 MG/ML SOPN Inject into the skin.    [provider]  levothyroxine (SYNTHROID) 137 MCG tablet SMARTSIG:1 Tablet(s) By Mouth Daily 08/06/18   [provider]  meclizine (ANTIVERT) 25 MG tablet Take 1 tablet (25  mg total) by mouth 3 (three) times daily for 7 days. 09/15/19 09/22/19  Rodriguez-Southworth, Sunday Spillers, PA-C  metFORMIN (GLUCOPHAGE) 500 MG tablet Take 1 tablet (500 mg total) by mouth daily with breakfast. 12/31/18 05/26/19  Eber Jones, MD  montelukast (SINGULAIR) 10 MG tablet Take 10 mg by mouth at bedtime.    [provider]  pantoprazole (PROTONIX) 20 MG tablet Take 1 tablet (20 mg total) by mouth daily. 08/02/19   [name], MD  potassium chloride SA (KLOR-CON) 20 MEQ tablet Take 1 tablet (20 mEq total) by mouth 2 (two) times daily. 06/14/19   Einar Pheasant, MD  sertraline (ZOLOFT) 50 MG tablet Take 1 tablet (50 mg total) by mouth daily. 08/06/19   Einar Pheasant, MD  triamterene-hydrochlorothiazide (MAXZIDE-25) 37.5-25 MG tablet Take 1 tablet by mouth daily. 05/18/19   Einar Pheasant, MD    Family History Family History  Problem Relation Age of Onset  . Arthritis Mother   . Hyperlipidemia Mother   . Hypertension Mother   . Diabetes Mother   . Sleep apnea Mother   . Obesity Mother   . Arthritis Father   . Hyperlipidemia Father   . Hypertension Father   . Mental illness Father   . Diabetes Father   . Obesity Father   . Sleep apnea Father   . Bipolar disorder Father   . Anxiety disorder Father   . Depression Father   . Cancer Father   . Kidney disease Father   . Arthritis Maternal Grandmother   . Cancer Maternal Grandmother        breast cancer  . Hyperlipidemia Maternal Grandmother   . Hypertension Maternal Grandmother   . Breast cancer Maternal Grandmother   . Arthritis Maternal Grandfather   . Hyperlipidemia Maternal Grandfather   . Hypertension Maternal Grandfather   . Heart disease Maternal Grandfather        heart attack  . Breast cancer Paternal Grandmother     Social History Social History   Tobacco Use  . Smoking status: Never Smoker  . Smokeless tobacco: Never Used  Vaping Use  . Vaping Use: Never used  Substance Use Topics  .  Alcohol use: Yes    Alcohol/week: 0.0 - 1.0 standard drinks    Comment: occ.  . Drug use: No     Allergies   Contrast media [iodinated diagnostic agents],  Etodolac, Alpha-gal, Influenza vaccines, Lactose, Lactose intolerance (gi), Bacitracin-neomycin-polymyxin, Neomycin-bacitracin zn-polymyx, and Neosporin + pain relief max st [neomy-bacit-polymyx-pramoxine]   Review of Systems Review of Systems + for vertigo, R shoulder pain from shoulder surgery, the rest of 10 point ROS is neg  Physical Exam Triage Vital Signs ED Triage Vitals  Enc Vitals Group     BP 09/15/19 1708 133/76     Pulse Rate 09/15/19 1706 97     Resp 09/15/19 1706 16     Temp 09/15/19 1706 98 F (36.7 C)     Temp src --      SpO2 09/15/19 1706 100 %     Weight --      Height --      Head Circumference --      Peak Flow --      Pain Score 09/15/19 1707 2     Pain Loc --      Pain Edu? --      Excl. in [place]? --    Orthostatic VS for the past 24 hrs:  BP- Lying Pulse- Lying BP- Sitting Pulse- Sitting BP- Standing at 0 minutes Pulse- Standing at 0 minutes  09/15/19 1800 118/76 81 113/73 91 117/81 104    Updated Vital Signs BP 133/76   Pulse 97   Temp 98 F (36.7 C)   Resp 16   SpO2 100%   Visual Acuity Right Eye Distance:   Left Eye Distance:   Bilateral Distance:    Right Eye Near:   Left Eye Near:    Bilateral Near:     Physical Exam Physical Exam Vitals signs and nursing note reviewed.  Constitutional:      General: He is not in acute distress.    Appearance: He is well-developed and normal weight. He is not ill-appearing, toxic-appearing or diaphoretic.  HENT: Tm's gray and shiny, Nose with normal mucosa. Pharynx is clear.     Head: Normocephalic.  Eyes:     Extraocular Movements: Extraocular movements intact.     Pupils: Pupils are equal, round, and reactive to light.  Neck:     Musculoskeletal: Neck supple. No neck rigidity.     Meningeal: Brudzinski's sign absent.  Cardiovascular:      Rate and Rhythm: Normal rate and regular rhythm.     Heart sounds: No murmur.  Pulmonary:     Effort: Pulmonary effort is normal.     Breath sounds: Normal breath sounds. No wheezing, rhonchi or rales.  Abdominal:     General: Bowel sounds are normal.     Palpations: Abdomen is soft. There is no mass.     Tenderness: There is no abdominal tenderness. There is no guarding.  Musculoskeletal: Normal range of motion.  Lymphadenopathy:     Cervical: No cervical adenopathy.  Skin:    General: Skin is warm and dry.  Neurological:     Mental Status: He is alert.     Cranial Nerves: No cranial nerve deficit or facial asymmetry.     Sensory: No sensory deficit.     Motor: No weakness.     Coordination: Romberg sign negative. Coordination normal.     Gait: Gait normal.     Deep Tendon Reflexes: Reflexes normal.     Comments: Normal Romberg She felt vertigo after laying down for a few seconds from sitting down.    Psychiatric:        Mood and Affect: Mood normal.        Speech: Speech  normal.        Behavior: Behavior normal.     UC Treatments / Results  Labs (all labs ordered are listed, but only abnormal results are displayed) Labs Reviewed - No data to display  EKG   Radiology No results found.  Procedures Procedures (including critical care time)  Medications Ordered in UC Medications - No data to display  Initial Impression / Assessment and Plan / UC Course  I have reviewed the triage vital signs and the nursing notes. She was placed on Antivert as noted and needs to keep apt with ENT on Friday as schedule.d  Final Clinical Impressions(s) / UC Diagnoses   Final diagnoses:  Benign paroxysmal positional vertigo, unspecified laterality     Discharge Instructions     Keep you appointment with ENT    ED Prescriptions    Medication Sig Dispense Auth. Provider   meclizine (ANTIVERT) 25 MG tablet Take 1 tablet (25 mg total) by mouth 3 (three) times daily for 7  days. 21 tablet Rodriguez-Southworth, Sunday Spillers, PA-C     PDMP not reviewed this encounter.   Shelby Mattocks, Hershal Coria 09/15/19 2156

## 2019-09-21 ENCOUNTER — Other Ambulatory Visit: Payer: Managed Care, Other (non HMO)

## 2019-09-22 ENCOUNTER — Inpatient Hospital Stay: Payer: Managed Care, Other (non HMO)

## 2019-09-22 ENCOUNTER — Inpatient Hospital Stay: Payer: Managed Care, Other (non HMO) | Attending: Hematology and Oncology

## 2019-09-22 ENCOUNTER — Ambulatory Visit: Payer: Managed Care, Other (non HMO)

## 2019-09-22 ENCOUNTER — Ambulatory Visit: Payer: Managed Care, Other (non HMO) | Admitting: Hematology and Oncology

## 2019-09-22 ENCOUNTER — Other Ambulatory Visit: Payer: Self-pay

## 2019-09-22 DIAGNOSIS — Z9884 Bariatric surgery status: Secondary | ICD-10-CM | POA: Diagnosis not present

## 2019-09-22 DIAGNOSIS — E538 Deficiency of other specified B group vitamins: Secondary | ICD-10-CM | POA: Diagnosis not present

## 2019-09-22 DIAGNOSIS — Z96641 Presence of right artificial hip joint: Secondary | ICD-10-CM | POA: Insufficient documentation

## 2019-09-22 DIAGNOSIS — R779 Abnormality of plasma protein, unspecified: Secondary | ICD-10-CM | POA: Diagnosis not present

## 2019-09-22 DIAGNOSIS — D509 Iron deficiency anemia, unspecified: Secondary | ICD-10-CM | POA: Diagnosis present

## 2019-09-22 LAB — CBC WITH DIFFERENTIAL/PLATELET
Abs Immature Granulocytes: 0.01 10*3/uL (ref 0.00–0.07)
Basophils Absolute: 0.1 10*3/uL (ref 0.0–0.1)
Basophils Relative: 1 %
Eosinophils Absolute: 0.1 10*3/uL (ref 0.0–0.5)
Eosinophils Relative: 2 %
HCT: 39 % (ref 36.0–46.0)
Hemoglobin: 12.6 g/dL (ref 12.0–15.0)
Immature Granulocytes: 0 %
Lymphocytes Relative: 29 %
Lymphs Abs: 1.7 10*3/uL (ref 0.7–4.0)
MCH: 28.6 pg (ref 26.0–34.0)
MCHC: 32.3 g/dL (ref 30.0–36.0)
MCV: 88.4 fL (ref 80.0–100.0)
Monocytes Absolute: 0.5 10*3/uL (ref 0.1–1.0)
Monocytes Relative: 8 %
Neutro Abs: 3.5 10*3/uL (ref 1.7–7.7)
Neutrophils Relative %: 60 %
Platelets: 317 10*3/uL (ref 150–400)
RBC: 4.41 MIL/uL (ref 3.87–5.11)
RDW: 12.4 % (ref 11.5–15.5)
WBC: 6 10*3/uL (ref 4.0–10.5)
nRBC: 0 % (ref 0.0–0.2)

## 2019-09-22 LAB — IRON AND TIBC
Iron: 56 ug/dL (ref 28–170)
Saturation Ratios: 17 % (ref 10.4–31.8)
TIBC: 339 ug/dL (ref 250–450)
UIBC: 283 ug/dL

## 2019-09-22 LAB — FERRITIN: Ferritin: 20 ng/mL (ref 11–307)

## 2019-09-22 NOTE — Progress Notes (Signed)
[facility]  [address], [address] [place], [place] [address] Phone: [phone]  Fax: [phone]   Clinic Day:  09/23/2019  Referring physician: Einar Pheasant, MD  Chief Complaint: Lori Le is a 54 y.o. female with s/p gastric bypass surgery and subsequent iron deficiency and B12 deficiency who is seen for a 4 month assessment.  HPI: The patient was last seen in the hematology clinic on 05/25/2018. At that time, she felt "ok".  She denied any bleeding.  Exam was stable.  Home B12 injections were ferilled. Hematocrit was 43.1, hemoglobin 13.9, platelets 305,000, WBC 7,000. Ferritin was 32 with an iron saturation of 15% and a TIBC of 340. She did not receive Venofer.  She had a total right shoulder arthroplasty on 08/11/2019.  Labs followed: 07/21/2019: Hematocrit 45.0, hemoglobin 14.8, platelets 267,000, WBC 6,300. Ferritin 47.  Folate 22. 08/05/2019: Hematocrit 39.8, hemoglobin 13.3, platelets 280,000, WBC 5,500. Ferritin 52.5 with an iron saturation of 18.3%. 09/22/2019: Hematocrit 39.0, hemoglobin 12.6, platelets 317,000, WBC 6,000. Ferritin 20 with an iron saturation of 17%.  Additional [facility] labs: 09/15/2019: Hepatitis C antibody, Hepatitis B core antibody, Hepatitis B surface antigen, and Hepatitis B surface antibody were negative. SPEP and FLC ratio were normal.  Sed rate was 41.  The patient has a mammogram scheduled for 10/05/2019.  During the interim, she has been okay. She stopped her home B12 injections because she ran out about a month ago. She cannot tell a difference since stopping. Denies ice pica. The patient has been doing well since her shoulder replacement and has minimal pain. She would like to receive Venofer next week.  She has not received her COVID-19 vaccines and is unsure if she wants to.  Past Medical History:  Diagnosis Date  . Allergic rhinitis due to allergen   . Allergy   . Anemia   . Anxiety   . Arthritis   . B12  deficiency   . Cancer ([place])    skin ca-melanoma  . Chronic idiopathic urticaria   . Complication of anesthesia    nausea, slow to wake up  . Depression   . Edema   . Family history of adverse reaction to anesthesia    Father - PONV  . Gastric ulcer   . GERD (gastroesophageal reflux disease)   . Gout   . Gout   . Hashimoto's thyroiditis   . Heart murmur   . History of eating disorder   . History of kidney stones    h/o years ago  . Hives   . Hyperlipidemia   . Hypertension   . Hypothyroidism   . Lower extremity edema   . Melanoma in situ ([place])    left shoulder  . Migraine    approx 4x/yr-migraines  . Mitral valve disorder   . Motion sickness    all moving vehicles  . Neuropathy    fingers and feet  . PCOS (polycystic ovarian syndrome)   . PONV (postoperative nausea and vomiting)   . Psoriatic arthritis ([place])   . Thyroid disease   . Ulcer   . Vertigo    last episode over 1 yr ago    Past Surgical History:  Procedure Laterality Date  . BONE EXCISION Left 09/03/2018   Procedure: PART EXCISION BONE-PHALANX 2,3,4 LEFT;  Surgeon: Albertine Patricia, DPM;  Location: [place];  Service: Podiatry;  Laterality: Left;  . BREAST BIOPSY Right 2007   neg- bx/clip  . CHOLECYSTECTOMY  2003  . COLONOSCOPY  WITH PROPOFOL N/A 05/11/2018   Procedure: COLONOSCOPY WITH PROPOFOL;  Surgeon: [name], MD;  Location: [facility] ENDOSCOPY;  Service: Endoscopy;  Laterality: N/A;  . DILITATION & CURRETTAGE/HYSTROSCOPY WITH NOVASURE ABLATION N/A 08/19/2016   Procedure: DILATATION & CURETTAGE/HYSTEROSCOPY WITH NOVASURE ABLATION;  Surgeon: [name], MD;  Location: [facility] OR[facility];  Service: Gynecology;  Laterality: N/A;  . ESOPHAGOGASTRODUODENOSCOPY (EGD) WITH PROPOFOL N/A 12/05/2015   Procedure: ESOPHAGOGASTRODUODENOSCOPY (EGD) WITH PROPOFOL;  Surgeon: Lollie Sails, MD;  Location: [facility] ENDOSCOPY;  Service: Endoscopy;  Laterality: N/A;  . ESOPHAGOGASTRODUODENOSCOPY (EGD) WITH  PROPOFOL N/A 02/06/2016   Procedure: ESOPHAGOGASTRODUODENOSCOPY (EGD) WITH PROPOFOL;  Surgeon: Lollie Sails, MD;  Location: [facility] ENDOSCOPY;  Service: Endoscopy;  Laterality: N/A;  . ESOPHAGOGASTRODUODENOSCOPY (EGD) WITH PROPOFOL N/A 05/11/2018   Procedure: ESOPHAGOGASTRODUODENOSCOPY (EGD) WITH PROPOFOL;  Surgeon: [name], MD;  Location: [facility] ENDOSCOPY;  Service: Endoscopy;  Laterality: N/A;  . ESOPHAGOGASTRODUODENOSCOPY (EGD) WITH PROPOFOL N/A 09/08/2018   Procedure: ESOPHAGOGASTRODUODENOSCOPY (EGD) WITH BIOPSY;  Surgeon: [name], MD;  Location: [place];  Service: Endoscopy;  Laterality: N/A;  . FOOT ARTHRODESIS Left 09/03/2018   Procedure: ARTHRODESIS; HALLUX/IP JOINT LEFT;  Surgeon: Albertine Patricia, DPM;  Location: [place];  Service: Podiatry;  Laterality: Left;  LMA LOCAL  . FOOT SURGERY  1994  . GASTRIC BYPASS  2011  . JOINT REPLACEMENT Left 09/27/2014   HIP  . JOINT REPLACEMENT Left 12/01/2014   KNEE  . KNEE ARTHROSCOPY Right   . KNEE ARTHROSCOPY Left 03/23/2015   Procedure: ARTHROSCOPY KNEE, partial synovectomy;  Surgeon: Hessie Knows, MD;  Location: [facility] OR[facility];  Service: Orthopedics;  Laterality: Left;  . LAPAROSCOPIC GASTRIC BANDING  2010  . POLYPECTOMY N/A 09/08/2018   Procedure: POLYPECTOMY;  Surgeon: [name], MD;  Location: [place];  Service: Endoscopy;  Laterality: N/A;  Stomach  . ROTATOR CUFF REPAIR Bilateral    right shoulder 07-26-2016  . SHOULDER ARTHROSCOPY Left 06/20/2015   Procedure: ARTHROSCOPY SHOULDER, REPAIR OF MASSIVE ROTATOR CUFF TEAR, TENODESIS, DECOMPRESSION DEBRIDEMENT;  Surgeon: Corky Mull, MD;  Location: [facility];  Service: Orthopedics;  Laterality: Left;  . SHOULDER ARTHROSCOPY WITH OPEN ROTATOR CUFF REPAIR Right 12/22/2018   Procedure: SHOULDER ARTHROSCOPY WITH OPEN ROTATOR CUFF REPAIR;  Surgeon: Corky Mull, MD;  Location: [facility];  Service: Orthopedics;  Laterality: Right;  . TOTAL  HIP ARTHROPLASTY Left 09/27/2014   Procedure: TOTAL HIP ARTHROPLASTY ANTERIOR APPROACH;  Surgeon: Hessie Knows, MD;  Location: [facility];  Service: Orthopedics;  Laterality: Left;  . TOTAL KNEE ARTHROPLASTY Left 12/01/2014   Procedure: TOTAL KNEE ARTHROPLASTY;  Surgeon: Hessie Knows, MD;  Location: [facility];  Service: Orthopedics;  Laterality: Left;    Family History  Problem Relation Age of Onset  . Arthritis Mother   . Hyperlipidemia Mother   . Hypertension Mother   . Diabetes Mother   . Sleep apnea Mother   . Obesity Mother   . Arthritis Father   . Hyperlipidemia Father   . Hypertension Father   . Mental illness Father   . Diabetes Father   . Obesity Father   . Sleep apnea Father   . Bipolar disorder Father   . Anxiety disorder Father   . Depression Father   . Cancer Father   . Kidney disease Father   . Arthritis Maternal Grandmother   . Cancer Maternal Grandmother        breast cancer  . Hyperlipidemia Maternal Grandmother   . Hypertension Maternal Grandmother   .  Breast cancer Maternal Grandmother   . Arthritis Maternal Grandfather   . Hyperlipidemia Maternal Grandfather   . Hypertension Maternal Grandfather   . Heart disease Maternal Grandfather        heart attack  . Breast cancer Paternal Grandmother     Social History:  reports that she has never smoked. She has never used smokeless tobacco. She reports current alcohol use. She reports that she does not use drugs. The patient lives in [place]. 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 The patient is alone today.  Allergies:  Allergies  Allergen Reactions  . Contrast Media [Iodinated Diagnostic Agents] Anaphylaxis    MRI dye  . Etodolac Anaphylaxis  . Alpha-Gal     Flu- like symptoms  . Influenza Vaccines Hives  . Lactose Nausea And Vomiting    Can have yogurt Can not have milk and ice cream   . Lactose Intolerance (Gi) Nausea And Vomiting    Can have yogurt Can not have milk and ice cream     . Bacitracin-Neomycin-Polymyxin Rash  . Neomycin-Bacitracin Zn-Polymyx Rash  . Neosporin + Pain Relief Max St [Neomy-Bacit-Polymyx-Pramoxine] Rash    Current Medications: Current Outpatient Medications  Medication Sig Dispense Refill  . clobetasol ointment (TEMOVATE) 0.25 % Apply 1 application topically at bedtime as needed (inflammation).     . COSENTYX SENSOREADY, 300 MG, 150 MG/ML SOAJ     . furosemide (LASIX) 40 MG tablet Take 1 tablet (40 mg total) by mouth daily. 90 tablet 1  . gabapentin (NEURONTIN) 300 MG capsule 300 mg 2 (two) times daily.     . Guselkumab (TREMFYA) 100 MG/ML SOPN Inject into the skin.    [name] levothyroxine (SYNTHROID) 137 MCG tablet SMARTSIG:1 Tablet(s) By Mouth Daily    . metFORMIN (GLUCOPHAGE) 500 MG tablet Take 1 tablet (500 mg total) by mouth daily with breakfast. 90 tablet 0  . montelukast (SINGULAIR) 10 MG tablet Take 10 mg by mouth at bedtime.    . pantoprazole (PROTONIX) 20 MG tablet Take 1 tablet (20 mg total) by mouth daily. 90 tablet 0  . potassium chloride SA (KLOR-CON) 20 MEQ tablet Take 1 tablet (20 mEq total) by mouth 2 (two) times daily. 90 tablet 1  . sertraline (ZOLOFT) 50 MG tablet Take 1 tablet (50 mg total) by mouth daily. 30 tablet 2  . triamterene-hydrochlorothiazide (MAXZIDE-25) 37.5-25 MG tablet Take 1 tablet by mouth daily. 90 tablet 1  . EPINEPHrine 0.3 mg/0.3 mL IJ SOAJ injection Inject 0.3 mLs (0.3 mg total) into the muscle as needed for anaphylaxis. (Patient not taking: Reported on 09/23/2019) 1 each 0   No current facility-administered medications for this visit.   Facility-Administered Medications Ordered in Other Visits  Medication Dose Route Frequency Provider Last Rate Last Admin  . 0.9 %  sodium chloride infusion   Intravenous Continuous Lequita Asal, MD 0 mL/hr at 05/08/17 1437 New Bag at 05/11/18 1437  . cyanocobalamin ((VITAMIN B-12)) injection 1,000 mcg  1,000 mcg Intramuscular Once Lequita Asal, MD         Review of Systems  Constitutional: Negative for chills, diaphoresis, fever, malaise/fatigue and weight loss (up 11 lbs).       Feels "ok".  HENT: Negative.  Negative for congestion, ear discharge, ear pain, hearing loss, nosebleeds, sinus pain, sore throat and tinnitus.   Eyes: Negative.  Negative for blurred vision, double vision and photophobia.  Respiratory: Negative for cough, sputum production, shortness of breath and wheezing.  Cardiovascular: Negative.  Negative for chest pain, palpitations and leg swelling.  Gastrointestinal: Negative.  Negative for abdominal pain, blood in stool, constipation, diarrhea, heartburn, melena, nausea and vomiting.  Genitourinary: Negative.  Negative for dysuria, frequency, hematuria and urgency.  Musculoskeletal: Positive for joint pain (right shoulder, s/p shoulder replacement). Negative for back pain and myalgias.  Skin: Negative.  Negative for rash.  Neurological: Positive for sensory change (restless legs). Negative for dizziness, tingling, speech change, focal weakness, weakness and headaches.  Endo/Heme/Allergies: Does not bruise/bleed easily.       Thyroid disease on Synthroid.   Psychiatric/Behavioral: Negative.  Negative for depression and substance abuse. The patient is not nervous/anxious and does not have insomnia.   All other systems reviewed and are negative.  Performance status (ECOG): 0-1  Vitals Blood pressure (!) 151/80, pulse 87, temperature (!) 97 F (36.1 C), temperature source Tympanic, weight 249 lb 10.7 oz (113.3 kg), SpO2 99 %.   Physical Exam Vitals and nursing note reviewed.  Constitutional:      General: She is not in acute distress.    Appearance: She is well-developed. She is not diaphoretic.     Interventions: Face mask in place.  HENT:     Head: Normocephalic and atraumatic.     Comments: Short brown hair.  Mask.    Mouth/Throat:     Mouth: Mucous membranes are moist.     Pharynx: Oropharynx is clear.   Eyes:     General: No scleral icterus.    Extraocular Movements: Extraocular movements intact.     Conjunctiva/sclera: Conjunctivae normal.     Pupils: Pupils are equal, round, and reactive to light.     Comments: Blue eyes.  Neck:     Vascular: No JVD.  Cardiovascular:     Rate and Rhythm: Normal rate and regular rhythm.     Pulses: Normal pulses.     Heart sounds: Normal heart sounds. No murmur heard.  No friction rub. No gallop.   Pulmonary:     Effort: Pulmonary effort is normal. No respiratory distress.     Breath sounds: Normal breath sounds. No wheezing or rales.  Chest:     Chest wall: No tenderness.  Abdominal:     General: Bowel sounds are normal. There is no distension.     Palpations: Abdomen is soft. There is no mass.     Tenderness: There is no abdominal tenderness. There is no guarding or rebound.  Musculoskeletal:        General: Normal range of motion.     Right shoulder: Tenderness present.     Cervical back: Normal range of motion and neck supple.     Comments: Walks with a limp. S/p right shoulder replacement  Lymphadenopathy:     Head:     Right side of head: No preauricular, posterior auricular or occipital adenopathy.     Left side of head: No preauricular, posterior auricular or occipital adenopathy.     Cervical: No cervical adenopathy.     Upper Body:     Right upper body: No supraclavicular adenopathy.     Left upper body: No supraclavicular adenopathy.     Lower Body: No right inguinal adenopathy. No left inguinal adenopathy.  Skin:    General: Skin is warm and dry.     Coloration: Skin is not pale.     Findings: No erythema.  Neurological:     Mental Status: She is alert and oriented to person, place, and time. Mental status  is at baseline.  Psychiatric:        Mood and Affect: Mood normal.        Behavior: Behavior normal.        Thought Content: Thought content normal.        Judgment: Judgment normal.    Appointment on 09/22/2019   Component Date Value Ref Range Status  . Iron 09/22/2019 56  28 - 170 ug/dL Final  . TIBC 09/22/2019 339  250 - 450 ug/dL Final  . Saturation Ratios 09/22/2019 17  10.4 - 31.8 % Final  . UIBC 09/22/2019 283  ug/dL Final   Performed at [facility], [address] [place], [place] [address]  . Ferritin 09/22/2019 20  11 - 307 ng/mL Final   Performed at [facility], [address] [place], [place] [address]  . WBC 09/22/2019 6.0  4.0 - 10.5 K/uL Final  . RBC 09/22/2019 4.41  3.87 - 5.11 MIL/uL Final  . Hemoglobin 09/22/2019 12.6  12.0 - 15.0 g/dL Final  . HCT 09/22/2019 39.0  36 - 46 % Final  . MCV 09/22/2019 88.4  80.0 - 100.0 fL Final  . MCH 09/22/2019 28.6  26.0 - 34.0 pg Final  . MCHC 09/22/2019 32.3  30.0 - 36.0 g/dL Final  . RDW 09/22/2019 12.4  11.5 - 15.5 % Final  . Platelets 09/22/2019 317  150 - 400 K/uL Final  . nRBC 09/22/2019 0.0  0.0 - 0.2 % Final  . Neutrophils Relative % 09/22/2019 60  % Final  . Neutro Abs 09/22/2019 3.5  1.7 - 7.7 K/uL Final  . Lymphocytes Relative 09/22/2019 29  % Final  . Lymphs Abs 09/22/2019 1.7  0.7 - 4.0 K/uL Final  . Monocytes Relative 09/22/2019 8  % Final  . Monocytes Absolute 09/22/2019 0.5  0 - 1 K/uL Final  . Eosinophils Relative 09/22/2019 2  % Final  . Eosinophils Absolute 09/22/2019 0.1  0 - 0 K/uL Final  . Basophils Relative 09/22/2019 1  % Final  . Basophils Absolute 09/22/2019 0.1  0 - 0 K/uL Final  . Immature Granulocytes 09/22/2019 0  % Final  . Abs Immature Granulocytes 09/22/2019 0.01  0.00 - 0.07 K/uL Final   Performed at [facility], [address] [place], [place] [address]    Assessment:  Lori Le is a 54 y.o. female s/p gastric bypass surgery(2011) with iron deficiency anemia. She has a history of bleeding ulcerin 10/2015. She hadheavy menses. She underwent uterine ablationon 08/19/2016. She bruises easily.  EGDon 12/05/2015 revealed edema and erythema of the gastrojejunal  anastomosis. She was treated with Carafate and Protonix. Repeat EGDon 02/06/2016 revealed minimal erythema on the tips of some gastric folds.  EGDon 05/11/2018 revealed salmon-colored mucosa suspicious for short-segment Barrett's esophagus.Pathology revealed squamocolumnar mucosa with features of mild reflux gastroesophagitis. There was no dysplasia or malignancy. Gastric bypasswith gastrojejunal anastomosiswascharacterized by ulceration.Pathology revealed oxyntic mucosa with mild oxyntic gland hyperplasia. There was no malignancy, dysplasia, or H pylori. Examined jejunumwas normal.  EGD on 09/08/2018 revealed salmon-colored mucosa suspicious for short-segment Barrett's esophagus. There were a few gastric polyps. Roux-en-Y gastrojejunostomy with gastrojejunal anastomosis characterized by healthy appearing mucosa and an intact appearance was noted. Normal examined jejunum.  Pathology revealed gastric xanthoma, fundic gland polyp, and oxyntic gland hyperplasia, consistent with proton pump inhibitor effect and negative for H pylori, intestinal metaplasia, dysplasia, and malignancy. There was squamocolumnar mucosa with mild chronic inflammation.   Colonoscopyon 05/11/2018 revealed one 10 mm  polyp in the cecum(tubular adenoma),one 3 mm polyp in the transverse colon,one 7 mm polyp in the transverse colon,and one 6 mm polyp in the transverse colon(all fragments of tubular adenomas). Repeat colonoscopy in 3 years planned.  Dietis good. She denies any ice pica. She is intolerant of oral ironsecondary to severe constipation. She has B12 deficiency. B12 was 168 on 12/29/2012, 367 on 11/29/2015, and 252 on 10/24/2016. She restarted B12 (last in clinic 10/16/2017). She receives B12 injections monthly at home.  Folate was 22 on 07/21/2019.  Workup on 12/08/2017revealed a hematocrit of 30.1, hemoglobin 9.1, MCV 70.7, platelets 331,000, WBC 8200 with an ANC of 5800. Ferritinwas 5. PT  and PTT were normal. Platelet function assay was normal.  She received Venofer200 mg IV weekly x 4 (03/15/2016 - 04/05/2016), x 2 (07/23/2016 and 08/05/2016), x 3 (01/29/2017 - 02/12/2017), x 2 (05/01/2017 - 05/08/2017), x 2 (08/04/2017 - 08/13/2017), x2 (05/25/2018 - 06/02/2018), and x3 (11/25/2018 - 12/16/2018). She receives Venofer if her ferritin is < 30.  Ferritinhas been followed: 8 on 01/24/2017, 20 on 04/28/2017, 18 on 07/28/2017, 135 on 09/12/2017, 48 on 10/27/2017, 53 on 01/27/2018, 33 on 05/22/2018, 38 on 08/25/2018, 23 on 11/18/2018, 86 on 02/17/2019, 32 on 05/24/2019, 47 on 07/21/2019, 52.5 on 08/05/2019, and 20 on 09/22/2019.   She has a family history of breast cancer. Screening bilateral mammogramon 03/04/2020revealed no evidence of malignancy.  She has not received her COVID-19 vaccines.  Symptomatically, she feels "ok".  She denies any bleeding.  She ran out of B12 injections for home use.  Exam is stable.  Plan: 1.   Review labs from 09/22/2019. 2. Iron deficiency anemia Hematocrit 39.0. Hemoglobin 12.6. MCV 88.4. Ferritin 20 with an iron saturation of 17%. She denies any melena, hematochezia or hematuria.   Discuss Venofer x 1. 3. B12 deficiency B12 injections are performed monthly at home (none in past month).  Refill home B12 injections.  Folate was 22 on 07/21/2019.  Check folate annually. 4.   Free light chains  Discussed patient's concerns about free light chains on recent [facility] labs.  Kappa free light chains were minimally elevated 3.05 (0.33-1.94) but with normal ratio.  M-spike was 0.  Discuss no concern.   Recheck in 6 months to alleviate concerns. 5.   Venofer x 1 next week. 6.   RTC in 3 months for labs (CBC with diff, ferritin, folate).   7.   RTC in 6 months for MD assessment, labs (CBC with diff, FLCA, ferritin, iron studies- day before) and +/- Venofer.  I discussed the assessment  and treatment plan with the patient.  The patient was provided an opportunity to ask questions and all were answered.  The patient agreed with the plan and demonstrated an understanding of the instructions.  The patient was advised to call back if the symptoms worsen or if the condition fails to improve as anticipated.   Lequita Asal, MD, PhD    09/23/2019, 11:10 AM  I, Mirian Mo Tufford, am acting as Education administrator for [employer]. Mike Gip, MD, PhD.  I, Melissa C. Mike Gip, MD, have reviewed the above documentation for accuracy and completeness, and I agree with the above.

## 2019-09-23 ENCOUNTER — Encounter: Payer: Self-pay | Admitting: Hematology and Oncology

## 2019-09-23 ENCOUNTER — Inpatient Hospital Stay: Payer: Managed Care, Other (non HMO)

## 2019-09-23 ENCOUNTER — Other Ambulatory Visit: Payer: Self-pay

## 2019-09-23 ENCOUNTER — Inpatient Hospital Stay (HOSPITAL_BASED_OUTPATIENT_CLINIC_OR_DEPARTMENT_OTHER): Payer: Managed Care, Other (non HMO) | Admitting: Hematology and Oncology

## 2019-09-23 VITALS — BP 151/80 | HR 87 | Temp 97.0°F | Wt 249.7 lb

## 2019-09-23 DIAGNOSIS — K9589 Other complications of other bariatric procedure: Secondary | ICD-10-CM

## 2019-09-23 DIAGNOSIS — E538 Deficiency of other specified B group vitamins: Secondary | ICD-10-CM

## 2019-09-23 DIAGNOSIS — R768 Other specified abnormal immunological findings in serum: Secondary | ICD-10-CM | POA: Diagnosis not present

## 2019-09-23 DIAGNOSIS — D509 Iron deficiency anemia, unspecified: Secondary | ICD-10-CM | POA: Diagnosis not present

## 2019-09-23 DIAGNOSIS — D508 Other iron deficiency anemias: Secondary | ICD-10-CM

## 2019-09-23 MED ORDER — B-12 COMPLIANCE INJECTION 1000 MCG/ML IJ KIT: 1000.0000 ug | PACK | INTRAMUSCULAR | 3 refills | Status: DC

## 2019-09-23 NOTE — Progress Notes (Signed)
Patient would like to request information on the Free light chain results 3.05 which she had done at [facility] which is in care everywhere.

## 2019-09-27 ENCOUNTER — Other Ambulatory Visit: Payer: Self-pay

## 2019-09-27 ENCOUNTER — Inpatient Hospital Stay: Payer: Managed Care, Other (non HMO)

## 2019-09-27 VITALS — BP 127/84 | HR 64 | Temp 97.8°F | Resp 18

## 2019-09-27 DIAGNOSIS — D509 Iron deficiency anemia, unspecified: Secondary | ICD-10-CM | POA: Diagnosis not present

## 2019-09-27 DIAGNOSIS — D500 Iron deficiency anemia secondary to blood loss (chronic): Secondary | ICD-10-CM

## 2019-09-27 MED ORDER — SODIUM CHLORIDE 0.9 % IV SOLN: 200.0000 mg | Freq: Once | INTRAVENOUS | Status: DC

## 2019-09-27 MED ORDER — SODIUM CHLORIDE 0.9 % IV SOLN
INTRAVENOUS | Status: DC
  Filled 2019-09-27: qty 250

## 2019-09-27 MED ORDER — IRON SUCROSE 20 MG/ML IV SOLN
200.0000 mg | Freq: Once | INTRAVENOUS | Status: AC
  Administered 2019-09-27: 200 mg via INTRAVENOUS
  Filled 2019-09-27: qty 10

## 2019-10-05 ENCOUNTER — Other Ambulatory Visit: Payer: Self-pay

## 2019-10-05 ENCOUNTER — Ambulatory Visit
Admission: RE | Admit: 2019-10-05 | Discharge: 2019-10-05 | Disposition: A | Discharge status: Home / Self Care | Payer: Managed Care, Other (non HMO) | Source: Ambulatory Visit | Attending: Internal Medicine | Admitting: Internal Medicine

## 2019-10-05 DIAGNOSIS — Z1231 Encounter for screening mammogram for malignant neoplasm of breast: Secondary | ICD-10-CM | POA: Insufficient documentation

## 2019-10-06 ENCOUNTER — Other Ambulatory Visit: Payer: Self-pay

## 2019-10-06 ENCOUNTER — Encounter: Payer: Self-pay | Admitting: Internal Medicine

## 2019-10-06 MED ORDER — POTASSIUM CHLORIDE CRYS ER 20 MEQ PO TBCR: 20.0000 meq | EXTENDED_RELEASE_TABLET | Freq: Two times a day (BID) | ORAL | 1 refills | Status: DC

## 2019-10-06 MED ORDER — SERTRALINE HCL 50 MG PO TABS: 50.0000 mg | ORAL_TABLET | Freq: Every day | ORAL | 1 refills | Status: DC

## 2019-11-09 ENCOUNTER — Other Ambulatory Visit: Payer: Self-pay | Admitting: Internal Medicine

## 2019-12-23 ENCOUNTER — Inpatient Hospital Stay: Payer: Managed Care, Other (non HMO) | Attending: Hematology and Oncology

## 2019-12-23 ENCOUNTER — Other Ambulatory Visit: Payer: Self-pay

## 2019-12-23 DIAGNOSIS — D509 Iron deficiency anemia, unspecified: Secondary | ICD-10-CM | POA: Insufficient documentation

## 2019-12-23 DIAGNOSIS — D508 Other iron deficiency anemias: Secondary | ICD-10-CM

## 2019-12-23 LAB — CBC WITH DIFFERENTIAL/PLATELET
Abs Immature Granulocytes: 0.04 10*3/uL (ref 0.00–0.07)
Basophils Absolute: 0 10*3/uL (ref 0.0–0.1)
Basophils Relative: 0 %
Eosinophils Absolute: 0 10*3/uL (ref 0.0–0.5)
Eosinophils Relative: 0 %
HCT: 43.2 % (ref 36.0–46.0)
Hemoglobin: 13.7 g/dL (ref 12.0–15.0)
Immature Granulocytes: 1 %
Lymphocytes Relative: 18 %
Lymphs Abs: 1.1 10*3/uL (ref 0.7–4.0)
MCH: 27.7 pg (ref 26.0–34.0)
MCHC: 31.7 g/dL (ref 30.0–36.0)
MCV: 87.4 fL (ref 80.0–100.0)
Monocytes Absolute: 0.1 10*3/uL (ref 0.1–1.0)
Monocytes Relative: 2 %
Neutro Abs: 4.8 10*3/uL (ref 1.7–7.7)
Neutrophils Relative %: 79 %
Platelets: 274 10*3/uL (ref 150–400)
RBC: 4.94 MIL/uL (ref 3.87–5.11)
RDW: 14.3 % (ref 11.5–15.5)
WBC: 6.2 10*3/uL (ref 4.0–10.5)
nRBC: 0 % (ref 0.0–0.2)

## 2019-12-23 LAB — FERRITIN: Ferritin: 26 ng/mL (ref 11–307)

## 2019-12-23 LAB — FOLATE: Folate: 20 ng/mL (ref >5.9)

## 2019-12-27 ENCOUNTER — Other Ambulatory Visit: Payer: Managed Care, Other (non HMO)

## 2019-12-28 ENCOUNTER — Telehealth: Payer: Self-pay

## 2019-12-28 NOTE — Telephone Encounter (Signed)
-----   Message from Lequita Asal, MD sent at 12/24/2019  1:32 AM EDT ----- Regarding: Please call patient  Ferritin < 30.  Hemoglobin normal.  If symptomatic, consider Venofer x 1.  M ----- Message ----- From: Buel Ream, Lab In [place] Sent: 12/23/2019   1:52 PM EDT To: Lequita Asal, MD

## 2019-12-28 NOTE — Telephone Encounter (Signed)
Spoke with the patient to inform her, Per Dr Mike Gip her ferritin was low < 30 hbg was normal. The patient reports she is fine and dont feel like she need a IV Iron infusion. The patient was understanding and agreeable.

## 2019-12-30 ENCOUNTER — Other Ambulatory Visit: Payer: Self-pay | Admitting: Internal Medicine

## 2020-01-03 ENCOUNTER — Other Ambulatory Visit: Payer: Self-pay | Admitting: Internal Medicine

## 2020-02-18 ENCOUNTER — Other Ambulatory Visit: Payer: Self-pay

## 2020-02-18 MED ORDER — PANTOPRAZOLE SODIUM 20 MG PO TBEC: 20.0000 mg | DELAYED_RELEASE_TABLET | Freq: Every day | ORAL | 0 refills | Status: DC

## 2020-02-21 IMAGING — MR MR FOOT*R* WO/W CM
9 series · 40 of 40 positions shown · IV contrast (multihance)
Comparison: Right foot x-rays dated February 24, 2017.

CLINICAL DATA: History of stepping on glass in January 2017
complicated by infection, status post incision and drainage last
week.

EXAM:
MRI OF THE RIGHT FOREFOOT WITHOUT AND WITH CONTRAST
TECHNIQUE: Multiplanar, multisequence MR imaging of the right forefoot was
performed before and after the administration of intravenous
contrast.
CONTRAST:  20mL MULTIHANCE GADOBENATE DIMEGLUMINE 529 MG/ML IV SOLN

[Series 5: T1 · coronal · 3.0mm · 0.75mm/px · 5 of 44 slices shown (1 of 2)]
[im 1/44]
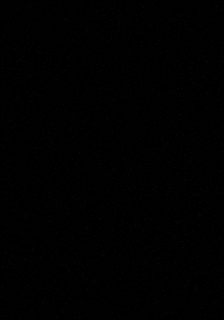
[im 11/44]
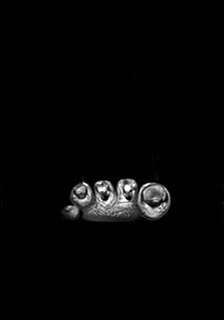
[im 22/44]
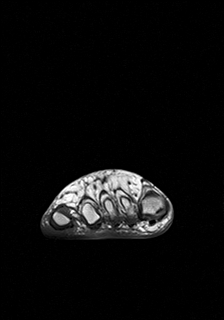
[im 33/44]
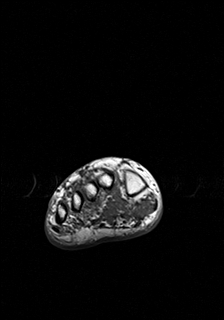
[im 44/44]
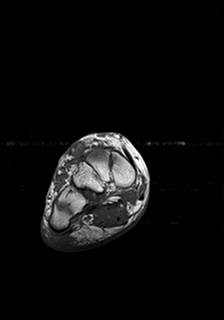

[Series 6: axial t1fs (only · coronal · 3.0mm · 0.94mm/px · 5 of 44 slices shown]
[im 1/44]
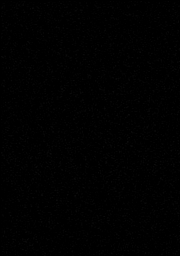
[im 11/44]
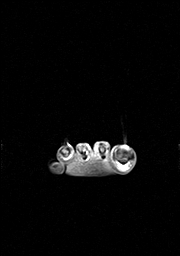
[im 22/44]
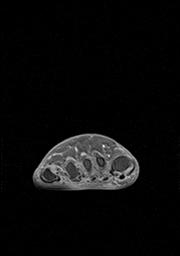
[im 33/44]
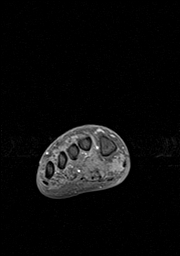
[im 44/44]
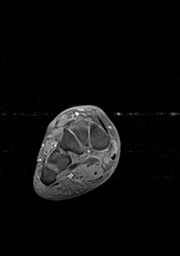

[Series 7: T2 fat-sat · coronal · 3.0mm · 0.43mm/px · 5 of 44 slices shown]
[im 1/44]
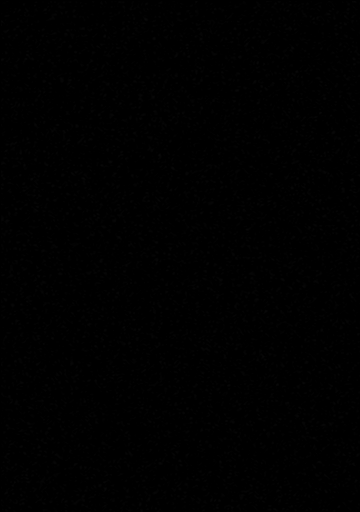
[im 11/44]
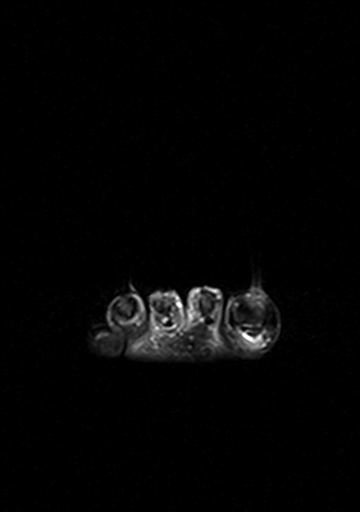
[im 22/44]
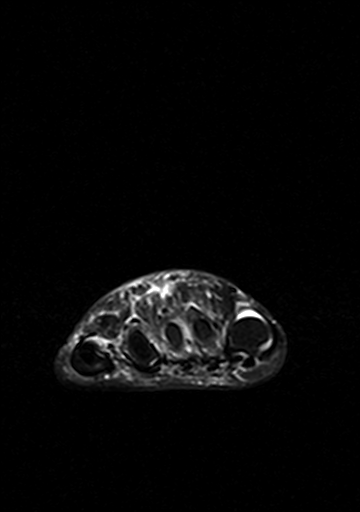
[im 33/44]
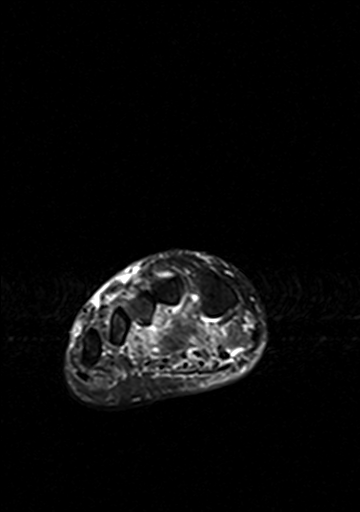
[im 44/44]
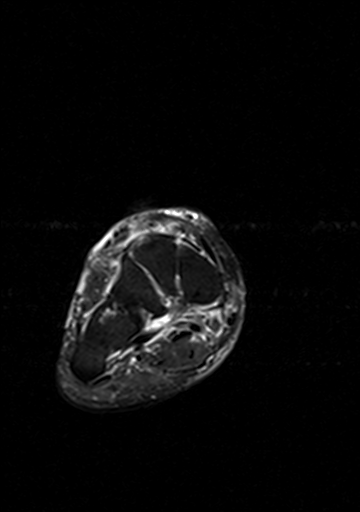

[Series 12: T1 · axial · 3.0mm · 0.62mm/px · z∈[-154,-64]mm · 4 of 29 slices shown (2 of 2)]
[im 1/29]
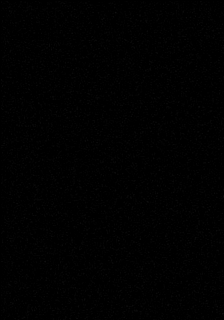
[im 10/29]
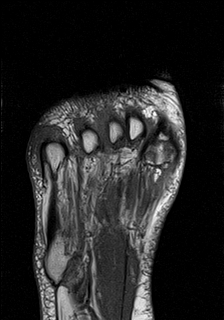
[im 19/29]
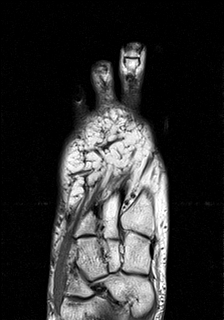
[im 29/29]
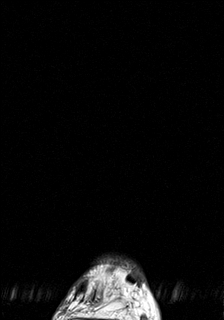

[Series 13: STIR · axial · 3.0mm · 0.39mm/px · z∈[-154,-64]mm · 4 of 29 slices shown (1 of 2)]
[im 1/29]
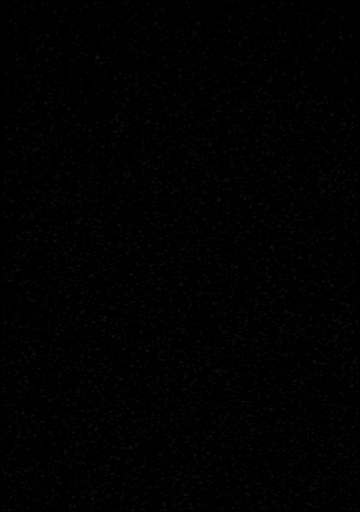
[im 10/29]
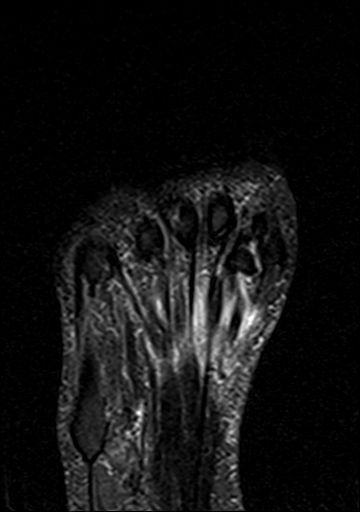
[im 19/29]
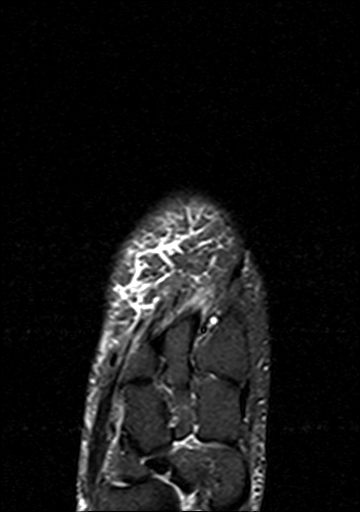
[im 29/29]
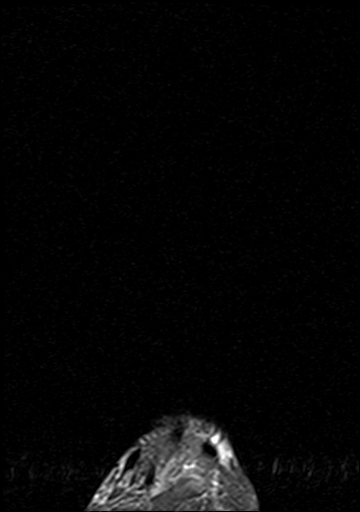

[Series 14: STIR · sagittal · 3.0mm · 0.39mm/px · 4 of 32 slices shown (2 of 2)]
[im 1/32]
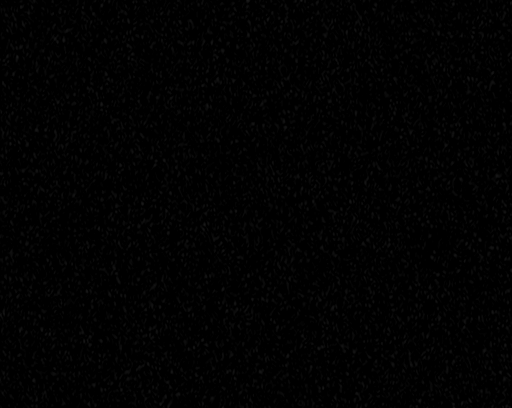
[im 11/32]
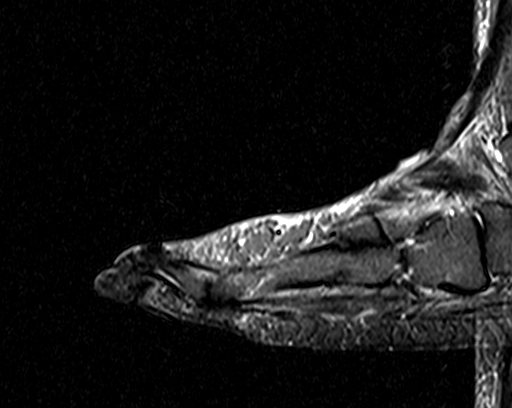
[im 21/32]
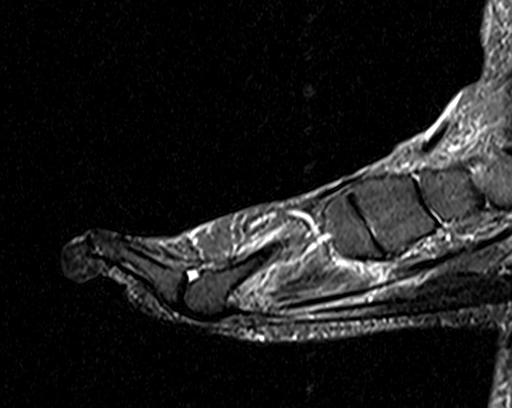
[im 32/32]
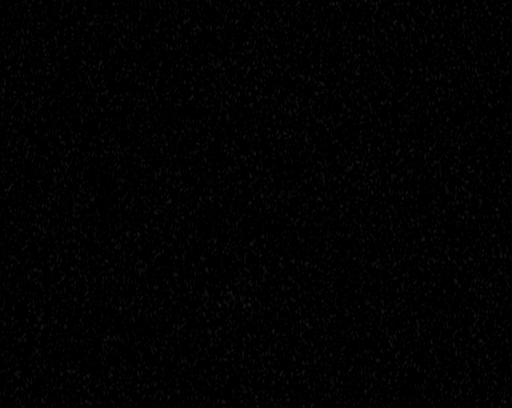

[Series 15: T1 fat-sat · coronal · 3.0mm · 0.94mm/px · 5 of 43 slices shown (1 of 3)]
[im 1/43]
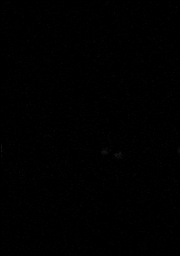
[im 11/43]
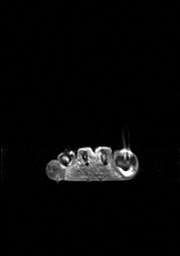
[im 22/43]
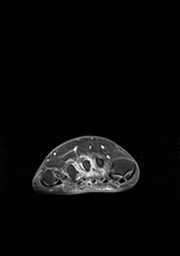
[im 32/43]
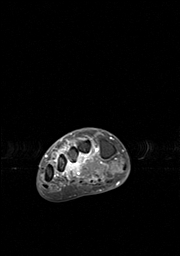
[im 43/43]
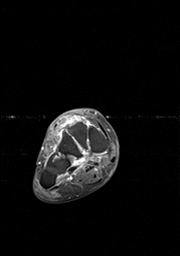

[Series 16: T1 fat-sat · axial · 3.0mm · 0.62mm/px · z∈[-154,-64]mm · 4 of 29 slices shown (2 of 3)]
[im 1/29]
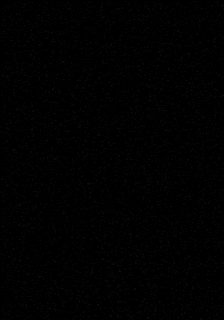
[im 10/29]
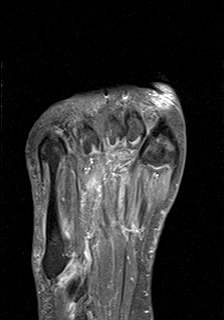
[im 19/29]
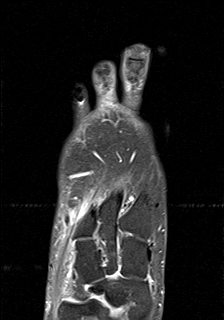
[im 29/29]
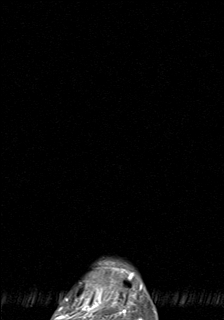

[Series 17: T1 fat-sat · sagittal · 3.0mm · 0.78mm/px · 4 of 32 slices shown (3 of 3)]
[im 1/32]
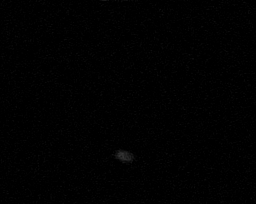
[im 11/32]
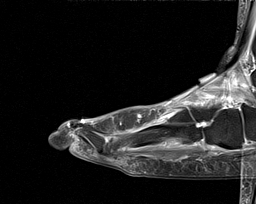
[im 21/32]
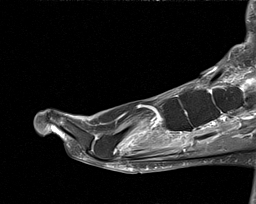
[im 32/32]
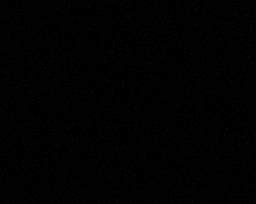

[40 of 40 positions shown; findings below may reference images not displayed]

FINDINGS: Bones/Joint/Cartilage

No suspicious marrow signal abnormality. Small focus of degenerative
marrow edema in the cuboid at the fourth tarsometatarsal joint. No
fracture or dislocation. Normal alignment. No joint effusion.
Susceptibility artifact related to cerclage wire in the first
proximal phalanx.

Ligaments

Collateral ligaments are intact.  Lisfranc ligament is intact.

Muscles and Tendons
Flexor, peroneal and extensor compartment tendons are intact.
Prominent increased T2 signal and enhancement of the intrinsic
forefoot muscles surrounding the mid to distal third metatarsal.

Soft tissue
Small area of ulceration at the plantar base of the third metatarsal
head. No fluid collection or hematoma. No soft tissue mass.
IMPRESSION: 1. Prominent edema and enhancement of the intrinsic forefoot muscles
surrounding the mid to distal third metatarsal, nonspecific, but
suspicious for myositis given clinical history. No drainable fluid
collection.
2. Small ulceration at the plantar base of the third metatarsal head
may represent the site of recent incision and drainage. Correlate
clinically.
3. No evidence of osteomyelitis.

## 2020-02-22 ENCOUNTER — Other Ambulatory Visit: Payer: Self-pay

## 2020-02-22 MED ORDER — PANTOPRAZOLE SODIUM 20 MG PO TBEC: 20.0000 mg | DELAYED_RELEASE_TABLET | Freq: Every day | ORAL | 0 refills | Status: DC

## 2020-03-06 ENCOUNTER — Other Ambulatory Visit: Payer: Self-pay

## 2020-03-07 ENCOUNTER — Ambulatory Visit (INDEPENDENT_AMBULATORY_CARE_PROVIDER_SITE_OTHER): Payer: Managed Care, Other (non HMO) | Admitting: Gastroenterology

## 2020-03-07 ENCOUNTER — Encounter: Payer: Self-pay | Admitting: Gastroenterology

## 2020-03-07 ENCOUNTER — Ambulatory Visit: Payer: Managed Care, Other (non HMO) | Admitting: Gastroenterology

## 2020-03-07 VITALS — BP 119/76 | HR 81 | Temp 98.2°F | Ht 68.0 in | Wt 244.0 lb

## 2020-03-07 DIAGNOSIS — K219 Gastro-esophageal reflux disease without esophagitis: Secondary | ICD-10-CM

## 2020-03-07 MED ORDER — FAMOTIDINE 20 MG PO TABS: 20.0000 mg | ORAL_TABLET | Freq: Every day | ORAL | 3 refills | Status: DC

## 2020-03-07 NOTE — Progress Notes (Signed)
Lori Antigua, MD [address]  [place]  [place], [place] [address]  Main: [phone]  Fax: [phone]   Primary Care Physician: Einar Pheasant, MD   Chief Complaint  Patient presents with  . IDA    HPI: Lori Le is a 54 y.o. female here for follow-up of iron deficiency anemia, gastric bypass, reflux.  Patient states as long as she is taking her Protonix 20 mg once a day she is asymptomatic.  Even if she misses 1 day, she has severe symptoms.  Otherwise reports good appetite. The patient denies abdominal or flank pain, anorexia, nausea or vomiting, dysphagia, change in bowel habits or black or bloody stools or weight loss.  Following with Dr. Mike Gip of hematology and responding well to iron replacement.   Current Outpatient Medications  Medication Sig Dispense Refill  . allopurinol (ZYLOPRIM) 300 MG tablet Take 300 mg by mouth daily.    . clobetasol ointment (TEMOVATE) 7.35 % Apply 1 application topically at bedtime as needed (inflammation).     . colchicine 0.6 MG tablet Take 0.6 mg by mouth daily.    . cyanocobalamin (,VITAMIN B-12,) 1000 MCG/ML injection Inject 1 mL into the muscle daily.    [name] EPINEPHrine 0.3 mg/0.3 mL IJ SOAJ injection Inject 0.3 mLs (0.3 mg total) into the muscle as needed for anaphylaxis. 1 each 0  . furosemide (LASIX) 40 MG tablet TAKE 1 TABLET(40 MG) BY MOUTH DAILY 90 tablet 1  . gabapentin (NEURONTIN) 300 MG capsule 300 mg 2 (two) times daily.     [name] levothyroxine (SYNTHROID) 137 MCG tablet SMARTSIG:1 Tablet(s) By Mouth Daily    . pantoprazole (PROTONIX) 20 MG tablet Take 1 tablet (20 mg total) by mouth daily. 90 tablet 0  . potassium chloride SA (KLOR-CON) 20 MEQ tablet TAKE 1 TABLET(20 MEQ) BY MOUTH TWICE DAILY 90 tablet 0  . sertraline (ZOLOFT) 50 MG tablet Take 1 tablet (50 mg total) by mouth daily. 90 tablet 1  . triamterene-hydrochlorothiazide (MAXZIDE-25) 37.5-25 MG tablet TAKE 1 TABLET BY MOUTH DAILY 90 tablet 1  .  famotidine (PEPCID) 20 MG tablet Take 1 tablet (20 mg total) by mouth daily. 30 tablet 3   No current facility-administered medications for this visit.   Facility-Administered Medications Ordered in Other Visits  Medication Dose Route Frequency Provider Last Rate Last Admin  . 0.9 %  sodium chloride infusion   Intravenous Continuous Lequita Asal, MD 0 mL/hr at 05/08/17 1437 New Bag at 05/11/18 1437  . cyanocobalamin ((VITAMIN B-12)) injection 1,000 mcg  1,000 mcg Intramuscular Once Lequita Asal, MD        Allergies as of 03/07/2020 - Review Complete 03/07/2020  Allergen Reaction Noted  . Contrast media [iodinated diagnostic agents] Anaphylaxis 05/24/2017  . Etodolac Anaphylaxis 02/05/2017  . Alpha-gal  08/27/2018  . Influenza vaccines Hives 12/09/2013  . Lactose Nausea And Vomiting 04/25/2015  . Lactose intolerance (gi) Nausea And Vomiting 09/27/2014  . Bacitracin-neomycin-polymyxin Rash 04/25/2015  . Neomycin-bacitracin zn-polymyx Rash   . Neosporin + pain relief max st [neomy-bacit-polymyx-pramoxine] Rash 07/31/2012    ROS:  General: Negative for anorexia, weight loss, fever, chills, fatigue, weakness. ENT: Negative for hoarseness, difficulty swallowing , nasal congestion. CV: Negative for chest pain, angina, palpitations, dyspnea on exertion, peripheral edema.  Respiratory: Negative for dyspnea at rest, dyspnea on exertion, cough, sputum, wheezing.  GI: See history of present illness. GU:  Negative for dysuria, hematuria, urinary incontinence, urinary frequency, nocturnal urination.  Endo: Negative for  unusual weight change.    Physical Examination:   BP 119/76   Pulse 81   Temp 98.2 F (36.8 C) (Oral)   Ht 5\' 8"  (1.727 m)   Wt 244 lb (110.7 kg)   BMI 37.10 kg/m   General: Well-nourished, well-developed in no acute distress.  Eyes: No icterus. Conjunctivae pink. Mouth: Oropharyngeal mucosa moist and pink , no lesions erythema or exudate. Neck: Supple,  Trachea midline Abdomen: Bowel sounds are normal, nontender, nondistended, no hepatosplenomegaly or masses, no abdominal bruits or hernia , no rebound or guarding.   Extremities: No lower extremity edema. No clubbing or deformities. Neuro: Alert and oriented x 3.  Grossly intact. Skin: Warm and dry, no jaundice.   Psych: Alert and cooperative, normal mood and affect.   Labs: CMP     Component Value Date/Time   NA 138 08/05/2019 1233   NA 141 05/10/2019 0944   K 3.7 08/05/2019 1233   K 4.0 09/27/2014 0000   CL 97 08/05/2019 1233   CO2 31 08/05/2019 1233   GLUCOSE 100 (H) 08/05/2019 1233   BUN 16 08/05/2019 1233   BUN 17 05/10/2019 0944   CREATININE 0.70 08/05/2019 1233   CREATININE 0.81 09/12/2017 1529   CALCIUM 9.2 08/05/2019 1233   PROT 6.5 05/10/2019 0944   ALBUMIN 4.1 05/10/2019 0944   AST 8 05/10/2019 0944   ALT 9 05/10/2019 0944   ALKPHOS 81 05/10/2019 0944   BILITOT 0.3 05/10/2019 0944   GFRNONAA >60 05/27/2019 1808   GFRAA >60 05/27/2019 1808   Lab Results  Component Value Date   WBC 6.2 12/23/2019   HGB 13.7 12/23/2019   HCT 43.2 12/23/2019   MCV 87.4 12/23/2019   PLT 274 12/23/2019    Imaging Studies: No results found.  Assessment and Plan:   Lori Le is a 54 y.o. y/o female here for follow-up of iron deficiency anemia, gastric bypass, reflux  No breakthrough symptoms on once daily Protonix Patient willing to try Pepcid instead of Protonix Will prescribe Pepcid and if patient has symptoms on this, we can resume Protonix on patient who is agreeable with this plan  (Risks of PPI use were discussed with patient including bone loss, C. Diff diarrhea, pneumonia, infections, CKD, electrolyte abnormalities.  Pt. Verbalizes understanding and chooses to continue the medication.)  Repeat EGD in 3 years from last 1 and repeat colonoscopy in 3 years from last 1 as previously planned, please see previous notes      Dr Lori Le

## 2020-03-26 ENCOUNTER — Other Ambulatory Visit: Payer: Self-pay | Admitting: Internal Medicine

## 2020-03-27 ENCOUNTER — Inpatient Hospital Stay: Payer: Managed Care, Other (non HMO) | Attending: Hematology and Oncology

## 2020-03-27 ENCOUNTER — Other Ambulatory Visit: Payer: Self-pay

## 2020-03-27 DIAGNOSIS — E538 Deficiency of other specified B group vitamins: Secondary | ICD-10-CM | POA: Diagnosis not present

## 2020-03-27 DIAGNOSIS — R768 Other specified abnormal immunological findings in serum: Secondary | ICD-10-CM

## 2020-03-27 DIAGNOSIS — K9589 Other complications of other bariatric procedure: Secondary | ICD-10-CM

## 2020-03-27 DIAGNOSIS — D509 Iron deficiency anemia, unspecified: Secondary | ICD-10-CM | POA: Diagnosis not present

## 2020-03-27 LAB — CBC WITH DIFFERENTIAL/PLATELET
Abs Immature Granulocytes: 0.01 10*3/uL (ref 0.00–0.07)
Basophils Absolute: 0.1 10*3/uL (ref 0.0–0.1)
Basophils Relative: 1 %
Eosinophils Absolute: 0.2 10*3/uL (ref 0.0–0.5)
Eosinophils Relative: 5 %
HCT: 39.8 % (ref 36.0–46.0)
Hemoglobin: 12.9 g/dL (ref 12.0–15.0)
Immature Granulocytes: 0 %
Lymphocytes Relative: 37 %
Lymphs Abs: 1.9 10*3/uL (ref 0.7–4.0)
MCH: 29.1 pg (ref 26.0–34.0)
MCHC: 32.4 g/dL (ref 30.0–36.0)
MCV: 89.6 fL (ref 80.0–100.0)
Monocytes Absolute: 0.4 10*3/uL (ref 0.1–1.0)
Monocytes Relative: 7 %
Neutro Abs: 2.6 10*3/uL (ref 1.7–7.7)
Neutrophils Relative %: 50 %
Platelets: 279 10*3/uL (ref 150–400)
RBC: 4.44 MIL/uL (ref 3.87–5.11)
RDW: 13 % (ref 11.5–15.5)
WBC: 5.2 10*3/uL (ref 4.0–10.5)
nRBC: 0 % (ref 0.0–0.2)

## 2020-03-27 LAB — IRON AND TIBC
Iron: 47 ug/dL (ref 28–170)
Saturation Ratios: 12 % (ref 10.4–31.8)
TIBC: 396 ug/dL (ref 250–450)
UIBC: 349 ug/dL

## 2020-03-27 LAB — FERRITIN: Ferritin: 15 ng/mL (ref 11–307)

## 2020-03-27 NOTE — Progress Notes (Signed)
[facility]  [address] [place], [place] [address] Phone: [phone]  Fax: [phone]   Clinic Day:  03/28/2020  Referring physician: [facility], MD  Chief Complaint: Lori Le is a 54 y.o. female with s/p gastric bypass surgery and subsequent iron deficiency and B12 deficiency who is seen for 6 month assessment.  HPI: The patient was last seen in the hematology clinic on 09/23/2019. At that time, she felt "ok".  She denied any bleeding.  She had run out of B12 injections for home use.  Exam was stable. Hematocrit was 39.0, hemoglobin 12.6, platelets 317,000, WBC 6,000. Ferritin was 20 with an iron saturation of 17% and a TIBC of 339.  She received Venofer on 09/27/2019.  Screening mammogram on 10/05/2019 revealed no evidence of malignancy.  The patient was seen in consultation by Dr. 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 Hematocrit was 41.6, hemoglobin 13.3, platelets 289,000, WBC 6,400.  Calcium was 8.7 and albumen 4.1. Protein was 7.1.  SPEP was normal.  Kappa free light chains were 2.63, lambda free light chains 1.85, and ratio 1.42 (normal). IgA was 384.  UPEP revealed no monoclonal protein.  BNP was 66 (normal).  There were no signs of clonality on SPEP and UPEP.  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  The patient saw Dr. Maximino Greenland on 03/07/2020. She had no breakthrough symptoms on daily Protonix. They discussed switching from Protonix to Pepcid. Follow up was planned for 1 year.  Labs on 12/23/2019 revealed a hematocrit of 43.2, hemoglobin 13.7, platelets 274,000, WBC 6,200. Ferritin was 26. Folate was 20.0.  During the interim, she has felt "good." She restarted her at home vitamin B12 injections. She does the injections in the middle of the month, around the 16th.  She switched from Protonix to Pepcid but it did not work.  After a week, she started having symptoms again so she switched back.  Her right shoulder is still bothering her a little bit s/p replacement. She still has restless legs. She denies ice pica.   Past Medical History:  Diagnosis Date  . Allergic rhinitis due to allergen   . Allergy   . Anemia   . Anxiety   . Arthritis   . B12 deficiency   . Cancer (HCC)    skin ca-melanoma  . Chronic idiopathic urticaria   . Complication of anesthesia    nausea, slow to wake up  . Depression   . Edema   . Family history of adverse reaction to anesthesia    Father - PONV  . Gastric ulcer   . GERD (gastroesophageal reflux disease)   . Gout   . Gout   . Hashimoto's thyroiditis   . Heart murmur   . History of eating disorder   . History of kidney stones    h/o years ago  . Hives   . Hyperlipidemia   . Hypertension   . Hypothyroidism   . Lower extremity edema   . Melanoma in situ (HCC)    left shoulder  . Migraine    approx 4x/yr-migraines  . Mitral valve disorder   . Motion sickness    all moving vehicles  . Neuropathy    fingers and feet  . PCOS (polycystic ovarian syndrome)   . PONV (postoperative nausea and vomiting)   . Psoriatic arthritis (HCC)   . Thyroid disease   .  Ulcer   . Vertigo    last episode over 1 yr ago    Past Surgical History:  Procedure Laterality Date  . BONE EXCISION Left 09/03/2018   Procedure: PART EXCISION BONE-PHALANX 2,3,4 LEFT;  Surgeon: Albertine Patricia, DPM;  Location: [facility];  Service: Podiatry;  Laterality: Left;  . BREAST BIOPSY Right 2007   neg- bx/clip  . CHOLECYSTECTOMY  2003  . COLONOSCOPY WITH PROPOFOL N/A 05/11/2018   Procedure: COLONOSCOPY WITH PROPOFOL;  Surgeon: [name], MD;  Location: [facility];  Service: Endoscopy;  Laterality: N/A;  . DILITATION & CURRETTAGE/HYSTROSCOPY WITH NOVASURE ABLATION N/A 08/19/2016   Procedure: DILATATION & CURETTAGE/HYSTEROSCOPY WITH NOVASURE ABLATION;  Surgeon: [name],  MD;  Location: [facility];  Service: Gynecology;  Laterality: N/A;  . ESOPHAGOGASTRODUODENOSCOPY (EGD) WITH PROPOFOL N/A 12/05/2015   Procedure: ESOPHAGOGASTRODUODENOSCOPY (EGD) WITH PROPOFOL;  Surgeon: Lollie Sails, MD;  Location: [facility];  Service: Endoscopy;  Laterality: N/A;  . ESOPHAGOGASTRODUODENOSCOPY (EGD) WITH PROPOFOL N/A 02/06/2016   Procedure: ESOPHAGOGASTRODUODENOSCOPY (EGD) WITH PROPOFOL;  Surgeon: Lollie Sails, MD;  Location: [facility];  Service: Endoscopy;  Laterality: N/A;  . ESOPHAGOGASTRODUODENOSCOPY (EGD) WITH PROPOFOL N/A 05/11/2018   Procedure: ESOPHAGOGASTRODUODENOSCOPY (EGD) WITH PROPOFOL;  Surgeon: [name], MD;  Location: [facility];  Service: Endoscopy;  Laterality: N/A;  . ESOPHAGOGASTRODUODENOSCOPY (EGD) WITH PROPOFOL N/A 09/08/2018   Procedure: ESOPHAGOGASTRODUODENOSCOPY (EGD) WITH BIOPSY;  Surgeon: [name], MD;  Location: [facility];  Service: Endoscopy;  Laterality: N/A;  . FOOT ARTHRODESIS Left 09/03/2018   Procedure: ARTHRODESIS; HALLUX/IP JOINT LEFT;  Surgeon: Albertine Patricia, DPM;  Location: [place] [facility];  Service: Podiatry;  Laterality: Left;  LMA LOCAL  . FOOT SURGERY  1994  . GASTRIC BYPASS  2011  . JOINT REPLACEMENT Left 09/27/2014   HIP  . JOINT REPLACEMENT Left 12/01/2014   KNEE  . KNEE ARTHROSCOPY Right   . KNEE ARTHROSCOPY Left 03/23/2015   Procedure: ARTHROSCOPY KNEE, partial synovectomy;  Surgeon: Hessie Knows, MD;  Location: [facility];  Service: Orthopedics;  Laterality: Left;  . LAPAROSCOPIC GASTRIC BANDING  2010  . POLYPECTOMY N/A 09/08/2018   Procedure: POLYPECTOMY;  Surgeon: [name], MD;  Location: [facility];  Service: Endoscopy;  Laterality: N/A;  Stomach  . ROTATOR CUFF REPAIR Bilateral    right shoulder 07-26-2016  . SHOULDER ARTHROSCOPY Left 06/20/2015   Procedure: ARTHROSCOPY SHOULDER, REPAIR OF MASSIVE ROTATOR CUFF TEAR, TENODESIS, DECOMPRESSION DEBRIDEMENT;   Surgeon: Corky Mull, MD;  Location: [facility];  Service: Orthopedics;  Laterality: Left;  . SHOULDER ARTHROSCOPY WITH OPEN ROTATOR CUFF REPAIR Right 12/22/2018   Procedure: SHOULDER ARTHROSCOPY WITH OPEN ROTATOR CUFF REPAIR;  Surgeon: Corky Mull, MD;  Location: [facility];  Service: Orthopedics;  Laterality: Right;  . TOTAL HIP ARTHROPLASTY Left 09/27/2014   Procedure: TOTAL HIP ARTHROPLASTY ANTERIOR APPROACH;  Surgeon: Hessie Knows, MD;  Location: [facility];  Service: Orthopedics;  Laterality: Left;  . TOTAL KNEE ARTHROPLASTY Left 12/01/2014   Procedure: TOTAL KNEE ARTHROPLASTY;  Surgeon: Hessie Knows, MD;  Location: [facility];  Service: Orthopedics;  Laterality: Left;    Family History  Problem Relation Age of Onset  . Arthritis Mother   . Hyperlipidemia Mother   . Hypertension Mother   . Diabetes Mother   . Sleep apnea Mother   . Obesity Mother   . Arthritis Father   . Hyperlipidemia Father   . Hypertension Father   . Mental illness Father   . Diabetes Father   .  Obesity Father   . Sleep apnea Father   . Bipolar disorder Father   . Anxiety disorder Father   . Depression Father   . Cancer Father   . Kidney disease Father   . Arthritis Maternal Grandmother   . Cancer Maternal Grandmother        breast cancer  . Hyperlipidemia Maternal Grandmother   . Hypertension Maternal Grandmother   . Breast cancer Maternal Grandmother   . Arthritis Maternal Grandfather   . Hyperlipidemia Maternal Grandfather   . Hypertension Maternal Grandfather   . Heart disease Maternal Grandfather        heart attack  . Breast cancer Maternal Grandfather   . Breast cancer Paternal Grandmother     Social History:  reports that she has never smoked. She has never used smokeless tobacco. She reports current alcohol use. She reports that she does not use drugs. The patient lives in [place]. 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 She likes to spend her time dog sitting for family and  friends. The patient is alone today.  Allergies:  Allergies  Allergen Reactions  . Contrast Media [Iodinated Diagnostic Agents] Anaphylaxis    MRI dye  . Etodolac Anaphylaxis  . Alpha-Gal     Flu- like symptoms  . Influenza Vaccines Hives  . Lactose Nausea And Vomiting    Can have yogurt Can not have milk and ice cream   . Lactose Intolerance (Gi) Nausea And Vomiting    Can have yogurt Can not have milk and ice cream    . Bacitracin-Neomycin-Polymyxin Rash  . Neomycin-Bacitracin Zn-Polymyx Rash  . Neosporin + Pain Relief Max St [Neomy-Bacit-Polymyx-Pramoxine] Rash    Current Medications: Current Outpatient Medications  Medication Sig Dispense Refill  . allopurinol (ZYLOPRIM) 300 MG tablet Take 300 mg by mouth daily.    . clobetasol ointment (TEMOVATE) [id number] % Apply 1 application topically at bedtime as needed (inflammation).     . colchicine 0.6 MG tablet Take 0.6 mg by mouth daily.    . cyanocobalamin (,VITAMIN B-12,) 1000 MCG/ML injection Inject 1 mL into the muscle daily.    [name] EPINEPHrine 0.3 mg/0.3 mL IJ SOAJ injection Inject 0.3 mLs (0.3 mg total) into the muscle as needed for anaphylaxis. 1 each 0  . famotidine (PEPCID) 20 MG tablet Take 1 tablet (20 mg total) by mouth daily. 30 tablet 3  . furosemide (LASIX) 40 MG tablet TAKE 1 TABLET(40 MG) BY MOUTH DAILY 90 tablet 1  . gabapentin (NEURONTIN) 300 MG capsule 300 mg 2 (two) times daily.     [name] levothyroxine (SYNTHROID) 137 MCG tablet SMARTSIG:1 Tablet(s) By Mouth Daily    . pantoprazole (PROTONIX) 20 MG tablet Take 1 tablet (20 mg total) by mouth daily. 90 tablet 0  . potassium chloride SA (KLOR-CON) 20 MEQ tablet TAKE 1 TABLET(20 MEQ) BY MOUTH TWICE DAILY 90 tablet 0  . sertraline (ZOLOFT) 50 MG tablet TAKE 1 TABLET(50 MG) BY MOUTH DAILY 90 tablet 1  . triamterene-hydrochlorothiazide (MAXZIDE-25) 37.5-25 MG tablet TAKE 1 TABLET BY MOUTH DAILY 90 tablet 1   No current facility-administered medications for this visit.    Facility-Administered Medications Ordered in Other Visits  Medication Dose Route Frequency Provider Last Rate Last Admin  . 0.9 %  sodium chloride infusion   Intravenous Continuous Lequita Asal, MD 0 mL/hr at 05/08/17 1437 New Bag at 05/11/18 1437  . cyanocobalamin ((VITAMIN B-12)) injection 1,000 mcg  1,000 mcg Intramuscular Once Mckaila Duffus C,  MD        Review of Systems  Constitutional: Positive for weight loss (3 lbs). Negative for chills, diaphoresis, fever and malaise/fatigue.       Feels "good."  HENT: Negative.  Negative for congestion, ear discharge, ear pain, hearing loss, nosebleeds, sinus pain, sore throat and tinnitus.   Eyes: Negative.  Negative for blurred vision, double vision and photophobia.  Respiratory: Negative for cough, sputum production, shortness of breath and wheezing.   Cardiovascular: Negative.  Negative for chest pain, palpitations and leg swelling.  Gastrointestinal: Negative.  Negative for abdominal pain, blood in stool, constipation, diarrhea, heartburn, melena, nausea and vomiting.  Genitourinary: Negative.  Negative for dysuria, frequency, hematuria and urgency.  Musculoskeletal: Positive for joint pain (right shoulder, s/p shoulder replacement). Negative for back pain and myalgias.  Skin: Negative.  Negative for rash.  Neurological: Positive for sensory change (restless legs). Negative for dizziness, tingling, speech change, focal weakness, weakness and headaches.  Endo/Heme/Allergies: Does not bruise/bleed easily.       Thyroid disease on Synthroid.   Psychiatric/Behavioral: Negative.  Negative for depression and substance abuse. The patient is not nervous/anxious and does not have insomnia.   All other systems reviewed and are negative.  Performance status (ECOG): 0-1  Vitals Blood pressure (!) 147/64, pulse 69, temperature (!) 96.7 F (35.9 C), resp. rate 20, weight 246 lb 2.3 oz (111.7 kg), SpO2 100 %.   Physical Exam Vitals and  nursing note reviewed.  Constitutional:      General: She is not in acute distress.    Appearance: She is well-developed. She is not diaphoretic.     Interventions: Face mask in place.  HENT:     Head: Normocephalic and atraumatic.     Comments: Short brown hair.  Mask.    Mouth/Throat:     Mouth: Mucous membranes are moist.     Pharynx: Oropharynx is clear.  Eyes:     General: No scleral icterus.    Extraocular Movements: Extraocular movements intact.     Conjunctiva/sclera: Conjunctivae normal.     Pupils: Pupils are equal, round, and reactive to light.     Comments: Blue eyes.  Neck:     Vascular: No JVD.  Cardiovascular:     Rate and Rhythm: Normal rate and regular rhythm.     Pulses: Normal pulses.     Heart sounds: Normal heart sounds. No murmur heard. No friction rub. No gallop.   Pulmonary:     Effort: Pulmonary effort is normal. No respiratory distress.     Breath sounds: Normal breath sounds. No wheezing or rales.  Chest:     Chest wall: No tenderness.  Breasts:     Right: No supraclavicular adenopathy.     Left: No supraclavicular adenopathy.    Abdominal:     General: Bowel sounds are normal. There is no distension.     Palpations: Abdomen is soft. There is no mass.     Tenderness: There is no abdominal tenderness. There is no guarding or rebound.  Musculoskeletal:        General: Normal range of motion.     Cervical back: Normal range of motion and neck supple.  Lymphadenopathy:     Head:     Right side of head: No preauricular, posterior auricular or occipital adenopathy.     Left side of head: No preauricular, posterior auricular or occipital adenopathy.     Cervical: No cervical adenopathy.     Upper Body:  Right upper body: No supraclavicular adenopathy.     Left upper body: No supraclavicular adenopathy.     Lower Body: No right inguinal adenopathy. No left inguinal adenopathy.  Skin:    General: Skin is warm and dry.     Coloration: Skin is  not pale.     Findings: No erythema.  Neurological:     Mental Status: She is alert and oriented to person, place, and time. Mental status is at baseline.  Psychiatric:        Mood and Affect: Mood normal.        Behavior: Behavior normal.        Thought Content: Thought content normal.        Judgment: Judgment normal.    Appointment on 03/27/2020  Component Date Value Ref Range Status  . WBC 03/27/2020 5.2  4.0 - 10.5 K/uL Final  . RBC 03/27/2020 4.44  3.87 - 5.11 MIL/uL Final  . Hemoglobin 03/27/2020 12.9  12.0 - 15.0 g/dL Final  . HCT 03/27/2020 39.8  36.0 - 46.0 % Final  . MCV 03/27/2020 89.6  80.0 - 100.0 fL Final  . MCH 03/27/2020 29.1  26.0 - 34.0 pg Final  . MCHC 03/27/2020 32.4  30.0 - 36.0 g/dL Final  . RDW 03/27/2020 13.0  11.5 - 15.5 % Final  . Platelets 03/27/2020 279  150 - 400 K/uL Final  . nRBC 03/27/2020 0.0  0.0 - 0.2 % Final  . Neutrophils Relative % 03/27/2020 50  % Final  . Neutro Abs 03/27/2020 2.6  1.7 - 7.7 K/uL Final  . Lymphocytes Relative 03/27/2020 37  % Final  . Lymphs Abs 03/27/2020 1.9  0.7 - 4.0 K/uL Final  . Monocytes Relative 03/27/2020 7  % Final  . Monocytes Absolute 03/27/2020 0.4  0.1 - 1.0 K/uL Final  . Eosinophils Relative 03/27/2020 5  % Final  . Eosinophils Absolute 03/27/2020 0.2  0.0 - 0.5 K/uL Final  . Basophils Relative 03/27/2020 1  % Final  . Basophils Absolute 03/27/2020 0.1  0.0 - 0.1 K/uL Final  . Immature Granulocytes 03/27/2020 0  % Final  . Abs Immature Granulocytes 03/27/2020 0.01  0.00 - 0.07 K/uL Final   Performed at [facility], [address], [place], [place] [address]  . Iron 03/27/2020 47  28 - 170 ug/dL Final  . TIBC 03/27/2020 396  250 - 450 ug/dL Final  . Saturation Ratios 03/27/2020 12  10.4 - 31.8 % Final  . UIBC 03/27/2020 349  ug/dL Final   Performed at [facility], [address], [place], [place] [address]  . Ferritin 03/27/2020 15  11 - 307 ng/mL Final   Performed at [facility], [address] [place], [place] [address]    Assessment:  Lori Le is a 54 y.o. female s/p gastric bypass surgery(2011) with iron deficiency anemia. She has a history of bleeding ulcerin 10/2015. She hadheavy menses. She underwent uterine ablationon 08/19/2016. She bruises easily.  EGDon 12/05/2015 revealed edema and erythema of the gastrojejunal anastomosis. She was treated with Carafate and Protonix. Repeat EGDon 02/06/2016 revealed minimal erythema on the tips of some gastric folds.  EGDon 05/11/2018 revealed salmon-colored mucosa suspicious for short-segment Barrett's esophagus.Pathology revealed squamocolumnar mucosa with features of mild reflux gastroesophagitis. There was no dysplasia or malignancy. Gastric bypasswith gastrojejunal anastomosiswascharacterized by ulceration.Pathology revealed oxyntic mucosa with mild oxyntic gland hyperplasia. There was no malignancy, dysplasia, or H pylori. Examined jejunumwas normal.  EGD on  09/08/2018 revealed salmon-colored mucosa suspicious for short-segment Barrett's esophagus. There were a few gastric polyps. Roux-en-Y gastrojejunostomy with gastrojejunal anastomosis characterized by healthy appearing mucosa and an intact appearance was noted. Normal examined jejunum.  Pathology revealed gastric xanthoma, fundic gland polyp, and oxyntic gland hyperplasia, consistent with proton pump inhibitor effect and negative for H pylori, intestinal metaplasia, dysplasia, and malignancy. There was squamocolumnar mucosa with mild chronic inflammation.   Colonoscopyon 05/11/2018 revealed one 10 mm polyp in the cecum(tubular adenoma),one 3 mm polyp in the transverse colon,one 7 mm polyp in the transverse colon,and one 6 mm polyp in the transverse colon(all fragments of tubular adenomas). Repeat colonoscopy in 3 years planned.  Dietis good. She denies any ice pica. She is intolerant of oral ironsecondary to severe  constipation. She has B12 deficiency. B12 was 168 on 12/29/2012, 367 on 11/29/2015, and 252 on 10/24/2016. She restarted B12 (last in clinic 10/16/2017). She receives B12 injections monthly at home.  Folate was 20 on 12/23/2019.  Workup on 12/08/2017revealed a hematocrit of 30.1, hemoglobin 9.1, MCV 70.7, platelets 331,000, WBC 8200 with an ANC of 5800. Ferritinwas 5. PT and PTT were normal. Platelet function assay was normal.  She received Venofer200 mg IV weekly x 4 (03/15/2016 - 04/05/2016), x 2 (07/23/2016 and 08/05/2016), x 3 (01/29/2017 - 02/12/2017), x 2 (05/01/2017 - 05/08/2017), x 2 (08/04/2017 - 08/13/2017), x2 (05/25/2018 - 06/02/2018), x3 (11/25/2018 - 12/16/2018), and 09/27/2019. She receives Venofer if her ferritin is < 30.  Ferritinhas been followed: 8 on 01/24/2017, 20 on 04/28/2017, 18 on 07/28/2017, 135 on 09/12/2017, 48 on 10/27/2017, 53 on 01/27/2018, 33 on 05/22/2018, 38 on 08/25/2018, 23 on 11/18/2018, 86 on 02/17/2019, 32 on 05/24/2019, 47 on 07/21/2019, 52.5 on 08/05/2019, 20 on 09/22/2019, 26 on 12/23/2019, and 15 on 03/27/2020.   She has a family history of breast cancer. Screening bilateral mammogramon 10/05/2019 revealed no evidence of malignancy.  She has not received her COVID-19 vaccines.  Symptomatically, she has felt "good." She restarted her at home vitamin B12 injections.  She still has restless legs. She denies ice pica.  Exam is stable.  Plan: 1.   Review labs from 03/27/2020. 2. Iron deficiency anemia Hematocrit 39.8 Hemoglobin 12.9. MCV 89.6 on 03/27/2020.  Ferritin 15 with an iron saturation of 12% and a TIBC 396. She denies any bleeding.  Venofer today and weekly x1 (total 2). 3. B12 deficiency B12 injections are performed monthly at home around the 16th.  Continue monthly B12.  Folate was  20.0 on 12/23/2019.  Check folate annually. 4.   Free light chains  Review interval  evalauation at [facility] for elevated free light chains.  No clonality noted on SPEP and UPEP.    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 5.   Venofer today and weekly x 1 (total 2). 6.   RTC in 3 months for labs (CBC with diff, ferritin).   7.   RTC in 6 months for MD assessment, labs (CBC with diff, ferritin, iron studies- day before) and +/- Venofer.  I discussed the assessment and treatment plan with the patient.  The patient was provided an opportunity to ask questions and all were answered.  The patient agreed with the plan and demonstrated an understanding of the instructions.  The patient was advised to call back if the symptoms worsen or if the condition fails to improve as anticipated.   Lequita Asal, MD, PhD 03/28/2020, 9:10 AM  I, Raquel Sarna  J Tufford, am acting as Education administrator for [employer]. Mike Gip, MD, PhD.  I, Jermya Dowding C. Mike Gip, MD, have reviewed the above documentation for accuracy and completeness, and I agree with the above.

## 2020-03-28 ENCOUNTER — Inpatient Hospital Stay (HOSPITAL_BASED_OUTPATIENT_CLINIC_OR_DEPARTMENT_OTHER): Payer: Managed Care, Other (non HMO) | Admitting: Hematology and Oncology

## 2020-03-28 ENCOUNTER — Encounter: Payer: Self-pay | Admitting: Hematology and Oncology

## 2020-03-28 ENCOUNTER — Inpatient Hospital Stay: Payer: Managed Care, Other (non HMO)

## 2020-03-28 VITALS — BP 147/64 | HR 69 | Temp 96.7°F | Resp 20 | Wt 246.1 lb

## 2020-03-28 VITALS — BP 123/84

## 2020-03-28 DIAGNOSIS — E538 Deficiency of other specified B group vitamins: Secondary | ICD-10-CM | POA: Diagnosis not present

## 2020-03-28 DIAGNOSIS — D508 Other iron deficiency anemias: Secondary | ICD-10-CM | POA: Diagnosis not present

## 2020-03-28 DIAGNOSIS — D509 Iron deficiency anemia, unspecified: Secondary | ICD-10-CM | POA: Diagnosis not present

## 2020-03-28 DIAGNOSIS — R768 Other specified abnormal immunological findings in serum: Secondary | ICD-10-CM

## 2020-03-28 DIAGNOSIS — K9589 Other complications of other bariatric procedure: Secondary | ICD-10-CM

## 2020-03-28 DIAGNOSIS — D500 Iron deficiency anemia secondary to blood loss (chronic): Secondary | ICD-10-CM

## 2020-03-28 LAB — KAPPA/LAMBDA LIGHT CHAINS
Kappa free light chain: 30.2 mg/L — ABNORMAL HIGH (ref 3.3–19.4)
Kappa, lambda light chain ratio: 1.66 — ABNORMAL HIGH (ref 0.26–1.65)
Lambda free light chains: 18.2 mg/L (ref 5.7–26.3)

## 2020-03-28 MED ORDER — SODIUM CHLORIDE 0.9 % IV SOLN: 200.0000 mg | Freq: Once | INTRAVENOUS | Status: DC

## 2020-03-28 MED ORDER — IRON SUCROSE 20 MG/ML IV SOLN
200.0000 mg | Freq: Once | INTRAVENOUS | Status: AC
  Administered 2020-03-28: 09:00:00 200 mg via INTRAVENOUS
  Filled 2020-03-28: qty 10

## 2020-03-28 MED ORDER — SODIUM CHLORIDE 0.9 % IV SOLN
INTRAVENOUS | Status: DC
  Filled 2020-03-28: qty 250

## 2020-03-28 NOTE — Progress Notes (Signed)
Pt received prescribed treatment in clinic, pt stable at d/c. 

## 2020-04-04 ENCOUNTER — Ambulatory Visit: Payer: Managed Care, Other (non HMO)

## 2020-04-06 ENCOUNTER — Telehealth: Payer: Self-pay | Admitting: Hematology and Oncology

## 2020-04-06 ENCOUNTER — Inpatient Hospital Stay: Payer: Managed Care, Other (non HMO)

## 2020-04-07 NOTE — Telephone Encounter (Signed)
Pt. called about rescheduling appt. due to a previous appt. scheduled. She asked me to reschedule appt.

## 2020-04-10 ENCOUNTER — Inpatient Hospital Stay: Payer: Managed Care, Other (non HMO) | Attending: Hematology and Oncology

## 2020-04-10 ENCOUNTER — Other Ambulatory Visit: Payer: Self-pay

## 2020-04-10 VITALS — BP 138/68 | HR 68 | Temp 96.4°F | Resp 16

## 2020-04-10 DIAGNOSIS — D509 Iron deficiency anemia, unspecified: Secondary | ICD-10-CM | POA: Insufficient documentation

## 2020-04-10 DIAGNOSIS — D500 Iron deficiency anemia secondary to blood loss (chronic): Secondary | ICD-10-CM

## 2020-04-10 MED ORDER — IRON SUCROSE 20 MG/ML IV SOLN
200.0000 mg | Freq: Once | INTRAVENOUS | Status: AC
  Administered 2020-04-10: 200 mg via INTRAVENOUS
  Filled 2020-04-10: qty 10

## 2020-04-10 MED ORDER — SODIUM CHLORIDE 0.9 % IV SOLN: 200.0000 mg | Freq: Once | INTRAVENOUS | Status: DC

## 2020-04-10 MED ORDER — SODIUM CHLORIDE 0.9 % IV SOLN
Freq: Once | INTRAVENOUS | Status: AC
  Filled 2020-04-10: qty 250

## 2020-04-10 NOTE — Progress Notes (Signed)
Patient received prescribed treatment in clinic. IV Venofer. Tolerated well. Patient stable at discharge. 

## 2020-04-11 ENCOUNTER — Ambulatory Visit: Payer: Managed Care, Other (non HMO)

## 2020-04-19 ENCOUNTER — Other Ambulatory Visit: Payer: Self-pay | Admitting: Internal Medicine

## 2020-04-23 DIAGNOSIS — R768 Other specified abnormal immunological findings in serum: Secondary | ICD-10-CM | POA: Insufficient documentation

## 2020-04-23 DIAGNOSIS — R7689 Other specified abnormal immunological findings in serum: Secondary | ICD-10-CM | POA: Insufficient documentation

## 2020-05-06 ENCOUNTER — Other Ambulatory Visit: Payer: Self-pay | Admitting: Internal Medicine

## 2020-06-02 ENCOUNTER — Other Ambulatory Visit: Payer: Self-pay

## 2020-06-02 MED ORDER — PANTOPRAZOLE SODIUM 20 MG PO TBEC: 20.0000 mg | DELAYED_RELEASE_TABLET | Freq: Every day | ORAL | 0 refills | Status: DC

## 2020-06-26 ENCOUNTER — Inpatient Hospital Stay: Payer: Managed Care, Other (non HMO)

## 2020-06-27 ENCOUNTER — Other Ambulatory Visit: Payer: Managed Care, Other (non HMO)

## 2020-06-29 ENCOUNTER — Other Ambulatory Visit: Payer: Self-pay

## 2020-06-29 ENCOUNTER — Inpatient Hospital Stay: Payer: Managed Care, Other (non HMO) | Attending: Hematology and Oncology

## 2020-06-29 DIAGNOSIS — D509 Iron deficiency anemia, unspecified: Secondary | ICD-10-CM | POA: Diagnosis present

## 2020-06-29 DIAGNOSIS — K9589 Other complications of other bariatric procedure: Secondary | ICD-10-CM

## 2020-06-29 DIAGNOSIS — D508 Other iron deficiency anemias: Secondary | ICD-10-CM

## 2020-06-29 LAB — CBC WITH DIFFERENTIAL/PLATELET
Abs Immature Granulocytes: 0.01 10*3/uL (ref 0.00–0.07)
Basophils Absolute: 0.1 10*3/uL (ref 0.0–0.1)
Basophils Relative: 1 %
Eosinophils Absolute: 0.2 10*3/uL (ref 0.0–0.5)
Eosinophils Relative: 2 %
HCT: 45.2 % (ref 36.0–46.0)
Hemoglobin: 15 g/dL (ref 12.0–15.0)
Immature Granulocytes: 0 %
Lymphocytes Relative: 36 %
Lymphs Abs: 2.3 10*3/uL (ref 0.7–4.0)
MCH: 29.3 pg (ref 26.0–34.0)
MCHC: 33.2 g/dL (ref 30.0–36.0)
MCV: 88.3 fL (ref 80.0–100.0)
Monocytes Absolute: 0.4 10*3/uL (ref 0.1–1.0)
Monocytes Relative: 7 %
Neutro Abs: 3.5 10*3/uL (ref 1.7–7.7)
Neutrophils Relative %: 54 %
Platelets: 317 10*3/uL (ref 150–400)
RBC: 5.12 MIL/uL — ABNORMAL HIGH (ref 3.87–5.11)
RDW: 13.4 % (ref 11.5–15.5)
WBC: 6.4 10*3/uL (ref 4.0–10.5)
nRBC: 0 % (ref 0.0–0.2)

## 2020-06-29 LAB — FERRITIN: Ferritin: 55 ng/mL (ref 11–307)

## 2020-07-11 ENCOUNTER — Other Ambulatory Visit: Payer: Self-pay | Admitting: Gastroenterology

## 2020-07-18 ENCOUNTER — Other Ambulatory Visit: Payer: Self-pay | Admitting: Hematology and Oncology

## 2020-08-08 ENCOUNTER — Encounter: Payer: Self-pay | Admitting: Internal Medicine

## 2020-08-08 ENCOUNTER — Ambulatory Visit (INDEPENDENT_AMBULATORY_CARE_PROVIDER_SITE_OTHER): Payer: Managed Care, Other (non HMO) | Admitting: Internal Medicine

## 2020-08-08 ENCOUNTER — Other Ambulatory Visit: Payer: Self-pay

## 2020-08-08 DIAGNOSIS — R739 Hyperglycemia, unspecified: Secondary | ICD-10-CM

## 2020-08-08 DIAGNOSIS — D509 Iron deficiency anemia, unspecified: Secondary | ICD-10-CM

## 2020-08-08 DIAGNOSIS — F32A Depression, unspecified: Secondary | ICD-10-CM

## 2020-08-08 DIAGNOSIS — R911 Solitary pulmonary nodule: Secondary | ICD-10-CM

## 2020-08-08 DIAGNOSIS — M7521 Bicipital tendinitis, right shoulder: Secondary | ICD-10-CM

## 2020-08-08 DIAGNOSIS — D508 Other iron deficiency anemias: Secondary | ICD-10-CM

## 2020-08-08 DIAGNOSIS — E7800 Pure hypercholesterolemia, unspecified: Secondary | ICD-10-CM

## 2020-08-08 DIAGNOSIS — M25511 Pain in right shoulder: Secondary | ICD-10-CM

## 2020-08-08 DIAGNOSIS — K9589 Other complications of other bariatric procedure: Secondary | ICD-10-CM

## 2020-08-08 DIAGNOSIS — E039 Hypothyroidism, unspecified: Secondary | ICD-10-CM | POA: Diagnosis not present

## 2020-08-08 DIAGNOSIS — K219 Gastro-esophageal reflux disease without esophagitis: Secondary | ICD-10-CM

## 2020-08-08 DIAGNOSIS — I10 Essential (primary) hypertension: Secondary | ICD-10-CM

## 2020-08-08 DIAGNOSIS — F320 Major depressive disorder, single episode, mild: Secondary | ICD-10-CM

## 2020-08-08 DIAGNOSIS — R918 Other nonspecific abnormal finding of lung field: Secondary | ICD-10-CM

## 2020-08-08 DIAGNOSIS — L4050 Arthropathic psoriasis, unspecified: Secondary | ICD-10-CM

## 2020-08-08 DIAGNOSIS — Z6836 Body mass index (BMI) 36.0-36.9, adult: Secondary | ICD-10-CM

## 2020-08-08 NOTE — Progress Notes (Signed)
Patient ID: Lori Le, female   DOB: [birth year], 55 y.o.   MRN: [id number]   Subjective:    Patient ID: Lori Le, female    DOB: 09/19/1965, 55 y.o.   MRN: [id number]  HPI This visit occurred during the SARS-CoV-2 public health emergency.  Safety protocols were in place, including screening questions prior to the visit, additional usage of staff PPE, and extensive cleaning of exam room while observing appropriate contact time as indicated for disinfecting solutions.  Patient here for a scheduled follow up.  She is s/p reverse arthroplasty of right shoulder.  Seeing ortho.  Recommended injection.  Still with pain.  Pain in right knee as well.  Affects sleep.  No chest pain.  Breathing stable.  Was off protonix.  Had acid reflux off medication.  No vomiting reported.  No abdominal pain.  Bowels moving. Increased stress.  Her and her mother live together.  Discussed.  Also incidentally noted 20m RUL pulmonary nodule.  dsicussed f/u with her.  Will schedule chest Ct given exam with of her shoulder.    Past Medical History:  Diagnosis Date  . Allergic rhinitis due to allergen   . Allergy   . Anemia   . Anxiety   . Arthritis   . B12 deficiency   . Cancer ([place]    skin ca-melanoma  . Chronic idiopathic urticaria   . Complication of anesthesia    nausea, slow to wake up  . Depression   . Edema   . Family history of adverse reaction to anesthesia    Father - PONV  . Gastric ulcer   . GERD (gastroesophageal reflux disease)   . Gout   . Gout   . Hashimoto's thyroiditis   . Heart murmur   . History of eating disorder   . History of kidney stones    h/o years ago  . Hives   . Hyperlipidemia   . Hypertension   . Hypothyroidism   . Lower extremity edema   . Melanoma in situ ([place]    left shoulder  . Migraine    approx 4x/yr-migraines  . Mitral valve disorder   . Motion sickness    all moving vehicles  . Neuropathy    fingers and feet  . PCOS (polycystic ovarian syndrome)    . PONV (postoperative nausea and vomiting)   . Psoriatic arthritis ([place]   . Thyroid disease   . Ulcer   . Vertigo    last episode over 1 yr ago   Past Surgical History:  Procedure Laterality Date  . BONE EXCISION Left 09/03/2018   Procedure: PART EXCISION BONE-PHALANX 2,3,4 LEFT;  Surgeon: TAlbertine Patricia DPM;  [facility]  Service: Podiatry;  Laterality: Left;  . BREAST BIOPSY Right 2007   neg- bx/clip  . CHOLECYSTECTOMY  2003  . COLONOSCOPY WITH PROPOFOL N/A 05/11/2018   Procedure: COLONOSCOPY WITH PROPOFOL;  Surgeon: TVirgel Manifold MD;  Location: [facility];  Service: Endoscopy;  Laterality: N/A;  . DILITATION & CURRETTAGE/HYSTROSCOPY WITH NOVASURE ABLATION N/A 08/19/2016   Procedure: DILATATION & CURETTAGE/HYSTEROSCOPY WITH NOVASURE ABLATION;  Surgeon: CRubie Maid MD;  Location: [facility];  Service: Gynecology;  Laterality: N/A;  . ESOPHAGOGASTRODUODENOSCOPY (EGD) WITH PROPOFOL N/A 12/05/2015   Procedure: ESOPHAGOGASTRODUODENOSCOPY (EGD) WITH PROPOFOL;  Surgeon: MLollie Sails MD;  [facility];  Service: Endoscopy;  Laterality: N/A;  . ESOPHAGOGASTRODUODENOSCOPY (EGD) WITH PROPOFOL N/A 02/06/2016   Procedure: ESOPHAGOGASTRODUODENOSCOPY (EGD) WITH PROPOFOL;  Surgeon: MLollie Sails MD;  Location: [facility];  Service: Endoscopy;  Laterality: N/A;  . ESOPHAGOGASTRODUODENOSCOPY (EGD) WITH PROPOFOL N/A 05/11/2018   Procedure: ESOPHAGOGASTRODUODENOSCOPY (EGD) WITH PROPOFOL;  Surgeon: [name], MD;  Location: [facility];  Service: Endoscopy;  Laterality: N/A;  . ESOPHAGOGASTRODUODENOSCOPY (EGD) WITH PROPOFOL N/A 09/08/2018   Procedure: ESOPHAGOGASTRODUODENOSCOPY (EGD) WITH BIOPSY;  Surgeon: [name], MD;  Location: [place];  Service: Endoscopy;  Laterality: N/A;  . FOOT ARTHRODESIS Left 09/03/2018   Procedure: ARTHRODESIS; HALLUX/IP JOINT LEFT;  Surgeon: Albertine Patricia, DPM;  Location: [place];  Service: Podiatry;  Laterality: Left;  LMA LOCAL  . FOOT SURGERY  1994  . GASTRIC BYPASS  2011  . JOINT REPLACEMENT Left 09/27/2014   HIP  . JOINT REPLACEMENT Left 12/01/2014   KNEE  . KNEE ARTHROSCOPY Right   . KNEE ARTHROSCOPY Left 03/23/2015   Procedure: ARTHROSCOPY KNEE, partial synovectomy;  Surgeon: Hessie Knows, MD;  Location: [facility];  Service: Orthopedics;  Laterality: Left;  . LAPAROSCOPIC GASTRIC BANDING  2010  . POLYPECTOMY N/A 09/08/2018   Procedure: POLYPECTOMY;  Surgeon: [name], MD;  Location: [place];  Service: Endoscopy;  Laterality: N/A;  Stomach  . ROTATOR CUFF REPAIR Bilateral    right shoulder 07-26-2016  . SHOULDER ARTHROSCOPY Left 06/20/2015   Procedure: ARTHROSCOPY SHOULDER, REPAIR OF MASSIVE ROTATOR CUFF TEAR, TENODESIS, DECOMPRESSION DEBRIDEMENT;  Surgeon: Corky Mull, MD;  Location: [facility];  Service: Orthopedics;  Laterality: Left;  . SHOULDER ARTHROSCOPY WITH OPEN ROTATOR CUFF REPAIR Right 12/22/2018   Procedure: SHOULDER ARTHROSCOPY WITH OPEN ROTATOR CUFF REPAIR;  Surgeon: Corky Mull, MD;  Location: [facility];  Service: Orthopedics;  Laterality: Right;  . TOTAL HIP ARTHROPLASTY Left 09/27/2014   Procedure: TOTAL HIP ARTHROPLASTY ANTERIOR APPROACH;  Surgeon: Hessie Knows, MD;  Location: [facility];  Service: Orthopedics;  Laterality: Left;  . TOTAL KNEE ARTHROPLASTY Left 12/01/2014   Procedure: TOTAL KNEE ARTHROPLASTY;  Surgeon: Hessie Knows, MD;  Location: [facility];  Service: Orthopedics;  Laterality: Left;   Family History  Problem Relation Age of Onset  . Arthritis Mother   . Hyperlipidemia Mother   . Hypertension Mother   . Diabetes Mother   . Sleep apnea Mother   . Obesity Mother   . Arthritis Father   . Hyperlipidemia Father   . Hypertension Father   . Mental illness Father   . Diabetes Father   . Obesity Father   . Sleep apnea Father   . Bipolar disorder Father   . Anxiety disorder Father   . Depression Father    . Cancer Father   . Kidney disease Father   . Arthritis Maternal Grandmother   . Cancer Maternal Grandmother        breast cancer  . Hyperlipidemia Maternal Grandmother   . Hypertension Maternal Grandmother   . Breast cancer Maternal Grandmother   . Arthritis Maternal Grandfather   . Hyperlipidemia Maternal Grandfather   . Hypertension Maternal Grandfather   . Heart disease Maternal Grandfather        heart attack  . Breast cancer Maternal Grandfather   . Breast cancer Paternal Grandmother    Social History   Socioeconomic History  . Marital status: Divorced    Spouse name: Not on file  . Number of children: Not on file  . Years of education: Not on file  . Highest education level: Not on file  Occupational History  . Occupation: Chiropractor  Tobacco Use  . Smoking status:  Never Smoker  . Smokeless tobacco: Never Used  Vaping Use  . Vaping Use: Never used  Substance and Sexual Activity  . Alcohol use: Yes    Alcohol/week: 0.0 - 1.0 standard drinks    Comment: occ.  . Drug use: No  . Sexual activity: Yes    Partners: Male    Birth control/protection: Surgical    Comment: Ablation   Other Topics Concern  . Not on file  Social History Narrative  . Not on file   Social Determinants of Health   Financial Resource Strain: Not on file  Food Insecurity: Not on file  Transportation Needs: Not on file  Physical Activity: Not on file  Stress: Not on file  Social Connections: Not on file    Outpatient Encounter Medications as of 08/08/2020  Medication Sig  . allopurinol (ZYLOPRIM) 300 MG tablet Take 300 mg by mouth daily.  . cyanocobalamin (,VITAMIN B-12,) 1000 MCG/ML injection Inject 1 mL into the muscle daily.  [name] EPINEPHrine 0.3 mg/0.3 mL IJ SOAJ injection Inject 0.3 mLs (0.3 mg total) into the muscle as needed for anaphylaxis.  . furosemide (LASIX) 40 MG tablet TAKE 1 TABLET(40 MG) BY MOUTH DAILY  . gabapentin (NEURONTIN) 300 MG capsule 300 mg 2 (two) times  daily.   [name] levothyroxine (SYNTHROID) 137 MCG tablet SMARTSIG:1 Tablet(s) By Mouth Daily  . pantoprazole (PROTONIX) 20 MG tablet Take 1 tablet (20 mg total) by mouth daily.  . potassium chloride SA (KLOR-CON) 20 MEQ tablet TAKE 2 TABLETS BY MOUTH DAILY IN THE MORNING AND 1 TABLET BY MOUTH IN THE EVENING  . sertraline (ZOLOFT) 50 MG tablet TAKE 1 TABLET(50 MG) BY MOUTH DAILY  . triamterene-hydrochlorothiazide (MAXZIDE-25) 37.5-25 MG tablet TAKE 1 TABLET BY MOUTH DAILY  . clobetasol ointment (TEMOVATE) 7.89 % Apply 1 application topically at bedtime as needed (inflammation).   . colchicine 0.6 MG tablet Take 0.6 mg by mouth daily.  . famotidine (PEPCID) 20 MG tablet Take 1 tablet (20 mg total) by mouth daily.   Facility-Administered Encounter Medications as of 08/08/2020  Medication  . 0.9 %  sodium chloride infusion  . cyanocobalamin ((VITAMIN B-12)) injection 1,000 mcg    Review of Systems  Constitutional: Negative for appetite change and unexpected weight change.  HENT: Negative for congestion and sinus pressure.   Respiratory: Negative for cough, chest tightness and shortness of breath.   Cardiovascular: Negative for chest pain and palpitations.       No increased leg swelling.  Improved.   Gastrointestinal: Negative for abdominal pain, diarrhea, nausea and vomiting.  Genitourinary: Negative for difficulty urinating and dysuria.  Musculoskeletal: Negative for myalgias.       Increased pain right shoulder.   Skin: Negative for color change and rash.  Neurological: Negative for dizziness, light-headedness and headaches.  Psychiatric/Behavioral: Negative for agitation and dysphoric mood.       Objective:    Physical Exam Vitals reviewed.  Constitutional:      General: She is not in acute distress.    Appearance: Normal appearance.  HENT:     Head: Normocephalic and atraumatic.     Right Ear: External ear normal.     Left Ear: External ear normal.  Eyes:     General: No  scleral icterus.       Right eye: No discharge.        Left eye: No discharge.     Conjunctiva/sclera: Conjunctivae normal.  Neck:     Thyroid: No thyromegaly.  Cardiovascular:  Rate and Rhythm: Normal rate and regular rhythm.  Pulmonary:     Effort: No respiratory distress.     Breath sounds: Normal breath sounds. No wheezing.  Abdominal:     General: Bowel sounds are normal.     Palpations: Abdomen is soft.     Tenderness: There is no abdominal tenderness.  Musculoskeletal:        General: No swelling or tenderness.     Cervical back: Neck supple. No tenderness.  Lymphadenopathy:     Cervical: No cervical adenopathy.  Skin:    Findings: No erythema or rash.  Neurological:     Mental Status: She is alert.  Psychiatric:        Mood and Affect: Mood normal.        Behavior: Behavior normal.     BP 112/72 (BP Location: Left Arm, Patient Position: Sitting)   Pulse 82   Temp (!) 97.5 F (36.4 C)   Ht 5' 7.99" (1.727 m)   Wt 241 lb 6.4 oz (109.5 kg)   SpO2 99%   BMI 36.71 kg/m  Wt Readings from Last 3 Encounters:  08/08/20 241 lb 6.4 oz (109.5 kg)  03/28/20 246 lb 2.3 oz (111.7 kg)  03/07/20 244 lb (110.7 kg)     Lab Results  Component Value Date   WBC 6.4 06/29/2020   HGB 15.0 06/29/2020   HCT 45.2 06/29/2020   PLT 317 06/29/2020   GLUCOSE 100 (H) 08/05/2019   CHOL 242 (H) 05/10/2019   TRIG 211 (H) 05/10/2019   HDL 56 05/10/2019   LDLDIRECT 106.0 05/26/2018   LDLCALC 148 (H) 05/10/2019   ALT 9 05/10/2019   AST 8 05/10/2019   NA 138 08/05/2019   K 3.7 08/05/2019   CL 97 08/05/2019   CREATININE 0.70 08/05/2019   BUN 16 08/05/2019   CO2 31 08/05/2019   TSH 0.57 05/26/2018   INR 0.99 03/08/2016   HGBA1C 5.9 (H) 10/27/2018    MM 3D SCREEN BREAST BILATERAL  Result Date: 10/05/2019 CLINICAL DATA:  Screening. EXAM: DIGITAL SCREENING BILATERAL MAMMOGRAM WITH TOMO AND CAD COMPARISON:  Previous exam(s). ACR Breast Density Category b: There are scattered  areas of fibroglandular density. FINDINGS: There are no findings suspicious for malignancy. Images were processed with CAD. IMPRESSION: No mammographic evidence of malignancy. A result letter of this screening mammogram will be mailed directly to the patient. RECOMMENDATION: Screening mammogram in one year. (Code:SM-B-01Y) BI-RADS CATEGORY  1: Negative. Electronically Signed   By: Audie Pinto M.D.   On: 10/05/2019 13:35       Assessment & Plan:   Problem List Items Addressed This Visit    Anemia    Follow cbc and iron.        Class 2 severe obesity with serious comorbidity and body mass index (BMI) of 36.0 to 36.9 in adult [facility])    Diet and exercise.  Follow.        Essential hypertension, benign    On triam/hctz and takes lasix.  Has tried to decreased and has problems with increased swelling.  Blood pressure doing well.  Follow pressures.  Follow metabolic panel.       Relevant Orders   Basic metabolic panel   GERD (gastroesophageal reflux disease)    On problems reported.  On protonix.       Hypercholesterolemia    Low cholesterol diet and exercise.  Follow lipid panel.  Lab Results  Component Value Date   CHOL 242 (H) 05/10/2019  HDL 56 05/10/2019   LDLCALC 148 (H) 05/10/2019   LDLDIRECT 106.0 05/26/2018   TRIG 211 (H) 05/10/2019   CHOLHDL 4.3 05/10/2019  The 10-year ASCVD risk score Mikey Bussing DC Jr., et al., 2013) is: 2.3%   Values used to calculate the score:     Age: 19 years     Sex: Female     Is Non-Hispanic African American: No     Diabetic: No     Tobacco smoker: No     Systolic Blood Pressure: 248 mmHg     Is BP treated: Yes     HDL Cholesterol: 56 mg/dL     Total Cholesterol: 242 mg/dL      Relevant Orders   Lipid panel   Hepatic function panel   Hyperglycemia    Low carb diet and exercise.  Follow met b and a1c.       Relevant Orders   Hemoglobin A1c   Hypothyroidism    On thyroid replacement.  Follow tsh.       Iron deficiency anemia  following bariatric surgery    Follow cbc and ferritin.       Mild depression ([place])    Discussed with her.  Overall appears to be handling things well. Increased stress with her and her mother living together. Zoloft.  Follow.        Psoriatic arthritis ([place])    On cosentyx.  Follow.       Pulmonary nodule    Found incidentally on CT scan.  Check CT chest to better evaluate.       Relevant Orders   CT Chest Wo Contrast   Right shoulder pain    Seeing surgery.       Tendinitis of upper biceps tendon of right shoulder    Seeing ortho.        Other Visit Diagnoses    Abnormal findings on diagnostic imaging of lung       Relevant Orders   CT Chest Wo Contrast       Einar Pheasant, MD

## 2020-08-13 ENCOUNTER — Encounter: Payer: Self-pay | Admitting: Internal Medicine

## 2020-08-13 DIAGNOSIS — M25511 Pain in right shoulder: Secondary | ICD-10-CM | POA: Insufficient documentation

## 2020-08-13 DIAGNOSIS — Z6836 Body mass index (BMI) 36.0-36.9, adult: Secondary | ICD-10-CM | POA: Insufficient documentation

## 2020-08-13 DIAGNOSIS — R911 Solitary pulmonary nodule: Secondary | ICD-10-CM | POA: Insufficient documentation

## 2020-08-13 NOTE — Assessment & Plan Note (Signed)
Diet and exercise.  Follow.  

## 2020-08-13 NOTE — Assessment & Plan Note (Signed)
On triam/hctz and takes lasix.  Has tried to decreased and has problems with increased swelling.  Blood pressure doing well.  Follow pressures.  Follow metabolic panel.

## 2020-08-13 NOTE — Assessment & Plan Note (Signed)
Seeing surgery

## 2020-08-13 NOTE — Assessment & Plan Note (Signed)
Discussed with her.  Overall appears to be handling things well. Increased stress with her and her mother living together. Zoloft.  Follow.

## 2020-08-13 NOTE — Assessment & Plan Note (Signed)
Low cholesterol diet and exercise.  Follow lipid panel.  Lab Results  Component Value Date   CHOL 242 (H) 05/10/2019   HDL 56 05/10/2019   LDLCALC 148 (H) 05/10/2019   LDLDIRECT 106.0 05/26/2018   TRIG 211 (H) 05/10/2019   CHOLHDL 4.3 05/10/2019  The 10-year ASCVD risk score Mikey Bussing DC Jr., et al., 2013) is: 2.3%   Values used to calculate the score:     Age: 55 years     Sex: Female     Is Non-Hispanic African American: No     Diabetic: No     Tobacco smoker: No     Systolic Blood Pressure: 300 mmHg     Is BP treated: Yes     HDL Cholesterol: 56 mg/dL     Total Cholesterol: 242 mg/dL

## 2020-08-13 NOTE — Assessment & Plan Note (Signed)
On cosentyx.  Follow.

## 2020-08-13 NOTE — Assessment & Plan Note (Signed)
Low carb diet and exercise.  Follow met b and a1c.  

## 2020-08-13 NOTE — Assessment & Plan Note (Signed)
Seeing ortho.   

## 2020-08-13 NOTE — Assessment & Plan Note (Signed)
On thyroid replacement.  Follow tsh.  

## 2020-08-13 NOTE — Assessment & Plan Note (Signed)
Follow cbc and ferritin.  

## 2020-08-13 NOTE — Assessment & Plan Note (Signed)
On problems reported.  On protonix.

## 2020-08-13 NOTE — Assessment & Plan Note (Signed)
Found incidentally on CT scan.  Check CT chest to better evaluate.

## 2020-08-13 NOTE — Assessment & Plan Note (Signed)
Follow cbc and iron.

## 2020-08-15 ENCOUNTER — Telehealth: Payer: Self-pay | Admitting: Internal Medicine

## 2020-08-15 NOTE — Telephone Encounter (Signed)
lft pt vm to call ofc to sch CT. thanks

## 2020-08-16 NOTE — Telephone Encounter (Signed)
Patient was returning call about CT scan 

## 2020-08-21 ENCOUNTER — Other Ambulatory Visit: Payer: Self-pay

## 2020-08-21 ENCOUNTER — Other Ambulatory Visit (INDEPENDENT_AMBULATORY_CARE_PROVIDER_SITE_OTHER): Payer: Managed Care, Other (non HMO)

## 2020-08-21 DIAGNOSIS — I10 Essential (primary) hypertension: Secondary | ICD-10-CM

## 2020-08-21 DIAGNOSIS — E7800 Pure hypercholesterolemia, unspecified: Secondary | ICD-10-CM

## 2020-08-21 DIAGNOSIS — R739 Hyperglycemia, unspecified: Secondary | ICD-10-CM

## 2020-08-21 LAB — BASIC METABOLIC PANEL
BUN: 15 mg/dL (ref 6–23)
CO2: 31 mEq/L (ref 19–32)
Calcium: 9.2 mg/dL (ref 8.4–10.5)
Chloride: 101 mEq/L (ref 96–112)
Creatinine, Ser: 0.75 mg/dL (ref 0.40–1.20)
GFR: 90.15 mL/min (ref >60.00)
Glucose, Bld: 99 mg/dL (ref 70–99)
Potassium: 4.1 mEq/L (ref 3.5–5.1)
Sodium: 141 mEq/L (ref 135–145)

## 2020-08-21 LAB — HEPATIC FUNCTION PANEL
ALT: 18 U/L (ref 0–35)
AST: 12 U/L (ref 0–37)
Albumin: 4 g/dL (ref 3.5–5.2)
Alkaline Phosphatase: 64 U/L (ref 39–117)
Bilirubin, Direct: 0.1 mg/dL (ref 0.0–0.3)
Total Bilirubin: 0.5 mg/dL (ref 0.2–1.2)
Total Protein: 6.8 g/dL (ref 6.0–8.3)

## 2020-08-21 LAB — LIPID PANEL
Cholesterol: 198 mg/dL (ref 0–200)
HDL: 49.9 mg/dL (ref >39.00)
NonHDL: 148.03
Total CHOL/HDL Ratio: 4
Triglycerides: 202 mg/dL — ABNORMAL HIGH (ref 0.0–149.0)
VLDL: 40.4 mg/dL — ABNORMAL HIGH (ref 0.0–40.0)

## 2020-08-21 LAB — HEMOGLOBIN A1C: Hgb A1c MFr Bld: 5.7 % (ref 4.6–6.5)

## 2020-08-21 LAB — LDL CHOLESTEROL, DIRECT: Direct LDL: 138 mg/dL

## 2020-09-04 ENCOUNTER — Ambulatory Visit: Payer: Managed Care, Other (non HMO)

## 2020-09-09 ENCOUNTER — Other Ambulatory Visit: Payer: Self-pay | Admitting: Internal Medicine

## 2020-09-20 ENCOUNTER — Encounter: Payer: Self-pay | Admitting: Hematology and Oncology

## 2020-09-22 ENCOUNTER — Other Ambulatory Visit: Payer: Self-pay

## 2020-09-22 DIAGNOSIS — D500 Iron deficiency anemia secondary to blood loss (chronic): Secondary | ICD-10-CM

## 2020-09-25 ENCOUNTER — Other Ambulatory Visit: Payer: Managed Care, Other (non HMO)

## 2020-09-26 ENCOUNTER — Ambulatory Visit: Payer: Managed Care, Other (non HMO) | Admitting: Hematology and Oncology

## 2020-09-26 ENCOUNTER — Inpatient Hospital Stay: Payer: Managed Care, Other (non HMO)

## 2020-09-26 ENCOUNTER — Ambulatory Visit: Payer: Managed Care, Other (non HMO)

## 2020-09-26 ENCOUNTER — Ambulatory Visit: Payer: Managed Care, Other (non HMO) | Admitting: Oncology

## 2020-09-27 ENCOUNTER — Inpatient Hospital Stay: Payer: Managed Care, Other (non HMO)

## 2020-09-27 ENCOUNTER — Inpatient Hospital Stay: Payer: Managed Care, Other (non HMO) | Admitting: Oncology

## 2020-11-08 ENCOUNTER — Other Ambulatory Visit: Payer: Self-pay | Admitting: Internal Medicine

## 2020-11-14 ENCOUNTER — Other Ambulatory Visit: Payer: Self-pay

## 2020-11-14 ENCOUNTER — Ambulatory Visit (INDEPENDENT_AMBULATORY_CARE_PROVIDER_SITE_OTHER): Payer: 59 | Admitting: Internal Medicine

## 2020-11-14 ENCOUNTER — Encounter: Payer: Self-pay | Admitting: Hematology and Oncology

## 2020-11-14 VITALS — BP 126/78 | HR 88 | Temp 97.3°F | Resp 16 | Ht 68.0 in | Wt 246.0 lb

## 2020-11-14 DIAGNOSIS — F32A Depression, unspecified: Secondary | ICD-10-CM

## 2020-11-14 DIAGNOSIS — L4050 Arthropathic psoriasis, unspecified: Secondary | ICD-10-CM

## 2020-11-14 DIAGNOSIS — R739 Hyperglycemia, unspecified: Secondary | ICD-10-CM

## 2020-11-14 DIAGNOSIS — M1612 Unilateral primary osteoarthritis, left hip: Secondary | ICD-10-CM

## 2020-11-14 DIAGNOSIS — K9589 Other complications of other bariatric procedure: Secondary | ICD-10-CM

## 2020-11-14 DIAGNOSIS — E039 Hypothyroidism, unspecified: Secondary | ICD-10-CM | POA: Diagnosis not present

## 2020-11-14 DIAGNOSIS — I10 Essential (primary) hypertension: Secondary | ICD-10-CM | POA: Diagnosis not present

## 2020-11-14 DIAGNOSIS — R200 Anesthesia of skin: Secondary | ICD-10-CM

## 2020-11-14 DIAGNOSIS — Z1231 Encounter for screening mammogram for malignant neoplasm of breast: Secondary | ICD-10-CM

## 2020-11-14 DIAGNOSIS — E538 Deficiency of other specified B group vitamins: Secondary | ICD-10-CM

## 2020-11-14 DIAGNOSIS — R202 Paresthesia of skin: Secondary | ICD-10-CM

## 2020-11-14 DIAGNOSIS — D508 Other iron deficiency anemias: Secondary | ICD-10-CM

## 2020-11-14 DIAGNOSIS — F320 Major depressive disorder, single episode, mild: Secondary | ICD-10-CM

## 2020-11-14 DIAGNOSIS — E7800 Pure hypercholesterolemia, unspecified: Secondary | ICD-10-CM

## 2020-11-14 DIAGNOSIS — M1711 Unilateral primary osteoarthritis, right knee: Secondary | ICD-10-CM

## 2020-11-14 DIAGNOSIS — D509 Iron deficiency anemia, unspecified: Secondary | ICD-10-CM

## 2020-11-14 DIAGNOSIS — Z860100 Personal history of colon polyps, unspecified: Secondary | ICD-10-CM

## 2020-11-14 DIAGNOSIS — K219 Gastro-esophageal reflux disease without esophagitis: Secondary | ICD-10-CM

## 2020-11-14 DIAGNOSIS — E876 Hypokalemia: Secondary | ICD-10-CM

## 2020-11-14 DIAGNOSIS — R911 Solitary pulmonary nodule: Secondary | ICD-10-CM

## 2020-11-14 DIAGNOSIS — Z9884 Bariatric surgery status: Secondary | ICD-10-CM

## 2020-11-14 NOTE — Progress Notes (Signed)
Patient ID: SHAUNDREA CARRIGG, female   DOB: [birth year], 55 y.o.   MRN: [id number]   Subjective:    Patient ID: [name], female    DOB: 1965/12/06, 55 y.o.   MRN: [id number]  HPI This visit occurred during the SARS-CoV-2 public health emergency.  Safety protocols were in place, including screening questions prior to the visit, additional usage of staff PPE, and extensive cleaning of exam room while observing appropriate contact time as indicated for disinfecting solutions.   Patient here for a scheduled follow up.  Here to follow up regarding blood pressure, cholesterol and lower extremity swelling.  She reports she is doing relatively well.  Recently evaluated by ortho for right knee pain.  Has right knee and right hip pain.  Recommended PT.  S/p injection.  Persistent issues with right shoulder - CT.  Seeing endocrinology.  Last seen 10/17/20 - f/u hypothyroidism.  Needs f/u tsh.  Seeing neurology for limb pain and tingling.  On gabapentin.  Stable.  No chest pain or sob reported.  No abdominal pain.  Bowels moving.    Past Medical History:  Diagnosis Date   Allergic rhinitis due to allergen    Allergy    Anemia    Anxiety    Arthritis    B12 deficiency    Cancer (HCC)    skin ca-melanoma   Chronic idiopathic urticaria    Complication of anesthesia    nausea, slow to wake up   Depression    Edema    Family history of adverse reaction to anesthesia    Father - PONV   Gastric ulcer    GERD (gastroesophageal reflux disease)    Gout    Gout    Hashimoto's thyroiditis    Heart murmur    History of eating disorder    History of kidney stones    h/o years ago   Hives    Hyperlipidemia    Hypertension    Hypothyroidism    Lower extremity edema    Melanoma in situ ([place])    left shoulder   Migraine    approx 4x/yr-migraines   Mitral valve disorder    Motion sickness    all moving vehicles   Neuropathy    fingers and feet   PCOS (polycystic ovarian syndrome)    PONV  (postoperative nausea and vomiting)    Psoriatic arthritis (HCC)    Thyroid disease    Ulcer    Vertigo    last episode over 1 yr ago   Past Surgical History:  Procedure Laterality Date   BONE EXCISION Left 09/03/2018   Procedure: PART EXCISION BONE-PHALANX 2,3,4 LEFT;  Surgeon: Albertine Patricia, DPM;  Location: [place];  Service: Podiatry;  Laterality: Left;   BREAST BIOPSY Right 2007   neg- bx/clip   CHOLECYSTECTOMY  2003   COLONOSCOPY WITH PROPOFOL N/A 05/11/2018   Procedure: COLONOSCOPY WITH PROPOFOL;  Surgeon: [name], MD;  Location: [facility];  Service: Endoscopy;  Laterality: N/A;   DILITATION & CURRETTAGE/HYSTROSCOPY WITH NOVASURE ABLATION N/A 08/19/2016   Procedure: DILATATION & CURETTAGE/HYSTEROSCOPY WITH NOVASURE ABLATION;  Surgeon: [name], MD;  Location: [facility];  Service: Gynecology;  Laterality: N/A;   ESOPHAGOGASTRODUODENOSCOPY (EGD) WITH PROPOFOL N/A 12/05/2015   Procedure: ESOPHAGOGASTRODUODENOSCOPY (EGD) WITH PROPOFOL;  Surgeon: Lollie Sails, MD;  Location: [facility];  Service: Endoscopy;  Laterality: N/A;   ESOPHAGOGASTRODUODENOSCOPY (EGD) WITH PROPOFOL N/A 02/06/2016   Procedure: ESOPHAGOGASTRODUODENOSCOPY (EGD) WITH PROPOFOL;  Surgeon: Billie Ruddy  Gustavo Lah, MD;  Location: [facility];  Service: Endoscopy;  Laterality: N/A;   ESOPHAGOGASTRODUODENOSCOPY (EGD) WITH PROPOFOL N/A 05/11/2018   Procedure: ESOPHAGOGASTRODUODENOSCOPY (EGD) WITH PROPOFOL;  Surgeon: [name], MD;  Location: [facility];  Service: Endoscopy;  Laterality: N/A;   ESOPHAGOGASTRODUODENOSCOPY (EGD) WITH PROPOFOL N/A 09/08/2018   Procedure: ESOPHAGOGASTRODUODENOSCOPY (EGD) WITH BIOPSY;  Surgeon: [name], MD;  Location: [place];  Service: Endoscopy;  Laterality: N/A;   FOOT ARTHRODESIS Left 09/03/2018   Procedure: ARTHRODESIS; HALLUX/IP JOINT LEFT;  Surgeon: Albertine Patricia, DPM;  Location: [place];  Service:  Podiatry;  Laterality: Left;  [place]   GASTRIC BYPASS  2011   JOINT REPLACEMENT Left 09/27/2014   HIP   JOINT REPLACEMENT Left 12/01/2014   KNEE   KNEE ARTHROSCOPY Right    KNEE ARTHROSCOPY Left 03/23/2015   Procedure: ARTHROSCOPY KNEE, partial synovectomy;  Surgeon: Hessie Knows, MD;  Location: [facility];  Service: Orthopedics;  Laterality: Left;   LAPAROSCOPIC GASTRIC BANDING  2010   POLYPECTOMY N/A 09/08/2018   Procedure: POLYPECTOMY;  Surgeon: [name], MD;  Location: [place];  Service: Endoscopy;  Laterality: N/A;  Stomach   ROTATOR CUFF REPAIR Bilateral    right shoulder 07-26-2016   SHOULDER ARTHROSCOPY Left 06/20/2015   Procedure: ARTHROSCOPY SHOULDER, REPAIR OF MASSIVE ROTATOR CUFF TEAR, TENODESIS, DECOMPRESSION DEBRIDEMENT;  Surgeon: Corky Mull, MD;  Location: [facility];  Service: Orthopedics;  Laterality: Left;   SHOULDER ARTHROSCOPY WITH OPEN ROTATOR CUFF REPAIR Right 12/22/2018   Procedure: SHOULDER ARTHROSCOPY WITH OPEN ROTATOR CUFF REPAIR;  Surgeon: Corky Mull, MD;  Location: [facility];  Service: Orthopedics;  Laterality: Right;   TOTAL HIP ARTHROPLASTY Left 09/27/2014   Procedure: TOTAL HIP ARTHROPLASTY ANTERIOR APPROACH;  Surgeon: Hessie Knows, MD;  Location: [facility];  Service: Orthopedics;  Laterality: Left;   TOTAL KNEE ARTHROPLASTY Left 12/01/2014   Procedure: TOTAL KNEE ARTHROPLASTY;  Surgeon: Hessie Knows, MD;  Location: [facility];  Service: Orthopedics;  Laterality: Left;   Family History  Problem Relation Age of Onset   Arthritis Mother    Hyperlipidemia Mother    Hypertension Mother    Diabetes Mother    Sleep apnea Mother    Obesity Mother    Arthritis Father    Hyperlipidemia Father    Hypertension Father    Mental illness Father    Diabetes Father    Obesity Father    Sleep apnea Father    Bipolar disorder Father    Anxiety disorder Father    Depression Father    Cancer Father    Kidney disease Father     Arthritis Maternal Grandmother    Cancer Maternal Grandmother        breast cancer   Hyperlipidemia Maternal Grandmother    Hypertension Maternal Grandmother    Breast cancer Maternal Grandmother    Arthritis Maternal Grandfather    Hyperlipidemia Maternal Grandfather    Hypertension Maternal Grandfather    Heart disease Maternal Grandfather        heart attack   Breast cancer Maternal Grandfather    Breast cancer Paternal Grandmother    Social History   Socioeconomic History   Marital status: Divorced    Spouse name: Not on file   Number of children: Not on file   Years of education: Not on file   Highest education level: Not on file  Occupational History   Occupation: Chiropractor  Tobacco Use  Smoking status: Never   Smokeless tobacco: Never  Vaping Use   Vaping Use: Never used  Substance and Sexual Activity   Alcohol use: Yes    Alcohol/week: 0.0 - 1.0 standard drinks    Comment: occ.   Drug use: No   Sexual activity: Yes    Partners: Male    Birth control/protection: Surgical    Comment: Ablation   Other Topics Concern   Not on file  Social History Narrative   Not on file   Social Determinants of Health   Financial Resource Strain: Not on file  Food Insecurity: Not on file  Transportation Needs: Not on file  Physical Activity: Not on file  Stress: Not on file  Social Connections: Not on file    Review of Systems  Constitutional:  Negative for appetite change and unexpected weight change.  HENT:  Negative for congestion and sinus pressure.   Respiratory:  Negative for cough, chest tightness and shortness of breath.   Cardiovascular:  Negative for chest pain, palpitations and leg swelling.  Gastrointestinal:  Negative for abdominal pain, diarrhea, nausea and vomiting.  Genitourinary:  Negative for difficulty urinating and dysuria.  Musculoskeletal:  Negative for joint swelling and myalgias.  Skin:  Negative for color change and rash.  Neurological:   Negative for dizziness, light-headedness and headaches.  Psychiatric/Behavioral:  Negative for agitation and dysphoric mood.       Objective:    Physical Exam Vitals reviewed.  Constitutional:      General: She is not in acute distress.    Appearance: Normal appearance.  HENT:     Head: Normocephalic and atraumatic.     Right Ear: External ear normal.     Left Ear: External ear normal.  Eyes:     General: No scleral icterus.       Right eye: No discharge.        Left eye: No discharge.     Conjunctiva/sclera: Conjunctivae normal.  Neck:     Thyroid: No thyromegaly.  Cardiovascular:     Rate and Rhythm: Normal rate and regular rhythm.  Pulmonary:     Effort: No respiratory distress.     Breath sounds: Normal breath sounds. No wheezing.  Abdominal:     General: Bowel sounds are normal.     Palpations: Abdomen is soft.     Tenderness: There is no abdominal tenderness.  Musculoskeletal:        General: No swelling or tenderness.     Cervical back: Neck supple. No tenderness.  Lymphadenopathy:     Cervical: No cervical adenopathy.  Skin:    Findings: No erythema or rash.  Neurological:     Mental Status: She is alert.  Psychiatric:        Mood and Affect: Mood normal.        Behavior: Behavior normal.    BP 126/78   Pulse 88   Temp (!) 97.3 F (36.3 C)   Resp 16   Ht '5\' 8"'  (1.727 m)   Wt 246 lb (111.6 kg)   SpO2 98%   BMI 37.40 kg/m  Wt Readings from Last 3 Encounters:  11/14/20 246 lb (111.6 kg)  08/08/20 241 lb 6.4 oz (109.5 kg)  03/28/20 246 lb 2.3 oz (111.7 kg)    Outpatient Encounter Medications as of 11/14/2020  Medication Sig   gabapentin (NEURONTIN) 100 MG capsule Take 4109m in the morning (1 3011mtablet and 1 10041mablet) and 1500m69m night (5 300mg16mlets)  allopurinol (ZYLOPRIM) 300 MG tablet Take 300 mg by mouth daily.   cyanocobalamin (,VITAMIN B-12,) 1000 MCG/ML injection Inject 1 mL into the muscle daily.   EPINEPHrine 0.3 mg/0.3 mL IJ  SOAJ injection Inject 0.3 mLs (0.3 mg total) into the muscle as needed for anaphylaxis.   folic acid (FOLVITE) 1 MG tablet Take 1 mg by mouth daily.   furosemide (LASIX) 40 MG tablet TAKE 1 TABLET(40 MG) BY MOUTH DAILY   levothyroxine (SYNTHROID) 137 MCG tablet SMARTSIG:1 Tablet(s) By Mouth Daily   methotrexate (RHEUMATREX) 2.5 MG tablet Take 15 mg by mouth once a week.   pantoprazole (PROTONIX) 20 MG tablet Take 1 tablet (20 mg total) by mouth daily.   potassium chloride SA (KLOR-CON) 20 MEQ tablet TAKE 2 TABLETS BY MOUTH DAILY IN THE MORNING AND 1 TABLET BY MOUTH IN THE EVENING   sertraline (ZOLOFT) 50 MG tablet TAKE 1 TABLET(50 MG) BY MOUTH DAILY   triamterene-hydrochlorothiazide (MAXZIDE-25) 37.5-25 MG tablet TAKE 1 TABLET BY MOUTH DAILY   [DISCONTINUED] clobetasol ointment (TEMOVATE) 0.45 % Apply 1 application topically at bedtime as needed (inflammation).    [DISCONTINUED] colchicine 0.6 MG tablet Take 0.6 mg by mouth daily.   [DISCONTINUED] famotidine (PEPCID) 20 MG tablet Take 1 tablet (20 mg total) by mouth daily.   [DISCONTINUED] gabapentin (NEURONTIN) 300 MG capsule 300 mg 2 (two) times daily.    Facility-Administered Encounter Medications as of 11/14/2020  Medication   0.9 %  sodium chloride infusion   cyanocobalamin ((VITAMIN B-12)) injection 1,000 mcg     Lab Results  Component Value Date   WBC 6.4 06/29/2020   HGB 15.0 06/29/2020   HCT 45.2 06/29/2020   PLT 317 06/29/2020   GLUCOSE 99 08/21/2020   CHOL 198 08/21/2020   TRIG 202.0 (H) 08/21/2020   HDL 49.90 08/21/2020   LDLDIRECT 138.0 08/21/2020   LDLCALC 148 (H) 05/10/2019   ALT 18 08/21/2020   AST 12 08/21/2020   NA 141 08/21/2020   K 4.1 08/21/2020   CL 101 08/21/2020   CREATININE 0.75 08/21/2020   BUN 15 08/21/2020   CO2 31 08/21/2020   TSH 0.57 05/26/2018   INR 0.99 03/08/2016   HGBA1C 5.7 08/21/2020    MM 3D SCREEN BREAST BILATERAL  Result Date: 10/05/2019 CLINICAL DATA:  Screening. EXAM: DIGITAL  SCREENING BILATERAL MAMMOGRAM WITH TOMO AND CAD COMPARISON:  Previous exam(s). ACR Breast Density Category b: There are scattered areas of fibroglandular density. FINDINGS: There are no findings suspicious for malignancy. Images were processed with CAD. IMPRESSION: No mammographic evidence of malignancy. A result letter of this screening mammogram will be mailed directly to the patient. RECOMMENDATION: Screening mammogram in one year. (Code:SM-B-01Y) BI-RADS CATEGORY  1: Negative. Electronically Signed   By: Audie Pinto M.D.   On: 10/05/2019 13:35       Assessment & Plan:   Problem List Items Addressed This Visit     Anemia    Follow cbc and iron studies.       Relevant Medications   folic acid (FOLVITE) 1 MG tablet   Other Relevant Orders   CBC with Differential/Platelet   IBC + Ferritin   B12 deficiency    Check B12 level.       Essential hypertension, benign    On triam/hctz and takes lasix.  Has tried to decreased and has problems with increased swelling.  Blood pressure doing well.  Follow pressures.  Follow metabolic panel.       Relevant Orders  Basic metabolic panel   GERD (gastroesophageal reflux disease)    On problems reported.  On protonix.       History of colon polyps    Colonoscopy 05/2018 - recommended f/u colonoscopy in 3 years.       History of Roux-en-Y gastric bypass    Check B12 and vitamin d.       Hypercholesterolemia    The 10-year ASCVD risk score Mikey Bussing DC Brooke Bonito., et al., 2013) is: 2.6%   Values used to calculate the score:     Age: 43 years     Sex: Female     Is Non-Hispanic African American: No     Diabetic: No     Tobacco smoker: No     Systolic Blood Pressure: 750 mmHg     Is BP treated: Yes     HDL Cholesterol: 49.9 mg/dL     Total Cholesterol: 198 mg/dL  Low cholesterol diet and exercise.  Follow lipid panel.       Relevant Orders   Lipid panel   Hepatic function panel   Hyperglycemia - Primary    Low carb diet and exercise.   Follow met b and a1c.       Relevant Orders   Hemoglobin A1c   Hypokalemia    Taking potassium supplements.  Follow potasium level.       Hypothyroidism    On thyroid replacement.  On synthroid.  Needs tsh checked.  Forward results to endocrinology.       Relevant Orders   TSH   Iron deficiency anemia following bariatric surgery    Follow cbc and ferritin.        Relevant Medications   folic acid (FOLVITE) 1 MG tablet   Mild depression (HCC)    Overall appears to be handling things well.  Increased stress - living with mother.  Zoloft.  Follow.       Numbness and tingling    Seeing neurology.  On gabapentin.  Just had dose increased.       Primary localized osteoarthritis of left hip    S/p hip surgery.  Seeing ortho.       Relevant Medications   methotrexate (RHEUMATREX) 2.5 MG tablet   Primary osteoarthritis of knee    Recently saw ortho.  S/p injection.  Follow.       Relevant Medications   methotrexate (RHEUMATREX) 2.5 MG tablet   Psoriatic arthritis (HCC)    Continue cosentyx.  Follow.       Relevant Medications   methotrexate (RHEUMATREX) 2.5 MG tablet   Pulmonary nodule    Found incidentally on CT scan (05/22/20). .  CT chest ordered.  - needs f/u chest CT to better evaluate.       Other Visit Diagnoses     Visit for screening mammogram       Relevant Orders   MM 3D SCREEN BREAST BILATERAL        Einar Pheasant, MD

## 2020-11-19 ENCOUNTER — Encounter: Payer: Self-pay | Admitting: Internal Medicine

## 2020-11-19 NOTE — Assessment & Plan Note (Signed)
Low carb diet and exercise.  Follow met b and a1c.  

## 2020-11-19 NOTE — Assessment & Plan Note (Signed)
On triam/hctz and takes lasix.  Has tried to decreased and has problems with increased swelling.  Blood pressure doing well.  Follow pressures.  Follow metabolic panel.

## 2020-11-19 NOTE — Assessment & Plan Note (Addendum)
Found incidentally on CT scan (05/22/20). .  CT chest ordered.  - needs f/u chest CT to better evaluate.

## 2020-11-19 NOTE — Assessment & Plan Note (Signed)
Seeing neurology.  On gabapentin.  Just had dose increased.

## 2020-11-19 NOTE — Assessment & Plan Note (Signed)
Overall appears to be handling things well.  Increased stress - living with mother.  Zoloft.  Follow.

## 2020-11-19 NOTE — Assessment & Plan Note (Signed)
On problems reported.  On protonix.

## 2020-11-19 NOTE — Assessment & Plan Note (Signed)
Check B12 level. 

## 2020-11-19 NOTE — Assessment & Plan Note (Signed)
The 10-year ASCVD risk score Mikey Bussing DC Brooke Bonito., et al., 2013) is: 2.6%   Values used to calculate the score:     Age: 55 years     Sex: Female     Is Non-Hispanic African American: No     Diabetic: No     Tobacco smoker: No     Systolic Blood Pressure: 123XX123 mmHg     Is BP treated: Yes     HDL Cholesterol: 49.9 mg/dL     Total Cholesterol: 198 mg/dL  Low cholesterol diet and exercise.  Follow lipid panel.

## 2020-11-19 NOTE — Assessment & Plan Note (Signed)
S/p hip surgery.  Seeing ortho.

## 2020-11-19 NOTE — Assessment & Plan Note (Signed)
Colonoscopy 05/2018 - recommended f/u colonoscopy in 3 years.

## 2020-11-19 NOTE — Assessment & Plan Note (Signed)
Continue cosentyx.  Follow.

## 2020-11-19 NOTE — Assessment & Plan Note (Signed)
Check B12 and vitamin d.

## 2020-11-19 NOTE — Assessment & Plan Note (Signed)
Taking potassium supplements.  Follow potasium level.

## 2020-11-19 NOTE — Assessment & Plan Note (Signed)
On thyroid replacement.  On synthroid.  Needs tsh checked.  Forward results to endocrinology.

## 2020-11-19 NOTE — Assessment & Plan Note (Signed)
Recently saw ortho.  S/p injection.  Follow.

## 2020-11-19 NOTE — Assessment & Plan Note (Signed)
Follow cbc and iron studies.  

## 2020-11-19 NOTE — Assessment & Plan Note (Signed)
Follow cbc and ferritin.  

## 2020-11-23 ENCOUNTER — Encounter: Payer: Self-pay | Admitting: Hematology and Oncology

## 2020-11-28 ENCOUNTER — Other Ambulatory Visit: Payer: Self-pay

## 2020-11-28 ENCOUNTER — Ambulatory Visit
Admission: RE | Admit: 2020-11-28 | Discharge: 2020-11-28 | Disposition: A | Discharge status: Home / Self Care | Payer: 59 | Source: Ambulatory Visit | Attending: Internal Medicine | Admitting: Internal Medicine

## 2020-11-28 DIAGNOSIS — Z1231 Encounter for screening mammogram for malignant neoplasm of breast: Secondary | ICD-10-CM | POA: Insufficient documentation

## 2020-12-06 ENCOUNTER — Telehealth: Payer: Self-pay | Admitting: Internal Medicine

## 2020-12-06 NOTE — Telephone Encounter (Signed)
Patient insurance calling in stating they faxed over disability paperwork on 11/27/20. Calling to check if this has been received and completed?   Call back number is [phone].

## 2020-12-06 NOTE — Telephone Encounter (Signed)
Called [employer] to let them know that I have not received paperwork. They will resend

## 2020-12-18 ENCOUNTER — Telehealth: Payer: Self-pay

## 2020-12-18 NOTE — Telephone Encounter (Signed)
LM for patient to discuss paperwork that was received 9/15. Need to clarify what paperwork is for.

## 2020-12-22 ENCOUNTER — Other Ambulatory Visit (INDEPENDENT_AMBULATORY_CARE_PROVIDER_SITE_OTHER): Payer: 59

## 2020-12-22 ENCOUNTER — Other Ambulatory Visit: Payer: Self-pay

## 2020-12-22 DIAGNOSIS — D509 Iron deficiency anemia, unspecified: Secondary | ICD-10-CM

## 2020-12-22 DIAGNOSIS — R739 Hyperglycemia, unspecified: Secondary | ICD-10-CM

## 2020-12-22 DIAGNOSIS — E039 Hypothyroidism, unspecified: Secondary | ICD-10-CM | POA: Diagnosis not present

## 2020-12-22 DIAGNOSIS — I10 Essential (primary) hypertension: Secondary | ICD-10-CM

## 2020-12-22 DIAGNOSIS — E7800 Pure hypercholesterolemia, unspecified: Secondary | ICD-10-CM | POA: Diagnosis not present

## 2020-12-22 LAB — CBC WITH DIFFERENTIAL/PLATELET
Basophils Absolute: 0 10*3/uL (ref 0.0–0.1)
Basophils Relative: 0.9 % (ref 0.0–3.0)
Eosinophils Absolute: 0.2 10*3/uL (ref 0.0–0.7)
Eosinophils Relative: 4.5 % (ref 0.0–5.0)
HCT: 41 % (ref 36.0–46.0)
Hemoglobin: 13.4 g/dL (ref 12.0–15.0)
Lymphocytes Relative: 40.8 % (ref 12.0–46.0)
Lymphs Abs: 2.1 10*3/uL (ref 0.7–4.0)
MCHC: 32.7 g/dL (ref 30.0–36.0)
MCV: 94 fl (ref 78.0–100.0)
Monocytes Absolute: 0.4 10*3/uL (ref 0.1–1.0)
Monocytes Relative: 8.1 % (ref 3.0–12.0)
Neutro Abs: 2.4 10*3/uL (ref 1.4–7.7)
Neutrophils Relative %: 45.7 % (ref 43.0–77.0)
Platelets: 263 10*3/uL (ref 150.0–400.0)
RBC: 4.37 Mil/uL (ref 3.87–5.11)
RDW: 14.9 % (ref 11.5–15.5)
WBC: 5.3 10*3/uL (ref 4.0–10.5)

## 2020-12-22 LAB — HEPATIC FUNCTION PANEL
ALT: 13 U/L (ref 0–35)
AST: 10 U/L (ref 0–37)
Albumin: 4.3 g/dL (ref 3.5–5.2)
Alkaline Phosphatase: 71 U/L (ref 39–117)
Bilirubin, Direct: 0.1 mg/dL (ref 0.0–0.3)
Total Bilirubin: 0.5 mg/dL (ref 0.2–1.2)
Total Protein: 6.9 g/dL (ref 6.0–8.3)

## 2020-12-22 LAB — LIPID PANEL
Cholesterol: 167 mg/dL (ref 0–200)
HDL: 43.3 mg/dL (ref >39.00)
NonHDL: 124.19
Total CHOL/HDL Ratio: 4
Triglycerides: 230 mg/dL — ABNORMAL HIGH (ref 0.0–149.0)
VLDL: 46 mg/dL — ABNORMAL HIGH (ref 0.0–40.0)

## 2020-12-22 LAB — BASIC METABOLIC PANEL
BUN: 16 mg/dL (ref 6–23)
CO2: 34 mEq/L — ABNORMAL HIGH (ref 19–32)
Calcium: 9.7 mg/dL (ref 8.4–10.5)
Chloride: 96 mEq/L (ref 96–112)
Creatinine, Ser: 0.79 mg/dL (ref 0.40–1.20)
GFR: 84.5 mL/min (ref >60.00)
Glucose, Bld: 103 mg/dL — ABNORMAL HIGH (ref 70–99)
Potassium: 3.3 mEq/L — ABNORMAL LOW (ref 3.5–5.1)
Sodium: 140 mEq/L (ref 135–145)

## 2020-12-22 LAB — TSH: TSH: 3.29 u[IU]/mL (ref 0.35–5.50)

## 2020-12-22 LAB — IBC + FERRITIN
Ferritin: 19.3 ng/mL (ref 10.0–291.0)
Iron: 53 ug/dL (ref 42–145)
Saturation Ratios: 14.3 % — ABNORMAL LOW (ref 20.0–50.0)
TIBC: 371 ug/dL (ref 250.0–450.0)
Transferrin: 265 mg/dL (ref 212.0–360.0)

## 2020-12-22 LAB — LDL CHOLESTEROL, DIRECT: Direct LDL: 102 mg/dL

## 2020-12-22 LAB — HEMOGLOBIN A1C: Hgb A1c MFr Bld: 5.8 % (ref 4.6–6.5)

## 2020-12-22 NOTE — Telephone Encounter (Signed)
Praveem from [employer] is calling to check on the status of long term disability paperwork they sent over for the patient on 12/14/20.Praveem given office email to send over the paperwork as well.Please call him at [phone] reference number [id number].

## 2020-12-22 NOTE — Telephone Encounter (Signed)
I have forms. Cannot complete until able to clarify with patient what this is for. LM for pt to call back

## 2020-12-25 ENCOUNTER — Encounter: Payer: Self-pay | Admitting: Internal Medicine

## 2020-12-25 DIAGNOSIS — E876 Hypokalemia: Secondary | ICD-10-CM

## 2020-12-26 ENCOUNTER — Other Ambulatory Visit: Payer: Self-pay

## 2020-12-26 DIAGNOSIS — E876 Hypokalemia: Secondary | ICD-10-CM

## 2020-12-26 MED ORDER — POTASSIUM CHLORIDE CRYS ER 20 MEQ PO TBCR: EXTENDED_RELEASE_TABLET | ORAL | 1 refills | Status: DC

## 2020-12-26 NOTE — Telephone Encounter (Signed)
Waiting on response from Dr Nicki Reaper prior to sending medication in. Patient is aware.

## 2020-12-26 NOTE — Telephone Encounter (Signed)
Spoke with patient to discuss forms. Patient is out of work because of her shoulder- work is requesting med records. Advised that since Dr Nicki Reaper is not the MD following this, we cannot complete forms. These need to be completed by the specialist that took her out of work. Patient gave verbal understanding. Will fax forms to Dr Marney Setting at [facility] for them to complete forms for her.

## 2020-12-27 NOTE — Telephone Encounter (Signed)
I would prefer for her to get [place] [facility] [place].

## 2021-01-01 NOTE — Telephone Encounter (Signed)
Calling in and states they still need all records form January 2022 to present from the PCP even though Patient was not taken out by [name].   Please advise

## 2021-01-02 NOTE — Telephone Encounter (Signed)
Med rec request sent to [facility].

## 2021-01-05 ENCOUNTER — Telehealth: Payer: Self-pay | Admitting: Internal Medicine

## 2021-01-05 DIAGNOSIS — R221 Localized swelling, mass and lump, neck: Secondary | ICD-10-CM

## 2021-01-05 NOTE — Telephone Encounter (Signed)
Reviewed CT scan. If she is agreeable, I would like to schedule her for thyroid ultrasound to better evaluate.

## 2021-01-05 NOTE — Telephone Encounter (Signed)
Fax received and placed out for your review when you return on Monday. Pt is out of town the entire month of October.

## 2021-01-05 NOTE — Telephone Encounter (Signed)
Holding for fax. Still have not received

## 2021-01-05 NOTE — Telephone Encounter (Signed)
[facility] states they faxed over a report for Patient's CT. This was faxed over around 11:30 am today. Still waiting for this to come through.   There was an incidental finding of a thyroid nodule with the recommendation of a follow up ultrasound. Patient no longer sees an endocrinologist and they would like for her to follow up with PCP on this.

## 2021-01-09 NOTE — Telephone Encounter (Signed)
Patient agreeable. Needs to be done after 10/31

## 2021-01-09 NOTE — Telephone Encounter (Signed)
Order placed for thyroid ultrasound. 

## 2021-01-09 NOTE — Addendum Note (Signed)
Addended by: Alisa Graff on: 01/09/2021 09:50 PM   Modules accepted: Orders

## 2021-01-09 NOTE — Telephone Encounter (Signed)
LMTCB

## 2021-01-09 NOTE — Telephone Encounter (Signed)
Order placed for thyroid ultrasound.  Needs scheduled after 01/29/21.  I forgot to put this on the order.  Thanks.

## 2021-01-10 NOTE — Telephone Encounter (Signed)
Thank you very much.   Dr Nicki Reaper

## 2021-01-19 NOTE — Telephone Encounter (Signed)
Would prefer her to have drawn prior to 01/31/21, but if she has no way to check, will need to have checked as soon as she can.

## 2021-01-31 ENCOUNTER — Other Ambulatory Visit: Payer: Self-pay

## 2021-01-31 ENCOUNTER — Ambulatory Visit
Admission: RE | Admit: 2021-01-31 | Discharge: 2021-01-31 | Disposition: A | Discharge status: Home / Self Care | Payer: 59 | Source: Ambulatory Visit | Attending: Internal Medicine | Admitting: Internal Medicine

## 2021-01-31 DIAGNOSIS — R221 Localized swelling, mass and lump, neck: Secondary | ICD-10-CM | POA: Diagnosis not present

## 2021-02-02 ENCOUNTER — Encounter: Payer: Self-pay | Admitting: Hematology and Oncology

## 2021-02-02 ENCOUNTER — Other Ambulatory Visit: Payer: Self-pay | Admitting: Internal Medicine

## 2021-02-02 DIAGNOSIS — E041 Nontoxic single thyroid nodule: Secondary | ICD-10-CM

## 2021-02-02 DIAGNOSIS — E042 Nontoxic multinodular goiter: Secondary | ICD-10-CM

## 2021-02-02 NOTE — Progress Notes (Signed)
Order placed for endocrinology referral.  

## 2021-02-20 ENCOUNTER — Encounter: Payer: 59 | Admitting: Internal Medicine

## 2021-03-01 ENCOUNTER — Other Ambulatory Visit: Payer: Self-pay | Admitting: Internal Medicine

## 2021-03-29 ENCOUNTER — Encounter: Payer: Self-pay | Admitting: Hematology and Oncology

## 2021-04-06 ENCOUNTER — Other Ambulatory Visit: Payer: Self-pay | Admitting: Internal Medicine

## 2021-04-16 ENCOUNTER — Telehealth: Payer: Self-pay | Admitting: Internal Medicine

## 2021-04-16 DIAGNOSIS — E042 Nontoxic multinodular goiter: Secondary | ICD-10-CM

## 2021-04-16 NOTE — Telephone Encounter (Signed)
"  Rejection Reason - Not taking new patients - Unable to contact patient to schedule appointment." Doyle Askew said on Apr 12, 2021 9:05 AM   Msg from [place] referral needed to a different facility. Please advise and Thank you!

## 2021-04-16 NOTE — Addendum Note (Signed)
Addended by: Adair Laundry on: 04/16/2021 01:20 PM   Modules accepted: Orders

## 2021-04-16 NOTE — Telephone Encounter (Signed)
See if she is ok to go to [place].  If so, can be referred to [facility] - ultrasound with thyroid nodules.

## 2021-04-16 NOTE — Telephone Encounter (Signed)
Pt is okay with going to [place]. Referral has been placed.

## 2021-04-24 ENCOUNTER — Telehealth: Payer: Self-pay

## 2021-04-24 NOTE — Telephone Encounter (Signed)
New pt appt scheduled.

## 2021-04-29 ENCOUNTER — Encounter: Payer: Self-pay | Admitting: Hematology and Oncology

## 2021-04-30 ENCOUNTER — Encounter: Payer: Self-pay | Admitting: Internal Medicine

## 2021-04-30 ENCOUNTER — Other Ambulatory Visit: Payer: Self-pay

## 2021-04-30 ENCOUNTER — Ambulatory Visit (INDEPENDENT_AMBULATORY_CARE_PROVIDER_SITE_OTHER): Payer: 59 | Admitting: Internal Medicine

## 2021-04-30 VITALS — BP 130/78 | HR 64 | Ht 68.0 in | Wt 235.2 lb

## 2021-04-30 DIAGNOSIS — E042 Nontoxic multinodular goiter: Secondary | ICD-10-CM

## 2021-04-30 MED ORDER — LEVOTHYROXINE SODIUM 137 MCG PO TABS: 137.0000 ug | ORAL_TABLET | Freq: Every day | ORAL | 3 refills | Status: DC

## 2021-04-30 NOTE — Progress Notes (Addendum)
Patient ID: Lori Le, female   DOB: 01/02/1966, 56 y.o.   MRN: [id number]  This visit occurred during the SARS-CoV-2 public health emergency.  Safety protocols were in place, including screening questions prior to the visit, additional usage of staff PPE, and extensive cleaning of exam room while observing appropriate contact time as indicated for disinfecting solutions.   HPI  Lori Le is a 56 y.o.-year-old very pleasant female, referred by her PCP, Dr.Jelinek, for evaluation  and management of thyroid nodules.  Patient was recently found to have thyroid nodules as an incidental finding on an CT for shoulder pain (after shoulder replacement).  She was subsequently seen by PCP who ordered a thyroid ultrasound.  This showed 6 thyroid nodules, of which 1 required biopsy.  She was referred to endocrinology.    Thyroid U/S (01/31/2021):  Parenchymal Echotexture: Moderately heterogeneous  Isthmus: 0.4 cm Right lobe: 4.5 x 1.2 x 1.6 cm  Left lobe: 4.4 x 1.5 x 2.2 cm  _________________________________________________________   Estimated total number of nodules >/= 1 cm: 6 _________________________________________________________   Nodule # 1:  Location: Right; superior  Maximum size: 1.0 cm; Other 2 dimensions: 0.9 x 0.8 cm  Composition: solid/almost completely solid (2)  Echogenicity: hypoechoic (2)  *Given size (>/= 1 - 1.4 cm) and appearance, a follow-up ultrasound in 1 year should be considered based on TI-RADS criteria.  _________________________________________________________   Nodule # 2:  Location: Right; mid  Maximum size: 1.0 cm; Other 2 dimensions: 0.9 x 0.8 cm  Composition: cannot determine (2) Echogenicity: cannot determine (1) Echogenic foci: peripheral calcifications (2) *Given size (>/= 1 - 1.4 cm) and appearance, a follow-up ultrasound in 1 year should be considered based on TI-RADS criteria.    _________________________________________________________   Nodule  # 3: 1.6 x 1.6 x 1.5 cm ill-defined hypoechoic region in the right mid thyroid lobe favored to be a pseudo nodule given lack of defined margins.  _________________________________________________________   Nodule # 4:  Location: Right; inferior  Maximum size: 1.2 cm; Other 2 dimensions: 0.9 x 0.8 cm  Composition: solid/almost completely solid (2)  Echogenicity: isoechoic (1) Given size (<1.4 cm) and appearance, this nodule does NOT meet TI-RADS criteria for biopsy or dedicated follow-up.  _________________________________________________________   Nodule # 5:  Location: Left; mid  Maximum size: 3.4 cm; Other 2 dimensions: 2.2 x 1.2 cm  Composition: solid/almost completely solid (2)  Echogenicity: isoechoic (1)  **Given size (>/= 2.5 cm) and appearance, fine needle aspiration of this mildly suspicious nodule should be considered based on TI-RADS criteria.      _________________________________________________________   Nodule 6: 1.6 x 1.5 x 1.0 cm cystic nodule in the inferior left thyroid lobe does not meet criteria for imaging surveillance or FNA.   IMPRESSION: 1. Nodule 5 (TI-RADS 3) located in the mid left thyroid lobe meets criteria for FNA. 2. Nodules 1 and 2 located in the right thyroid lobe meet criteria for imaging follow-up. Annual ultrasound surveillance is recommended until 5 years of stability is documented.  Pt denies: - feeling nodules in neck - hoarseness - dysphagia - choking - SOB with lying down  Pt. also has a history of Hashimoto's hypothyroidism:  She takes levothyroxine 137 mcg daily, taken: - in am - fasting - at least 30 min from b'fast - + just started calcium  - in am along with LT4 - no iron - no multivitamins - + PPIs (Pantoprazole) - along with LT4 - + on Biotin  I  reviewed pt's thyroid tests: Lab Results  Component Value Date   TSH 3.29 12/22/2020   TSH 0.57 05/26/2018   TSH 0.54 12/26/2016   TSH 1.40 02/28/2015   TSH 1.23  03/10/2014   TSH 0.80 12/20/2013   TSH 1.14 12/29/2012   FREET4 1.12 06/17/2018    Pt. Mentions: - fatigue - both heat intolerance/cold intolerance - both weight loss/weight gain - constipation - hair loss - poor sleep - anxiety/depression  She has FH of thyroid ds.: sister with goiter.  No FH of thyroid cancer. No h/o radiation tx to head or neck. No steroid use. No herbal supplements. She was on Biotin supplements  - stopped few weeks ago (Hair, Skin [employer] N[employer] vitamins).  Pt also has a history of iron deficiency anemia, HTN, GERD. She has significant widespread arthritis - prev. on MTX.  ROS: Constitutional: + See HPI  Eyes: no blurry vision, no xerophthalmia ENT: no sore throat,  + see HPI Cardiovascular: no CP/SOB/palpitations/+ leg swelling Respiratory: no cough/SOB Gastrointestinal: no N/V/D/+ C Musculoskeletal: + muscle/+ joint aches Skin: + rash, + easy bruising, + hair loss, + itching Neurological: no tremors/numbness/tingling/dizziness Psychiatric: + depression/+ anxiety  Past Medical History:  Diagnosis Date   Allergic rhinitis due to allergen    Allergy    Anemia    Anxiety    Arthritis    B12 deficiency    Cancer (HCC)    skin ca-melanoma   Chronic idiopathic urticaria    Complication of anesthesia    nausea, slow to wake up   Depression    Edema    Family history of adverse reaction to anesthesia    Father - PONV   Gastric ulcer    GERD (gastroesophageal reflux disease)    Gout    Gout    Hashimoto's thyroiditis    Heart murmur    History of eating disorder    History of kidney stones    h/o years ago   Hives    Hyperlipidemia    Hypertension    Hypothyroidism    Lower extremity edema    Melanoma in situ ([place])    left shoulder   Migraine    approx 4x/yr-migraines   Mitral valve disorder    Motion sickness    all moving vehicles   Neuropathy    fingers and feet   PCOS (polycystic ovarian syndrome)    PONV (postoperative nausea and  vomiting)    Psoriatic arthritis (HCC)    Thyroid disease    Ulcer    Vertigo    last episode over 1 yr ago   Past Surgical History:  Procedure Laterality Date   BONE EXCISION Left 09/03/2018   Procedure: PART EXCISION BONE-PHALANX 2,3,4 LEFT;  Surgeon: Albertine Patricia, DPM;  Location: [place];  Service: Podiatry;  Laterality: Left;   BREAST BIOPSY Right 2007   neg- bx/clip   CHOLECYSTECTOMY  2003   COLONOSCOPY WITH PROPOFOL N/A 05/11/2018   Procedure: COLONOSCOPY WITH PROPOFOL;  Surgeon: [name], MD;  Location: [facility];  Service: Endoscopy;  Laterality: N/A;   DILITATION & CURRETTAGE/HYSTROSCOPY WITH NOVASURE ABLATION N/A 08/19/2016   Procedure: DILATATION & CURETTAGE/HYSTEROSCOPY WITH NOVASURE ABLATION;  Surgeon: [name], MD;  Location: [facility];  Service: Gynecology;  Laterality: N/A;   ESOPHAGOGASTRODUODENOSCOPY (EGD) WITH PROPOFOL N/A 12/05/2015   Procedure: ESOPHAGOGASTRODUODENOSCOPY (EGD) WITH PROPOFOL;  Surgeon: Lollie Sails, MD;  Location: [facility];  Service: Endoscopy;  Laterality: N/A;   ESOPHAGOGASTRODUODENOSCOPY (EGD) WITH PROPOFOL  N/A 02/06/2016   Procedure: ESOPHAGOGASTRODUODENOSCOPY (EGD) WITH PROPOFOL;  Surgeon: Lollie Sails, MD;  Location: [facility];  Service: Endoscopy;  Laterality: N/A;   ESOPHAGOGASTRODUODENOSCOPY (EGD) WITH PROPOFOL N/A 05/11/2018   Procedure: ESOPHAGOGASTRODUODENOSCOPY (EGD) WITH PROPOFOL;  Surgeon: [name], MD;  Location: [facility];  Service: Endoscopy;  Laterality: N/A;   ESOPHAGOGASTRODUODENOSCOPY (EGD) WITH PROPOFOL N/A 09/08/2018   Procedure: ESOPHAGOGASTRODUODENOSCOPY (EGD) WITH BIOPSY;  Surgeon: [name], MD;  Location: [place];  Service: Endoscopy;  Laterality: N/A;   FOOT ARTHRODESIS Left 09/03/2018   Procedure: ARTHRODESIS; HALLUX/IP JOINT LEFT;  Surgeon: Albertine Patricia, DPM;  Location: [place];  Service: Podiatry;   Laterality: Left;  [place]   GASTRIC BYPASS  2011   JOINT REPLACEMENT Left 09/27/2014   HIP   JOINT REPLACEMENT Left 12/01/2014   KNEE   KNEE ARTHROSCOPY Right    KNEE ARTHROSCOPY Left 03/23/2015   Procedure: ARTHROSCOPY KNEE, partial synovectomy;  Surgeon: Hessie Knows, MD;  Location: [facility];  Service: Orthopedics;  Laterality: Left;   LAPAROSCOPIC GASTRIC BANDING  2010   POLYPECTOMY N/A 09/08/2018   Procedure: POLYPECTOMY;  Surgeon: [name], MD;  Location: [place];  Service: Endoscopy;  Laterality: N/A;  Stomach   ROTATOR CUFF REPAIR Bilateral    right shoulder 07-26-2016   SHOULDER ARTHROSCOPY Left 06/20/2015   Procedure: ARTHROSCOPY SHOULDER, REPAIR OF MASSIVE ROTATOR CUFF TEAR, TENODESIS, DECOMPRESSION DEBRIDEMENT;  Surgeon: Corky Mull, MD;  Location: [facility];  Service: Orthopedics;  Laterality: Left;   SHOULDER ARTHROSCOPY WITH OPEN ROTATOR CUFF REPAIR Right 12/22/2018   Procedure: SHOULDER ARTHROSCOPY WITH OPEN ROTATOR CUFF REPAIR;  Surgeon: Corky Mull, MD;  Location: [facility];  Service: Orthopedics;  Laterality: Right;   TOTAL HIP ARTHROPLASTY Left 09/27/2014   Procedure: TOTAL HIP ARTHROPLASTY ANTERIOR APPROACH;  Surgeon: Hessie Knows, MD;  Location: [facility];  Service: Orthopedics;  Laterality: Left;   TOTAL KNEE ARTHROPLASTY Left 12/01/2014   Procedure: TOTAL KNEE ARTHROPLASTY;  Surgeon: Hessie Knows, MD;  Location: [facility];  Service: Orthopedics;  Laterality: Left;   Social History   Socioeconomic History   Marital status: Divorced    Spouse name: Not on file   Number of children: 0   Years of education: Not on file   Highest education level: Not on file  Occupational History   Occupation: Chiropractor  Tobacco Use   Smoking status: Never   Smokeless tobacco: Never  Vaping Use   Vaping Use: Never used  Substance and Sexual Activity   Alcohol use: Yes    Alcohol/week: 0.0 - 2.0 standard drinks    Comment:  occ.   Drug use: No   Sexual activity: Not Currently    Partners: Male    Birth control/protection: Surgical    Comment: Ablation   Other Topics Concern   Not on file  Social History Narrative   Not on file   Social Determinants of Health   Financial Resource Strain: Not on file  Food Insecurity: Not on file  Transportation Needs: Not on file  Physical Activity: Not on file  Stress: Not on file  Social Connections: Not on file  Intimate Partner Violence: Not on file   Current Outpatient Medications on File Prior to Visit  Medication Sig Dispense Refill   allopurinol (ZYLOPRIM) 300 MG tablet Take 300 mg by mouth daily.     EPINEPHrine 0.3 mg/0.3 mL IJ SOAJ injection Inject 0.3 mLs (0.3  mg total) into the muscle as needed for anaphylaxis. 1 each 0   folic acid (FOLVITE) 1 MG tablet Take 1 mg by mouth daily.     furosemide (LASIX) 40 MG tablet TAKE 1 TABLET(40 MG) BY MOUTH DAILY 90 tablet 1   gabapentin (NEURONTIN) 100 MG capsule Take 400mg  in the morning (1 300mg  tablet and 1 100mg  tablet) and 1500mg  at night (5 300mg  tablets)     methotrexate (RHEUMATREX) 2.5 MG tablet Take 15 mg by mouth once a week.     pantoprazole (PROTONIX) 20 MG tablet Take 1 tablet (20 mg total) by mouth daily. 90 tablet 0   potassium chloride SA (KLOR-CON M) 20 MEQ tablet TAKE 2 TABLETS BY MOUTH EVERY MORNING AND 1 TABLET EVERY EVENING 270 tablet 1   sertraline (ZOLOFT) 50 MG tablet TAKE 1 TABLET(50 MG) BY MOUTH DAILY 90 tablet 1   triamterene-hydrochlorothiazide (MAXZIDE-25) 37.5-25 MG tablet TAKE 1 TABLET BY MOUTH DAILY 90 tablet 1   Current Facility-Administered Medications on File Prior to Visit  Medication Dose Route Frequency Provider Last Rate Last Admin   0.9 %  sodium chloride infusion   Intravenous Continuous Mike Gip, Melissa C, MD 0 mL/hr at 05/08/17 1437 New Bag at 05/11/18 1437   cyanocobalamin ((VITAMIN B-12)) injection 1,000 mcg  1,000 mcg Intramuscular Once Lequita Asal, MD        Allergies  Allergen Reactions   Contrast Media [Iodinated Contrast Media] Anaphylaxis    MRI dye   Etodolac Anaphylaxis   Alpha-Gal     Flu- like symptoms   Influenza Vaccines Hives   Lactose Nausea And Vomiting    Can have yogurt Can not have milk and ice cream    Lactose Intolerance (Gi) Nausea And Vomiting    Can have yogurt Can not have milk and ice cream     Bacitracin-Neomycin-Polymyxin Rash   Neomycin-Bacitracin Zn-Polymyx Rash   Neosporin + Pain Relief Max St [Neomy-Bacit-Polymyx-Pramoxine] Rash   Family History  Problem Relation Age of Onset   Arthritis Mother    Hyperlipidemia Mother    Hypertension Mother    Diabetes Mother    Sleep apnea Mother    Obesity Mother    Arthritis Father    Hyperlipidemia Father    Hypertension Father    Mental illness Father    Diabetes Father    Obesity Father    Sleep apnea Father    Bipolar disorder Father    Anxiety disorder Father    Depression Father    Cancer Father    Kidney disease Father    Arthritis Maternal Grandmother    Cancer Maternal Grandmother        breast cancer   Hyperlipidemia Maternal Grandmother    Hypertension Maternal Grandmother    Breast cancer Maternal Grandmother    Arthritis Maternal Grandfather    Hyperlipidemia Maternal Grandfather    Hypertension Maternal Grandfather    Heart disease Maternal Grandfather        heart attack   Breast cancer Maternal Grandfather    Breast cancer Paternal Grandmother    PE: BP 130/78 (BP Location: Right Arm, Patient Position: Sitting, Cuff Size: Normal)    Pulse 64    Ht 5\' 8"  (1.727 m)    Wt 235 lb 3.2 oz (106.7 kg)    SpO2 98%    BMI 35.76 kg/m  Wt Readings from Last 3 Encounters:  04/30/21 235 lb 3.2 oz (106.7 kg)  11/14/20 246 lb (111.6 kg)  08/08/20 241  lb 6.4 oz (109.5 kg)   Constitutional: overweight, in NAD Eyes: PERRLA, EOMI, no exophthalmos ENT: moist mucous membranes, no thyromegaly but fulness at palpation of Llower neck, no cervical  lymphadenopathy Cardiovascular: RRR, No MRG  Respiratory: CTA B Gastrointestinal: abdomen soft, NT, ND, BS+ Musculoskeletal: no deformities, strength intact in all 4; tapering of distal phalanges Skin: moist, warm, no rashes Neurological: no tremor with outstretched hands, DTR normal in all 4  ASSESSMENT: 1. Thyroid nodules  2. Hashimoto's hypothyroidism  PLAN: 1. Thyroid nodules - I reviewed the images of her thyroid ultrasound: She has 4 right thyroid nodules between 1-1.6 cm.  One of the nodules appears to be a pseudo nodule (inflammatory), which is a benign feature.  Another nodule appears to have peripheral calcification.  In the right lobe, there are 2 nodules, one of 1.6 cm, which does not require follow-up, but one quite large, at 3.4 cm in the mid left lobe, which requires biopsy due to its size.  -We also discussed about the other features that would increase risk of cancer and thyroid nodules: - hypoechoic - microcalcifications - internal blood flow - taller than wide - invading surrounding tissue/irregular margins Pt does not have a thyroid cancer family history or a personal history of RxTx to head/neck. All these would favor benignity.  - we discussed about proceeding with a thyroid biopsy (FNA) of the dominant nodule. I explained what the test entails.  Patient agrees to have this done. - I explained that this is not cancer, we can continue to follow her on a yearly basis, and check another ultrasound in another year or 2. - if she develops neck compression symptoms of if the L nodule Bx is not benign, we might need to do either lobectomy or thyroidectomy - I'll see her back in 01/2022, assuming her FNA is normal. If FNA abnormal, we will meet sooner.  - we will not repeat her TSH today, since review of her previous TFTs were normal - we will check another thyroid ultrasound at next visit  2. Hashimoto's hypothyroidism - latest thyroid labs reviewed with pt. >>  normal: Lab Results  Component Value Date   TSH 3.29 12/22/2020  - she continues on LT4 137 mcg daily - pt feels good on this dose with the exception of fatigue - we discussed about taking the thyroid hormone every day, with water, >30 minutes before breakfast, separated by >4 hours from acid reflux medications, calcium, iron, multivitamins. Pt. is taking it incorrectly: along with coffee + cream + calcium + PPI >> advised to move coffee 30 min later and calcium + PPI >4h later. - will check thyroid tests in 5 weeks:  TSH and fT4  FNA (05/22/2021): Clinical History: Left; Mid 3.4cm; Other 2 dimensions: 2.2 x 1.2cm,  Solid / almost completely solid, Isoechoic, TI-RADS total points 3  Specimen Submitted:  A. THYROID, LT MID, FINE NEEDLE ASPIRATION:   FINAL MICROSCOPIC DIAGNOSIS:  - Consistent with benign follicular nodule (Bethesda category II)  - See comment   SPECIMEN ADEQUACY:  Satisfactory for evaluation   Philemon Kingdom, MD PhD [facility] Endocrinology

## 2021-04-30 NOTE — Patient Instructions (Addendum)
We will need a thyroid biopsy in [facility] downstairs.  They will be in touch with you to schedule this.  Please continue Levothyroxine 137 mcg daily.  Take the thyroid hormone every day, with water, at least 30 minutes before breakfast, separated by at least 4 hours from: - acid reflux medications - calcium - iron - multivitamins  Move coffee 30 min after Levothyroxine. Move Calcium and Protonix at least 4h later.  Please return in 01/2022 for a follow up visit.  Thyroid Nodule A thyroid nodule is an isolated growth of thyroid cells that forms a lump in your thyroid gland. The thyroid gland is a butterfly-shaped gland. It is found in the lower front of your neck. This gland sends chemical messengers (hormones) through your blood to all parts of your body. These hormones are important in regulating your body temperature and helping your body to use energy. Thyroid nodules are common. Most are not cancerous (benign). You may have one nodule or several nodules. Different types of thyroid nodules include nodules that: Grow and fill with fluid (thyroid cysts). Produce too much thyroid hormone (hot nodules or hyperthyroid). Produce no thyroid hormone (cold nodules or hypothyroid). Form from cancer cells (thyroid cancers). What are the causes? In most cases, the cause of this condition is not known. What increases the risk? The following factors may make you more likely to develop this condition. Age. Thyroid nodules become more common in people who are older than 56 years of age. Gender. Benign thyroid nodules are more common in women. Cancerous (malignant) thyroid nodules are more common in men. A family history that includes: Thyroid nodules. Pheochromocytoma. Thyroid carcinoma. Hyperparathyroidism. Certain kinds of thyroid diseases, such as Hashimoto's thyroiditis. Lack of iodine in your diet. A history of head and neck radiation, such as from previous cancer  treatment. What are the signs or symptoms? In many cases, there are no symptoms. If you have symptoms, they may include: A lump in your lower neck. Feeling a lump or tickle in your throat. Pain in your neck, jaw, or ear. Having trouble swallowing. Hot nodules may cause symptoms that include: Weight loss. Warm, flushed skin. Feeling hot. Feeling nervous. A racing heartbeat. Cold nodules may cause symptoms that include: Weight gain. Dry skin. Brittle hair. This may also occur with hair loss. Feeling cold. Fatigue. Thyroid cancer nodules may cause symptoms that include: Hard nodules that feel stuck to the thyroid gland. Hoarseness. Lumps in the glands near your thyroid (lymph nodes). How is this diagnosed? A thyroid nodule may be felt by your health care provider during a physical exam. This condition may also be diagnosed based on your symptoms. You may also have tests, including: An ultrasound. This may be done to confirm the diagnosis. A biopsy. This involves taking a sample from the nodule and looking at it under a microscope. Blood tests to make sure that your thyroid is working properly. A thyroid scan. This test uses a radioactive tracer injected into a vein to create an image of the thyroid gland on a computer screen. Imaging tests such as MRI or CT scan. These may be done if: Your nodule is large. Your nodule is blocking your airway. Cancer is suspected. How is this treated? Treatment depends on the cause and size of your nodule or nodules. If the nodule is benign, treatment may not be necessary. Your health care provider may monitor the nodule to see if it goes away without treatment. If the nodule continues to grow, is  cancerous, or does not go away, treatment may be needed. Treatment may include: Having a cystic nodule drained with a needle. Ablation therapy. In this treatment, alcohol is injected into the area of the nodule to destroy the cells. Ablation with heat  (thermal ablation) may also be used. Radioactive iodine. In this treatment, radioactive iodine is given as a pill or liquid that you drink. This substance causes the thyroid nodule to shrink. Surgery to remove the nodule. Part or all of your thyroid gland may need to be removed as well. Medicines. Follow these instructions at home: Pay attention to any changes in your nodule. Take over-the-counter and prescription medicines only as told by your health care provider. Keep all follow-up visits as told by your health care provider. This is important. Contact a health care provider if: Your voice changes. You have trouble swallowing. You have pain in your neck, ear, or jaw that is getting worse. Your nodule gets bigger. Your nodule starts to make it harder for you to breathe. Your muscles look like they are shrinking (muscle wasting). Get help right away if: You have chest pain. There is a loss of consciousness. You have a sudden fever. You feel confused. You are seeing or hearing things that other people do not see or hear (having hallucinations). You feel very weak. You have mood swings. You feel very restless. You feel suddenly nauseous or throw up. You suddenly have diarrhea. Summary A thyroid nodule is an isolated growth of thyroid cells that forms a lump in your thyroid gland. Thyroid nodules are common. Most are not cancerous (benign). You may have one nodule or several nodules. Treatment depends on the cause and size of your nodule or nodules. If the nodule is benign, treatment may not be necessary. Your health care provider may monitor the nodule to see if it goes away without treatment. If the nodule continues to grow, is cancerous, or does not go away, treatment may be needed. This information is not intended to replace advice given to you by your health care provider. Make sure you discuss any questions you have with your health care provider. Document Revised: 10/31/2017  Document Reviewed: 11/03/2017 Elsevier Patient Education  Lori Le.

## 2021-05-01 ENCOUNTER — Ambulatory Visit: Payer: 59 | Admitting: Gastroenterology

## 2021-05-11 ENCOUNTER — Other Ambulatory Visit: Payer: Self-pay

## 2021-05-11 ENCOUNTER — Ambulatory Visit (INDEPENDENT_AMBULATORY_CARE_PROVIDER_SITE_OTHER): Payer: 59 | Admitting: Internal Medicine

## 2021-05-11 ENCOUNTER — Other Ambulatory Visit (HOSPITAL_COMMUNITY)
Admission: RE | Admit: 2021-05-11 | Discharge: 2021-05-11 | Disposition: A | Discharge status: Home / Self Care | Payer: 59 | Source: Ambulatory Visit | Attending: Internal Medicine | Admitting: Internal Medicine

## 2021-05-11 VITALS — BP 130/72 | HR 76 | Temp 97.9°F | Resp 16 | Ht 68.0 in | Wt 234.0 lb

## 2021-05-11 DIAGNOSIS — I10 Essential (primary) hypertension: Secondary | ICD-10-CM | POA: Diagnosis not present

## 2021-05-11 DIAGNOSIS — L4050 Arthropathic psoriasis, unspecified: Secondary | ICD-10-CM

## 2021-05-11 DIAGNOSIS — K9589 Other complications of other bariatric procedure: Secondary | ICD-10-CM

## 2021-05-11 DIAGNOSIS — Z124 Encounter for screening for malignant neoplasm of cervix: Secondary | ICD-10-CM

## 2021-05-11 DIAGNOSIS — R911 Solitary pulmonary nodule: Secondary | ICD-10-CM

## 2021-05-11 DIAGNOSIS — E7800 Pure hypercholesterolemia, unspecified: Secondary | ICD-10-CM

## 2021-05-11 DIAGNOSIS — R739 Hyperglycemia, unspecified: Secondary | ICD-10-CM

## 2021-05-11 DIAGNOSIS — K219 Gastro-esophageal reflux disease without esophagitis: Secondary | ICD-10-CM

## 2021-05-11 DIAGNOSIS — Z86010 Personal history of colonic polyps: Secondary | ICD-10-CM

## 2021-05-11 DIAGNOSIS — Z0000 Encounter for general adult medical examination without abnormal findings: Secondary | ICD-10-CM | POA: Diagnosis not present

## 2021-05-11 DIAGNOSIS — Z1151 Encounter for screening for human papillomavirus (HPV): Secondary | ICD-10-CM | POA: Insufficient documentation

## 2021-05-11 DIAGNOSIS — Z01419 Encounter for gynecological examination (general) (routine) without abnormal findings: Secondary | ICD-10-CM | POA: Diagnosis present

## 2021-05-11 DIAGNOSIS — F320 Major depressive disorder, single episode, mild: Secondary | ICD-10-CM

## 2021-05-11 DIAGNOSIS — E039 Hypothyroidism, unspecified: Secondary | ICD-10-CM

## 2021-05-11 DIAGNOSIS — R202 Paresthesia of skin: Secondary | ICD-10-CM

## 2021-05-11 DIAGNOSIS — D508 Other iron deficiency anemias: Secondary | ICD-10-CM

## 2021-05-11 DIAGNOSIS — R200 Anesthesia of skin: Secondary | ICD-10-CM

## 2021-05-11 MED ORDER — PANTOPRAZOLE SODIUM 40 MG PO TBEC: 40.0000 mg | DELAYED_RELEASE_TABLET | Freq: Every day | ORAL | 1 refills | Status: DC

## 2021-05-11 NOTE — Progress Notes (Signed)
Patient ID: Lori Le, female   DOB: 1965/09/16, 56 y.o.   MRN: [id number]   Subjective:    Patient ID: Lori Le, female    DOB: 1966-01-21, 56 y.o.   MRN: [id number]  This visit occurred during the SARS-CoV-2 public health emergency.  Safety protocols were in place, including screening questions prior to the visit, additional usage of staff PPE, and extensive cleaning of exam room while observing appropriate contact time as indicated for disinfecting solutions.   Patient here for her physical exam.   Chief Complaint  Patient presents with   Annual Exam   .   HPI Reports decreased stress.  Has a new job.  Living in her own place now.  Doing well.  Feels better.  Has lost weight.  Working in [employer].  Prefers to have her specialist in [place].  Trying to stay active.  No chest pain or sob reported.  Has been out of protonix.  Has noticed increased acid reflux since being off protonix.  No abdominal pain.  Bowels stable.  Due colonoscopy.  Last 05/2018.  Recommended f/u in 3 years.     Past Medical History:  Diagnosis Date   Allergic rhinitis due to allergen    Allergy    Anemia    Anxiety    Arthritis    B12 deficiency    Cancer (HCC)    skin ca-melanoma   Chronic idiopathic urticaria    Complication of anesthesia    nausea, slow to wake up   Depression    Edema    Family history of adverse reaction to anesthesia    Father - PONV   Gastric ulcer    GERD (gastroesophageal reflux disease)    Gout    Gout    Hashimoto's thyroiditis    Heart murmur    History of eating disorder    History of kidney stones    h/o years ago   Hives    Hyperlipidemia    Hypertension    Hypothyroidism    Lower extremity edema    Melanoma in situ ([place])    left shoulder   Migraine    approx 4x/yr-migraines   Mitral valve disorder    Motion sickness    all moving vehicles   Neuropathy    fingers and feet   PCOS (polycystic ovarian syndrome)    PONV (postoperative nausea and vomiting)     Psoriatic arthritis (HCC)    Thyroid disease    Ulcer    Vertigo    last episode over 1 yr ago   Past Surgical History:  Procedure Laterality Date   BONE EXCISION Left 09/03/2018   Procedure: PART EXCISION BONE-PHALANX 2,3,4 LEFT;  Surgeon: Albertine Patricia, DPM;  Location: [facility];  Service: Podiatry;  Laterality: Left;   BREAST BIOPSY Right 2007   neg- bx/clip   CHOLECYSTECTOMY  2003   COLONOSCOPY WITH PROPOFOL N/A 05/11/2018   Procedure: COLONOSCOPY WITH PROPOFOL;  Surgeon: [name], MD;  Location: [facility];  Service: Endoscopy;  Laterality: N/A;   DILITATION & CURRETTAGE/HYSTROSCOPY WITH NOVASURE ABLATION N/A 08/19/2016   Procedure: DILATATION & CURETTAGE/HYSTEROSCOPY WITH NOVASURE ABLATION;  Surgeon: [name], MD;  Location: [facility];  Service: Gynecology;  Laterality: N/A;   ESOPHAGOGASTRODUODENOSCOPY (EGD) WITH PROPOFOL N/A 12/05/2015   Procedure: ESOPHAGOGASTRODUODENOSCOPY (EGD) WITH PROPOFOL;  Surgeon: Lollie Sails, MD;  Location: [facility];  Service: Endoscopy;  Laterality: N/A;   ESOPHAGOGASTRODUODENOSCOPY (EGD) WITH PROPOFOL N/A 02/06/2016   Procedure:  ESOPHAGOGASTRODUODENOSCOPY (EGD) WITH PROPOFOL;  Surgeon: Lollie Sails, MD;  Location: [facility] ENDOSCOPY;  Service: Endoscopy;  Laterality: N/A;   ESOPHAGOGASTRODUODENOSCOPY (EGD) WITH PROPOFOL N/A 05/11/2018   Procedure: ESOPHAGOGASTRODUODENOSCOPY (EGD) WITH PROPOFOL;  Surgeon: [name], MD;  Location: [facility];  Service: Endoscopy;  Laterality: N/A;   ESOPHAGOGASTRODUODENOSCOPY (EGD) WITH PROPOFOL N/A 09/08/2018   Procedure: ESOPHAGOGASTRODUODENOSCOPY (EGD) WITH BIOPSY;  Surgeon: [name], MD;  Location: [facility];  Service: Endoscopy;  Laterality: N/A;   FOOT ARTHRODESIS Left 09/03/2018   Procedure: ARTHRODESIS; HALLUX/IP JOINT LEFT;  Surgeon: Albertine Patricia, DPM;  Location: [facility];  Service: Podiatry;  Laterality: Left;   [place]   GASTRIC BYPASS  2011   JOINT REPLACEMENT Left 09/27/2014   HIP   JOINT REPLACEMENT Left 12/01/2014   KNEE   KNEE ARTHROSCOPY Right    KNEE ARTHROSCOPY Left 03/23/2015   Procedure: ARTHROSCOPY KNEE, partial synovectomy;  Surgeon: Hessie Knows, MD;  Location: [facility];  Service: Orthopedics;  Laterality: Left;   LAPAROSCOPIC GASTRIC BANDING  2010   POLYPECTOMY N/A 09/08/2018   Procedure: POLYPECTOMY;  Surgeon: [name], MD;  Location: [facility];  Service: Endoscopy;  Laterality: N/A;  Stomach   ROTATOR CUFF REPAIR Bilateral    right shoulder 07-26-2016   SHOULDER ARTHROSCOPY Left 06/20/2015   Procedure: ARTHROSCOPY SHOULDER, REPAIR OF MASSIVE ROTATOR CUFF TEAR, TENODESIS, DECOMPRESSION DEBRIDEMENT;  Surgeon: Corky Mull, MD;  Location: [facility];  Service: Orthopedics;  Laterality: Left;   SHOULDER ARTHROSCOPY WITH OPEN ROTATOR CUFF REPAIR Right 12/22/2018   Procedure: SHOULDER ARTHROSCOPY WITH OPEN ROTATOR CUFF REPAIR;  Surgeon: Corky Mull, MD;  Location: [facility];  Service: Orthopedics;  Laterality: Right;   TOTAL HIP ARTHROPLASTY Left 09/27/2014   Procedure: TOTAL HIP ARTHROPLASTY ANTERIOR APPROACH;  Surgeon: Hessie Knows, MD;  Location: [facility];  Service: Orthopedics;  Laterality: Left;   TOTAL KNEE ARTHROPLASTY Left 12/01/2014   Procedure: TOTAL KNEE ARTHROPLASTY;  Surgeon: Hessie Knows, MD;  Location: [facility];  Service: Orthopedics;  Laterality: Left;   Family History  Problem Relation Age of Onset   Arthritis Mother    Hyperlipidemia Mother    Hypertension Mother    Diabetes Mother    Sleep apnea Mother    Obesity Mother    Arthritis Father    Hyperlipidemia Father    Hypertension Father    Mental illness Father    Diabetes Father    Obesity Father    Sleep apnea Father    Bipolar disorder Father    Anxiety disorder Father    Depression Father    Cancer Father    Kidney disease Father    Arthritis Maternal  Grandmother    Cancer Maternal Grandmother        breast cancer   Hyperlipidemia Maternal Grandmother    Hypertension Maternal Grandmother    Breast cancer Maternal Grandmother    Arthritis Maternal Grandfather    Hyperlipidemia Maternal Grandfather    Hypertension Maternal Grandfather    Heart disease Maternal Grandfather        heart attack   Breast cancer Maternal Grandfather    Breast cancer Paternal Grandmother    Social History   Socioeconomic History   Marital status: Divorced    Spouse name: Not on file   Number of children: 0   Years of education: Not on file   Highest education level: Not on file  Occupational History   Occupation:  Project analyst  Tobacco Use   Smoking status: Never   Smokeless tobacco: Never  Vaping Use   Vaping Use: Never used  Substance and Sexual Activity   Alcohol use: Yes    Alcohol/week: 0.0 - 2.0 standard drinks    Comment: occ.   Drug use: No   Sexual activity: Not Currently    Partners: Male    Birth control/protection: Surgical    Comment: Ablation   Other Topics Concern   Not on file  Social History Narrative   Not on file   Social Determinants of Health   Financial Resource Strain: Not on file  Food Insecurity: Not on file  Transportation Needs: Not on file  Physical Activity: Not on file  Stress: Not on file  Social Connections: Not on file     Review of Systems  Constitutional:  Negative for appetite change and unexpected weight change.  HENT:  Negative for congestion, sinus pressure and sore throat.   Eyes:  Negative for pain and visual disturbance.  Respiratory:  Negative for cough, chest tightness and shortness of breath.   Cardiovascular:  Negative for chest pain, palpitations and leg swelling.  Gastrointestinal:  Negative for abdominal pain, diarrhea, nausea and vomiting.       Reflux as outlined.   Genitourinary:  Negative for difficulty urinating and dysuria.  Musculoskeletal:  Negative for joint swelling  and myalgias.  Skin:  Negative for color change and rash.  Neurological:  Negative for dizziness, light-headedness and headaches.  Hematological:  Negative for adenopathy. Does not bruise/bleed easily.  Psychiatric/Behavioral:  Negative for agitation and dysphoric mood.       Objective:     BP 130/72    Pulse 76    Temp 97.9 F (36.6 C)    Resp 16    Ht _0  (1.727 m)    Wt 234 lb (106.1 kg)    SpO2 98%    BMI 35.58 kg/m  Wt Readings from Last 3 Encounters:  05/11/21 234 lb (106.1 kg)  04/30/21 235 lb 3.2 oz (106.7 kg)  11/14/20 246 lb (111.6 kg)    Physical Exam Vitals reviewed.  Constitutional:      General: She is not in acute distress.    Appearance: Normal appearance. She is well-developed.  HENT:     Head: Normocephalic and atraumatic.     Right Ear: External ear normal.     Left Ear: External ear normal.  Eyes:     General: No scleral icterus.       Right eye: No discharge.        Left eye: No discharge.     Conjunctiva/sclera: Conjunctivae normal.  Neck:     Thyroid: No thyromegaly.  Cardiovascular:     Rate and Rhythm: Normal rate and regular rhythm.  Pulmonary:     Effort: No tachypnea, accessory muscle usage or respiratory distress.     Breath sounds: Normal breath sounds. No decreased breath sounds or wheezing.  Chest:  Breasts:    Right: No inverted nipple, mass, nipple discharge or tenderness (no axillary adenopathy).     Left: No inverted nipple, mass, nipple discharge or tenderness (no axilarry adenopathy).  Abdominal:     General: Bowel sounds are normal.     Palpations: Abdomen is soft.     Tenderness: There is no abdominal tenderness.  Genitourinary:    Comments: Normal external genitalia.  Vaginal vault without lesions.  Cervix identified.  Pap smear performed.  Could not appreciate  any adnexal masses or tenderness.   Musculoskeletal:        General: No swelling or tenderness.     Cervical back: Neck supple. No tenderness.  Lymphadenopathy:      Cervical: No cervical adenopathy.  Skin:    Findings: No erythema or rash.  Neurological:     Mental Status: She is alert and oriented to person, place, and time.  Psychiatric:        Mood and Affect: Mood normal.        Behavior: Behavior normal.     Outpatient Encounter Medications as of 05/11/2021  Medication Sig   pantoprazole (PROTONIX) 40 MG tablet Take 1 tablet (40 mg total) by mouth daily.   allopurinol (ZYLOPRIM) 300 MG tablet Take 300 mg by mouth daily.   EPINEPHrine 0.3 mg/0.3 mL IJ SOAJ injection Inject 0.3 mLs (0.3 mg total) into the muscle as needed for anaphylaxis.   furosemide (LASIX) 40 MG tablet TAKE 1 TABLET(40 MG) BY MOUTH DAILY   gabapentin (NEURONTIN) 100 MG capsule Take 444m in the morning (1 3075mtablet and 1 10035mablet) and 1500m53m night (5 300mg56mlets)   levothyroxine (SYNTHROID) 137 MCG tablet Take 1 tablet (137 mcg total) by mouth daily.   potassium chloride SA (KLOR-CON M) 20 MEQ tablet TAKE 2 TABLETS BY MOUTH EVERY MORNING AND 1 TABLET EVERY EVENING   sertraline (ZOLOFT) 50 MG tablet TAKE 1 TABLET(50 MG) BY MOUTH DAILY   triamterene-hydrochlorothiazide (MAXZIDE-25) 37.5-25 MG tablet Take 1 tablet by mouth daily.   [DISCONTINUED] folic acid (FOLVITE) 1 MG tablet Take 1 mg by mouth daily.   [DISCONTINUED] furosemide (LASIX) 40 MG tablet TAKE 1 TABLET(40 MG) BY MOUTH DAILY   [DISCONTINUED] methotrexate (RHEUMATREX) 2.5 MG tablet Take 15 mg by mouth once a week.   [DISCONTINUED] pantoprazole (PROTONIX) 20 MG tablet Take 1 tablet (20 mg total) by mouth daily.   [DISCONTINUED] triamterene-hydrochlorothiazide (MAXZIDE-25) 37.5-25 MG tablet TAKE 1 TABLET BY MOUTH DAILY   Facility-Administered Encounter Medications as of 05/11/2021  Medication   0.9 %  sodium chloride infusion   cyanocobalamin ((VITAMIN B-12)) injection 1,000 mcg     Lab Results  Component Value Date   WBC 5.3 12/22/2020   HGB 13.4 12/22/2020   HCT 41.0 12/22/2020   PLT 263.0  12/22/2020   GLUCOSE 103 (H) 12/22/2020   CHOL 167 12/22/2020   TRIG 230.0 (H) 12/22/2020   HDL 43.30 12/22/2020   LDLDIRECT 102.0 12/22/2020   LDLCALC 148 (H) 05/10/2019   ALT 13 12/22/2020   AST 10 12/22/2020   NA 140 12/22/2020   K 3.3 (L) 12/22/2020   CL 96 12/22/2020   CREATININE 0.79 12/22/2020   BUN 16 12/22/2020   CO2 34 (H) 12/22/2020   TSH 3.29 12/22/2020   INR 0.99 03/08/2016   HGBA1C 5.8 12/22/2020    [place]  Result Date: 01/31/2021 CLINICAL DATA:  Neck nodule seen on CT EXAM: THYROID ULTRASOUND TECHNIQUE: Ultrasound examination of the thyroid gland and adjacent soft tissues was performed. COMPARISON:  None. FINDINGS: Parenchymal Echotexture: Moderately heterogeneous Isthmus: 0.4 cm Right lobe: 4.5 x 1.2 x 1.6 cm Left lobe: 4.4 x 1.5 x 2.2 cm _________________________________________________________ Estimated total number of nodules >/= 1 cm: 6 Number of spongiform nodules >/=  2 cm not described below (TR1): 0 Number of mixed cystic and solid nodules >/= 1.5 cm not described below (TR2): 0 _________________________________________________________ Nodule # 1: Location: Right; superior Maximum size: 1.0 cm; Other 2 dimensions: 0.9 x  0.8 cm Composition: solid/almost completely solid (2) Echogenicity: hypoechoic (2) Shape: not taller-than-wide (0) Margins: smooth (0) Echogenic foci: none (0) ACR TI-RADS total points: 4. ACR TI-RADS risk category: TR4 (4-6 points). ACR TI-RADS recommendations: *Given size (>/= 1 - 1.4 cm) and appearance, a follow-up ultrasound in 1 year should be considered based on TI-RADS criteria. _________________________________________________________ Nodule # 2: Location: Right; mid Maximum size: 1.0 cm; Other 2 dimensions: 0.9 x 0.8 cm Composition: cannot determine (2) Echogenicity: cannot determine (1) Shape: not taller-than-wide (0) Margins: smooth (0) Echogenic foci: peripheral calcifications (2) ACR TI-RADS total points: 5. ACR TI-RADS risk category: TR4  (4-6 points). ACR TI-RADS recommendations: *Given size (>/= 1 - 1.4 cm) and appearance, a follow-up ultrasound in 1 year should be considered based on TI-RADS criteria. _________________________________________________________ Nodule # 3: 1.6 x 1.6 x 1.5 cm ill-defined hypoechoic region in the right mid thyroid lobe favored to be a pseudo nodule given lack of defined margins. _________________________________________________________ Nodule # 4: Location: Right; inferior Maximum size: 1.2 cm; Other 2 dimensions: 0.9 x 0.8 cm Composition: solid/almost completely solid (2) Echogenicity: isoechoic (1) Shape: not taller-than-wide (0) Margins: ill-defined (0) Echogenic foci: none (0) ACR TI-RADS total points: 3. ACR TI-RADS risk category: TR3 (3 points). ACR TI-RADS recommendations: Given size (<1.4 cm) and appearance, this nodule does NOT meet TI-RADS criteria for biopsy or dedicated follow-up. _________________________________________________________ Nodule # 5: Location: Left; mid Maximum size: 3.4 cm; Other 2 dimensions: 2.2 x 1.2 cm Composition: solid/almost completely solid (2) Echogenicity: isoechoic (1) Shape: not taller-than-wide (0) Margins: smooth (0) Echogenic foci: none (0) ACR TI-RADS total points: 3. ACR TI-RADS risk category: TR3 (3 points). ACR TI-RADS recommendations: **Given size (>/= 2.5 cm) and appearance, fine needle aspiration of this mildly suspicious nodule should be considered based on TI-RADS criteria. _________________________________________________________ Nodule 6: 1.6 x 1.5 x 1.0 cm cystic nodule in the inferior left thyroid lobe does not meet criteria for imaging surveillance or FNA. IMPRESSION: 1. Nodule 5 (TI-RADS 3) located in the mid left thyroid lobe meets criteria for FNA. 2. Nodules 1 and 2 located in the right thyroid lobe meet criteria for imaging follow-up. Annual ultrasound surveillance is recommended until 5 years of stability is documented. The above is in keeping with the ACR  TI-RADS recommendations - J Am Coll Radiol 2017;14:587-595. Electronically Signed   By: Miachel Roux M.D.   On: 01/31/2021 15:11       Assessment & Plan:   Problem List Items Addressed This Visit     Essential hypertension, benign    On triam/hctz and takes lasix.  Has tried to decreased and has problems with increased swelling.  Blood pressure doing well.  Follow pressures.  Follow metabolic panel.       Relevant Medications   triamterene-hydrochlorothiazide (MAXZIDE-25) 37.5-25 MG tablet   furosemide (LASIX) 40 MG tablet   GERD (gastroesophageal reflux disease)    Had previously been on protonix.  Ran out of medication. Wants to change GI specialist to [place], since she is living in [place].  Discussed given return of symptoms off PPI, question of need for EGD.  Restart protonix.  Will start 9m q day.  Follow symptoms with plans to decrease back to 252monce controlled.  Referral to Dr PyHilarie Fredrickson      Relevant Medications   pantoprazole (PROTONIX) 40 MG tablet   Other Relevant Orders   Ambulatory referral to Gastroenterology   Health care maintenance    Physical today 05/11/21.  Mammogram 12/02/20 - Birads  I.  colonoscopy 05/2018 - recommended f/u in 3 years.        History of colon polyps    Colonoscopy 05/2018 - recommended f/u colonoscopy in 3 years.       Relevant Orders   Ambulatory referral to Gastroenterology   Hypercholesterolemia    The 10-year ASCVD risk score (Arnett DK, et al., 2019) is: 2.9%   Values used to calculate the score:     Age: 56 years     Sex: Female     Is Non-Hispanic African American: No     Diabetic: No     Tobacco smoker: No     Systolic Blood Pressure: 700 mmHg     Is BP treated: Yes     HDL Cholesterol: 43.3 mg/dL     Total Cholesterol: 167 mg/dL  Low cholesterol diet and exercise.  Follow lipid panel.       Relevant Medications   triamterene-hydrochlorothiazide (MAXZIDE-25) 37.5-25 MG tablet   furosemide (LASIX) 40 MG tablet    Hyperglycemia    Low carb diet and exercise.  Follow met b and a1c.       Hypothyroidism    On synthroid.  Seeing endocrinology - f/u thyroid nodules.  Follow tsh.  Planning for biopsy later this month.       Iron deficiency anemia following bariatric surgery    Follow cbc and ferritin.       Major depressive disorder, single episode, mild ([place])    Doing better.  Continue zoloft.       Numbness and tingling    Has been seeing neurology.  On gabapentin.  Request to see neurology in [place] for f/u.       Relevant Orders   Ambulatory referral to Neurology   Psoriatic arthritis ([place])    Continue cosentyx.  Follow.       Pulmonary nodule    Found incidentally on CT scan (05/22/20). .  CT chest ordered.  - needs f/u chest CT to better evaluate. Was ordered.  Has not been scheduled.  Need to reschedule.        Other Visit Diagnoses     Routine general medical examination at a health care facility    -  Primary   Cervical cancer screening       Relevant Orders   Cytology - PAP( [place])        Einar Pheasant, MD

## 2021-05-11 NOTE — Assessment & Plan Note (Signed)
Physical today 05/11/21.  Mammogram 12/02/20 - Birads I.  colonoscopy 05/2018 - recommended f/u in 3 years.

## 2021-05-12 ENCOUNTER — Encounter: Payer: Self-pay | Admitting: Internal Medicine

## 2021-05-12 ENCOUNTER — Telehealth: Payer: Self-pay | Admitting: Internal Medicine

## 2021-05-12 DIAGNOSIS — F320 Major depressive disorder, single episode, mild: Secondary | ICD-10-CM | POA: Insufficient documentation

## 2021-05-12 MED ORDER — TRIAMTERENE-HCTZ 37.5-25 MG PO TABS: 1.0000 | ORAL_TABLET | Freq: Every day | ORAL | 1 refills | Status: DC

## 2021-05-12 MED ORDER — FUROSEMIDE 40 MG PO TABS: ORAL_TABLET | ORAL | 1 refills | Status: DC

## 2021-05-12 NOTE — Assessment & Plan Note (Signed)
Colonoscopy 05/2018 - recommended f/u colonoscopy in 3 years.

## 2021-05-12 NOTE — Assessment & Plan Note (Addendum)
On synthroid.  Seeing endocrinology - f/u thyroid nodules.  Follow tsh.  Planning for biopsy later this month.

## 2021-05-12 NOTE — Assessment & Plan Note (Signed)
Low carb diet and exercise.  Follow met b and a1c.

## 2021-05-12 NOTE — Assessment & Plan Note (Signed)
On triam/hctz and takes lasix.  Has tried to decreased and has problems with increased swelling.  Blood pressure doing well.  Follow pressures.  Follow metabolic panel.

## 2021-05-12 NOTE — Assessment & Plan Note (Signed)
Has been seeing neurology.  On gabapentin.  Request to see neurology in [place] for f/u.

## 2021-05-12 NOTE — Assessment & Plan Note (Signed)
Had previously been on protonix.  Ran out of medication. Wants to change GI specialist to [place], since she is living in [place].  Discussed given return of symptoms off PPI, question of need for EGD.  Restart protonix.  Will start 40mg  q day.  Follow symptoms with plans to decrease back to 20mg  once controlled.  Referral to Dr Hilarie Fredrickson.

## 2021-05-12 NOTE — Assessment & Plan Note (Signed)
Follow cbc and ferritin.  

## 2021-05-12 NOTE — Assessment & Plan Note (Signed)
Continue cosentyx.  Follow.

## 2021-05-12 NOTE — Assessment & Plan Note (Signed)
Doing better.  Continue zoloft.

## 2021-05-12 NOTE — Telephone Encounter (Signed)
Please call pt and notify her that after reviewing the chart, it appears she needs a f/u CT chest - to f/u regarding an incidental finding on a previous CT.  (Small nodule - recommend f/u chest CT).  Need to schedule if agreeable.

## 2021-05-12 NOTE — Assessment & Plan Note (Signed)
Found incidentally on CT scan (05/22/20). .  CT chest ordered.  - needs f/u chest CT to better evaluate. Was ordered.  Has not been scheduled.  Need to reschedule.

## 2021-05-12 NOTE — Assessment & Plan Note (Signed)
The 10-year ASCVD risk score (Arnett DK, et al., 2019) is: 2.9%   Values used to calculate the score:     Age: 57 years     Sex: Female     Is Non-Hispanic African American: No     Diabetic: No     Tobacco smoker: No     Systolic Blood Pressure: 341 mmHg     Is BP treated: Yes     HDL Cholesterol: 43.3 mg/dL     Total Cholesterol: 167 mg/dL  Low cholesterol diet and exercise.  Follow lipid panel.

## 2021-05-14 ENCOUNTER — Encounter: Payer: Self-pay | Admitting: Neurology

## 2021-05-15 ENCOUNTER — Other Ambulatory Visit: Payer: Self-pay | Admitting: Internal Medicine

## 2021-05-15 DIAGNOSIS — R918 Other nonspecific abnormal finding of lung field: Secondary | ICD-10-CM

## 2021-05-15 NOTE — Telephone Encounter (Signed)
Pt agreeable to schedule chest CT

## 2021-05-15 NOTE — Progress Notes (Signed)
Order placed for chest CT 

## 2021-05-15 NOTE — Telephone Encounter (Signed)
Order placed for CT chest.  °

## 2021-05-16 ENCOUNTER — Telehealth: Payer: Self-pay | Admitting: Internal Medicine

## 2021-05-16 LAB — LIPID PANEL
Cholesterol: 222 — AB (ref 0–200)
HDL: 46 (ref 35–70)
LDL Cholesterol: 135
Triglycerides: 266 — AB (ref 40–160)

## 2021-05-16 LAB — CYTOLOGY - PAP
Comment: NEGATIVE
Diagnosis: NEGATIVE
High risk HPV: NEGATIVE

## 2021-05-16 LAB — BASIC METABOLIC PANEL: Glucose: 93

## 2021-05-16 NOTE — Telephone Encounter (Signed)
Lft pt vm to call ofc to sch CT. thanks 

## 2021-05-21 ENCOUNTER — Encounter: Payer: Self-pay | Admitting: Internal Medicine

## 2021-05-21 NOTE — Telephone Encounter (Signed)
See me about this before calling.  Please clarify how she is taking the gabapentin.

## 2021-05-22 ENCOUNTER — Ambulatory Visit
Admission: RE | Admit: 2021-05-22 | Discharge: 2021-05-22 | Disposition: A | Discharge status: Home / Self Care | Payer: 59 | Source: Ambulatory Visit | Attending: Internal Medicine | Admitting: Internal Medicine

## 2021-05-22 ENCOUNTER — Encounter: Payer: Self-pay | Admitting: Physician Assistant

## 2021-05-22 ENCOUNTER — Other Ambulatory Visit (HOSPITAL_COMMUNITY)
Admission: RE | Admit: 2021-05-22 | Discharge: 2021-05-22 | Disposition: A | Discharge status: Home / Self Care | Payer: 59 | Source: Ambulatory Visit | Attending: Internal Medicine | Admitting: Internal Medicine

## 2021-05-22 DIAGNOSIS — E042 Nontoxic multinodular goiter: Secondary | ICD-10-CM | POA: Diagnosis not present

## 2021-05-22 NOTE — Telephone Encounter (Signed)
Per our conversation, I s/w patient. She was at another Dr's appointment so quickly I explained to her To please call Neurology and have them fill rx due to them following her now. Patient was agreeable to that, stated no problem. Patient will call us if there is any issue.

## 2021-05-28 NOTE — Telephone Encounter (Signed)
My chart message sent to pt.  Usually the original prescribing MD will prescribe the medication until can get established with new MD (after someone has moved).  See message.

## 2021-05-29 ENCOUNTER — Other Ambulatory Visit: Payer: 59

## 2021-05-29 LAB — CYTOLOGY - NON PAP

## 2021-05-29 NOTE — Telephone Encounter (Signed)
See if can get in touch with Dr Melrose Nakayama and let them know that Lori Le has moved. Can they refill her gabapentin for 1-2 more months until she can get established with a new neurologist.  If unable to get in touch, I can refill x 1.

## 2021-05-30 NOTE — Telephone Encounter (Signed)
Spoke with Dr Weber Cooks office. Message was sent back to nurse high priority to if ok for him to fill until patient sees new neurologist. They are aware that she is out of medication and will call patient today. Nurse was dealing with emergency at time of call. ?

## 2021-05-31 ENCOUNTER — Encounter: Payer: Self-pay | Admitting: Hematology and Oncology

## 2021-05-31 ENCOUNTER — Ambulatory Visit (INDEPENDENT_AMBULATORY_CARE_PROVIDER_SITE_OTHER): Payer: 59 | Admitting: Physician Assistant

## 2021-05-31 ENCOUNTER — Encounter: Payer: Self-pay | Admitting: Physician Assistant

## 2021-05-31 VITALS — BP 112/72 | HR 67 | Ht 68.0 in | Wt 243.0 lb

## 2021-05-31 DIAGNOSIS — K5900 Constipation, unspecified: Secondary | ICD-10-CM | POA: Diagnosis not present

## 2021-05-31 DIAGNOSIS — Z86010 Personal history of colonic polyps: Secondary | ICD-10-CM

## 2021-05-31 DIAGNOSIS — K219 Gastro-esophageal reflux disease without esophagitis: Secondary | ICD-10-CM | POA: Diagnosis not present

## 2021-05-31 DIAGNOSIS — Z9884 Bariatric surgery status: Secondary | ICD-10-CM

## 2021-05-31 MED ORDER — PANTOPRAZOLE SODIUM 40 MG PO TBEC
40.0000 mg | DELAYED_RELEASE_TABLET | Freq: Every day | ORAL | 3 refills | Status: DC
Start: 2021-05-31 — End: 2021-11-19

## 2021-05-31 MED ORDER — NA SULFATE-K SULFATE-MG SULF 17.5-3.13-1.6 GM/177ML PO SOLN: 1.0000 | Freq: Once | ORAL | 0 refills | Status: AC

## 2021-05-31 NOTE — Progress Notes (Signed)
Chief Complaint: GERD, H/o Polyps  HPI:    Lori Le is a 56 year old Caucasian female with a past medical history as listed below including Roux-en-Y gastric bypass, reflux and polyps as well as previous gastric ulcers, who was referred to me by Einar Pheasant, MD for her history of reflux and polyps.    05/11/2018 colonoscopy with 110 mm polyp in the cecum, one 3 mm polyp in the transverse colon, one 7 mm polyp in the transverse colon, one 6 mm polyp in the transverse colon and otherwise normal.  Repeat recommended in 3 years with a 2-day bowel prep.  Pathology showed tubular adenomas.    09/08/2018 EGD with salmon-colored mucosa suspicious for short segment Barrett's esophagus, a few gastric polyps and Roux-en-Y gastrojejunostomy with gastrojejunal anastomosis characterized by healthy-appearing mucosa.  Repeat recommended in 3 years.  Pathology showed gastric xanthoma, fundic gland polyps and oxyntic gland hyperplasia consistent with PPI effect from the stomach polyps and squamocolumnar mucosa with mild chronic inflammation from the GE junction.    Today, patient tells me she recently moved to [facility] from [place] and is trying to establish care here with her specialists.  Previously seen by [facility] and tells me she had regular EGDs due to her history of gastric ulcers on multiple occasions and ongoing reflux symptoms.  Also had regular follow-up with colonoscopies given her history of polyps.  Currently patient tells me that she ran out of Pantoprazole about a month or so ago and started with a feeling like someone "hit her stomach", tells this is typically how she feels when she has ulcers.  She went to see her PCP who restarted her Pantoprazole 40 mg daily and since then she feels well but if she misses even 1 dose she has symptoms.    Does tell me that on her previous colonoscopy she required a 2-day bowel prep and would prefer MiraLAX as this is worked well for her in the past.    We  discussed that patient's regular bowel habits trend towards incomplete evacuation and constipation.  Apparently she has tried Stage manager, MiraLAX and suppositories occasionally when she gets very constipated but has never been on anything regularly.  Tells me she already drinks a lot of water.    Denies fever, chills, family history of colon cancer, weight loss, abdominal pain, change in bowel habits or blood in her stool.  Past Medical History:  Diagnosis Date   Allergic rhinitis due to allergen    Allergy    Anemia    Anxiety    Arthritis    B12 deficiency    Cancer (HCC)    skin ca-melanoma   Chronic idiopathic urticaria    Complication of anesthesia    nausea, slow to wake up   Depression    Edema    Family history of adverse reaction to anesthesia    Father - PONV   Gastric ulcer    GERD (gastroesophageal reflux disease)    Gout    Gout    Hashimoto's thyroiditis    Heart murmur    History of eating disorder    History of kidney stones    h/o years ago   Hives    Hyperlipidemia    Hypertension    Hypothyroidism    Lower extremity edema    Melanoma in situ ([place])    left shoulder   Migraine    approx 4x/yr-migraines   Mitral valve disorder    Motion sickness  all moving vehicles   Neuropathy    fingers and feet   PCOS (polycystic ovarian syndrome)    PONV (postoperative nausea and vomiting)    Psoriatic arthritis (HCC)    Thyroid disease    Ulcer    Vertigo    last episode over 1 yr ago    Past Surgical History:  Procedure Laterality Date   BONE EXCISION Left 09/03/2018   Procedure: PART EXCISION BONE-PHALANX 2,3,4 LEFT;  Surgeon: Albertine Patricia, DPM;  Location: [place];  Service: Podiatry;  Laterality: Left;   BREAST BIOPSY Right 2007   neg- bx/clip   CHOLECYSTECTOMY  2003   COLONOSCOPY WITH PROPOFOL N/A 05/11/2018   Procedure: COLONOSCOPY WITH PROPOFOL;  Surgeon: [name], MD;  Location: [facility];  Service: Endoscopy;   Laterality: N/A;   DILITATION & CURRETTAGE/HYSTROSCOPY WITH NOVASURE ABLATION N/A 08/19/2016   Procedure: DILATATION & CURETTAGE/HYSTEROSCOPY WITH NOVASURE ABLATION;  Surgeon: [name], MD;  Location: [facility];  Service: Gynecology;  Laterality: N/A;   ESOPHAGOGASTRODUODENOSCOPY (EGD) WITH PROPOFOL N/A 12/05/2015   Procedure: ESOPHAGOGASTRODUODENOSCOPY (EGD) WITH PROPOFOL;  Surgeon: Lollie Sails, MD;  Location: [facility] ENDOSCOPY;  Service: Endoscopy;  Laterality: N/A;   ESOPHAGOGASTRODUODENOSCOPY (EGD) WITH PROPOFOL N/A 02/06/2016   Procedure: ESOPHAGOGASTRODUODENOSCOPY (EGD) WITH PROPOFOL;  Surgeon: Lollie Sails, MD;  Location: [facility]COPY;  Service: Endoscopy;  Laterality: N/A;   ESOPHAGOGASTRODUODENOSCOPY (EGD) WITH PROPOFOL N/A 05/11/2018   Procedure: ESOPHAGOGASTRODUODENOSCOPY (EGD) WITH PROPOFOL;  Surgeon: [name], MD;  Location: [facility];  Service: Endoscopy;  Laterality: N/A;   ESOPHAGOGASTRODUODENOSCOPY (EGD) WITH PROPOFOL N/A 09/08/2018   Procedure: ESOPHAGOGASTRODUODENOSCOPY (EGD) WITH BIOPSY;  Surgeon: [name], MD;  Location: [place];  Service: Endoscopy;  Laterality: N/A;   FOOT ARTHRODESIS Left 09/03/2018   Procedure: ARTHRODESIS; HALLUX/IP JOINT LEFT;  Surgeon: Albertine Patricia, DPM;  Location: [place];  Service: Podiatry;  Laterality: Left;  [place]   GASTRIC BYPASS  2011   JOINT REPLACEMENT Left 09/27/2014   HIP   JOINT REPLACEMENT Left 12/01/2014   KNEE   KNEE ARTHROSCOPY Right    KNEE ARTHROSCOPY Left 03/23/2015   Procedure: ARTHROSCOPY KNEE, partial synovectomy;  Surgeon: Hessie Knows, MD;  Location: [facility];  Service: Orthopedics;  Laterality: Left;   LAPAROSCOPIC GASTRIC BANDING  2010   POLYPECTOMY N/A 09/08/2018   Procedure: POLYPECTOMY;  Surgeon: [name], MD;  Location: [place];  Service: Endoscopy;  Laterality: N/A;  Stomach   ROTATOR CUFF REPAIR  Bilateral    right shoulder 07-26-2016   SHOULDER ARTHROSCOPY Left 06/20/2015   Procedure: ARTHROSCOPY SHOULDER, REPAIR OF MASSIVE ROTATOR CUFF TEAR, TENODESIS, DECOMPRESSION DEBRIDEMENT;  Surgeon: Corky Mull, MD;  Location: [facility];  Service: Orthopedics;  Laterality: Left;   SHOULDER ARTHROSCOPY WITH OPEN ROTATOR CUFF REPAIR Right 12/22/2018   Procedure: SHOULDER ARTHROSCOPY WITH OPEN ROTATOR CUFF REPAIR;  Surgeon: Corky Mull, MD;  Location: [facility];  Service: Orthopedics;  Laterality: Right;   TOTAL HIP ARTHROPLASTY Left 09/27/2014   Procedure: TOTAL HIP ARTHROPLASTY ANTERIOR APPROACH;  Surgeon: Hessie Knows, MD;  Location: [facility];  Service: Orthopedics;  Laterality: Left;   TOTAL KNEE ARTHROPLASTY Left 12/01/2014   Procedure: TOTAL KNEE ARTHROPLASTY;  Surgeon: Hessie Knows, MD;  Location: [facility];  Service: Orthopedics;  Laterality: Left;    Current Outpatient Medications  Medication Sig Dispense Refill   allopurinol (ZYLOPRIM) 300 MG tablet Take 300 mg by mouth daily.  EPINEPHrine 0.3 mg/0.3 mL IJ SOAJ injection Inject 0.3 mLs (0.3 mg total) into the muscle as needed for anaphylaxis. 1 each 0   furosemide (LASIX) 40 MG tablet TAKE 1 TABLET(40 MG) BY MOUTH DAILY 90 tablet 1   gabapentin (NEURONTIN) 100 MG capsule Take 400mg  in the morning (1 300mg  tablet and 1 100mg  tablet) and 1500mg  at night (5 300mg  tablets)     gabapentin (NEURONTIN) 300 MG capsule Take 300 mg by mouth daily.     levothyroxine (SYNTHROID) 137 MCG tablet Take 1 tablet (137 mcg total) by mouth daily. 90 tablet 3   pantoprazole (PROTONIX) 40 MG tablet Take 1 tablet (40 mg total) by mouth daily. 90 tablet 1   potassium chloride SA (KLOR-CON M) 20 MEQ tablet TAKE 2 TABLETS BY MOUTH EVERY MORNING AND 1 TABLET EVERY EVENING 270 tablet 1   sertraline (ZOLOFT) 50 MG tablet TAKE 1 TABLET(50 MG) BY MOUTH DAILY 90 tablet 1   triamterene-hydrochlorothiazide (MAXZIDE-25) 37.5-25 MG tablet Take 1 tablet by mouth daily. 90  tablet 1   No current facility-administered medications for this visit.   Facility-Administered Medications Ordered in Other Visits  Medication Dose Route Frequency Provider Last Rate Last Admin   0.9 %  sodium chloride infusion   Intravenous Continuous Mike Gip, Melissa C, MD 0 mL/hr at 05/08/17 1437 New Bag at 05/11/18 1437   cyanocobalamin ((VITAMIN B-12)) injection 1,000 mcg  1,000 mcg Intramuscular Once Lequita Asal, MD        Allergies as of 05/31/2021 - Review Complete 05/12/2021  Allergen Reaction Noted   Contrast media [iodinated contrast media] Anaphylaxis 05/24/2017   Etodolac Anaphylaxis 02/05/2017   Alpha-gal  08/27/2018   Influenza vaccines Hives 12/09/2013   Lactose Nausea And Vomiting 04/25/2015   Lactose intolerance (gi) Nausea And Vomiting 09/27/2014   Bacitracin-neomycin-polymyxin Rash 04/25/2015   Neomycin-bacitracin zn-polymyx Rash    Neosporin + pain relief max st [neomy-bacit-polymyx-pramoxine] Rash 07/31/2012    Family History  Problem Relation Age of Onset   Arthritis Mother    Hyperlipidemia Mother    Hypertension Mother    Diabetes Mother    Sleep apnea Mother    Obesity Mother    Arthritis Father    Hyperlipidemia Father    Hypertension Father    Mental illness Father    Diabetes Father    Obesity Father    Sleep apnea Father    Bipolar disorder Father    Anxiety disorder Father    Depression Father    Cancer Father    Kidney disease Father    Arthritis Maternal Grandmother    Cancer Maternal Grandmother        breast cancer   Hyperlipidemia Maternal Grandmother    Hypertension Maternal Grandmother    Breast cancer Maternal Grandmother    Arthritis Maternal Grandfather    Hyperlipidemia Maternal Grandfather    Hypertension Maternal Grandfather    Heart disease Maternal Grandfather        heart attack   Breast cancer Maternal Grandfather    Breast cancer Paternal Grandmother     Social History   Socioeconomic History    Marital status: Divorced    Spouse name: Not on file   Number of children: 0   Years of education: Not on file   Highest education level: Not on file  Occupational History   Occupation: Chiropractor  Tobacco Use   Smoking status: Never   Smokeless tobacco: Never  Vaping Use   Vaping Use: Never used  Substance and Sexual Activity   Alcohol use: Yes    Alcohol/week: 0.0 - 2.0 standard drinks    Comment: occ.   Drug use: No   Sexual activity: Not Currently    Partners: Male    Birth control/protection: Surgical    Comment: Ablation   Other Topics Concern   Not on file  Social History Narrative   Not on file   Social Determinants of Health   Financial Resource Strain: Not on file  Food Insecurity: Not on file  Transportation Needs: Not on file  Physical Activity: Not on file  Stress: Not on file  Social Connections: Not on file  Intimate Partner Violence: Not on file    Review of Systems:    Constitutional: No weight loss, fever or chills Skin: No rash  Cardiovascular: No chest pain Respiratory: No SOB Gastrointestinal: See HPI and otherwise negative Genitourinary: No dysuria Neurological: No headache, dizziness or syncope Musculoskeletal: No new muscle or joint pain Hematologic: No bleeding  Psychiatric: No history of depression or anxiety   Physical Exam:  Vital signs: BP 112/72    Pulse 67    Ht 5\' 8"  (1.727 m)    Wt 243 lb (110.2 kg)    BMI 36.95 kg/m   Constitutional:   Pleasant overweight Caucasian female appears to be in NAD, Well developed, Well nourished, alert and cooperative Head:  Normocephalic and atraumatic. Eyes:   PEERL, EOMI. No icterus. Conjunctiva pink. Ears:  Normal auditory acuity. Neck:  Supple Throat: Oral cavity and pharynx without inflammation, swelling or lesion.  Respiratory: Respirations even and unlabored. Lungs clear to auscultation bilaterally.   No wheezes, crackles, or rhonchi.  Cardiovascular: Normal S1, S2. No MRG. Regular  rate and rhythm. No peripheral edema, cyanosis or pallor.  Gastrointestinal:  Soft, nondistended, nontender. No rebound or guarding. Normal bowel sounds. No appreciable masses or hepatomegaly. Rectal:  Not performed.  Msk:  Symmetrical without gross deformities. Without edema, no deformity or joint abnormality.  Neurologic:  Alert and  oriented x4;  grossly normal neurologically.  Skin:   Dry and intact without significant lesions or rashes. Psychiatric: Demonstrates good judgement and reason without abnormal affect or behaviors.  MOST RECENT LABS AND IMAGING: CBC    Component Value Date/Time   WBC 5.3 12/22/2020 0737   RBC 4.37 12/22/2020 0737   HGB 13.4 12/22/2020 0737   HGB 13.7 05/10/2019 0944   HCT 41.0 12/22/2020 0737   HCT 40.0 05/10/2019 0944   PLT 263.0 12/22/2020 0737   PLT 257 05/10/2019 0944   MCV 94.0 12/22/2020 0737   MCV 88 05/10/2019 0944   MCH 29.3 06/29/2020 0923   MCHC 32.7 12/22/2020 0737   RDW 14.9 12/22/2020 0737   RDW 12.5 05/10/2019 0944   LYMPHSABS 2.1 12/22/2020 0737   LYMPHSABS 1.7 05/10/2019 0944   MONOABS 0.4 12/22/2020 0737   EOSABS 0.2 12/22/2020 0737   EOSABS 0.1 05/10/2019 0944   BASOSABS 0.0 12/22/2020 0737   BASOSABS 0.1 05/10/2019 0944    CMP     Component Value Date/Time   NA 140 12/22/2020 0737   NA 141 05/10/2019 0944   K 3.3 (L) 12/22/2020 0737   K 4.0 09/27/2014 0000   CL 96 12/22/2020 0737   CO2 34 (H) 12/22/2020 0737   GLUCOSE 103 (H) 12/22/2020 0737   BUN 16 12/22/2020 0737   BUN 17 05/10/2019 0944   CREATININE 0.79 12/22/2020 0737   CREATININE 0.81 09/12/2017 1529   CALCIUM 9.7 12/22/2020 0737  PROT 6.9 12/22/2020 0737   PROT 6.5 05/10/2019 0944   ALBUMIN 4.3 12/22/2020 0737   ALBUMIN 4.1 05/10/2019 0944   AST 10 12/22/2020 0737   ALT 13 12/22/2020 0737   ALKPHOS 71 12/22/2020 0737   BILITOT 0.5 12/22/2020 0737   BILITOT 0.3 05/10/2019 0944   GFRNONAA >60 05/27/2019 1808   GFRAA >60 05/27/2019 1808     Assessment: 1.  GERD: History of PUD in the past as well as question of Barrett's esophagus, though biopsies were negative, last EGD 09/08/2018 with repeat recommended in 3 years 2.  History of tubular adenomatous: last colonoscopy in February 2020 with repeat recommended in 3 years 3.  History of constipation: Chronic for the patient over the past 3 to 4 years 4.  Previous Roux-en-Y gastric bypass  Plan: 1.  Patient has required a 2-day bowel prep in the past.  Will give her 2-day bowel prep this time as well with MiraLAX per her request.  Scheduled patient for an EGD and colonoscopy in the [place] with Dr. Candis Schatz.  Did provide the patient a detailed list of risks for the procedures and she agrees to proceed. Patient is appropriate for endoscopic procedure(s) in the ambulatory ([facility]) setting.  2.  Refilled patient's Pantoprazole 40 mg daily, 30-60 minutes before breakfast #90 with 3 refills 3.  Discussed constipation.  Recommend the patient start a daily fiber supplement to see if this helps over the next 3 to 4 weeks, if it does not would add in a dose of MiraLAX daily.  We discussed this in detail. 4.  Patient to follow in clinic per recommendations from Dr. Candis Schatz after time of procedure.  Ellouise Newer, PA-C [facility] 05/31/2021, 8:30 AM  Cc: Einar Pheasant, MD

## 2021-05-31 NOTE — Patient Instructions (Signed)
We have sent the following medications to your pharmacy for you to pick up at your convenience: ?Pantoprazole 40 mg daily 30-60 minutes before breakfast.  ? ?You have been scheduled for an endoscopy and colonoscopy. Please follow the written instructions given to you at your visit today. ?Please pick up your prep supplies at the pharmacy within the next 1-3 days. ?If you use inhalers (even only as needed), please bring them with you on the day of your procedure. ? ?If you are age 18 or older, your body mass index should be between 23-30. Your Body mass index is 36.95 kg/m?[name] If this is out of the aforementioned range listed, please consider follow up with your Primary Care Provider. ? ?If you are age 16 or younger, your body mass index should be between 19-25. Your Body mass index is 36.95 kg/m?[name] If this is out of the aformentioned range listed, please consider follow up with your Primary Care Provider.  ? ?________________________________________________________ ? ?The [facility] providers would like to encourage you to use [facility] to communicate with providers for non-urgent requests or questions.  Due to long hold times on the telephone, sending your provider a message by [facility] may be a faster and more efficient way to get a response.  Please allow 48 business hours for a response.  Please remember that this is for non-urgent requests.  ?_______________________________________________________ ? ?

## 2021-06-04 NOTE — Progress Notes (Addendum)
Agree with the assessment and plan as outlined by Ellouise Newer, PA-C. ?If EGD negative for Barrett's on this endoscopy, would discontinue surveillance. ? ?Also, the cecal polyp in 2020 was 10 mm (typo in PA [facility]'s note) ? ?Caponig[place] E. Candis Schatz, MD ? ?

## 2021-06-07 ENCOUNTER — Other Ambulatory Visit: Payer: Self-pay

## 2021-06-07 ENCOUNTER — Other Ambulatory Visit (INDEPENDENT_AMBULATORY_CARE_PROVIDER_SITE_OTHER): Payer: 59

## 2021-06-07 DIAGNOSIS — E042 Nontoxic multinodular goiter: Secondary | ICD-10-CM

## 2021-06-07 LAB — T4, FREE: Free T4: 1.18 ng/dL (ref 0.60–1.60)

## 2021-06-07 LAB — TSH: TSH: 0.95 u[IU]/mL (ref 0.35–5.50)

## 2021-06-08 ENCOUNTER — Ambulatory Visit
Admission: RE | Admit: 2021-06-08 | Discharge: 2021-06-08 | Disposition: A | Discharge status: Home / Self Care | Payer: 59 | Source: Ambulatory Visit | Attending: Internal Medicine | Admitting: Internal Medicine

## 2021-06-08 DIAGNOSIS — R918 Other nonspecific abnormal finding of lung field: Secondary | ICD-10-CM

## 2021-07-02 ENCOUNTER — Encounter: Payer: Self-pay | Admitting: Neurology

## 2021-07-02 ENCOUNTER — Encounter: Payer: Self-pay | Admitting: Internal Medicine

## 2021-07-02 ENCOUNTER — Ambulatory Visit (INDEPENDENT_AMBULATORY_CARE_PROVIDER_SITE_OTHER): Payer: 59 | Admitting: Neurology

## 2021-07-02 VITALS — BP 126/81 | HR 73 | Ht 68.0 in | Wt 239.0 lb

## 2021-07-02 DIAGNOSIS — G629 Polyneuropathy, unspecified: Secondary | ICD-10-CM

## 2021-07-02 DIAGNOSIS — M79604 Pain in right leg: Secondary | ICD-10-CM

## 2021-07-02 MED ORDER — GABAPENTIN 300 MG PO CAPS: 1800.0000 mg | ORAL_CAPSULE | Freq: Every day | ORAL | 3 refills | Status: DC

## 2021-07-02 MED ORDER — DULOXETINE HCL 30 MG PO CPEP: 30.0000 mg | ORAL_CAPSULE | Freq: Every day | ORAL | 3 refills | Status: DC

## 2021-07-02 NOTE — Progress Notes (Signed)
?[employer] ?Neurology Division ?Clinic Note - Initial Visit ? ? ?Date: 07/02/21 ? ?Lori Le ?MRN: [id number] ?DOB: 09-Feb-1966 ? ? ?Dear Dr. Nicki Reaper: ? ?Thank you for your kind referral of Lori Le for consultation of generalized numbness/tingling. Although her history is well known to you, please allow Lori Le to reiterate it for the purpose of our medical record. The patient was accompanied to the clinic by self. ?  ? ?History of Present Illness: ?Lori Le is a 56 y.o. right-handed female with hypertension, mitral valve disease, Hashimoto's thyroiditis, psoariatic arthritis, chronic venous insufficiency, and history of bariatric surgery presenting for evaluation of generalized numbness/tingling.  ? ?Starting around ~5 years ago, she began having numbness/tingling/burning involving the toes, feet, fingers, and lower legs. She has numbness in the toes.  Symptoms are constant and worse at night.  Occasionally, she has sharp electrical pain.  She has some weakness and imbalance.  No falls.  She complains of right leg pain which started in the low back/hip and radiated over her medial thigh; it is triggered by prolonged sitting.  ? ?Previously evaluated by Dr. Melrose Nakayama at [facility] where EMG shows neuropathy involving the legs.  She was being managed with gabapentin '400mg'$  in the morning and '1500mg'$  at bedtime.  However, she typically takes gabapentin '100mg'$  in the morning and '1800mg'$  at bedtime.  She has not tried any other medication.   ? ?She moved to [facility] a few months ago and is establishing care.  She works from home for [employer].  She lives alone in 2 level home.  No smoker. Socially drinks alcohol.  No diabetes.   ? ?Out-side paper records, electronic medical record, and images have been reviewed where available and summarized as:  ?EMG legs performed at [facility] 09/02/2018 : ? ?Impression: ?Abnormal study. 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  ?  ? ?Lab Results  ?Component Value Date  ? HGBA1C 5.8 12/22/2020  ? ?Lab Results  ?Component Value Date  ? OVZCHYIF02 598 06/17/2018  ? ?Lab Results  ?Component Value Date  ? TSH 0.95 06/07/2021  ? ?Lab Results  ?Component Value Date  ? ESRSEDRATE 18 02/28/2015  ? ? ?Past Medical History:  ?Diagnosis Date  ? Allergic rhinitis due to allergen   ? Allergy   ? Anemia   ? Anxiety   ? Arthritis   ? B12 deficiency   ? Cancer [facility])   ? skin ca-melanoma  ? Chronic idiopathic urticaria   ? Complication of anesthesia   ? nausea, slow to wake up  ? Depression   ? Edema   ? Family history of adverse reaction to anesthesia   ? Father - PONV  ? Gastric ulcer   ? GERD (gastroesophageal reflux disease)   ? Gout   ? Gout   ? Hashimoto's thyroiditis   ? Heart murmur   ? History of eating disorder   ? History of kidney stones   ? h/o years ago  ? Hives   ? Hyperlipidemia   ? Hypertension   ? Hypothyroidism   ? Lower extremity edema   ? Melanoma in situ [facility])   ? left shoulder  ? Migraine   ? approx 4x/yr-migraines  ? Mitral valve disorder   ? Motion sickness   ? all moving vehicles  ? Neuropathy   ? fingers and feet  ? PCOS (polycystic ovarian syndrome)   ? PONV (postoperative nausea and vomiting)   ?  Psoriatic arthritis ([place])   ? Thyroid disease   ? Ulcer   ? Vertigo   ? last episode over 1 yr ago  ? ? ?Past Surgical History:  ?Procedure Laterality Date  ? BONE EXCISION Left 09/03/2018  ? Procedure: PART EXCISION BONE-PHALANX 2,3,4 LEFT;  Surgeon: Albertine Patricia, DPM;  Location: [place];  Service: Podiatry;  Laterality: Left;  ? BREAST BIOPSY Right 2007  ? neg- bx/clip  ? CHOLECYSTECTOMY  2003  ? COLONOSCOPY WITH PROPOFOL N/A 05/11/2018  ? Procedure: COLONOSCOPY WITH PROPOFOL;  Surgeon: [name], MD;  Location: [facility] ENDOSCOPY;  Service: Endoscopy;  Laterality: N/A;  ? DILITATION & CURRETTAGE/HYSTROSCOPY WITH NOVASURE ABLATION N/A 08/19/2016  ? Procedure: DILATATION &  CURETTAGE/HYSTEROSCOPY WITH NOVASURE ABLATION;  Surgeon: [name], MD;  Location: [facility] ORS;  Service: Gynecology;  Laterality: N/A;  ? ESOPHAGOGASTRODUODENOSCOPY (EGD) WITH PROPOFOL N/A 12/05/2015  ? Procedure: ESOPHAGOGASTRODUODENOSCOPY (EGD) WITH PROPOFOL;  Surgeon: Lollie Sails, MD;  Location: [facility] ENDOSCOPY;  Service: Endoscopy;  Laterality: N/A;  ? ESOPHAGOGASTRODUODENOSCOPY (EGD) WITH PROPOFOL N/A 02/06/2016  ? Procedure: ESOPHAGOGASTRODUODENOSCOPY (EGD) WITH PROPOFOL;  Surgeon: Lollie Sails, MD;  Location: [facility] END[facility]COPY;  Service: Endoscopy;  Laterality: N/A;  ? ESOPHAGOGASTRODUODENOSCOPY (EGD) WITH PROPOFOL N/A 05/11/2018  ? Procedure: ESOPHAGOGASTRODUODENOSCOPY (EGD) WITH PROPOFOL;  Surgeon: [name], MD;  Location: [facility] ENDOSCOPY;  Service: Endoscopy;  Laterality: N/A;  ? ESOPHAGOGASTRODUODENOSCOPY (EGD) WITH PROPOFOL N/A 09/08/2018  ? Procedure: ESOPHAGOGASTRODUODENOSCOPY (EGD) WITH BIOPSY;  Surgeon: [name], MD;  Location: [place];  Service: Endoscopy;  Laterality: N/A;  ? FOOT ARTHRODESIS Left 09/03/2018  ? Procedure: ARTHRODESIS; HALLUX/IP JOINT LEFT;  Surgeon: Albertine Patricia, DPM;  Location: [place];  Service: Podiatry;  Laterality: Left;  [facility] LOCAL  ? FOOT SURGERY  1994  ? GASTRIC BYPASS  2011  ? JOINT REPLACEMENT Left 09/27/2014  ? HIP  ? JOINT REPLACEMENT Left 12/01/2014  ? KNEE  ? KNEE ARTHROSCOPY Right   ? KNEE ARTHROSCOPY Left 03/23/2015  ? Procedure: ARTHROSCOPY KNEE, partial synovectomy;  Surgeon: Hessie Knows, MD;  Location: [facility];  Service: Orthopedics;  Laterality: Left;  ? LAPAROSCOPIC GASTRIC BANDING  2010  ? POLYPECTOMY N/A 09/08/2018  ? Procedure: POLYPECTOMY;  Surgeon: [name], MD;  Location: [place];  Service: Endoscopy;  Laterality: N/A;  Stomach  ? ROTATOR CUFF REPAIR Bilateral   ? right shoulder 07-26-2016  ? SHOULDER ARTHROSCOPY Left 06/20/2015  ? Procedure: ARTHROSCOPY SHOULDER, REPAIR  OF MASSIVE ROTATOR CUFF TEAR, TENODESIS, DECOMPRESSION DEBRIDEMENT;  Surgeon: Corky Mull, MD;  Location: [facility];  Service: Orthopedics;  Laterality: Left;  ? SHOULDER ARTHROSCOPY WITH OPEN ROTATOR CUFF REPAIR Right 12/22/2018  ? Procedure: SHOULDER ARTHROSCOPY WITH OPEN ROTATOR CUFF REPAIR;  Surgeon: Corky Mull, MD;  Location: [facility];  Service: Orthopedics;  Laterality: Right;  ? TOTAL HIP ARTHROPLASTY Left 09/27/2014  ? Procedure: TOTAL HIP ARTHROPLASTY ANTERIOR APPROACH;  Surgeon: Hessie Knows, MD;  Location: [facility];  Service: Orthopedics;  Laterality: Left;  ? TOTAL KNEE ARTHROPLASTY Left 12/01/2014  ? Procedure: TOTAL KNEE ARTHROPLASTY;  Surgeon: Hessie Knows, MD;  Location: [facility];  Service: Orthopedics;  Laterality: Left;  ? ? ? ?Medications:  ?Outpatient Encounter Medications as of 07/02/2021  ?Medication Sig  ? allopurinol (ZYLOPRIM) 300 MG tablet Take 300 mg by mouth daily.  ? EPINEPHrine 0.3 mg/0.3 mL IJ SOAJ injection Inject 0.3 mLs (0.3 mg total) into the muscle as needed for anaphylaxis.  ? furosemide (LASIX) 40 MG  tablet TAKE 1 TABLET(40 MG) BY MOUTH DAILY  ? gabapentin (NEURONTIN) 100 MG capsule Take 100 mg in the morning and 1800 mg in the evening  ? gabapentin (NEURONTIN) 300 MG capsule Take 300 mg by mouth daily.  ? levothyroxine (SYNTHROID) 137 MCG tablet Take 1 tablet (137 mcg total) by mouth daily.  ? pantoprazole (PROTONIX) 40 MG tablet Take 1 tablet (40 mg total) by mouth daily.  ? potassium chloride SA (KLOR-CON M) 20 MEQ tablet TAKE 2 TABLETS BY MOUTH EVERY MORNING AND 1 TABLET EVERY EVENING  ? sertraline (ZOLOFT) 50 MG tablet TAKE 1 TABLET(50 MG) BY MOUTH DAILY  ? triamterene-hydrochlorothiazide (MAXZIDE-25) 37.5-25 MG tablet Take 1 tablet by mouth daily.  ? ?Facility-Administered Encounter Medications as of 07/02/2021  ?Medication  ? 0.9 %  sodium chloride infusion  ? cyanocobalamin ((VITAMIN B-12)) injection 1,000 mcg  ? ? ?Allergies:  ?Allergies  ?Allergen Reactions  ? Contrast  Media [Iodinated Contrast Media] Anaphylaxis  ?  MRI dye  ? Etodolac Anaphylaxis  ? Alpha-Gal   ?  Flu- like symptoms  ? Influenza Vaccines Hives  ? Lactose Nausea And Vomiting  ?  Can have yogurt ?Can not have m

## 2021-07-02 NOTE — Patient Instructions (Signed)
Start Cymbalta '30mg'$  at bedtime ? ?Stop gabapentin '100mg'$   ? ?Continue gabapentin '1800mg'$  at bedtime ? ?Start physical therapy for right leg pain ? ?Return to clinic in 9 months ?

## 2021-07-16 ENCOUNTER — Ambulatory Visit (AMBULATORY_SURGERY_CENTER): Payer: 59 | Admitting: Gastroenterology

## 2021-07-16 ENCOUNTER — Encounter: Payer: Self-pay | Admitting: Gastroenterology

## 2021-07-16 VITALS — BP 120/69 | HR 66 | Temp 97.3°F | Resp 11 | Ht 68.0 in | Wt 243.0 lb

## 2021-07-16 DIAGNOSIS — K219 Gastro-esophageal reflux disease without esophagitis: Secondary | ICD-10-CM

## 2021-07-16 DIAGNOSIS — D123 Benign neoplasm of transverse colon: Secondary | ICD-10-CM

## 2021-07-16 DIAGNOSIS — K2270 Barrett's esophagus without dysplasia: Secondary | ICD-10-CM

## 2021-07-16 DIAGNOSIS — D124 Benign neoplasm of descending colon: Secondary | ICD-10-CM

## 2021-07-16 DIAGNOSIS — K317 Polyp of stomach and duodenum: Secondary | ICD-10-CM | POA: Diagnosis not present

## 2021-07-16 DIAGNOSIS — D125 Benign neoplasm of sigmoid colon: Secondary | ICD-10-CM

## 2021-07-16 DIAGNOSIS — Z86010 Personal history of colonic polyps: Secondary | ICD-10-CM | POA: Diagnosis not present

## 2021-07-16 MED ORDER — SODIUM CHLORIDE 0.9 % IV SOLN: 500.0000 mL | Freq: Once | INTRAVENOUS | Status: DC

## 2021-07-16 NOTE — Op Note (Signed)
[place] ?Patient Name: Lori Le ?Procedure Date: 07/16/2021 9:52 AM ?MRN: [id number] ?Endoscopist: Lover E. Candis Schatz , MD ?Age: 56 ?Referring MD:  ?Date of Birth: 1965/12/30 ?Gender: Female ?Account #: [id number] ?Procedure:                Upper GI endoscopy ?Indications:              Epigastric abdominal pain, Esophageal reflux,  ?                          reported history of Barrett's ?Medicines:                Monitored Anesthesia Care ?Procedure:                Pre-Anesthesia Assessment: ?                          - Prior to the procedure, a History and Physical  ?                          was performed, and patient medications and  ?                          allergies were reviewed. The patient's tolerance of  ?                          previous anesthesia was also reviewed. The risks  ?                          and benefits of the procedure and the sedation  ?                          options and risks were discussed with the patient.  ?                          All questions were answered, and informed consent  ?                          was obtained. Prior Anticoagulants: The patient has  ?                          taken no previous anticoagulant or antiplatelet  ?                          agents. ASA Grade Assessment: II - A patient with  ?                          mild systemic disease. After reviewing the risks  ?                          and benefits, the patient was deemed in  ?                          satisfactory condition to undergo the procedure. ?  After obtaining informed consent, the endoscope was  ?                          passed under direct vision. Throughout the  ?                          procedure, the patient's blood pressure, pulse, and  ?                          oxygen saturations were monitored continuously. The  ?                          Endoscope was introduced through the mouth, and  ?                          advanced to the efferent  jejunal loop. The upper GI  ?                          endoscopy was accomplished without difficulty. The  ?                          patient tolerated the procedure well. ?Scope In: ?Scope Out: ?Findings:                 The Z-line was irregular. ?                          The exam of the esophagus was otherwise normal. ?                          Evidence of a gastric bypass was found. A gastric  ?                          pouch with a large size was found. The  ?                          gastrojejunal anastomosis was characterized by  ?                          erythema. This was traversed. ?                          Diffuse mildly erythematous mucosa without bleeding  ?                          was found in the gastric body. Biopsies were taken  ?                          with a cold forceps for Helicobacter pylori  ?                          testing. Estimated blood loss was minimal. ?                          The examined jejunum was  normal. ?Complications:            No immediate complications. ?Estimated Blood Loss:     Estimated blood loss was minimal. ?Impression:               - Z-line irregular. No evidence of Barrett's  ?                          esophagus. ?                          - Gastric bypass with a large-sized pouch.  ?                          Gastrojejunal anastomosis characterized by  ?                          erythema. No evidence of ulcer/marginal ulcer ?                          - Erythematous mucosa in the gastric body. Biopsied. ?                          - Normal examined jejunum. ?Recommendation:           - Patient has a contact number available for  ?                          emergencies. The signs and symptoms of potential  ?                          delayed complications were discussed with the  ?                          patient. Return to normal activities tomorrow.  ?                          Written discharge instructions were provided to the  ?                           patient. ?                          - Resume previous diet. ?                          - Continue present medications. ?                          - Await pathology results. ?                          - Recommend against any further Barrett's  ?                          screening/surveillance ?Reposa E. Candis Schatz, MD ?07/16/2021 10:50:26 AM ?This report has been signed electronically. ?

## 2021-07-16 NOTE — Patient Instructions (Signed)
Discharge instructions given. ?Handout on polyps. ?Biopsies taken. ?Resume previous medications. ?YOU HAD AN ENDOSCOPIC PROCEDURE TODAY AT [facility]:   Refer to the procedure report that was given to you for any specific questions about what was found during the examination.  If the procedure report does not answer your questions, please call your gastroenterologist to clarify.  If you requested that your care partner not be given the details of your procedure findings, then the procedure report has been included in a sealed envelope for you to review at your convenience later. ? ?YOU SHOULD EXPECT: Some feelings of bloating in the abdomen. Passage of more gas than usual.  Walking can help get rid of the air that was put into your GI tract during the procedure and reduce the bloating. If you had a lower endoscopy (such as a colonoscopy or flexible sigmoidoscopy) you may notice spotting of blood in your stool or on the toilet paper. If you underwent a bowel prep for your procedure, you may not have a normal bowel movement for a few days. ? ?Please Note:  You might notice some irritation and congestion in your nose or some drainage.  This is from the oxygen used during your procedure.  There is no need for concern and it should clear up in a day or so. ? ?SYMPTOMS TO REPORT IMMEDIATELY: ? ?Following lower endoscopy (colonoscopy or flexible sigmoidoscopy): ? Excessive amounts of blood in the stool ? Significant tenderness or worsening of abdominal pains ? Swelling of the abdomen that is new, acute ? Fever of 100?F or higher ? ?Following upper endoscopy (EGD) ? Vomiting of blood or coffee ground material ? New chest pain or pain under the shoulder blades ? Painful or persistently difficult swallowing ? New shortness of breath ? Fever of 100?F or higher ? Black, tarry-looking stools ? ?For urgent or emergent issues, a gastroenterologist can be reached at any hour by calling [phone]. ?Do not use  MyChart messaging for urgent concerns.  ? ? ?DIET:  We do recommend a small meal at first, but then you may proceed to your regular diet.  Drink plenty of fluids but you should avoid alcoholic beverages for 24 hours. ? ?ACTIVITY:  You should plan to take it easy for the rest of today and you should NOT DRIVE or use heavy machinery until tomorrow (because of the sedation medicines used during the test).   ? ?FOLLOW UP: ?Our staff will call the number listed on your records 48-72 hours following your procedure to check on you and address any questions or concerns that you may have regarding the information given to you following your procedure. If we do not reach you, we will leave a message.  We will attempt to reach you two times.  During this call, we will ask if you have developed any symptoms of COVID 19. If you develop any symptoms (ie: fever, flu-like symptoms, shortness of breath, cough etc.) before then, please call [phone].  If you test positive for Covid 19 in the 2 weeks post procedure, please call and report this information to [name].   ? ?If any biopsies were taken you will be contacted by phone or by letter within the next 1-3 weeks.  Please call us at [phone] if you have not heard about the biopsies in 3 weeks.  ? ? ?SIGNATURES/CONFIDENTIALITY: ?You and/or your care partner have signed paperwork which will be entered into your electronic medical record.  These signatures attest  to the fact that that the information above on your After Visit Summary has been reviewed and is understood.  Full responsibility of the confidentiality of this discharge information lies with you and/or your care-partner.  ?

## 2021-07-16 NOTE — Progress Notes (Signed)
Called to room to assist during endoscopic procedure.  Patient ID and intended procedure confirmed with present staff. Received instructions for my participation in the procedure from the performing physician.  

## 2021-07-16 NOTE — Progress Notes (Signed)
[facility] History and Physical ? ? ?Primary Care Physician:  Einar Pheasant, MD ? ? ?Reason for Procedure:   Polyp surveillance, GERD ? ?Plan:    Surveillance colonoscopy, EGD ? ? ? ? ?HPI: Lori Le is a 56 y.o. female status post RNY gastric bypass with a reported history of ulcers and GERD with recurrent abdominal pain.  Also with questionable Barrett's history.  She had a colonoscopy in Feb 2020 in which 4 polyps were removed in including a 10 mm cecal polyp and she was recommended to repeat in 3 years.  EGD in June 2020 with endoscopic findings suspicious for Barrett's, but pathology showing no intestinal metaplasia (chronic inflammation only).  Her GERD symptoms and abdominal pain have improved since restarting Protonix.  No family history of colon cancer  ? ? ?Past Medical History:  ?Diagnosis Date  ? Allergic rhinitis due to allergen   ? Allergy   ? Anemia   ? Anxiety   ? Arthritis   ? B12 deficiency   ? Cancer [facility])   ? skin ca-melanoma  ? Chronic idiopathic urticaria   ? Complication of anesthesia   ? nausea, slow to wake up  ? Depression   ? Edema   ? Family history of adverse reaction to anesthesia   ? Father - PONV  ? Gastric ulcer   ? GERD (gastroesophageal reflux disease)   ? Gout   ? Gout   ? Hashimoto's thyroiditis   ? Heart murmur   ? History of eating disorder   ? History of kidney stones   ? h/o years ago  ? Hives   ? Hyperlipidemia   ? Hypertension   ? Hypothyroidism   ? Lower extremity edema   ? Melanoma in situ [facility])   ? left shoulder  ? Migraine   ? approx 4x/yr-migraines  ? Mitral valve disorder   ? Motion sickness   ? all moving vehicles  ? Neuropathy   ? fingers and feet  ? PCOS (polycystic ovarian syndrome)   ? PONV (postoperative nausea and vomiting)   ? Psoriatic arthritis ([place])   ? Thyroid disease   ? Ulcer   ? Vertigo   ? last episode over 1 yr ago  ? ? ?Past Surgical History:  ?Procedure Laterality Date  ? BONE EXCISION Left 09/03/2018  ? Procedure: PART EXCISION  BONE-PHALANX 2,3,4 LEFT;  Surgeon: Albertine Patricia, DPM;  Location: [place];  Service: Podiatry;  Laterality: Left;  ? BREAST BIOPSY Right 2007  ? neg- bx/clip  ? CHOLECYSTECTOMY  2003  ? COLONOSCOPY WITH PROPOFOL N/A 05/11/2018  ? Procedure: COLONOSCOPY WITH PROPOFOL;  Surgeon: [name], MD;  Location: [facility] ENDOSCOPY;  Service: Endoscopy;  Laterality: N/A;  ? DILITATION & CURRETTAGE/HYSTROSCOPY WITH NOVASURE ABLATION N/A 08/19/2016  ? Procedure: DILATATION & CURETTAGE/HYSTEROSCOPY WITH NOVASURE ABLATION;  Surgeon: [name], MD;  Location: [facility] ORS;  Service: Gynecology;  Laterality: N/A;  ? ESOPHAGOGASTRODUODENOSCOPY (EGD) WITH PROPOFOL N/A 12/05/2015  ? Procedure: ESOPHAGOGASTRODUODENOSCOPY (EGD) WITH PROPOFOL;  Surgeon: Lollie Sails, MD;  Location: [facility] ENDOSCOPY;  Service: Endoscopy;  Laterality: N/A;  ? ESOPHAGOGASTRODUODENOSCOPY (EGD) WITH PROPOFOL N/A 02/06/2016  ? Procedure: ESOPHAGOGASTRODUODENOSCOPY (EGD) WITH PROPOFOL;  Surgeon: Lollie Sails, MD;  Location: [facility] ENDOSCOPY;  Service: Endoscopy;  Laterality: N/A;  ? ESOPHAGOGASTRODUODENOSCOPY (EGD) WITH PROPOFOL N/A 05/11/2018  ? Procedure: ESOPHAGOGASTRODUODENOSCOPY (EGD) WITH PROPOFOL;  Surgeon: [name], MD;  Location: [facility] ENDOSCOPY;  Service: Endoscopy;  Laterality: N/A;  ? ESOPHAGOGASTRODUODENOSCOPY (EGD) WITH  PROPOFOL N/A 09/08/2018  ? Procedure: ESOPHAGOGASTRODUODENOSCOPY (EGD) WITH BIOPSY;  Surgeon: [name], MD;  Location: [place];  Service: Endoscopy;  Laterality: N/A;  ? FOOT ARTHRODESIS Left 09/03/2018  ? Procedure: ARTHRODESIS; HALLUX/IP JOINT LEFT;  Surgeon: Albertine Patricia, DPM;  Location: [place];  Service: Podiatry;  Laterality: Left;  [facility] LOCAL  ? FOOT SURGERY  1994  ? GASTRIC BYPASS  2011  ? JOINT REPLACEMENT Left 09/27/2014  ? HIP  ? JOINT REPLACEMENT Left 12/01/2014  ? KNEE  ? KNEE ARTHROSCOPY Right   ? KNEE ARTHROSCOPY Left 03/23/2015  ?  Procedure: ARTHROSCOPY KNEE, partial synovectomy;  Surgeon: Hessie Knows, MD;  Location: [facility] ORS;  Service: Orthopedics;  Laterality: Left;  ? LAPAROSCOPIC GASTRIC BANDING  2010  ? POLYPECTOMY N/A 09/08/2018  ? Procedure: POLYPECTOMY;  Surgeon: [name], MD;  Location: [place];  Service: Endoscopy;  Laterality: N/A;  Stomach  ? ROTATOR CUFF REPAIR Bilateral   ? right shoulder 07-26-2016  ? SHOULDER ARTHROSCOPY Left 06/20/2015  ? Procedure: ARTHROSCOPY SHOULDER, REPAIR OF MASSIVE ROTATOR CUFF TEAR, TENODESIS, DECOMPRESSION DEBRIDEMENT;  Surgeon: Corky Mull, MD;  Location: [facility] ORS;  Service: Orthopedics;  Laterality: Left;  ? SHOULDER ARTHROSCOPY WITH OPEN ROTATOR CUFF REPAIR Right 12/22/2018  ? Procedure: SHOULDER ARTHROSCOPY WITH OPEN ROTATOR CUFF REPAIR;  Surgeon: Corky Mull, MD;  Location: [facility] ORS;  Service: Orthopedics;  Laterality: Right;  ? TOTAL HIP ARTHROPLASTY Left 09/27/2014  ? Procedure: TOTAL HIP ARTHROPLASTY ANTERIOR APPROACH;  Surgeon: Hessie Knows, MD;  Location: [facility];  Service: Orthopedics;  Laterality: Left;  ? TOTAL KNEE ARTHROPLASTY Left 12/01/2014  ? Procedure: TOTAL KNEE ARTHROPLASTY;  Surgeon: Hessie Knows, MD;  Location: [facility] ORS;  Service: Orthopedics;  Laterality: Left;  ? ? ?Prior to Admission medications   ?Medication Sig Start Date End Date Taking? Authorizing Provider  ?allopurinol (ZYLOPRIM) 300 MG tablet Take 300 mg by mouth daily. 02/16/20  Yes [provider]  ?DULoxetine (CYMBALTA) 30 MG capsule Take 1 capsule (30 mg total) by mouth daily. 07/02/21  Yes Patel, Donika K, DO  ?furosemide (LASIX) 40 MG tablet TAKE 1 TABLET(40 MG) BY MOUTH DAILY 05/12/21  Yes Einar Pheasant, MD  ?gabapentin (NEURONTIN) 300 MG capsule Take 6 capsules (1,800 mg total) by mouth daily. 07/02/21  Yes Patel, Arvin Collard K, DO  ?levothyroxine (SYNTHROID) 137 MCG tablet Take 1 tablet (137 mcg total) by mouth daily. 04/30/21  Yes Philemon Kingdom, MD  ?pantoprazole (PROTONIX) 40  MG tablet Take 1 tablet (40 mg total) by mouth daily. 05/31/21  Yes Levin Erp, PA  ?potassium chloride SA (KLOR-CON M) 20 MEQ tablet TAKE 2 TABLETS BY MOUTH EVERY MORNING AND 1 TABLET EVERY EVENING 04/06/21  Yes Einar Pheasant, MD  ?sertraline (ZOLOFT) 50 MG tablet TAKE 1 TABLET(50 MG) BY MOUTH DAILY 03/01/21  Yes Einar Pheasant, MD  ?triamterene-hydrochlorothiazide (MAXZIDE-25) 37.5-25 MG tablet Take 1 tablet by mouth daily. 05/12/21  Yes Einar Pheasant, MD  ?EPINEPHrine 0.3 mg/0.3 mL IJ SOAJ injection Inject 0.3 mLs (0.3 mg total) into the muscle as needed for anaphylaxis. 12/31/18   Laqueta Linden, MD  ? ? ?Current Outpatient Medications  ?Medication Sig Dispense Refill  ? allopurinol (ZYLOPRIM) 300 MG tablet Take 300 mg by mouth daily.    ? DULoxetine (CYMBALTA) 30 MG capsule Take 1 capsule (30 mg total) by mouth daily. 90 capsule 3  ? furosemide (LASIX) 40 MG tablet TAKE 1 TABLET(40 MG) BY MOUTH DAILY 90 tablet 1  ?  gabapentin (NEURONTIN) 300 MG capsule Take 6 capsules (1,800 mg total) by mouth daily. 540 capsule 3  ? levothyroxine (SYNTHROID) 137 MCG tablet Take 1 tablet (137 mcg total) by mouth daily. 90 tablet 3  ? pantoprazole (PROTONIX) 40 MG tablet Take 1 tablet (40 mg total) by mouth daily. 90 tablet 3  ? potassium chloride SA (KLOR-CON M) 20 MEQ tablet TAKE 2 TABLETS BY MOUTH EVERY MORNING AND 1 TABLET EVERY EVENING 270 tablet 1  ? sertraline (ZOLOFT) 50 MG tablet TAKE 1 TABLET(50 MG) BY MOUTH DAILY 90 tablet 1  ? triamterene-hydrochlorothiazide (MAXZIDE-25) 37.5-25 MG tablet Take 1 tablet by mouth daily. 90 tablet 1  ? EPINEPHrine 0.3 mg/0.3 mL IJ SOAJ injection Inject 0.3 mLs (0.3 mg total) into the muscle as needed for anaphylaxis. 1 each 0  ? ?Current Facility-Administered Medications  ?Medication Dose Route Frequency Provider Last Rate Last Admin  ? 0.9 %  sodium chloride infusion  500 mL Intravenous Once Daryel November, MD      ? ?Facility-Administered Medications Ordered in  Other Visits  ?Medication Dose Route Frequency Provider Last Rate Last Admin  ? 0.9 %  sodium chloride infusion   Intravenous Continuous Lequita Asal, MD 0 mL/hr at 05/08/17 1437 New Bag at 05/11/18 1437

## 2021-07-16 NOTE — Progress Notes (Signed)
PT taken to PACU. Monitors in place. VSS. Report given to RN. 

## 2021-07-16 NOTE — Op Note (Signed)
[facility] ?Patient Name: Lori Le ?Procedure Date: 07/16/2021 9:45 AM ?MRN: [id number] ?Endoscopist: Petrosky E. Candis Schatz , MD ?Age: 56 ?Referring MD:  ?Date of Birth: 05/04/1965 ?Gender: Female ?Account #: [id number] ?Procedure:                Colonoscopy ?Indications:              Surveillance: Personal history of adenomatous  ?                          polyps on last colonoscopy 3 years ago ?Medicines:                Monitored Anesthesia Care ?Procedure:                Pre-Anesthesia Assessment: ?                          - Prior to the procedure, a History and Physical  ?                          was performed, and patient medications and  ?                          allergies were reviewed. The patient's tolerance of  ?                          previous anesthesia was also reviewed. The risks  ?                          and benefits of the procedure and the sedation  ?                          options and risks were discussed with the patient.  ?                          All questions were answered, and informed consent  ?                          was obtained. Prior Anticoagulants: The patient has  ?                          taken no previous anticoagulant or antiplatelet  ?                          agents. ASA Grade Assessment: II - A patient with  ?                          mild systemic disease. After reviewing the risks  ?                          and benefits, the patient was deemed in  ?                          satisfactory condition to undergo the procedure. ?  After obtaining informed consent, the colonoscope  ?                          was passed under direct vision. Throughout the  ?                          procedure, the patient's blood pressure, pulse, and  ?                          oxygen saturations were monitored continuously. The  ?                          CF [id number] #[id number] was introduced through the anus  ?                          and advanced to the  the terminal ileum, with  ?                          identification of the appendiceal orifice and IC  ?                          valve. The colonoscopy was somewhat difficult due  ?                          to significant looping. Successful completion of  ?                          the procedure was aided by using manual pressure.  ?                          The patient tolerated the procedure well. The  ?                          quality of the bowel preparation was adequate. The  ?                          terminal ileum, ileocecal valve, appendiceal  ?                          orifice, and rectum were photographed. The bowel  ?                          preparation used was 2-day Miralax via split dose  ?                          instruction. ?Scope In: 10:10:54 AM ?Scope Out: 10:43:01 AM ?Scope Withdrawal Time: 0 hours 22 minutes 14 seconds  ?Total Procedure Duration: 0 hours 32 minutes 7 seconds  ?Findings:                 The perianal and digital rectal examinations were  ?                          normal. Pertinent negatives include normal  ?  sphincter tone and no palpable rectal lesions. ?                          A 5 mm polyp was found in the transverse colon. The  ?                          polyp was sessile. The polyp was removed with a  ?                          cold snare. Resection and retrieval were complete.  ?                          Estimated blood loss was minimal. ?                          A 4 mm polyp was found in the descending colon. The  ?                          polyp was sessile. The polyp was removed with a  ?                          cold snare. Resection and retrieval were complete.  ?                          Estimated blood loss was minimal. ?                          A 7 mm polyp was found in the sigmoid colon. The  ?                          polyp was pedunculated. The polyp was removed with  ?                          a cold snare. Resection and  retrieval were  ?                          complete. Estimated blood loss was minimal. ?                          The exam was otherwise normal throughout the  ?                          examined colon. ?                          The retroflexed view of the distal rectum and anal  ?                          verge was normal and showed no anal or rectal  ?                          abnormalities. ?Complications:            No immediate  complications. ?Estimated Blood Loss:     Estimated blood loss was minimal. ?Impression:               - One 5 mm polyp in the transverse colon, removed  ?                          with a cold snare. Resected and retrieved. ?                          - One 4 mm polyp in the descending colon, removed  ?                          with a cold snare. Resected and retrieved. ?                          - One 7 mm polyp in the sigmoid colon, removed with  ?                          a cold snare. Resected and retrieved. ?                          - The distal rectum and anal verge are normal on  ?                          retroflexion view. ?Recommendation:           - Patient has a contact number available for  ?                          emergencies. The signs and symptoms of potential  ?                          delayed complications were discussed with the  ?                          patient. Return to normal activities tomorrow.  ?                          Written discharge instructions were provided to the  ?                          patient. ?                          - Resume previous diet. ?                          - Continue present medications. ?                          - Await pathology results. ?                          - Repeat colonoscopy (date not yet determined) for  ?  surveillance based on pathology results. ?Harkins E. Candis Schatz, MD ?07/16/2021 10:56:01 AM ?This report has been signed electronically. ?

## 2021-07-16 NOTE — Progress Notes (Signed)
VS-CW  Pt's states no medical or surgical changes since previsit or office visit.  

## 2021-07-18 ENCOUNTER — Telehealth: Payer: Self-pay

## 2021-07-18 NOTE — Telephone Encounter (Signed)
?  Follow up Call- ? ? ?  07/16/2021  ?  8:24 AM  ?Call back number  ?Post procedure Call Back phone  # [phone]  ?Permission to leave phone message Yes  ?  ? ?Patient questions: ? ?Do you have a fever, pain , or abdominal swelling? No. ?Pain Score  0 * ? ?Have you tolerated food without any problems? Yes.   ? ?Have you been able to return to your normal activities? Yes.   ? ?Do you have any questions about your discharge instructions: ?Diet   No. ?Medications  No. ?Follow up visit  No. ? ?Do you have questions or concerns about your Care? No. ? ?Actions: ?* If pain score is 4 or above: ?No action needed, pain <4. ? ? ?

## 2021-07-19 NOTE — Progress Notes (Signed)
Lori Le,  ?The biopsies taken from your stomach were notable for mild reactive gastropathy which is a common finding and often related to use of certain medications (often NSAIDs), but there was no evidence of Helicobacter pylori infection. This common finding is not felt to necessarily be a cause of any particular symptom and there is no specific treatment or further evaluation recommended. ? ?The three polyps that I removed during your recent procedure were completely benign but were proven to be "pre-cancerous" polyps that MAY have grown into cancers if they had not been removed.  Studies shows that at least 20% of women over age 28 and 30% of men over age 43 have pre-cancerous polyps.   ?Based on current nationally recognized surveillance guidelines, I recommend that you have a repeat colonoscopy in 5 years.  ? ?If you develop any new rectal bleeding, abdominal pain or significant bowel habit changes, please contact me before then. ? ?

## 2021-09-10 ENCOUNTER — Other Ambulatory Visit: Payer: Self-pay

## 2021-09-10 ENCOUNTER — Encounter: Payer: Self-pay | Admitting: Internal Medicine

## 2021-09-10 MED ORDER — POTASSIUM CHLORIDE CRYS ER 20 MEQ PO TBCR: EXTENDED_RELEASE_TABLET | ORAL | 1 refills | Status: DC

## 2021-09-11 ENCOUNTER — Encounter: Payer: Self-pay | Admitting: Internal Medicine

## 2021-09-11 ENCOUNTER — Ambulatory Visit (INDEPENDENT_AMBULATORY_CARE_PROVIDER_SITE_OTHER): Payer: 59 | Admitting: Internal Medicine

## 2021-09-11 VITALS — BP 124/72 | HR 68 | Temp 98.3°F | Resp 18 | Ht 68.0 in | Wt 238.2 lb

## 2021-09-11 DIAGNOSIS — Z1231 Encounter for screening mammogram for malignant neoplasm of breast: Secondary | ICD-10-CM

## 2021-09-11 DIAGNOSIS — Z86010 Personal history of colonic polyps: Secondary | ICD-10-CM

## 2021-09-11 DIAGNOSIS — R739 Hyperglycemia, unspecified: Secondary | ICD-10-CM

## 2021-09-11 DIAGNOSIS — R911 Solitary pulmonary nodule: Secondary | ICD-10-CM

## 2021-09-11 DIAGNOSIS — R202 Paresthesia of skin: Secondary | ICD-10-CM

## 2021-09-11 DIAGNOSIS — D509 Iron deficiency anemia, unspecified: Secondary | ICD-10-CM

## 2021-09-11 DIAGNOSIS — F320 Major depressive disorder, single episode, mild: Secondary | ICD-10-CM

## 2021-09-11 DIAGNOSIS — E039 Hypothyroidism, unspecified: Secondary | ICD-10-CM | POA: Diagnosis not present

## 2021-09-11 DIAGNOSIS — I10 Essential (primary) hypertension: Secondary | ICD-10-CM

## 2021-09-11 DIAGNOSIS — R200 Anesthesia of skin: Secondary | ICD-10-CM

## 2021-09-11 DIAGNOSIS — K219 Gastro-esophageal reflux disease without esophagitis: Secondary | ICD-10-CM

## 2021-09-11 DIAGNOSIS — H938X1 Other specified disorders of right ear: Secondary | ICD-10-CM

## 2021-09-11 DIAGNOSIS — E538 Deficiency of other specified B group vitamins: Secondary | ICD-10-CM

## 2021-09-11 DIAGNOSIS — L4050 Arthropathic psoriasis, unspecified: Secondary | ICD-10-CM

## 2021-09-11 DIAGNOSIS — E7800 Pure hypercholesterolemia, unspecified: Secondary | ICD-10-CM

## 2021-09-11 DIAGNOSIS — G629 Polyneuropathy, unspecified: Secondary | ICD-10-CM

## 2021-09-11 LAB — HEPATIC FUNCTION PANEL
ALT: 14 U/L (ref 0–35)
AST: 10 U/L (ref 0–37)
Albumin: 4 g/dL (ref 3.5–5.2)
Alkaline Phosphatase: 72 U/L (ref 39–117)
Bilirubin, Direct: 0.1 mg/dL (ref 0.0–0.3)
Total Bilirubin: 0.5 mg/dL (ref 0.2–1.2)
Total Protein: 6.2 g/dL (ref 6.0–8.3)

## 2021-09-11 LAB — BASIC METABOLIC PANEL
BUN: 17 mg/dL (ref 6–23)
CO2: 34 mEq/L — ABNORMAL HIGH (ref 19–32)
Calcium: 9.3 mg/dL (ref 8.4–10.5)
Chloride: 97 mEq/L (ref 96–112)
Creatinine, Ser: 0.62 mg/dL (ref 0.40–1.20)
GFR: 100.09 mL/min (ref >60.00)
Glucose, Bld: 105 mg/dL — ABNORMAL HIGH (ref 70–99)
Potassium: 3.8 mEq/L (ref 3.5–5.1)
Sodium: 139 mEq/L (ref 135–145)

## 2021-09-11 LAB — LIPID PANEL
Cholesterol: 217 mg/dL — ABNORMAL HIGH (ref 0–200)
HDL: 50.9 mg/dL (ref >39.00)
NonHDL: 166.49
Total CHOL/HDL Ratio: 4
Triglycerides: 254 mg/dL — ABNORMAL HIGH (ref 0.0–149.0)
VLDL: 50.8 mg/dL — ABNORMAL HIGH (ref 0.0–40.0)

## 2021-09-11 LAB — CBC WITH DIFFERENTIAL/PLATELET
Basophils Absolute: 0 10*3/uL (ref 0.0–0.1)
Basophils Relative: 0.6 % (ref 0.0–3.0)
Eosinophils Absolute: 0.1 10*3/uL (ref 0.0–0.7)
Eosinophils Relative: 2 % (ref 0.0–5.0)
HCT: 41.4 % (ref 36.0–46.0)
Hemoglobin: 13.8 g/dL (ref 12.0–15.0)
Lymphocytes Relative: 30.5 % (ref 12.0–46.0)
Lymphs Abs: 1.7 10*3/uL (ref 0.7–4.0)
MCHC: 33.3 g/dL (ref 30.0–36.0)
MCV: 90.7 fl (ref 78.0–100.0)
Monocytes Absolute: 0.4 10*3/uL (ref 0.1–1.0)
Monocytes Relative: 6.2 % (ref 3.0–12.0)
Neutro Abs: 3.5 10*3/uL (ref 1.4–7.7)
Neutrophils Relative %: 60.7 % (ref 43.0–77.0)
Platelets: 235 10*3/uL (ref 150.0–400.0)
RBC: 4.56 Mil/uL (ref 3.87–5.11)
RDW: 13.2 % (ref 11.5–15.5)
WBC: 5.7 10*3/uL (ref 4.0–10.5)

## 2021-09-11 LAB — TSH: TSH: 0.89 u[IU]/mL (ref 0.35–5.50)

## 2021-09-11 LAB — IBC + FERRITIN
Ferritin: 25 ng/mL (ref 10.0–291.0)
Iron: 91 ug/dL (ref 42–145)
Saturation Ratios: 26.7 % (ref 20.0–50.0)
TIBC: 340.2 ug/dL (ref 250.0–450.0)
Transferrin: 243 mg/dL (ref 212.0–360.0)

## 2021-09-11 LAB — VITAMIN B12: Vitamin B-12: 313 pg/mL (ref 211–911)

## 2021-09-11 LAB — HEMOGLOBIN A1C: Hgb A1c MFr Bld: 5.8 % (ref 4.6–6.5)

## 2021-09-11 LAB — LDL CHOLESTEROL, DIRECT: Direct LDL: 146 mg/dL

## 2021-09-11 NOTE — Progress Notes (Signed)
Patient ID: SHERAY GRIST, female   DOB: 1965-10-09, 56 y.o.   MRN: [id number]   Subjective:    Patient ID: [name], female    DOB: [birth year], 56 y.o.   MRN: [id number]   Patient here for a scheduled follow up.     HPI Recently evaluated by neurology for numbness/tingling toes, feet, fingers and lower legs.  Previously evaluated by Dr Melrose Nakayama.  EMG - neuropathy involving the legs.  On gabapentin.  Seeing Dr Posey Pronto now.  Moved to [employer].  Recommended starting cymbalta and PT. She reports persistent pain despite above medications, stretches, etc.  Discussed referral to help with pain control.  No chest pain reported.  Breathing stable.  No nausea or vomiting reported.  Saw GI 05/2021.  Had colonoscopy and EGD 06/2021.    Past Medical History:  Diagnosis Date   Allergic rhinitis due to allergen    Allergy    Anemia    Anxiety    Arthritis    B12 deficiency    Cancer (HCC)    skin ca-melanoma   Chronic idiopathic urticaria    Complication of anesthesia    nausea, slow to wake up   Depression    Edema    Family history of adverse reaction to anesthesia    Father - PONV   Gastric ulcer    GERD (gastroesophageal reflux disease)    Gout    Gout    Hashimoto's thyroiditis    Heart murmur    History of eating disorder    History of kidney stones    h/o years ago   Hives    Hyperlipidemia    Hypertension    Hypothyroidism    Lower extremity edema    Melanoma in situ ([place])    left shoulder   Migraine    approx 4x/yr-migraines   Mitral valve disorder    Motion sickness    all moving vehicles   Neuropathy    fingers and feet   PCOS (polycystic ovarian syndrome)    PONV (postoperative nausea and vomiting)    Psoriatic arthritis (HCC)    Thyroid disease    Ulcer    Vertigo    last episode over 1 yr ago   Past Surgical History:  Procedure Laterality Date   BONE EXCISION Left 09/03/2018   Procedure: PART EXCISION BONE-PHALANX 2,3,4 LEFT;  Surgeon: Albertine Patricia, DPM;   Location: [place];  Service: Podiatry;  Laterality: Left;   BREAST BIOPSY Right 2007   neg- bx/clip   CHOLECYSTECTOMY  2003   COLONOSCOPY WITH PROPOFOL N/A 05/11/2018   Procedure: COLONOSCOPY WITH PROPOFOL;  Surgeon: [name], MD;  Location: [facility];  Service: Endoscopy;  Laterality: N/A;   DILITATION & CURRETTAGE/HYSTROSCOPY WITH NOVASURE ABLATION N/A 08/19/2016   Procedure: DILATATION & CURETTAGE/HYSTEROSCOPY WITH NOVASURE ABLATION;  Surgeon: [name], MD;  Location: [facility];  Service: Gynecology;  Laterality: N/A;   ESOPHAGOGASTRODUODENOSCOPY (EGD) WITH PROPOFOL N/A 12/05/2015   Procedure: ESOPHAGOGASTRODUODENOSCOPY (EGD) WITH PROPOFOL;  Surgeon: Lollie Sails, MD;  Location: [facility];  Service: Endoscopy;  Laterality: N/A;   ESOPHAGOGASTRODUODENOSCOPY (EGD) WITH PROPOFOL N/A 02/06/2016   Procedure: ESOPHAGOGASTRODUODENOSCOPY (EGD) WITH PROPOFOL;  Surgeon: Lollie Sails, MD;  Location: [facility] ENDOSCOPY;  Service: Endoscopy;  Laterality: N/A;   ESOPHAGOGASTRODUODENOSCOPY (EGD) WITH PROPOFOL N/A 05/11/2018   Procedure: ESOPHAGOGASTRODUODENOSCOPY (EGD) WITH PROPOFOL;  Surgeon: [name], MD;  Location: [facility];  Service: Endoscopy;  Laterality: N/A;   ESOPHAGOGASTRODUODENOSCOPY (EGD) WITH PROPOFOL  N/A 09/08/2018   Procedure: ESOPHAGOGASTRODUODENOSCOPY (EGD) WITH BIOPSY;  Surgeon: [name], MD;  Location: [place];  Service: Endoscopy;  Laterality: N/A;   FOOT ARTHRODESIS Left 09/03/2018   Procedure: ARTHRODESIS; HALLUX/IP JOINT LEFT;  Surgeon: Albertine Patricia, DPM;  Location: [place];  Service: Podiatry;  Laterality: Left;  [place]   GASTRIC BYPASS  2011   JOINT REPLACEMENT Left 09/27/2014   HIP   JOINT REPLACEMENT Left 12/01/2014   KNEE   KNEE ARTHROSCOPY Right    KNEE ARTHROSCOPY Left 03/23/2015   Procedure: ARTHROSCOPY KNEE, partial synovectomy;  Surgeon: Hessie Knows, MD;  Location: [facility];  Service: Orthopedics;  Laterality: Left;   LAPAROSCOPIC GASTRIC BANDING  2010   POLYPECTOMY N/A 09/08/2018   Procedure: POLYPECTOMY;  Surgeon: [name], MD;  Location: [place];  Service: Endoscopy;  Laterality: N/A;  Stomach   ROTATOR CUFF REPAIR Bilateral    right shoulder 07-26-2016   SHOULDER ARTHROSCOPY Left 06/20/2015   Procedure: ARTHROSCOPY SHOULDER, REPAIR OF MASSIVE ROTATOR CUFF TEAR, TENODESIS, DECOMPRESSION DEBRIDEMENT;  Surgeon: Corky Mull, MD;  Location: [facility];  Service: Orthopedics;  Laterality: Left;   SHOULDER ARTHROSCOPY WITH OPEN ROTATOR CUFF REPAIR Right 12/22/2018   Procedure: SHOULDER ARTHROSCOPY WITH OPEN ROTATOR CUFF REPAIR;  Surgeon: Corky Mull, MD;  Location: [facility];  Service: Orthopedics;  Laterality: Right;   TOTAL HIP ARTHROPLASTY Left 09/27/2014   Procedure: TOTAL HIP ARTHROPLASTY ANTERIOR APPROACH;  Surgeon: Hessie Knows, MD;  Location: [facility];  Service: Orthopedics;  Laterality: Left;   TOTAL KNEE ARTHROPLASTY Left 12/01/2014   Procedure: TOTAL KNEE ARTHROPLASTY;  Surgeon: Hessie Knows, MD;  Location: [facility];  Service: Orthopedics;  Laterality: Left;   Family History  Problem Relation Age of Onset   Arthritis Mother    Hyperlipidemia Mother    Hypertension Mother    Diabetes Mother    Sleep apnea Mother    Obesity Mother    Arthritis Father    Hyperlipidemia Father    Hypertension Father    Mental illness Father    Diabetes Father    Obesity Father    Sleep apnea Father    Bipolar disorder Father    Anxiety disorder Father    Depression Father    Cancer Father    Kidney disease Father    Arthritis Maternal Grandmother    Cancer Maternal Grandmother        breast cancer   Hyperlipidemia Maternal Grandmother    Hypertension Maternal Grandmother    Breast cancer Maternal Grandmother    Arthritis Maternal Grandfather    Hyperlipidemia Maternal Grandfather    Hypertension Maternal  Grandfather    Heart disease Maternal Grandfather        heart attack   Breast cancer Maternal Grandfather    Breast cancer Paternal Grandmother    Social History   Socioeconomic History   Marital status: Divorced    Spouse name: Not on file   Number of children: 0   Years of education: Not on file   Highest education level: Not on file  Occupational History   Occupation: Chiropractor  Tobacco Use   Smoking status: Never   Smokeless tobacco: Never  Vaping Use   Vaping Use: Never used  Substance and Sexual Activity   Alcohol use: Yes    Alcohol/week: 0.0 - 2.0 standard drinks of alcohol    Comment: social drinking   Drug use: No  Sexual activity: Not Currently    Partners: Male    Birth control/protection: Surgical    Comment: Ablation   Other Topics Concern   Not on file  Social History Narrative   Right Handed    Lives in a one story home with a attic and basement.    Social Determinants of Health   Financial Resource Strain: Not on file  Food Insecurity: Not on file  Transportation Needs: Not on file  Physical Activity: Not on file  Stress: Not on file  Social Connections: Not on file     Review of Systems  Constitutional:  Negative for appetite change and unexpected weight change.  HENT:  Negative for congestion and sinus pressure.   Respiratory:  Negative for cough, chest tightness and shortness of breath.   Cardiovascular:  Negative for chest pain and palpitations.       No increased swelling.   Gastrointestinal:  Negative for nausea and vomiting.       History of constipation.   Genitourinary:  Negative for difficulty urinating and dysuria.  Musculoskeletal:  Negative for myalgias.       Pain and numbness/tingling as outlined.   Skin:  Negative for color change and rash.  Neurological:  Negative for dizziness, light-headedness and headaches.  Psychiatric/Behavioral:  Negative for agitation and dysphoric mood.        Objective:     BP 124/72  (BP Location: Left Arm, Patient Position: Sitting, Cuff Size: Large)   Pulse 68   Temp 98.3 F (36.8 C) (Temporal)   Resp 18   Ht _0  (1.727 m)   Wt 238 lb 3.2 oz (108 kg)   SpO2 99%   BMI 36.22 kg/m  Wt Readings from Last 3 Encounters:  09/10/21 238 lb 3.2 oz (108 kg)  07/16/21 243 lb (110.2 kg)  07/02/21 239 lb (108.4 kg)    Physical Exam Vitals reviewed.  Constitutional:      General: She is not in acute distress.    Appearance: Normal appearance.  HENT:     Head: Normocephalic and atraumatic.     Right Ear: External ear normal.     Left Ear: External ear normal.  Eyes:     General: No scleral icterus.       Right eye: No discharge.        Left eye: No discharge.     Conjunctiva/sclera: Conjunctivae normal.  Neck:     Thyroid: No thyromegaly.  Cardiovascular:     Rate and Rhythm: Normal rate and regular rhythm.  Pulmonary:     Effort: No respiratory distress.     Breath sounds: Normal breath sounds. No wheezing.  Abdominal:     General: Bowel sounds are normal.     Palpations: Abdomen is soft.     Tenderness: There is no abdominal tenderness.  Musculoskeletal:        General: No swelling or tenderness.     Cervical back: Neck supple. No tenderness.  Lymphadenopathy:     Cervical: No cervical adenopathy.  Skin:    Findings: No erythema or rash.  Neurological:     Mental Status: She is alert.  Psychiatric:        Mood and Affect: Mood normal.        Behavior: Behavior normal.      Outpatient Encounter Medications as of 09/11/2021  Medication Sig   DULoxetine (CYMBALTA) 30 MG capsule Take 1 capsule (30 mg total) by mouth daily.   EPINEPHrine 0.3  mg/0.3 mL IJ SOAJ injection Inject 0.3 mLs (0.3 mg total) into the muscle as needed for anaphylaxis.   furosemide (LASIX) 40 MG tablet TAKE 1 TABLET(40 MG) BY MOUTH DAILY   gabapentin (NEURONTIN) 300 MG capsule Take 6 capsules (1,800 mg total) by mouth daily.   levothyroxine (SYNTHROID) 137 MCG tablet Take 1  tablet (137 mcg total) by mouth daily.   pantoprazole (PROTONIX) 40 MG tablet Take 1 tablet (40 mg total) by mouth daily.   potassium chloride SA (KLOR-CON M) 20 MEQ tablet TAKE 2 TABLETS BY MOUTH EVERY MORNING AND 1 TABLET EVERY EVENING   sertraline (ZOLOFT) 50 MG tablet TAKE 1 TABLET(50 MG) BY MOUTH DAILY   triamterene-hydrochlorothiazide (MAXZIDE-25) 37.5-25 MG tablet Take 1 tablet by mouth daily.   [DISCONTINUED] potassium chloride SA (KLOR-CON M) 20 MEQ tablet TAKE 2 TABLETS BY MOUTH EVERY MORNING AND 1 TABLET EVERY EVENING   [DISCONTINUED] allopurinol (ZYLOPRIM) 300 MG tablet Take 300 mg by mouth daily. (Patient not taking: Reported on 09/10/2021)   Facility-Administered Encounter Medications as of 09/11/2021  Medication   0.9 %  sodium chloride infusion   cyanocobalamin ((VITAMIN B-12)) injection 1,000 mcg     Lab Results  Component Value Date   WBC 5.7 09/11/2021   HGB 13.8 09/11/2021   HCT 41.4 09/11/2021   PLT 235.0 09/11/2021   GLUCOSE 105 (H) 09/11/2021   CHOL 217 (H) 09/11/2021   TRIG 254.0 (H) 09/11/2021   HDL 50.90 09/11/2021   LDLDIRECT 146.0 09/11/2021   LDLCALC 148 (H) 05/10/2019   ALT 14 09/11/2021   AST 10 09/11/2021   NA 139 09/11/2021   K 3.8 09/11/2021   CL 97 09/11/2021   CREATININE 0.62 09/11/2021   BUN 17 09/11/2021   CO2 34 (H) 09/11/2021   TSH 0.89 09/11/2021   INR 0.99 03/08/2016   HGBA1C 5.8 09/11/2021    CT Chest Wo Contrast  Result Date: 06/09/2021 CLINICAL DATA:  Abnormal chest radiograph.  Pulmonary nodule. EXAM: CT CHEST WITHOUT CONTRAST TECHNIQUE: Multidetector CT imaging of the chest was performed following the standard protocol without IV contrast. RADIATION DOSE REDUCTION: This exam was performed according to the departmental dose-optimization program which includes automated exposure control, adjustment of the mA and/or kV according to patient size and/or use of iterative reconstruction technique. COMPARISON:  None. FINDINGS: Evaluation  of this exam is limited in the absence of intravenous contrast. Cardiovascular: There is no cardiomegaly or pericardial effusion. The thoracic aorta and central pulmonary arteries are grossly unremarkable on this noncontrast CT. Mediastinum/Nodes: No hilar or mediastinal adenopathy. The esophagus is grossly unremarkable. No mediastinal fluid collection. Lungs/Pleura: The lungs are clear. There is no pleural effusion or pneumothorax. No discrete nodule identified. The central airways are patent. Upper Abdomen: Postsurgical changes no and gastric bypass. Right upper quadrant cholecystectomy clips. Musculoskeletal: Degenerative changes of the spine. No acute osseous pathology. Right shoulder arthroplasty. IMPRESSION: No acute intrathoracic pathology. No pulmonary nodule identified. Electronically Signed   By: Anner Crete M.D.   On: 06/09/2021 03:01       Assessment & Plan:   Problem List Items Addressed This Visit     Anemia    Follow cbc and iron studies.  Just had GI w/up as outlined.       Relevant Orders   CBC with Differential/Platelet (Completed)   IBC + Ferritin (Completed)   B12 deficiency    Check B12 level.       Relevant Orders   Vitamin B12 (Completed)  Bilateral hand numbness    Worked up by Dr Herschell Dimes - hematology - no evidence of MGUS or amyloid.  On gabapentin and cymbalta.  Recently evaluated by neurology.       Ear fullness, right   Essential hypertension, benign - Primary    On triam/hctz and takes lasix.  Have tried to decrease and has problems with increased swelling.  Blood pressure doing well.  Follow pressures.  Follow metabolic panel.       Relevant Orders   Hepatic function panel (Completed)   Basic Metabolic Panel (BMET) (Completed)   GERD (gastroesophageal reflux disease)    On protonix.  Symptoms controlled if takes regularly.  Saw GI.  Had f/u EGD 05/2021 -Z-line irregular. No evidence of Barrett's esophagus. Gastric bypass with a large-sized  pouch. Gastrojejunal anastomosis characterized by erythema. No evidence of ulcer/marginal ulcer Erythematous mucosa in the gastric body. Biopsied. Normal examined jejunum.       History of colon polyps    Colonoscopy 06/2021 - One 5 mm polyp in the transverse colon, removed with a cold snare. Resected and Retrieved. One 4 mm polyp in the descending colon, removed with a cold snare. Resected and Retrieved. One 7 mm polyp in the sigmoid colon, removed with a cold snare. Resected and retrieved. The distal rectum and anal verge are normal on retroflexion view.      Hypercholesterolemia    The 10-year ASCVD risk score (Arnett DK, et al., 2019) is: 2.9%   Values used to calculate the score:     Age: 74 years     Sex: Female     Is Non-Hispanic African American: No     Diabetic: No     Tobacco smoker: No     Systolic Blood Pressure: 643 mmHg     Is BP treated: Yes     HDL Cholesterol: 50.9 mg/dL     Total Cholesterol: 217 mg/dL  Low cholesterol diet and exercise.  Follow lipid panel.       Relevant Orders   Lipid Profile (Completed)   Hyperglycemia    Low carb diet and exercise.  Follow met b and a1c.       Relevant Orders   HgB A1c (Completed)   Hypothyroidism    On synthroid.  Seeing endocrinology - f/u thyroid nodules.  Follow tsh.  S/p biopsy 05/2021 - FINAL MICROSCOPIC DIAGNOSIS: Consistent with benign follicular nodule (Bethesda category II).  Planned f/u 01/2022.       Relevant Orders   TSH (Completed)   Major depressive disorder, single episode, mild (HCC)    Continue zoloft. Overall appears to be handling things relatively well.  Follow.       Neuropathy    Check B12 level.  Just saw neurology as outlined.  On cymbalta and gabapentin.  Persistent pain.  Discussed evaluation - Dr Sharlet Salina.       Relevant Orders   Ambulatory referral to Orthopedic Surgery   Numbness and tingling    W/up as outlined.  On gabapentin.  Recently started cymbalta.  Persistent issues.   Continue f/u with neurology.  Request referral to Dr Sharlet Salina - to help manage pain.       Relevant Orders   Ambulatory referral to Orthopedic Surgery   Psoriatic arthritis ([place])    Has been on cosentyx.  Varying joint pains.        Relevant Orders   Ambulatory referral to Orthopedic Surgery   Pulmonary nodule    Found incidentally on  CT scan (05/22/20). .  Follow up CT chest 05/2021 - no nodule identified.       Other Visit Diagnoses     Encounter for screening mammogram for malignant neoplasm of breast       Relevant Orders   MM 3D SCREEN BREAST BILATERAL        Einar Pheasant, MD

## 2021-09-13 ENCOUNTER — Telehealth: Payer: Self-pay

## 2021-09-13 NOTE — Telephone Encounter (Signed)
LMTCB for lab results.  

## 2021-09-13 NOTE — Telephone Encounter (Signed)
See result note.  

## 2021-09-13 NOTE — Telephone Encounter (Signed)
Patient returned office phone call for lab results. 

## 2021-09-19 ENCOUNTER — Encounter: Payer: Self-pay | Admitting: Internal Medicine

## 2021-09-19 DIAGNOSIS — H938X1 Other specified disorders of right ear: Secondary | ICD-10-CM | POA: Insufficient documentation

## 2021-09-19 NOTE — Assessment & Plan Note (Signed)
Worked up by Dr Herschell Dimes - hematology - no evidence of MGUS or amyloid.  On gabapentin and cymbalta.  Recently evaluated by neurology.

## 2021-09-19 NOTE — Assessment & Plan Note (Signed)
Found incidentally on CT scan (05/22/20). .  Follow up CT chest 05/2021 - no nodule identified.

## 2021-09-19 NOTE — Assessment & Plan Note (Signed)
Continue zoloft.  Overall appears to be handling things relatively well.  Follow.  

## 2021-09-19 NOTE — Assessment & Plan Note (Signed)
Check B12 level. 

## 2021-09-19 NOTE — Assessment & Plan Note (Signed)
Has been on cosentyx.  Varying joint pains.

## 2021-09-19 NOTE — Assessment & Plan Note (Signed)
The 10-year ASCVD risk score (Arnett DK, et al., 2019) is: 2.9%   Values used to calculate the score:     Age: 56 years     Sex: Female     Is Non-Hispanic African American: No     Diabetic: No     Tobacco smoker: No     Systolic Blood Pressure: 539 mmHg     Is BP treated: Yes     HDL Cholesterol: 50.9 mg/dL     Total Cholesterol: 217 mg/dL  Low cholesterol diet and exercise.  Follow lipid panel.

## 2021-09-19 NOTE — Assessment & Plan Note (Signed)
Colonoscopy 06/2021 - One 5 mm polyp in the transverse colon, removed with a cold snare. Resected and Retrieved. One 4 mm polyp in the descending colon, removed with a cold snare. Resected and Retrieved. One 7 mm polyp in the sigmoid colon, removed with a cold snare. Resected and retrieved. The distal rectum and anal verge are normal on retroflexion view.

## 2021-09-19 NOTE — Assessment & Plan Note (Signed)
Follow cbc and iron studies.  Just had GI w/up as outlined.

## 2021-09-19 NOTE — Assessment & Plan Note (Signed)
Low carb diet and exercise.  Follow met b and a1c.

## 2021-09-19 NOTE — Assessment & Plan Note (Deleted)
Ear irrigation.   

## 2021-09-19 NOTE — Assessment & Plan Note (Signed)
On synthroid.  Seeing endocrinology - f/u thyroid nodules.  Follow tsh.  S/p biopsy 05/2021 - FINAL MICROSCOPIC DIAGNOSIS: Consistent with benign follicular nodule (Bethesda category II).  Planned f/u 01/2022.

## 2021-09-19 NOTE — Assessment & Plan Note (Addendum)
On protonix.  Symptoms controlled if takes regularly.  Saw GI.  Had f/u EGD 05/2021 -Z-line irregular. No evidence of Barrett's esophagus. Gastric bypass with a large-sized pouch. Gastrojejunal anastomosis characterized by erythema. No evidence of ulcer/marginal ulcer Erythematous mucosa in the gastric body. Biopsied. Normal examined jejunum.

## 2021-09-19 NOTE — Assessment & Plan Note (Signed)
W/up as outlined.  On gabapentin.  Recently started cymbalta.  Persistent issues.  Continue f/u with neurology.  Request referral to Dr Sharlet Salina - to help manage pain.

## 2021-09-19 NOTE — Assessment & Plan Note (Signed)
On triam/hctz and takes lasix.  Have tried to decrease and has problems with increased swelling.  Blood pressure doing well.  Follow pressures.  Follow metabolic panel.

## 2021-09-19 NOTE — Assessment & Plan Note (Signed)
Check B12 level.  Just saw neurology as outlined.  On cymbalta and gabapentin.  Persistent pain.  Discussed evaluation - Dr Sharlet Salina.

## 2021-11-07 ENCOUNTER — Encounter (INDEPENDENT_AMBULATORY_CARE_PROVIDER_SITE_OTHER): Payer: Self-pay

## 2021-11-13 ENCOUNTER — Telehealth: Payer: Medicare Other | Admitting: Physician Assistant

## 2021-11-13 ENCOUNTER — Encounter: Payer: Self-pay | Admitting: Hematology and Oncology

## 2021-11-13 DIAGNOSIS — R21 Rash and other nonspecific skin eruption: Secondary | ICD-10-CM

## 2021-11-13 MED ORDER — PREDNISONE 10 MG (21) PO TBPK: ORAL_TABLET | Freq: Every day | ORAL | 0 refills | Status: AC

## 2021-11-13 MED ORDER — FAMOTIDINE 20 MG PO TABS: 20.0000 mg | ORAL_TABLET | Freq: Two times a day (BID) | ORAL | 0 refills | Status: AC

## 2021-11-13 NOTE — Progress Notes (Signed)
Ms. Lori, Le are scheduled for a virtual visit with your provider today.    Just as we do with appointments in the office, we must obtain your consent to participate.  Your consent will be active for this visit and any virtual visit you may have with one of our providers in the next 365 days.    If you have a MyChart account, I can also send a copy of this consent to you electronically.  All virtual visits are billed to your insurance company just like a traditional visit in the office.  As this is a virtual visit, video technology does not allow for your provider to perform a traditional examination.  This may limit your provider's ability to fully assess your condition.  If your provider identifies any concerns that need to be evaluated in person or the need to arrange testing such as labs, EKG, etc, we will make arrangements to do so.    Although advances in technology are sophisticated, we cannot ensure that it will always work on either your end or our end.  If the connection with a video visit is poor, we may have to switch to a telephone visit.  With either a video or telephone visit, we are not always able to ensure that we have a secure connection.   I need to obtain your verbal consent now.   Are you willing to proceed with your visit today?   Lori Le has provided verbal consent on 11/13/2021 for a virtual visit (video or telephone).   Rodney Booze, PA-C 11/13/2021  6:06 PM   Date:  11/13/2021   ID:  Lori Le, DOB [birth year], MRN [id number]  Patient Location: Home Provider Location: Home Office   Participants: Patient and Provider for Visit and Wrap up  Method of visit: Video  Location of Patient: Home Location of Provider: Home Office Consent was obtain for visit over the video. Services rendered by provider: Visit was performed via video  A video enabled telemedicine application was used and I verified that I am speaking with the correct person using two  identifiers.  PCP:  Einar Pheasant, MD   Chief Complaint:  hives  History of Present Illness:    Lori Le is a 56 y.o. female with history as stated below. Presents video telehealth for an acute care visit  Pt c/o hives. Reports increased stress with the death of a family member. Reports erythematous, itching bumps to the entire body. Has a h/o same. Has tried triamcinolone and benadryl with minimal relief.   Denies other sxs.  Past Medical, Surgical, Social History, Allergies, and Medications have been Reviewed.  Past Medical History:  Diagnosis Date   Allergic rhinitis due to allergen    Allergy    Anemia    Anxiety    Arthritis    B12 deficiency    Cancer (HCC)    skin ca-melanoma   Chronic idiopathic urticaria    Complication of anesthesia    nausea, slow to wake up   Depression    Edema    Family history of adverse reaction to anesthesia    Father - PONV   Gastric ulcer    GERD (gastroesophageal reflux disease)    Gout    Gout    Hashimoto's thyroiditis    Heart murmur    History of eating disorder    History of kidney stones    h/o years ago   Hives    Hyperlipidemia  Hypertension    Hypothyroidism    Lower extremity edema    Melanoma in situ ([place])    left shoulder   Migraine    approx 4x/yr-migraines   Mitral valve disorder    Motion sickness    all moving vehicles   Neuropathy    fingers and feet   PCOS (polycystic ovarian syndrome)    PONV (postoperative nausea and vomiting)    Psoriatic arthritis (HCC)    Thyroid disease    Ulcer    Vertigo    last episode over 1 yr ago    Current Meds  Medication Sig   famotidine (PEPCID) 20 MG tablet Take 1 tablet (20 mg total) by mouth 2 (two) times daily for 7 days.   predniSONE (STERAPRED UNI-PAK 21 TAB) 10 MG (21) TBPK tablet Take by mouth daily. Take 6 tabs by mouth daily  for 2 days, then 5 tabs for 2 days, then 4 tabs for 2 days, then 3 tabs for 2 days, 2 tabs for 2 days, then 1 tab by  mouth daily for 2 days     Allergies:   Contrast media [iodinated contrast media], Etodolac, Nsaids, Alpha-gal, Influenza vaccines, Lactose, Lactose intolerance (gi), Bacitracin-neomycin-polymyxin, Neomycin-bacitracin zn-polymyx, and Neosporin + pain relief max st [neomy-bacit-polymyx-pramoxine]   ROS See HPI for history of present illness.  Physical Exam Skin:    Findings: Rash (erythematous papules to the bilat anterior thighs) present.             MDM: urticarial rash, likely stress induced. Otc meds not helping. Rx for prednisone taper and pepcid given.  There are no diagnoses linked to this encounter.   Time:   Today, I have spent 6 minutes with the patient with telehealth technology discussing the above problems, reviewing the chart, previous notes, medications and orders.    Tests Ordered: No orders of the defined types were placed in this encounter.   Medication Changes: Meds ordered this encounter  Medications   predniSONE (STERAPRED UNI-PAK 21 TAB) 10 MG (21) TBPK tablet    Sig: Take by mouth daily. Take 6 tabs by mouth daily  for 2 days, then 5 tabs for 2 days, then 4 tabs for 2 days, then 3 tabs for 2 days, 2 tabs for 2 days, then 1 tab by mouth daily for 2 days    Dispense:  42 tablet    Refill:  0    Order Specific Question:   Supervising Provider    Answer:   Sabra Heck, BRIAN [[id number]]   famotidine (PEPCID) 20 MG tablet    Sig: Take 1 tablet (20 mg total) by mouth 2 (two) times daily for 7 days.    Dispense:  14 tablet    Refill:  0    Order Specific Question:   Supervising Provider    Answer:   Noemi Chapel [[id number]]     Disposition:  Follow up  Signed, Rodney Booze, PA-C  11/13/2021 6:06 PM

## 2021-11-13 NOTE — Patient Instructions (Signed)
[name], thank you for joining Rodney Booze, PA-C for today's virtual visit.  While this provider is not your primary care provider (PCP), if your PCP is located in our provider database this encounter information will be shared with them immediately following your visit.  Consent: (Patient) Lori Le provided verbal consent for this virtual visit at the beginning of the encounter.  Current Medications:  Current Outpatient Medications:    famotidine (PEPCID) 20 MG tablet, Take 1 tablet (20 mg total) by mouth 2 (two) times daily for 7 days., Disp: 14 tablet, Rfl: 0   predniSONE (STERAPRED UNI-PAK 21 TAB) 10 MG (21) TBPK tablet, Take by mouth daily. Take 6 tabs by mouth daily  for 2 days, then 5 tabs for 2 days, then 4 tabs for 2 days, then 3 tabs for 2 days, 2 tabs for 2 days, then 1 tab by mouth daily for 2 days, Disp: 42 tablet, Rfl: 0   DULoxetine (CYMBALTA) 30 MG capsule, Take 1 capsule (30 mg total) by mouth daily., Disp: 90 capsule, Rfl: 3   EPINEPHrine 0.3 mg/0.3 mL IJ SOAJ injection, Inject 0.3 mLs (0.3 mg total) into the muscle as needed for anaphylaxis., Disp: 1 each, Rfl: 0   furosemide (LASIX) 40 MG tablet, TAKE 1 TABLET(40 MG) BY MOUTH DAILY, Disp: 90 tablet, Rfl: 1   gabapentin (NEURONTIN) 300 MG capsule, Take 6 capsules (1,800 mg total) by mouth daily., Disp: 540 capsule, Rfl: 3   levothyroxine (SYNTHROID) 137 MCG tablet, Take 1 tablet (137 mcg total) by mouth daily., Disp: 90 tablet, Rfl: 3   pantoprazole (PROTONIX) 40 MG tablet, Take 1 tablet (40 mg total) by mouth daily., Disp: 90 tablet, Rfl: 3   potassium chloride SA (KLOR-CON M) 20 MEQ tablet, TAKE 2 TABLETS BY MOUTH EVERY MORNING AND 1 TABLET EVERY EVENING, Disp: 270 tablet, Rfl: 1   sertraline (ZOLOFT) 50 MG tablet, TAKE 1 TABLET(50 MG) BY MOUTH DAILY, Disp: 90 tablet, Rfl: 1   triamterene-hydrochlorothiazide (MAXZIDE-25) 37.5-25 MG tablet, Take 1 tablet by mouth daily., Disp: 90 tablet, Rfl: 1 No current  facility-administered medications for this visit.  Facility-Administered Medications Ordered in Other Visits:    0.9 %  sodium chloride infusion, , Intravenous, Continuous, Corcoran, Melissa C, MD, Last Rate: 0 mL/hr at 05/08/17 1437, New Bag at 05/11/18 1437   cyanocobalamin ((VITAMIN B-12)) injection 1,000 mcg, 1,000 mcg, Intramuscular, Once, Corcoran, Melissa C, MD   Medications ordered in this encounter:  Meds ordered this encounter  Medications   predniSONE (STERAPRED UNI-PAK 21 TAB) 10 MG (21) TBPK tablet    Sig: Take by mouth daily. Take 6 tabs by mouth daily  for 2 days, then 5 tabs for 2 days, then 4 tabs for 2 days, then 3 tabs for 2 days, 2 tabs for 2 days, then 1 tab by mouth daily for 2 days    Dispense:  42 tablet    Refill:  0    Order Specific Question:   Supervising Provider    Answer:   Sabra Heck, BRIAN [[id number]]   famotidine (PEPCID) 20 MG tablet    Sig: Take 1 tablet (20 mg total) by mouth 2 (two) times daily for 7 days.    Dispense:  14 tablet    Refill:  0    Order Specific Question:   Supervising Provider    Answer:   Sabra Heck, BRIAN [[id number]]     *If you need refills on other medications prior to your next appointment, please  contact your pharmacy*  Follow-Up: Call back or seek an in-person evaluation if the symptoms worsen or if the condition fails to improve as anticipated.  Other Instructions Take prednisone and pepcid as directed   Follow up with your regular doctor in 1 week for reassessment and seek care sooner if your symptoms worsen or fail to improve.   If you have been instructed to have an in-person evaluation today at a local Urgent Care facility, please use the link below. It will take you to a list of all of our available [facility], including address, phone number and hours of operation. Please do not delay care.  [facility]  If you or a family member do not have a primary care provider, use the link below to schedule a  visit and establish care. When you choose a [facility] primary care physician or advanced practice provider, you gain a long-term partner in health. Find a Primary Care Provider  Learn more about [facility]'s in-office and virtual care options: [facility]

## 2021-11-19 ENCOUNTER — Other Ambulatory Visit: Payer: Self-pay | Admitting: Internal Medicine

## 2021-11-26 ENCOUNTER — Encounter: Payer: Self-pay | Admitting: Hematology and Oncology

## 2021-12-01 ENCOUNTER — Other Ambulatory Visit: Payer: Self-pay | Admitting: Internal Medicine

## 2021-12-10 ENCOUNTER — Other Ambulatory Visit: Payer: Self-pay

## 2021-12-10 DIAGNOSIS — M1611 Unilateral primary osteoarthritis, right hip: Secondary | ICD-10-CM | POA: Diagnosis not present

## 2021-12-10 DIAGNOSIS — E042 Nontoxic multinodular goiter: Secondary | ICD-10-CM

## 2021-12-10 MED ORDER — LEVOTHYROXINE SODIUM 137 MCG PO TABS: 137.0000 ug | ORAL_TABLET | Freq: Every day | ORAL | 3 refills | Status: AC

## 2021-12-11 ENCOUNTER — Other Ambulatory Visit: Payer: Self-pay

## 2021-12-11 MED ORDER — SERTRALINE HCL 50 MG PO TABS: ORAL_TABLET | ORAL | 1 refills | Status: AC

## 2021-12-11 MED ORDER — FUROSEMIDE 40 MG PO TABS: ORAL_TABLET | ORAL | 1 refills | Status: AC

## 2021-12-12 ENCOUNTER — Encounter: Payer: Self-pay | Admitting: Internal Medicine

## 2021-12-17 ENCOUNTER — Other Ambulatory Visit: Payer: Self-pay

## 2021-12-17 ENCOUNTER — Encounter: Payer: Self-pay | Admitting: Internal Medicine

## 2021-12-17 ENCOUNTER — Encounter: Payer: Self-pay | Admitting: Neurology

## 2021-12-17 ENCOUNTER — Encounter: Payer: Self-pay | Admitting: Hematology and Oncology

## 2021-12-17 ENCOUNTER — Other Ambulatory Visit (HOSPITAL_COMMUNITY): Payer: Self-pay

## 2021-12-17 DIAGNOSIS — L4050 Arthropathic psoriasis, unspecified: Secondary | ICD-10-CM

## 2021-12-17 MED ORDER — GABAPENTIN 300 MG PO CAPS: 1800.0000 mg | ORAL_CAPSULE | Freq: Every day | ORAL | 1 refills | Status: AC

## 2021-12-17 MED ORDER — POTASSIUM CHLORIDE CRYS ER 20 MEQ PO TBCR: EXTENDED_RELEASE_TABLET | ORAL | 1 refills | Status: AC

## 2021-12-17 MED ORDER — TRIAMTERENE-HCTZ 37.5-25 MG PO TABS: 1.0000 | ORAL_TABLET | Freq: Every day | ORAL | 1 refills | Status: AC

## 2021-12-17 MED ORDER — PANTOPRAZOLE SODIUM 40 MG PO TBEC: DELAYED_RELEASE_TABLET | ORAL | 3 refills | Status: AC

## 2021-12-17 MED ORDER — DULOXETINE HCL 30 MG PO CPEP: 30.0000 mg | ORAL_CAPSULE | Freq: Every day | ORAL | 1 refills | Status: AC

## 2021-12-19 ENCOUNTER — Encounter: Payer: Self-pay | Admitting: Hematology and Oncology

## 2021-12-26 ENCOUNTER — Ambulatory Visit
Admission: RE | Admit: 2021-12-26 | Discharge: 2021-12-26 | Disposition: A | Discharge status: Home / Self Care | Payer: Medicare Other | Source: Ambulatory Visit | Attending: Internal Medicine | Admitting: Internal Medicine

## 2021-12-26 DIAGNOSIS — Z1231 Encounter for screening mammogram for malignant neoplasm of breast: Secondary | ICD-10-CM | POA: Insufficient documentation

## 2022-01-31 ENCOUNTER — Ambulatory Visit: Payer: 59 | Admitting: Internal Medicine

## 2022-02-08 DIAGNOSIS — J019 Acute sinusitis, unspecified: Secondary | ICD-10-CM | POA: Diagnosis not present

## 2022-02-28 ENCOUNTER — Ambulatory Visit: Payer: 59 | Admitting: Internal Medicine

## 2022-02-28 ENCOUNTER — Telehealth: Payer: Self-pay

## 2022-02-28 NOTE — Telephone Encounter (Signed)
Pt called to cancel upcoming f/u appt on 12/5, due to pt moving out of state to [facility]. Pt will be est care down there w/new PCP after 04/01/22 due to her changing insurances as well.

## 2022-03-04 ENCOUNTER — Inpatient Hospital Stay: Admission: RE | Admit: 2022-03-04 | Payer: Medicare Other | Source: Ambulatory Visit

## 2022-03-05 ENCOUNTER — Ambulatory Visit: Payer: 59 | Admitting: Internal Medicine

## 2022-03-12 ENCOUNTER — Ambulatory Visit: Payer: 59 | Admitting: Internal Medicine

## 2022-03-14 ENCOUNTER — Ambulatory Visit: Admit: 2022-03-14 | Payer: Medicare Other | Admitting: Surgery

## 2022-03-14 SURGERY — ARTHROPLASTY, HIP, TOTAL,POSTERIOR APPROACH
Anesthesia: Choice | Site: Hip | Laterality: Right

## 2022-03-21 ENCOUNTER — Telehealth: Payer: Self-pay | Admitting: Internal Medicine

## 2022-03-21 NOTE — Telephone Encounter (Signed)
Spoke with patient she stated she moved from the area did not tell where

## 2022-04-03 ENCOUNTER — Ambulatory Visit: Payer: 59 | Admitting: Neurology

## 2022-04-19 ENCOUNTER — Other Ambulatory Visit: Payer: Self-pay | Admitting: Internal Medicine

## 2022-04-24 ENCOUNTER — Other Ambulatory Visit: Payer: Self-pay | Admitting: Neurology

## 2022-05-28 ENCOUNTER — Encounter: Payer: Self-pay | Admitting: Neurology

## 2023-10-29 ENCOUNTER — Other Ambulatory Visit: Payer: Self-pay | Admitting: Medical Genetics

## 2024-01-30 ENCOUNTER — Other Ambulatory Visit: Payer: Self-pay | Admitting: Medical Genetics

## 2024-01-30 DIAGNOSIS — Z006 Encounter for examination for normal comparison and control in clinical research program: Secondary | ICD-10-CM
